# Patient Record
Sex: Female | Born: 1945 | Race: White | Hispanic: No | Smoking: Former smoker
Health system: Southern US, Community
[De-identification: ages and names within clinical notes are randomized; demographics above are authoritative.]

## PROBLEM LIST (undated history)

## (undated) DIAGNOSIS — I471 Supraventricular tachycardia, unspecified: Secondary | ICD-10-CM

## (undated) DIAGNOSIS — M1990 Unspecified osteoarthritis, unspecified site: Secondary | ICD-10-CM

## (undated) DIAGNOSIS — F32A Depression, unspecified: Secondary | ICD-10-CM

## (undated) DIAGNOSIS — F329 Major depressive disorder, single episode, unspecified: Secondary | ICD-10-CM

## (undated) DIAGNOSIS — I509 Heart failure, unspecified: Secondary | ICD-10-CM

## (undated) DIAGNOSIS — K219 Gastro-esophageal reflux disease without esophagitis: Secondary | ICD-10-CM

## (undated) DIAGNOSIS — E785 Hyperlipidemia, unspecified: Secondary | ICD-10-CM

## (undated) DIAGNOSIS — N3281 Overactive bladder: Secondary | ICD-10-CM

## (undated) DIAGNOSIS — E079 Disorder of thyroid, unspecified: Secondary | ICD-10-CM

## (undated) DIAGNOSIS — E559 Vitamin D deficiency, unspecified: Secondary | ICD-10-CM

## (undated) DIAGNOSIS — E1169 Type 2 diabetes mellitus with other specified complication: Secondary | ICD-10-CM

## (undated) DIAGNOSIS — J449 Chronic obstructive pulmonary disease, unspecified: Secondary | ICD-10-CM

## (undated) DIAGNOSIS — I10 Essential (primary) hypertension: Secondary | ICD-10-CM

## (undated) DIAGNOSIS — E669 Obesity, unspecified: Secondary | ICD-10-CM

## (undated) HISTORY — PX: CARPAL TUNNEL RELEASE: SHX101

## (undated) HISTORY — PX: PUBOVAGINAL SLING: SHX1035

## (undated) HISTORY — DX: Unspecified osteoarthritis, unspecified site: M19.90

## (undated) HISTORY — DX: Chronic obstructive pulmonary disease, unspecified: J44.9

## (undated) HISTORY — DX: Essential (primary) hypertension: I10

## (undated) HISTORY — DX: Depression, unspecified: F32.A

## (undated) HISTORY — DX: Gastro-esophageal reflux disease without esophagitis: K21.9

## (undated) HISTORY — DX: Major depressive disorder, single episode, unspecified: F32.9

## (undated) HISTORY — DX: Hyperlipidemia, unspecified: E78.5

## (undated) HISTORY — DX: Vitamin D deficiency, unspecified: E55.9

## (undated) HISTORY — PX: TONSILLECTOMY AND ADENOIDECTOMY: SUR1326

## (undated) HISTORY — PX: EYE SURGERY: SHX253

## (undated) HISTORY — DX: Overactive bladder: N32.81

## (undated) HISTORY — DX: Disorder of thyroid, unspecified: E07.9

## (undated) HISTORY — DX: Supraventricular tachycardia, unspecified: I47.10

## (undated) HISTORY — DX: Supraventricular tachycardia: I47.1

---

## 1998-01-26 ENCOUNTER — Ambulatory Visit (HOSPITAL_COMMUNITY): Admission: RE | Admit: 1998-01-26 | Discharge: 1998-01-26 | Payer: Self-pay | Admitting: *Deleted

## 1998-01-26 ENCOUNTER — Other Ambulatory Visit: Admission: RE | Admit: 1998-01-26 | Discharge: 1998-01-26 | Payer: Self-pay | Admitting: *Deleted

## 1998-05-09 ENCOUNTER — Ambulatory Visit (HOSPITAL_COMMUNITY): Admission: RE | Admit: 1998-05-09 | Discharge: 1998-05-09 | Payer: Self-pay | Admitting: *Deleted

## 1999-02-20 ENCOUNTER — Other Ambulatory Visit: Admission: RE | Admit: 1999-02-20 | Discharge: 1999-02-20 | Payer: Self-pay | Admitting: *Deleted

## 1999-03-22 ENCOUNTER — Other Ambulatory Visit: Admission: RE | Admit: 1999-03-22 | Discharge: 1999-03-22 | Payer: Self-pay | Admitting: *Deleted

## 1999-05-22 ENCOUNTER — Ambulatory Visit (HOSPITAL_COMMUNITY): Admission: RE | Admit: 1999-05-22 | Discharge: 1999-05-22 | Payer: Self-pay | Admitting: *Deleted

## 1999-06-06 ENCOUNTER — Ambulatory Visit (HOSPITAL_COMMUNITY): Admission: RE | Admit: 1999-06-06 | Discharge: 1999-06-06 | Payer: Self-pay | Admitting: *Deleted

## 2000-02-21 ENCOUNTER — Other Ambulatory Visit: Admission: RE | Admit: 2000-02-21 | Discharge: 2000-02-21 | Payer: Self-pay | Admitting: *Deleted

## 2000-06-06 ENCOUNTER — Ambulatory Visit (HOSPITAL_COMMUNITY): Admission: RE | Admit: 2000-06-06 | Discharge: 2000-06-06 | Payer: Self-pay | Admitting: *Deleted

## 2001-06-08 ENCOUNTER — Ambulatory Visit (HOSPITAL_COMMUNITY): Admission: RE | Admit: 2001-06-08 | Discharge: 2001-06-08 | Payer: Self-pay | Admitting: Internal Medicine

## 2001-06-08 ENCOUNTER — Encounter: Payer: Self-pay | Admitting: Internal Medicine

## 2001-06-22 ENCOUNTER — Other Ambulatory Visit: Admission: RE | Admit: 2001-06-22 | Discharge: 2001-06-22 | Payer: Self-pay | Admitting: Gastroenterology

## 2001-06-22 ENCOUNTER — Encounter (INDEPENDENT_AMBULATORY_CARE_PROVIDER_SITE_OTHER): Payer: Self-pay | Admitting: Specialist

## 2002-07-01 ENCOUNTER — Encounter: Payer: Self-pay | Admitting: Internal Medicine

## 2002-07-01 ENCOUNTER — Ambulatory Visit (HOSPITAL_COMMUNITY): Admission: RE | Admit: 2002-07-01 | Discharge: 2002-07-01 | Payer: Self-pay | Admitting: Internal Medicine

## 2003-07-06 ENCOUNTER — Encounter: Payer: Self-pay | Admitting: Internal Medicine

## 2003-07-06 ENCOUNTER — Ambulatory Visit (HOSPITAL_COMMUNITY): Admission: RE | Admit: 2003-07-06 | Discharge: 2003-07-06 | Payer: Self-pay | Admitting: Internal Medicine

## 2004-03-09 ENCOUNTER — Encounter: Admission: RE | Admit: 2004-03-09 | Discharge: 2004-03-09 | Payer: Self-pay | Admitting: Internal Medicine

## 2004-03-14 ENCOUNTER — Encounter: Admission: RE | Admit: 2004-03-14 | Discharge: 2004-03-14 | Payer: Self-pay | Admitting: Internal Medicine

## 2004-03-22 ENCOUNTER — Other Ambulatory Visit: Admission: RE | Admit: 2004-03-22 | Discharge: 2004-03-22 | Payer: Self-pay | Admitting: Gynecology

## 2004-04-04 ENCOUNTER — Ambulatory Visit (HOSPITAL_COMMUNITY): Admission: RE | Admit: 2004-04-04 | Discharge: 2004-04-04 | Payer: Self-pay | Admitting: Gynecology

## 2004-04-18 ENCOUNTER — Ambulatory Visit (HOSPITAL_COMMUNITY): Admission: RE | Admit: 2004-04-18 | Discharge: 2004-04-18 | Payer: Self-pay | Admitting: Gynecology

## 2004-07-06 ENCOUNTER — Ambulatory Visit (HOSPITAL_COMMUNITY): Admission: RE | Admit: 2004-07-06 | Discharge: 2004-07-06 | Payer: Self-pay | Admitting: Internal Medicine

## 2004-08-15 ENCOUNTER — Ambulatory Visit (HOSPITAL_COMMUNITY): Admission: RE | Admit: 2004-08-15 | Discharge: 2004-08-15 | Payer: Self-pay | Admitting: Gynecology

## 2004-09-02 ENCOUNTER — Ambulatory Visit (HOSPITAL_COMMUNITY): Admission: RE | Admit: 2004-09-02 | Discharge: 2004-09-02 | Payer: Self-pay | Admitting: Gynecology

## 2004-09-25 ENCOUNTER — Ambulatory Visit: Admission: RE | Admit: 2004-09-25 | Discharge: 2004-09-25 | Payer: Self-pay | Admitting: Gynecology

## 2005-06-07 ENCOUNTER — Ambulatory Visit (HOSPITAL_COMMUNITY): Admission: RE | Admit: 2005-06-07 | Discharge: 2005-06-07 | Payer: Self-pay | Admitting: Urology

## 2005-06-07 ENCOUNTER — Ambulatory Visit (HOSPITAL_BASED_OUTPATIENT_CLINIC_OR_DEPARTMENT_OTHER): Admission: RE | Admit: 2005-06-07 | Discharge: 2005-06-07 | Payer: Self-pay | Admitting: Urology

## 2005-07-12 ENCOUNTER — Ambulatory Visit (HOSPITAL_COMMUNITY): Admission: RE | Admit: 2005-07-12 | Discharge: 2005-07-12 | Payer: Self-pay | Admitting: Internal Medicine

## 2005-07-22 ENCOUNTER — Encounter: Admission: RE | Admit: 2005-07-22 | Discharge: 2005-07-22 | Payer: Self-pay | Admitting: Internal Medicine

## 2006-07-24 ENCOUNTER — Ambulatory Visit (HOSPITAL_COMMUNITY): Admission: RE | Admit: 2006-07-24 | Discharge: 2006-07-24 | Payer: Self-pay | Admitting: Internal Medicine

## 2006-08-13 ENCOUNTER — Ambulatory Visit: Payer: Self-pay | Admitting: Gastroenterology

## 2006-08-19 ENCOUNTER — Encounter (INDEPENDENT_AMBULATORY_CARE_PROVIDER_SITE_OTHER): Payer: Self-pay | Admitting: *Deleted

## 2006-08-19 ENCOUNTER — Ambulatory Visit: Payer: Self-pay | Admitting: Gastroenterology

## 2007-09-16 ENCOUNTER — Ambulatory Visit (HOSPITAL_COMMUNITY): Admission: RE | Admit: 2007-09-16 | Discharge: 2007-09-16 | Payer: Self-pay | Admitting: Internal Medicine

## 2007-12-21 ENCOUNTER — Other Ambulatory Visit: Admission: RE | Admit: 2007-12-21 | Discharge: 2007-12-21 | Payer: Self-pay | Admitting: Gynecology

## 2007-12-23 ENCOUNTER — Ambulatory Visit (HOSPITAL_COMMUNITY): Admission: RE | Admit: 2007-12-23 | Discharge: 2007-12-23 | Payer: Self-pay | Admitting: Gynecology

## 2008-01-06 ENCOUNTER — Ambulatory Visit (HOSPITAL_COMMUNITY): Admission: RE | Admit: 2008-01-06 | Discharge: 2008-01-06 | Payer: Self-pay | Admitting: Internal Medicine

## 2008-01-07 ENCOUNTER — Ambulatory Visit: Admission: RE | Admit: 2008-01-07 | Discharge: 2008-01-07 | Payer: Self-pay | Admitting: Internal Medicine

## 2008-01-07 ENCOUNTER — Ambulatory Visit: Payer: Self-pay | Admitting: Vascular Surgery

## 2008-01-07 ENCOUNTER — Encounter: Admission: RE | Admit: 2008-01-07 | Discharge: 2008-01-07 | Payer: Self-pay | Admitting: Gynecology

## 2008-01-07 ENCOUNTER — Encounter (INDEPENDENT_AMBULATORY_CARE_PROVIDER_SITE_OTHER): Payer: Self-pay | Admitting: Internal Medicine

## 2008-12-02 ENCOUNTER — Ambulatory Visit (HOSPITAL_COMMUNITY): Admission: RE | Admit: 2008-12-02 | Discharge: 2008-12-02 | Payer: Self-pay | Admitting: Internal Medicine

## 2008-12-15 ENCOUNTER — Encounter: Admission: RE | Admit: 2008-12-15 | Discharge: 2008-12-15 | Payer: Self-pay | Admitting: Internal Medicine

## 2009-08-01 ENCOUNTER — Encounter (INDEPENDENT_AMBULATORY_CARE_PROVIDER_SITE_OTHER): Payer: Self-pay | Admitting: *Deleted

## 2009-10-23 ENCOUNTER — Inpatient Hospital Stay (HOSPITAL_COMMUNITY): Admission: EM | Admit: 2009-10-23 | Discharge: 2009-10-28 | Payer: Self-pay | Admitting: Emergency Medicine

## 2009-10-23 ENCOUNTER — Ambulatory Visit: Payer: Self-pay | Admitting: Pulmonary Disease

## 2009-10-23 ENCOUNTER — Encounter (INDEPENDENT_AMBULATORY_CARE_PROVIDER_SITE_OTHER): Payer: Self-pay | Admitting: Internal Medicine

## 2010-03-29 ENCOUNTER — Telehealth: Payer: Self-pay | Admitting: Gastroenterology

## 2010-04-06 ENCOUNTER — Encounter (INDEPENDENT_AMBULATORY_CARE_PROVIDER_SITE_OTHER): Payer: Self-pay | Admitting: *Deleted

## 2010-07-11 ENCOUNTER — Ambulatory Visit: Payer: Self-pay | Admitting: Gastroenterology

## 2010-07-11 DIAGNOSIS — Z86010 Personal history of colonic polyps: Secondary | ICD-10-CM

## 2010-07-11 DIAGNOSIS — D509 Iron deficiency anemia, unspecified: Secondary | ICD-10-CM

## 2010-07-25 ENCOUNTER — Telehealth: Payer: Self-pay | Admitting: Gastroenterology

## 2010-08-16 ENCOUNTER — Encounter: Payer: Self-pay | Admitting: Gastroenterology

## 2010-08-16 ENCOUNTER — Ambulatory Visit (HOSPITAL_COMMUNITY)
Admission: RE | Admit: 2010-08-16 | Discharge: 2010-08-16 | Payer: Self-pay | Source: Home / Self Care | Admitting: Gastroenterology

## 2010-08-16 LAB — HM COLONOSCOPY

## 2010-09-11 ENCOUNTER — Ambulatory Visit
Admission: RE | Admit: 2010-09-11 | Discharge: 2010-09-11 | Payer: Self-pay | Source: Home / Self Care | Attending: Gastroenterology | Admitting: Gastroenterology

## 2010-09-11 LAB — CONVERTED CEMR LAB
OCCULT 3: NEGATIVE
OCCULT 4: NEGATIVE
OCCULT 5: NEGATIVE

## 2010-09-17 ENCOUNTER — Encounter: Payer: Self-pay | Admitting: Gastroenterology

## 2010-10-01 ENCOUNTER — Ambulatory Visit: Admit: 2010-10-01 | Payer: Self-pay | Admitting: Gastroenterology

## 2010-10-01 ENCOUNTER — Telehealth: Payer: Self-pay | Admitting: Gastroenterology

## 2010-10-06 ENCOUNTER — Encounter: Payer: Self-pay | Admitting: Internal Medicine

## 2010-10-07 ENCOUNTER — Encounter: Payer: Self-pay | Admitting: Gynecology

## 2010-10-07 ENCOUNTER — Encounter: Payer: Self-pay | Admitting: Internal Medicine

## 2010-10-11 ENCOUNTER — Encounter: Payer: Self-pay | Admitting: Gastroenterology

## 2010-10-16 NOTE — Progress Notes (Signed)
Summary: Patient Sick  Phone Note Call from Patient Call back at Home Phone [phone]   Caller: Patient Call For: Dr. Arlyce Dice Reason for Call: Talk to Nurse Details for Reason: Pt. Sick Summary of Call: Patient has procedure scheduled for tomorrow at [facility].  On antibiotics, running a fever. Needs to know if she should still proceed or reschedule to another time.  Please call. Initial call taken by: Schuyler Amor,  July 25, 2010 10:58 AM  Follow-up for Phone Call        Patient  running fever 101 today. HA, sinus congestions.  She is on home oxygen and in a wheelchair.  States she feels much worse than she did yesterday.  She is scheculed for a endo/colon tomorrow at the hospital.  She wants to know if this needs to be rescheduled.  She is wiling to do the procedures but wants to make sure you are ok with it.  Dr Arlyce Dice please advise Follow-up by: Darcey Nora RN, CGRN,  July 25, 2010 11:06 AM  Additional Follow-up for Phone Call Additional follow up Details #1::        reschedule for a later time Additional Follow-up by: Louis Meckel MD,  July 25, 2010 4:41 PM    Additional Follow-up for Phone Call Additional follow up Details #2::    Patient  is rescheduled to 08/16/10 12:30 Follow-up by: Darcey Nora RN, CGRN,  July 25, 2010 4:51 PM

## 2010-10-16 NOTE — Procedures (Signed)
Summary: Colonoscopy   Colonoscopy  Procedure date:  08/19/2006  Findings:      Results: Polyp.  Results: Diverticulosis.       Location:  [facility].   Patient Name: Jillian Wood, Jillian Wood MRN:  Procedure Procedures: Colonoscopy CPT: 16109.    with Hot Biopsy(s)CPT: Z451292.    with polypectomy. CPT: A3573898.  Personnel: Endoscopist: Barbette Hair. Arlyce Dice, MD.  Patient Consent: Procedure, Alternatives, Risks and Benefits discussed, consent obtained, from patient.  Indications  Surveillance of: Adenomatous Polyp(s). 2002.  History  Current Medications: Patient is not currently taking Coumadin.  Allergies: Allergic to                            .  Pre-Exam Physical: Performed Aug 19, 2006. Cardio-pulmonary exam, HEENT exam , Abdominal exam, Mental status exam WNL.  Comments: Patient history reviewed/updated, physical performed prior to initiation of sedation?yes Exam Exam: Extent of exam reached: Cecum, extent intended: Cecum.  The cecum was identified by appendiceal orifice and IC valve. Time to Cecum: 00:04: 09. Time for Withdrawl: 00:22:49. Colon retroflexion performed. ASA Classification: II. Tolerance: good.  Monitoring: Pulse and BP monitoring, Oximetry used. Supplemental O2 given. at 2 Liters.  Colon Prep Used Miralax for colon prep. Prep results: fair, adequate exam.  Sedation Meds: Patient assessed and found to be appropriate for moderate (conscious) sedation. Sedation was managed by the Endoscopist. Fentanyl 75 mcg. given IV. Versed 9 mg. given IV. Benadryl 25 given IV.  Findings POLYP: Transverse Colon, Maximum size: 12 mm. sessile polyp. Procedure:  snare with cautery/saline, Polyp sent to pathology. ICD9: Colon Polyps: 211.3.  POLYP: Descending Colon, Maximum size: 5 mm. sessile polyp. Procedure:  snare with cautery/saline, sent to pathology. ICD9: Colon Polyps: 211.3.  - DIVERTICULOSIS: Descending Colon to Sigmoid  Colon. ICD9: Diverticulosis: 562.10. Comments: Scattered diverticula.  POLYP: Descending Colon, Maximum size: 5 mm. sessile polyp. Procedure:  snare with cautery/saline, sent to pathology. ICD9: Colon Polyps: 211.3.  POLYP: Descending Colon, sessile polyp. Procedure:  snare with cautery/saline, sent to pathology. ICD9: Colon Polyps: 211.3.  NORMAL EXAM: Cecum.  - MULTIPLE POLYPS: Sigmoid Colon to Rectum. minimum size 2 mm, maximum size 3 mm. Procedure:  hot biopsy, Comments: Multiple diminutive polyps.  2 were removed and submitted to r/o adenomatous versus hyperplastic changes.  NORMAL EXAM: Rectum.   Assessment Abnormal examination, see findings above.  Diagnoses: 211.3: Colon Polyps.  562.10: Diverticulosis.   Events  Unplanned Interventions: No intervention was required.  Unplanned Events: There were no complications. Plans  Post Exam Instructions: Post sedation instructions given.  Patient Education: Patient given standard instructions for: Polyps. Diverticulosis.  Disposition: After procedure patient sent to recovery. After recovery patient sent home.  Scheduling/Referral: Colonoscopy, to Barbette Hair. Arlyce Dice, MD, around Aug 19, 2009.    This report was created from the original endoscopy report, which was reviewed and signed by the above listed endoscopist.

## 2010-10-16 NOTE — Procedures (Signed)
Summary: Colonoscopy  Patient: Jillian Wood Note: All result statuses are Final unless otherwise noted.  Tests: (1) Colonoscopy (COL)   COL Colonoscopy           DONE (C)     [facility]     [address] [place], [place]  [address]           COLONOSCOPY PROCEDURE REPORT           PATIENT:  Jillian Wood, Jillian Wood  MR#:  [id number]     BIRTHDATE:  [birth year], 64 yrs. old  GENDER:  female           ENDOSCOPIST:  Barbette Hair. Arlyce Dice, MD     Referred by:           PROCEDURE DATE:  08/16/2010     PROCEDURE:  Colonoscopy with snare polypectomy     ASA CLASS:  Class III     INDICATIONS:  1) Iron deficiency anemia           MEDICATIONS:   Fentanyl 60 mcg IV, Versed 6 mg IV, Benadryl 25 mg     IV           DESCRIPTION OF PROCEDURE:   After the risks benefits and     alternatives of the procedure were thoroughly explained, informed     consent was obtained.  Digital rectal exam was performed and     revealed no abnormalities.   The Pentax Colonoscope [id number]     endoscope was introduced through the anus and advanced to the     cecum, which was identified by the ileocecal valve, without     limitations.  The quality of the prep was excellent, using     MoviPrep.  The instrument was then slowly withdrawn as the colon     was fully examined.     <<PROCEDUREIMAGES>>           FINDINGS:  Two polyps were found in the cecum (see image002 and     image003). 8mm and 3mm sessile polyps - removed via cold     polypectomy snare and submitted to pathologn  A sessile polyp was     found in the sigmoid colon. It was 1 cm in size. It was found 22     cm from the point of entry. Polyps were snared, then cauterized     with monopolar cautery. Retrieval was successful (see image013).     snare polyp  Moderate diverticulosis was found in the sigmoid     colon (see image001).  This was otherwise a normal examination of     the colon (see image004, image005, image006, image008, and  image015).   Retroflexed views in the rectum revealed no     abnormalities.    The time to cecum =  minutes. The scope was then     withdrawn (time =  10.75  min) from the patient and the procedure     completed.           COMPLICATIONS:  None           ENDOSCOPIC IMPRESSION:     1) Two polyps in the cecum     2) 1 cm sessile polyp in the sigmoid colon     3) Moderate diverticulosis in the sigmoid colon     4) Otherwise normal examination           No source of bleeding seen in colon  RECOMMENDATIONS:     1) If the polyp(s) removed today are proven to be adenomatous     (pre-cancerous) polyps, you will need a repeat colonoscopy in 5     years. Otherwise you should continue to follow colorectal cancer     screening guidelines for "routine risk" patients with colonoscopy     in 10 years.           REPEAT EXAM:   You will receive a letter from Dr. Arlyce Dice in 1-2     weeks, after reviewing the final pathology, with followup     recommendations.           ______________________________     Barbette Hair Arlyce Dice, MD           cc: Lucky Cowboy, MD           CC:           n.     REVISED:  08/16/2010 01:33 PM     eSIGNED:   Barbette Hair. Kaplan at 08/16/2010 01:33 PM           Jillian Wood, [id number]  Note: An exclamation mark (!) indicates a result that was not dispersed into the flowsheet. Document Creation Date: 08/16/2010 1:34 PM _______________________________________________________________________  (1) Order result status: Final Collection or observation date-time: 08/16/2010 13:12 Requested date-time:  Receipt date-time:  Reported date-time:  Referring Physician:   Ordering Physician: Melvia Heaps [phone]) Specimen Source:  Source: Launa Grill Order Number: [id number] Lab site:

## 2010-10-16 NOTE — Miscellaneous (Signed)
  Clinical Lists Changes  Medications: Added new medication of PEPCID 20 MG TABS (FAMOTIDINE) take 1 tab once daily - Signed Rx of PEPCID 20 MG TABS (FAMOTIDINE) take 1 tab once daily;  #30 x 2;  Signed;  Entered by: Louis Meckel MD;  Authorized by: Louis Meckel MD;  Method used: Electronically [facility]  [address] [phone]*, [address] [place], [place], [place]  [address], Ph: [phone] or [phone], Fax: [phone]    Prescriptions: PEPCID 20 MG TABS (FAMOTIDINE) take 1 tab once daily  #30 x 2   Entered and Authorized by:   Louis Meckel MD   Signed by:   Louis Meckel MD on 08/16/2010   Method used:   [facility]        [facility]  [address] [phone]* (retail)       [address]       [place], [place]  [address]       Ph: [phone] or [phone]       Fax: [phone]   RxID:   [id number]

## 2010-10-16 NOTE — Letter (Signed)
Summary: New Patient letter  [facility]  [address] [place], [place] [address]   Phone: [phone]  Fax: [phone]       04/06/2010 MRN: [id number]  Jillian Wood [address] [place], [place]  [address]  Dear Jillian Wood,  Welcome to the Gastroenterology Division at [facility].    You are scheduled to see Dr. Arlyce Dice on 05/28/2010 at 3:45pm on the 3rd floor at [facility], [address]  We ask that you try to arrive at our office 15 minutes prior to your appointment time to allow for check-in.  We would like you to complete the enclosed self-administered evaluation form prior to your visit and bring it with you on the day of your appointment.  We will review it with you.  Also, please bring a complete list of all your medications or, if you prefer, bring the medication bottles and we will list them.  Please bring your insurance card so that we may make a copy of it.  If your insurance requires a referral to see a specialist, please bring your referral form from your primary care physician.  Co-payments are due at the time of your visit and may be paid by cash, check or credit card.     Your office visit will consist of a consult with your physician (includes a physical exam), any laboratory testing he/she may order, scheduling of any necessary diagnostic testing (e.g. x-ray, ultrasound, CT-scan), and scheduling of a procedure (e.g. Endoscopy, Colonoscopy) if required.  Please allow enough time on your schedule to allow for any/all of these possibilities.    If you cannot keep your appointment, please call [phone] to cancel or reschedule prior to your appointment date.  This allows [place] the opportunity to schedule an appointment for another patient in need of care.  If you do not cancel or reschedule by 5 p.m. the business day prior to your appointment date, you will be charged a $50.00 late cancellation/no-show fee.    Thank you for  choosing [facility] for your medical needs.  We appreciate the opportunity to care for you.  Please visit [place] at our website  to learn more about our practice.                     Sincerely,                                                             The Gastroenterology Division

## 2010-10-16 NOTE — Assessment & Plan Note (Signed)
Summary: discuss colon/on o2 and mobility issue/lk   History of Present Illness Visit Type: Initial Visit Primary GI MD: Melvia Heaps MD [facility] Requesting Shray Hunley: n/a Chief Complaint: Discuss colonoscopy and motility issues History of Present Illness:   Jillian Wood is a pleasant 65 year old white female referred at the request of  Lucky Cowboy, M.D. for evaluation of anemia.  She has a history of adenomatous colon polyps.  Last exam in 2007 demonstrated a 5 mm adenoma which was removed.  Diverticulosis was also seen in the left colon.  She has no GI complaints including change of bowel habits, abdominal pain, melena or hematochezia.  Approximately1  month ago blood work demonstrated a mild anemia.  She was placed on a multivitamin contains iron.  2 days ago hemoglobin was 12.5, serum iron 75, TIBC 425 and percent saturation 18.  She apparently tested Hemoccult negative.  She is on no gastric irritants including nonsteroidals.  Patient has oxygen-dependent COPD.  She is nonambulatory.    GI Review of Systems      Denies abdominal pain, acid reflux, belching, bloating, chest pain, dysphagia with liquids, dysphagia with solids, heartburn, loss of appetite, nausea, vomiting, vomiting blood, weight loss, and  weight gain.        Denies anal fissure, black tarry stools, change in bowel habit, constipation, diarrhea, diverticulosis, fecal incontinence, heme positive stool, hemorrhoids, irritable bowel syndrome, jaundice, light color stool, liver problems, rectal bleeding, and  rectal pain. Preventive Screening-Counseling & Management  Alcohol-Tobacco     Smoking Status: quit    Current Medications (verified): 1)  Albuterol Sulfate (2.5 Mg/46ml) 0.083% Nebu (Albuterol Sulfate) .... Every 6 Hours 2)  Furosemide 20 Mg Tabs (Furosemide) .Marland Kitchen.. 1 By Mouth Once Daily 3)  Altarussin Dm 100-10 Mg/16ml Syrp (Dextromethorphan-Guaifenesin) .... Daily 4)  Ipratropium-Albuterol 0.5-2.5 (3) Mg/21ml Soln  (Ipratropium-Albuterol) .... Every 6 Hours As Needed 5)  Amlodipine Besylate 10 Mg Tabs (Amlodipine Besylate) .Marland Kitchen.. 1 By Mouth Once Daily 6)  Aspirin 81 Mg Tbec (Aspirin) .Marland Kitchen.. 1 By Mouth Once Daily 7)  Calcium 1200-1000 Mg-Unit Chew (Calcium Carbonate-Vit D-Min) .... Once Daily 8)  Levothyroxine Sodium 200 Mcg Tabs (Levothyroxine Sodium) .... Once Daily 9)  Multivitamins  Tabs (Multiple Vitamin) .Marland Kitchen.. 1 By Mouth Once Daily 10)  Pravachol 40 Mg Tabs (Pravastatin Sodium) .Marland Kitchen.. 1 By Mouth Once Daily 11)  Vitamin D (Ergocalciferol) 50000 Unit Caps (Ergocalciferol) .Marland Kitchen.. 1 By Mouth Once Daily 12)  Cymbalta 60 Mg Cpep (Duloxetine Hcl) .... Take 1 Tab Daily 13)  Magnesium 250 Mg Tabs (Magnesium) .... Take 1 Tab Daily 14)  Metformin Hcl 500 Mg Tabs (Metformin Hcl) .... Take 1 Tab Twice Daily 15)  Hydrocodone-Acetaminophen 7.5-500 Mg/41ml Soln (Hydrocodone-Acetaminophen) .... Take 1 Every 6 Hours For Pain  Allergies (verified): No Known Drug Allergies  Past History:  Past Medical History: Reviewed history from 05/25/2010 and no changes required. Diverticulosis Tubular Adenoma  2007 Hyperplastic Polyp  2007 Hypertension Hyperthyroidism tobacco abuse acute renal failure hyperglycemia/pre-diabetic  Past Surgical History: Tonsillectomy Carpel Tunnel surgery, RT wrist and LT wrist, 1980's  Family History: Grandmother and Aunt- Breast Cancer  Social History:  Occupation: Disabled Patient is a former smoker.  Daily Caffeine Use  Married Smoking Status:  quit  Review of Systems       The patient complains of arthritis/joint pain and back pain.  The patient denies blood in urine, breast changes/lumps, change in vision, confusion, cough, coughing up blood, depression-new, fainting, fatigue, fever, headaches-new, hearing problems, heart murmur, heart rhythm changes,  itching, menstrual pain, muscle pains/cramps, night sweats, nosebleeds, pregnancy symptoms, shortness of breath, skin rash,  sleeping problems, sore throat, swelling of feet/legs, swollen lymph glands, thirst - excessive , urination - excessive , urination changes/pain, urine leakage, vision changes, and voice change.         Difficulty walking.  All other systems were reviewed and were negative   Vital Signs:  Patient profile:   65 year old female Height:      65 inches Weight:      322 pounds BMI:     53.78 Pulse rate:   88 / minute BP sitting:   142 / 74  (right arm)  Vitals Entered By: Lowry Ram NCMA (July 11, 2010 1:55 PM)  Physical Exam  Additional Exam:  On physical exam she has a obese female is salmon while sitting in a wheelchair  skin: anicteric HEENT: normocephalic; PEERLA; no nasal or pharyngeal abnormalities neck: supple nodes: no cervical lymphadenopathy chest: clear to ausculatation and percussion heart: no gallops, or rubs; there is a 1-2/6 early systolic murmur heard at the left sternal border abd: soft, nontender; BS normoactive; no abdominal masses, tenderness, organomegaly rectal: deferred ext: no cynanosis, clubbing, edema skeletal: no deformities neuro: oriented x 3; no focal abnormalities    Impression & Recommendations:  Problem # 1:  IRON DEFICIENCY (ICD-280.9)  Iron studies are suggestive of iron deficiency.  It is noteworthy that these were checked after she started a multivitamin containing iron.  A chronic GI bleeding source should be ruled out.  Recommendations #1 colonoscopy and upper endoscopy has to be done at the same time  Risks, alternatives, and complications of the procedure, including bleeding, perforation, and possible need for surgery, were explained to the patient.  Patient's questions were answered.  Orders: [facility] (Col/End [place])  Problem # 2:  COPD (ICD-496) The patient has oxygen-dependent but stable COPD  Problem # 3:  PERSONAL HISTORY OF COLONIC POLYPS (ICD-V12.72)  Plan followup colonoscopy  Orders: [facility] (Col/End [place])  Patient Instructions: 1)  Your Colonoscopy/Endo is scheduled for 07/26/2010 at 12:30pm 2)  The medication list was reviewed and reconciled.  All changed / newly prescribed medications were explained.  A complete medication list was provided to the patient / caregiver. Prescriptions: MOVIPREP 100 GM  SOLR (PEG-KCL-NACL-NASULF-NA ASC-C) As per prep instructions.  #1 x 0   Entered by:   Merri Ray CMA (AAMA)   Authorized by:   Louis Meckel MD   Signed by:   Merri Ray CMA (AAMA) on 07/11/2010   Method used:   [facility]  [address] [phone][facility]ail)       [address]       [place], [place]  [address]       Ph: [phone] or [phone]       Fax: [phone]   RxID:   [id number]

## 2010-10-16 NOTE — Procedures (Signed)
Summary: Upper Endoscopy  Patient: Jillian Wood Note: All result statuses are Final unless otherwise noted.  Tests: (1) Upper Endoscopy (EGD)   EGD Upper Endoscopy       DONE     [facility]     [address] [place], [place]  [address]           ENDOSCOPY PROCEDURE REPORT           PATIENT:  Jillian, Wood  MR#:  [id number]     BIRTHDATE:  1946-07-17, 64 yrs. old  GENDER:  female           ENDOSCOPIST:  Barbette Hair. Arlyce Dice, MD     Referred by:           PROCEDURE DATE:  08/16/2010     PROCEDURE:  EGD with biopsy, [id number]     ASA CLASS:  Class III     INDICATIONS:  iron deficiency anemia           MEDICATIONS:   There was residual sedation effect present from     prior procedure., Versed 2.5 mg IV, Fentanyl 25 mcg IV,     glycopyrrolate (Robinal) 0.2 mg IV     TOPICAL ANESTHETIC:  Exactacain Spray           DESCRIPTION OF PROCEDURE:   After the risks benefits and     alternatives of the procedure were thoroughly explained, informed     consent was obtained.  The Pentax Gastroscope [id number] endoscope     was introduced through the mouth and advanced to the third portion     of the duodenum, without limitations.  The instrument was slowly     withdrawn as the mucosa was fully examined.     <<PROCEDUREIMAGES>>           Multiple erosions were found in the bulb and descending duodenum.     Multiple superficial erosions/erythema in bulb and 2nd portions.     Bx taken (see image001 and image002).  Otherwise the examination     was normal.    Retroflexed views revealed no abnormalities.    The     scope was then withdrawn from the patient and the procedure     completed.           COMPLICATIONS:  None           ENDOSCOPIC IMPRESSION:     1) Erosions, multiple in the bulb/descending duodenum     2) Otherwise normal examination           Erosive changes may account for iron deficiency anemia           RECOMMENDATIONS:     1) Begin Pepcid 20 mg qd     2) Call  office next 2-3 days to schedule an office appointment     for 4-6 weeks     3) hemmoccult stools 1 month     4) CBC 4-6 weeks           REPEAT EXAM:  No           ______________________________     Barbette Hair. Arlyce Dice, MD           CC:  Jillian Cowboy, MD           n.     Rosalie Doctor:   Barbette Hair. Kaplan at 08/16/2010 01:32 PM           [name], [id number]  Note: An exclamation mark (!) indicates a result that was not dispersed into the flowsheet. Document Creation Date: 08/16/2010 1:32 PM _______________________________________________________________________  (1) Order result status: Final Collection or observation date-time: 08/16/2010 13:25 Requested date-time:  Receipt date-time:  Reported date-time:  Referring Physician:   Ordering Physician: Melvia Heaps [phone]) Specimen Source:  Source: Launa Grill Order Number: [id number] Lab site:   Appended Document: Upper Endoscopy PT HAS AN APPOINTMENT TO F/U ON 10/01/2010 AND ORDERS IN FOR CBC ON SAME DAY WILL MAIL OUT HEMOCCULTS TO PT TODAY

## 2010-10-16 NOTE — Procedures (Signed)
Summary: Instructions for procedure/[place]  Instructions for procedure/[place]   Imported By: Sherian Rein 07/13/2010 14:23:52  _____________________________________________________________________  External Attachment:    Type:   Image     Comment:   External Document

## 2010-10-16 NOTE — Letter (Signed)
Summary: Diabetic Instructions  [facility]  [address] [place], [place] [address]   Phone: [phone]  Fax: [phone]    Jillian Wood [birth year] MRN: [id number]   x    ORAL DIABETIC MEDICATION INSTRUCTIONS  The day before your procedure:   Take your diabetic pill as you do normally  The day of your procedure:   Do not take your diabetic pill    We will check your blood sugar levels during the admission process and again in Recovery before discharging you home  ________________________________________________________________________  _  _   INSULIN (LONG ACTING) MEDICATION INSTRUCTIONS (Lantus, NPH, 70/30, Humulin, Novolin-N)   The day before your procedure:   Take  your regular evening dose    The day of your procedure:   Do not take your morning dose    _  _   INSULIN (SHORT ACTING) MEDICATION INSTRUCTIONS (Regular, Humulog, Novolog)   The day before your procedure:   Do not take your evening dose   The day of your procedure:   Do not take your morning dose   _  _   INSULIN PUMP MEDICATION INSTRUCTIONS  We will contact the physician managing your diabetic care for written dosage instructions for the day before your procedure and the day of your procedure.  Once we have received the instructions, we will contact you.

## 2010-10-16 NOTE — Progress Notes (Signed)
Summary: Schedule Colonoscopy  Phone Note Outgoing Call Call back at Home Phone [phone]   Call placed by: Harlow Mares CMA Duncan Dull),  March 29, 2010 2:33 PM Call placed to: Patient Summary of Call: No Answer, patient is due for a colonoscopy. I will try to call her back about scheduling. Initial call taken by: Harlow Mares CMA Duncan Dull),  March 29, 2010 3:00 PM  Follow-up for Phone Call        patient schedule ov with kaplan 05/28/2010. Follow-up by: Harlow Mares CMA Duncan Dull),  April 06, 2010 3:55 PM

## 2010-10-16 NOTE — Letter (Signed)
Summary: [facility] Instructions  [facility]  [address] [place], [place] [address]   Phone: [phone]  Fax: [phone]       DAISIA SLOMSKI    [birth year]    MRN: [id number]        Procedure Day /Date:THURSDAY 07/26/2010     Arrival Time:11:30AM     Procedure Time:12:30PM     Location of Procedure:                     X   [facility] ( Outpatient Registration)   PREPARATION FOR COLONOSCOPY WITH MOVIPREP   Starting 5 days prior to your procedure 07/21/2010 do not eat nuts, seeds, popcorn, corn, beans, peas,  salads, or any raw vegetables.  Do not take any fiber supplements (e.g. Metamucil, Citrucel, and Benefiber).  THE DAY BEFORE YOUR PROCEDURE         DATE: 07/25/2010  DAY:WED  1.  Drink clear liquids the entire day-NO SOLID FOOD  2.  Do not drink anything colored red or purple.  Avoid juices with pulp.  No orange juice.  3.  Drink at least 64 oz. (8 glasses) of fluid/clear liquids during the day to prevent dehydration and help the prep work efficiently.  CLEAR LIQUIDS INCLUDE: Water Jello Ice Popsicles Tea (sugar ok, no milk/cream) Powdered fruit flavored drinks Coffee (sugar ok, no milk/cream) Gatorade Juice: apple, white grape, white cranberry  Lemonade Clear bullion, consomm, broth Carbonated beverages (any kind) Strained chicken noodle soup Hard Candy                             4.  In the morning, mix first dose of MoviPrep solution:    Empty 1 Pouch A and 1 Pouch B into the disposable container    Add lukewarm drinking water to the top line of the container. Mix to dissolve    Refrigerate (mixed solution should be used within 24 hrs)  5.  Begin drinking the prep at 5:00 p.m. The MoviPrep container is divided by 4 marks.   Every 15 minutes drink the solution down to the next mark (approximately 8 oz) until the full liter is complete.   6.  Follow completed prep with 16 oz of clear liquid of your choice (Nothing red or  purple).  Continue to drink clear liquids until bedtime.  7.  Before going to bed, mix second dose of MoviPrep solution:    Empty 1 Pouch A and 1 Pouch B into the disposable container    Add lukewarm drinking water to the top line of the container. Mix to dissolve    Refrigerate  THE DAY OF YOUR PROCEDURE      DATE:07/26/2010 DAY: THURSDAY  Beginning at 7:30AMa.m. (5 hours before procedure):         1. Every 15 minutes, drink the solution down to the next mark (approx 8 oz) until the full liter is complete.  2. Follow completed prep with 16 oz. of clear liquid of your choice.    3. You may drink clear liquids until 8:30AM(2 HOURS BEFORE PROCEDURE).   MEDICATION INSTRUCTIONS  Unless otherwise instructed, you should take regular prescription medications with a small sip of water   as early as possible the morning of your procedure.        OTHER INSTRUCTIONS  You will need a responsible adult at least 65 years of age to accompany you and drive  you home.   This person must remain in the waiting room during your procedure.  Wear loose fitting clothing that is easily removed.  Leave jewelry and other valuables at home.  However, you may wish to bring a book to read or  an iPod/MP3 player to listen to music as you wait for your procedure to start.  Remove all body piercing jewelry and leave at home.  Total time from sign-in until discharge is approximately 2-3 hours.  You should go home directly after your procedure and rest.  You can resume normal activities the  day after your procedure.  The day of your procedure you should not:   Drive   Make legal decisions   Operate machinery   Drink alcohol   Return to work  You will receive specific instructions about eating, activities and medications before you leave.    The above instructions have been reviewed and explained to me by   _______________________    I fully understand and can verbalize these  instructions _____________________________ Date _________

## 2010-10-18 NOTE — Letter (Signed)
Summary: Results Letter  [facility]  [address] [place], [place] [address]   Phone: [phone]  Fax: [phone]        September 17, 2010 MRN: [id number]    [facility] [address] [place], [place]  [address]    Dear Ms. Alfredo Bach,  Your hemeoccult cards testing for microscopic bleeding were negative.  No further GI workup is required at this time.   Please continue with the recommendations that we previously discussed. Should you have any further questions or concerns, feel free to contact me.    Sincerely,  Barbette Hair. Arlyce Dice, M.D., [facility]  cc Lucky Cowboy, M.D.         Sincerely,  Louis Meckel MD  This letter has been electronically signed by your physician.  Appended Document: Results Letter letter mailed

## 2010-10-18 NOTE — Progress Notes (Signed)
Summary: Pt r/s'd appointment  ---- Converted from flag ---- ---- 10/01/2010 10:27 AM, Karna Christmas wrote: pt. r/s her appt. until 11-02-10 b/c she does not have a driver and is in a wheelchair. ------------------------------ Noted, No Charge

## 2010-11-02 ENCOUNTER — Encounter: Payer: Self-pay | Admitting: Gastroenterology

## 2010-11-02 ENCOUNTER — Ambulatory Visit (INDEPENDENT_AMBULATORY_CARE_PROVIDER_SITE_OTHER): Payer: 59 | Admitting: Gastroenterology

## 2010-11-02 DIAGNOSIS — D126 Benign neoplasm of colon, unspecified: Secondary | ICD-10-CM

## 2010-11-07 NOTE — Assessment & Plan Note (Signed)
Summary: Jillian Wood W PT   History of Present Illness Visit Type: Follow-up Visit Primary GI MD: Melvia Heaps MD [facility] Primary Provider: Lucky Cowboy, MD Requesting Provider: n/a Chief Complaint: Patient here for f/u after endoscopy/colonoscopy. She states that she is currently doing well and a blood count done 2 weeks ago with Dr Oneta Rack was normal. History of Present Illness:    Jillian Wood has returned following upper and lower endoscopy. The former demonstrated erosions that I felt was a possible source for GI blood loss. Nonbleeding adenomatous polyps  were removed in the cecum and a hyperplastic polyp was removed from the sigmoid. Followup Hemoccults were negative. She has no GI complaints.   GI Review of Systems      Denies abdominal pain, acid reflux, belching, bloating, chest pain, dysphagia with liquids, dysphagia with solids, heartburn, loss of appetite, nausea, vomiting, vomiting blood, weight loss, and  weight gain.      Reports diverticulosis.     Denies anal fissure, black tarry stools, change in bowel habit, constipation, diarrhea, fecal incontinence, heme positive stool, hemorrhoids, irritable bowel syndrome, jaundice, light color stool, liver problems, rectal bleeding, and  rectal pain. Preventive Screening-Counseling & Management  Caffeine-Diet-Exercise     Does Patient Exercise: yes    Current Medications (verified): 1)  Albuterol Sulfate (2.5 Mg/81ml) 0.083% Nebu (Albuterol Sulfate) .... Every 6 Hours 2)  Furosemide 20 Mg Tabs (Furosemide) .Marland Kitchen.. 1 By Mouth Once Daily 3)  Ipratropium-Albuterol 0.5-2.5 (3) Mg/84ml Soln (Ipratropium-Albuterol) .... Every 6 Hours As Needed 4)  Amlodipine Besylate 10 Mg Tabs (Amlodipine Besylate) .Marland Kitchen.. 1 By Mouth Once Daily 5)  Aspirin 81 Mg Tbec (Aspirin) .Marland Kitchen.. 1 By Mouth Once Daily 6)  Calcium 1200-1000 Mg-Unit Chew (Calcium Carbonate-Vit D-Min) .... Once Daily 7)  Levothyroxine Sodium 200 Mcg Tabs (Levothyroxine Sodium)  .... Once Daily 8)  Multivitamins  Tabs (Multiple Vitamin) .Marland Kitchen.. 1 By Mouth Once Daily 9)  Pravachol 40 Mg Tabs (Pravastatin Sodium) .Marland Kitchen.. 1 By Mouth Once Daily 10)  Vitamin D (Ergocalciferol) 50000 Unit Caps (Ergocalciferol) .Marland Kitchen.. 1 By Mouth Once Daily 11)  Cymbalta 60 Mg Cpep (Duloxetine Hcl) .... Take 1 Tab Daily 12)  Magnesium 250 Mg Tabs (Magnesium) .... Take 1 Tab Daily 13)  Metformin Hcl 500 Mg Tabs (Metformin Hcl) .... Take 1 Tab Twice Daily 14)  Hydrocodone-Acetaminophen 7.5-500 Mg/53ml Soln (Hydrocodone-Acetaminophen) .... Take 1 Every 6 Hours For Pain 15)  Pepcid 20 Mg Tabs (Famotidine) .... Take 1 Tab Once Daily  Allergies (verified): No Known Drug Allergies  Past History:  Past Medical History: Reviewed history from 05/25/2010 and no changes required. Diverticulosis Tubular Adenoma  2007 Hyperplastic Polyp  2007 Hypertension Hyperthyroidism tobacco abuse acute renal failure hyperglycemia/pre-diabetic  Past Surgical History: Tonsillectomy Carpel Tunnel surgery, RT wrist and LT wrist, 1980's Bladder Sling  Family History: Grandmother and Aunt- Breast Cancer No FH of Colon Cancer: Family History of Heart Disease: Father  Social History: Occupation: Disabled Patient is a former smoker. -stopped 10-18-09 Daily Caffeine Use-4 cups daily Married Patient gets regular exercise.-works arms and legs Does Patient Exercise:  yes  Review of Systems       The patient complains of anemia, arthritis/joint pain, back pain, change in vision, nosebleeds, shortness of breath, swelling of feet/legs, and urine leakage.  The patient denies allergy/sinus, anxiety-new, blood in urine, breast changes/lumps, confusion, cough, coughing up blood, depression-new, fainting, fatigue, fever, headaches-new, hearing problems, heart murmur, heart rhythm changes, itching, menstrual pain, muscle pains/cramps, night sweats, pregnancy symptoms, sleeping  problems, sore throat, thirst - excessive ,  urination - excessive , urination changes/pain, vision changes, and voice change.    Vital Signs:  Patient profile:   65 year old female Height:      65 inches Weight:      309 pounds BMI:     51.61 BSA:     2.38 Pulse rate:   80 / minute Pulse rhythm:   regular BP sitting:   140 / 78  (left arm) Cuff size:   large  Vitals Entered By: Lamona Curl CMA (AAMA) (November 02, 2010 10:25 AM)   Impression & Recommendations:  Problem # 1:  PERSONAL HISTORY OF COLONIC POLYPS (ICD-V12.72)   Followup colonoscopy in 5 years. she will require propofol because of her COPD.  Problem # 2:  IRON DEFICIENCY (ICD-280.9)  This is probably due to bleeding from erosive gastritis. She's been treated and followup Hemoccults were negative. Plan no further GI workup  Problem # 3:  COPD (ICD-496) Assessment: Comment Only  Patient Instructions: 1)  Copy sent to : Lucky Cowboy, MD 2)  Recall for colonoscopy is for 5 years 3)  Follow up as needed  4)  The medication list was reviewed and reconciled.  All changed / newly prescribed medications were explained.  A complete medication list was provided to the patient / caregiver.

## 2010-12-06 LAB — GLUCOSE, CAPILLARY
Glucose-Capillary: 134 mg/dL — ABNORMAL HIGH (ref 70–99)
Glucose-Capillary: 138 mg/dL — ABNORMAL HIGH (ref 70–99)
Glucose-Capillary: 146 mg/dL — ABNORMAL HIGH (ref 70–99)
Glucose-Capillary: 149 mg/dL — ABNORMAL HIGH (ref 70–99)
Glucose-Capillary: 151 mg/dL — ABNORMAL HIGH (ref 70–99)
Glucose-Capillary: 180 mg/dL — ABNORMAL HIGH (ref 70–99)
Glucose-Capillary: 83 mg/dL (ref 70–99)
Glucose-Capillary: 87 mg/dL (ref 70–99)

## 2010-12-06 LAB — URINALYSIS, ROUTINE W REFLEX MICROSCOPIC
Glucose, UA: NEGATIVE mg/dL
Hgb urine dipstick: NEGATIVE
Ketones, ur: 15 mg/dL — AB
pH: 5.5 (ref 5.0–8.0)

## 2010-12-06 LAB — URINALYSIS, MICROSCOPIC ONLY
Leukocytes, UA: NEGATIVE
Nitrite: NEGATIVE
Protein, ur: NEGATIVE mg/dL
Specific Gravity, Urine: 1.013 (ref 1.005–1.030)
Urobilinogen, UA: 1 mg/dL (ref 0.0–1.0)

## 2010-12-06 LAB — BLOOD GAS, ARTERIAL
Acid-Base Excess: 13.5 mmol/L — ABNORMAL HIGH (ref 0.0–2.0)
Acid-Base Excess: 14.3 mmol/L — ABNORMAL HIGH (ref 0.0–2.0)
Bicarbonate: 43.4 mEq/L — ABNORMAL HIGH (ref 20.0–24.0)
Delivery systems: POSITIVE
Delivery systems: POSITIVE
FIO2: 0.75 %
O2 Saturation: 84.9 %
Patient temperature: 97.5
Patient temperature: 98.6
TCO2: 44.3 mmol/L (ref 0–100)
TCO2: 44.5 mmol/L (ref 0–100)
pCO2 arterial: 95 mmHg (ref 35.0–45.0)
pH, Arterial: 7.235 — ABNORMAL LOW (ref 7.350–7.400)
pH, Arterial: 7.377 (ref 7.350–7.400)

## 2010-12-06 LAB — COMPREHENSIVE METABOLIC PANEL
AST: 17 U/L (ref 0–37)
BUN: 26 mg/dL — ABNORMAL HIGH (ref 6–23)
CO2: 44 mEq/L (ref 19–32)
Chloride: 89 mEq/L — ABNORMAL LOW (ref 96–112)
Creatinine, Ser: 1.09 mg/dL (ref 0.4–1.2)
GFR calc non Af Amer: 51 mL/min — ABNORMAL LOW (ref >60)
Glucose, Bld: 149 mg/dL — ABNORMAL HIGH (ref 70–99)
Total Bilirubin: 0.5 mg/dL (ref 0.3–1.2)

## 2010-12-06 LAB — URINE CULTURE
Colony Count: NO GROWTH
Culture: NO GROWTH

## 2010-12-06 LAB — CBC
HCT: 38.5 % (ref 36.0–46.0)
HCT: 40.1 % (ref 36.0–46.0)
Hemoglobin: 12.8 g/dL (ref 12.0–15.0)
MCHC: 33.8 g/dL (ref 30.0–36.0)
MCV: 90.7 fL (ref 78.0–100.0)
MCV: 91 fL (ref 78.0–100.0)
Platelets: 229 10*3/uL (ref 150–400)
Platelets: 229 10*3/uL (ref 150–400)
RBC: 4.25 MIL/uL (ref 3.87–5.11)
WBC: 8 10*3/uL (ref 4.0–10.5)
WBC: 8.1 10*3/uL (ref 4.0–10.5)
WBC: 8.2 10*3/uL (ref 4.0–10.5)

## 2010-12-06 LAB — CARDIAC PANEL(CRET KIN+CKTOT+MB+TROPI)
CK, MB: 5 ng/mL — ABNORMAL HIGH (ref 0.3–4.0)
CK, MB: 6.3 ng/mL (ref 0.3–4.0)
CK, MB: 7.5 ng/mL (ref 0.3–4.0)
Relative Index: 2.9 — ABNORMAL HIGH (ref 0.0–2.5)
Relative Index: 3.3 — ABNORMAL HIGH (ref 0.0–2.5)
Total CK: 193 U/L — ABNORMAL HIGH (ref 7–177)
Troponin I: 0.04 ng/mL (ref 0.00–0.06)
Troponin I: 0.04 ng/mL (ref 0.00–0.06)

## 2010-12-06 LAB — POCT I-STAT 3, ART BLOOD GAS (G3+)
Acid-Base Excess: 13 mmol/L — ABNORMAL HIGH (ref 0.0–2.0)
O2 Saturation: 92 %
Patient temperature: 97.3
TCO2: 47 mmol/L (ref 0–100)

## 2010-12-06 LAB — BASIC METABOLIC PANEL
BUN: 23 mg/dL (ref 6–23)
BUN: 30 mg/dL — ABNORMAL HIGH (ref 6–23)
BUN: 35 mg/dL — ABNORMAL HIGH (ref 6–23)
CO2: 39 mEq/L — ABNORMAL HIGH (ref 19–32)
CO2: 40 mEq/L — ABNORMAL HIGH (ref 19–32)
Calcium: 8.7 mg/dL (ref 8.4–10.5)
Calcium: 9.2 mg/dL (ref 8.4–10.5)
Calcium: 9.4 mg/dL (ref 8.4–10.5)
Chloride: 88 mEq/L — ABNORMAL LOW (ref 96–112)
Chloride: 93 mEq/L — ABNORMAL LOW (ref 96–112)
Creatinine, Ser: 0.9 mg/dL (ref 0.4–1.2)
Creatinine, Ser: 0.99 mg/dL (ref 0.4–1.2)
Creatinine, Ser: 1.63 mg/dL — ABNORMAL HIGH (ref 0.4–1.2)
GFR calc Af Amer: 39 mL/min — ABNORMAL LOW (ref >60)
GFR calc Af Amer: >60 mL/min (ref >60)
GFR calc non Af Amer: 57 mL/min — ABNORMAL LOW (ref >60)
GFR calc non Af Amer: >60 mL/min (ref >60)
Glucose, Bld: 102 mg/dL — ABNORMAL HIGH (ref 70–99)
Glucose, Bld: 186 mg/dL — ABNORMAL HIGH (ref 70–99)
Potassium: 3.7 mEq/L (ref 3.5–5.1)
Potassium: 4.1 mEq/L (ref 3.5–5.1)
Sodium: 139 mEq/L (ref 135–145)

## 2010-12-06 LAB — TSH: TSH: 1.308 u[IU]/mL (ref 0.350–4.500)

## 2010-12-06 LAB — MAGNESIUM
Magnesium: 2 mg/dL (ref 1.5–2.5)
Magnesium: 2.1 mg/dL (ref 1.5–2.5)

## 2010-12-06 LAB — DIFFERENTIAL
Basophils Relative: 0 % (ref 0–1)
Eosinophils Absolute: 0 10*3/uL (ref 0.0–0.7)
Neutro Abs: 6.4 10*3/uL (ref 1.7–7.7)
Neutrophils Relative %: 78 % — ABNORMAL HIGH (ref 43–77)

## 2010-12-06 LAB — POCT CARDIAC MARKERS
CKMB, poc: 5 ng/mL (ref 1.0–8.0)
Myoglobin, poc: 488 ng/mL (ref 12–200)

## 2010-12-06 LAB — TROPONIN I: Troponin I: 0.05 ng/mL (ref 0.00–0.06)

## 2010-12-06 LAB — PHOSPHORUS
Phosphorus: 3.6 mg/dL (ref 2.3–4.6)
Phosphorus: 5.4 mg/dL — ABNORMAL HIGH (ref 2.3–4.6)

## 2010-12-06 LAB — CK TOTAL AND CKMB (NOT AT ARMC): Relative Index: 2.6 — ABNORMAL HIGH (ref 0.0–2.5)

## 2010-12-06 LAB — HEMOGLOBIN A1C
Hgb A1c MFr Bld: 6 % (ref 4.6–6.1)
Mean Plasma Glucose: 126 mg/dL

## 2010-12-06 LAB — LIPID PANEL: HDL: 65 mg/dL (ref >39)

## 2010-12-06 LAB — BRAIN NATRIURETIC PEPTIDE: Pro B Natriuretic peptide (BNP): 224 pg/mL — ABNORMAL HIGH (ref 0.0–100.0)

## 2010-12-06 LAB — SODIUM, URINE, RANDOM: Sodium, Ur: 17 mEq/L

## 2011-01-15 HISTORY — PX: TRANSTHORACIC ECHOCARDIOGRAM: SHX275

## 2011-01-15 HISTORY — PX: NM MYOVIEW LTD: HXRAD82

## 2011-02-01 NOTE — Consult Note (Signed)
Jillian [name], [name]              ACCOUNT NO.:  [id number]   MEDICAL RECORD NO.:  [id number]          PATIENT TYPE:  OUT   LOCATION:  GYN                          FACILITY:  [facility]   PHYSICIAN:  De Blanch, M.D.DATE OF BIRTH:  [birth year]   DATE OF CONSULTATION:  09/25/2004  DATE OF DISCHARGE:                                   CONSULTATION   HISTORY OF PRESENT ILLNESS:  This is a 65 year old white female seen in  consultation requested by Dr. Teodora Medici regarding findings of an ovarian  cyst.  Apparently the patient had some lower abdominal pain this summer and  underwent a CT scan which showed a 3.7-cm ovarian cyst.  Ultrasound showed  this cyst to be complex with two adjacent cysts but it was not well  visualized.  At that time, the CA125 value was 8.3.  Subsequently, the  patient had a repeat ultrasound in July which showed a tubular structure  measuring 6.2 x 2.8 x 3.4 cm with some septations.  An MRI was then done in  August which showed a bilobed cystic structure measuring 5.3 x 2.6 x 3.1 cm  arising in the right ovary.  Followup MRI in December was essentially  unchanged, especially with regard to the cyst.  The patient notes that her  pain is predominantly associated with physical activity.  She has some  chronic constipation as well.  She has no other gynecologic symptoms.   PAST MEDICAL HISTORY:  Hypertension, depression, hypothyroidism,  degenerative disk disease, obesity.   PAST SURGICAL HISTORY:  Carpal tunnel syndrome repair, tonsillectomy.   CURRENT MEDICATIONS:  Atenolol, Norvasc, levothyroxine, Cymbalta,  hydroxyzine, Lipitor, Lasix, Midrin, Cozaar, multivitamins, calcium, vitamin  D, oxycodone, Naproxen, Nexium.   ALLERGIES:  No drug allergies.   PAST OBSTETRICAL HISTORY:  Gravida 3.   FAMILY HISTORY:  Negative for gynecologic, breast, or colon cancer.   SOCIAL HISTORY:  The patient is married.  She is disabled due to back pain.  She smokes one  pack per day.  She does not drink alcohol.   REVIEW OF SYSTEMS:  Essentially negative except as noted above.   PHYSICAL EXAMINATION:  Height 5 feet 5 inches, weight 302 pounds.  General:  The patient is an obese white female in no acute distress.  HEENT:  Negative.  Neck:  Supple without thyromegaly.  There is no supraclavicular  or inguinal adenopathy.  Abdomen:  Obese, soft, nontender.  No masses or  organomegaly.  The site of the hernia is noted.  Pelvic:  EG/BUS vagina and  urethra are normal. Cervix is normal.  Uterus is anterior, normal shape,  size, and consistency.  There are no adnexal masses palpable.   IMPRESSION:  Bilobed cystic mass in the right adnexa, which I do not believe  is causing any symptoms given the fact the patient's primary symptoms are in  her left lower quadrant.  The cyst seems to be unchanged over the past 6  months.  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  The patient understands these recommendations and will  return to the care of Dr. Chevis Pretty for followup.      DC/MEDQ  D:  09/25/2004  T:  09/25/2004  Job:  [id number]   cc:   Leatha Gilding. Mezer, M.D.  [address]  [place]  [place] [address]  Fax: [phone]   Telford Nab, R.N.  [phone] [address]  [place], [place] [address]

## 2011-02-01 NOTE — Op Note (Signed)
NAMESHENANDOAH, YEATS              ACCOUNT NO.:  [id number]   MEDICAL RECORD NO.:  [id number]          PATIENT TYPE:  AMB   LOCATION:  [facility]                         [facility]ACILITY:  [facility]   PHYSICIAN:  Sigmund I. Patsi Sears, M.D.DATE OF BIRTH:  1946-08-30   DATE OF PROCEDURE:  06/07/2005  DATE OF DISCHARGE:                                 OPERATIVE REPORT   PREOPERATIVE DIAGNOSIS:  Stress urinary incontinence.   POSTOPERATIVE DIAGNOSIS:  Stress urinary incontinence.   PROCEDURE:  1.  Cystourethroscopy.  2.  Transobturator sling.   SURGEON:  Dr. Jethro Bolus   ASSISTANT:  Dr. Glade Nurse   ANESTHESIA:  General endotracheal.   SPECIMENS:  None.   PROCEDURE:  The patient was identified by her wrist bracelet and brought to  room 4.  She received preoperative antibiotics and was administered general  anesthesia.  She was prepped and draped in the usual sterile fashion, taking  care to avoid compartment syndrome or peripheral neuropathy.  Next, we  inserted a Foley catheter at the level of the bladder.  Clear urine output  was obtained, and a weighted speculum was placed in her anterior vagina.  We  then placed a curved cerebellar for further exposure.  Next, we infiltrated  1% Marcaine with epinephrine over the anterior vaginal wall at the 12  o'clock position, just proximal to the urethra.  We made an incision  parallel with axis of the vagina from approximately 1 cm proximal to the  urethral meatus over 2 cm in length.  We created generous vaginal flaps  bilaterally sharply, dissecting down but not through the pubocervical  fascia.  Next, we marked skin incisions at the level of the clitoris just  lateral to the pubic rami on each side.  This was carried down sharply to  the dermis.  Next, we passed our trocar, starting with the right trocar  behind the pubis onto the surgeon's first finger, and the fornix was created  surgically and brought the trocar out lateral to the  urethra at the mid  urethral level.  We ensured there was no buttonholing of the vaginal fornix.  This was done in bilateral fashion.  Next, the sling was attached to the  trocars and with the proper orientation we removed the patient's Foley.  She  underwent pancystourethroscopy with the 70 and 12-degree lenses using 400 mL  of normal saline.  There were no mucosal abnormalities or bleeding.  There  were no foreign bodies seen throughout the mucosa, including the urethra.  Next, the cystoscope was removed.  Foley catheter was placed; bladder was  drained.  Trocars were pulled out at the level of the skin.  The sling was  tensioned appropriately leaving a right-angle clamp between the urethra and  the sling.  The tab was cut, and the plastic coating was pulled off the  sling, and the sling was retensioned, leaving right-angle clamp between the  urethra and the sling which was then removed.  The skin was then closed  after trimming the sling to the level of the skin with heavy scissors.  These incisions were  then closed with Dermabond.  The vaginal incision was  closed with  a running 2-0 Vicryl.  Vaginal packing was applied; retractors were removed.  The patient was reversed from her anesthesia which she tolerated without  complication.  Please note Dr. Jethro Bolus was present and  participated in all aspects of this case.     ______________________________  Glade Nurse, MD      Sigmund I. Patsi Sears, M.D.  Electronically Signed    MT/MEDQ  D:  06/07/2005  T:  06/07/2005  Job:  [id number]

## 2011-04-17 ENCOUNTER — Other Ambulatory Visit: Payer: Self-pay | Admitting: Internal Medicine

## 2011-04-17 DIAGNOSIS — Z1231 Encounter for screening mammogram for malignant neoplasm of breast: Secondary | ICD-10-CM

## 2011-04-19 ENCOUNTER — Ambulatory Visit: Payer: 59

## 2011-04-26 ENCOUNTER — Ambulatory Visit: Payer: 59

## 2011-05-03 ENCOUNTER — Ambulatory Visit: Payer: 59

## 2011-05-10 ENCOUNTER — Ambulatory Visit
Admission: RE | Admit: 2011-05-10 | Discharge: 2011-05-10 | Disposition: A | Discharge status: Home / Self Care | Payer: 59 | Source: Ambulatory Visit | Attending: Internal Medicine | Admitting: Internal Medicine

## 2011-05-10 DIAGNOSIS — Z1231 Encounter for screening mammogram for malignant neoplasm of breast: Secondary | ICD-10-CM

## 2011-06-11 LAB — CREATININE, SERUM
Creatinine, Ser: 0.61
GFR calc Af Amer: >60

## 2012-05-19 ENCOUNTER — Other Ambulatory Visit: Payer: Self-pay | Admitting: Internal Medicine

## 2012-05-19 ENCOUNTER — Other Ambulatory Visit (HOSPITAL_COMMUNITY): Payer: Self-pay | Admitting: Internal Medicine

## 2012-05-19 DIAGNOSIS — Z1231 Encounter for screening mammogram for malignant neoplasm of breast: Secondary | ICD-10-CM

## 2012-05-27 ENCOUNTER — Ambulatory Visit: Payer: 59

## 2012-06-03 ENCOUNTER — Ambulatory Visit: Payer: 59

## 2012-06-16 LAB — HM MAMMOGRAPHY

## 2012-06-17 ENCOUNTER — Ambulatory Visit: Payer: 59

## 2012-06-19 ENCOUNTER — Ambulatory Visit: Payer: 59

## 2012-06-23 ENCOUNTER — Ambulatory Visit: Payer: 59

## 2012-07-08 ENCOUNTER — Ambulatory Visit
Admission: RE | Admit: 2012-07-08 | Discharge: 2012-07-08 | Disposition: A | Discharge status: Home / Self Care | Payer: Medicare Other | Source: Ambulatory Visit | Attending: Internal Medicine | Admitting: Internal Medicine

## 2012-07-08 DIAGNOSIS — Z1231 Encounter for screening mammogram for malignant neoplasm of breast: Secondary | ICD-10-CM

## 2013-06-14 ENCOUNTER — Other Ambulatory Visit: Payer: Self-pay

## 2013-06-14 DIAGNOSIS — Z1231 Encounter for screening mammogram for malignant neoplasm of breast: Secondary | ICD-10-CM

## 2013-07-09 ENCOUNTER — Ambulatory Visit: Payer: Medicare Other

## 2013-08-05 ENCOUNTER — Ambulatory Visit: Payer: Medicare Other

## 2013-08-16 ENCOUNTER — Encounter: Payer: Self-pay | Admitting: Physician Assistant

## 2013-08-19 ENCOUNTER — Ambulatory Visit: Payer: Self-pay | Admitting: Physician Assistant

## 2013-08-30 ENCOUNTER — Other Ambulatory Visit: Payer: Self-pay | Admitting: Internal Medicine

## 2013-08-30 MED ORDER — LOSARTAN POTASSIUM 100 MG PO TABS: 100.0000 mg | ORAL_TABLET | Freq: Every day | ORAL | Status: DC

## 2013-09-07 ENCOUNTER — Ambulatory Visit: Payer: Medicare Other

## 2013-09-14 ENCOUNTER — Encounter: Payer: Self-pay | Admitting: Physician Assistant

## 2013-09-14 ENCOUNTER — Ambulatory Visit (INDEPENDENT_AMBULATORY_CARE_PROVIDER_SITE_OTHER): Payer: Medicare Other | Admitting: Physician Assistant

## 2013-09-14 VITALS — BP 136/68 | HR 76 | Temp 97.3°F | Resp 16 | Ht 64.0 in

## 2013-09-14 DIAGNOSIS — E669 Obesity, unspecified: Secondary | ICD-10-CM

## 2013-09-14 DIAGNOSIS — E559 Vitamin D deficiency, unspecified: Secondary | ICD-10-CM | POA: Insufficient documentation

## 2013-09-14 DIAGNOSIS — E119 Type 2 diabetes mellitus without complications: Secondary | ICD-10-CM

## 2013-09-14 DIAGNOSIS — I10 Essential (primary) hypertension: Secondary | ICD-10-CM | POA: Insufficient documentation

## 2013-09-14 DIAGNOSIS — E785 Hyperlipidemia, unspecified: Secondary | ICD-10-CM | POA: Insufficient documentation

## 2013-09-14 DIAGNOSIS — Z79899 Other long term (current) drug therapy: Secondary | ICD-10-CM

## 2013-09-14 LAB — CBC WITH DIFFERENTIAL/PLATELET
Basophils Absolute: 0 10*3/uL (ref 0.0–0.1)
Basophils Relative: 0 % (ref 0–1)
Eosinophils Absolute: 0.1 10*3/uL (ref 0.0–0.7)
Hemoglobin: 11.8 g/dL — ABNORMAL LOW (ref 12.0–15.0)
Lymphocytes Relative: 33 % (ref 12–46)
MCH: 26.2 pg (ref 26.0–34.0)
MCHC: 31.2 g/dL (ref 30.0–36.0)
Monocytes Relative: 6 % (ref 3–12)
Neutro Abs: 3.3 10*3/uL (ref 1.7–7.7)
Neutrophils Relative %: 60 % (ref 43–77)
Platelets: 210 10*3/uL (ref 150–400)
RDW: 14.6 % (ref 11.5–15.5)
WBC: 5.6 10*3/uL (ref 4.0–10.5)

## 2013-09-14 MED ORDER — AZITHROMYCIN 250 MG PO TABS: ORAL_TABLET | ORAL | Status: AC

## 2013-09-14 MED ORDER — BENZONATATE 100 MG PO CAPS: 100.0000 mg | ORAL_CAPSULE | Freq: Four times a day (QID) | ORAL | Status: DC | PRN

## 2013-09-14 MED ORDER — PREDNISONE 20 MG PO TABS: ORAL_TABLET | ORAL | Status: DC

## 2013-09-14 NOTE — Progress Notes (Signed)
HPI Patient presents for 3 month follow up with hypertension, hyperlipidemia, diabetes and vitamin D. Patient's blood pressure has been controlled at home, today their BP is BP: 136/68 mmHg Patient denies chest pain,  dizziness but has had SOB. Last week she states she had sinus congestion, fever, chills, and had to turn up her oxygen last week to a 9 and currently is on a 5. Her oxygen sat has been around 92.  She is in a wheelchair, morbidly obese and oxygen dependent.  Patient's cholesterol is diet controlled. In addition they are on Pravastatin  and denies myalgias. The cholesterol last visit was The patient has not been working on diet and exercise for Diabetes, and denies changes in vision, polys, and paresthesias. She ran out of strips and has not been checking her sugar at home.  Patient is on Vitamin D supplement.   Current Medications:  Current Outpatient Prescriptions on File Prior to Visit  Medication Sig Dispense Refill  . acetaZOLAMIDE (DIAMOX) 250 MG tablet Take 250 mg by mouth 2 (two) times daily.      Marland Kitchen ALPRAZolam (XANAX) 0.5 MG tablet Take 0.5 mg by mouth at bedtime as needed for anxiety.      Marland Kitchen aspirin 81 MG tablet Take 81 mg by mouth daily.      . DULoxetine (CYMBALTA) 60 MG capsule Take 60 mg by mouth daily.      . famotidine (PEPCID) 20 MG tablet Take 20 mg by mouth 2 (two) times daily.      . furosemide (LASIX) 40 MG tablet Take 40 mg by mouth.      . levothyroxine (SYNTHROID, LEVOTHROID) 200 MCG tablet Take 200 mcg by mouth daily before breakfast.      . losartan (COZAAR) 100 MG tablet Take 1 tablet (100 mg total) by mouth daily.  90 tablet  0  . metFORMIN (GLUCOPHAGE-XR) 500 MG 24 hr tablet Take 500 mg by mouth 4 (four) times daily - after meals and at bedtime.      . OXYGEN-HELIUM IN Inhale 4 L into the lungs.      . pravastatin (PRAVACHOL) 40 MG tablet Take 40 mg by mouth daily.       No current facility-administered medications on file prior to visit.   Medical  History:  Past Medical History  Diagnosis Date  . Hypertension   . Hyperlipidemia   . Diabetes mellitus without complication   . Obesity   . Vitamin D deficiency   . COPD (chronic obstructive pulmonary disease)   . Arthritis   . GERD (gastroesophageal reflux disease)   . Depression   . Thyroid disease   . OAB (overactive bladder)    Allergies:  Allergies  Allergen Reactions  . Ace Inhibitors     cough  . Inderal [Propranolol]     Hair loss    ROS Constitutional: Denies fever, chills, headaches, insomnia, fatigue, night sweats Eyes: Denies redness, blurred vision, diplopia, discharge, itchy, watery eyes.  ENT: Denies congestion, post nasal drip, sore throat, earache, dental pain, Tinnitus, Vertigo, Sinus pain, snoring.  Cardio: Denies chest pain, palpitations, irregular heartbeat, dyspnea, diaphoresis, orthopnea, PND, claudication, edema Respiratory: + cough, shortness of breath  Gastrointestinal: Denies dysphagia, heartburn, AB pain/ cramps, N/V, diarrhea, constipation, hematemesis, melena, hematochezia,  hemorrhoids Genitourinary: Denies dysuria, frequency, urgency, nocturia, hesitancy, discharge, hematuria, flank pain Musculoskeletal: Denies myalgia, stiffness, pain, swelling and strain/sprain. Skin: Denies pruritis, rash, changing in skin lesion Neuro: Denies Weakness, tremor, incoordination, spasms, pain Psychiatric: Denies confusion, memory  loss, sensory loss Endocrine: Denies change in weight, skin, hair change, nocturia Diabetic Polys, Denies visual blurring, hyper /hypo glycemic episodes, and paresthesia, Heme/Lymph: Denies Excessive bleeding, bruising, enlarged lymph nodes  Family history- Review and unchanged Social history- Review and unchanged Physical Exam: Filed Vitals:   09/14/13 1538  BP: 136/68  Pulse: 76  Temp: 97.3 F (36.3 C)  Resp: 16   There were no vitals filed for this visit. General Appearance: Morbidly obese, in no apparent distress. Eyes:  PERRLA, EOMs, conjunctiva no swelling or erythema Sinuses: No Frontal/maxillary tenderness ENT/Mouth: Ext aud canals clear, TMs without erythema, bulging. No erythema, swelling, or exudate on post pharynx.  Tonsils not swollen or erythematous. Hearing normal.  Neck: Supple, thyroid normal.  Respiratory: Respiratory effort difficult, BS equally decreased bilaterally with wheezing diffuse without rales, rhonchi,  or stridor.  Cardio: Decreased heart sounds secondary body habitus RRR with no mild systolic murmur without RGs.  Abdomen: Soft, obese, + BS.  Non tender, no guarding, rebound, hernias, masses. Lymphatics: Non tender without lymphadenopathy.  Musculoskeletal: Full ROM, wheelchair bound Skin: Warm, dry without rashes, lesions, ecchymosis.  Neuro: Cranial nerves intact. No cerebellar symptoms.  Psych: Awake and oriented X 3, normal affect.  Assessment and Plan:  Hypertension: Continue medication, monitor blood pressure at home.  Continue DASH diet. Cholesterol: Continue diet and exercise. Check cholesterol.  Diabetes-Continue diet and exercise. Check A1C- refill strips Vitamin D Def- check level and continue medications.  Bronchitis/COPD exacerbation- Zpak, prednisone 20, tessalon pearles.   Continue diet and meds as discussed. Further disposition pending results of labs. Discussed med's effects and SE's.    Jillian Wood 3:52 PM

## 2013-09-14 NOTE — Patient Instructions (Addendum)
 Bad carbs also include fruit juice, alcohol, and sweet tea. These are empty calories that do not signal to your brain that you are full.   Please remember the good carbs are still carbs which convert into sugar. So please measure them out no more than 1/2-1 cup of rice, oatmeal, pasta, and beans.  Veggies are however free foods! Pile them on.   I like lean protein at every meal such as chicken, turkey, pork chops, cottage cheese, etc. Just do not fry these meats and please center your meal around vegetable, the meats should be a side dish.   No all fruit is created equal. Please see the list below, the fruit at the bottom is higher in sugars than the fruit at the top   The majority of colds are caused by viruses and do not require antibiotics. Please read the rest of this hand out to learn more about the common cold and what you can do to help yourself as well as help prevent the over use of antibiotics.   COMMON COLD SIGNS AND SYMPTOMS - The common cold usually causes nasal congestion, runny nose, and sneezing. A sore throat may be present on the first day but usually resolves quickly. If a cough occurs, it generally develops on about the fourth or fifth day of symptoms, typically when congestion and runny nose are resolving  COMMON COLD COMPLICATIONS - In most cases, colds do not cause serious illness or complications. Most colds last for three to seven days, although many people continue to have symptoms (coughing, sneezing, congestion) for up to two weeks.  One of the more common complications is sinusitis, which is usually caused by viruses and rarely (about 2 percent of the time) by bacteria. Having thick or yellow to green-colored nasal discharge does not mean that bacterial sinusitis has developed; discolored nasal discharge is a normal phase of the common cold.  Lower respiratory infections, such as pneumonia or bronchitis, may develop following a cold.  Infection of the middle ear, or  otitis media, can accompany or follow a cold.  COMMON COLD TREATMENT - There is no specific treatment for the viruses that cause the common cold. Most treatments are aimed at relieving some of the symptoms of the cold, but do not shorten or cure the cold. Antibiotics are not useful for treating the common cold; antibiotics are only used to treat illnesses caused by bacteria, not viruses. Unnecessary use of antibiotics for the treatment of the common cold can cause allergic reactions, diarrhea, or other gastrointestinal symptoms in some patients.  The symptoms of a cold will resolve over time, even without any treatment. People with underlying medical conditions and those who use other over-the-counter or prescription medications should speak with their healthcare provider or pharmacist to ensure that it is safe to use these treatments. The following are treatments that may reduce the symptoms caused by the common cold.  Nasal congestion - Decongestants are good for nasal congestion- if you feel very stuffy but no mucus is coming out, this is the medication that will help you the most.  Pseudoephedrine is a decongestant that can improve nasal congestion. Although a prescription is not required, drugstores in the [place] keep pseudoephedrine behind the counter, so it must be requested from a pharmacist. If you have a heart condition or high blood pressure please use Coricidin BPH instead.   Runny nose - Antihistamines such as diphenhydramine (Benadryl), certazine (Zyrtec) which are best taking at night because they   can make you tired OR loratadine (Claritin),  fexafinadine (Allegra) help with a runny nose.   Nasal sprays such an oxymetazoline (Afrin and others) may also give temporary relief of nasal congestion. However, these sprays should never be used for more than two to three days; use for more than three days use can worsen congestion.  Nasocort is now over the counter and can help decrease a  runny nose. Please stop the medication if you have blurry vision or nose bleeds.   Sore throat and headache - Sore throat and headache are best treated with a mild pain reliever such as acetaminophen (Tylenol) or a non-steroidal anti-inflammatory agent such as ibuprofen or naproxen (Motrin or Aleve). These medications should be taken with food to prevent stomach problems. As well as gargling with warm water and salt.   Cough - Common cough medicine ingredients include guaifenesin and dextromethorphan; these are often combined with other medications in over-the-counter cold formulas. Often a cough is worse at night or first in the morning due to post nasal drip from you nose. You can try to sleep at an angle to decrease a cough.   Alternative treatments - Heated, humidified air can improve symptoms of nasal congestion and runny nose, and causes few to no side effects. A number of alternative products, including vitamin C, doubling up on your vitamin D and herbal products such as echinacea, may help. Certain products, such as nasal gels that contain zinc (eg, Zicam), have been associated with a permanent loss of smell.  Antibiotics - Antibiotics should not be used to treat an uncomplicated common cold. As noted above, colds are caused by viruses. Antibiotics treat bacterial, not viral infections. Some viruses that cause the common cold can also depress the immune system or cause swelling in the lining of the nose or airways; this can, in turn, lead to a bacterial infection. Often you need to give your body 7 days to fight off a common cold while treating the symptoms with the medications listed above. If after 7 days your symptoms are not improving, you are getting worse, you have shortness of breath, chest pain, a fever of over 103 you should seek medical help immediately.   PREVENTION IS THE BEST MEDICINE - Hand washing is an essential and highly effective way to prevent the spread of infection.    Alcohol-based hand rubs are a good alternative for disinfecting hands if a sink is not available.  Hands should be washed before preparing food and eating and after coughing, blowing the nose, or sneezing. While it is not always possible to limit contact with people who may be infected with a cold, touching the eyes, nose, or mouth after direct contact should be avoided when possible. Sneezing/coughing into the sleeve of one's clothing (at the inner elbow) is another means of containing sprays of saliva and secretions and does not contaminate the hands.     

## 2013-09-15 ENCOUNTER — Ambulatory Visit: Payer: Medicare Other

## 2013-09-15 LAB — BASIC METABOLIC PANEL WITH GFR
Calcium: 8.8 mg/dL (ref 8.4–10.5)
GFR, Est African American: >89 mL/min
GFR, Est Non African American: >89 mL/min
Glucose, Bld: 111 mg/dL — ABNORMAL HIGH (ref 70–99)
Potassium: 3.8 mEq/L (ref 3.5–5.3)
Sodium: 143 mEq/L (ref 135–145)

## 2013-09-15 LAB — LIPID PANEL
Cholesterol: 164 mg/dL (ref 0–200)
HDL: 48 mg/dL (ref >39)
LDL Cholesterol: 89 mg/dL (ref 0–99)
Triglycerides: 133 mg/dL (ref <150)
VLDL: 27 mg/dL (ref 0–40)

## 2013-09-15 LAB — HEPATIC FUNCTION PANEL
ALT: 8 U/L (ref 0–35)
Albumin: 3.2 g/dL — ABNORMAL LOW (ref 3.5–5.2)
Bilirubin, Direct: 0.1 mg/dL (ref 0.0–0.3)
Indirect Bilirubin: 0.2 mg/dL (ref 0.0–0.9)
Total Bilirubin: 0.3 mg/dL (ref 0.3–1.2)

## 2013-09-15 LAB — HEMOGLOBIN A1C: Mean Plasma Glucose: 134 mg/dL — ABNORMAL HIGH (ref <117)

## 2013-09-15 LAB — TSH: TSH: 11.73 u[IU]/mL — ABNORMAL HIGH (ref 0.350–4.500)

## 2013-09-28 ENCOUNTER — Other Ambulatory Visit: Payer: Self-pay | Admitting: Physician Assistant

## 2013-09-28 DIAGNOSIS — E872 Acidosis: Secondary | ICD-10-CM

## 2013-09-28 DIAGNOSIS — E8729 Other acidosis: Secondary | ICD-10-CM

## 2013-09-28 DIAGNOSIS — I10 Essential (primary) hypertension: Secondary | ICD-10-CM

## 2013-09-28 DIAGNOSIS — E039 Hypothyroidism, unspecified: Secondary | ICD-10-CM

## 2013-09-29 ENCOUNTER — Other Ambulatory Visit: Payer: Self-pay

## 2013-10-04 ENCOUNTER — Other Ambulatory Visit: Payer: Self-pay

## 2013-10-06 ENCOUNTER — Other Ambulatory Visit: Payer: Medicare Other

## 2013-10-06 ENCOUNTER — Ambulatory Visit
Admission: RE | Admit: 2013-10-06 | Discharge: 2013-10-06 | Disposition: A | Discharge status: Home / Self Care | Payer: Medicare Other | Source: Ambulatory Visit

## 2013-10-06 DIAGNOSIS — E8729 Other acidosis: Secondary | ICD-10-CM

## 2013-10-06 DIAGNOSIS — Z1231 Encounter for screening mammogram for malignant neoplasm of breast: Secondary | ICD-10-CM

## 2013-10-06 DIAGNOSIS — I10 Essential (primary) hypertension: Secondary | ICD-10-CM

## 2013-10-06 DIAGNOSIS — E039 Hypothyroidism, unspecified: Secondary | ICD-10-CM

## 2013-10-06 DIAGNOSIS — E872 Acidosis: Secondary | ICD-10-CM

## 2013-10-06 LAB — CBC WITH DIFFERENTIAL/PLATELET
Basophils Absolute: 0 10*3/uL (ref 0.0–0.1)
Basophils Relative: 0 % (ref 0–1)
Eosinophils Absolute: 0.1 10*3/uL (ref 0.0–0.7)
Eosinophils Relative: 1 % (ref 0–5)
HCT: 41.6 % (ref 36.0–46.0)
HEMOGLOBIN: 13 g/dL (ref 12.0–15.0)
LYMPHS ABS: 1.4 10*3/uL (ref 0.7–4.0)
Lymphocytes Relative: 22 % (ref 12–46)
MCH: 26.7 pg (ref 26.0–34.0)
MCHC: 31.3 g/dL (ref 30.0–36.0)
MCV: 85.4 fL (ref 78.0–100.0)
Monocytes Absolute: 0.4 10*3/uL (ref 0.1–1.0)
Monocytes Relative: 6 % (ref 3–12)
NEUTROS PCT: 71 % (ref 43–77)
Neutro Abs: 4.5 10*3/uL (ref 1.7–7.7)
Platelets: 159 10*3/uL (ref 150–400)
RBC: 4.87 MIL/uL (ref 3.87–5.11)
RDW: 15 % (ref 11.5–15.5)
WBC: 6.4 10*3/uL (ref 4.0–10.5)

## 2013-10-07 LAB — BASIC METABOLIC PANEL WITH GFR
BUN: 12 mg/dL (ref 6–23)
CO2: 39 mEq/L — ABNORMAL HIGH (ref 19–32)
Calcium: 9.2 mg/dL (ref 8.4–10.5)
Chloride: 99 mEq/L (ref 96–112)
Creat: 0.57 mg/dL (ref 0.50–1.10)
GFR, Est African American: >89 mL/min
GFR, Est Non African American: >89 mL/min
Glucose, Bld: 128 mg/dL — ABNORMAL HIGH (ref 70–99)
Potassium: 4.3 mEq/L (ref 3.5–5.3)
SODIUM: 142 meq/L (ref 135–145)

## 2013-10-07 LAB — TSH: TSH: 0.107 u[IU]/mL — ABNORMAL LOW (ref 0.350–4.500)

## 2013-11-06 DIAGNOSIS — Z79899 Other long term (current) drug therapy: Secondary | ICD-10-CM | POA: Insufficient documentation

## 2013-11-06 NOTE — Progress Notes (Deleted)
   Subjective:    Patient ID: Jillian Wood, female    DOB: June 28, 1946, 68 y.o.   MRN: [id number]  HPI  68 yo MWF with HTN, end stage COPD on O2 who continues to smoke who returns for F/U labs to monitor CO2 since initiating Diamox with initial good response. She aso had thyroid dose adjusted at last OV and returns for recheck of that also.   Review of Systems     Objective:   Physical ExamSeverely Over nourished and endomorphic in wheelchair        Assessment & Plan:  1. COPD   2. Unspecified hypothyroidism   3. CO2 retention   4. Encounter for long-term (current) use of other medications

## 2013-11-08 ENCOUNTER — Encounter: Payer: Self-pay | Admitting: Internal Medicine

## 2013-11-09 NOTE — Progress Notes (Signed)
Patient ID: Jillian Wood, female   DOB: 1946-06-22, 68 y.o.   MRN: [id number]

## 2013-11-15 ENCOUNTER — Encounter: Payer: Self-pay | Admitting: Internal Medicine

## 2013-11-15 ENCOUNTER — Ambulatory Visit (INDEPENDENT_AMBULATORY_CARE_PROVIDER_SITE_OTHER): Payer: Medicare Other | Admitting: Physician Assistant

## 2013-11-15 VITALS — BP 124/72 | HR 92 | Temp 97.9°F | Resp 18 | Wt 268.2 lb

## 2013-11-15 DIAGNOSIS — R079 Chest pain, unspecified: Secondary | ICD-10-CM

## 2013-11-15 DIAGNOSIS — E8729 Other acidosis: Secondary | ICD-10-CM

## 2013-11-15 DIAGNOSIS — E669 Obesity, unspecified: Secondary | ICD-10-CM

## 2013-11-15 DIAGNOSIS — E119 Type 2 diabetes mellitus without complications: Secondary | ICD-10-CM

## 2013-11-15 DIAGNOSIS — J449 Chronic obstructive pulmonary disease, unspecified: Secondary | ICD-10-CM

## 2013-11-15 DIAGNOSIS — I10 Essential (primary) hypertension: Secondary | ICD-10-CM

## 2013-11-15 DIAGNOSIS — E785 Hyperlipidemia, unspecified: Secondary | ICD-10-CM

## 2013-11-15 DIAGNOSIS — E872 Acidosis: Secondary | ICD-10-CM

## 2013-11-15 LAB — CBC WITH DIFFERENTIAL/PLATELET
BASOS ABS: 0 10*3/uL (ref 0.0–0.1)
Basophils Relative: 0 % (ref 0–1)
Eosinophils Absolute: 0.1 10*3/uL (ref 0.0–0.7)
Eosinophils Relative: 2 % (ref 0–5)
HCT: 41.8 % (ref 36.0–46.0)
Hemoglobin: 12.7 g/dL (ref 12.0–15.0)
LYMPHS PCT: 27 % (ref 12–46)
Lymphs Abs: 1.8 10*3/uL (ref 0.7–4.0)
MCH: 25.7 pg — ABNORMAL LOW (ref 26.0–34.0)
MCHC: 30.4 g/dL (ref 30.0–36.0)
MCV: 84.6 fL (ref 78.0–100.0)
Monocytes Absolute: 0.4 10*3/uL (ref 0.1–1.0)
Monocytes Relative: 6 % (ref 3–12)
NEUTROS ABS: 4.3 10*3/uL (ref 1.7–7.7)
Neutrophils Relative %: 65 % (ref 43–77)
Platelets: 244 10*3/uL (ref 150–400)
RBC: 4.94 MIL/uL (ref 3.87–5.11)
RDW: 14.7 % (ref 11.5–15.5)
WBC: 6.6 10*3/uL (ref 4.0–10.5)

## 2013-11-15 NOTE — Patient Instructions (Signed)
Nicotine inhaler What is this medicine? NICOTINE ([place]) helps people stop smoking. This medicine replaces the nicotine found in cigarettes and helps to decrease withdrawal effects. It is most effective when used in combination with a stop-smoking program. This medicine may be used for other purposes; ask your health care provider or pharmacist if you have questions. COMMON BRAND NAME(S): Nicotrol What should I tell my health care provider before I take this medicine? They need to know if you have any of these conditions: -diabetes -heart disease, angina, irregular heartbeat or previous heart attack -lung disease, including asthma -overactive thyroid -pheochromocytoma -stomach problems or ulcers -an unusual or allergic reaction to nicotine, other medicines, foods, dyes, or preservatives -pregnant or trying to get pregnant -breast-feeding How should I use this medicine? You should stop smoking completely before using the inhaler. Follow the directions carefully. Use exactly as directed. Do not use the inhaler more often than directed. Talk to your pediatrician regarding the use of this medicine in children. Special care may be needed. Overdosage: If you think you have taken too much of this medicine contact a poison control center or emergency room at once. NOTE: This medicine is only for you. Do not share this medicine with others. What if I miss a dose? If you miss a dose, use it as soon as you can. If it is almost time for your next dose, use only that dose. Do not use double or extra doses. What may interact with this medicine? -medicines for asthma -medicines for high blood pressure -medicines for mental depression This list may not describe all possible interactions. Give your health care provider a list of all the medicines, herbs, non-prescription drugs, or dietary supplements you use. Also tell them if you smoke, drink alcohol, or use illegal drugs. Some items may interact  with your medicine. What should I watch for while using this medicine? Always carry the inhaler with you. Do not smoke, chew nicotine gum, or use snuff while you are using this medicine. This reduces the chance of a nicotine overdose. If you are a diabetic and you quit smoking, the effects of insulin may be increased and you may need to reduce your insulin dose. Check with your doctor or health care professional about how you should adjust your insulin dose. What side effects may I notice from receiving this medicine? Side effects that you should report to your doctor or health care professional as soon as possible: -allergic reactions like skin rash, itching or hives, swelling of the face, lips, or tongue -breathing problems -changes in hearing -changes in vision -chest pain -cold sweats -confusion -fast, irregular heartbeat -feeling faint or lightheaded, falls -headache -increased saliva -nausea, vomiting -stomach pain -weakness Side effects that usually do not require medical attention (report to your doctor or health care professional if they continue or are bothersome): -diarrhea -dry mouth -hiccups -irritability -nervousness or restlessness -trouble sleeping or vivid dreams This list may not describe all possible side effects. Call your doctor for medical advice about side effects. You may report side effects to FDA at 1-800-FDA-1088. Where should I keep my medicine? Keep out of the reach of children. Store at room temperature below 25 degrees C (77 degrees F). Protect from heat and light. Throw away unused medicine after the expiration date. NOTE: This sheet is a summary. It may not cover all possible information. If you have questions about this medicine, talk to your doctor, pharmacist, or health care provider.  2014, [employer]vier/Gold Standard. (2010-11-06  13:02:05) Smoking Cessation Quitting smoking is important to your health and has many advantages. However, it is not  always easy to quit since nicotine is a very addictive drug. Often times, people try 3 times or more before being able to quit. This document explains the best ways for you to prepare to quit smoking. Quitting takes hard work and a lot of effort, but you can do it. ADVANTAGES OF QUITTING SMOKING  You will live longer, feel better, and live better.  Your body will feel the impact of quitting smoking almost immediately.  Within 20 minutes, blood pressure decreases. Your pulse returns to its normal level.  After 8 hours, carbon monoxide levels in the blood return to normal. Your oxygen level increases.  After 24 hours, the chance of having a heart attack starts to decrease. Your breath, hair, and body stop smelling like smoke.  After 48 hours, damaged nerve endings begin to recover. Your sense of taste and smell improve.  After 72 hours, the body is virtually free of nicotine. Your bronchial tubes relax and breathing becomes easier.  After 2 to 12 weeks, lungs can hold more air. Exercise becomes easier and circulation improves.  The risk of having a heart attack, stroke, cancer, or lung disease is greatly reduced.  After 1 year, the risk of coronary heart disease is cut in half.  After 5 years, the risk of stroke falls to the same as a nonsmoker.  After 10 years, the risk of lung cancer is cut in half and the risk of other cancers decreases significantly.  After 15 years, the risk of coronary heart disease drops, usually to the level of a nonsmoker.  If you are pregnant, quitting smoking will improve your chances of having a healthy baby.  The people you live with, especially any children, will be healthier.  You will have extra money to spend on things other than cigarettes. QUESTIONS TO THINK ABOUT BEFORE ATTEMPTING TO QUIT You may want to talk about your answers with your caregiver.  Why do you want to quit?  If you tried to quit in the past, what helped and what did  not?  What will be the most difficult situations for you after you quit? How will you plan to handle them?  Who can help you through the tough times? Your family? Friends? A caregiver?  What pleasures do you get from smoking? What ways can you still get pleasure if you quit? Here are some questions to ask your caregiver:  How can you help me to be successful at quitting?  What medicine do you think would be best for me and how should I take it?  What should I do if I need more help?  What is smoking withdrawal like? How can I get information on withdrawal? GET READY  Set a quit date.  Change your environment by getting rid of all cigarettes, ashtrays, matches, and lighters in your home, car, or work. Do not let people smoke in your home.  Review your past attempts to quit. Think about what worked and what did not. GET SUPPORT AND ENCOURAGEMENT You have a better chance of being successful if you have help. You can get support in many ways.  Tell your family, friends, and co-workers that you are going to quit and need their support. Ask them not to smoke around you.  Get individual, group, or telephone counseling and support. Programs are available at [employer] and health centers. Call your local health  department for information about programs in your area.  Spiritual beliefs and practices may help some smokers quit.  Download a "quit meter" on your computer to keep track of quit statistics, such as how long you have gone without smoking, cigarettes not smoked, and money saved.  Get a self-help book about quitting smoking and staying off of tobacco. [place] yourself from urges to smoke. Talk to someone, go for a walk, or occupy your time with a task.  Change your normal routine. Take a different route to work. Drink tea instead of coffee. Eat breakfast in a different place.  Reduce your stress. Take a hot bath, exercise, or read a  book.  Plan something enjoyable to do every day. Reward yourself for not smoking.  Explore interactive web-based programs that specialize in helping you quit. GET MEDICINE AND USE IT CORRECTLY Medicines can help you stop smoking and decrease the urge to smoke. Combining medicine with the above behavioral methods and support can greatly increase your chances of successfully quitting smoking.  Nicotine replacement therapy helps deliver nicotine to your body without the negative effects and risks of smoking. Nicotine replacement therapy includes nicotine gum, lozenges, inhalers, nasal sprays, and skin patches. Some may be available over-the-counter and others require a prescription.  Antidepressant medicine helps people abstain from smoking, but how this works is unknown. This medicine is available by prescription.  Nicotinic receptor partial agonist medicine simulates the effect of nicotine in your brain. This medicine is available by prescription. Ask your caregiver for advice about which medicines to use and how to use them based on your health history. Your caregiver will tell you what side effects to look out for if you choose to be on a medicine or therapy. Carefully read the information on the package. Do not use any other product containing nicotine while using a nicotine replacement product.  RELAPSE OR DIFFICULT SITUATIONS Most relapses occur within the first 3 months after quitting. Do not be discouraged if you start smoking again. Remember, most people try several times before finally quitting. You may have symptoms of withdrawal because your body is used to nicotine. You may crave cigarettes, be irritable, feel very hungry, cough often, get headaches, or have difficulty concentrating. The withdrawal symptoms are only temporary. They are strongest when you first quit, but they will go away within 10 14 days. To reduce the chances of relapse, try to:  Avoid drinking alcohol. Drinking lowers  your chances of successfully quitting.  Reduce the amount of caffeine you consume. Once you quit smoking, the amount of caffeine in your body increases and can give you symptoms, such as a rapid heartbeat, sweating, and anxiety.  Avoid smokers because they can make you want to smoke.  Do not let weight gain distract you. Many smokers will gain weight when they quit, usually less than 10 pounds. Eat a healthy diet and stay active. You can always lose the weight gained after you quit.  Find ways to improve your mood other than smoking. FOR MORE INFORMATION  www.smokefree.gov  Document Released: 08/27/2001 Document Revised: 03/03/2012 Document Reviewed: 12/12/2011 [facility] Patient Information 2014 [place], [place].

## 2013-11-15 NOTE — Progress Notes (Signed)
HPI Patient presents for a 3 month follow up for DM, HTN, cholesterol, obesity, COPD and continues to smoke. Patient was recently here for a 3 month follow up. She states that her breathing is better. Her Co2 was critically high likely from increasing her O2 to 9L and then 5 L. She was also diagnosed with a bronchitis and given ABX. Since that visit she states her breathing has gotten better and that she has her O2 on 4L and keeps at 92-94 and when she exertes herself she turns it up to 5 L.   Currently she is complaining of two episodes that occurred after an argument, and once during the snow. She began to have chest tightness, then felt tightness in her throat, and have left arm discomfort. She had no accompaniments, lasted for 2 hours one episode and 10 mins the other. She did not take her xanax at that time.   She is on thyroid medication. Her medication was changed last visit, one thyroid pill daily (200mg ) and 1.5 pill (300mg ) only on Sundays with an empty stomach. Patient denies diarrhea and heat / cold intolerance.  Lab Results  Component Value Date   TSH 0.107* 10/06/2013   Echo 2010 normal EF, last CXR 2009 She continues to smoke, she states that chantix is too expensive, she smokes a pack a day.    Past Medical History  Diagnosis Date  . Hypertension   . Hyperlipidemia   . Diabetes mellitus without complication   . Obesity   . Vitamin D deficiency   . COPD (chronic obstructive pulmonary disease)   . Arthritis   . GERD (gastroesophageal reflux disease)   . Depression   . Thyroid disease   . OAB (overactive bladder)      Allergies  Allergen Reactions  . Ace Inhibitors     cough  . Inderal [Propranolol]     Hair loss      Current Outpatient Prescriptions on File Prior to Visit  Medication Sig Dispense Refill  . acetaZOLAMIDE (DIAMOX) 250 MG tablet Take 250 mg by mouth 2 (two) times daily.      Marland Kitchen ALPRAZolam (XANAX) 0.5 MG tablet Take 0.5 mg by mouth at bedtime as  needed for anxiety.      Marland Kitchen aspirin 81 MG tablet Take 81 mg by mouth daily.      . benzonatate (TESSALON PERLES) 100 MG capsule Take 1 capsule (100 mg total) by mouth every 6 (six) hours as needed for cough.  90 capsule  1  . DULoxetine (CYMBALTA) 60 MG capsule Take 60 mg by mouth daily.      . famotidine (PEPCID) 20 MG tablet Take 20 mg by mouth 2 (two) times daily.      . furosemide (LASIX) 40 MG tablet Take 40 mg by mouth.      . levothyroxine (SYNTHROID, LEVOTHROID) 200 MCG tablet Take 200 mcg by mouth daily before breakfast.      . losartan (COZAAR) 100 MG tablet Take 1 tablet (100 mg total) by mouth daily.  90 tablet  0  . metFORMIN (GLUCOPHAGE-XR) 500 MG 24 hr tablet Take 500 mg by mouth 4 (four) times daily - after meals and at bedtime.      . OXYGEN-HELIUM IN Inhale 4 L into the lungs.      . pravastatin (PRAVACHOL) 40 MG tablet Take 40 mg by mouth daily.       No current facility-administered medications on file prior to visit.    ROS:  all negative expect above.   Physical: Filed Weights   11/15/13 1713  Weight: 268 lb 3.2 oz (121.655 kg)   Filed Vitals:   11/15/13 1713  BP: 124/72  Pulse: 92  Temp: 97.9 F (36.6 C)  Resp: 18   General Appearance: Well nourished, in no apparent distress. Eyes: PERRLA, EOMs. Sinuses: No Frontal/maxillary tenderness ENT/Mouth: Ext aud canals clear, normal light reflex with TMs without erythema, bulging. Post pharynx without erythema, swelling, exudate.  Respiratory: decreased breath sounds, no rales, rhonchi, wheezing.  Cardio: RRR, holosystolic murmur RSB, rubs or gallops. Peripheral pulses decreased bilaterally with 1-2 + edema.  Abdomen: Soft, obese, with bowl sounds. Nontender, no guarding, rebound. Lymphatics: Non tender without lymphadenopathy.  Musculoskeletal: Full ROM all peripheral extremities, 4/5 strength, and in wheel chair Skin: Warm, dry without rashes, lesions, ecchymosis.  Neuro: Cranial nerves intact, reflexes equal  bilaterally. Normal muscle tone, no cerebellar symptoms.  Pysch: Awake and oriented X 3, normal affect, Insight and Judgment appropriate.   Assessment and Plan: COPD- encouraged to quit smoking, get CXR, recheck BMP Smoking cessation- nicotine inhaler samples given with information, given patient assistance form for chantix CP- ? Angina but atypical CP- could be from COPD versus anxiety- can take xanax prn, and will send to cardiology.  Thyroid- recheck TSH DM- Discussed general issues about diabetes pathophysiology and management., Educational material distributed., Suggested low cholesterol diet., Encouraged aerobic exercise., Discussed foot care., Reminded to get yearly retinal exam. Hyperlipidemia- check LDL  OVER 40 minutes of exam, counseling, chart review, referral performed

## 2013-11-16 ENCOUNTER — Ambulatory Visit: Payer: Self-pay | Admitting: Internal Medicine

## 2013-11-16 ENCOUNTER — Encounter: Payer: Self-pay | Admitting: Physician Assistant

## 2013-11-16 LAB — LIPID PANEL
CHOL/HDL RATIO: 2.9 ratio
CHOLESTEROL: 165 mg/dL (ref 0–200)
HDL: 56 mg/dL (ref >39)
LDL Cholesterol: 82 mg/dL (ref 0–99)
Triglycerides: 133 mg/dL (ref <150)
VLDL: 27 mg/dL (ref 0–40)

## 2013-11-16 LAB — BASIC METABOLIC PANEL WITH GFR
BUN: 10 mg/dL (ref 6–23)
CHLORIDE: 97 meq/L (ref 96–112)
CO2: 37 mEq/L — ABNORMAL HIGH (ref 19–32)
Calcium: 9.3 mg/dL (ref 8.4–10.5)
Creat: 0.53 mg/dL (ref 0.50–1.10)
GFR, Est African American: >89 mL/min
Glucose, Bld: 95 mg/dL (ref 70–99)
POTASSIUM: 3.8 meq/L (ref 3.5–5.3)
Sodium: 144 mEq/L (ref 135–145)

## 2013-11-16 LAB — HEPATIC FUNCTION PANEL
ALT: 11 U/L (ref 0–35)
AST: 11 U/L (ref 0–37)
Albumin: 3.9 g/dL (ref 3.5–5.2)
Alkaline Phosphatase: 84 U/L (ref 39–117)
BILIRUBIN DIRECT: 0.1 mg/dL (ref 0.0–0.3)
BILIRUBIN INDIRECT: 0.2 mg/dL (ref 0.2–1.2)
TOTAL PROTEIN: 6.3 g/dL (ref 6.0–8.3)
Total Bilirubin: 0.3 mg/dL (ref 0.2–1.2)

## 2013-11-16 LAB — HEMOGLOBIN A1C
Hgb A1c MFr Bld: 6.5 % — ABNORMAL HIGH (ref <5.7)
Mean Plasma Glucose: 140 mg/dL — ABNORMAL HIGH (ref <117)

## 2013-11-16 LAB — INSULIN, FASTING: Insulin fasting, serum: 9 u[IU]/mL (ref 3–28)

## 2013-11-16 LAB — TSH: TSH: 0.095 u[IU]/mL — ABNORMAL LOW (ref 0.350–4.500)

## 2013-11-25 ENCOUNTER — Ambulatory Visit: Payer: Medicare Other | Admitting: Cardiology

## 2013-11-25 ENCOUNTER — Other Ambulatory Visit: Payer: Self-pay

## 2013-11-25 MED ORDER — LOSARTAN POTASSIUM 100 MG PO TABS: 100.0000 mg | ORAL_TABLET | Freq: Every day | ORAL | Status: DC

## 2013-12-07 ENCOUNTER — Ambulatory Visit: Payer: Medicare Other | Admitting: Internal Medicine

## 2013-12-14 ENCOUNTER — Ambulatory Visit: Payer: Medicare Other | Admitting: Cardiovascular Disease

## 2013-12-21 ENCOUNTER — Encounter: Payer: Self-pay | Admitting: Physician Assistant

## 2013-12-21 ENCOUNTER — Other Ambulatory Visit: Payer: Self-pay | Admitting: Internal Medicine

## 2013-12-21 ENCOUNTER — Ambulatory Visit (INDEPENDENT_AMBULATORY_CARE_PROVIDER_SITE_OTHER): Payer: Medicare Other | Admitting: Physician Assistant

## 2013-12-21 VITALS — BP 120/78 | HR 64 | Temp 97.9°F | Resp 16 | Wt 268.0 lb

## 2013-12-21 DIAGNOSIS — Z0000 Encounter for general adult medical examination without abnormal findings: Secondary | ICD-10-CM

## 2013-12-21 DIAGNOSIS — E039 Hypothyroidism, unspecified: Secondary | ICD-10-CM

## 2013-12-21 DIAGNOSIS — F17200 Nicotine dependence, unspecified, uncomplicated: Secondary | ICD-10-CM

## 2013-12-21 DIAGNOSIS — E2839 Other primary ovarian failure: Secondary | ICD-10-CM

## 2013-12-21 DIAGNOSIS — Z1331 Encounter for screening for depression: Secondary | ICD-10-CM

## 2013-12-21 DIAGNOSIS — E8729 Other acidosis: Secondary | ICD-10-CM

## 2013-12-21 DIAGNOSIS — E872 Acidosis: Secondary | ICD-10-CM

## 2013-12-21 MED ORDER — ACETAZOLAMIDE 250 MG PO TABS: 250.0000 mg | ORAL_TABLET | Freq: Three times a day (TID) | ORAL | Status: DC

## 2013-12-21 NOTE — Patient Instructions (Signed)

## 2013-12-21 NOTE — Progress Notes (Signed)
Subjective:   Jillian Wood is a 68 y.o. female with hisotry of DM, HTN, cholesterol, morbid obesity, COPD and continues to smoke  who presents for Medicare Annual Wellness Visit and 1 month follow up. Date of last medicare wellness visit is unknown.   Her blood pressure has been controlled at home, today their BP is BP: 120/78 mmHg She does not workout.  Last visit she had two episodes of atypical chest pain, she was suppose to get a CXR that she has not gotten and she was referred to cardiology, this has been rescheduled 2 times, now set for 01/06/2013. She states she has not had any CP since her last visit, she is taking 1/2 of the xanax daily which she states is helping with her nerves.   She is on thyroid medication. Her medication was changed last visit, 1 pill everyday and 1/2 pill on Sunday, she has not taken it today. Patient denies nervousness and palpitations.  Lab Results  Component Value Date   TSH 0.095* 11/15/2013  Last visit she was given a patient assistance paper for chantix, she forgot this at home.  She is on 4 L of continous O2,  She was recently treated for a bronchitis 3 months ago and her CO2 has been gradually coming down.  Lab Results  Component Value Date   CREATININE 0.53 11/15/2013   BUN 10 11/15/2013   NA 144 11/15/2013   K 3.8 11/15/2013   CL 97 11/15/2013   CO2 37* 11/15/2013   Echo 2010 normal EF, last CXR 2009    Names of Other Physician/Practitioners you currently use: 1. [facility]- here for primary care 2. Dr. Cheri Rous, eye doctor, last visit 12/2012, has appointment next week 3. 3 years, dentist,  Patient Care Team: Unk Pinto, MD as PCP - General (Internal Medicine)  Medication Review Current Outpatient Prescriptions on File Prior to Visit  Medication Sig Dispense Refill  . ALPRAZolam (XANAX) 0.5 MG tablet Take 0.5 mg by mouth at bedtime as needed for anxiety.      Marland Kitchen aspirin 81 MG tablet Take 81 mg by mouth  daily.      . benzonatate (TESSALON PERLES) 100 MG capsule Take 1 capsule (100 mg total) by mouth every 6 (six) hours as needed for cough.  90 capsule  1  . Cholecalciferol (VITAMIN D PO) Take 10,000 Units by mouth daily.      . DULoxetine (CYMBALTA) 60 MG capsule Take 60 mg by mouth daily.      . famotidine (PEPCID) 20 MG tablet Take 20 mg by mouth 2 (two) times daily.      . furosemide (LASIX) 40 MG tablet Take 40 mg by mouth.      Marland Kitchen guaiFENesin (MUCINEX) 600 MG 12 hr tablet Take 600 mg by mouth daily.      Marland Kitchen levothyroxine (SYNTHROID, LEVOTHROID) 200 MCG tablet Take 200 mcg by mouth daily before breakfast.      . losartan (COZAAR) 100 MG tablet Take 1 tablet (100 mg total) by mouth daily.  90 tablet  1  . Magnesium Chloride (MAGNESIUM DR PO) Take by mouth daily.      . metFORMIN (GLUCOPHAGE-XR) 500 MG 24 hr tablet Take 500 mg by mouth 4 (four) times daily - after meals and at bedtime.      . OXYGEN-HELIUM IN Inhale 4 L into the lungs.      . pravastatin (PRAVACHOL) 40 MG tablet Take 40 mg by mouth daily.  No current facility-administered medications on file prior to visit.    Current Problems (verified) Patient Active Problem List   Diagnosis Date Noted  . Encounter for long-term (current) use of other medications 11/06/2013  . Hypertension   . Hyperlipidemia   . Morbid obesity   . Vitamin D deficiency   . T2 NIDDM   . IRON DEFICIENCY 07/11/2010  . COPD 07/11/2010  . Personal history of colonic polyps 07/11/2010    Screening Tests Health Maintenance  Topic Date Due  . Zostavax  02/12/2006  . Influenza Vaccine  04/16/2014  . Mammogram  07/08/2014  . Tetanus/tdap  08/16/2018  . Colonoscopy  08/16/2020  . Pneumococcal Polysaccharide Vaccine Age 31 And Over  Completed     Immunization History  Administered Date(s) Administered  . Pneumococcal-Unspecified 05/16/2011  . Tdap 08/16/2008    Preventative care: Last colonoscopy: 2011 due 2016 Last mammogram: 09/2013  normal Last pap smear/pelvic exam: sees Dr. Carren Rang   DEXA:2005  Prior vaccinations: TD or Tdap: 2009  Influenza: 05/2013  Pneumococcal: 2012 Shingles/Zostavax: ?  History reviewed: allergies, current medications, past family history, past medical history, past social history, past surgical history and problem list  Risk Factors: Osteoporosis: postmenopausal estrogen deficiency and dietary calcium and/or vitamin D deficiency History of fracture in the past year: no  Tobacco History  Substance Use Topics  . Smoking status: Current Every Day Smoker -- 1.00 packs/day for 44 years    Types: Cigarettes  . Smokeless tobacco: Never Used  . Alcohol Use: No   She does smoke.  Are there smokers in your home (other than you)?  No  Alcohol Current alcohol use: none  Caffeine Current caffeine use: coffee 1-2 /day  Exercise Exercise limitations: The patient is experiencing exercise intolerance (due to morbid obesity and severe COPD). Current exercise: none  Nutrition/Diet Current diet: not asked  Cardiac risk factors: advanced age (older than 52 for men, 26 for women), diabetes mellitus, dyslipidemia, family history of premature cardiovascular disease, hypertension, obesity (BMI >= 30 kg/m2), sedentary lifestyle and smoking/ tobacco exposure.  Depression Screen (Note: if answer to either of the following is "Yes", a more complete depression screening is indicated)   Q1: Over the past two weeks, have you felt down, depressed or hopeless? No  Q2: Over the past two weeks, have you felt little interest or pleasure in doing things? No  Have you lost interest or pleasure in daily life? No  Do you often feel hopeless? No  Do you cry easily over simple problems? No  Activities of Daily Living In your present state of health, do you have any difficulty performing the following activities?:  Driving? Yes Managing money?  No Feeding yourself? No Getting from bed to chair? No Climbing a  flight of stairs? Yes Preparing food and eating?: No Bathing or showering? No Getting dressed: Yes Getting to the toilet? No Using the toilet:No Moving around from place to place: Yes but she uses her cane in the house and wheelchair outside of the house.  In the past year have you fallen or had a near fall?:No   Are you sexually active?  No  Do you have more than one partner?  No  Vision Difficulties: No  Hearing Difficulties: No Do you often ask people to speak up or repeat themselves? No Do you experience ringing or noises in your ears? No Do you have difficulty understanding soft or whispered voices? No  Cognition  Do you feel that you have a  problem with memory? Yes  Do you often misplace items? No  Do you feel safe at home?  Yes  Advanced directives Does patient have a [place]? No Does patient have a Living Will? No    Objective:     Vision and hearing screens reviewed.   Blood pressure 120/78, pulse 64, temperature 97.9 F (36.6 C), resp. rate 16. There is no weight on file to calculate BMI.  General appearance: alert, no distress, WD/WN,  female Cognitive Testing  Alert? Yes  Normal Appearance?Yes  Oriented to person? Yes  Place? Yes   Time? Yes  Recall of three objects?  Yes  Can perform simple calculations? Yes  Displays appropriate judgment?Yes  Can read the correct time from a watch face?Yes  General Appearance: Well nourished, in no apparent distress.  Eyes: PERRLA, EOMs.  Sinuses: No Frontal/maxillary tenderness  ENT/Mouth: Ext aud canals clear, normal light reflex with TMs without erythema, bulging. Post pharynx without erythema, swelling, exudate.  Respiratory: decreased breath sounds, no rales, rhonchi, wheezing.  Cardio: RRR, holosystolic murmur RSB, rubs or gallops. Peripheral pulses decreased bilaterally with 1-2 + edema.  Abdomen: Soft, obese, with bowl sounds. Nontender, no guarding, rebound.  Lymphatics: Non tender  without lymphadenopathy.  Musculoskeletal: Full ROM all peripheral extremities, 4/5 strength, and in wheel chair  Skin: Warm, dry without rashes, lesions, ecchymosis.  Neuro: Cranial nerves intact, reflexes equal bilaterally. Normal muscle tone, no cerebellar symptoms.  Pysch: Awake and oriented X 3, normal affect, Insight and Judgment appropriate.  Breast: defer Gyn: defer Rectal: defer   Assessment:   1. Unspecified hypothyroidism - TSH  2. CO2 retention - BASIC METABOLIC PANEL WITH GFR  3. Estrogen deficiency - DG Bone Density; Future   Plan:   During the course of the visit the patient was educated and counseled about appropriate screening and preventive services including:    Pneumococcal vaccine   Influenza vaccine  Td vaccine  Screening electrocardiogram  Screening mammography  Bone densitometry screening  Colorectal cancer screening  Diabetes screening  Glaucoma screening  Nutrition counseling   Smoking cessation counseling  Screening recommendations, referrals:  Vaccinations: Tdap vaccine not indicated Influenza vaccine next year Pneumococcal vaccine not indicated Shingles vaccine declined Hep B vaccine not indicated  Nutrition assessed and recommended  Colonoscopy not indicated Mammogram not due at this time, she is uptodate Pap smear not indicated Pelvic exam not indicated Recommended yearly ophthalmology/optometry visit for glaucoma screening and checkup Recommended yearly dental visit for hygiene and checkup Advanced directives - requested  Conditions/risks identified: BMI: Discussed weight loss, diet, and increase physical activity.  Increase physical activity: AHA recommends 150 minutes of physical activity a week.  Medications reviewed DEXA- ordered Diabetes is not at goal, ACE/ARB therapy: Yes. Urinary Incontinence is an issue: discussed non pharmacology and pharmacology options.  Fall risk: high- discussed PT, home fall  assessment, medications.   Medicare Attestation I have personally reviewed: The patient's medical and social history Their use of alcohol, tobacco or illicit drugs Their current medications and supplements The patient's functional ability including ADLs,fall risks, home safety risks, cognitive, and hearing and visual impairment Diet and physical activities Evidence for depression or mood disorders  The patient's weight, height, BMI, and visual acuity have been recorded in the chart.  I have made referrals, counseling, and provided education to the patient based on review of the above and I have provided the patient with a written personalized care plan for preventive services.  [name], PA-C   12/21/2013

## 2013-12-22 LAB — BASIC METABOLIC PANEL WITH GFR
BUN: 10 mg/dL (ref 6–23)
CHLORIDE: 94 meq/L — AB (ref 96–112)
CO2: 38 meq/L — AB (ref 19–32)
CREATININE: 0.57 mg/dL (ref 0.50–1.10)
Calcium: 9.3 mg/dL (ref 8.4–10.5)
GFR, Est African American: >89 mL/min
GFR, Est Non African American: >89 mL/min
GLUCOSE: 108 mg/dL — AB (ref 70–99)
Potassium: 4 mEq/L (ref 3.5–5.3)
Sodium: 139 mEq/L (ref 135–145)

## 2013-12-22 LAB — TSH: TSH: 0.15 u[IU]/mL — ABNORMAL LOW (ref 0.350–4.500)

## 2013-12-25 ENCOUNTER — Encounter: Payer: Self-pay | Admitting: Internal Medicine

## 2013-12-27 ENCOUNTER — Other Ambulatory Visit: Payer: Self-pay | Admitting: Internal Medicine

## 2013-12-27 DIAGNOSIS — K2100 Gastro-esophageal reflux disease with esophagitis, without bleeding: Secondary | ICD-10-CM

## 2013-12-27 MED ORDER — FAMOTIDINE 20 MG PO TABS: 20.0000 mg | ORAL_TABLET | Freq: Two times a day (BID) | ORAL | Status: DC

## 2014-01-06 ENCOUNTER — Encounter: Payer: Self-pay | Admitting: Cardiovascular Disease

## 2014-01-06 ENCOUNTER — Ambulatory Visit (INDEPENDENT_AMBULATORY_CARE_PROVIDER_SITE_OTHER): Payer: Medicare Other | Admitting: Cardiovascular Disease

## 2014-01-06 VITALS — BP 120/74 | HR 80 | Ht 65.0 in | Wt 268.0 lb

## 2014-01-06 DIAGNOSIS — R0602 Shortness of breath: Secondary | ICD-10-CM

## 2014-01-06 DIAGNOSIS — I10 Essential (primary) hypertension: Secondary | ICD-10-CM

## 2014-01-06 DIAGNOSIS — R079 Chest pain, unspecified: Secondary | ICD-10-CM

## 2014-01-06 DIAGNOSIS — E785 Hyperlipidemia, unspecified: Secondary | ICD-10-CM

## 2014-01-06 NOTE — Progress Notes (Signed)
01/06/2014 Jillian Wood   22-Oct-1945  [id number]  Primary Physician MCKEOWN,WILLIAM DAVID, MD Primary Cardiologist: Jillian Harp MD Jillian Wood   HPI:  Ms. Burrill is a very pleasant 68 year old severely overweight married Caucasian female mother of 3 children, grandmother of 2 grandchildren is accompanied by her husband was also a patient of mine. Marland Kitchen She was referred by Dr. Melford Aase for cardiovascular evaluation because of an episode of prolonged chest pressure. Her cardiovascular risk factor profile is notable for 50 pack years of tobacco abuse currently smoking one pack per day. She has treated hypertension, hyperlipidemia and non-insulin-requiring diabetes. There is no family history of heart disease. She has never had a heart attack stroke. 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 She had an episode of prolonged chest pressure several months ago with left upper extremity radiation.   Current Outpatient Prescriptions  Medication Sig Dispense Refill  . acetaZOLAMIDE (DIAMOX) 250 MG tablet Take 250 mg by mouth 2 (two) times daily.      Marland Kitchen ALPRAZolam (XANAX) 0.5 MG tablet Take 0.5 mg by mouth at bedtime as needed for anxiety.      Marland Kitchen aspirin 81 MG tablet Take 81 mg by mouth daily.      . benzonatate (TESSALON PERLES) 100 MG capsule Take 1 capsule (100 mg total) by mouth every 6 (six) hours as needed for cough.  90 capsule  1  . Cholecalciferol (VITAMIN D PO) Take 10,000 Units by mouth daily.      . DULoxetine (CYMBALTA) 60 MG capsule Take 60 mg by mouth daily.      . famotidine (PEPCID) 20 MG tablet Take 1 tablet (20 mg total) by mouth 2 (two) times daily. For acid reflux  180 tablet  99  . furosemide (LASIX) 40 MG tablet Take 40 mg by mouth.      Marland Kitchen guaiFENesin (MUCINEX) 600 MG 12 hr tablet Take 600 mg by mouth daily.      Marland Kitchen levothyroxine (SYNTHROID, LEVOTHROID) 200 MCG tablet Take 200 mcg by mouth daily before breakfast.      .  loratadine-pseudoephedrine (CLARITIN-D 24-HOUR) 10-240 MG per 24 hr tablet Take 1 tablet by mouth as needed.       Marland Kitchen losartan (COZAAR) 100 MG tablet Take 1 tablet (100 mg total) by mouth daily.  90 tablet  1  . Magnesium Chloride (MAGNESIUM DR PO) Take by mouth daily.      . metFORMIN (GLUCOPHAGE-XR) 500 MG 24 hr tablet Take 500 mg by mouth 4 (four) times daily - after meals and at bedtime.      . OXYGEN-HELIUM IN Inhale 4 L into the lungs.      . pravastatin (PRAVACHOL) 40 MG tablet Take 40 mg by mouth daily.       No current facility-administered medications for this visit.    Allergies  Allergen Reactions  . Ace Inhibitors     cough  . Inderal [Propranolol]     Hair loss    History   Social History  . Marital Status: Married    Spouse Name: N/A    Number of Children: N/A  . Years of Education: N/A   Occupational History  . Not on file.   Social History Main Topics  . Smoking status: Current Every Day Smoker -- 1.00 packs/day for 44 years    Types: Cigarettes  . Smokeless tobacco: Never Used  . Alcohol Use: No  . Drug Use: No  . Sexual  Activity: Not on file   Other Topics Concern  . Not on file   Social History Narrative  . No narrative on file     Review of Systems: General: negative for chills, fever, night sweats or weight changes.  Cardiovascular: negative for chest pain, dyspnea on exertion, edema, orthopnea, palpitations, paroxysmal nocturnal dyspnea or shortness of breath Dermatological: negative for rash Respiratory: negative for cough or wheezing Urologic: negative for hematuria Abdominal: negative for nausea, vomiting, diarrhea, bright red blood per rectum, melena, or hematemesis Neurologic: negative for visual changes, syncope, or dizziness All other systems reviewed and are otherwise negative except as noted above.    Blood pressure 120/74, pulse 80, height 5\' 5"  (1.651 m), weight 268 lb (121.564 kg).  General appearance: alert and no  distress Neck: no adenopathy, no carotid bruit, no JVD, supple, symmetrical, trachea midline and thyroid not enlarged, symmetric, no tenderness/mass/nodules Lungs: clear to auscultation bilaterally Heart: regular rate and rhythm, S1, S2 normal, no murmur, click, rub or gallop Extremities: extremities normal, atraumatic, no cyanosis or edema  EKG normal sinus rhythm at 80 without ST or T wave changes  ASSESSMENT AND PLAN:   Chest pain Patient was referred to me for evaluation of chest pain. She is a 68 year old morbidly overweight married Caucasian female with history of hypertension, hyperlipidemia, non-insulin-requiring diabetes and oxygen-dependent COPD. She had an episode of prolonged chest pressure several months ago with left upper extremity radiation. She's had no recurrent symptoms. Based on her risk factors and significant episode of chest pain and then to proceed with 2-D echocardiography suggestive of some double check murmur as well as dobutamine Myoview stress testing to rule out an ischemic etiology  Hyperlipidemia On statin therapy followed by her PCP  Hypertension Well-controlled on current medications      Jillian Harp MD [employer], [facility] 01/06/2014 4:38 PM

## 2014-01-06 NOTE — Patient Instructions (Signed)
  We will see you back in follow up after the tests  Dr Gwenlyn Found has ordered : 1.  Echocardiogram. Echocardiography is a painless test that uses sound waves to create images of your heart. It provides your doctor with information about the size and shape of your heart and how well your heart's chambers and valves are working. This procedure takes approximately one hour. There are no restrictions for this procedure.   2. Dobutamine Myoview- this is a test that looks at the blood flow to your heart muscle.  It takes approximately 2 1/2 hours. Please follow instruction sheet, as given.

## 2014-01-06 NOTE — Assessment & Plan Note (Signed)
Well-controlled on current medications 

## 2014-01-06 NOTE — Assessment & Plan Note (Signed)
On statin therapy followed by her PCP 

## 2014-01-06 NOTE — Assessment & Plan Note (Signed)
Patient was referred to me for evaluation of chest pain. She is a 68 year old morbidly overweight married Caucasian female with history of hypertension, hyperlipidemia, non-insulin-requiring diabetes and oxygen-dependent COPD. She had an episode of prolonged chest pressure several months ago with left upper extremity radiation. She's had no recurrent symptoms. Based on her risk factors and significant episode of chest pain and then to proceed with 2-D echocardiography suggestive of some double check murmur as well as dobutamine Myoview stress testing to rule out an ischemic etiology

## 2014-01-07 ENCOUNTER — Ambulatory Visit (HOSPITAL_COMMUNITY)
Admission: RE | Admit: 2014-01-07 | Discharge: 2014-01-07 | Disposition: A | Discharge status: Home / Self Care | Payer: Medicare Other | Source: Ambulatory Visit | Attending: Physician Assistant | Admitting: Physician Assistant

## 2014-01-07 DIAGNOSIS — J449 Chronic obstructive pulmonary disease, unspecified: Secondary | ICD-10-CM | POA: Insufficient documentation

## 2014-01-07 DIAGNOSIS — Z780 Asymptomatic menopausal state: Secondary | ICD-10-CM | POA: Insufficient documentation

## 2014-01-07 DIAGNOSIS — J9819 Other pulmonary collapse: Secondary | ICD-10-CM | POA: Insufficient documentation

## 2014-01-07 DIAGNOSIS — R079 Chest pain, unspecified: Secondary | ICD-10-CM

## 2014-01-07 DIAGNOSIS — F17200 Nicotine dependence, unspecified, uncomplicated: Secondary | ICD-10-CM | POA: Insufficient documentation

## 2014-01-07 DIAGNOSIS — E2839 Other primary ovarian failure: Secondary | ICD-10-CM

## 2014-01-07 DIAGNOSIS — Z13820 Encounter for screening for osteoporosis: Secondary | ICD-10-CM | POA: Insufficient documentation

## 2014-01-07 DIAGNOSIS — J4489 Other specified chronic obstructive pulmonary disease: Secondary | ICD-10-CM | POA: Insufficient documentation

## 2014-01-07 DIAGNOSIS — R0602 Shortness of breath: Secondary | ICD-10-CM | POA: Insufficient documentation

## 2014-01-20 ENCOUNTER — Telehealth (HOSPITAL_COMMUNITY): Payer: Self-pay

## 2014-01-20 ENCOUNTER — Other Ambulatory Visit: Payer: Self-pay

## 2014-01-21 ENCOUNTER — Telehealth (HOSPITAL_COMMUNITY): Payer: Self-pay

## 2014-01-25 ENCOUNTER — Ambulatory Visit (HOSPITAL_COMMUNITY)
Admission: RE | Admit: 2014-01-25 | Discharge: 2014-01-25 | Disposition: A | Discharge status: Home / Self Care | Payer: Medicare Other | Source: Ambulatory Visit | Attending: Cardiovascular Disease | Admitting: Cardiovascular Disease

## 2014-01-25 ENCOUNTER — Ambulatory Visit (INDEPENDENT_AMBULATORY_CARE_PROVIDER_SITE_OTHER): Payer: Medicare Other

## 2014-01-25 ENCOUNTER — Ambulatory Visit (HOSPITAL_BASED_OUTPATIENT_CLINIC_OR_DEPARTMENT_OTHER)
Admission: RE | Admit: 2014-01-25 | Discharge: 2014-01-25 | Disposition: A | Discharge status: Home / Self Care | Payer: Medicare Other | Source: Ambulatory Visit | Attending: Cardiovascular Disease | Admitting: Cardiovascular Disease

## 2014-01-25 DIAGNOSIS — R0602 Shortness of breath: Secondary | ICD-10-CM

## 2014-01-25 DIAGNOSIS — E039 Hypothyroidism, unspecified: Secondary | ICD-10-CM

## 2014-01-25 DIAGNOSIS — I359 Nonrheumatic aortic valve disorder, unspecified: Secondary | ICD-10-CM

## 2014-01-25 MED ORDER — DOBUTAMINE INFUSION FOR EP/ECHO/NUC (1000 MCG/ML)
0.0000 ug/kg/min | INTRAVENOUS | Status: DC
  Administered 2014-01-25: 40 ug/kg/min via INTRAVENOUS

## 2014-01-25 MED ORDER — TECHNETIUM TC 99M SESTAMIBI GENERIC - CARDIOLITE
10.0000 | Freq: Once | INTRAVENOUS | Status: AC | PRN
  Administered 2014-01-25: 10 via INTRAVENOUS

## 2014-01-25 MED ORDER — METOPROLOL TARTRATE 1 MG/ML IV SOLN
4.0000 mg | INTRAVENOUS | Status: AC | PRN
  Administered 2014-01-25: 4 mg via INTRAVENOUS

## 2014-01-25 MED ORDER — TECHNETIUM TC 99M SESTAMIBI GENERIC - CARDIOLITE
30.0000 | Freq: Once | INTRAVENOUS | Status: AC | PRN
  Administered 2014-01-25: 30 via INTRAVENOUS

## 2014-01-25 NOTE — Procedures (Addendum)
[facility] [place] [address] [place] [address] [phone]  Cardiology Nuclear Med Study  Jillian Wood is a 68 y.o. female     MRN : [id number]     DOB: June 16, 1946  Procedure Date: 01/25/2014  Nuclear Med Background Indication for Stress Test:  Evaluation for Ischemia History:  COPD and No prior NUC MPI for comparison. Cardiac Risk Factors: Hypertension, Lipids, NIDDM, Obesity and Smoker  Symptoms:  Chest Pain   Nuclear Pre-Procedure Caffeine/Decaff Intake:  8:00pm NPO After: 6:00am   IV Site: R Forearm  IV 0.9% NS with Angio Cath:  22g  Chest Size (in):  n/a IV Started by: Rolene Course, RN  Height: 5\' 5"  (1.651 m)  Cup Size: D  BMI:  Body mass index is 44.6 kg/(m^2). Weight:  268 lb (121.564 kg)   Tech Comments:  n/a    Nuclear Med Study 1 or 2 day study: 1 day  Stress Test Type:  Dobutamine  Order Authorizing Provider:  Quay Burow, MD   Resting Radionuclide: Technetium 76m Sestamibi  Resting Radionuclide Dose: 10.3 mCi   Stress Radionuclide:  Technetium 2m Sestamibi  Stress Radionuclide Dose: 30.9 mCi           Stress Protocol Rest HR: 81 Stress HR: 129  Rest BP: 104/58 Stress BP: 129/60  Exercise Time (min): n/a METS: n/a          Dose of Adenosine (mg):  n/a Dose of Lexiscan: n/a mg  Dose of Atropine (mg): n/a Dose of Dobutamine: 40 mcg/kg/min (at max HR)  Stress Test Technologist: Mellody Memos, CCT Nuclear Technologist: Otho Perl, CNMT   Rest Procedure:  Myocardial perfusion imaging was performed at rest 45 minutes following the intravenous administration of Technetium 66m Sestamibi. Stress Procedure:  The patient received IV dobutamine and no IV atropine.Technetium 49m Sestamibi was injected IV at peak heart rate and quantitative spect images were obtained after a 45 minute delay. Patient received 4 mg/4 ml IV slow push Lopressor by Rolene Course, RN per Dr. Quay Burow who remained at  bedside for administration.  Medication given due to SVT rate 222. Patient returned to baseline HR of 78.    Transient Ischemic Dilatation (Normal <1.22):  1.06 Lung/Heart Ratio (Normal <0.45):  0.26 QGS EDV:  75 ml QGS ESV:  23 ml LV Ejection Fraction: 69%        Rest ECG: NSR-RBBB  Stress ECG: Pt developed SVT with dobutamine which broke with IV BB  QPS Raw Data Images:  Normal; no motion artifact; normal heart/lung ratio. Stress Images:  Normal homogeneous uptake in all areas of the myocardium. Rest Images:  Normal homogeneous uptake in all areas of the myocardium. Subtraction (SDS):  No evidence of ischemia.  Impression Exercise Capacity:  Dobutamine study with no exercise. BP Response:  Normal blood pressure response. Clinical Symptoms:  Mild chest pain/dyspnea. ECG Impression:  SVT Comparison with Prior Nuclear Study: No images to compare  Overall Impression:  Normal stress nuclear study. and Images were nl but pt developed symptomatic SVT which reproduced her CP  LV Wall Motion:  NL LV Function; NL Wall Motion   Lorretta Harp, MD  01/25/2014 1:46 PM

## 2014-01-25 NOTE — Progress Notes (Signed)
2D Echocardiogram Complete.  01/25/2014   Deliah Boston, RDCS  Preliminary Technician Findings:  Mild Aortic Stenosis

## 2014-01-25 NOTE — Progress Notes (Signed)
Patient ID: Jillian Wood, female   DOB: 02/09/1946, 68 y.o.   MRN: [id number] Patient here today to recheck abnormal TSH. Patient is currently taking 200 mcg Levothyroxine, she takes a 1/2 pill on Monday, Wednesday and Friday and a whole pill all other days.

## 2014-01-26 ENCOUNTER — Ambulatory Visit: Payer: Self-pay

## 2014-01-26 LAB — TSH: TSH: 0.366 u[IU]/mL (ref 0.350–4.500)

## 2014-02-08 ENCOUNTER — Ambulatory Visit: Payer: Medicare Other | Admitting: Cardiovascular Disease

## 2014-02-22 ENCOUNTER — Encounter: Payer: Self-pay | Admitting: Internal Medicine

## 2014-02-22 ENCOUNTER — Ambulatory Visit (INDEPENDENT_AMBULATORY_CARE_PROVIDER_SITE_OTHER): Payer: Medicare Other | Admitting: Internal Medicine

## 2014-02-22 VITALS — BP 136/80 | HR 72 | Temp 97.7°F | Resp 18 | Ht 64.25 in | Wt 264.6 lb

## 2014-02-22 DIAGNOSIS — I10 Essential (primary) hypertension: Secondary | ICD-10-CM

## 2014-02-22 DIAGNOSIS — J449 Chronic obstructive pulmonary disease, unspecified: Secondary | ICD-10-CM

## 2014-02-22 DIAGNOSIS — Z79899 Other long term (current) drug therapy: Secondary | ICD-10-CM

## 2014-02-22 DIAGNOSIS — F17200 Nicotine dependence, unspecified, uncomplicated: Secondary | ICD-10-CM

## 2014-02-22 DIAGNOSIS — E039 Hypothyroidism, unspecified: Secondary | ICD-10-CM | POA: Insufficient documentation

## 2014-02-22 NOTE — Progress Notes (Signed)
Patient ID: Jillian Wood, female   DOB: Nov 25, 1945, 68 y.o.   MRN: [id number]    This very nice 68 yo MWF presents for 2 month follow up with Hypertension, Hyperlipidemia, Morbid Obesity,  Pre-Diabetes and Vitamin D Deficiency. Patient has end-stage O2 dependent COPD and continues to smoke despite ongoing counseling of the importance of smoking cessation in addition to avoiding exposure of her O2 to lighted cigarettes. Currently she is using O2 at flow rates of 3-4 LPM to achieve resting O2's of ~ 94-95%. Patient has been started on Diamox because of tendency toward CO2 retention.   HTN predates since the 1980's. BP has been controlled at home. Today's BP: 136/80 mmHg. Patient denies any cardiac type chest pain, palpitations, dyspnea/orthopnea/PND, dizziness, claudication, or dependent edema.   Hyperlipidemia is controlled with diet & meds. Last Lipids in Mar were at goal. Patient denies myalgias or other med SE's.  Lab Results  Component Value Date   CHOL 165 11/15/2013   HDL 56 11/15/2013   LDLCALC 82 11/15/2013   TRIG 133 11/15/2013   CHOLHDL 2.9 11/15/2013    Also, the patient has history of T2_NIDDM with A1c 6.7% in 2011 and last A1c was 6.5% in Mar 2015. Patient denies any symptoms of reactive hypoglycemia, diabetic polys, paresthesias or visual blurring.   Further, Patient has history of Vitamin D Deficiency of 9 in 2008 and last vitamin D was 57 in Dec 2014. Patient supplements vitamin D without any suspected side-effects.   Medication List   acetaZOLAMIDE 250 MG tablet  Commonly known as:  DIAMOX  Take 250 mg by mouth 2 (two) times daily.     ALPRAZolam 0.5 MG tablet  Commonly known as:  XANAX  Take 0.5 mg by mouth at bedtime as needed for anxiety.     aspirin 81 MG tablet  Take 81 mg by mouth daily.     DULoxetine 60 MG capsule  Commonly known as:  CYMBALTA  Take 60 mg by mouth daily.     famotidine 20 MG tablet  Commonly known as:  PEPCID  Take 1 tablet (20 mg total) by mouth 2  (two) times daily. For acid reflux     furosemide 40 MG tablet  Commonly known as:  LASIX  Take 40 mg by mouth.     guaiFENesin 600 MG 12 hr tablet  Commonly known as:  MUCINEX  Take 600 mg by mouth daily.     levothyroxine 200 MCG tablet  Commonly known as:  SYNTHROID, LEVOTHROID  Take 200 mcg by mouth daily before breakfast.     loratadine-pseudoephedrine 10-240 MG per 24 hr tablet  Commonly known as:  CLARITIN-D 24-hour  Take 1 tablet by mouth as needed.     losartan 100 MG tablet  Commonly known as:  COZAAR  Take 1 tablet (100 mg total) by mouth daily.     MAGNESIUM DR PO  Take by mouth daily.     metFORMIN 500 MG 24 hr tablet  Commonly known as:  GLUCOPHAGE-XR  Take 500 mg by mouth 4 (four) times daily - after meals and at bedtime.     OXYGEN-HELIUM IN  Inhale 4 L into the lungs.     pravastatin 40 MG tablet  Commonly known as:  PRAVACHOL  Take 40 mg by mouth daily.     VITAMIN D PO  Take 10,000 Units by mouth daily.      Allergies  Allergen Reactions  . Ace Inhibitors     cough  .  Inderal [Propranolol]     Hair loss   PMHx:   Past Medical History  Diagnosis Date  . Hypertension   . Hyperlipidemia   . Diabetes mellitus without complication   . Obesity   . Vitamin D deficiency   . COPD (chronic obstructive pulmonary disease)   . Arthritis   . GERD (gastroesophageal reflux disease)   . Depression   . Thyroid disease   . OAB (overactive bladder)    FHx:    Reviewed / unchanged  SHx:    Reviewed / unchanged   Systems Review: Constitutional: Denies fever, chills, wt changes, headaches, insomnia, fatigue, night sweats, change in appetite. Eyes: Denies redness, blurred vision, diplopia, discharge, itchy, watery eyes.  ENT: Denies discharge, congestion, post nasal drip, epistaxis, sore throat, earache, hearing loss, dental pain, tinnitus, vertigo, sinus pain, snoring.  CV: Denies chest pain, palpitations, irregular heartbeat, syncope, dyspnea,  diaphoresis, orthopnea, PND, claudication or edema. Respiratory: denies cough, dyspnea, DOE, pleurisy, hoarseness, laryngitis, wheezing.  Gastrointestinal: Denies dysphagia, odynophagia, heartburn, reflux, water brash, abdominal pain or cramps, nausea, vomiting, bloating, diarrhea, constipation, hematemesis, melena, hematochezia  or hemorrhoids. Genitourinary: Denies dysuria, frequency, urgency, nocturia, hesitancy, discharge, hematuria or flank pain. Musculoskeletal: Denies arthralgias, myalgias, stiffness, jt. swelling, pain, limping or strain/sprain.  Skin: Denies pruritus, rash, hives, warts, acne, eczema or change in skin lesion(s). Neuro: No weakness, tremor, incoordination, spasms, paresthesia or pain. Psychiatric: Denies confusion, memory loss or sensory loss. Endo: Denies change in weight, skin or hair change.  Heme/Lymph: No excessive bleeding, bruising or enlarged lymph nodes.   Exam:  BP 136/80  Pulse 72  Temp97.7 F   Resp 18  Ht 5' 4.25"   Wt 264 lb 9.6 oz   BMI 45.06 kg/m2  Appears well nourished - in no distress. Eyes: PERRLA, EOMs, conjunctiva no swelling or erythema. Sinuses: No frontal/maxillary tenderness ENT/Mouth: EAC's clear, TM's nl w/o erythema, bulging. Nares clear w/o erythema, swelling, exudates. Oropharynx clear without erythema or exudates. Oral hygiene is good. Tongue normal, non obstructing. Hearing intact.  Neck: Supple. Thyroid nl. Car 2+/2+ without bruits, nodes or JVD. Chest: Respirations nl with BS clear & equal w/o rales, rhonchi, wheezing or stridor.  Cor: Heart sounds normal w/ regular rate and rhythm without sig. murmurs, gallops, clicks, or rubs. Peripheral pulses normal and equal  without edema.  Abdomen: Soft & bowel sounds normal. Non-tender w/o guarding, rebound, hernias, masses, or organomegaly.  Lymphatics: Unremarkable.  Musculoskeletal: Full ROM all peripheral extremities, joint stability, 5/5 strength, and normal gait.  Skin: Warm,  dry without exposed rashes, lesions or ecchymosis apparent.  Neuro: Cranial nerves intact, reflexes equal bilaterally. Sensory-motor testing grossly intact. Tendon reflexes grossly intact.  Pysch: Alert & oriented x 3. Insight and judgement nl & appropriate. No ideations.  Assessment and Plan:  1. Hypertension - Continue monitor blood pressure at home. Continue diet/meds same.  2. Hyperlipidemia - Continue diet/meds, exercise,& lifestyle modifications. Continue monitor periodic cholesterol/liver & renal functions   3. T2_NIDDM - continue recommend prudent low glycemic diet, weight control, regular exercise, diabetic monitoring and periodic eye exams.  4. Vitamin D Deficiency - Continue supplementation.  5. End Stage O2 Dependent COPD - continue encourage smoking cessation  6. Hypothyroidism - recheck TSH after 2 recent dose changes.  Recommended regular exercise, BP monitoring, weight control, and discussed med and SE's. Recommended labs to assess and monitor clinical status. Further disposition pending results of labs.  Prognosis is guarded.

## 2014-02-22 NOTE — Patient Instructions (Signed)
Hypothyroidism The thyroid is a large gland located in the lower front of your neck. The thyroid gland helps control metabolism. Metabolism is how your body handles food. It controls metabolism with the hormone thyroxine. When this gland is underactive (hypothyroid), it produces too little hormone.  CAUSES These include:   Absence or destruction of thyroid tissue.  Goiter due to iodine deficiency.  Goiter due to medications.  Congenital defects (since birth).  Problems with the pituitary. This causes a lack of TSH (thyroid stimulating hormone). This hormone tells the thyroid to turn out more hormone. SYMPTOMS  Lethargy (feeling as though you have no energy)  Cold intolerance  Weight gain (in spite of normal food intake)  Dry skin  Coarse hair  Menstrual irregularity (if severe, may lead to infertility)  Slowing of thought processes Cardiac problems are also caused by insufficient amounts of thyroid hormone. Hypothyroidism in the newborn is cretinism, and is an extreme form. It is important that this form be treated adequately and immediately or it will lead rapidly to retarded physical and mental development. DIAGNOSIS  To prove hypothyroidism, your caregiver may do blood tests and ultrasound tests. Sometimes the signs are hidden. It may be necessary for your caregiver to watch this illness with blood tests either before or after diagnosis and treatment. TREATMENT  Low levels of thyroid hormone are increased by using synthetic thyroid hormone. This is a safe, effective treatment. It usually takes about four weeks to gain the full effects of the medication. After you have the full effect of the medication, it will generally take another four weeks for problems to leave. Your caregiver may start you on low doses. If you have had heart problems the dose may be gradually increased. It is generally not an emergency to get rapidly to normal. HOME CARE INSTRUCTIONS   Take your  medications as your caregiver suggests. Let your caregiver know of any medications you are taking or start taking. Your caregiver will help you with dosage schedules.  As your condition improves, your dosage needs may increase. It will be necessary to have continuing blood tests as suggested by your caregiver.  Report all suspected medication side effects to your caregiver. SEEK MEDICAL CARE IF: Seek medical care if you develop:  Sweating.  Tremulousness (tremors).  Anxiety.  Rapid weight loss.  Heat intolerance.  Emotional swings.  Diarrhea.  Weakness. SEEK IMMEDIATE MEDICAL CARE IF:  You develop chest pain, an irregular heart beat (palpitations), or a rapid heart beat. MAKE SURE YOU:   Understand these instructions.  Will watch your condition.  Will get help right away if you are not doing well or get worse. Document Released: 09/02/2005 Document Revised: 11/25/2011 Document Reviewed: 04/22/2008 [facility] Patient Information 2014 [employer].   Chronic Obstructive Pulmonary Disease Chronic obstructive pulmonary disease (COPD) is a common lung condition in which airflow from the lungs is limited. COPD is a general term that can be used to describe many different lung problems that limit airflow, including both chronic bronchitis and emphysema. If you have COPD, your lung function will probably never return to normal, but there are measures you can take to improve lung function and make yourself feel better.  CAUSES   Smoking (common).   Exposure to secondhand smoke.   Genetic problems.  Chronic inflammatory lung diseases or recurrent infections. SYMPTOMS   Shortness of breath, especially with physical activity.   Deep, persistent (chronic) cough with a large amount of thick mucus.   Wheezing.  Rapid breaths (tachypnea).   Gray or bluish discoloration (cyanosis) of the skin, especially in fingers, toes, or lips.   Fatigue.   Weight loss.    Frequent infections or episodes when breathing symptoms become much worse (exacerbations).   Chest tightness. DIAGNOSIS  Your healthcare provider will take a medical history and perform a physical examination to make the initial diagnosis. Additional tests for COPD may include:   Lung (pulmonary) function tests.  Chest X-ray.  CT scan.  Blood tests. TREATMENT  Treatment available to help you feel better when you have COPD include:   Inhaler and nebulizer medicines. These help manage the symptoms of COPD and make your breathing more comfortable  Supplemental oxygen. Supplemental oxygen is only helpful if you have a low oxygen level in your blood.   Exercise and physical activity. These are beneficial for nearly all people with COPD. Some people may also benefit from a pulmonary rehabilitation program. HOME CARE INSTRUCTIONS   Take all medicines (inhaled or pills) as directed by your health care provider.  Only take over-the-counter or prescription medicines for pain, fever, or discomfort as directed by your health care provider.   Avoid over-the-counter medicines or cough syrups that dry up your airway (such as antihistamines) and slow down the elimination of secretions unless instructed otherwise by your healthcare provider.   If you are a smoker, the most important thing that you can do is stop smoking. Continuing to smoke will cause further lung damage and breathing trouble. Ask your health care provider for help with quitting smoking. He or she can direct you to community resources or hospitals that provide support.  Avoid exposure to irritants such as smoke, chemicals, and fumes that aggravate your breathing.  Use oxygen therapy and pulmonary rehabilitation if directed by your health care provider. If you require home oxygen therapy, ask your healthcare provider whether you should purchase a pulse oximeter to measure your oxygen level at home.   Avoid contact with  individuals who have a contagious illness.  Avoid extreme temperature and humidity changes.  Eat healthy foods. Eating smaller, more frequent meals and resting before meals may help you maintain your strength.  Stay active, but balance activity with periods of rest. Exercise and physical activity will help you maintain your ability to do things you want to do.  Preventing infection and hospitalization is very important when you have COPD. Make sure to receive all the vaccines your health care provider recommends, especially the pneumococcal and influenza vaccines. Ask your healthcare provider whether you need a pneumonia vaccine.  Learn and use relaxation techniques to manage stress.  Learn and use controlled breathing techniques as directed by your health care provider. Controlled breathing techniques include:   Pursed lip breathing. Start by breathing in (inhaling) through your nose for 1 second. Then, purse your lips as if you were going to whistle and breathe out (exhale) through the pursed lips for 2 seconds.   Diaphragmatic breathing. Start by putting one hand on your abdomen just above your waist. Inhale slowly through your nose. The hand on your abdomen should move out. Then purse your lips and exhale slowly. You should be able to feel the hand on your abdomen moving in as you exhale.   Learn and use controlled coughing to clear mucus from your lungs. Controlled coughing is a series of short, progressive coughs. The steps of controlled coughing are:  1. Lean your head slightly forward.  2. Breathe in deeply using diaphragmatic breathing.  3. Try to hold your breath for 3 seconds.  4. Keep your mouth slightly open while coughing twice.  5. Spit any mucus out into a tissue.  6. Rest and repeat the steps once or twice as needed. SEEK MEDICAL CARE IF:   You are coughing up more mucus than usual.   There is a change in the color or thickness of your mucus.   Your  breathing is more labored than usual.   Your breathing is faster than usual.  SEEK IMMEDIATE MEDICAL CARE IF:   You have shortness of breath while you are resting.   You have shortness of breath that prevents you from:  Being able to talk.   Performing your usual physical activities.   You have chest pain lasting longer than 5 minutes.   Your skin color is more cyanotic than usual.  You measure low oxygen saturations for longer than 5 minutes with a pulse oximeter. MAKE SURE YOU:   Understand these instructions.  Will watch your condition.  Will get help right away if you are not doing well or get worse. Document Released: 06/12/2005 Document Revised: 06/23/2013 Document Reviewed: 04/29/2013 [facility] Patient Information 2014 [place], [place].

## 2014-02-23 LAB — BASIC METABOLIC PANEL WITH GFR
BUN: 22 mg/dL (ref 6–23)
CALCIUM: 9.1 mg/dL (ref 8.4–10.5)
CO2: 30 mEq/L (ref 19–32)
Chloride: 98 mEq/L (ref 96–112)
Creat: 0.84 mg/dL (ref 0.50–1.10)
GFR, EST AFRICAN AMERICAN: 83 mL/min
GFR, EST NON AFRICAN AMERICAN: 72 mL/min
Glucose, Bld: 106 mg/dL — ABNORMAL HIGH (ref 70–99)
Potassium: 4.2 mEq/L (ref 3.5–5.3)
SODIUM: 138 meq/L (ref 135–145)

## 2014-02-23 LAB — TSH: TSH: 2.7 u[IU]/mL (ref 0.350–4.500)

## 2014-03-04 ENCOUNTER — Ambulatory Visit: Payer: Medicare Other | Admitting: Cardiovascular Disease

## 2014-03-31 NOTE — Telephone Encounter (Signed)
Encounter complete. 

## 2014-04-12 ENCOUNTER — Ambulatory Visit: Payer: Medicare Other | Admitting: Cardiovascular Disease

## 2014-04-15 ENCOUNTER — Other Ambulatory Visit: Payer: Self-pay | Admitting: *Deleted

## 2014-04-15 MED ORDER — LEVOTHYROXINE SODIUM 200 MCG PO TABS: 200.0000 ug | ORAL_TABLET | Freq: Every day | ORAL | Status: DC

## 2014-05-25 ENCOUNTER — Encounter: Payer: Self-pay | Admitting: Internal Medicine

## 2014-05-25 DIAGNOSIS — Z0000 Encounter for general adult medical examination without abnormal findings: Secondary | ICD-10-CM

## 2014-06-13 ENCOUNTER — Other Ambulatory Visit: Payer: Self-pay | Admitting: *Deleted

## 2014-06-13 MED ORDER — LOSARTAN POTASSIUM 100 MG PO TABS: 100.0000 mg | ORAL_TABLET | Freq: Every day | ORAL | Status: DC

## 2014-07-14 ENCOUNTER — Ambulatory Visit (INDEPENDENT_AMBULATORY_CARE_PROVIDER_SITE_OTHER): Payer: Medicare Other | Admitting: Internal Medicine

## 2014-07-14 ENCOUNTER — Encounter: Payer: Self-pay | Admitting: Internal Medicine

## 2014-07-14 VITALS — BP 124/68 | HR 80 | Temp 97.7°F | Resp 18 | Ht 64.5 in | Wt 262.4 lb

## 2014-07-14 DIAGNOSIS — I10 Essential (primary) hypertension: Secondary | ICD-10-CM

## 2014-07-14 DIAGNOSIS — Z1212 Encounter for screening for malignant neoplasm of rectum: Secondary | ICD-10-CM

## 2014-07-14 DIAGNOSIS — E785 Hyperlipidemia, unspecified: Secondary | ICD-10-CM

## 2014-07-14 DIAGNOSIS — Z79899 Other long term (current) drug therapy: Secondary | ICD-10-CM

## 2014-07-14 DIAGNOSIS — Z1331 Encounter for screening for depression: Secondary | ICD-10-CM

## 2014-07-14 DIAGNOSIS — F17218 Nicotine dependence, cigarettes, with other nicotine-induced disorders: Secondary | ICD-10-CM

## 2014-07-14 DIAGNOSIS — Z0001 Encounter for general adult medical examination with abnormal findings: Secondary | ICD-10-CM

## 2014-07-14 DIAGNOSIS — J439 Emphysema, unspecified: Secondary | ICD-10-CM

## 2014-07-14 DIAGNOSIS — Z9181 History of falling: Secondary | ICD-10-CM

## 2014-07-14 DIAGNOSIS — E1121 Type 2 diabetes mellitus with diabetic nephropathy: Secondary | ICD-10-CM

## 2014-07-14 DIAGNOSIS — E0842 Diabetes mellitus due to underlying condition with diabetic polyneuropathy: Secondary | ICD-10-CM

## 2014-07-14 DIAGNOSIS — E559 Vitamin D deficiency, unspecified: Secondary | ICD-10-CM

## 2014-07-14 DIAGNOSIS — Z780 Asymptomatic menopausal state: Secondary | ICD-10-CM

## 2014-07-14 DIAGNOSIS — R6889 Other general symptoms and signs: Secondary | ICD-10-CM

## 2014-07-14 LAB — CBC WITH DIFFERENTIAL/PLATELET
BASOS PCT: 0 % (ref 0–1)
Basophils Absolute: 0 10*3/uL (ref 0.0–0.1)
Eosinophils Absolute: 0.1 10*3/uL (ref 0.0–0.7)
Eosinophils Relative: 1 % (ref 0–5)
HCT: 40.7 % (ref 36.0–46.0)
Hemoglobin: 12.7 g/dL (ref 12.0–15.0)
Lymphocytes Relative: 24 % (ref 12–46)
Lymphs Abs: 1.7 10*3/uL (ref 0.7–4.0)
MCH: 25.8 pg — ABNORMAL LOW (ref 26.0–34.0)
MCHC: 31.2 g/dL (ref 30.0–36.0)
MCV: 82.7 fL (ref 78.0–100.0)
Monocytes Absolute: 0.3 10*3/uL (ref 0.1–1.0)
Monocytes Relative: 5 % (ref 3–12)
NEUTROS ABS: 4.8 10*3/uL (ref 1.7–7.7)
NEUTROS PCT: 70 % (ref 43–77)
PLATELETS: 259 10*3/uL (ref 150–400)
RBC: 4.92 MIL/uL (ref 3.87–5.11)
RDW: 15.5 % (ref 11.5–15.5)
WBC: 6.9 10*3/uL (ref 4.0–10.5)

## 2014-07-14 MED ORDER — BUPROPION HCL ER (XL) 150 MG PO TB24: ORAL_TABLET | ORAL | Status: DC

## 2014-07-14 MED ORDER — VARENICLINE TARTRATE 1 MG PO TABS: ORAL_TABLET | ORAL | Status: DC

## 2014-07-14 NOTE — Progress Notes (Signed)
Patient ID: Jillian Wood, female   DOB: [birth year], 68 y.o.   MRN: [id number]  Annual Screening Comprehensive Examination  This very nice 68 y.o.MWF presents for complete physical.  Patient has been followed for HTN, O2 Dependent COPD, Morbid Obesity,  T2_NIDDM, Hyperlipidemia, and Vitamin D Deficiency.    HTN predates since  The 1980's. Patient's BP has been controlled at home and patient denies any cardiac symptoms as chest pain, palpitations, shortness of breath, dizziness or ankle swelling. Today's BP: 124/68 mmHg    Patient's hyperlipidemia is controlled with diet and medications. Patient denies myalgias or other medication SE's. Last lipids were Total Chol 165; HDL 56; LDL  82; Trig 133 on 11/15/2013.   Patient has Morbid Obesity (BMI 44.32) and consequent T2_NIDDM  And on treatment since Sept 2014 and patient denies reactive hypoglycemic symptoms, visual blurring or diabetic polys, but she does report some numb and tingling paresthesias of her toes. She alleges CBG's range less than 160 mg%. Last A1c was  6.5% on 11/15/2013.   Finally, patient has history of Vitamin D Deficiency and last Vitamin D was 57 on 09/14/2013.  Medication Sig  . acetaZOLAMIDE (DIAMOX) 250 MG tablet Take 250 mg by mouth 2 (two) times daily.  Marland Kitchen ALPRAZolam (XANAX) 0.5 MG tablet Take 0.5 mg by mouth at bedtime as needed for anxiety.  Marland Kitchen aspirin 81 MG tablet Take 81 mg by mouth daily.  . Cholecalciferol (VITAMIN D PO) Take 10,000 Units by mouth daily.  . DULoxetine (CYMBALTA) 60 MG capsule Take 60 mg by mouth daily.  . famotidine (PEPCID) 20 MG tablet Take 1 tablet (20 mg total) by mouth 2 (two) times daily. For acid reflux  . furosemide (LASIX) 40 MG tablet Take 40 mg by mouth.  Marland Kitchen guaiFENesin (MUCINEX) 600 MG 12 hr tablet Take 600 mg by mouth daily.  Marland Kitchen levothyroxine (SYNTHROID, LEVOTHROID) 200 MCG tablet Take 1 tablet (200 mcg total) by mouth daily before breakfast.  . loratadine-pseudoephedrine (CLARITIN-D 24-HOUR)  10-240 MG per 24 hr tablet Take 1 tablet by mouth as needed.   Marland Kitchen losartan (COZAAR) 100 MG tablet Take 1 tablet (100 mg total) by mouth daily.  . Magnesium Chloride (MAGNESIUM DR PO) Take by mouth daily.  . metFORMIN (GLUCOPHAGE-XR) 500 MG 24 hr tablet Take 500 mg by mouth 4 (four) times daily - after meals and at bedtime.  . OXYGEN-HELIUM IN Inhale 3 L into the lungs.   . pravastatin (PRAVACHOL) 40 MG tablet Take 40 mg by mouth daily.   Allergies  Allergen Reactions  . Ace Inhibitors     cough  . Inderal [Propranolol]     Hair loss   Past Medical History  Diagnosis Date  . Hypertension   . Hyperlipidemia   . Diabetes mellitus without complication   . Obesity   . Vitamin D deficiency   . COPD (chronic obstructive pulmonary disease)   . Arthritis   . GERD (gastroesophageal reflux disease)   . Depression   . Thyroid disease   . OAB (overactive bladder)    Health Maintenance  Topic Date Due  . Foot Exam  02/13/1956  . Ophthalmology Exam  02/13/1956  . Urine Microalbumin  02/13/1956  . Zostavax  02/12/2006  . Influenza Vaccine  04/16/2014  . Hemoglobin A1c  05/18/2014  . Mammogram  10/07/2015  . Tetanus/tdap  08/16/2018  . Colonoscopy  08/16/2020  . Pneumococcal Polysaccharide Vaccine Age 37 And Over  Completed   Immunization History  Administered Date(s) Administered  .  Pneumococcal-Unspecified 05/16/2011  . Tdap 08/16/2008   Past Surgical History  Procedure Laterality Date  . Carpal tunnel release      L 1992 R 1984  . Eye surgery Bilateral     cataract  . Pubovaginal sling    . Tonsillectomy and adenoidectomy     Family History  Problem Relation Age of Onset  . Alcohol abuse Father   . Heart disease Father    History  Substance Use Topics  . Smoking status: Current Every Day Smoker -- 1.00 packs/day for 44 years    Types: Cigarettes  . Smokeless tobacco: Never Used  . Alcohol Use: No    ROS Constitutional: Denies fever, chills, weight loss/gain,  headaches, insomnia, fatigue, night sweats, and change in appetite. Eyes: Denies redness, blurred vision, diplopia, discharge, itchy, watery eyes.  ENT: Denies discharge, congestion, post nasal drip, epistaxis, sore throat, earache, hearing loss, dental pain, Tinnitus, Vertigo, Sinus pain, snoring.  Cardio: Denies chest pain, palpitations, irregular heartbeat, syncope, dyspnea, diaphoresis, orthopnea, PND, claudication, edema Respiratory: denies cough, dyspnea, DOE, pleurisy, hoarseness, laryngitis, wheezing.  Gastrointestinal: Denies dysphagia, heartburn, reflux, water brash, pain, cramps, nausea, vomiting, bloating, diarrhea, constipation, hematemesis, melena, hematochezia, jaundice, hemorrhoids Genitourinary: Denies dysuria, frequency, urgency, nocturia, hesitancy, discharge, hematuria, flank pain Breast: Breast lumps, nipple discharge, bleeding.  Musculoskeletal: Denies arthralgia, myalgia, stiffness, Jt. Swelling, pain, limp, and strain/sprain. Denies falls. Skin: Denies puritis, rash, hives, warts, acne, eczema, changing in skin lesion Neuro: No weakness, tremor, incoordination, spasms, paresthesia, pain Psychiatric: Denies confusion, memory loss, sensory loss. Denies Depression. Endocrine: Denies change in weight, skin, hair change, nocturia, and paresthesia, diabetic polys, visual blurring, hyper / hypo glycemic episodes.  Heme/Lymph: No excessive bleeding, bruising, enlarged lymph nodes.  Physical Exam  BP 124/68  Pulse 80  Temp 97.7 F   Resp 18  Ht 5' 4.5"   Wt 262 lb 6.4 oz   BMI 44.36   General Appearance: Over nourished and in no apparent distress. Strong pungent/rancid tobacco fetor and body odor. Eyes: PERRLA, EOMs, conjunctiva no swelling or erythema, normal fundi and vessels. Sinuses: No frontal/maxillary tenderness ENT/Mouth: EACs patent / TMs  nl. Nares clear without erythema, swelling, mucoid exudates. Oral hygiene is good. No erythema, swelling, or exudate. Tongue  normal, non-obstructing. Tonsils not swollen or erythematous. Hearing normal.  Neck: Supple, thyroid normal. No bruits, nodes or JVD. Respiratory: Respiratory effort normal.  BS very distant without rales, rhonci, wheezing or stridor. Cardio: Heart sounds are normal with regular rate and rhythm and no murmurs, rubs or gallops. Peripheral pulses are normal and equal bilaterally without edema. No aortic or femoral bruits. Chest: Increased AP diameter symmetric with normal excursions and percussion. Breasts: Symmetric, without lumps, nipple discharge, retractions, or fibrocystic changes.  Abdomen: Flat, soft, with bowl sounds. Nontender, no guarding, rebound, hernias, masses, or organomegaly.  Lymphatics: Non tender without lymphadenopathy.  Musculoskeletal: Full ROM all peripheral extremities, joint stability, 5/5 strength, and normal gait. Skin: Warm and dry without rashes, lesions, cyanosis, clubbing or  ecchymosis.  Neuro: Cranial nerves intact, reflexes equal bilaterally. Normal muscle tone, no cerebellar symptoms. Slight decreased sensation in distal feet & toes by monofilament testingSensation intact.  Pysch: Awake and oriented X 3, normal affect, Insight and Judgment appropriate.   Assessment and Plan  1. Annual Screening Examination 2. Hypertension  3. Hyperlipidemia 4. 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 Vitamin D Deficiency 6. COPD, Severe-O2 Dependent 7. Morbid Obesity 8. Nicotine Addiction   Continue prudent diet as discussed, weight control,  BP monitoring, regular exercise, and medications. Discussed med's effects and SE's. Screening labs and tests as requested with regular follow-up as recommended.  Continues to smoke 1 PPD. Discussed benefits of smoking cessation as well as techniques for quitting.  Patient given Rx's for Chantix and Bupropion and given written instructions on how to titrate doses of both slowly to avoid SE's.

## 2014-07-14 NOTE — Patient Instructions (Addendum)
Stop Cymbalta   Then   Start Wellbutrin(Bupropion) 150 mg Xl - take 1 tablet daily for 2-3 weeks,  Then increase to 2 tablets every morning,  Also, Start Chantix 1  Mg   Take 1/2 tablet /day for 1 week,  Then   1/2 tablet 2 x day for 1 week,   then 1/2 tablet 2 x day for 2sd week,  Then   1/2 tablet 3 x day for 3rd week  Then may increase to 1 whole tablet 2 x day              +++++++++++++++++++++++++++++++++++++++++++++++++++++++++  Recommend the book "The END of DIETING" by Dr Baker Janus   and the book "The END of DIABETES " by Dr Excell Seltzer  At [email or web address] - get book & Audio CD's      Being diabetic has a  300% increased risk for heart attack, stroke, cancer, and alzheimer- type vascular dementia. It is very important that you work harder with diet by avoiding all foods that are white except chicken & fish. Avoid white rice (brown & wild rice is OK), white potatoes (sweetpotatoes in moderation is OK), White bread or wheat bread or anything made out of white flour like bagels, donuts, rolls, buns, biscuits, cakes, pastries, cookies, pizza crust, and pasta (made from white flour & egg whites) - vegetarian pasta or spinach or wheat pasta is OK. Multigrain breads like Arnold's or [employer], or multigrain sandwich thins or flatbreads.  Diet, exercise and weight loss can reverse and cure diabetes in the early stages.  Diet, exercise and weight loss is very important in the control and prevention of complications of diabetes which affects every system in your body, ie. Brain - dementia/stroke, eyes - glaucoma/blindness, heart - heart attack/heart failure, kidneys - dialysis, stomach - gastric paralysis, intestines - malabsorption, nerves - severe painful neuritis, circulation - gangrene & loss of a leg(s), and finally cancer and Alzheimers.    I recommend avoid fried & greasy foods,  sweets/candy, white rice (brown or wild rice or Quinoa is OK), white potatoes  (sweet potatoes are OK) - anything made from white flour - bagels, doughnuts, rolls, buns, biscuits,white and wheat breads, pizza crust and traditional pasta made of white flour & egg white(vegetarian pasta or spinach or wheat pasta is OK).  Multi-grain bread is OK - like multi-grain flat bread or sandwich thins. Avoid alcohol in excess. Exercise is also important.    Eat all the vegetables you want - avoid meat, especially red meat and dairy - especially cheese.  Cheese is the most concentrated form of trans-fats which is the worst thing to clog up our arteries. Veggie cheese is OK which can be found in the fresh produce section at [employer] or [employer] or [employer]  Preventive Care for Adults  A healthy lifestyle and preventive care can promote health and wellness. Preventive health guidelines for women include the following key practices.  A routine yearly physical is a good way to check with your health care provider about your health and preventive screening. It is a chance to share any concerns and updates on your health and to receive a thorough exam.  Visit your dentist for a routine exam and preventive care every 6 months. Brush your teeth twice a day and floss once a day. Good oral hygiene prevents tooth decay and gum disease.  The frequency of eye exams is based on your age, health, family medical history, use of contact lenses,  and other factors. Follow your health care provider's recommendations for frequency of eye exams.  Eat a healthy diet. Foods like vegetables, fruits, whole grains, low-fat dairy products, and lean protein foods contain the nutrients you need without too many calories. Decrease your intake of foods high in solid fats, added sugars, and salt. Eat the right amount of calories for you.Get information about a proper diet from your health care provider, if necessary.  Regular physical exercise is one of the most important things you can do for your health. Most  adults should get at least 150 minutes of moderate-intensity exercise (any activity that increases your heart rate and causes you to sweat) each week. In addition, most adults need muscle-strengthening exercises on 2 or more days a week.  Maintain a healthy weight. The body mass index (BMI) is a screening tool to identify possible weight problems. It provides an estimate of body fat based on height and weight. Your health care provider can find your BMI and can help you achieve or maintain a healthy weight.For adults 20 years and older:  A BMI of 18.5 to 24.9 is normal.  A BMI of 25 to 29.9 is considered overweight.  A BMI of 30 and above is considered obese.  Maintain normal blood lipids and cholesterol levels by exercising and minimizing your intake of saturated fat. Eat a balanced diet with plenty of fruit and vegetables. If your lipid or cholesterol levels are high, you are over 50, or you are at high risk for heart disease, you may need your cholesterol levels checked more frequently.Ongoing high lipid and cholesterol levels should be treated with medicines if diet and exercise are not working.  If you smoke, find out from your health care provider how to quit. If you do not use tobacco, do not start.  Lung cancer screening is recommended for adults aged 36-80 years who are at high risk for developing lung cancer because of a history of smoking. A yearly low-dose CT scan of the lungs is recommended for people who have at least a 30-pack-year history of smoking and are a current smoker or have quit within the past 15 years. A pack year of smoking is smoking an average of 1 pack of cigarettes a day for 1 year (for example: 1 pack a day for 30 years or 2 packs a day for 15 years). Yearly screening should continue until the smoker has stopped smoking for at least 15 years. Yearly screening should be stopped for people who develop a health problem that would prevent them from having lung cancer  treatment.  High blood pressure causes heart disease and increases the risk of stroke.  Ongoing high blood pressure should be treated with medicines if weight loss and exercise do not work.  If you are 79-5 years old, ask your health care provider if you should take aspirin to prevent strokes.    Diabetes screening involves taking a blood sample to check your fasting blood sugar level. This should be done once every 3 years, after age 68, if you are within normal weight and without risk factors for diabetes. Testing should be considered at a younger age or be carried out more frequently if you are overweight and have at least 1 risk factor for diabetes.    Breast cancer screening is essential preventive care for women. You should practice "breast self-awareness." This means understanding the normal appearance and feel of your breasts and may include breast self-examination. Any changes detected, no matter  how small, should be reported to a health care provider. Women in their 52s and 30s should have a clinical breast exam (CBE) by a health care provider as part of a regular health exam every 1 to 3 years. After age 57, women should have a CBE every year. Starting at age 27, women should consider having a mammogram (breast X-ray test) every year. Women who have a family history of breast cancer should talk to their health care provider about genetic screening. Women at a high risk of breast cancer should talk to their health care providers about having an MRI and a mammogram every year.  Breast cancer gene (BRCA)-related cancer risk assessment is recommended for women who have family members with BRCA-related cancers. BRCA-related cancers include breast, ovarian, tubal, and peritoneal cancers. Having family members with these cancers may be associated with an increased risk for harmful changes (mutations) in the breast cancer genes BRCA1 and BRCA2. Results of the assessment will determine the need for  genetic counseling and BRCA1 and BRCA2 testing.  Routine pelvic exams to screen for cancer are no longer recommended for nonpregnant women who are considered low risk for cancer of the pelvic organs (ovaries, uterus, and vagina) and who do not have symptoms. Ask your health care provider if a screening pelvic exam is right for you.  If you have had past treatment for cervical cancer or a condition that could lead to cancer, you need Pap tests and screening for cancer for at least 20 years after your treatment. If Pap tests have been discontinued, your risk factors (such as having a new sexual partner) need to be reassessed to determine if screening should be resumed. Some women have medical problems that increase the chance of getting cervical cancer. In these cases, your health care provider may recommend more frequent screening and Pap tests.    Colorectal cancer can be detected and often prevented. Most routine colorectal cancer screening begins at the age of 74 years and continues through age 50 years. However, your health care provider may recommend screening at an earlier age if you have risk factors for colon cancer. On a yearly basis, your health care provider may provide home test kits to check for hidden blood in the stool. Use of a small camera at the end of a tube, to directly examine the colon (sigmoidoscopy or colonoscopy), can detect the earliest forms of colorectal cancer. Talk to your health care provider about this at age 52, when routine screening begins. Direct exam of the colon should be repeated every 5-10 years through age 81 years, unless early forms of pre-cancerous polyps or small growths are found.  Osteoporosis is a disease in which the bones lose minerals and strength with aging. This can result in serious bone fractures or breaks. The risk of osteoporosis can be identified using a bone density scan. Women ages 68 years and over and women at risk for fractures or osteoporosis  should discuss screening with their health care providers. Ask your health care provider whether you should take a calcium supplement or vitamin D to reduce the rate of osteoporosis.  Menopause can be associated with physical symptoms and risks. Hormone replacement therapy is available to decrease symptoms and risks. You should talk to your health care provider about whether hormone replacement therapy is right for you.  Use sunscreen. Apply sunscreen liberally and repeatedly throughout the day. You should seek shade when your shadow is shorter than you. Protect yourself by wearing long sleeves,  pants, a wide-brimmed hat, and sunglasses year round, whenever you are outdoors.  Once a month, do a whole body skin exam, using a mirror to look at the skin on your back. Tell your health care provider of new moles, moles that have irregular borders, moles that are larger than a pencil eraser, or moles that have changed in shape or color.  Stay current with required vaccines (immunizations).  Influenza vaccine. All adults should be immunized every year.  Tetanus, diphtheria, and acellular pertussis (Td, Tdap) vaccine. Pregnant women should receive 1 dose of Tdap vaccine during each pregnancy. The dose should be obtained regardless of the length of time since the last dose. Immunization is preferred during the 27th-36th week of gestation. An adult who has not previously received Tdap or who does not know her vaccine status should receive 1 dose of Tdap. This initial dose should be followed by tetanus and diphtheria toxoids (Td) booster doses every 10 years. Adults with an unknown or incomplete history of completing a 3-dose immunization series with Td-containing vaccines should begin or complete a primary immunization series including a Tdap dose. Adults should receive a Td booster every 10 years.    Zoster vaccine. One dose is recommended for adults aged 14 years or older unless certain conditions are  present.    Pneumococcal 13-valent conjugate (PCV13) vaccine. When indicated, a person who is uncertain of her immunization history and has no record of immunization should receive the PCV13 vaccine. An adult aged 24 years or older who has certain medical conditions and has not been previously immunized should receive 1 dose of PCV13 vaccine. This PCV13 should be followed with a dose of pneumococcal polysaccharide (PPSV23) vaccine. The PPSV23 vaccine dose should be obtained at least 8 weeks after the dose of PCV13 vaccine. An adult aged 95 years or older who has certain medical conditions and previously received 1 or more doses of PPSV23 vaccine should receive 1 dose of PCV13. The PCV13 vaccine dose should be obtained 1 or more years after the last PPSV23 vaccine dose.    Pneumococcal polysaccharide (PPSV23) vaccine. When PCV13 is also indicated, PCV13 should be obtained first. All adults aged 51 years and older should be immunized. An adult younger than age 8 years who has certain medical conditions should be immunized. Any person who resides in a nursing home or long-term care facility should be immunized. An adult smoker should be immunized. People with an immunocompromised condition and certain other conditions should receive both PCV13 and PPSV23 vaccines. People with human immunodeficiency virus (HIV) infection should be immunized as soon as possible after diagnosis. Immunization during chemotherapy or radiation therapy should be avoided. Routine use of PPSV23 vaccine is not recommended for American Indians, [place] Natives, or people younger than 65 years unless there are medical conditions that require PPSV23 vaccine. When indicated, people who have unknown immunization and have no record of immunization should receive PPSV23 vaccine. One-time revaccination 5 years after the first dose of PPSV23 is recommended for people aged 19-64 years who have chronic kidney failure, nephrotic syndrome, asplenia,  or immunocompromised conditions. People who received 1-2 doses of PPSV23 before age 51 years should receive another dose of PPSV23 vaccine at age 38 years or later if at least 5 years have passed since the previous dose. Doses of PPSV23 are not needed for people immunized with PPSV23 at or after age 22 years.   Preventive Services / Frequency  Ages 50 years and over  Blood pressure check.  Lipid and cholesterol check.  Lung cancer screening. / Every year if you are aged 12-80 years and have a 30-pack-year history of smoking and currently smoke or have quit within the past 15 years. Yearly screening is stopped once you have quit smoking for at least 15 years or develop a health problem that would prevent you from having lung cancer treatment.  Clinical breast exam.** / Every year after age 53 years.  BRCA-related cancer risk assessment.** / For women who have family members with a BRCA-related cancer (breast, ovarian, tubal, or peritoneal cancers).  Mammogram.** / Every year beginning at age 52 years and continuing for as long as you are in good health. Consult with your health care provider.  Pap test.** / Every 3 years starting at age 51 years through age 20 or 23 years with 3 consecutive normal Pap tests. Testing can be stopped between 65 and 70 years with 3 consecutive normal Pap tests and no abnormal Pap or HPV tests in the past 10 years.  Fecal occult blood test (FOBT) of stool. / Every year beginning at age 17 years and continuing until age 81 years. You may not need to do this test if you get a colonoscopy every 10 years.  Flexible sigmoidoscopy or colonoscopy.** / Every 5 years for a flexible sigmoidoscopy or every 10 years for a colonoscopy beginning at age 67 years and continuing until age 68 years.  Hepatitis C blood test.** / For all people born from [birth year] through 1965 and any individual with known risks for hepatitis C.  Osteoporosis screening.** / A one-time screening for  women ages 72 years and over and women at risk for fractures or osteoporosis.  Skin self-exam. / Monthly.  Influenza vaccine. / Every year.  Tetanus, diphtheria, and acellular pertussis (Tdap/Td) vaccine.** / 1 dose of Td every 10 years.  Zoster vaccine.** / 1 dose for adults aged 25 years or older.  Pneumococcal 13-valent conjugate (PCV13) vaccine.** / Consult your health care provider.  Pneumococcal polysaccharide (PPSV23) vaccine.** / 1 dose for all adults aged 25 years and older.

## 2014-07-15 LAB — BASIC METABOLIC PANEL WITH GFR
BUN: 17 mg/dL (ref 6–23)
CO2: 38 mEq/L — ABNORMAL HIGH (ref 19–32)
Calcium: 9.1 mg/dL (ref 8.4–10.5)
Chloride: 96 mEq/L (ref 96–112)
Creat: 0.7 mg/dL (ref 0.50–1.10)
GFR, EST NON AFRICAN AMERICAN: 89 mL/min
GFR, Est African American: >89 mL/min
Glucose, Bld: 112 mg/dL — ABNORMAL HIGH (ref 70–99)
Potassium: 4.3 mEq/L (ref 3.5–5.3)
SODIUM: 140 meq/L (ref 135–145)

## 2014-07-15 LAB — LIPID PANEL
CHOLESTEROL: 158 mg/dL (ref 0–200)
HDL: 54 mg/dL (ref >39)
LDL Cholesterol: 68 mg/dL (ref 0–99)
TRIGLYCERIDES: 180 mg/dL — AB (ref <150)
Total CHOL/HDL Ratio: 2.9 Ratio
VLDL: 36 mg/dL (ref 0–40)

## 2014-07-15 LAB — URINALYSIS, MICROSCOPIC ONLY
Casts: NONE SEEN
Crystals: NONE SEEN

## 2014-07-15 LAB — HEMOGLOBIN A1C
Hgb A1c MFr Bld: 6.5 % — ABNORMAL HIGH (ref <5.7)
Mean Plasma Glucose: 140 mg/dL — ABNORMAL HIGH (ref <117)

## 2014-07-15 LAB — MICROALBUMIN / CREATININE URINE RATIO
CREATININE, URINE: 43.2 mg/dL
MICROALB/CREAT RATIO: 18.5 mg/g (ref 0.0–30.0)
Microalb, Ur: 0.8 mg/dL (ref <2.0)

## 2014-07-15 LAB — HEPATIC FUNCTION PANEL
ALT: 8 U/L (ref 0–35)
AST: 11 U/L (ref 0–37)
Albumin: 3.9 g/dL (ref 3.5–5.2)
Alkaline Phosphatase: 74 U/L (ref 39–117)
BILIRUBIN DIRECT: 0.1 mg/dL (ref 0.0–0.3)
BILIRUBIN INDIRECT: 0.2 mg/dL (ref 0.2–1.2)
TOTAL PROTEIN: 6.4 g/dL (ref 6.0–8.3)
Total Bilirubin: 0.3 mg/dL (ref 0.2–1.2)

## 2014-07-15 LAB — MAGNESIUM: Magnesium: 1.9 mg/dL (ref 1.5–2.5)

## 2014-07-15 LAB — INSULIN, FASTING: INSULIN FASTING, SERUM: 10.9 u[IU]/mL (ref 2.0–19.6)

## 2014-07-15 LAB — VITAMIN D 25 HYDROXY (VIT D DEFICIENCY, FRACTURES): Vit D, 25-Hydroxy: 71 ng/mL (ref 30–89)

## 2014-07-15 LAB — TSH: TSH: 9.96 u[IU]/mL — ABNORMAL HIGH (ref 0.350–4.500)

## 2014-07-19 ENCOUNTER — Other Ambulatory Visit: Payer: Self-pay | Admitting: Internal Medicine

## 2014-07-19 MED ORDER — LOSARTAN POTASSIUM 100 MG PO TABS: 100.0000 mg | ORAL_TABLET | Freq: Every day | ORAL | Status: DC

## 2014-08-02 ENCOUNTER — Other Ambulatory Visit: Payer: Self-pay

## 2014-08-02 DIAGNOSIS — K2100 Gastro-esophageal reflux disease with esophagitis, without bleeding: Secondary | ICD-10-CM

## 2014-08-02 MED ORDER — PRAVASTATIN SODIUM 40 MG PO TABS: 40.0000 mg | ORAL_TABLET | Freq: Every day | ORAL | Status: DC

## 2014-08-02 MED ORDER — METFORMIN HCL ER 500 MG PO TB24: 500.0000 mg | ORAL_TABLET | Freq: Three times a day (TID) | ORAL | Status: DC

## 2014-08-02 MED ORDER — FAMOTIDINE 20 MG PO TABS
20.0000 mg | ORAL_TABLET | Freq: Two times a day (BID) | ORAL | Status: DC
Start: 2014-08-02 — End: 2015-01-10

## 2014-08-02 MED ORDER — LEVOTHYROXINE SODIUM 200 MCG PO TABS: 200.0000 ug | ORAL_TABLET | Freq: Every day | ORAL | Status: DC

## 2014-08-03 ENCOUNTER — Other Ambulatory Visit (INDEPENDENT_AMBULATORY_CARE_PROVIDER_SITE_OTHER): Payer: Medicare Other | Admitting: *Deleted

## 2014-08-03 DIAGNOSIS — Z1212 Encounter for screening for malignant neoplasm of rectum: Secondary | ICD-10-CM

## 2014-08-03 LAB — POC HEMOCCULT BLD/STL (HOME/3-CARD/SCREEN)
Card #2 Fecal Occult Blod, POC: NEGATIVE
Card #3 Fecal Occult Blood, POC: NEGATIVE
FECAL OCCULT BLD: NEGATIVE

## 2014-08-04 ENCOUNTER — Emergency Department (HOSPITAL_COMMUNITY)
Admission: EM | Admit: 2014-08-04 | Discharge: 2014-08-04 | Disposition: A | Discharge status: Home / Self Care | Payer: Medicare Other | Attending: Emergency Medicine | Admitting: Emergency Medicine

## 2014-08-04 ENCOUNTER — Emergency Department (HOSPITAL_COMMUNITY): Payer: Medicare Other

## 2014-08-04 ENCOUNTER — Encounter (HOSPITAL_COMMUNITY): Payer: Self-pay | Admitting: Emergency Medicine

## 2014-08-04 DIAGNOSIS — J449 Chronic obstructive pulmonary disease, unspecified: Secondary | ICD-10-CM | POA: Insufficient documentation

## 2014-08-04 DIAGNOSIS — E785 Hyperlipidemia, unspecified: Secondary | ICD-10-CM | POA: Insufficient documentation

## 2014-08-04 DIAGNOSIS — E119 Type 2 diabetes mellitus without complications: Secondary | ICD-10-CM | POA: Insufficient documentation

## 2014-08-04 DIAGNOSIS — I471 Supraventricular tachycardia, unspecified: Secondary | ICD-10-CM | POA: Diagnosis present

## 2014-08-04 DIAGNOSIS — E669 Obesity, unspecified: Secondary | ICD-10-CM | POA: Diagnosis not present

## 2014-08-04 DIAGNOSIS — Z7982 Long term (current) use of aspirin: Secondary | ICD-10-CM | POA: Insufficient documentation

## 2014-08-04 DIAGNOSIS — R079 Chest pain, unspecified: Secondary | ICD-10-CM

## 2014-08-04 DIAGNOSIS — Z79899 Other long term (current) drug therapy: Secondary | ICD-10-CM | POA: Insufficient documentation

## 2014-08-04 DIAGNOSIS — F329 Major depressive disorder, single episode, unspecified: Secondary | ICD-10-CM | POA: Diagnosis not present

## 2014-08-04 DIAGNOSIS — Z720 Tobacco use: Secondary | ICD-10-CM | POA: Diagnosis not present

## 2014-08-04 DIAGNOSIS — I10 Essential (primary) hypertension: Secondary | ICD-10-CM | POA: Diagnosis present

## 2014-08-04 DIAGNOSIS — K219 Gastro-esophageal reflux disease without esophagitis: Secondary | ICD-10-CM | POA: Diagnosis not present

## 2014-08-04 DIAGNOSIS — M069 Rheumatoid arthritis, unspecified: Secondary | ICD-10-CM | POA: Diagnosis not present

## 2014-08-04 DIAGNOSIS — R0602 Shortness of breath: Secondary | ICD-10-CM | POA: Diagnosis present

## 2014-08-04 HISTORY — DX: Type 2 diabetes mellitus with other specified complication: E11.69

## 2014-08-04 HISTORY — DX: Type 2 diabetes mellitus with other specified complication: E66.9

## 2014-08-04 HISTORY — DX: Morbid (severe) obesity due to excess calories: E66.01

## 2014-08-04 LAB — BASIC METABOLIC PANEL
ANION GAP: 12 (ref 5–15)
BUN: 19 mg/dL (ref 6–23)
CALCIUM: 9.2 mg/dL (ref 8.4–10.5)
CO2: 31 mEq/L (ref 19–32)
Chloride: 102 mEq/L (ref 96–112)
Creatinine, Ser: 0.92 mg/dL (ref 0.50–1.10)
GFR calc Af Amer: 72 mL/min — ABNORMAL LOW (ref >90)
GFR calc non Af Amer: 63 mL/min — ABNORMAL LOW (ref >90)
GLUCOSE: 124 mg/dL — AB (ref 70–99)
Potassium: 4 mEq/L (ref 3.7–5.3)
Sodium: 145 mEq/L (ref 137–147)

## 2014-08-04 LAB — I-STAT TROPONIN, ED
Troponin i, poc: 0.02 ng/mL (ref 0.00–0.08)
Troponin i, poc: 0.08 ng/mL (ref 0.00–0.08)

## 2014-08-04 LAB — CBC
HCT: 43.2 % (ref 36.0–46.0)
Hemoglobin: 12.8 g/dL (ref 12.0–15.0)
MCH: 26.2 pg (ref 26.0–34.0)
MCHC: 29.6 g/dL — ABNORMAL LOW (ref 30.0–36.0)
MCV: 88.3 fL (ref 78.0–100.0)
PLATELETS: 210 10*3/uL (ref 150–400)
RBC: 4.89 MIL/uL (ref 3.87–5.11)
RDW: 16 % — ABNORMAL HIGH (ref 11.5–15.5)
WBC: 8.2 10*3/uL (ref 4.0–10.5)

## 2014-08-04 LAB — MAGNESIUM: Magnesium: 1.7 mg/dL (ref 1.5–2.5)

## 2014-08-04 MED ORDER — ASPIRIN 81 MG PO CHEW
324.0000 mg | CHEWABLE_TABLET | Freq: Once | ORAL | Status: AC
  Administered 2014-08-04: 324 mg via ORAL
  Filled 2014-08-04: qty 4

## 2014-08-04 MED ORDER — DILTIAZEM HCL 60 MG PO TABS: 60.0000 mg | ORAL_TABLET | Freq: Two times a day (BID) | ORAL | Status: DC

## 2014-08-04 MED ORDER — LOSARTAN POTASSIUM 100 MG PO TABS: 100.0000 mg | ORAL_TABLET | Freq: Every day | ORAL | Status: DC

## 2014-08-04 MED ORDER — LOSARTAN POTASSIUM 50 MG PO TABS: 50.0000 mg | ORAL_TABLET | Freq: Every day | ORAL | Status: DC

## 2014-08-04 NOTE — Discharge Instructions (Signed)
Decrease your losartan (Cozaar) dose from 100 mg to 50 mg by mouth once daily.  Start taking the diltiazem twice daily.  If you have symptoms again that do not stop with cold water or deep breaths call 911 and get re-evaluated immediately.   Supraventricular Tachycardia Supraventricular tachycardia (SVT) is when the heart beats very fast. SVT can last for a long time (sustained) or it can start and stop suddenly (nonsustained). HOME CARE   Take your heart medicine as told by your doctor. Check with your doctor before taking cold, diet, or herbal medicine.  Do not smoke.  Do not drink large amounts of caffeine.Caffeine is found in coffee, tea, soda (pop, cola), and chocolate.  Keep all doctor visits as told. GET HELP RIGHT AWAY IF:   You have chest pain or pressure.  You cannot catch your breath.  You are dizzy or lightheaded.  You feel like you will pass out (faint).  You are sweaty (diaphoretic) and feel sick to your stomach (nauseous) or throw up (vomit).  If you have the above problems, call your local emergency services (911 in [place] right away. Do not drive yourself to the hospital. MAKE SURE YOU:   Understand these instructions.  Will watch your condition.  Will get help right away if you are not doing well or get worse. Document Released: 09/02/2005 Document Revised: 11/25/2011 Document Reviewed: 12/07/2008 [facility] Patient Information 2015 [place], [place]. This information is not intended to replace advice given to you by your health care provider. Make sure you discuss any questions you have with your health care provider.

## 2014-08-04 NOTE — ED Provider Notes (Signed)
CSN: [id number]     Arrival date & time 08/04/14  0801 History   First MD Initiated Contact with Patient 08/04/14 0805     Chief Complaint  Patient presents with  . Shortness of Breath     Patient is a 68 y.o. female presenting with shortness of breath. The history is provided by the patient.  Shortness of Breath  Ms. Mullan presents for evaluation of left arm heaviness. She woke at 5:30 this morning with pain and heaviness in her left arm in her anterior neck. She could not get the pain better at home so she called EMS and when EMS arrived she was found to be in SVT. Adenosine 6 mg followed by 12 mg was given with conversion to sinus rhythm. After conversion of her rhythm her arm pain went away. She had an episode of left arm pain yesterday as well and when she checked her pulse ox her heart rate was high 150s to 160s. Symptoms yesterday went away without any treatment. She denies any recent illness with fevers, change in cough, chest pain, leg swelling or pain.  Symptoms are moderate, intermittent, and currently resolved.    Past Medical History  Diagnosis Date  . Hypertension   . Hyperlipidemia   . Diabetes mellitus without complication   . Obesity   . Vitamin D deficiency   . COPD (chronic obstructive pulmonary disease)   . Arthritis   . GERD (gastroesophageal reflux disease)   . Depression   . Thyroid disease   . OAB (overactive bladder)    Past Surgical History  Procedure Laterality Date  . Carpal tunnel release      L 1992 R 1984  . Eye surgery Bilateral     cataract  . Pubovaginal sling    . Tonsillectomy and adenoidectomy     Family History  Problem Relation Age of Onset  . Alcohol abuse Father   . Heart disease Father    History  Substance Use Topics  . Smoking status: Current Every Day Smoker -- 1.00 packs/day for 44 years    Types: Cigarettes  . Smokeless tobacco: Never Used  . Alcohol Use: No   OB History    No data available     Review of Systems   Respiratory: Positive for shortness of breath.   All other systems reviewed and are negative.     Allergies  Ace inhibitors and Inderal  Home Medications   Prior to Admission medications   Medication Sig Start Date End Date Taking? Authorizing Provider  acetaZOLAMIDE (DIAMOX) 250 MG tablet Take 250 mg by mouth 2 (two) times daily. 12/21/13   Unk Pinto, MD  ALPRAZolam Duanne Moron) 0.5 MG tablet Take 0.5 mg by mouth at bedtime as needed for anxiety.    Historical Provider, MD  aspirin 81 MG tablet Take 81 mg by mouth daily.    Historical Provider, MD  buPROPion (WELLBUTRIN XL) 150 MG 24 hr tablet Take 1 to 2 tablets daily as directed for mood / depression 07/14/14 10/14/14  Unk Pinto, MD  Cholecalciferol (VITAMIN D PO) Take 10,000 Units by mouth daily.    Historical Provider, MD  DULoxetine (CYMBALTA) 60 MG capsule Take 60 mg by mouth daily.    Historical Provider, MD  famotidine (PEPCID) 20 MG tablet Take 1 tablet (20 mg total) by mouth 2 (two) times daily. For acid reflux 08/02/14   Unk Pinto, MD  furosemide (LASIX) 40 MG tablet Take 40 mg by mouth.    Historical  Provider, MD  guaiFENesin (MUCINEX) 600 MG 12 hr tablet Take 600 mg by mouth daily.    Historical Provider, MD  levothyroxine (SYNTHROID, LEVOTHROID) 200 MCG tablet Take 1 tablet (200 mcg total) by mouth daily before breakfast. 08/02/14   Unk Pinto, MD  loratadine-pseudoephedrine (CLARITIN-D 24-HOUR) 10-240 MG per 24 hr tablet Take 1 tablet by mouth as needed.     Historical Provider, MD  losartan (COZAAR) 100 MG tablet Take 1 tablet (100 mg total) by mouth daily. 07/19/14   Unk Pinto, MD  Magnesium Chloride (MAGNESIUM DR PO) Take by mouth daily.    Historical Provider, MD  metFORMIN (GLUCOPHAGE-XR) 500 MG 24 hr tablet Take 1 tablet (500 mg total) by mouth 4 (four) times daily - after meals and at bedtime. 08/02/14   Unk Pinto, MD  OXYGEN-HELIUM IN Inhale 3 L into the lungs.     Historical Provider, MD   pravastatin (PRAVACHOL) 40 MG tablet Take 1 tablet (40 mg total) by mouth daily. 08/02/14   Unk Pinto, MD  varenicline (CHANTIX CONTINUING MONTH PAK) 1 MG tablet Take 1/2 to 1 tablet 2 x day as directed for Smoking cessation 07/14/14   Unk Pinto, MD   SpO2 98% Physical Exam  Constitutional: She is oriented to person, place, and time. She appears well-developed and well-nourished.  Mild distress. Chronically ill-appearing  HENT:  Head: Normocephalic and atraumatic.  Cardiovascular: Normal rate and regular rhythm.   No murmur heard. Pulmonary/Chest: Effort normal. No respiratory distress.  Mildly tachypneic. Occasional rhonchi in the bases  Abdominal: Soft. There is no tenderness. There is no rebound and no guarding.  Musculoskeletal: She exhibits no edema or tenderness.  Chronic erythema and violaceous changes to bilateral feet  Neurological: She is alert and oriented to person, place, and time.  Skin: Skin is warm and dry.  Psychiatric: She has a normal mood and affect. Her behavior is normal.  Nursing note and vitals reviewed.   ED Course  Procedures (including critical care time) Labs Review Labs Reviewed  CBC - Abnormal; Notable for the following:    MCHC 29.6 (*)    RDW 16.0 (*)    All other components within normal limits  BASIC METABOLIC PANEL - Abnormal; Notable for the following:    Glucose, Bld 124 (*)    GFR calc non Af Amer 63 (*)    GFR calc Af Amer 72 (*)    All other components within normal limits  MAGNESIUM  I-STAT TROPOININ, ED  I-STAT TROPOININ, ED    Imaging Review Dg Chest Portable 1 View  08/04/2014   CLINICAL DATA:  Shortness of breath.  EXAM: PORTABLE CHEST - 1 VIEW  COMPARISON:  Feb 06, 2014.  FINDINGS: The heart size and mediastinal contours are within normal limits. No pneumothorax or pleural effusion is noted. Mild central pulmonary vascular congestion is noted with possible mild bilateral perihilar pulmonary edema. The visualized  skeletal structures are unremarkable.  IMPRESSION: Mild central pulmonary vascular congestion is noted with possible mild bilateral perihilar edema.   Electronically Signed   By: Sabino Dick M.D.   On: 08/04/2014 08:38     EKG Interpretation   Date/Time:  Thursday August 04 2014 08:10:52 EST Ventricular Rate:  92 PR Interval:  164 QRS Duration: 139 QT Interval:  377 QTC Calculation: 466 R Axis:   106 Text Interpretation:  Sinus rhythm RBBB and LPFB Borderline ST elevation,  lateral leads Confirmed by Hazle Coca [phone]) on 08/04/2014 8:28:10 AM  Patient asymptomatic on recheck in the emergency department. Discussed with cardiology, will evaluate patient in ED. MDM   Final diagnoses:  Paroxysmal SVT (supraventricular tachycardia)    Patient here with left arm pain associated with SVT which has since resolved. She had a stress test back in May 2015 that had stress test induced SVT with chest pain at that time. Discussed with Dr. Ellyn Hack, with Cardiology, who evaluated the patient in the emergency department. Plan to decrease losartan dose and start diltiazem with outpatient EP follow-up. Discussed with patient very close return precautions and home care. Patient with significant COPD on home oxygen, sats are 89-90%. Patient does not have respiratory distress in the emergency department and current clinical picture is not consistent with COPD exacerbation, CHF exacerbation, or PE.      Quintella Reichert, MD 08/04/14 1446

## 2014-08-04 NOTE — ED Notes (Signed)
Dr. Harding at the bedside. 

## 2014-08-04 NOTE — Consult Note (Signed)
CARDIOLOGY CONSULTATION NOTE  Patient ID: Maddyn Lieurance MRN: [id number], DOB: [birth year] Date of Encounter: 08/04/2014, 12:13 PM Primary Physician: Alesia Richards, MD Primary Cardiologist: Lorretta Harp., MD  Chief Complaint: CHEST PAIN, RAPID HR Reason for CONSULT: SVT. We were asked to evaluate the patient for recommendations on Rx of what sounds like recurrent SVT associated with CP & dyspnea.  HPI: She is a pleasant morbidly obese woman with O2 requiring COPD as well as HTN & HLD, Hypothyroidism who was seen by Dr. Gwenlyn Found in April 2015 to evaluate episodic CP - usually occuring @ rest lasting 17min to ~1 hr.   She was evaluated with an Echo & Dobutamine Myoview (noted below).  Echo with hyperdynamic LV Fxn & mild AS.  She had negative perfusion images on Myoview, but developed SVT with Dobutamine infusion that replicated her Sx.   She has bee doing relatively well since then with no true recurrence of CP except during a brief COPD exacerbation with a short rapid HR episodes.  She has otherwise not noted rest or exertional CP, but does have dyspnea with both.  ~mild Orthopnea, but no PND.  Trace edema with ruddy-red discoloration of bilateral LE (knees to feet) -- does not note claudication, but is not very active.   No syncope, near syncope or TIA/Amaurosis fugax.  Past Medical History  Diagnosis Date  . Hypertension   . Hyperlipidemia   . Diabetes mellitus type 2 in obese   . Morbid obesity   . Vitamin D deficiency   . COPD (chronic obstructive pulmonary disease)   . Arthritis   . GERD (gastroesophageal reflux disease)   . Depression   . Thyroid disease   . OAB (overactive bladder)     Surgical History:  Past Surgical History  Procedure Laterality Date  . Carpal tunnel release      L 1992 R 1984  . Eye surgery Bilateral     cataract  . Pubovaginal sling    . Tonsillectomy and adenoidectomy    . Transthoracic echocardiogram  01/2011    Hyperdynamic LV with EF  65-70%, Gr 1 DD, Mild Ao Stenosis - mean gradient ~19 mmHg  . Nm myoview ltd  01/2011    Dobutamine Myoview: Negative perfusion scan for ischemia / infarct (poor image capture); Pt developed SVT with Dobutamine that reproduced her CP as it resolved with restoration of NSR.     Home Meds: Prior to Admission medications   Medication Sig Start Date End Date Taking? Authorizing Provider  acetaZOLAMIDE (DIAMOX) 250 MG tablet Take 250 mg by mouth 2 (two) times daily. 12/21/13  Yes Unk Pinto, MD  ALPRAZolam Duanne Moron) 0.5 MG tablet Take 0.5 mg by mouth at bedtime as needed for anxiety.   Yes Historical Provider, MD  aspirin 81 MG tablet Take 81 mg by mouth daily.   Yes Historical Provider, MD  buPROPion (WELLBUTRIN XL) 150 MG 24 hr tablet Take 1 to 2 tablets daily as directed for mood / depression 07/14/14 10/14/14 Yes Unk Pinto, MD  Cholecalciferol (VITAMIN D PO) Take 10,000 Units by mouth daily.   Yes Historical Provider, MD  famotidine (PEPCID) 20 MG tablet Take 1 tablet (20 mg total) by mouth 2 (two) times daily. For acid reflux 08/02/14  Yes Unk Pinto, MD  furosemide (LASIX) 40 MG tablet Take 40 mg by mouth.   Yes Historical Provider, MD  guaiFENesin (MUCINEX) 600 MG 12 hr tablet Take 600 mg by mouth daily.   Yes Historical Provider, MD  levothyroxine (SYNTHROID, LEVOTHROID) 200 MCG tablet Take 1 tablet (200 mcg total) by mouth daily before breakfast. 08/02/14  Yes Unk Pinto, MD  loratadine-pseudoephedrine (CLARITIN-D 24-HOUR) 10-240 MG per 24 hr tablet Take 1 tablet by mouth as needed.    Yes Historical Provider, MD  losartan (COZAAR) 100 MG tablet Take 1 tablet (100 mg total) by mouth daily. 07/19/14  Yes Unk Pinto, MD  Magnesium Chloride (MAGNESIUM DR PO) Take by mouth daily.   Yes Historical Provider, MD  metFORMIN (GLUCOPHAGE-XR) 500 MG 24 hr tablet Take 1 tablet (500 mg total) by mouth 4 (four) times daily - after meals and at bedtime. 08/02/14  Yes Unk Pinto, MD    OXYGEN-HELIUM IN Inhale 3 L into the lungs.    Yes Historical Provider, MD  pravastatin (PRAVACHOL) 40 MG tablet Take 1 tablet (40 mg total) by mouth daily. 08/02/14  Yes Unk Pinto, MD  varenicline (CHANTIX CONTINUING MONTH PAK) 1 MG tablet Take 1/2 to 1 tablet 2 x day as directed for Smoking cessation 07/14/14  Yes Unk Pinto, MD   Allergies:  Allergies  Allergen Reactions  . Ace Inhibitors     cough  . Inderal [Propranolol]     Hair loss    History   Social History  . Marital Status: Married    Spouse Name: N/A    Number of Children: N/A  . Years of Education: N/A   Occupational History  . Not on file.   Social History Main Topics  . Smoking status: Current Every Day Smoker -- 1.00 packs/day for 44 years    Types: Cigarettes  . Smokeless tobacco: Never Used  . Alcohol Use: No  . Drug Use: No  . Sexual Activity: Not on file   Other Topics Concern  . Not on file   Social History Narrative     Family History  Problem Relation Age of Onset  . Alcohol abuse Father   . Heart disease Father    Review of Systems: Comprehensive ROS was performed. Review of Systems  Constitutional: Negative for fever, chills and weight loss.  HENT: Positive for congestion. Negative for nosebleeds.   Respiratory: Positive for cough, shortness of breath and wheezing. Negative for hemoptysis and sputum production.        Chronic cough  Cardiovascular: Positive for chest pain, palpitations and leg swelling. Negative for claudication and PND.  Gastrointestinal: Positive for heartburn. Negative for constipation, blood in stool and melena.  Musculoskeletal: Positive for joint pain. Negative for falls.  Skin:       Bilateral feet red and raw  Neurological: Positive for dizziness and weakness. Negative for sensory change, speech change, focal weakness, seizures and loss of consciousness.  Endo/Heme/Allergies: Negative.  Does not bruise/bleed easily.  Psychiatric/Behavioral:  Negative for depression and memory loss. The patient is not nervous/anxious.   All other systems reviewed and are negative.  Labs:   Lab Results  Component Value Date   WBC 8.2 08/04/2014   HGB 12.8 08/04/2014   HCT 43.2 08/04/2014   MCV 88.3 08/04/2014   PLT 210 08/04/2014    Recent Labs Lab 08/04/14 0840  NA 145  K 4.0  CL 102  CO2 31  BUN 19  CREATININE 0.92  CALCIUM 9.2  GLUCOSE 124*   No results for input(s): CKTOTAL, CKMB, TROPONINI in the last 72 hours. Lab Results  Component Value Date   CHOL 158 07/14/2014   HDL 54 07/14/2014   LDLCALC 68 07/14/2014   TRIG 180*  07/14/2014   No results found for: DDIMER  Radiology/Studies:  Dg Chest Portable 1 View  08/04/2014   CLINICAL DATA:  Shortness of breath.  EXAM: PORTABLE CHEST - 1 VIEW  COMPARISON:  Feb 06, 2014.  FINDINGS: The heart size and mediastinal contours are within normal limits. No pneumothorax or pleural effusion is noted. Mild central pulmonary vascular congestion is noted with possible mild bilateral perihilar pulmonary edema. The visualized skeletal structures are unremarkable.  IMPRESSION: Mild central pulmonary vascular congestion is noted with possible mild bilateral perihilar edema.   Electronically Signed   By: Sabino Dick M.D.   On: 08/04/2014 08:38   TELE: Initial telemetry shows SVT rate of roughly 170-180 with right bundle branch block; result telemetry strip following the second dose of adenosine 12 mg which shows slight pause and then restoration of sinus rhythm with right bundle branch block that is somewhat narrower complex. This coincided with resolution of her pain.  Physical Exam: Blood pressure 120/45, pulse 80, resp. rate 19, SpO2 90 %. Physical Exam  Constitutional: She is oriented to person, place, and time. No distress.  Morbidly obese, somewhat unkempt  HENT:  Head: Normocephalic and atraumatic.  Mouth/Throat: Oropharynx is clear and moist. No oropharyngeal exudate.  Eyes:  Conjunctivae and EOM are normal. Pupils are equal, round, and reactive to light. No scleral icterus.  Neck: Normal range of motion. Neck supple. No JVD present. No tracheal deviation present.  Cardiovascular: Normal rate, regular rhythm, S1 normal and S2 normal.   Occasional extrasystoles are present. Exam reveals gallop, S4 and distant heart sounds. Exam reveals no S3.   Murmur heard.  Harsh midsystolic murmur is present with a grade of 2/6  at the upper right sternal border radiating to the neck Pulmonary/Chest: She has wheezes. She has no rales. She exhibits no tenderness.  Abdominal: Soft. Bowel sounds are normal. She exhibits no distension and no mass. There is no tenderness. There is no rebound and no guarding.  Morbidly obese   Musculoskeletal: She exhibits edema.  Trace edema  Neurological: She is alert and oriented to person, place, and time. No cranial nerve deficit.  Skin: Skin is warm and dry. She is not diaphoretic. There is erythema.  Both feet red & raw, dry with skin cracking  Psychiatric: She has a normal mood and affect. Her behavior is normal. Judgment and thought content normal.     ASSESSMENT AND PLAN:  Principal Problem:   Paroxysmal SVT (supraventricular tachycardia) Active Problems:   COPD bronchitis   Essential hypertension   Chest pain at rest - associated with SVT; Negative Dobutamine Myoview, but did have SVT  She clearly has symptomatic SVT - with CP resolving as the tachycardia broke.  She has had a recent ST that was negative which makes me relatively certain that she does not have macrovascular CAD. She may very well microvascular disease and is intolerant of the tachycardia, however she could also just simply be hypoxic with her bad COPD.  She is ruled out for MI, and is currently comfortable with normal rate. I discussed her with Dr. [name] all read from Electrophysiology, who believes that we should start the patient on a calcium channel blocker (no beta  blocker due to COPD) and have her set up to see Electrophysiology in outpatient consultation to consider the possibility of ablation.  I spent close to 30 minutes discussing the etiology of her condition and thought process of having treated. We also discussed potential vagal maneuvers to abort  an episode including: Valsalva, coughing and drinking cold water.   With a recent relatively normal cardiac evaluation for ischemic standpoint, I think she is probably fine to be discharged if her next set of cardiac enzymes remain negative. She can be seen in the EP clinic. That she'll be contacted by the clinic staff set up as scheduled appointment.  I would recommend starting her on diltiazem 60 mg twice a day. To avoid hypotension, I would reduce her ARB dose in half.  Plan was discussed with the ER physician.  Thank you   Signed, Leonie Man, M.D., M.S. Interventional Cardiologist   Pager # [phone]  08/04/2014, 12:13 PM

## 2014-08-04 NOTE — ED Notes (Signed)
Per EMS pt c/o of L arm heaviness and checked HR and it was 150-170. Pt HR on arrival 180s. Pt received 6 adenosine without conversion and then 12 adenosine with conversion. Pt has hx of COPD, DM, HTN, Thyroid. Pt in NS. Pt started chantix and welbutrin 2 weeks ago. EMS vitals 98%O2 15L NRB, BP 122/72, 98HR. Pt sts her L arm was heavy yesterday but only lasted about an hour.

## 2014-08-09 ENCOUNTER — Other Ambulatory Visit: Payer: Self-pay | Admitting: Internal Medicine

## 2014-08-09 DIAGNOSIS — G4700 Insomnia, unspecified: Secondary | ICD-10-CM

## 2014-08-09 MED ORDER — ALPRAZOLAM 0.5 MG PO TABS: 0.5000 mg | ORAL_TABLET | Freq: Every evening | ORAL | Status: DC | PRN

## 2014-08-16 ENCOUNTER — Encounter: Payer: Self-pay | Admitting: Internal Medicine

## 2014-08-16 ENCOUNTER — Ambulatory Visit (INDEPENDENT_AMBULATORY_CARE_PROVIDER_SITE_OTHER): Payer: Medicare Other

## 2014-08-16 ENCOUNTER — Ambulatory Visit (HOSPITAL_COMMUNITY)
Admission: RE | Admit: 2014-08-16 | Discharge: 2014-08-16 | Disposition: A | Discharge status: Home / Self Care | Payer: Medicare Other | Source: Ambulatory Visit | Attending: Internal Medicine | Admitting: Internal Medicine

## 2014-08-16 ENCOUNTER — Ambulatory Visit: Payer: Medicare Other | Admitting: Cardiovascular Disease

## 2014-08-16 ENCOUNTER — Ambulatory Visit (INDEPENDENT_AMBULATORY_CARE_PROVIDER_SITE_OTHER): Payer: Medicare Other | Admitting: Internal Medicine

## 2014-08-16 VITALS — BP 122/70 | HR 75 | Ht 65.5 in

## 2014-08-16 DIAGNOSIS — Z780 Asymptomatic menopausal state: Secondary | ICD-10-CM

## 2014-08-16 DIAGNOSIS — I471 Supraventricular tachycardia: Secondary | ICD-10-CM

## 2014-08-16 DIAGNOSIS — J439 Emphysema, unspecified: Secondary | ICD-10-CM

## 2014-08-16 DIAGNOSIS — J449 Chronic obstructive pulmonary disease, unspecified: Secondary | ICD-10-CM

## 2014-08-16 DIAGNOSIS — IMO0001 Reserved for inherently not codable concepts without codable children: Secondary | ICD-10-CM

## 2014-08-16 DIAGNOSIS — I10 Essential (primary) hypertension: Secondary | ICD-10-CM

## 2014-08-16 DIAGNOSIS — Z23 Encounter for immunization: Secondary | ICD-10-CM

## 2014-08-16 NOTE — Patient Instructions (Signed)
Your physician recommends that you schedule a follow-up appointment in: 4 months with Dr Allred  

## 2014-08-16 NOTE — Progress Notes (Signed)
Patient ID: Jillian Wood, female   DOB: 03-05-1946, 68 y.o.   MRN: [id number] Patient presents for high dose flu vaccine.

## 2014-08-17 NOTE — Progress Notes (Signed)
HPI Jillian Wood is referred today by Dr. Gwenlyn Found for evaluation of SVT. She is a pleasant chronically ill woman with oxygen dependent COPD for 5 years, and HTN who has subsequently developed SVT with 2 episodes requiring IV adenosine. She has been placed on cardizem in the interim and her symptoms have been stable. She experiences chest pressure and worsening sob with her episodes which start and stop suddenly. She has not had syncope but does get a little lightheaded when she has her SVT. The episodes start and stop suddenly.  Allergies  Allergen Reactions  . Ace Inhibitors     cough  . Inderal [Propranolol]     Hair loss     Current Outpatient Prescriptions  Medication Sig Dispense Refill  . acetaZOLAMIDE (DIAMOX) 250 MG tablet Take 250 mg by mouth 2-3 times a day    . ALPRAZolam (XANAX) 0.5 MG tablet Take 1 tablet (0.5 mg total) by mouth at bedtime as needed for anxiety. 90 tablet 1  . aspirin 81 MG tablet Take 81 mg by mouth daily.    Marland Kitchen buPROPion (WELLBUTRIN XL) 150 MG 24 hr tablet Take 1 to 2 tablets daily as directed for mood / depression 30 tablet 2  . Cholecalciferol (VITAMIN D PO) Take 10,000 Units by mouth daily.    Marland Kitchen diltiazem (CARDIZEM) 60 MG tablet Take 1 tablet (60 mg total) by mouth 2 (two) times daily. 30 tablet 0  . famotidine (PEPCID) 20 MG tablet Take 1 tablet (20 mg total) by mouth 2 (two) times daily. For acid reflux 180 tablet PRN  . furosemide (LASIX) 40 MG tablet Take 20 mg by mouth daily.     Marland Kitchen guaiFENesin (MUCINEX) 600 MG 12 hr tablet Take 600 mg by mouth daily.    Marland Kitchen levothyroxine (SYNTHROID, LEVOTHROID) 200 MCG tablet Take 1 tablet (200 mcg total) by mouth daily before breakfast. 90 tablet 3  . loratadine-pseudoephedrine (CLARITIN-D 24-HOUR) 10-240 MG per 24 hr tablet Take 1 tablet by mouth as needed.     Marland Kitchen losartan (COZAAR) 50 MG tablet Take 1 tablet (50 mg total) by mouth daily. 30 tablet 0  . Magnesium Chloride (MAGNESIUM DR PO) Take 1 capsule by mouth  daily.     . metFORMIN (GLUCOPHAGE-XR) 500 MG 24 hr tablet Take 1 tablet (500 mg total) by mouth 4 (four) times daily - after meals and at bedtime. 360 tablet 3  . OXYGEN-HELIUM IN Inhale 3 L into the lungs.     . pravastatin (PRAVACHOL) 40 MG tablet Take 1 tablet (40 mg total) by mouth daily. 90 tablet 3  . varenicline (CHANTIX CONTINUING MONTH PAK) 1 MG tablet Take 1/2 to 1 tablet 2 x day as directed for Smoking cessation 56 tablet 2   No current facility-administered medications for this visit.     Past Medical History  Diagnosis Date  . Hypertension   . Hyperlipidemia   . Diabetes mellitus type 2 in obese   . Morbid obesity   . Vitamin D deficiency   . COPD (chronic obstructive pulmonary disease)   . Arthritis   . GERD (gastroesophageal reflux disease)   . Depression   . Thyroid disease   . OAB (overactive bladder)     ROS:   All systems reviewed and negative except as noted in the HPI.   Past Surgical History  Procedure Laterality Date  . Carpal tunnel release      L 1992 R 1984  . Eye surgery Bilateral  cataract  . Pubovaginal sling    . Tonsillectomy and adenoidectomy    . Transthoracic echocardiogram  01/2011    Hyperdynamic LV with EF 65-70%, Gr 1 DD, Mild Ao Stenosis - mean gradient ~19 mmHg  . Nm myoview ltd  01/2011    Dobutamine Myoview: Negative perfusion scan for ischemia / infarct (poor image capture); Pt developed SVT with Dobutamine that reproduced her CP as it resolved with restoration of NSR.     Family History  Problem Relation Age of Onset  . Alcohol abuse Father   . Heart disease Father      History   Social History  . Marital Status: Married    Spouse Name: N/A    Number of Children: N/A  . Years of Education: N/A   Occupational History  . Not on file.   Social History Main Topics  . Smoking status: Current Every Day Smoker -- 1.00 packs/day for 44 years    Types: Cigarettes  . Smokeless tobacco: Never Used  . Alcohol Use: No   . Drug Use: No  . Sexual Activity: Not on file   Other Topics Concern  . Not on file   Social History Narrative     BP 122/70 mmHg  Pulse 75  Ht 5' 5.5" (1.664 m)  SpO2 88%  Physical Exam:  Chronically ill appearing 68 yo woman, looking older than her stated age, wearing oxygen, NAD HEENT: Unremarkable Neck:  No JVD, no thyromegally Back:  No CVA tenderness Lungs:  Scattered wheezes and decreased breath sounds. No rales or rhonchi. HEART:  Regular rate rhythm, no murmurs, no rubs, no clicks Abd:  soft, positive bowel sounds, no organomegally, no rebound, no guarding Ext:  2 plus pulses, no edema, no cyanosis, no clubbing Skin:  No rashes no nodules Neuro:  CN II through XII intact, motor grossly intact  EKG - nsr  Assess/Plan:

## 2014-08-17 NOTE — Assessment & Plan Note (Signed)
Her blood pressure is well controlled. No change in meds. She is encouraged to maintain a low sodium diet.

## 2014-08-17 NOTE — Assessment & Plan Note (Signed)
The patient has had a couple of episodes of SVT but has had none since starting her calcium channel blocker. She is very symptomatic as she has very poor lung disease. She has not yet had an episode of SVT on cardizem. She is not a great candidate for ablation but is she has recurrent symptoms on meidcal therapy, I would recommend catheter ablation. I will see her back in several months.

## 2014-08-17 NOTE — Assessment & Plan Note (Signed)
She will continue her current bronchodilators and home oxygen therapy. She is still smoking! But is trying to stop. I have encouraged her to do so.

## 2014-08-18 ENCOUNTER — Other Ambulatory Visit: Payer: Self-pay | Admitting: *Deleted

## 2014-08-18 MED ORDER — FUROSEMIDE 80 MG PO TABS: ORAL_TABLET | ORAL | Status: DC

## 2014-08-30 ENCOUNTER — Telehealth: Payer: Self-pay | Admitting: *Deleted

## 2014-08-30 MED ORDER — DIAZEPAM 5 MG PO TABS: ORAL_TABLET | ORAL | Status: DC

## 2014-08-30 NOTE — Telephone Encounter (Signed)
Prior auth was sent in and declined by [facility] for alprazolam.  Insurance informed office, patient must try and fail either Clorazepate, Diazepam or Lorazepam.  OK to send in RX for Diazepam per Dr Melford Aase.

## 2014-09-07 ENCOUNTER — Other Ambulatory Visit: Payer: Self-pay | Admitting: *Deleted

## 2014-09-07 MED ORDER — LOSARTAN POTASSIUM 50 MG PO TABS
50.0000 mg | ORAL_TABLET | Freq: Every day | ORAL | Status: DC
Start: 2014-09-07 — End: 2014-09-07

## 2014-09-07 MED ORDER — DILTIAZEM HCL 60 MG PO TABS: 60.0000 mg | ORAL_TABLET | Freq: Two times a day (BID) | ORAL | Status: DC

## 2014-09-07 MED ORDER — LOSARTAN POTASSIUM 50 MG PO TABS: 50.0000 mg | ORAL_TABLET | Freq: Every day | ORAL | Status: DC

## 2014-09-21 ENCOUNTER — Ambulatory Visit (INDEPENDENT_AMBULATORY_CARE_PROVIDER_SITE_OTHER): Payer: PPO | Admitting: Physician Assistant

## 2014-09-21 ENCOUNTER — Encounter: Payer: Self-pay | Admitting: Physician Assistant

## 2014-09-21 VITALS — BP 124/82 | HR 68 | Temp 98.0°F | Resp 18 | Ht 64.25 in | Wt 262.0 lb

## 2014-09-21 DIAGNOSIS — IMO0001 Reserved for inherently not codable concepts without codable children: Secondary | ICD-10-CM

## 2014-09-21 DIAGNOSIS — Z9981 Dependence on supplemental oxygen: Secondary | ICD-10-CM

## 2014-09-21 DIAGNOSIS — J209 Acute bronchitis, unspecified: Secondary | ICD-10-CM

## 2014-09-21 DIAGNOSIS — M25571 Pain in right ankle and joints of right foot: Secondary | ICD-10-CM

## 2014-09-21 DIAGNOSIS — R0602 Shortness of breath: Secondary | ICD-10-CM

## 2014-09-21 DIAGNOSIS — J449 Chronic obstructive pulmonary disease, unspecified: Secondary | ICD-10-CM

## 2014-09-21 MED ORDER — AZITHROMYCIN 250 MG PO TABS: ORAL_TABLET | ORAL | Status: AC

## 2014-09-21 NOTE — Patient Instructions (Addendum)
- Take Z-Pak as prescribed. -Please go get chest x-ray at [facility] hospital. -Get right ankle x-ray at [facility] hospital   Please do oxygen at 3-4 liters at rest and 6 liters when active.  Please keep appt on 10/26/14.  Chronic Obstructive Pulmonary Disease Chronic obstructive pulmonary disease (COPD) is a common lung condition in which airflow from the lungs is limited. COPD is a general term that can be used to describe many different lung problems that limit airflow, including both chronic bronchitis and emphysema. If you have COPD, your lung function will probably never return to normal, but there are measures you can take to improve lung function and make yourself feel better.  CAUSES   Smoking (common).   Exposure to secondhand smoke.   Genetic problems.  Chronic inflammatory lung diseases or recurrent infections. SYMPTOMS   Shortness of breath, especially with physical activity.   Deep, persistent (chronic) cough with a large amount of thick mucus.   Wheezing.   Rapid breaths (tachypnea).   Gray or bluish discoloration (cyanosis) of the skin, especially in fingers, toes, or lips.   Fatigue.   Weight loss.   Frequent infections or episodes when breathing symptoms become much worse (exacerbations).   Chest tightness. DIAGNOSIS  Your health care provider will take a medical history and perform a physical examination to make the initial diagnosis. Additional tests for COPD may include:   Lung (pulmonary) function tests.  Chest X-ray.  CT scan.  Blood tests. TREATMENT  Treatment available to help you feel better when you have COPD includes:   Inhaler and nebulizer medicines. These help manage the symptoms of COPD and make your breathing more comfortable.  Supplemental oxygen. Supplemental oxygen is only helpful if you have a low oxygen level in your blood.   Exercise and physical activity. These are beneficial for nearly all people with COPD. Some  people may also benefit from a pulmonary rehabilitation program. HOME CARE INSTRUCTIONS   Take all medicines (inhaled or pills) as directed by your health care provider.  Avoid over-the-counter medicines or cough syrups that dry up your airway (such as antihistamines) and slow down the elimination of secretions unless instructed otherwise by your health care provider.   If you are a smoker, the most important thing that you can do is stop smoking. Continuing to smoke will cause further lung damage and breathing trouble. Ask your health care provider for help with quitting smoking. He or she can direct you to community resources or hospitals that provide support.  Avoid exposure to irritants such as smoke, chemicals, and fumes that aggravate your breathing.  Use oxygen therapy and pulmonary rehabilitation if directed by your health care provider. If you require home oxygen therapy, ask your health care provider whether you should purchase a pulse oximeter to measure your oxygen level at home.   Avoid contact with individuals who have a contagious illness.  Avoid extreme temperature and humidity changes.  Eat healthy foods. Eating smaller, more frequent meals and resting before meals may help you maintain your strength.  Stay active, but balance activity with periods of rest. Exercise and physical activity will help you maintain your ability to do things you want to do.  Preventing infection and hospitalization is very important when you have COPD. Make sure to receive all the vaccines your health care provider recommends, especially the pneumococcal and influenza vaccines. Ask your health care provider whether you need a pneumonia vaccine.  Learn and use relaxation techniques to manage stress.  Learn and use controlled breathing techniques as directed by your health care provider. Controlled breathing techniques include:   Pursed lip breathing. Start by breathing in (inhaling) through  your nose for 1 second. Then, purse your lips as if you were going to whistle and breathe out (exhale) through the pursed lips for 2 seconds.   Diaphragmatic breathing. Start by putting one hand on your abdomen just above your waist. Inhale slowly through your nose. The hand on your abdomen should move out. Then purse your lips and exhale slowly. You should be able to feel the hand on your abdomen moving in as you exhale.   Learn and use controlled coughing to clear mucus from your lungs. Controlled coughing is a series of short, progressive coughs. The steps of controlled coughing are:  1. Lean your head slightly forward.  2. Breathe in deeply using diaphragmatic breathing.  3. Try to hold your breath for 3 seconds.  4. Keep your mouth slightly open while coughing twice.  5. Spit any mucus out into a tissue.  6. Rest and repeat the steps once or twice as needed. SEEK MEDICAL CARE IF:   You are coughing up more mucus than usual.   There is a change in the color or thickness of your mucus.   Your breathing is more labored than usual.   Your breathing is faster than usual.  SEEK IMMEDIATE MEDICAL CARE IF:   You have shortness of breath while you are resting.   You have shortness of breath that prevents you from:  Being able to talk.   Performing your usual physical activities.   You have chest pain lasting longer than 5 minutes.   Your skin color is more cyanotic than usual.  You measure low oxygen saturations for longer than 5 minutes with a pulse oximeter. MAKE SURE YOU:   Understand these instructions.  Will watch your condition.  Will get help right away if you are not doing well or get worse. Document Released: 06/12/2005 Document Revised: 01/17/2014 Document Reviewed: 04/29/2013 [facility] Patient Information 2015 [place], [place]. This information is not intended to replace advice given to you by your health care provider. Make sure you discuss any  questions you have with your health care provider.

## 2014-09-21 NOTE — Progress Notes (Signed)
Subjective:    Patient ID: Jillian Wood, female    DOB: [birth year], 69 y.o.   MRN: [id number]  HPI A Caucasian 69yo female presents to office today for oxygen evaluation.  Patient needs oxygen evaluation due to "[employer]" not being in network for insurance.  Patient needs orders for [place] for oxygen.  Patient is currently on oxygen for COPD for the past 5 years.  Patient is currently on 3 liters of oxygen during visit and her oxygen saturation at rest was 84%.  Patient's oxygen was then increased to 4 liters and her oxygen saturation at rest was still around 84%.  Patient's oxygen was then increased to 5 liters and her oxygen at rest was 89% and then after 5 minutes her oxygen was at 94%.  Patient has hard time walking from house to car with cane and gets SOB and fast breathing to catch her breath with walking short distances.  Patient states she usually has to increase oxygen to 6 liters with any activity.  Review of Systems  Constitutional: Negative.   HENT: Positive for congestion. Negative for ear discharge, ear pain, postnasal drip, rhinorrhea, sinus pressure, sore throat and trouble swallowing.   Eyes: Negative.   Respiratory: Positive for cough and shortness of breath. Negative for chest tightness and wheezing.        Non-productive  Cardiovascular: Negative.   Gastrointestinal: Negative.   Genitourinary: Negative.   Musculoskeletal: Negative.   Skin: Positive for color change. Negative for rash.       Patient states she has tender area on her right lateral malleolus.  Patient states it has been there for months and that she has been using antibiotic cream and notices an improvement with that.  Patient would like to try honey paste to see if that helps.    Neurological: Negative.   Psychiatric/Behavioral: Negative.    Past Medical History  Diagnosis Date  . Hypertension   . Hyperlipidemia   . Diabetes mellitus type 2 in obese   . Morbid obesity   . Vitamin D deficiency    . COPD (chronic obstructive pulmonary disease)   . Arthritis   . GERD (gastroesophageal reflux disease)   . Depression   . Thyroid disease   . OAB (overactive bladder)    Current Outpatient Prescriptions on File Prior to Visit  Medication Sig Dispense Refill  . acetaZOLAMIDE (DIAMOX) 250 MG tablet Take 250 mg by mouth 2-3 times a day    . aspirin 81 MG tablet Take 81 mg by mouth daily.    Marland Kitchen buPROPion (WELLBUTRIN XL) 150 MG 24 hr tablet Take 1 to 2 tablets daily as directed for mood / depression 30 tablet 2  . Cholecalciferol (VITAMIN D PO) Take 10,000 Units by mouth daily.    . diazepam (VALIUM) 5 MG tablet Take 1 tab 3 times a day for anxiety. 90 tablet 2  . diltiazem (CARDIZEM) 60 MG tablet Take 1 tablet (60 mg total) by mouth 2 (two) times daily. 60 tablet 0  . diltiazem (CARDIZEM) 60 MG tablet Take 1 tablet (60 mg total) by mouth 2 (two) times daily. 180 tablet 0  . famotidine (PEPCID) 20 MG tablet Take 1 tablet (20 mg total) by mouth 2 (two) times daily. For acid reflux 180 tablet PRN  . furosemide (LASIX) 80 MG tablet Take 1/2 tab daily for BP and fluid. 45 tablet 0  . guaiFENesin (MUCINEX) 600 MG 12 hr tablet Take 600 mg by mouth daily.    Marland Kitchen  levothyroxine (SYNTHROID, LEVOTHROID) 200 MCG tablet Take 1 tablet (200 mcg total) by mouth daily before breakfast. 90 tablet 3  . loratadine-pseudoephedrine (CLARITIN-D 24-HOUR) 10-240 MG per 24 hr tablet Take 1 tablet by mouth as needed.     Marland Kitchen losartan (COZAAR) 50 MG tablet Take 1 tablet (50 mg total) by mouth daily. 90 tablet 0  . Magnesium Chloride (MAGNESIUM DR PO) Take 1 capsule by mouth daily.     . metFORMIN (GLUCOPHAGE-XR) 500 MG 24 hr tablet Take 1 tablet (500 mg total) by mouth 4 (four) times daily - after meals and at bedtime. 360 tablet 3  . OXYGEN-HELIUM IN Inhale 3 L into the lungs.     . pravastatin (PRAVACHOL) 40 MG tablet Take 1 tablet (40 mg total) by mouth daily. 90 tablet 3  . varenicline (CHANTIX CONTINUING MONTH PAK) 1 MG  tablet Take 1/2 to 1 tablet 2 x day as directed for Smoking cessation 56 tablet 2   No current facility-administered medications on file prior to visit.   Allergies  Allergen Reactions  . Ace Inhibitors     cough  . Inderal [Propranolol]     Hair loss     BP 124/82 mmHg  Pulse 68  Temp(Src) 98 F (36.7 C) (Temporal)  Resp 18  Ht 5' 4.25" (1.632 m)  Wt 262 lb (118.842 kg)  BMI 44.62 kg/m2  SpO2 84% Wt Readings from Last 3 Encounters:  09/21/14 262 lb (118.842 kg)  07/14/14 262 lb 6.4 oz (119.024 kg)  02/22/14 264 lb 9.6 oz (120.022 kg)   Objective:   Physical Exam  Constitutional: She is oriented to person, place, and time. She appears well-developed and well-nourished. She does not have a sickly appearance. No distress.  Obese  HENT:  Head: Normocephalic.  Right Ear: Tympanic membrane, external ear and ear canal normal.  Left Ear: Tympanic membrane, external ear and ear canal normal.  Nose: Nose normal. Right sinus exhibits no maxillary sinus tenderness and no frontal sinus tenderness. Left sinus exhibits no maxillary sinus tenderness and no frontal sinus tenderness.  Mouth/Throat: Uvula is midline, oropharynx is clear and moist and mucous membranes are normal. Mucous membranes are not pale and not dry. No trismus in the jaw. No uvula swelling. No oropharyngeal exudate, posterior oropharyngeal edema or posterior oropharyngeal erythema.  Eyes: Conjunctivae, EOM and lids are normal. Pupils are equal, round, and reactive to light. Right eye exhibits no discharge. Left eye exhibits no discharge. No scleral icterus.  Neck: Trachea normal, normal range of motion and phonation normal. Neck supple. No tracheal tenderness present. No tracheal deviation present.  Cardiovascular: Normal rate, regular rhythm, S1 normal, S2 normal and normal pulses.  Exam reveals no gallop, no distant heart sounds and no friction rub.   Murmur heard.  Systolic murmur is present with a grade of 2/6    Holosystolic murmur heard best at 2nd right intercostal space. Decreased peripheral pulses.  Pulmonary/Chest: Effort normal. No accessory muscle usage or stridor. No respiratory distress. She has decreased breath sounds. She has no wheezes. She has no rhonchi. She has no rales. She exhibits no tenderness.  Abdominal: Soft. Bowel sounds are normal. There is no tenderness. There is no rebound and no guarding.  Musculoskeletal: She exhibits no edema.  Lymphadenopathy:  No tenderness or LAD.  Neurological: She is alert and oriented to person, place, and time. No cranial nerve deficit.  Patient was seen sitting in wheelchair.   Skin: Skin is warm, dry and intact. No  rash noted. She is not diaphoretic. There is erythema. No pallor.     Psychiatric: She has a normal mood and affect. Her speech is normal and behavior is normal. Judgment and thought content normal. Cognition and memory are normal.  Vitals reviewed.  Assessment & Plan:  1. COPD Bronchitis (Oxygen Dependent) and SOB (shortness of breath)  Ordered chest x-ray to R/O pneumonia. - DG Chest 2 View; Future Will send in note to [place] for oxygen supplies. Patient needs to be on 3-4 liters of oxygen at rest and 6 liters of oxygen during activities.  2. Acute bronchitis, unspecified organism -Take Z-Pak as prescribed- azithromycin (ZITHROMAX) 250 MG tablet; Take 2 tablets PO on day 1, then 1 tablet PO Q24H x 4 days  Dispense: 6 tablet; Refill: 0  3. Right ankle pain Ordered x-rays to R/O joint/bone infection. - DG Foot Complete Right; Future - DG Ankle Complete Right; Future  Discussed medication effects and SE's.  Pt agreed to treatment plan. Please keep your follow up appt on 10/26/14.  Marybel Alcott, Stephani Police, PA-C 5:07 PM [facility]

## 2014-09-23 ENCOUNTER — Ambulatory Visit (HOSPITAL_COMMUNITY)
Admission: RE | Admit: 2014-09-23 | Discharge: 2014-09-23 | Disposition: A | Discharge status: Home / Self Care | Payer: PPO | Source: Ambulatory Visit | Attending: Physician Assistant | Admitting: Physician Assistant

## 2014-09-23 DIAGNOSIS — F17210 Nicotine dependence, cigarettes, uncomplicated: Secondary | ICD-10-CM | POA: Diagnosis not present

## 2014-09-23 DIAGNOSIS — R05 Cough: Secondary | ICD-10-CM | POA: Diagnosis present

## 2014-09-23 DIAGNOSIS — R609 Edema, unspecified: Secondary | ICD-10-CM | POA: Diagnosis not present

## 2014-09-23 DIAGNOSIS — M25571 Pain in right ankle and joints of right foot: Secondary | ICD-10-CM | POA: Diagnosis not present

## 2014-09-23 DIAGNOSIS — J90 Pleural effusion, not elsewhere classified: Secondary | ICD-10-CM | POA: Diagnosis not present

## 2014-09-23 DIAGNOSIS — M79671 Pain in right foot: Secondary | ICD-10-CM | POA: Insufficient documentation

## 2014-09-23 DIAGNOSIS — R0602 Shortness of breath: Secondary | ICD-10-CM

## 2014-09-24 ENCOUNTER — Other Ambulatory Visit: Payer: Self-pay | Admitting: Physician Assistant

## 2014-09-24 DIAGNOSIS — J90 Pleural effusion, not elsewhere classified: Secondary | ICD-10-CM

## 2014-09-24 NOTE — Progress Notes (Signed)
Patient's chest x-ray on 09/23/14 showed "Trace interstitial edema and pleural effusions."  Patient is on Lasix 80mg - 1/2 tablet daily and acetazolamide 250mg  2-3 times a day.  Spoke with Dr. Melford Aase and he agrees that patient should increase lasix to 1 whole tablet BID and come in Monday for appt with labs (BMP, BNP, Troponin).  Dr. Melford Aase told me she does not get out of bed until well after lunch.   Vincenzo Stave, Stephani Police, PA-C 4:05 PM

## 2014-09-27 ENCOUNTER — Encounter: Payer: Self-pay | Admitting: Physician Assistant

## 2014-09-27 ENCOUNTER — Ambulatory Visit (INDEPENDENT_AMBULATORY_CARE_PROVIDER_SITE_OTHER): Payer: PPO | Admitting: Physician Assistant

## 2014-09-27 ENCOUNTER — Ambulatory Visit (HOSPITAL_COMMUNITY)
Admission: RE | Admit: 2014-09-27 | Discharge: 2014-09-27 | Disposition: A | Discharge status: Home / Self Care | Payer: PPO | Source: Ambulatory Visit | Attending: Physician Assistant | Admitting: Physician Assistant

## 2014-09-27 VITALS — BP 126/62 | HR 82 | Temp 98.6°F | Resp 18 | Ht 64.25 in | Wt 250.0 lb

## 2014-09-27 DIAGNOSIS — I517 Cardiomegaly: Secondary | ICD-10-CM

## 2014-09-27 DIAGNOSIS — J9811 Atelectasis: Secondary | ICD-10-CM | POA: Insufficient documentation

## 2014-09-27 DIAGNOSIS — J90 Pleural effusion, not elsewhere classified: Secondary | ICD-10-CM

## 2014-09-27 DIAGNOSIS — R938 Abnormal findings on diagnostic imaging of other specified body structures: Secondary | ICD-10-CM

## 2014-09-27 DIAGNOSIS — R9389 Abnormal findings on diagnostic imaging of other specified body structures: Secondary | ICD-10-CM

## 2014-09-27 DIAGNOSIS — M25571 Pain in right ankle and joints of right foot: Secondary | ICD-10-CM

## 2014-09-27 MED ORDER — DOXYCYCLINE HYCLATE 100 MG PO TABS: 100.0000 mg | ORAL_TABLET | Freq: Two times a day (BID) | ORAL | Status: AC

## 2014-09-27 NOTE — Patient Instructions (Addendum)
-Continue all medications as prescribed. -Do not take Lasix today.  I will message you on what to take for lasix after I get lab results. -Continue "No Salt" potassium for seasoning.   -I will message you on MyChart for lab results.   Please take doxycycline as prescribed for right ankle pain. Take with food.    Please keep your appt on 10/26/14.    Pleural Effusion The lining covering your lungs and the inside of your chest is called the pleura. Usually, the space between the two pleura contains no air and only a thin layer of fluid. A pleural effusion is an abnormal buildup of fluid in the pleural space. Fluid gathers when there is increased pressure in the lung vessels. This forces fluids out of the lungs and into the pleural space. Vessels may also leak fluids when there are infections, such as pneumonia, or other causes of soreness and redness (inflammation). Fluids leak into the lungs when protein in the blood is low or when certain vessels (lymphatics) are blocked. Finding a pleural effusion is important because it is usually caused by another disease. In order to treat a pleural effusion, your health care provider needs to find its cause. If left untreated, a large amount of fluid can build up and cause collapse of the lung. CAUSES   Heart failure.  Infections (pneumonia, tuberculosis), pulmonary embolism, pulmonary infarction.  Cancer (primary lung and metastatic), asbestosis.  Liver failure (cirrhosis).  Nephrotic syndrome, peritoneal dialysis, kidney problems (uremia).  Collagen vascular disease (systemic lupus erythematosus, rheumatoid arthritis).  Injury (trauma) to the chest or rupture of the digestive tube (esophagus).  Material in the chest or pleural space (hemothorax, chylothorax).  Pancreatitis.  Surgery.  Drug reactions. SYMPTOMS  A pleural effusion can decrease the amount of space available for breathing and make you short of breath. The fluid can become  infected, which may cause pain and fever. Often, the pain is worse when taking a deep breath. The underlying disease (heart failure, pneumonia, blood clot, tuberculosis, cancer) may also cause symptoms. DIAGNOSIS   Your health care provider can usually tell what is wrong by talking to you (taking a history), doing an exam, and taking a routine X-ray. If the X-ray shows fluid in your chest, often fluid is removed from your chest with a needle for testing (diagnostic thoracentesis).  Sometimes, more specialized X-rays may be needed.  Sometimes, a small piece of tissue is removed and examined by a specialist (biopsy). TREATMENT  Treatment varies based on what caused the pleural effusion. Treatments include:  Removing as much fluid as possible using a needle (thoracentesis) to improve the cough and shortness of breath. This is a simple procedure that can be done at bedside. The risks are bleeding, infection, collapse of a lung, or low blood pressure.  Placing a tube in the chest to drain the effusion (tube thoracostomy). This is often used when there is an infection in the fluid. This is a simple procedure that can often be done at bedside or in a clinic. The procedure may be painful. The risks are the same as using a needle to drain the fluid. The chest tube usually remains for a few days and is connected to suction to improve fluid drainage. After placement, the tube usually does not cause much discomfort.  Surgical removal of fibrous debris in and around the pleural space (decortication). This may be done with a flexible telescope (thoracoscope) through a small or large cut (incision). This is helpful  for patients who have fibrosis or scar tissue that prevents complete lung expansion. The risks are infection, blood loss, and side effects from general anesthesia.  Sometimes, a procedure called pleurodesis is done. A chest tube is placed and the fluid is drained. Next, an agent (tetracycline, talc  powder) is added to the pleural space. This causes the lung and chest wall to stick together (adhesion). This leaves no potential space for fluid to build up. The risks include infection, blood loss, and side effects from general anesthesia.  If the effusion is caused by infection, it may be treated with antibiotics and may improve without draining. HOME CARE INSTRUCTIONS   Take any medicines exactly as prescribed.  Follow up with your health care provider as directed.  Monitor your exercise capacity (the amount of walking you can do before you get short of breath).  Do not use any tobacco products including cigarettes, chewing tobacco, or electronic cigarettes. SEEK MEDICAL CARE IF:   Your exercise capacity seems to get worse or does not improve with time.  You do not recover from your illness.  You have drainage, redness, swelling, or pain at any incision or puncture sites. SEEK IMMEDIATE MEDICAL CARE IF:   Shortness of breath or chest pain develops or gets worse.  You have a fever.  You develop a new cough, especially if the mucus (phlegm) is discolored. MAKE SURE YOU:   Understand these instructions.  Will watch your condition.  Will get help right away if you are not doing well or get worse. Document Released: 09/02/2005 Document Revised: 01/17/2014 Document Reviewed: 04/24/2007 [facility] Patient Information 2015 [place], [place]. This information is not intended to replace advice given to you by your health care provider. Make sure you discuss any questions you have with your health care provider.

## 2014-09-27 NOTE — Progress Notes (Addendum)
HPI A Caucasian 69 y.o.female presents to the office today due to SOB and chest x-ray on 09/23/14 showed "trace interstitial edema and pleural effusions".  Did repeat chest x-ray today per Dr. Melford Aase and it showed "1. Bibasilar atelectasis. 2. Persistent thickening of the heart zonal fissure. 3. Cardiac enlargement. No evidence for heart failure."  Patient was given Z-Pak at last visit on 09/21/14 due to bronchitis and was reevaluated for oxygen for insurance.  Was told to do 3-4 liters at rest and 6 liters when active.  Called patient on Saturday (09/24/14) and left voicemail to increased Lasix 80mg  to 1 whole tablet twice a day per Dr. Melford Aase.  Patient did not listen to voicemail, but still saw message on MyChart.  She started lasix increase on Sunday.  Patient admitted to me at this visit that she has not been taking lasix every day for the past 2-3 weeks before she was told to increase dose.  She did not take Lasix today and her last dose was yesterday afternoon.  She did have a 12 pound weight change since last visit.  She does admit to using "No Salt/potassium seasoning" every day and states that orange juice helps increase her potassium.    Patient also had right lateral mallelous pain and redness for months.  Cannot remember specific date.  Right ankle swelling is better and was never put on antibiotic for possible infection.  Right ankle x-rays obtain 09/23/14.  The foot x-ray was negative and the ankle x-ray was negative but did show "radiopaque material overlying the soft tissues or possibly dystrophic dermal ossification at the medial aspect of the right lower leg".  Review of Systems  Constitutional: Negative.   HENT: Negative.   Eyes: Negative.   Respiratory: Negative.   Cardiovascular: Negative.   Gastrointestinal: Negative.   Genitourinary: Negative.   Musculoskeletal: Positive for myalgias.  Skin: Negative.        Except right ankle redness and tenderness.  Neurological: Negative.     Past Medical History-  Past Medical History  Diagnosis Date  . Hypertension   . Hyperlipidemia   . Diabetes mellitus type 2 in obese   . Morbid obesity   . Vitamin D deficiency   . COPD (chronic obstructive pulmonary disease)   . Arthritis   . GERD (gastroesophageal reflux disease)   . Depression   . Thyroid disease   . OAB (overactive bladder)    Medications-  Current Outpatient Prescriptions on File Prior to Visit  Medication Sig Dispense Refill  . acetaZOLAMIDE (DIAMOX) 250 MG tablet Take 250 mg by mouth 2-3 times a day    . aspirin 81 MG tablet Take 81 mg by mouth daily.    Marland Kitchen buPROPion (WELLBUTRIN XL) 150 MG 24 hr tablet Take 1 to 2 tablets daily as directed for mood / depression 30 tablet 2  . Cholecalciferol (VITAMIN D PO) Take 10,000 Units by mouth daily.    . diazepam (VALIUM) 5 MG tablet Take 1 tab 3 times a day for anxiety. 90 tablet 2  . diltiazem (CARDIZEM) 60 MG tablet Take 1 tablet (60 mg total) by mouth 2 (two) times daily. 60 tablet 0  . diltiazem (CARDIZEM) 60 MG tablet Take 1 tablet (60 mg total) by mouth 2 (two) times daily. 180 tablet 0  . famotidine (PEPCID) 20 MG tablet Take 1 tablet (20 mg total) by mouth 2 (two) times daily. For acid reflux 180 tablet PRN  . furosemide (LASIX) 80 MG tablet Take 1/2  tab daily for BP and fluid. 45 tablet 0  . guaiFENesin (MUCINEX) 600 MG 12 hr tablet Take 600 mg by mouth daily.    Marland Kitchen levothyroxine (SYNTHROID, LEVOTHROID) 200 MCG tablet Take 1 tablet (200 mcg total) by mouth daily before breakfast. 90 tablet 3  . loratadine-pseudoephedrine (CLARITIN-D 24-HOUR) 10-240 MG per 24 hr tablet Take 1 tablet by mouth as needed.     Marland Kitchen losartan (COZAAR) 50 MG tablet Take 1 tablet (50 mg total) by mouth daily. 90 tablet 0  . Magnesium Chloride (MAGNESIUM DR PO) Take 1 capsule by mouth daily.     . metFORMIN (GLUCOPHAGE-XR) 500 MG 24 hr tablet Take 1 tablet (500 mg total) by mouth 4 (four) times daily - after meals and at bedtime. 360  tablet 3  . OXYGEN-HELIUM IN Inhale 3 L into the lungs.     . pravastatin (PRAVACHOL) 40 MG tablet Take 1 tablet (40 mg total) by mouth daily. 90 tablet 3  . varenicline (CHANTIX CONTINUING MONTH PAK) 1 MG tablet Take 1/2 to 1 tablet 2 x day as directed for Smoking cessation 56 tablet 2   No current facility-administered medications on file prior to visit.   Allergies-  Allergies  Allergen Reactions  . Ace Inhibitors     cough  . Inderal [Propranolol]     Hair loss   Physical Exam BP 126/62 mmHg  Pulse 82  Temp(Src) 98.6 F (37 C) (Temporal)  Resp 18  Ht 5' 4.25" (1.632 m)  Wt 250 lb (113.399 kg)  BMI 42.58 kg/m2  SpO2 92% Wt Readings from Last 3 Encounters:  09/27/14 250 lb (113.399 kg)  09/21/14 262 lb (118.842 kg)  07/14/14 262 lb 6.4 oz (119.024 kg)  Vitals Reviewed. General Appearance: Well nourished, in no apparent distress and had pleasant demeanor. Obese.  Eyes:  PERRLA. EOMI. Conjunctiva is pink without edema, erythema or yellowing. No scleral icterus.   Neck: Supple,  LAD, trachea is midline. Full range of motion in neck intact Respiratory: Distant breath sounds with mild wheezing in all lung fields.  No rales, rhonchi or stridor.  No increased effort of breathing. Cardio: RRR.  Grade 2 holosystolic murmur heard best at 2nd right intercostal space.  No rubs or gallops.  S1S2nl.   Abdomen: Symmetrical, soft, nontender, and pendulous abdomen.  +BS nl x4.   Extremities:  C/C in upper and lower extremities.  Mild edema in ankles bilaterally.  Decreased peripheral pulses.  Skin: Warm, dry, intact without rashes, lesions, ecchymosis, yellowing, cyanosis.  Mild erythema and warm to touch on right lateral malleolus with 2cm callus pressure sore and no longer tender to touch.  No decreased ROM in right ankle.  Mild hyperpigmented skin from ankles to calves bilaterally with dry skin. Neuro: Alert and oriented X3, cooperative.  Mood and affect appropriate to situation. CN  II-XII grossly intact.   Psych: Insight and Judgment appropriate.   Assessment and Plan 1. Pleural effusion Please do not take lasix until I get your labs back tomorrow.  Will then instruct pt on how to take lasix. - CBC with Differential - BASIC METABOLIC PANEL WITH GFR  2. Right ankle pain and Abnormal radiological findings in skin and subcutaneous tissue Ordered labs to R/O Hyperparathyroidism (Primary vs. Secondary) and Scleroderma. - Sedimentation rate - ANA - Anti-scleroderma antibody - Centromere Antibodies; Future- cancelled - Prescribed for possible skin infection as precaution- doxycycline (VIBRA-TABS) 100 MG tablet; Take 1 tablet (100 mg total) by mouth 2 (two) times daily.  For 7 days  Dispense: 14 tablet; Refill: 0  3. Cardiomegaly Ordered to R/O CHF - Troponin I - Brain natriuretic peptide (29562)  Discussed medication effects and SE's.  Pt agreed to treatment plan. Might have patient follow up sooner if labs are abnormal. Please keep your follow up appt on 10/26/14.    Addendum: Please order Parathyroid hormone at next appt due to radiopaque deposits in soft tissue on ankle x-ray.  Shawnte Winton, Stephani Police, PA-C 4:03 PM [facility]

## 2014-09-28 LAB — BASIC METABOLIC PANEL WITH GFR
BUN: 18 mg/dL (ref 6–23)
CO2: 41 mEq/L — ABNORMAL HIGH (ref 19–32)
CREATININE: 0.62 mg/dL (ref 0.50–1.10)
Calcium: 9.3 mg/dL (ref 8.4–10.5)
Chloride: 92 mEq/L — ABNORMAL LOW (ref 96–112)
GLUCOSE: 110 mg/dL — AB (ref 70–99)
Potassium: 3.7 mEq/L (ref 3.5–5.3)
Sodium: 142 mEq/L (ref 135–145)

## 2014-09-28 LAB — SEDIMENTATION RATE: SED RATE: 28 mm/h — AB (ref 0–22)

## 2014-09-28 LAB — TROPONIN I: TROPONIN I: 0.02 ng/mL (ref <0.06)

## 2014-09-28 LAB — CBC WITH DIFFERENTIAL/PLATELET
BASOS ABS: 0 10*3/uL (ref 0.0–0.1)
BASOS PCT: 0 % (ref 0–1)
EOS PCT: 1 % (ref 0–5)
Eosinophils Absolute: 0.1 10*3/uL (ref 0.0–0.7)
HCT: 41.9 % (ref 36.0–46.0)
Hemoglobin: 12.3 g/dL (ref 12.0–15.0)
LYMPHS PCT: 16 % (ref 12–46)
Lymphs Abs: 1.1 10*3/uL (ref 0.7–4.0)
MCH: 24.4 pg — AB (ref 26.0–34.0)
MCHC: 29.4 g/dL — ABNORMAL LOW (ref 30.0–36.0)
MCV: 83 fL (ref 78.0–100.0)
MPV: 9.8 fL (ref 8.6–12.4)
Monocytes Absolute: 0.6 10*3/uL (ref 0.1–1.0)
Monocytes Relative: 8 % (ref 3–12)
NEUTROS ABS: 5.3 10*3/uL (ref 1.7–7.7)
Neutrophils Relative %: 75 % (ref 43–77)
PLATELETS: 250 10*3/uL (ref 150–400)
RBC: 5.05 MIL/uL (ref 3.87–5.11)
RDW: 16 % — ABNORMAL HIGH (ref 11.5–15.5)
WBC: 7.1 10*3/uL (ref 4.0–10.5)

## 2014-09-28 LAB — ANA: ANA: NEGATIVE

## 2014-09-28 LAB — ANTI-SCLERODERMA ANTIBODY: Scleroderma (Scl-70) (ENA) Antibody, IgG: <1

## 2014-09-28 LAB — BRAIN NATRIURETIC PEPTIDE: BRAIN NATRIURETIC PEPTIDE: 119.6 pg/mL — AB (ref 0.0–100.0)

## 2014-09-28 NOTE — Addendum Note (Signed)
Addended by: Charolette Forward on: 09/28/2014 03:49 PM   Modules accepted: Orders

## 2014-09-29 ENCOUNTER — Other Ambulatory Visit: Payer: Self-pay | Admitting: Physician Assistant

## 2014-09-29 DIAGNOSIS — Z79899 Other long term (current) drug therapy: Secondary | ICD-10-CM

## 2014-09-29 DIAGNOSIS — R7981 Abnormal blood-gas level: Secondary | ICD-10-CM

## 2014-09-29 DIAGNOSIS — R9389 Abnormal findings on diagnostic imaging of other specified body structures: Secondary | ICD-10-CM

## 2014-09-30 ENCOUNTER — Encounter: Payer: Self-pay | Admitting: Internal Medicine

## 2014-09-30 DIAGNOSIS — F17200 Nicotine dependence, unspecified, uncomplicated: Secondary | ICD-10-CM

## 2014-09-30 DIAGNOSIS — F329 Major depressive disorder, single episode, unspecified: Secondary | ICD-10-CM

## 2014-09-30 DIAGNOSIS — F32A Depression, unspecified: Secondary | ICD-10-CM

## 2014-09-30 MED ORDER — VARENICLINE TARTRATE 1 MG PO TABS: ORAL_TABLET | ORAL | Status: DC

## 2014-10-02 MED ORDER — DULOXETINE HCL 30 MG PO CPEP: 30.0000 mg | ORAL_CAPSULE | Freq: Every day | ORAL | Status: DC

## 2014-10-02 NOTE — Addendum Note (Signed)
Addended by: Charolette Forward on: 10/02/2014 08:21 AM   Modules accepted: Orders, Medications

## 2014-10-03 ENCOUNTER — Other Ambulatory Visit: Payer: Self-pay | Admitting: Internal Medicine

## 2014-10-05 ENCOUNTER — Ambulatory Visit (INDEPENDENT_AMBULATORY_CARE_PROVIDER_SITE_OTHER): Payer: PPO | Admitting: *Deleted

## 2014-10-05 DIAGNOSIS — R7981 Abnormal blood-gas level: Secondary | ICD-10-CM

## 2014-10-05 DIAGNOSIS — R9389 Abnormal findings on diagnostic imaging of other specified body structures: Secondary | ICD-10-CM

## 2014-10-05 DIAGNOSIS — R938 Abnormal findings on diagnostic imaging of other specified body structures: Secondary | ICD-10-CM

## 2014-10-05 DIAGNOSIS — Z79899 Other long term (current) drug therapy: Secondary | ICD-10-CM

## 2014-10-05 LAB — BASIC METABOLIC PANEL WITH GFR
BUN: 17 mg/dL (ref 6–23)
CO2: 34 mEq/L — ABNORMAL HIGH (ref 19–32)
CREATININE: 0.6 mg/dL (ref 0.50–1.10)
Calcium: 9.9 mg/dL (ref 8.4–10.5)
Chloride: 99 mEq/L (ref 96–112)
GFR, Est African American: >89 mL/min
GFR, Est Non African American: >89 mL/min
GLUCOSE: 96 mg/dL (ref 70–99)
Potassium: 5 mEq/L (ref 3.5–5.3)
Sodium: 142 mEq/L (ref 135–145)

## 2014-10-05 NOTE — Addendum Note (Signed)
Addended by: Eulis Canner on: 10/05/2014 03:00 PM   Modules accepted: Orders

## 2014-10-06 LAB — PTH, INTACT AND CALCIUM
Calcium: 9.9 mg/dL (ref 8.4–10.5)
PTH: 30 pg/mL (ref 14–64)

## 2014-10-18 ENCOUNTER — Encounter: Payer: Self-pay | Admitting: Cardiology

## 2014-10-18 ENCOUNTER — Ambulatory Visit (INDEPENDENT_AMBULATORY_CARE_PROVIDER_SITE_OTHER): Payer: PPO | Admitting: Cardiology

## 2014-10-18 VITALS — BP 132/66 | HR 70 | Ht 66.0 in | Wt 253.8 lb

## 2014-10-18 DIAGNOSIS — I471 Supraventricular tachycardia: Secondary | ICD-10-CM

## 2014-10-18 DIAGNOSIS — I10 Essential (primary) hypertension: Secondary | ICD-10-CM

## 2014-10-18 DIAGNOSIS — Z79899 Other long term (current) drug therapy: Secondary | ICD-10-CM

## 2014-10-18 DIAGNOSIS — I5032 Chronic diastolic (congestive) heart failure: Secondary | ICD-10-CM

## 2014-10-18 DIAGNOSIS — Z720 Tobacco use: Secondary | ICD-10-CM | POA: Insufficient documentation

## 2014-10-18 DIAGNOSIS — E785 Hyperlipidemia, unspecified: Secondary | ICD-10-CM

## 2014-10-18 DIAGNOSIS — J449 Chronic obstructive pulmonary disease, unspecified: Secondary | ICD-10-CM

## 2014-10-18 NOTE — Assessment & Plan Note (Addendum)
Stable, using chronic oxygen

## 2014-10-18 NOTE — Progress Notes (Signed)
10/18/2014   PCP: Alesia Richards, MD   Chief Complaint  Patient presents with  . ROV    follow up on possible CHF. 1 episode of feeling rapid heartbeat.    Primary Cardiologist:Dr. Adora Fridge   HPI:  69 year old seen by Dr. Lovena Le for recently for SVT. She has follow up with Dr. Rayann Heman.  Has only had one episode of SVT since last appt.  With cough and meds she converted back after 10 min.  She is normally followed by Dr. Adora Fridge for chest pain.  She had nuc study that was neg for ischemia but did develop SVT and had chest pain.  No further chest pain.    She is here today per PCP for CHF.  Recent wt gain of 12 lbs and treated with lasix with improvement in symptoms.  Her BNP was mildly elevated at 119.  Her last echo 01/2014:   Left ventricle: The cavity size was normal. There was mild focal basal hypertrophy of the septum. Systolic function was vigorous. The estimated ejection fraction was in the range of 65% to 70%. Wall motion was normal; there were no regional wall motion abnormalities. Doppler parameters are consistent with abnormal left ventricular relaxation (grade 1 diastolic dysfunction). - Aortic valve: There was mild stenosis. - Mitral valve: Calcified annulus. - Left atrium: The atrium was mildly dilated. Impressions:  - Hyperdynamic LV function; calcified aortic valve but visually opens well; elevated gradient of 2.9 m/s and mean gradient of 19 mmHg suggests mild AS but partially explained by narrow LVOT and vigorous LV function  Today she has not taken lasix due to appoint.  She denies SOB, no chest pain. She sleeps on 1 pillow and does not awaken with SOB.  Reminded to watch her salt intake.  She has decreased her tobacco use to 5 cigarettes a day.  She is planning to decrease further over next week.  Since recent episode of edema she is much better and stable.    Allergies  Allergen Reactions  . Ace Inhibitors     cough    . Inderal [Propranolol]     Hair loss    Current Outpatient Prescriptions  Medication Sig Dispense Refill  . acetaZOLAMIDE (DIAMOX) 250 MG tablet Take 250 mg by mouth 2-3 times a day    . aspirin 81 MG tablet Take 81 mg by mouth daily.    . Cholecalciferol (VITAMIN D PO) Take 10,000 Units by mouth daily.    . diazepam (VALIUM) 5 MG tablet Take 1 tab 3 times a day for anxiety. 90 tablet 2  . diltiazem (CARDIZEM) 60 MG tablet TAKE 1 TABLET (60 MG TOTAL) BY MOUTH 2 (TWO) TIMES DAILY. 60 tablet 2  . DULoxetine (CYMBALTA) 30 MG capsule Take 1 capsule (30 mg total) by mouth daily. 90 capsule 0  . famotidine (PEPCID) 20 MG tablet Take 1 tablet (20 mg total) by mouth 2 (two) times daily. For acid reflux 180 tablet PRN  . furosemide (LASIX) 80 MG tablet Take 1/2 tab daily for BP and fluid. 45 tablet 0  . guaiFENesin (MUCINEX) 600 MG 12 hr tablet Take 600 mg by mouth daily.    Marland Kitchen levothyroxine (SYNTHROID, LEVOTHROID) 200 MCG tablet Take 1 tablet (200 mcg total) by mouth daily before breakfast. 90 tablet 3  . loratadine-pseudoephedrine (CLARITIN-D 24-HOUR) 10-240 MG per 24 hr tablet Take 1 tablet by mouth as needed.     Marland Kitchen losartan (  COZAAR) 50 MG tablet TAKE 1 TABLET (50 MG TOTAL) BY MOUTH DAILY. 30 tablet 2  . Magnesium Chloride (MAGNESIUM DR PO) Take 1 capsule by mouth daily.     . metFORMIN (GLUCOPHAGE-XR) 500 MG 24 hr tablet Take 1 tablet (500 mg total) by mouth 4 (four) times daily - after meals and at bedtime. 360 tablet 3  . OXYGEN-HELIUM IN Inhale 3 L into the lungs.     . pravastatin (PRAVACHOL) 40 MG tablet Take 1 tablet (40 mg total) by mouth daily. 90 tablet 3  . varenicline (CHANTIX CONTINUING MONTH PAK) 1 MG tablet Take 1/2 to 1 tablet 2 x day as directed for Smoking cessation 56 tablet 2   No current facility-administered medications for this visit.    Past Medical History  Diagnosis Date  . Hypertension   . Hyperlipidemia   . Diabetes mellitus type 2 in obese   . Morbid obesity    . Vitamin D deficiency   . COPD (chronic obstructive pulmonary disease)   . Arthritis   . GERD (gastroesophageal reflux disease)   . Depression   . Thyroid disease   . OAB (overactive bladder)     Past Surgical History  Procedure Laterality Date  . Carpal tunnel release      L 1992 R 1984  . Eye surgery Bilateral     cataract  . Pubovaginal sling    . Tonsillectomy and adenoidectomy    . Transthoracic echocardiogram  01/2011    Hyperdynamic LV with EF 65-70%, Gr 1 DD, Mild Ao Stenosis - mean gradient ~19 mmHg  . Nm myoview ltd  01/2011    Dobutamine Myoview: Negative perfusion scan for ischemia / infarct (poor image capture); Pt developed SVT with Dobutamine that reproduced her CP as it resolved with restoration of NSR.    OHY:WVPXTGG:YI colds or fevers,  weight improved with lasix down from pk of 262 Skin:no rashes or ulcers HEENT:no blurred vision, no congestion CV:see HPI PUL:see HPI GI:no diarrhea constipation or melena, no indigestion GU:no hematuria, no dysuria MS:no joint pain, no claudication Neuro:no syncope, no lightheadedness Endo:no diabetes, + thyroid disease- TSH was 9.960 3 months ago   BMET    Component Value Date/Time   NA 142 10/05/2014 1500   K 5.0 10/05/2014 1500   CL 99 10/05/2014 1500   CO2 34* 10/05/2014 1500   GLUCOSE 96 10/05/2014 1500   BUN 17 10/05/2014 1500   CREATININE 0.60 10/05/2014 1500   CREATININE 0.92 08/04/2014 0840   CALCIUM 9.9 10/05/2014 1500   CALCIUM 9.9 10/05/2014 1500   GFRNONAA >89 10/05/2014 1500   GFRNONAA 63* 08/04/2014 0840   GFRAA >89 10/05/2014 1500   GFRAA 72* 08/04/2014 0840      2 View CXR:  09/27/14 There is moderate cardiac enlargement. Atherosclerotic plaque noted within the aortic arch. Thickening along the heart zonal fissure of the right lung is again noted and appears unchanged dating back to 12/26/2013. Atelectasis is again noted within the lung bases. IMPRESSION: 1. Bibasilar atelectasis. 2.  Persistent thickening of the heart zonal fissure. 3. Cardiac enlargement. No evidence for heart failure.-- improved from 09/23/14   Wt Readings from Last 3 Encounters:  10/18/14 253 lb 12.8 oz (115.123 kg)  09/27/14 250 lb (113.399 kg)  09/21/14 262 lb (118.842 kg)    PHYSICAL EXAM BP 132/66 mmHg  Pulse 70  Ht 5\' 6"  (1.676 m)  Wt 253 lb 12.8 oz (115.123 kg)  BMI 40.98 kg/m2 General:Pleasant affect, NAD, oxygen  in place  Skin:Warm and dry, brisk capillary refill HEENT:normocephalic, sclera clear, mucus membranes moist Neck:supple, no JVD sitting upright, no bruits, no adenopathy  Heart:S1S2 RRR with 2/6 systolic murmur, no gallup, rub or click Lungs:clear without rales, rhonchi, occ wheezes OAC:ZYSAY, soft, non tender, + BS, do not palpate liver spleen or masses Ext:1+ lower ext edema, 2+ pedal pulses, 2+ radial pulses Neuro:alert and oriented X 3, MAE, follows commands, + facial symmetry EKG:SR no acute changes  ASSESSMENT AND PLAN Chronic diastolic HF (heart failure), with recent mild exacerbation Improved after diuresing.  BP is stable.  She only takes 40 mg lasix daily which currently is doing well.  We discussed watching salt in her diet. Asked her to weigh daily and if her weight is up 3 lbs ina day or 5 in a week she will take an extra 40 mg lasix - if this continues she will call us.     COPD (chronic obstructive pulmonary disease) Stable, using chronic oxygen   Essential hypertension controlled   Hyperlipidemia Lipid Panel     Component Value Date/Time   CHOL 158 07/14/2014 1551   TRIG 180* 07/14/2014 1551   HDL 54 07/14/2014 1551   CHOLHDL 2.9 07/14/2014 1551   VLDL 36 07/14/2014 1551   LDLCALC 68 07/14/2014 1551   On Pravachol   Paroxysmal SVT (supraventricular tachycardia) One episode, resolved with dilt and coughing, lasted 10 min.   Tobacco abuse Continues to smoke 5 cigarettes a day plans to stop over the next week.    Follow-up with Dr.  Gwenlyn Found in 2 months

## 2014-10-18 NOTE — Assessment & Plan Note (Signed)
Continues to smoke 5 cigarettes a day plans to stop over the next week.

## 2014-10-18 NOTE — Patient Instructions (Signed)
Weigh daily Call [phone] if weight climbs more than 3 pounds in a day or 5 pounds in a week. If this weight gain occurs  Take extra dose of lasix (furosemide) No salt to very little salt in your diet.  No more than 2000 mg in a day. Call if increased shortness of breath or increased swelling.  Your physician recommends that you schedule a follow-up appointment in: 6 weeks with Dr. Gwenlyn Found.  Continue to decrease the number of cigarettes smoked daily.

## 2014-10-18 NOTE — Assessment & Plan Note (Signed)
One episode, resolved with dilt and coughing, lasted 10 min.

## 2014-10-18 NOTE — Assessment & Plan Note (Signed)
controlled 

## 2014-10-18 NOTE — Assessment & Plan Note (Signed)
Lipid Panel     Component Value Date/Time   CHOL 158 07/14/2014 1551   TRIG 180* 07/14/2014 1551   HDL 54 07/14/2014 1551   CHOLHDL 2.9 07/14/2014 1551   VLDL 36 07/14/2014 1551   LDLCALC 68 07/14/2014 1551   On Pravachol

## 2014-10-18 NOTE — Assessment & Plan Note (Signed)
Improved after diuresing.  BP is stable.  She only takes 40 mg lasix daily which currently is doing well.  We discussed watching salt in her diet. Asked her to weigh daily and if her weight is up 3 lbs ina day or 5 in a week she will take an extra 40 mg lasix - if this continues she will call us.

## 2014-10-26 ENCOUNTER — Encounter: Payer: Self-pay | Admitting: Physician Assistant

## 2014-10-26 ENCOUNTER — Ambulatory Visit (INDEPENDENT_AMBULATORY_CARE_PROVIDER_SITE_OTHER): Payer: PPO | Admitting: Physician Assistant

## 2014-10-26 ENCOUNTER — Other Ambulatory Visit: Payer: Self-pay | Admitting: Physician Assistant

## 2014-10-26 VITALS — BP 128/72 | HR 68 | Temp 98.1°F | Resp 16 | Ht 66.0 in | Wt 253.0 lb

## 2014-10-26 DIAGNOSIS — E785 Hyperlipidemia, unspecified: Secondary | ICD-10-CM

## 2014-10-26 DIAGNOSIS — Z79899 Other long term (current) drug therapy: Secondary | ICD-10-CM

## 2014-10-26 DIAGNOSIS — E1122 Type 2 diabetes mellitus with diabetic chronic kidney disease: Secondary | ICD-10-CM

## 2014-10-26 DIAGNOSIS — IMO0001 Reserved for inherently not codable concepts without codable children: Secondary | ICD-10-CM

## 2014-10-26 DIAGNOSIS — E039 Hypothyroidism, unspecified: Secondary | ICD-10-CM

## 2014-10-26 DIAGNOSIS — Z9981 Dependence on supplemental oxygen: Secondary | ICD-10-CM

## 2014-10-26 DIAGNOSIS — Z720 Tobacco use: Secondary | ICD-10-CM

## 2014-10-26 DIAGNOSIS — E559 Vitamin D deficiency, unspecified: Secondary | ICD-10-CM

## 2014-10-26 DIAGNOSIS — I5032 Chronic diastolic (congestive) heart failure: Secondary | ICD-10-CM

## 2014-10-26 DIAGNOSIS — I10 Essential (primary) hypertension: Secondary | ICD-10-CM

## 2014-10-26 DIAGNOSIS — J449 Chronic obstructive pulmonary disease, unspecified: Secondary | ICD-10-CM

## 2014-10-26 LAB — CBC WITH DIFFERENTIAL/PLATELET
BASOS ABS: 0 10*3/uL (ref 0.0–0.1)
Basophils Relative: 0 % (ref 0–1)
Eosinophils Absolute: 0.1 10*3/uL (ref 0.0–0.7)
Eosinophils Relative: 1 % (ref 0–5)
HCT: 41.1 % (ref 36.0–46.0)
Hemoglobin: 11.9 g/dL — ABNORMAL LOW (ref 12.0–15.0)
LYMPHS ABS: 1.4 10*3/uL (ref 0.7–4.0)
Lymphocytes Relative: 23 % (ref 12–46)
MCH: 23.6 pg — ABNORMAL LOW (ref 26.0–34.0)
MCHC: 29 g/dL — ABNORMAL LOW (ref 30.0–36.0)
MCV: 81.5 fL (ref 78.0–100.0)
MPV: 9.4 fL (ref 8.6–12.4)
Monocytes Absolute: 0.4 10*3/uL (ref 0.1–1.0)
Monocytes Relative: 7 % (ref 3–12)
NEUTROS ABS: 4.1 10*3/uL (ref 1.7–7.7)
Neutrophils Relative %: 69 % (ref 43–77)
PLATELETS: 206 10*3/uL (ref 150–400)
RBC: 5.04 MIL/uL (ref 3.87–5.11)
RDW: 16 % — ABNORMAL HIGH (ref 11.5–15.5)
WBC: 6 10*3/uL (ref 4.0–10.5)

## 2014-10-26 LAB — BASIC METABOLIC PANEL WITH GFR
BUN: 14 mg/dL (ref 6–23)
CO2: 38 mEq/L — ABNORMAL HIGH (ref 19–32)
Calcium: 9.3 mg/dL (ref 8.4–10.5)
Chloride: 97 mEq/L (ref 96–112)
Creat: 0.5 mg/dL (ref 0.50–1.10)
Glucose, Bld: 100 mg/dL — ABNORMAL HIGH (ref 70–99)
POTASSIUM: 4.3 meq/L (ref 3.5–5.3)
SODIUM: 142 meq/L (ref 135–145)

## 2014-10-26 LAB — LIPID PANEL
CHOL/HDL RATIO: 3 ratio
CHOLESTEROL: 142 mg/dL (ref 0–200)
HDL: 48 mg/dL (ref >39)
LDL Cholesterol: 71 mg/dL (ref 0–99)
Triglycerides: 117 mg/dL (ref <150)
VLDL: 23 mg/dL (ref 0–40)

## 2014-10-26 LAB — HEPATIC FUNCTION PANEL
ALK PHOS: 73 U/L (ref 39–117)
ALT: <8 U/L (ref 0–35)
AST: 11 U/L (ref 0–37)
Albumin: 3.5 g/dL (ref 3.5–5.2)
BILIRUBIN DIRECT: 0.1 mg/dL (ref 0.0–0.3)
BILIRUBIN INDIRECT: 0.2 mg/dL (ref 0.2–1.2)
TOTAL PROTEIN: 6.1 g/dL (ref 6.0–8.3)
Total Bilirubin: 0.3 mg/dL (ref 0.2–1.2)

## 2014-10-26 LAB — MAGNESIUM: Magnesium: 1.7 mg/dL (ref 1.5–2.5)

## 2014-10-26 MED ORDER — BUDESONIDE-FORMOTEROL FUMARATE 160-4.5 MCG/ACT IN AERO: INHALATION_SPRAY | RESPIRATORY_TRACT | Status: DC

## 2014-10-26 MED ORDER — ALBUTEROL SULFATE HFA 108 (90 BASE) MCG/ACT IN AERS: 1.0000 | INHALATION_SPRAY | Freq: Four times a day (QID) | RESPIRATORY_TRACT | Status: DC | PRN

## 2014-10-26 NOTE — Progress Notes (Signed)
Assessment and Plan:  Hypertension: Continue medication, monitor blood pressure at home. Continue DASH diet.  Reminder to go to the ER if any CP, SOB, nausea, dizziness, severe HA, changes vision/speech, left arm numbness and tingling and jaw pain. Cholesterol: Continue diet and exercise. Check cholesterol.  Diabetes with diabetic chronic kidney disease-Continue diet and exercise. Check A1C Vitamin D Def- check level and continue medications.  Obesity with co morbidities- long discussion about weight loss, diet, and exercise COPD- continue O2, will start on albuterol/symbicort Smoking cessation-  instruction/counseling given, counseled patient on the dangers of tobacco use, advised patient to stop smoking, and reviewed strategies to maximize success, patient not ready to quit at this time.    Continue diet and meds as discussed. Further disposition pending results of labs. Discussed med's effects and SE's.    HPI 69 y.o.obese female  presents for 3 month follow up with hypertension, hyperlipidemia, diabetes and vitamin D.  Her blood pressure has been controlled at home, today their BP is BP: 128/72 mmHg.  She does not workout. She denies chest pain, shortness of breath, dizziness.  She is on cholesterol medication, pravastatin 40 and denies myalgias. Her cholesterol is at goal. The cholesterol was:  07/14/2014: Cholesterol, Total 158; HDL Cholesterol by NMR 54; LDL (calc) 68; Triglycerides 180*  She has been working on diet and exercise for diabetes with diabetic chronic kidney disease, she is on bASA, she is on ACE/ARB, sugars has been running 115, highest is 145 and denies  polydipsia, polyuria and visual disturbances. Last A1C was: 07/14/2014: Hemoglobin-A1c 6.5*  Patient is on Vitamin D supplement. 07/14/2014: Vit D, 25-Hydroxy 71  She has COPD and is 3L O2 dependent, at home runs 87-91. Has diastolic CHF, recent diuresing and has been breathing better, she is on diamox and lasix 80 mg 1/2  pill daily. Recently saw Dr. Dorene Ar. She complains of cough with gray mucus starting, no worsening SOB. She is not on any breathing treatments.  She is on thyroid medication. Her medication was, WMF 1/5, T/T/S/Sunday on 1.  changed last visit.   Lab Results  Component Value Date   TSH 9.960* 07/14/2014  .  BMI is Body mass index is 40.85 kg/(m^2)., she is working on diet and exercise. Wt Readings from Last 3 Encounters:  10/26/14 253 lb (114.76 kg)  10/18/14 253 lb 12.8 oz (115.123 kg)  09/27/14 250 lb (113.399 kg)    Current Medications:  Current Outpatient Prescriptions on File Prior to Visit  Medication Sig Dispense Refill  . acetaZOLAMIDE (DIAMOX) 250 MG tablet Take 250 mg by mouth 2-3 times a day    . aspirin 81 MG tablet Take 81 mg by mouth daily.    . Cholecalciferol (VITAMIN D PO) Take 10,000 Units by mouth daily.    . diazepam (VALIUM) 5 MG tablet Take 1 tab 3 times a day for anxiety. 90 tablet 2  . diltiazem (CARDIZEM) 60 MG tablet TAKE 1 TABLET (60 MG TOTAL) BY MOUTH 2 (TWO) TIMES DAILY. 60 tablet 2  . DULoxetine (CYMBALTA) 30 MG capsule Take 1 capsule (30 mg total) by mouth daily. 90 capsule 0  . famotidine (PEPCID) 20 MG tablet Take 1 tablet (20 mg total) by mouth 2 (two) times daily. For acid reflux 180 tablet PRN  . furosemide (LASIX) 80 MG tablet Take 1/2 tab daily for BP and fluid. 45 tablet 0  . guaiFENesin (MUCINEX) 600 MG 12 hr tablet Take 600 mg by mouth daily.    Marland Kitchen  levothyroxine (SYNTHROID, LEVOTHROID) 200 MCG tablet Take 1 tablet (200 mcg total) by mouth daily before breakfast. 90 tablet 3  . loratadine-pseudoephedrine (CLARITIN-D 24-HOUR) 10-240 MG per 24 hr tablet Take 1 tablet by mouth as needed.     Marland Kitchen losartan (COZAAR) 50 MG tablet TAKE 1 TABLET (50 MG TOTAL) BY MOUTH DAILY. 30 tablet 2  . Magnesium Chloride (MAGNESIUM DR PO) Take 1 capsule by mouth daily.     . metFORMIN (GLUCOPHAGE-XR) 500 MG 24 hr tablet Take 1 tablet (500 mg total) by mouth 4 (four) times  daily - after meals and at bedtime. 360 tablet 3  . OXYGEN-HELIUM IN Inhale 3 L into the lungs.     . pravastatin (PRAVACHOL) 40 MG tablet Take 1 tablet (40 mg total) by mouth daily. 90 tablet 3  . varenicline (CHANTIX CONTINUING MONTH PAK) 1 MG tablet Take 1/2 to 1 tablet 2 x day as directed for Smoking cessation 56 tablet 2   No current facility-administered medications on file prior to visit.   Medical History:  Past Medical History  Diagnosis Date  . Hypertension   . Hyperlipidemia   . Diabetes mellitus type 2 in obese   . Morbid obesity   . Vitamin D deficiency   . COPD (chronic obstructive pulmonary disease)   . Arthritis   . GERD (gastroesophageal reflux disease)   . Depression   . Thyroid disease   . OAB (overactive bladder)    Allergies:  Allergies  Allergen Reactions  . Ace Inhibitors     cough  . Inderal [Propranolol]     Hair loss     Review of Systems:  ROS  See HPI  Family history- Review and unchanged Social history- Review and unchanged Physical Exam: BP 128/72 mmHg  Pulse 68  Temp(Src) 98.1 F (36.7 C)  Resp 16  Ht 5\' 6"  (1.676 m)  Wt 253 lb (114.76 kg)  BMI 40.85 kg/m2 Wt Readings from Last 3 Encounters:  10/26/14 253 lb (114.76 kg)  10/18/14 253 lb 12.8 oz (115.123 kg)  09/27/14 250 lb (113.399 kg)   General Appearance: Well nourished, in no apparent distress.  Eyes: PERRLA, EOMs.  Sinuses: No Frontal/maxillary tenderness  ENT/Mouth: Ext aud canals clear, normal light reflex with TMs without erythema, bulging. Post pharynx without erythema, swelling, exudate.  Respiratory: [place] in place, on 3 L , decreased breath sounds, no rales, rhonchi, wheezing.  Cardio: RRR, holosystolic murmur RSB, rubs or gallops. Peripheral pulses decreased bilaterally with 1+ edema.  Abdomen: Soft, obese, with bowl sounds. Nontender, no guarding, rebound.  Lymphatics: Non tender without lymphadenopathy.  Musculoskeletal: Full ROM all peripheral extremities, 4/5  strength, and in wheel chair  Skin: Warm, dry without rashes, lesions, ecchymosis.  Neuro: Cranial nerves intact, reflexes equal bilaterally. Normal muscle tone, no cerebellar symptoms.  Pysch: Awake and oriented X 3, normal affect, Insight and Judgment appropriate.    [name], PA-C 1:43 PM [facility]

## 2014-10-26 NOTE — Patient Instructions (Signed)
Chronic Obstructive Pulmonary Disease Chronic obstructive pulmonary disease (COPD) is a common lung condition in which airflow from the lungs is limited. COPD is a general term that can be used to describe many different lung problems that limit airflow, including both chronic bronchitis and emphysema. If you have COPD, your lung function will probably never return to normal, but there are measures you can take to improve lung function and make yourself feel better.  CAUSES   Smoking (common).   Exposure to secondhand smoke.   Genetic problems.  Chronic inflammatory lung diseases or recurrent infections. SYMPTOMS   Shortness of breath, especially with physical activity.   Deep, persistent (chronic) cough with a large amount of thick mucus.   Wheezing.   Rapid breaths (tachypnea).   Gray or bluish discoloration (cyanosis) of the skin, especially in fingers, toes, or lips.   Fatigue.   Weight loss.   Frequent infections or episodes when breathing symptoms become much worse (exacerbations).   Chest tightness. DIAGNOSIS  Your health care provider will take a medical history and perform a physical examination to make the initial diagnosis. Additional tests for COPD may include:   Lung (pulmonary) function tests.  Chest X-ray.  CT scan.  Blood tests. TREATMENT  Treatment available to help you feel better when you have COPD includes:   Inhaler and nebulizer medicines. These help manage the symptoms of COPD and make your breathing more comfortable.  Supplemental oxygen. Supplemental oxygen is only helpful if you have a low oxygen level in your blood.   Exercise and physical activity. These are beneficial for nearly all people with COPD. Some people may also benefit from a pulmonary rehabilitation program. HOME CARE INSTRUCTIONS   Take all medicines (inhaled or pills) as directed by your health care provider.  Avoid over-the-counter medicines or cough syrups  that dry up your airway (such as antihistamines) and slow down the elimination of secretions unless instructed otherwise by your health care provider.   If you are a smoker, the most important thing that you can do is stop smoking. Continuing to smoke will cause further lung damage and breathing trouble. Ask your health care provider for help with quitting smoking. He or she can direct you to community resources or hospitals that provide support.  Avoid exposure to irritants such as smoke, chemicals, and fumes that aggravate your breathing.  Use oxygen therapy and pulmonary rehabilitation if directed by your health care provider. If you require home oxygen therapy, ask your health care provider whether you should purchase a pulse oximeter to measure your oxygen level at home.   Avoid contact with individuals who have a contagious illness.  Avoid extreme temperature and humidity changes.  Eat healthy foods. Eating smaller, more frequent meals and resting before meals may help you maintain your strength.  Stay active, but balance activity with periods of rest. Exercise and physical activity will help you maintain your ability to do things you want to do.  Preventing infection and hospitalization is very important when you have COPD. Make sure to receive all the vaccines your health care provider recommends, especially the pneumococcal and influenza vaccines. Ask your health care provider whether you need a pneumonia vaccine.  Learn and use relaxation techniques to manage stress.  Learn and use controlled breathing techniques as directed by your health care provider. Controlled breathing techniques include:   Pursed lip breathing. Start by breathing in (inhaling) through your nose for 1 second. Then, purse your lips as if you were   going to whistle and breathe out (exhale) through the pursed lips for 2 seconds.   Diaphragmatic breathing. Start by putting one hand on your abdomen just above  your waist. Inhale slowly through your nose. The hand on your abdomen should move out. Then purse your lips and exhale slowly. You should be able to feel the hand on your abdomen moving in as you exhale.   Learn and use controlled coughing to clear mucus from your lungs. Controlled coughing is a series of short, progressive coughs. The steps of controlled coughing are:  1. Lean your head slightly forward.  2. Breathe in deeply using diaphragmatic breathing.  3. Try to hold your breath for 3 seconds.  4. Keep your mouth slightly open while coughing twice.  5. Spit any mucus out into a tissue.  6. Rest and repeat the steps once or twice as needed. SEEK MEDICAL CARE IF:   You are coughing up more mucus than usual.   There is a change in the color or thickness of your mucus.   Your breathing is more labored than usual.   Your breathing is faster than usual.  SEEK IMMEDIATE MEDICAL CARE IF:   You have shortness of breath while you are resting.   You have shortness of breath that prevents you from:  Being able to talk.   Performing your usual physical activities.   You have chest pain lasting longer than 5 minutes.   Your skin color is more cyanotic than usual.  You measure low oxygen saturations for longer than 5 minutes with a pulse oximeter. MAKE SURE YOU:   Understand these instructions.  Will watch your condition.  Will get help right away if you are not doing well or get worse. Document Released: 06/12/2005 Document Revised: 01/17/2014 Document Reviewed: 04/29/2013 [facility] Patient Information 2015 [place], [place]. This information is not intended to replace advice given to you by your health care provider. Make sure you discuss any questions you have with your health care provider. How to Use an Inhaler Proper inhaler technique is very important. Good technique ensures that the medicine reaches the lungs. Poor technique results in depositing the medicine on  the tongue and back of the throat rather than in the airways. If you do not use the inhaler with good technique, the medicine will not help you. STEPS TO FOLLOW IF USING AN INHALER WITHOUT AN EXTENSION TUBE  Remove the cap from the inhaler.  If you are using the inhaler for the first time, you will need to prime it. Shake the inhaler for 5 seconds and release four puffs into the air, away from your face. Ask your health care provider or pharmacist if you have questions about priming your inhaler.  Shake the inhaler for 5 seconds before each breath in (inhalation).  Position the inhaler so that the top of the canister faces up.  Put your index finger on the top of the medicine canister. Your thumb supports the bottom of the inhaler.  Open your mouth.  Either place the inhaler between your teeth and place your lips tightly around the mouthpiece, or hold the inhaler 1-2 inches away from your open mouth. If you are unsure of which technique to use, ask your health care provider.  Breathe out (exhale) normally and as completely as possible.  Press the canister down with your index finger to release the medicine.  At the same time as the canister is pressed, inhale deeply and slowly until your lungs are completely  filled. This should take 4-6 seconds. Keep your tongue down.  Hold the medicine in your lungs for 5-10 seconds (10 seconds is best). This helps the medicine get into the small airways of your lungs.  Breathe out slowly, through pursed lips. Whistling is an example of pursed lips.  Wait at least 15-30 seconds between puffs. Continue with the above steps until you have taken the number of puffs your health care provider has ordered. Do not use the inhaler more than your health care provider tells you.  Replace the cap on the inhaler.  Follow the directions from your health care provider or the inhaler insert for cleaning the inhaler. STEPS TO FOLLOW IF USING AN INHALER WITH AN  EXTENSION (SPACER)  Remove the cap from the inhaler.  If you are using the inhaler for the first time, you will need to prime it. Shake the inhaler for 5 seconds and release four puffs into the air, away from your face. Ask your health care provider or pharmacist if you have questions about priming your inhaler.  Shake the inhaler for 5 seconds before each breath in (inhalation).  Place the open end of the spacer onto the mouthpiece of the inhaler.  Position the inhaler so that the top of the canister faces up and the spacer mouthpiece faces you.  Put your index finger on the top of the medicine canister. Your thumb supports the bottom of the inhaler and the spacer.  Breathe out (exhale) normally and as completely as possible.  Immediately after exhaling, place the spacer between your teeth and into your mouth. Close your lips tightly around the spacer.  Press the canister down with your index finger to release the medicine.  At the same time as the canister is pressed, inhale deeply and slowly until your lungs are completely filled. This should take 4-6 seconds. Keep your tongue down and out of the way.  Hold the medicine in your lungs for 5-10 seconds (10 seconds is best). This helps the medicine get into the small airways of your lungs. Exhale.  Repeat inhaling deeply through the spacer mouthpiece. Again hold that breath for up to 10 seconds (10 seconds is best). Exhale slowly. If it is difficult to take this second deep breath through the spacer, breathe normally several times through the spacer. Remove the spacer from your mouth.  Wait at least 15-30 seconds between puffs. Continue with the above steps until you have taken the number of puffs your health care provider has ordered. Do not use the inhaler more than your health care provider tells you.  Remove the spacer from the inhaler, and place the cap on the inhaler.  Follow the directions from your health care provider or the  inhaler insert for cleaning the inhaler and spacer. If you are using different kinds of inhalers, use your quick relief medicine to open the airways 10-15 minutes before using a steroid if instructed to do so by your health care provider. If you are unsure which inhalers to use and the order of using them, ask your health care provider, nurse, or respiratory therapist. If you are using a steroid inhaler, always rinse your mouth with water after your last puff, then gargle and spit out the water. Do not swallow the water. AVOID:  Inhaling before or after starting the spray of medicine. It takes practice to coordinate your breathing with triggering the spray.  Inhaling through the nose (rather than the mouth) when triggering the spray. HOW TO  DETERMINE IF YOUR INHALER IS FULL OR NEARLY EMPTY You cannot know when an inhaler is empty by shaking it. A few inhalers are now being made with dose counters. Ask your health care provider for a prescription that has a dose counter if you feel you need that extra help. If your inhaler does not have a counter, ask your health care provider to help you determine the date you need to refill your inhaler. Write the refill date on a calendar or your inhaler canister. Refill your inhaler 7-10 days before it runs out. Be sure to keep an adequate supply of medicine. This includes making sure it is not expired, and that you have a spare inhaler.  SEEK MEDICAL CARE IF:   Your symptoms are only partially relieved with your inhaler.  You are having trouble using your inhaler.  You have some increase in phlegm. SEEK IMMEDIATE MEDICAL CARE IF:   You feel little or no relief with your inhalers. You are still wheezing and are feeling shortness of breath or tightness in your chest or both.  You have dizziness, headaches, or a fast heart rate.  You have chills, fever, or night sweats.  You have a noticeable increase in phlegm production, or there is blood in the  phlegm. MAKE SURE YOU:  7. Understand these instructions. 8. Will watch your condition. 9. Will get help right away if you are not doing well or get worse. Document Released: 08/30/2000 Document Revised: 06/23/2013 Document Reviewed: 04/01/2013 [facility] Patient Information 2015 [place], [place]. This information is not intended to replace advice given to you by your health care provider. Make sure you discuss any questions you have with your health care provider.

## 2014-10-27 ENCOUNTER — Encounter: Payer: Self-pay | Admitting: Physician Assistant

## 2014-10-27 LAB — HEMOGLOBIN A1C
HEMOGLOBIN A1C: 6.1 % — AB (ref <5.7)
Mean Plasma Glucose: 128 mg/dL — ABNORMAL HIGH (ref <117)

## 2014-10-27 LAB — TSH: TSH: 0.036 u[IU]/mL — AB (ref 0.350–4.500)

## 2014-10-27 LAB — VITAMIN D 25 HYDROXY (VIT D DEFICIENCY, FRACTURES): Vit D, 25-Hydroxy: 56 ng/mL (ref 30–100)

## 2014-11-01 ENCOUNTER — Encounter: Payer: Self-pay | Admitting: Physician Assistant

## 2014-11-01 MED ORDER — AZITHROMYCIN 250 MG PO TABS: ORAL_TABLET | ORAL | Status: DC

## 2014-11-10 ENCOUNTER — Other Ambulatory Visit: Payer: Self-pay | Admitting: *Deleted

## 2014-11-10 MED ORDER — PRAVASTATIN SODIUM 40 MG PO TABS: 40.0000 mg | ORAL_TABLET | Freq: Every day | ORAL | Status: DC

## 2014-11-29 ENCOUNTER — Other Ambulatory Visit: Payer: Self-pay

## 2014-11-29 MED ORDER — METFORMIN HCL ER 500 MG PO TB24: 500.0000 mg | ORAL_TABLET | Freq: Three times a day (TID) | ORAL | Status: DC

## 2014-12-02 ENCOUNTER — Encounter: Payer: Self-pay | Admitting: Cardiovascular Disease

## 2014-12-02 ENCOUNTER — Ambulatory Visit (INDEPENDENT_AMBULATORY_CARE_PROVIDER_SITE_OTHER): Payer: PPO | Admitting: Cardiovascular Disease

## 2014-12-02 VITALS — BP 118/66 | HR 82 | Ht 66.0 in | Wt 246.4 lb

## 2014-12-02 DIAGNOSIS — I471 Supraventricular tachycardia: Secondary | ICD-10-CM | POA: Diagnosis not present

## 2014-12-02 DIAGNOSIS — I10 Essential (primary) hypertension: Secondary | ICD-10-CM | POA: Diagnosis not present

## 2014-12-02 DIAGNOSIS — E785 Hyperlipidemia, unspecified: Secondary | ICD-10-CM

## 2014-12-02 NOTE — Assessment & Plan Note (Signed)
History of hyperlipidemia on pravastatin 40 mg a day with recent lipid profile performed 10/26/14 revealed a total cholesterol 142, LDL 71 and HDL of 48

## 2014-12-02 NOTE — Assessment & Plan Note (Signed)
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 Rayann Heman

## 2014-12-02 NOTE — Assessment & Plan Note (Signed)
History of hypertension blood pressure measured at 118/66. She is on diltiazem 60 mg twice a day in addition to losartan 50 mg a day. Continue current meds at current dosing

## 2014-12-02 NOTE — Patient Instructions (Signed)
Dr.Berry wants you to follow-up in: One year. You will receive a reminder letter in the mail two months in advance. If you don't receive a letter, please call our office to schedule the follow-up appointment.

## 2014-12-02 NOTE — Progress Notes (Signed)
12/02/2014 Margot Ables   1946-01-15  [id number]  Primary Physician MCKEOWN,WILLIAM DAVID, MD Primary Cardiologist: Lorretta Harp MD Renae Gloss   HPI:  Ms. Jillian Wood is a very pleasant 69 year old severely overweight married Caucasian female mother of 3 children, grandmother of 2 grandchildren is accompanied by her husband was also a patient of mine. I last saw her in the office one year ago. She was referred by Dr. Melford Aase for cardiovascular evaluation because of an episode of prolonged chest pressure. Her cardiovascular risk factor profile is notable for 50 pack years of tobacco abuse currently smoking 3-5 cigarettes a day down from one pack per day.. She has treated hypertension, hyperlipidemia and non-insulin-requiring diabetes. There is no family history of heart disease. She has never had a heart attack stroke. 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 Since I saw her a year ago she denies chest pain. She has chronic shortness of breath on home O2. A Myoview stress test and 2-D echo performed last year were unremarkable. She has developed SVT which have responded to vagal maneuvers. She has seen Dr. Rayann Heman for this in the past.   Current Outpatient Prescriptions  Medication Sig Dispense Refill  . acetaZOLAMIDE (DIAMOX) 250 MG tablet Take 250 mg by mouth 2-3 times a day    . albuterol (PROVENTIL HFA;VENTOLIN HFA) 108 (90 BASE) MCG/ACT inhaler Inhale 1-2 puffs into the lungs every 6 (six) hours as needed for wheezing or shortness of breath. 18 g 2  . aspirin 81 MG tablet Take 81 mg by mouth daily.    Marland Kitchen azithromycin (ZITHROMAX) 250 MG tablet 2 tablets by mouth today then one tablet daily for 4 days. 6 tablet 1  . budesonide-formoterol (SYMBICORT) 160-4.5 MCG/ACT inhaler 2 puffs twice a day, wash mouth afterwards 1 Inhaler 12  . Cholecalciferol (VITAMIN D PO) Take 10,000 Units by mouth daily.    . diazepam (VALIUM) 5 MG tablet Take 1 tab 3 times a day for  anxiety. 90 tablet 2  . diltiazem (CARDIZEM) 60 MG tablet TAKE 1 TABLET (60 MG TOTAL) BY MOUTH 2 (TWO) TIMES DAILY. 60 tablet 2  . DULoxetine (CYMBALTA) 30 MG capsule Take 1 capsule (30 mg total) by mouth daily. 90 capsule 0  . famotidine (PEPCID) 20 MG tablet Take 1 tablet (20 mg total) by mouth 2 (two) times daily. For acid reflux 180 tablet PRN  . furosemide (LASIX) 80 MG tablet Take 1/2 tab daily for BP and fluid. 45 tablet 0  . guaiFENesin (MUCINEX) 600 MG 12 hr tablet Take 600 mg by mouth daily.    Marland Kitchen levothyroxine (SYNTHROID, LEVOTHROID) 200 MCG tablet Take 1 tablet (200 mcg total) by mouth daily before breakfast. 90 tablet 3  . loratadine-pseudoephedrine (CLARITIN-D 24-HOUR) 10-240 MG per 24 hr tablet Take 1 tablet by mouth as needed.     Marland Kitchen losartan (COZAAR) 50 MG tablet TAKE 1 TABLET (50 MG TOTAL) BY MOUTH DAILY. 30 tablet 2  . Magnesium Chloride (MAGNESIUM DR PO) Take 1 capsule by mouth daily.     . metFORMIN (GLUCOPHAGE-XR) 500 MG 24 hr tablet Take 1 tablet (500 mg total) by mouth 4 (four) times daily - after meals and at bedtime. 360 tablet 3  . OXYGEN-HELIUM IN Inhale 3 L into the lungs.     . pravastatin (PRAVACHOL) 40 MG tablet Take 1 tablet (40 mg total) by mouth daily. 90 tablet 3  . varenicline (CHANTIX CONTINUING MONTH PAK) 1 MG tablet  Take 1/2 to 1 tablet 2 x day as directed for Smoking cessation 56 tablet 2   No current facility-administered medications for this visit.    Allergies  Allergen Reactions  . Ace Inhibitors     cough  . Inderal [Propranolol]     Hair loss    History   Social History  . Marital Status: Married    Spouse Name: N/A  . Number of Children: N/A  . Years of Education: N/A   Occupational History  . Not on file.   Social History Main Topics  . Smoking status: Current Every Day Smoker -- 0.50 packs/day for 44 years    Types: Cigarettes  . Smokeless tobacco: Never Used     Comment: smoke about 5-6 cigarette daily  . Alcohol Use: No  .  Drug Use: No  . Sexual Activity: Not on file   Other Topics Concern  . Not on file   Social History Narrative     Review of Systems: General: negative for chills, fever, night sweats or weight changes.  Cardiovascular: negative for chest pain, dyspnea on exertion, edema, orthopnea, palpitations, paroxysmal nocturnal dyspnea or shortness of breath Dermatological: negative for rash Respiratory: negative for cough or wheezing Urologic: negative for hematuria Abdominal: negative for nausea, vomiting, diarrhea, bright red blood per rectum, melena, or hematemesis Neurologic: negative for visual changes, syncope, or dizziness All other systems reviewed and are otherwise negative except as noted above.    Blood pressure 118/66, pulse 82, height 5\' 6"  (1.676 m), weight 246 lb 6.4 oz (111.766 kg).  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 Rayann Heman   Hyperlipidemia History of hyperlipidemia on pravastatin 40 mg a day with recent lipid profile performed 10/26/14 revealed a total cholesterol 142, LDL 71 and HDL of 48   Essential hypertension History of hypertension blood pressure measured at 118/66. She is on diltiazem 60 mg twice a day in addition to losartan 50 mg a day. Continue current meds at current dosing       Lorretta Harp MD [facility], [facility] 12/02/2014 2:53 PM

## 2014-12-05 ENCOUNTER — Ambulatory Visit: Payer: Medicare Other | Admitting: Internal Medicine

## 2014-12-12 ENCOUNTER — Other Ambulatory Visit: Payer: Self-pay

## 2014-12-12 MED ORDER — DILTIAZEM HCL 60 MG PO TABS: ORAL_TABLET | ORAL | Status: DC

## 2014-12-12 MED ORDER — LOSARTAN POTASSIUM 50 MG PO TABS: ORAL_TABLET | ORAL | Status: DC

## 2014-12-12 MED ORDER — METFORMIN HCL ER 500 MG PO TB24: 500.0000 mg | ORAL_TABLET | Freq: Three times a day (TID) | ORAL | Status: DC

## 2014-12-25 ENCOUNTER — Encounter: Payer: Self-pay | Admitting: Internal Medicine

## 2014-12-28 ENCOUNTER — Encounter: Payer: Self-pay | Admitting: Internal Medicine

## 2014-12-29 ENCOUNTER — Other Ambulatory Visit: Payer: Self-pay | Admitting: Internal Medicine

## 2014-12-29 MED ORDER — ACETAMINOPHEN-CODEINE #3 300-30 MG PO TABS: 1.0000 | ORAL_TABLET | Freq: Three times a day (TID) | ORAL | Status: DC | PRN

## 2015-01-10 ENCOUNTER — Other Ambulatory Visit: Payer: Self-pay | Admitting: *Deleted

## 2015-01-10 DIAGNOSIS — K2100 Gastro-esophageal reflux disease with esophagitis, without bleeding: Secondary | ICD-10-CM

## 2015-01-10 MED ORDER — FAMOTIDINE 20 MG PO TABS: 20.0000 mg | ORAL_TABLET | Freq: Two times a day (BID) | ORAL | Status: DC

## 2015-01-13 ENCOUNTER — Ambulatory Visit: Payer: PPO | Admitting: Internal Medicine

## 2015-01-18 ENCOUNTER — Ambulatory Visit (INDEPENDENT_AMBULATORY_CARE_PROVIDER_SITE_OTHER): Payer: PPO | Admitting: Internal Medicine

## 2015-01-18 ENCOUNTER — Encounter: Payer: Self-pay | Admitting: Internal Medicine

## 2015-01-18 VITALS — BP 116/62 | HR 76 | Temp 97.5°F | Resp 20 | Ht 64.25 in | Wt 238.5 lb

## 2015-01-18 DIAGNOSIS — R6889 Other general symptoms and signs: Secondary | ICD-10-CM

## 2015-01-18 DIAGNOSIS — E559 Vitamin D deficiency, unspecified: Secondary | ICD-10-CM

## 2015-01-18 DIAGNOSIS — Z79899 Other long term (current) drug therapy: Secondary | ICD-10-CM

## 2015-01-18 DIAGNOSIS — Z0001 Encounter for general adult medical examination with abnormal findings: Secondary | ICD-10-CM

## 2015-01-18 DIAGNOSIS — J449 Chronic obstructive pulmonary disease, unspecified: Secondary | ICD-10-CM

## 2015-01-18 DIAGNOSIS — Z0000 Encounter for general adult medical examination without abnormal findings: Secondary | ICD-10-CM

## 2015-01-18 DIAGNOSIS — I10 Essential (primary) hypertension: Secondary | ICD-10-CM

## 2015-01-18 DIAGNOSIS — Z9981 Dependence on supplemental oxygen: Secondary | ICD-10-CM

## 2015-01-18 DIAGNOSIS — Z1331 Encounter for screening for depression: Secondary | ICD-10-CM

## 2015-01-18 DIAGNOSIS — Z9181 History of falling: Secondary | ICD-10-CM

## 2015-01-18 DIAGNOSIS — E785 Hyperlipidemia, unspecified: Secondary | ICD-10-CM

## 2015-01-18 DIAGNOSIS — E039 Hypothyroidism, unspecified: Secondary | ICD-10-CM

## 2015-01-18 DIAGNOSIS — E1122 Type 2 diabetes mellitus with diabetic chronic kidney disease: Secondary | ICD-10-CM

## 2015-01-18 LAB — CBC WITH DIFFERENTIAL/PLATELET
Basophils Absolute: 0 10*3/uL (ref 0.0–0.1)
Basophils Relative: 0 % (ref 0–1)
EOS ABS: 0.1 10*3/uL (ref 0.0–0.7)
Eosinophils Relative: 2 % (ref 0–5)
HCT: 39.6 % (ref 36.0–46.0)
Hemoglobin: 11.8 g/dL — ABNORMAL LOW (ref 12.0–15.0)
Lymphocytes Relative: 21 % (ref 12–46)
Lymphs Abs: 1 10*3/uL (ref 0.7–4.0)
MCH: 24.5 pg — ABNORMAL LOW (ref 26.0–34.0)
MCHC: 29.8 g/dL — ABNORMAL LOW (ref 30.0–36.0)
MCV: 82.3 fL (ref 78.0–100.0)
MONO ABS: 0.6 10*3/uL (ref 0.1–1.0)
MPV: 9.6 fL (ref 8.6–12.4)
Monocytes Relative: 13 % — ABNORMAL HIGH (ref 3–12)
Neutro Abs: 2.9 10*3/uL (ref 1.7–7.7)
Neutrophils Relative %: 64 % (ref 43–77)
PLATELETS: 204 10*3/uL (ref 150–400)
RBC: 4.81 MIL/uL (ref 3.87–5.11)
RDW: 16.9 % — ABNORMAL HIGH (ref 11.5–15.5)
WBC: 4.6 10*3/uL (ref 4.0–10.5)

## 2015-01-18 LAB — HEPATIC FUNCTION PANEL
ALBUMIN: 3.3 g/dL — AB (ref 3.5–5.2)
ALT: 9 U/L (ref 0–35)
AST: 11 U/L (ref 0–37)
Alkaline Phosphatase: 85 U/L (ref 39–117)
Bilirubin, Direct: 0.1 mg/dL (ref 0.0–0.3)
Indirect Bilirubin: 0.3 mg/dL (ref 0.2–1.2)
TOTAL PROTEIN: 6.2 g/dL (ref 6.0–8.3)
Total Bilirubin: 0.4 mg/dL (ref 0.2–1.2)

## 2015-01-18 LAB — BASIC METABOLIC PANEL WITH GFR
BUN: 12 mg/dL (ref 6–23)
CO2: 37 mEq/L — ABNORMAL HIGH (ref 19–32)
Calcium: 9.2 mg/dL (ref 8.4–10.5)
Chloride: 98 mEq/L (ref 96–112)
Creat: 0.48 mg/dL — ABNORMAL LOW (ref 0.50–1.10)
GLUCOSE: 115 mg/dL — AB (ref 70–99)
Potassium: 4.4 mEq/L (ref 3.5–5.3)
Sodium: 143 mEq/L (ref 135–145)

## 2015-01-18 NOTE — Progress Notes (Signed)
Patient ID: Jillian Wood, female   DOB: 1946/05/02, 69 y.o.   MRN: [id number]  [facility] VISIT AND CPE  Assessment:   1. Essential hypertension   2. Chronic obstructive pulmonary disease, End Stage , O2 Dependent   3. Hyperlipidemia   4. Type 2 diabetes mellitus    5. Vitamin D deficiency   6. Hypothyroidism   7. Oxygen dependent   8. Morbid obesity   9. Depression screen  - Negative Screen  10. At moderate risk for fall   11. Routine general medical examination at a health care facility   12. Medication management  - CBC with Differential/Platelet - BASIC METABOLIC PANEL WITH GFR - Hepatic function panel  Plan:   During the course of the visit the patient was educated and counseled about appropriate screening and preventive services including:    Pneumococcal vaccine   Influenza vaccine  Td vaccine  Screening electrocardiogram  Bone densitometry screening  Colorectal cancer screening  Diabetes screening  Glaucoma screening  Nutrition counseling   Advanced directives: requested  Screening recommendations, referrals: Vaccinations:  Immunization History  Administered Date(s) Administered  . Influenza, High Dose Seasonal PF 08/16/2014  . Pneumococcal-Unspecified 05/16/2011  . Tdap 08/16/2008  Prevnar vaccine ordered Shingles vaccine declined Hep B vaccine not indicated  Nutrition assessed and recommended  Colonoscopy 08/16/2010 Recommended yearly ophthalmology/optometry visit for glaucoma screening and checkup Recommended yearly dental visit for hygiene and checkup Advanced directives - No - offrered forms  Conditions/risks identified: BMI: Discussed weight loss, diet, and increase physical activity.  Increase physical activity: AHA recommends 150 minutes of physical activity a week.  Medications reviewed Diabetes is not at goal, ACE/ARB therapy: No, Reason not on Ace Inhibitor/ARB therapy:  allergic  ACEi Urinary Incontinence is not an issue: discussed non pharmacology and pharmacology options.  Fall risk: low- discussed PT, home fall assessment, medications.   Subjective:    [name]  presents for Medicare Annual Wellness Visit and date of last medicare wellness visit was 12/21/2013. This very nice 69 y.o. MWF presents for  follow up with Hypertension, Hyperlipidemia, T2_DM and Vitamin D Deficiency. Also patient is for Pulmonary assessment for necessity of oxygen. Patient has severe end-stage COPD with tendency for CO2 retention for which she's on Diamox to lower her CO2. She reports home O2's range 90-93% on 3-6 lit O2 depending on her level of activity which at best includes walking slowly from 1 room to a nother and becoming dyspneic - even on O2. She reports daily dry non-productive cough. Generally she uses a cane or walker in her home and a wheel chair if she leaves home.    Patient is treated for HTN since the 1980's & BP has been controlled at home. Today's BP: 116/62 mmHg. Patient has had no complaints of any cardiac type chest pain, palpitations, dyspnea/orthopnea/PND, dizziness, claudication, or dependent edema.   Hyperlipidemia is controlled with diet & meds. Patient denies myalgias or other med SE's. Last Lipids were at goal - Chol 142; HDL 48; LDL 71; Trig 117 on 10/26/2014.   Also, the patient has history of  Morbid Obesity (BMI 540.62) and consequent T2_NIDDM predating since 2011 and has had no symptoms of reactive hypoglycemia, diabetic polys, paresthesias or visual blurring.  Last A1c was  6.1% on2/06/2015.   Further, the patient also has history of Vitamin D Deficiency and supplements vitamin D without any suspected side-effects. Last vitamin D was  56 on 10/26/2014.    Names of Other  Physician/Practitioners you currently use: 1.  [facility] here for primary care 2. Dr Renaldo Fiddler in [place], eye doctor, last visit 2015 3. No  dentist, has dentures  Patient Care Team: Unk Pinto, MD as PCP - General (Internal Medicine) Evans Lance, MD as Consulting Physician (Cardiology) Lorretta Harp, MD as Consulting Physician (Cardiology) Inda Castle, MD as Consulting Physician (Gastroenterology)  Medication Review: Medication Sig  . TYLENOL #3 300-30 MG per tablet Take 1 tablet by mouth every 8 (eight) hours as needed for moderate pain or severe pain.  Marland Kitchen acetaZOLAMIDE (DIAMOX) 250 MG tablet Take 250 mg by mouth 2-3 times a day  . albuterol  HFA / MDI inhaler Inhale 1-2 puffs into the lungs every 6 (six) hours as needed for wheezing or shortness of breath.  Marland Kitchen aspirin 81 MG tablet Take 81 mg by mouth daily.  . budesonide-formoterol (SYMBICORT) 160-4.5  inhaler 2 puffs twice a day, wash mouth afterwards  . Cholecalciferol (VITAMIN D PO) Take 10,000 Units by mouth daily.  . diazepam (VALIUM) 5 MG tablet Take 1 tab 3 times a day for anxiety.  Marland Kitchen diltiazem (CARDIZEM) 60 MG tablet TAKE 1 TABLET (60 MG TOTAL) BY MOUTH 2 (TWO) TIMES DAILY.  . DULoxetine (CYMBALTA) 30 MG capsule Take 1 capsule (30 mg total) by mouth daily.  . famotidine (PEPCID) 20 MG tablet Take 1 tablet (20 mg total) by mouth 2 (two) times daily. For acid reflux  . furosemide (LASIX) 80 MG tablet Take 1/2 tab daily for BP and fluid.  Marland Kitchen guaiFENesin (MUCINEX) 600 MG 12 hr tablet Take 600 mg by mouth daily.  Marland Kitchen levothyroxine  200 MCG tablet Take 1 tablet (200 mcg total) by mouth daily before breakfast.  . CLARITIN-D 24 10-240 MG  Take 1 tablet by mouth as needed.   Marland Kitchen losartan (COZAAR) 50 MG tablet TAKE 1 TABLET (50 MG TOTAL) BY MOUTH DAILY.  Marland Kitchen MAGNESIUM Take 1 capsule by mouth daily.   . metFORMIN -XR 500 MG 24 hr tablet Take 1 tablet (500 mg total) by mouth 4 (four) times daily - after meals and at bedtime.  . OXYGEN  Inhale 3-6  L   . pravastatin (PRAVACHOL) 40 MG tablet Take 1 tablet (40 mg total) by mouth daily.  Hendricks Limes CONTINUING MONTH PAK 1 MG  tablet Take 1/2 to 1 tablet 2 x day as directed for Smoking cessation   Current Problems (verified) Patient Active Problem List   Diagnosis Date Noted  . Tobacco abuse 10/18/2014  . Chronic diastolic HF (heart failure), with recent mild exacerbation 10/18/2014  . COPD (chronic obstructive pulmonary disease) 09/21/2014  . Oxygen dependent 09/21/2014  . Paroxysmal SVT (supraventricular tachycardia) 08/04/2014  . Hypothyroidism 02/22/2014  . Medication management 11/06/2013  . Essential hypertension   . Hyperlipidemia   . Morbid obesity   . Vitamin D deficiency   . 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 IRON DEFICIENCY 07/11/2010  . Personal history of colonic polyps 07/11/2010   Screening Tests Health Maintenance  Topic Date Due  . OPHTHALMOLOGY EXAM  02/13/1956  . ZOSTAVAX  02/12/2006  . PNA vac Low Risk Adult (2 of 2 - PCV13) 05/15/2012  . INFLUENZA VACCINE  04/17/2015  . HEMOGLOBIN A1C  04/26/2015  . FOOT EXAM  07/15/2015  . URINE MICROALBUMIN  07/15/2015  . MAMMOGRAM  10/07/2015  . TETANUS/TDAP  08/16/2018  . COLONOSCOPY  08/16/2020  . DEXA SCAN  Completed  Immunization History  Administered Date(s) Administered  . Influenza, High Dose Seasonal PF 08/16/2014  . Pneumococcal-Unspecified 05/16/2011  . Tdap 08/16/2008   Preventative care: Last colonoscopy: 08/16/2010  History reviewed: allergies, current medications, past family history, past medical history, past social history, past surgical history and problem list  Risk Factors: Tobacco History  Substance Use Topics  . Smoking status: Current Every Day Smoker -- 0.50 packs/day for 44 years    Types: Cigarettes  . Smokeless tobacco: Never Used     Comment: smoke about 5-6 cigarette daily  . Alcohol Use: No   She does smoke.  Patient alleges trying to quit. Are there smokers in your home (other than you)?  Yes Alcohol Current alcohol use: none  Caffeine Current caffeine use: coffee 3-6 cups  /day  Exercise Current exercise: none  Nutrition/Diet Current diet: in general, an "unhealthy" diet  Cardiac risk factors: advanced age (older than 75 for men, 61 for women), diabetes mellitus, dyslipidemia, hypertension, obesity (BMI >= 30 kg/m2), sedentary lifestyle and smoking/ tobacco exposure.  Depression Screen (Note: if answer to either of the following is "Yes", a more complete depression screening is indicated)   Q1: Over the past two weeks, have you felt down, depressed or hopeless? No  Q2: Over the past two weeks, have you felt little interest or pleasure in doing things? No  Have you lost interest or pleasure in daily life? No  Do you often feel hopeless? No  Do you cry easily over simple problems? No  Activities of Daily Living In your present state of health, do you have any difficulty performing the following activities?:  Driving? No Managing money?  No Feeding yourself? No Getting from bed to chair? No Climbing a flight of stairs? No Preparing food and eating?: No Bathing or showering? No Getting dressed: No Getting to the toilet? No Using the toilet:No Moving around from place to place: No In the past year have you fallen or had a near fall?:No   Are you sexually active?  No  Do you have more than one partner?  No  Vision Difficulties: No  Hearing Difficulties: No Do you often ask people to speak up or repeat themselves? No Do you experience ringing or noises in your ears? No Do you have difficulty understanding soft or whispered voices? Sometimes.  Cognition  Do you feel that you have a problem with memory?No  Do you often misplace items? No  Do you feel safe at home?  Yes  Advanced directives Does patient have a [place]? No - offered forms Does patient have a Living Will? No - offered forms  Past Medical History  Diagnosis Date  . Hypertension   . Hyperlipidemia   . Diabetes mellitus type 2 in obese   . Morbid obesity    . Vitamin D deficiency   . COPD (chronic obstructive pulmonary disease)   . Arthritis   . GERD (gastroesophageal reflux disease)   . Depression   . Thyroid disease   . OAB (overactive bladder)   . PSVT (paroxysmal supraventricular tachycardia)    Past Surgical History  Procedure Laterality Date  . Carpal tunnel release      L 1992 R 1984  . Eye surgery Bilateral     cataract  . Pubovaginal sling    . Tonsillectomy and adenoidectomy    . Transthoracic echocardiogram  01/2011    Hyperdynamic LV with EF 65-70%, Gr 1 DD, Mild Ao Stenosis - mean gradient ~  19 mmHg  . Nm myoview ltd  01/2011    Dobutamine Myoview: Negative perfusion scan for ischemia / infarct (poor image capture); Pt developed SVT with Dobutamine that reproduced her CP as it resolved with restoration of NSR.   ROS: Constitutional: Denies fever, chills, weight loss/gain, headaches, insomnia, fatigue, night sweats, and change in appetite. Eyes: Denies redness, blurred vision, diplopia, discharge, itchy, watery eyes.  ENT: Denies discharge, congestion, post nasal drip, epistaxis, sore throat, earache, hearing loss, dental pain, Tinnitus, Vertigo, Sinus pain, snoring.  Cardio: Denies chest pain, palpitations, irregular heartbeat, syncope, dyspnea, diaphoresis, orthopnea, PND, claudication, edema Respiratory: denies cough, dyspnea, DOE, pleurisy, hoarseness, laryngitis, wheezing.  Gastrointestinal: Denies dysphagia, heartburn, reflux, water brash, pain, cramps, nausea, vomiting, bloating, diarrhea, constipation, hematemesis, melena, hematochezia, jaundice, hemorrhoids Genitourinary: Denies dysuria, frequency, urgency, nocturia, hesitancy, discharge, hematuria, flank pain Breast: Breast lumps, nipple discharge, bleeding.  Musculoskeletal: Denies arthralgia, myalgia, stiffness, Jt. Swelling, pain, limp, and strain/sprain. Denies falls. Skin: Denies puritis, rash, hives, warts, acne, eczema, changing in skin lesion Neuro: No  weakness, tremor, incoordination, spasms, paresthesia, pain Psychiatric: Denies confusion, memory loss, sensory loss. Denies Depression. Endocrine: Denies change in weight, skin, hair change, nocturia, and paresthesia, diabetic polys, visual blurring, hyper / hypo glycemic episodes.  Heme/Lymph: No excessive bleeding, bruising, enlarged lymph nodes  Objective:     BP 116/62   Pulse 76  Temp 97.5 F   Resp 20  Ht 5' 4.25"   Wt 238 lb 8 oz   BMI 40.62   Patient's O2 sat = 83%at rest on 4 liters/min nasal O2 and off of oxygen  Patient's O2 sat quickly dropped to 70 with patient becoming symptomatically distressed & dyspneic.  General Appearance:  Morbidly Obese elderly and chronically ill appearing white female on nasal oxygen sitting in a dbl wide wheel chair. Personal hygiene seems compromised with tobacco halitosis and foul body odor.   Eyes: PERRLA, EOMs, conjunctiva no swelling or erythema. Sinuses: No frontal/maxillary tenderness ENT/Mouth: EACs patent / TMs  nl. Nares clear without erythema, swelling, mucoid exudates. Oral hygiene is good. No erythema, swelling, or exudate. Tongue normal, non-obstructing. Tonsils not swollen or erythematous. Hearing normal.  Neck: Supple, thyroid normal. No bruits, nodes or JVD. Respiratory: BS very distant.  Few scattered rales exacerbated with forced expiration with cough. Cardio: Heart sounds are soft with regular rate and rhythm and no murmurs, rubs or gallops. Peripheral pulses are obscured by 1 +  edema. No aortic or femoral bruits. Chest: Kyphotic with barrel configuration and increased AP diameter.  Abdomen: rotund & soft with nl bowel sounds. Nontender, no guarding, rebound, hernias, masses, or organomegaly.  Lymphatics: Non tender without lymphadenopathy.  Musculoskeletal: Full ROM with generalized decrease in muscle power, ton and bulk. Patient in wheel chair and gait not tested. Skin: Warm and dry without rashes, lesions, cyanosis,  clubbing or  ecchymosis.  Neuro: Cranial nerves intact, reflexes equal bilaterally. Normal muscle tone, no cerebellar symptoms. Sensation intact.  Pysch: Alert and oriented X 3, normal affect, limited Insight and Judgment questionable.   Cognitive Testing  Alert? Yes  Normal Appearance?Yes  Oriented to person? Yes  Place? Yes   Time? Yes  Recall of three objects?  Yes  Can perform simple calculations? Yes  Displays appropriate judgment? Yes  Can read the correct time from a watch/clock?Yes  Medicare Attestation I have personally reviewed: The patient's medical and social history Their use of alcohol, tobacco or illicit drugs Their current medications and supplements The patient's functional ability  including ADLs,fall risks, home safety risks, cognitive, and hearing and visual impairment Diet and physical activities Evidence for depression or mood disorders  The patient's weight, height, BMI, and visual acuity have been recorded in the chart.  I have made referrals, counseling, and provided education to the patient based on review of the above and I have provided the patient with a written personalized care plan for preventive services.  Over 40 minutes of exam, counseling, chart review was performed. Recommend patient have portable Oxygen and if possible a portable Oxygen Concentrator to allow her 5-6 hr car trips to visit family in [place] (before she dies).  Myrtha Tonkovich DAVID, MD   01/18/2015

## 2015-01-22 IMAGING — CR DG CHEST 2V
2 series · 2 of 2 positions shown · non-contrast
Comparison: 10/25/2009

CLINICAL DATA: COPD, smoker, shortness of breath.

EXAM:
CHEST  2 VIEW

[view not recorded (1 of 2)]
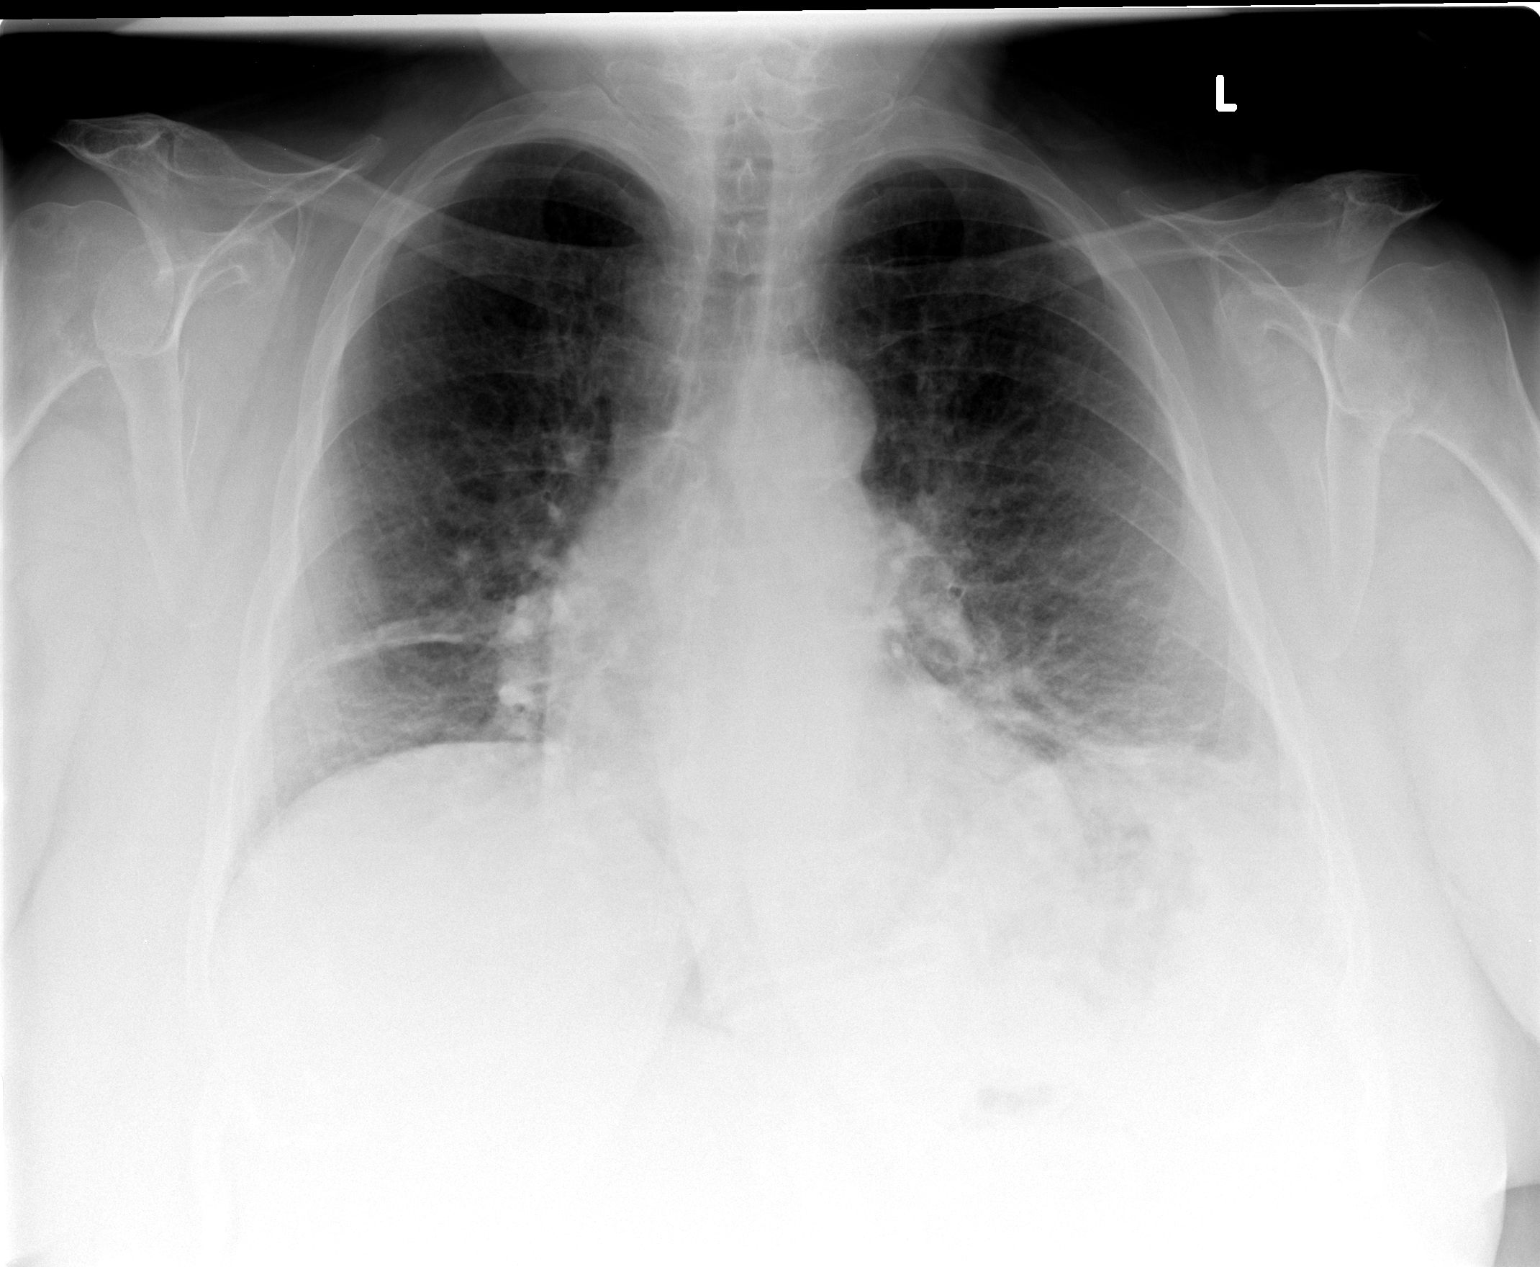

[view not recorded (2 of 2)]
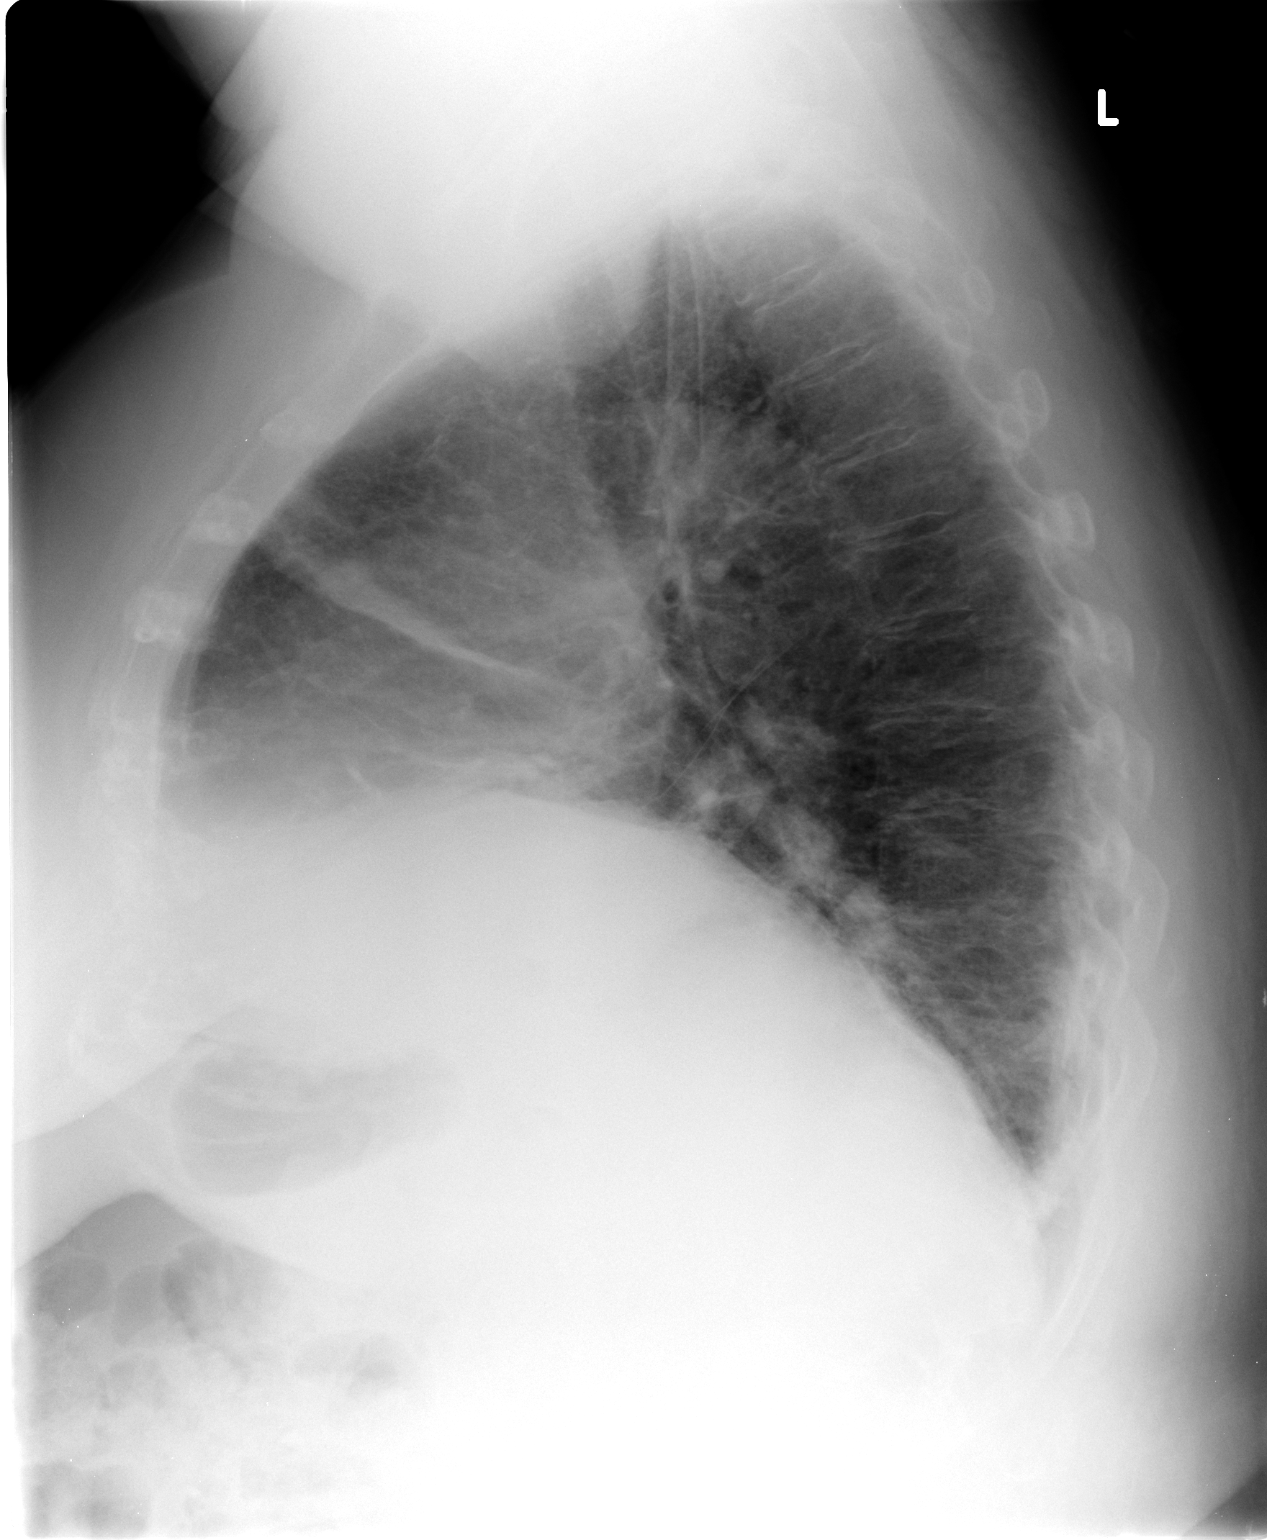

[2 of 2 positions shown; findings below may reference images not displayed]

FINDINGS: Bibasilar densities noted, slightly improved since prior study.
Findings likely reflect residual atelectasis. There is peribronchial
thickening. Heart is normal size. No effusions. No acute bony
abnormality. Heart is normal size.
IMPRESSION: Improving bibasilar opacities with residual bibasilar atelectasis.

## 2015-01-24 ENCOUNTER — Other Ambulatory Visit: Payer: Self-pay

## 2015-01-24 MED ORDER — LEVOTHYROXINE SODIUM 200 MCG PO TABS: 200.0000 ug | ORAL_TABLET | Freq: Every day | ORAL | Status: DC

## 2015-01-30 ENCOUNTER — Ambulatory Visit: Payer: Self-pay | Admitting: Internal Medicine

## 2015-02-01 ENCOUNTER — Ambulatory Visit (INDEPENDENT_AMBULATORY_CARE_PROVIDER_SITE_OTHER): Payer: PPO | Admitting: Internal Medicine

## 2015-02-01 ENCOUNTER — Encounter: Payer: Self-pay | Admitting: Internal Medicine

## 2015-02-01 VITALS — BP 116/74 | HR 80 | Temp 97.3°F | Resp 16 | Ht 64.25 in | Wt 233.0 lb

## 2015-02-01 DIAGNOSIS — Z79899 Other long term (current) drug therapy: Secondary | ICD-10-CM

## 2015-02-01 DIAGNOSIS — F32A Depression, unspecified: Secondary | ICD-10-CM

## 2015-02-01 DIAGNOSIS — E785 Hyperlipidemia, unspecified: Secondary | ICD-10-CM

## 2015-02-01 DIAGNOSIS — I10 Essential (primary) hypertension: Secondary | ICD-10-CM

## 2015-02-01 DIAGNOSIS — F329 Major depressive disorder, single episode, unspecified: Secondary | ICD-10-CM

## 2015-02-01 DIAGNOSIS — E559 Vitamin D deficiency, unspecified: Secondary | ICD-10-CM

## 2015-02-01 DIAGNOSIS — E1122 Type 2 diabetes mellitus with diabetic chronic kidney disease: Secondary | ICD-10-CM

## 2015-02-01 DIAGNOSIS — J449 Chronic obstructive pulmonary disease, unspecified: Secondary | ICD-10-CM

## 2015-02-01 MED ORDER — DULOXETINE HCL 60 MG PO CPEP: 60.0000 mg | ORAL_CAPSULE | Freq: Every day | ORAL | Status: DC

## 2015-02-01 NOTE — Progress Notes (Signed)
Assessment and Plan:  Hypertension:  -Continue medication -monitor blood pressure at home. -Continue DASH diet -Reminder to go to the ER if any CP, SOB, nausea, dizziness, severe HA, changes vision/speech, left arm numbness and tingling and jaw pain.  Cholesterol - Continue diet and exercise -Check cholesterol.   Diabetes with diabetic chronic kidney disease -Continue diet and exercise.  -Check A1C  Vitamin D Def -check level -continue medications.   Continue diet and meds as discussed. Further disposition pending results of labs. Discussed med's effects and SE's.    HPI 69 y.o. female  presents for 3 month follow up with hypertension, hyperlipidemia, diabetes and vitamin D deficiency.   Her blood pressure has been controlled at home, today their BP is BP: 116/74 mmHg.She does not workout. She denies chest pain, shortness of breath, dizziness.   She is on cholesterol medication and denies myalgias. Her cholesterol is at goal. The cholesterol was:  10/26/2014: Cholesterol, Total 142; HDL-C 48; LDL (calc) 71; Triglycerides 117   She has not been working on diet and exercise for diabetes with diabetic chronic kidney disease, she is on bASA, she is on ACE/ARB, and denies  foot ulcerations, hyperglycemia, hypoglycemia , increased appetite, nausea, paresthesia of the feet, polydipsia, polyuria, visual disturbances, vomiting and weight loss. Last A1C was: 10/26/2014: Hemoglobin-A1c 6.1*   Patient is on Vitamin D supplement. 10/26/2014: VITD 56  She recently completed a zpak for a copd exacerbation.  Breathing has improved since then.     Current Medications:  Current Outpatient Prescriptions on File Prior to Visit  Medication Sig Dispense Refill  . acetaminophen-codeine (TYLENOL #3) 300-30 MG per tablet Take 1 tablet by mouth every 8 (eight) hours as needed for moderate pain or severe pain. 60 tablet 0  . acetaZOLAMIDE (DIAMOX) 250 MG tablet Take 250 mg by mouth 2-3 times a day    .  albuterol (PROVENTIL HFA;VENTOLIN HFA) 108 (90 BASE) MCG/ACT inhaler Inhale 1-2 puffs into the lungs every 6 (six) hours as needed for wheezing or shortness of breath. 18 g 2  . aspirin 81 MG tablet Take 81 mg by mouth daily.    . budesonide-formoterol (SYMBICORT) 160-4.5 MCG/ACT inhaler 2 puffs twice a day, wash mouth afterwards 1 Inhaler 12  . Cholecalciferol (VITAMIN D PO) Take 10,000 Units by mouth daily.    . diazepam (VALIUM) 5 MG tablet Take 1 tab 3 times a day for anxiety. 90 tablet 2  . diltiazem (CARDIZEM) 60 MG tablet TAKE 1 TABLET (60 MG TOTAL) BY MOUTH 2 (TWO) TIMES DAILY. 180 tablet 3  . DULoxetine (CYMBALTA) 30 MG capsule Take 1 capsule (30 mg total) by mouth daily. 90 capsule 0  . famotidine (PEPCID) 20 MG tablet Take 1 tablet (20 mg total) by mouth 2 (two) times daily. For acid reflux 180 tablet 0  . furosemide (LASIX) 80 MG tablet Take 1/2 tab daily for BP and fluid. 45 tablet 0  . guaiFENesin (MUCINEX) 600 MG 12 hr tablet Take 600 mg by mouth daily.    Marland Kitchen levothyroxine (SYNTHROID, LEVOTHROID) 200 MCG tablet Take 1 tablet (200 mcg total) by mouth daily before breakfast. 90 tablet 1  . loratadine-pseudoephedrine (CLARITIN-D 24-HOUR) 10-240 MG per 24 hr tablet Take 1 tablet by mouth as needed.     Marland Kitchen losartan (COZAAR) 50 MG tablet TAKE 1 TABLET (50 MG TOTAL) BY MOUTH DAILY. 90 tablet 3  . Magnesium Chloride (MAGNESIUM DR PO) Take 1 capsule by mouth daily.     . metFORMIN (  GLUCOPHAGE-XR) 500 MG 24 hr tablet Take 1 tablet (500 mg total) by mouth 4 (four) times daily - after meals and at bedtime. 360 tablet 3  . OXYGEN-HELIUM IN Inhale 3 L into the lungs.     . pravastatin (PRAVACHOL) 40 MG tablet Take 1 tablet (40 mg total) by mouth daily. 90 tablet 3  . varenicline (CHANTIX CONTINUING MONTH PAK) 1 MG tablet Take 1/2 to 1 tablet 2 x day as directed for Smoking cessation 56 tablet 2   No current facility-administered medications on file prior to visit.   Medical History:  Past  Medical History  Diagnosis Date  . Hypertension   . Hyperlipidemia   . Diabetes mellitus type 2 in obese   . Morbid obesity   . Vitamin D deficiency   . COPD (chronic obstructive pulmonary disease)   . Arthritis   . GERD (gastroesophageal reflux disease)   . Depression   . Thyroid disease   . OAB (overactive bladder)   . PSVT (paroxysmal supraventricular tachycardia)    Allergies:  Allergies  Allergen Reactions  . Ace Inhibitors     cough  . Inderal [Propranolol]     Hair loss     Review of Systems:  Review of Systems  Constitutional: Negative for fever, chills and malaise/fatigue.  HENT: Negative for congestion, ear pain, sore throat and tinnitus.   Eyes: Negative.   Respiratory: Positive for cough (is at baseline), shortness of breath and wheezing. Negative for sputum production.   Cardiovascular: Negative for chest pain, palpitations and leg swelling.  Gastrointestinal: Negative for heartburn, nausea, vomiting, diarrhea, constipation, blood in stool and melena.  Genitourinary: Negative.  Negative for dysuria, urgency and frequency.  Musculoskeletal: Positive for neck pain (especially after looking at computers).  Skin: Negative.   Neurological: Positive for headaches. Negative for dizziness and sensory change.  Psychiatric/Behavioral: Negative for depression. The patient is not nervous/anxious and does not have insomnia.     Family history- Review and unchanged  Social history- Review and unchanged  Physical Exam: BP 116/74 mmHg  Pulse 80  Temp(Src) 97.3 F (36.3 C)  Resp 16  Ht 5' 4.25" (1.632 m)  Wt 233 lb (105.688 kg)  BMI 39.68 kg/m2 Wt Readings from Last 3 Encounters:  02/01/15 233 lb (105.688 kg)  01/18/15 238 lb 8 oz (108.183 kg)  12/02/14 246 lb 6.4 oz (111.766 kg)   General Appearance: Well nourished well developed, appears older than stated age, on [place] O2, in wheelchair, non-toxic appearing, in no apparent distress. Eyes: PERRLA, EOMs, conjunctiva  no swelling or erythema ENT/Mouth: Ear canals clear with no erythema, swelling, or discharge.  TMs normal bilaterally, oropharynx clear, moist, with no exudate.   Neck: Supple, thyroid normal, no JVD, no cervical adenopathy.  Respiratory: Respiratory effort normal, breath sounds distant A&P, no wheeze, rhonchi or rales noted.  No retractions, no accessory muscle usage Cardio: RRR with no RGs.  2/6 murmur on sternal border with distant S1S2, No noted edema.  Abdomen: Soft, + BS.  Non tender, no guarding, rebound, hernias, masses. Musculoskeletal: Full ROM, 5/5 strength, Normal gait Skin: Warm, dry without rashes, lesions, ecchymosis.  Neuro: Awake and oriented X 3, Cranial nerves intact. No cerebellar symptoms.  Psych: normal affect, Insight and Judgment appropriate.    FORCUCCI, Shrihaan Porzio, PA-C 4:48 PM [facility]

## 2015-02-01 NOTE — Patient Instructions (Signed)

## 2015-02-02 ENCOUNTER — Encounter: Payer: Self-pay | Admitting: Internal Medicine

## 2015-02-02 ENCOUNTER — Other Ambulatory Visit: Payer: Self-pay | Admitting: Internal Medicine

## 2015-02-02 ENCOUNTER — Ambulatory Visit (INDEPENDENT_AMBULATORY_CARE_PROVIDER_SITE_OTHER): Payer: PPO | Admitting: Internal Medicine

## 2015-02-02 VITALS — BP 134/80 | HR 60 | Temp 97.5°F | Resp 16

## 2015-02-02 DIAGNOSIS — I10 Essential (primary) hypertension: Secondary | ICD-10-CM

## 2015-02-02 DIAGNOSIS — J449 Chronic obstructive pulmonary disease, unspecified: Secondary | ICD-10-CM

## 2015-02-02 DIAGNOSIS — L918 Other hypertrophic disorders of the skin: Secondary | ICD-10-CM

## 2015-02-02 DIAGNOSIS — B079 Viral wart, unspecified: Secondary | ICD-10-CM

## 2015-02-02 DIAGNOSIS — L820 Inflamed seborrheic keratosis: Secondary | ICD-10-CM

## 2015-02-02 DIAGNOSIS — B078 Other viral warts: Secondary | ICD-10-CM

## 2015-02-02 LAB — LIPID PANEL
Cholesterol: 155 mg/dL (ref 0–200)
HDL: 61 mg/dL (ref >=46)
LDL CALC: 67 mg/dL (ref 0–99)
Total CHOL/HDL Ratio: 2.5 Ratio
Triglycerides: 137 mg/dL (ref <150)
VLDL: 27 mg/dL (ref 0–40)

## 2015-02-02 LAB — VITAMIN D 25 HYDROXY (VIT D DEFICIENCY, FRACTURES): VIT D 25 HYDROXY: 46 ng/mL (ref 30–100)

## 2015-02-02 LAB — HEMOGLOBIN A1C
Hgb A1c MFr Bld: 5.7 % — ABNORMAL HIGH (ref <5.7)
Mean Plasma Glucose: 117 mg/dL — ABNORMAL HIGH (ref <117)

## 2015-02-02 LAB — INSULIN, RANDOM: INSULIN: 9.8 u[IU]/mL (ref 2.0–19.6)

## 2015-02-02 LAB — TSH: TSH: 0.083 u[IU]/mL — AB (ref 0.350–4.500)

## 2015-02-02 LAB — MAGNESIUM: Magnesium: 1.6 mg/dL (ref 1.5–2.5)

## 2015-02-02 MED ORDER — LEVOTHYROXINE SODIUM 112 MCG PO TABS: 112.0000 ug | ORAL_TABLET | Freq: Every day | ORAL | Status: DC

## 2015-02-03 ENCOUNTER — Encounter: Payer: Self-pay | Admitting: Internal Medicine

## 2015-02-03 NOTE — Progress Notes (Signed)
Subjective:    Patient ID: Jillian Wood, female    DOB: 1945-12-21, 69 y.o.   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 Today she admits ongoing dyspnea at rest on 4-5 liters O2 and she's had no recent congestion or sputum production. Likewise she denies any CP, palpitations, PNP and she remains non-ambulatory using a wheelchair for mobility.    Medication Sig  . TYLENOL #3 Take 1 tabevery 8  hours as needed for  severe pain.  Marland Kitchen acetaZOLAMIDE (DIAMOX) 250 MG     . albuterol  HFA  inhaler Inhale 1-2 puffs  every 6hours as needed  . aspirin 81 MG tablet Take 81 mg by mouth daily.  . SYMBICORT160-4.5  inhaler Take 2 puffs twice a day  . VITAMIN D  Take 10,000 Units by mouth daily.  . diazepam  5 MG tablet Take 1 tab 3 times a day for anxiety.  Marland Kitchen diltiazem  60 MG tablet TAKE 1 TAB 2 TIMES DAILY.  . DULoxetine  60 MG  Take 1 capsule (60 mg total) by mouth daily.  . famotidine  20 MG tablet Take 1 tablet 2 times daily.   . furosemide  80 MG tablet Take 1/2 tab daily for BP and fluid.  Marland Kitchen guaiFENesin 600 MG 12 hr tablet Take 600 mg by mouth daily.  Marland Kitchen levothyroxine  112 MCG tablet Take 1 tablet  daily.  Marland Kitchen CLARITIN-D 24-HR 10-240 MG  Take 1 tablet by mouth as needed.   Marland Kitchen losartan50 MG tablet TAKE 1 TABLET (50 MG TOTAL) BY MOUTH DAILY.  . Magnesium Chloride  Take 1 capsule by mouth daily.   . metFORMIN-XR) 500 MG 24 hr tablet Take 1 tablet (500 mg total) by mouth 4 (four) times daily  . OXYGEN- Inhale 3-5 L/M  . pravastatin 40 MG tablet Take 1 tablet (40 mg total) by mouth daily.  . varenicline CONTINUING MON PAK)  Take 1/2 to 1 tablet 2 x day as directed for Smoking cessation    Allergies  Allergen Reactions  . Ace Inhibitors     cough  . Inderal [Propranolol]     Hair loss   Review of Systems 10 point systems review negative  except as above.    Objective:   Physical Exam BP 134/80     Pulse 60   Temp 97.5 F    Resp 16  Morbidly Obese with nasal O2 prongs sitting in a wheel chair  HEENT - Eac's patent. TM's Nl. EOM's full. PERRLA. NasoOroPharynx clear. Neck - supple. Nl Thyroid. Carotids 2+ & No bruits, nodes, JVD Chest - Kyphotic with increased AP diameter and BS very distant. Cor -  HS soft  With RRR w/o sig MGR. PP  Obscured by dependent edema. Abd - Soft , rotund , no tenderness. BS nl. MS- FROM w/o deformities. Muscle power, tone and bulk Nl. Gait Nl. Neuro - No obvious Cr N abnormalities. Sensory, motor and Cerebellar functions appear Nl w/o focal abnormalities. Psyche - Mental status normal & appropriate.    Procedures - After informed consent and  Aseptic prep with alcohol and then  local anesthesia with Marcaine 0.5% w/epi  1- (11301) 8 x 8 mm flat but filamentous verruca of the proximal lateral left form was excised sharply by #10 scalpel and the wound base was  deeply hyfrecated to destroy any residual lesion fragments and also for hemostasis, 2 - (11301) a similar 12 x 12 mm Lesion proximal to above was removed in similar fashion, 3- (11305) then a 4 x 34mm  irritated/inflammed seborrheic keratosis of the right chest was removed in similar fashion,  4- (11306) then a 5 x 5 mm similar lesion of the distal lateral right arm was likewise excised and wound base hyfrecated, 5- (11200) then and last an irritated 5-6 mm polypoid skin tag of the left chin  was sharply excised and the wound base was hyfrecated for hemostasis.   - All excisional wound sites were dressed with Bacitracin antibiotic ointment and sterile bandages applied. Patient was educated in wound care.    Assessment & Plan:   1. Essential hypertension   2.  COPD, End Stage   3. Verruca - Left forearm x 2  4. Seborrheic keratosis, inflamed - rt chest & Rt arm  5. Skin tag - Rt Chin   - Discussed f/u as previous

## 2015-02-07 ENCOUNTER — Other Ambulatory Visit: Payer: Self-pay

## 2015-02-09 ENCOUNTER — Other Ambulatory Visit: Payer: Self-pay | Admitting: *Deleted

## 2015-02-09 MED ORDER — PRAVASTATIN SODIUM 40 MG PO TABS: 40.0000 mg | ORAL_TABLET | Freq: Every day | ORAL | Status: DC

## 2015-02-14 ENCOUNTER — Ambulatory Visit: Payer: PPO | Admitting: Internal Medicine

## 2015-03-09 ENCOUNTER — Other Ambulatory Visit: Payer: Self-pay | Admitting: Internal Medicine

## 2015-03-13 ENCOUNTER — Other Ambulatory Visit: Payer: Self-pay

## 2015-03-27 ENCOUNTER — Other Ambulatory Visit: Payer: Self-pay | Admitting: *Deleted

## 2015-03-27 MED ORDER — FREESTYLE SYSTEM KIT: PACK | Status: DC

## 2015-03-27 MED ORDER — FREESTYLE LANCETS MISC: Status: DC

## 2015-03-27 MED ORDER — GLUCOSE BLOOD VI STRP: ORAL_STRIP | Status: DC

## 2015-04-18 ENCOUNTER — Other Ambulatory Visit: Payer: Self-pay | Admitting: Internal Medicine

## 2015-05-02 ENCOUNTER — Other Ambulatory Visit: Payer: Self-pay | Admitting: Internal Medicine

## 2015-05-04 ENCOUNTER — Ambulatory Visit (INDEPENDENT_AMBULATORY_CARE_PROVIDER_SITE_OTHER): Payer: PPO | Admitting: Internal Medicine

## 2015-05-04 ENCOUNTER — Encounter: Payer: Self-pay | Admitting: Internal Medicine

## 2015-05-04 VITALS — BP 124/62 | HR 72 | Temp 98.0°F | Resp 18 | Ht 64.25 in | Wt 243.0 lb

## 2015-05-04 DIAGNOSIS — Z79899 Other long term (current) drug therapy: Secondary | ICD-10-CM

## 2015-05-04 DIAGNOSIS — E559 Vitamin D deficiency, unspecified: Secondary | ICD-10-CM

## 2015-05-04 DIAGNOSIS — I10 Essential (primary) hypertension: Secondary | ICD-10-CM | POA: Diagnosis not present

## 2015-05-04 DIAGNOSIS — E785 Hyperlipidemia, unspecified: Secondary | ICD-10-CM

## 2015-05-04 DIAGNOSIS — E1122 Type 2 diabetes mellitus with diabetic chronic kidney disease: Secondary | ICD-10-CM | POA: Diagnosis not present

## 2015-05-04 LAB — CBC WITH DIFFERENTIAL/PLATELET
BASOS ABS: 0 10*3/uL (ref 0.0–0.1)
Basophils Relative: 0 % (ref 0–1)
EOS ABS: 0.1 10*3/uL (ref 0.0–0.7)
Eosinophils Relative: 1 % (ref 0–5)
HCT: 41.1 % (ref 36.0–46.0)
Hemoglobin: 12.8 g/dL (ref 12.0–15.0)
LYMPHS ABS: 1.7 10*3/uL (ref 0.7–4.0)
Lymphocytes Relative: 32 % (ref 12–46)
MCH: 27.5 pg (ref 26.0–34.0)
MCHC: 31.1 g/dL (ref 30.0–36.0)
MCV: 88.4 fL (ref 78.0–100.0)
MPV: 9.5 fL (ref 8.6–12.4)
Monocytes Absolute: 0.3 10*3/uL (ref 0.1–1.0)
Monocytes Relative: 6 % (ref 3–12)
NEUTROS ABS: 3.2 10*3/uL (ref 1.7–7.7)
NEUTROS PCT: 61 % (ref 43–77)
PLATELETS: 190 10*3/uL (ref 150–400)
RBC: 4.65 MIL/uL (ref 3.87–5.11)
RDW: 14.9 % (ref 11.5–15.5)
WBC: 5.3 10*3/uL (ref 4.0–10.5)

## 2015-05-04 LAB — LIPID PANEL
CHOL/HDL RATIO: 2.5 ratio (ref <=5.0)
Cholesterol: 174 mg/dL (ref 125–200)
HDL: 70 mg/dL (ref >=46)
LDL CALC: 85 mg/dL (ref <130)
Triglycerides: 97 mg/dL (ref <150)
VLDL: 19 mg/dL (ref <30)

## 2015-05-04 LAB — HEPATIC FUNCTION PANEL
ALT: 7 U/L (ref 6–29)
AST: 14 U/L (ref 10–35)
Albumin: 3.8 g/dL (ref 3.6–5.1)
Alkaline Phosphatase: 79 U/L (ref 33–130)
BILIRUBIN DIRECT: 0.1 mg/dL (ref <=0.2)
BILIRUBIN TOTAL: 0.3 mg/dL (ref 0.2–1.2)
Indirect Bilirubin: 0.2 mg/dL (ref 0.2–1.2)
Total Protein: 6.4 g/dL (ref 6.1–8.1)

## 2015-05-04 LAB — BASIC METABOLIC PANEL WITH GFR
BUN: 15 mg/dL (ref 7–25)
CHLORIDE: 100 mmol/L (ref 98–110)
CO2: 32 mmol/L — ABNORMAL HIGH (ref 20–31)
Calcium: 8.7 mg/dL (ref 8.6–10.4)
Creat: 0.51 mg/dL (ref 0.50–0.99)
GFR, Est African American: >89 mL/min (ref >=60)
GFR, Est Non African American: >89 mL/min (ref >=60)
Glucose, Bld: 106 mg/dL — ABNORMAL HIGH (ref 65–99)
Potassium: 4.3 mmol/L (ref 3.5–5.3)
SODIUM: 142 mmol/L (ref 135–146)

## 2015-05-04 LAB — MAGNESIUM: Magnesium: 1.6 mg/dL (ref 1.5–2.5)

## 2015-05-04 LAB — TSH: TSH: 4.47 u[IU]/mL (ref 0.350–4.500)

## 2015-05-04 NOTE — Patient Instructions (Signed)
Please create a salve with sugar and iodine to form a paste and place to the spot on the ankle.  If it is not healing in a month then we will see if we can get some wound care done.  Call if it gets hot red or warm or you start having some discharge.

## 2015-05-04 NOTE — Progress Notes (Signed)
Patient ID: TORIAN THOENNES, female   DOB: 01-24-1946, 69 y.o.   MRN: [id number]  Assessment and Plan:  Hypertension:  -Continue medication -monitor blood pressure at home. -Continue DASH diet -Reminder to go to the ER if any CP, SOB, nausea, dizziness, severe HA, changes vision/speech, left arm numbness and tingling and jaw pain.  Cholesterol - Continue diet and exercise -Check cholesterol.   Diabetes with diabetic chronic kidney disease -Continue diet and exercise.  -Check A1C  Vitamin D Def -check level -continue medications.   Likely arterial ulcer to right ankle -iodine sugar salve -if no better in a month than send to wound care/home wound care  Continue diet and meds as discussed. Further disposition pending results of labs. Discussed med's effects and SE's.    HPI 69 y.o. female  presents for 3 month follow up with hypertension, hyperlipidemia, diabetes and vitamin D deficiency.   Her blood pressure has been controlled at home, today their BP is BP: 124/62 mmHg.She does not workout. She denies chest pain, shortness of breath, dizziness.   She is on cholesterol medication and denies myalgias. Her cholesterol is at goal. The cholesterol was:  02/01/2015: Cholesterol 155; HDL 61; LDL Cholesterol 67; Triglycerides 137   She has been working on diet and exercise for diabetes with diabetic chronic kidney disease, she is on bASA, she is on ACE/ARB, and denies  foot ulcerations, hyperglycemia, hypoglycemia , increased appetite, nausea, paresthesia of the feet, polydipsia, polyuria, visual disturbances, vomiting and weight loss. Last A1C was: 02/01/2015: Hgb A1c MFr Bld 5.7*  Morning blood sugars are around 90-100.   Patient is on Vitamin D supplement. 02/01/2015: Vit D, 25-Hydroxy 46  She reports that she has gained 10 lbs in the past 3 months.  She feels that this is due to her change in thyroid medication recently.  She feels like her portion sizes have gotten smaller and her  exercise level is pretty much the same.    She feels like she is restless, but she feels like she is tired all the time.  She reports that she hurts so bad all the time it is preventing her from sleeping.  She reports that between the pain and also the bladder issues she is up every two hours.   She has had a painful sore on her ankles for approximately a year.  She has had several tests done and is concerned as to why it is not healing.      Current Medications:  Current Outpatient Prescriptions on File Prior to Visit  Medication Sig Dispense Refill  . acetaminophen-codeine (TYLENOL #3) 300-30 MG per tablet Take 1 tablet by mouth every 8 (eight) hours as needed for moderate pain or severe pain. 60 tablet 0  . acetaZOLAMIDE (DIAMOX) 250 MG tablet Take 250 mg by mouth 2-3 times a day    . albuterol (PROVENTIL HFA;VENTOLIN HFA) 108 (90 BASE) MCG/ACT inhaler Inhale 1-2 puffs into the lungs every 6 (six) hours as needed for wheezing or shortness of breath. 18 g 2  . aspirin 81 MG tablet Take 81 mg by mouth daily.    . budesonide-formoterol (SYMBICORT) 160-4.5 MCG/ACT inhaler 2 puffs twice a day, wash mouth afterwards 1 Inhaler 12  . Cholecalciferol (VITAMIN D PO) Take 10,000 Units by mouth daily.    . diazepam (VALIUM) 5 MG tablet Take 1 tab 3 times a day for anxiety. 90 tablet 2  . diltiazem (CARDIZEM) 60 MG tablet TAKE 1 TABLET (60 MG TOTAL) BY  MOUTH 2 (TWO) TIMES DAILY. 180 tablet 3  . DULoxetine (CYMBALTA) 60 MG capsule TAKE 1 CAPSULE (60 MG TOTAL) BY MOUTH DAILY. 90 capsule 0  . famotidine (PEPCID) 20 MG tablet TAKE 1 TABLET (20 MG TOTAL) BY MOUTH 2 (TWO) TIMES DAILY. FOR ACID REFLUX 180 tablet 0  . furosemide (LASIX) 80 MG tablet Take 1/2 tab daily for BP and fluid. 45 tablet 0  . glucose blood test strip Check blood sugar 1 time daily-DX-E11.22 100 each 4  . glucose monitoring kit (FREESTYLE) monitoring kit Check blood sugar 1 time daily-DX-E11.22 1 each 0  . guaiFENesin (MUCINEX) 600 MG  12 hr tablet Take 600 mg by mouth daily.    . Lancets (FREESTYLE) lancets Check blood sugar 1 time daily-DX-11.22 100 each 4  . levothyroxine (SYNTHROID) 112 MCG tablet Take 1 tablet (112 mcg total) by mouth daily. 30 tablet 11  . loratadine-pseudoephedrine (CLARITIN-D 24-HOUR) 10-240 MG per 24 hr tablet Take 1 tablet by mouth as needed.     Marland Kitchen losartan (COZAAR) 50 MG tablet TAKE 1 TABLET (50 MG TOTAL) BY MOUTH DAILY. 90 tablet 3  . Magnesium Chloride (MAGNESIUM DR PO) Take 1 capsule by mouth daily.     . metFORMIN (GLUCOPHAGE-XR) 500 MG 24 hr tablet Take 1 tablet (500 mg total) by mouth 4 (four) times daily - after meals and at bedtime. 360 tablet 3  . OXYGEN-HELIUM IN Inhale 3 L into the lungs.     . pravastatin (PRAVACHOL) 40 MG tablet Take 1 tablet (40 mg total) by mouth daily. 90 tablet 1  . varenicline (CHANTIX CONTINUING MONTH PAK) 1 MG tablet Take 1/2 to 1 tablet 2 x day as directed for Smoking cessation 56 tablet 2   No current facility-administered medications on file prior to visit.   Medical History:  Past Medical History  Diagnosis Date  . Hypertension   . Hyperlipidemia   . Diabetes mellitus type 2 in obese   . Morbid obesity   . Vitamin D deficiency   . COPD (chronic obstructive pulmonary disease)   . Arthritis   . GERD (gastroesophageal reflux disease)   . Depression   . Thyroid disease   . OAB (overactive bladder)   . PSVT (paroxysmal supraventricular tachycardia)    Allergies:  Allergies  Allergen Reactions  . Ace Inhibitors     cough  . Inderal [Propranolol]     Hair loss     Review of Systems:  ROS  Family history- Review and unchanged  Social history- Review and unchanged  Physical Exam: BP 124/62 mmHg  Pulse 72  Temp(Src) 98 F (36.7 C) (Temporal)  Resp 18  Ht 5' 4.25" (1.632 m)  Wt 243 lb (110.224 kg)  BMI 41.38 kg/m2  SpO2 92% Wt Readings from Last 3 Encounters:  05/04/15 243 lb (110.224 kg)  02/01/15 233 lb (105.688 kg)  01/18/15 238  lb 8 oz (108.183 kg)   General Appearance: Well nourished well developed, non-toxic appearing, in no apparent distress. Eyes: PERRLA, EOMs, conjunctiva no swelling or erythema ENT/Mouth: Ear canals clear with no erythema, swelling, or discharge.  TMs normal bilaterally, oropharynx clear, moist, with no exudate.   Neck: Supple, thyroid normal, no JVD, no cervical adenopathy.  Respiratory: Respiratory effort normal, breath sounds clear A&P, no wheeze, rhonchi or rales noted.  No retractions, no accessory muscle usage Cardio: RRR with no MRGs. No noted edema.  Abdomen: Soft, + BS.  Non tender, no guarding, rebound, hernias, masses. Musculoskeletal: Full ROM,  5/5 strength, Non ambulatory gait Skin: Warm, dry without rashes, lesions, ecchymosis. Legs with discoloration from the ankles down with a small 1 cm ulcer without drainage or discharge or palpable warmth on the right lateral malleolus.  TTP.   Neuro: Awake and oriented X 3, Cranial nerves intact. No cerebellar symptoms.  Psych: normal affect, Insight and Judgment appropriate.    Starlyn Skeans, PA-C 3:46 PM [facility]

## 2015-05-05 LAB — VITAMIN D 25 HYDROXY (VIT D DEFICIENCY, FRACTURES): VIT D 25 HYDROXY: 44 ng/mL (ref 30–100)

## 2015-05-05 LAB — INSULIN, RANDOM: INSULIN: 5.6 u[IU]/mL (ref 2.0–19.6)

## 2015-05-05 LAB — HEMOGLOBIN A1C
HEMOGLOBIN A1C: 5.8 % — AB (ref <5.7)
MEAN PLASMA GLUCOSE: 120 mg/dL — AB (ref <117)

## 2015-05-08 ENCOUNTER — Other Ambulatory Visit: Payer: Self-pay | Admitting: Internal Medicine

## 2015-05-11 ENCOUNTER — Encounter: Payer: Self-pay | Admitting: Internal Medicine

## 2015-05-16 ENCOUNTER — Other Ambulatory Visit: Payer: Self-pay

## 2015-05-16 DIAGNOSIS — Z1231 Encounter for screening mammogram for malignant neoplasm of breast: Secondary | ICD-10-CM

## 2015-05-30 ENCOUNTER — Encounter: Payer: Self-pay | Admitting: Internal Medicine

## 2015-06-07 ENCOUNTER — Ambulatory Visit (HOSPITAL_COMMUNITY)
Admission: RE | Admit: 2015-06-07 | Discharge: 2015-06-07 | Disposition: A | Discharge status: Home / Self Care | Payer: PPO | Source: Ambulatory Visit | Attending: Physician Assistant | Admitting: Physician Assistant

## 2015-06-07 ENCOUNTER — Encounter: Payer: Self-pay | Admitting: Physician Assistant

## 2015-06-07 ENCOUNTER — Ambulatory Visit (INDEPENDENT_AMBULATORY_CARE_PROVIDER_SITE_OTHER): Payer: PPO | Admitting: Physician Assistant

## 2015-06-07 VITALS — BP 126/70 | HR 60 | Resp 98

## 2015-06-07 DIAGNOSIS — M7731 Calcaneal spur, right foot: Secondary | ICD-10-CM | POA: Insufficient documentation

## 2015-06-07 DIAGNOSIS — N189 Chronic kidney disease, unspecified: Secondary | ICD-10-CM | POA: Diagnosis not present

## 2015-06-07 DIAGNOSIS — M25871 Other specified joint disorders, right ankle and foot: Secondary | ICD-10-CM | POA: Insufficient documentation

## 2015-06-07 DIAGNOSIS — L97319 Non-pressure chronic ulcer of right ankle with unspecified severity: Secondary | ICD-10-CM

## 2015-06-07 DIAGNOSIS — Z720 Tobacco use: Secondary | ICD-10-CM | POA: Diagnosis not present

## 2015-06-07 DIAGNOSIS — E1122 Type 2 diabetes mellitus with diabetic chronic kidney disease: Secondary | ICD-10-CM

## 2015-06-07 DIAGNOSIS — M25571 Pain in right ankle and joints of right foot: Secondary | ICD-10-CM | POA: Diagnosis present

## 2015-06-07 DIAGNOSIS — M25471 Effusion, right ankle: Secondary | ICD-10-CM | POA: Diagnosis not present

## 2015-06-07 DIAGNOSIS — F17200 Nicotine dependence, unspecified, uncomplicated: Secondary | ICD-10-CM

## 2015-06-07 MED ORDER — ACETAMINOPHEN-CODEINE #3 300-30 MG PO TABS: 1.0000 | ORAL_TABLET | Freq: Three times a day (TID) | ORAL | Status: DC | PRN

## 2015-06-07 MED ORDER — DOXYCYCLINE HYCLATE 100 MG PO TABS: 100.0000 mg | ORAL_TABLET | Freq: Two times a day (BID) | ORAL | Status: DC

## 2015-06-07 NOTE — Progress Notes (Signed)
Subjective:    Patient ID: Jillian Wood, female    DOB: Jun 11, 1946, 69 y.o.   MRN: [id number]  HPI 69 y.o. smoking, obese, WF with history of COPD O2 dependent, DM with CKD, diastolic HF, in wheel chair presents with right leg ulcer and infection. She states that she has had a scab on her right lateral mallelous x 1 year, worse over 2-3 days ago. It is warm, tender, red, draining pus. She has been using tylenol #3 for pain and warm compresses. She denies fever, chills. Had normal xray of right ankle 09/23/2014. She follows up with Dr. Paulla Dolly on Friday.   Blood pressure 126/70, pulse 60, resp. rate 98.   Past Medical History  Diagnosis Date  . Hypertension   . Hyperlipidemia   . Diabetes mellitus type 2 in obese   . Morbid obesity   . Vitamin D deficiency   . COPD (chronic obstructive pulmonary disease)   . Arthritis   . GERD (gastroesophageal reflux disease)   . Depression   . Thyroid disease   . OAB (overactive bladder)   . PSVT (paroxysmal supraventricular tachycardia)    Current Outpatient Prescriptions on File Prior to Visit  Medication Sig Dispense Refill  . acetaZOLAMIDE (DIAMOX) 250 MG tablet Take 250 mg by mouth 2-3 times a day    . albuterol (PROVENTIL HFA;VENTOLIN HFA) 108 (90 BASE) MCG/ACT inhaler Inhale 1-2 puffs into the lungs every 6 (six) hours as needed for wheezing or shortness of breath. 18 g 2  . aspirin 81 MG tablet Take 81 mg by mouth daily.    . budesonide-formoterol (SYMBICORT) 160-4.5 MCG/ACT inhaler 2 puffs twice a day, wash mouth afterwards 1 Inhaler 12  . CHANTIX CONTINUING MONTH PAK 1 MG tablet TAKE 1/2 TO 1 TABLET 2 X A DAY AS DIRECTED FOR SMOKING CESSATION 56 tablet 2  . Cholecalciferol (VITAMIN D PO) Take 10,000 Units by mouth daily.    . diazepam (VALIUM) 5 MG tablet Take 1 tab 3 times a day for anxiety. 90 tablet 2  . diltiazem (CARDIZEM) 60 MG tablet TAKE 1 TABLET (60 MG TOTAL) BY MOUTH 2 (TWO) TIMES DAILY. 180 tablet 3  . DULoxetine (CYMBALTA)  60 MG capsule TAKE 1 CAPSULE (60 MG TOTAL) BY MOUTH DAILY. 90 capsule 0  . famotidine (PEPCID) 20 MG tablet TAKE 1 TABLET (20 MG TOTAL) BY MOUTH 2 (TWO) TIMES DAILY. FOR ACID REFLUX 180 tablet 0  . furosemide (LASIX) 80 MG tablet Take 1/2 tab daily for BP and fluid. 45 tablet 0  . glucose blood test strip Check blood sugar 1 time daily-DX-E11.22 100 each 4  . glucose monitoring kit (FREESTYLE) monitoring kit Check blood sugar 1 time daily-DX-E11.22 1 each 0  . guaiFENesin (MUCINEX) 600 MG 12 hr tablet Take 600 mg by mouth daily.    . Lancets (FREESTYLE) lancets Check blood sugar 1 time daily-DX-11.22 100 each 4  . levothyroxine (SYNTHROID) 112 MCG tablet Take 1 tablet (112 mcg total) by mouth daily. 30 tablet 11  . loratadine-pseudoephedrine (CLARITIN-D 24-HOUR) 10-240 MG per 24 hr tablet Take 1 tablet by mouth as needed.     Marland Kitchen losartan (COZAAR) 50 MG tablet TAKE 1 TABLET (50 MG TOTAL) BY MOUTH DAILY. 90 tablet 3  . Magnesium Chloride (MAGNESIUM DR PO) Take 1 capsule by mouth daily.     . metFORMIN (GLUCOPHAGE-XR) 500 MG 24 hr tablet Take 1 tablet (500 mg total) by mouth 4 (four) times daily - after meals and at  bedtime. 360 tablet 3  . OXYGEN-HELIUM IN Inhale 3 L into the lungs.     . pravastatin (PRAVACHOL) 40 MG tablet Take 1 tablet (40 mg total) by mouth daily. 90 tablet 1   No current facility-administered medications on file prior to visit.    Review of Systems  Constitutional: Negative for fever, chills, activity change and appetite change.  HENT: Negative for congestion, ear pain, sore throat and tinnitus.   Eyes: Negative.   Respiratory: Positive for cough (is at baseline) and shortness of breath. Negative for wheezing.   Cardiovascular: Negative for chest pain, palpitations and leg swelling.  Gastrointestinal: Negative for nausea, vomiting, abdominal pain, diarrhea, constipation and blood in stool.  Genitourinary: Negative.  Negative for dysuria, urgency and frequency.   Musculoskeletal: Positive for back pain, arthralgias, gait problem and neck pain (especially after looking at computers). Negative for myalgias, joint swelling and neck stiffness.  Skin: Positive for wound (right ankle).  Neurological: Negative for dizziness and headaches.  Psychiatric/Behavioral: The patient is not nervous/anxious.        Objective:   Physical Exam  Constitutional: She is oriented to person, place, and time. No distress.  obese  Neck: Normal range of motion. Neck supple.  Cardiovascular: Normal rate.  Exam reveals decreased pulses.   Murmur heard.  Systolic murmur is present  Pulses:      Dorsalis pedis pulses are 1+ on the right side, and 1+ on the left side.       Posterior tibial pulses are 0 on the right side, and 0 on the left side.  Pulmonary/Chest: Effort normal. No accessory muscle usage. She has decreased breath sounds.  [place] 3L O2  Abdominal: Soft. There is no tenderness.  Musculoskeletal: Normal range of motion. She exhibits edema.  In wheelchair  Lymphadenopathy:    She has no cervical adenopathy.  Neurological: She is alert and oriented to person, place, and time. No cranial nerve deficit.  Skin: Skin is warm and dry. She is not diaphoretic.  Right malleolus with 3x4 cm white eschar with surrounding 2 cm of erythema, warmth, and swelling. She has distal dependent rubor with non palpable DP and barely palpable TP.        Assessment & Plan:  DM/pressure ulcer- get Xray rule out bone involvement, get ABI to access PAD, add vitamin C/zinc, stop smoking she is on chantix, doxycycline 100 mg BID, suggest wound center but patient declines at this time, has follow up podiatry and will follow up here next week for evaluation, wound management discussed with son/patient. Marland Kitchen

## 2015-06-07 NOTE — Patient Instructions (Signed)
Skin Ulcer  A skin ulcer is an open sore that can be shallow or deep. Skin ulcers sometimes become infected and are difficult to treat. It may be 1 month or longer before real healing progress is made.  CAUSES    Injury.   Problems with the veins or arteries.   Diabetes.   Insect bites.   Bedsores.   Inflammatory conditions.  SYMPTOMS    Pain, redness, swelling, and tenderness around the ulcer.   Fever.   Bleeding from the ulcer.   Yellow or clear fluid coming from the ulcer.  DIAGNOSIS   There are many types of skin ulcers. Any open sores will be examined. Certain tests will be done to determine the kind of ulcer you have. The right treatment depends on the type of ulcer you have.  TREATMENT   Treatment is a long-term challenge. It may include:   Wearing an elastic wrap, compression stockings, or gel cast over the ulcer area.   Taking antibiotic medicines or putting antibiotic creams on the affected area if there is an infection.  HOME CARE INSTRUCTIONS   Put on your bandages (dressings), wraps, or casts over the ulcer as directed by your caregiver.   Change all dressings as directed by your caregiver.   Take all medicines as directed by your caregiver.   Keep the affected area clean and dry.   Avoid injuries to the affected area.   Eat a well-balanced, healthy diet that includes plenty of fruit and vegetables.   If you smoke, consider quitting or decreasing the amount of cigarettes you smoke.   Once the ulcer heals, get regular exercise as directed by your caregiver.   Work with your caregiver to make sure your blood pressure, cholesterol, and diabetes are well-controlled.   Keep your skin moisturized. Dry skin can crack and lead to skin ulcers.  SEEK IMMEDIATE MEDICAL CARE IF:    Your pain gets worse.   You have swelling, redness, or fluids around the ulcer.   You have chills.   You have a fever.  MAKE SURE YOU:    Understand these instructions.   Will watch your condition.   Will get  help right away if you are not doing well or get worse.  Document Released: 10/10/2004 Document Revised: 11/25/2011 Document Reviewed: 04/19/2011  [employer] Patient Information 2015 [employer]. This information is not intended to replace advice given to you by your health care Zackarie Chason. Make sure you discuss any questions you have with your health care Byan Poplaski.

## 2015-06-09 ENCOUNTER — Telehealth: Payer: Self-pay | Admitting: *Deleted

## 2015-06-09 ENCOUNTER — Ambulatory Visit (INDEPENDENT_AMBULATORY_CARE_PROVIDER_SITE_OTHER): Payer: PPO | Admitting: Podiatry

## 2015-06-09 ENCOUNTER — Encounter: Payer: Self-pay | Admitting: Podiatry

## 2015-06-09 VITALS — BP 121/73 | HR 77 | Resp 16

## 2015-06-09 DIAGNOSIS — B351 Tinea unguium: Secondary | ICD-10-CM

## 2015-06-09 DIAGNOSIS — M79676 Pain in unspecified toe(s): Secondary | ICD-10-CM

## 2015-06-09 DIAGNOSIS — L97311 Non-pressure chronic ulcer of right ankle limited to breakdown of skin: Secondary | ICD-10-CM

## 2015-06-09 MED ORDER — CIPROFLOXACIN HCL 250 MG PO TABS: 250.0000 mg | ORAL_TABLET | Freq: Two times a day (BID) | ORAL | Status: DC

## 2015-06-09 NOTE — Telephone Encounter (Signed)
Patient called and states she has burning, pressure and frequent urination.  OK to send in an RX for Cipro per Dr Albina Billet.

## 2015-06-09 NOTE — Progress Notes (Signed)
   Subjective:    Patient ID: Jillian Wood, female    DOB: 28-Jan-1946, 69 y.o.   MRN: [id number]  HPI Comments: "I have a spot on my ankle and the toenails are thick"  Patient presents with: Foot Ulcer: Lateral ankle right - open wound for 4 days, draining, red and swollen, saw PCP on Wed., Rx'd Doxycycline, also been using neosporin, sugar and iodine paste, hot compresses. Nail Problem: Requesting nail care - thick, discolored nails-would like them trimmed       Review of Systems  Genitourinary: Positive for urgency and difficulty urinating.  Musculoskeletal: Positive for back pain and gait problem.  All other systems reviewed and are negative.      Objective:   Physical Exam        Assessment & Plan:

## 2015-06-11 NOTE — Progress Notes (Signed)
Subjective:     Patient ID: Jillian Wood, female   DOB: 13-Aug-1946, 69 y.o.   MRN: [id number]  HPI patient presents an extremely poor health with nail disease that her doctor wanted me to look at 1-5 both feet and states she's going to get vascular evaluation for a small ulcer on the right lateral ankle that's been present and that is not draining currently but has not healed. She is due for testing in the next several days   Review of Systems  All other systems reviewed and are negative.      Objective:   Physical Exam  Neurological: She is alert.  Skin: Skin is warm and dry.  Nursing note and vitals reviewed.  patient is noted to have significant diminishment of pulses both DP PT bilateral and has diminished hair growth and skin structure. There is redness to her feet right over left and on the lateral side of the right foot there is a lesion that is not draining but is very painful when pressed. Patient has delayed digital perfusion bilateral but does not have any indications currently of active drainage or indications of infection. 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 Debridement of nails accomplished today with no iatrogenic bleeding and instructed in the importance of going to her vascular evaluation because I do believe this is why she has this tissue breakdown on the lateral side of the right foot. Gave strict instructions if any systemic signs of infection were to occur patient needs to go straight to the emergency room this patient also was told that there is a chance of  amputation at one point

## 2015-06-15 ENCOUNTER — Ambulatory Visit: Payer: Self-pay | Admitting: Physician Assistant

## 2015-06-15 ENCOUNTER — Encounter: Payer: Self-pay | Admitting: Internal Medicine

## 2015-06-15 ENCOUNTER — Encounter: Payer: Self-pay | Admitting: Physician Assistant

## 2015-06-15 ENCOUNTER — Ambulatory Visit (INDEPENDENT_AMBULATORY_CARE_PROVIDER_SITE_OTHER): Payer: PPO | Admitting: Physician Assistant

## 2015-06-15 VITALS — BP 122/70 | HR 60 | Temp 97.7°F | Resp 14 | Ht 66.0 in | Wt 241.0 lb

## 2015-06-15 DIAGNOSIS — E1122 Type 2 diabetes mellitus with diabetic chronic kidney disease: Secondary | ICD-10-CM

## 2015-06-15 DIAGNOSIS — L97319 Non-pressure chronic ulcer of right ankle with unspecified severity: Secondary | ICD-10-CM

## 2015-06-15 DIAGNOSIS — N189 Chronic kidney disease, unspecified: Secondary | ICD-10-CM

## 2015-06-15 DIAGNOSIS — F17200 Nicotine dependence, unspecified, uncomplicated: Secondary | ICD-10-CM

## 2015-06-15 DIAGNOSIS — Z720 Tobacco use: Secondary | ICD-10-CM | POA: Diagnosis not present

## 2015-06-15 NOTE — Progress Notes (Signed)
Assessment and Plan: 1. Type 2 diabetes mellitus with diabetic chronic kidney disease Discussed disease process related to her skin ulcer  2. Tobacco use disorder Advised to quit smoking  3. Ankle ulcer, right, with unspecified severity Due to poor healing and likely PAD, will refer to wound center for possible debridement, will get ABI and pending results may need referral to vascular surgeon, finish doxycycline, try iodine sugar solution, no pressure on it.    HPI 69 y.o. smoking, obese, WF with history of COPD O2 dependent, DM with CKD, diastolic HF, in wheel chair presents for follow up on right leg ulcer and infection. Patient was given doxycycline, instructed not to smoke and start on vitamin C/zinc last visit. Patient is suppose to be set up for ABI to evaluate for PAD which is likely. Had normal Xray.   Past Medical History  Diagnosis Date  . Hypertension   . Hyperlipidemia   . Diabetes mellitus type 2 in obese   . Morbid obesity   . Vitamin D deficiency   . COPD (chronic obstructive pulmonary disease)   . Arthritis   . GERD (gastroesophageal reflux disease)   . Depression   . Thyroid disease   . OAB (overactive bladder)   . PSVT (paroxysmal supraventricular tachycardia)      Allergies  Allergen Reactions  . Ace Inhibitors     cough  . Inderal [Propranolol]     Hair loss      Current Outpatient Prescriptions on File Prior to Visit  Medication Sig Dispense Refill  . acetaminophen-codeine (TYLENOL #3) 300-30 MG per tablet Take 1 tablet by mouth every 8 (eight) hours as needed for moderate pain or severe pain. 90 tablet 0  . acetaZOLAMIDE (DIAMOX) 250 MG tablet Take 250 mg by mouth 2-3 times a day    . albuterol (PROVENTIL HFA;VENTOLIN HFA) 108 (90 BASE) MCG/ACT inhaler Inhale 1-2 puffs into the lungs every 6 (six) hours as needed for wheezing or shortness of breath. 18 g 2  . aspirin 81 MG tablet Take 81 mg by mouth daily.    . budesonide-formoterol (SYMBICORT)  160-4.5 MCG/ACT inhaler 2 puffs twice a day, wash mouth afterwards 1 Inhaler 12  . CHANTIX CONTINUING MONTH PAK 1 MG tablet TAKE 1/2 TO 1 TABLET 2 X A DAY AS DIRECTED FOR SMOKING CESSATION 56 tablet 2  . Cholecalciferol (VITAMIN D PO) Take 10,000 Units by mouth daily.    . diazepam (VALIUM) 5 MG tablet Take 1 tab 3 times a day for anxiety. 90 tablet 2  . diltiazem (CARDIZEM) 60 MG tablet TAKE 1 TABLET (60 MG TOTAL) BY MOUTH 2 (TWO) TIMES DAILY. 180 tablet 3  . doxycycline (VIBRA-TABS) 100 MG tablet Take 1 tablet (100 mg total) by mouth 2 (two) times daily. 20 tablet 0  . DULoxetine (CYMBALTA) 60 MG capsule TAKE 1 CAPSULE (60 MG TOTAL) BY MOUTH DAILY. 90 capsule 0  . famotidine (PEPCID) 20 MG tablet TAKE 1 TABLET (20 MG TOTAL) BY MOUTH 2 (TWO) TIMES DAILY. FOR ACID REFLUX 180 tablet 0  . furosemide (LASIX) 80 MG tablet Take 1/2 tab daily for BP and fluid. 45 tablet 0  . glucose blood test strip Check blood sugar 1 time daily-DX-E11.22 100 each 4  . glucose monitoring kit (FREESTYLE) monitoring kit Check blood sugar 1 time daily-DX-E11.22 1 each 0  . guaiFENesin (MUCINEX) 600 MG 12 hr tablet Take 600 mg by mouth daily.    . Lancets (FREESTYLE) lancets Check blood sugar 1 time  daily-DX-11.22 100 each 4  . levothyroxine (SYNTHROID) 112 MCG tablet Take 1 tablet (112 mcg total) by mouth daily. 30 tablet 11  . loratadine-pseudoephedrine (CLARITIN-D 24-HOUR) 10-240 MG per 24 hr tablet Take 1 tablet by mouth as needed.     Marland Kitchen losartan (COZAAR) 50 MG tablet TAKE 1 TABLET (50 MG TOTAL) BY MOUTH DAILY. 90 tablet 3  . Magnesium Chloride (MAGNESIUM DR PO) Take 1 capsule by mouth daily.     . metFORMIN (GLUCOPHAGE-XR) 500 MG 24 hr tablet Take 1 tablet (500 mg total) by mouth 4 (four) times daily - after meals and at bedtime. 360 tablet 3  . OXYGEN-HELIUM IN Inhale 3 L into the lungs.     . pravastatin (PRAVACHOL) 40 MG tablet Take 1 tablet (40 mg total) by mouth daily. 90 tablet 1   No current  facility-administered medications on file prior to visit.    ROS: all negative except above.   Physical Exam: Filed Weights   06/15/15 1105  Weight: 241 lb (109.317 kg)   BP 122/70 mmHg  Pulse 60  Temp(Src) 97.7 F (36.5 C) (Temporal)  Resp 14  Ht $R'5\' 6"'cR$  (1.676 m)  Wt 241 lb (109.317 kg)  BMI 38.92 kg/m2  SpO2 96% General Appearance: Obese WF, in no apparent distress.  Eyes: PERRLA, EOMs.  Sinuses: No Frontal/maxillary tenderness  ENT/Mouth: Ext aud canals clear, normal light reflex with TMs without erythema, bulging. Post pharynx without erythema, swelling, exudate.  Respiratory: [place] in place, on 3 L , decreased breath sounds, no rales, rhonchi, wheezing.  Cardio: RRR, holosystolic murmur RSB, rubs or gallops. Peripheral pulses decreased bilaterally with dependent rubor, decrease cap refill, decreased distal hair.  Abdomen: Soft, obese, with bowl sounds. Nontender, no guarding, rebound.  Lymphatics: Non tender without lymphadenopathy.  Musculoskeletal: Full ROM all peripheral extremities, 4/5 strength, and in wheel chair  Skin: right lateral malleolus with 2x3cm white eschar, painful when pressed, surrounding erythema, no warmth,swelling or exudate.  Neuro: Cranial nerves intact, reflexes equal bilaterally. Normal muscle tone, no cerebellar symptoms.  Pysch: Awake and oriented X 3, normal affect, Insight and Judgment appropriate.     [name], PA-C 11:40 AM [facility]

## 2015-06-21 ENCOUNTER — Ambulatory Visit (HOSPITAL_COMMUNITY): Payer: PPO

## 2015-06-23 ENCOUNTER — Ambulatory Visit: Payer: PPO

## 2015-06-29 ENCOUNTER — Encounter: Payer: Self-pay | Admitting: Physician Assistant

## 2015-06-29 ENCOUNTER — Ambulatory Visit (HOSPITAL_COMMUNITY)
Admission: RE | Admit: 2015-06-29 | Discharge: 2015-06-29 | Disposition: A | Discharge status: Home / Self Care | Payer: PPO | Source: Ambulatory Visit | Attending: Physician Assistant | Admitting: Physician Assistant

## 2015-06-29 DIAGNOSIS — I739 Peripheral vascular disease, unspecified: Secondary | ICD-10-CM | POA: Diagnosis not present

## 2015-06-29 DIAGNOSIS — E119 Type 2 diabetes mellitus without complications: Secondary | ICD-10-CM | POA: Insufficient documentation

## 2015-06-29 DIAGNOSIS — L97319 Non-pressure chronic ulcer of right ankle with unspecified severity: Secondary | ICD-10-CM | POA: Diagnosis not present

## 2015-06-29 DIAGNOSIS — E785 Hyperlipidemia, unspecified: Secondary | ICD-10-CM | POA: Insufficient documentation

## 2015-06-29 DIAGNOSIS — F17200 Nicotine dependence, unspecified, uncomplicated: Secondary | ICD-10-CM | POA: Diagnosis not present

## 2015-06-29 DIAGNOSIS — Z6839 Body mass index (BMI) 39.0-39.9, adult: Secondary | ICD-10-CM | POA: Insufficient documentation

## 2015-06-29 DIAGNOSIS — I10 Essential (primary) hypertension: Secondary | ICD-10-CM | POA: Diagnosis not present

## 2015-06-29 DIAGNOSIS — E1122 Type 2 diabetes mellitus with diabetic chronic kidney disease: Secondary | ICD-10-CM | POA: Diagnosis not present

## 2015-06-29 NOTE — Progress Notes (Signed)
VASCULAR LAB PRELIMINARY  ARTERIAL  ABI completed:    RIGHT    LEFT    PRESSURE WAVEFORM  PRESSURE WAVEFORM  BRACHIAL 104 Triphasic BRACHIAL 133 Triphasic  DP 110 Monophasic DP 93 Monophasic  AT   AT    PT 129 Monophasic PT 125 Monophasic  PER   PER    GREAT TOE Noncompressible NA GREAT TOE 88 NA    RIGHT LEFT  ABI 0.97 0.94  TBI Noncompressible 0.66   Bilateral ABIs are within normal limits. The right great toe is noncompressible, the left TBI is abnormal.  06/29/2015 12:07 PM Maudry Mayhew, RVT, RDCS, RDMS

## 2015-07-10 ENCOUNTER — Encounter (HOSPITAL_BASED_OUTPATIENT_CLINIC_OR_DEPARTMENT_OTHER): Payer: PPO | Attending: Plastic Surgery

## 2015-07-10 DIAGNOSIS — Z9981 Dependence on supplemental oxygen: Secondary | ICD-10-CM | POA: Diagnosis not present

## 2015-07-10 DIAGNOSIS — I471 Supraventricular tachycardia: Secondary | ICD-10-CM | POA: Diagnosis not present

## 2015-07-10 DIAGNOSIS — F17200 Nicotine dependence, unspecified, uncomplicated: Secondary | ICD-10-CM | POA: Insufficient documentation

## 2015-07-10 DIAGNOSIS — F418 Other specified anxiety disorders: Secondary | ICD-10-CM | POA: Insufficient documentation

## 2015-07-10 DIAGNOSIS — L97311 Non-pressure chronic ulcer of right ankle limited to breakdown of skin: Secondary | ICD-10-CM | POA: Diagnosis not present

## 2015-07-10 DIAGNOSIS — J449 Chronic obstructive pulmonary disease, unspecified: Secondary | ICD-10-CM | POA: Diagnosis not present

## 2015-07-10 DIAGNOSIS — E11622 Type 2 diabetes mellitus with other skin ulcer: Secondary | ICD-10-CM | POA: Insufficient documentation

## 2015-07-10 DIAGNOSIS — I10 Essential (primary) hypertension: Secondary | ICD-10-CM | POA: Diagnosis not present

## 2015-07-10 DIAGNOSIS — M1990 Unspecified osteoarthritis, unspecified site: Secondary | ICD-10-CM | POA: Diagnosis not present

## 2015-07-10 DIAGNOSIS — I509 Heart failure, unspecified: Secondary | ICD-10-CM | POA: Diagnosis not present

## 2015-07-10 DIAGNOSIS — Z6838 Body mass index (BMI) 38.0-38.9, adult: Secondary | ICD-10-CM | POA: Diagnosis not present

## 2015-07-25 ENCOUNTER — Other Ambulatory Visit: Payer: Self-pay | Admitting: Internal Medicine

## 2015-07-26 ENCOUNTER — Ambulatory Visit: Payer: PPO

## 2015-07-27 ENCOUNTER — Encounter: Payer: Self-pay | Admitting: Internal Medicine

## 2015-07-27 ENCOUNTER — Encounter: Payer: Self-pay | Admitting: Physician Assistant

## 2015-07-27 ENCOUNTER — Ambulatory Visit (INDEPENDENT_AMBULATORY_CARE_PROVIDER_SITE_OTHER): Payer: PPO | Admitting: Physician Assistant

## 2015-07-27 VITALS — BP 140/70 | HR 66 | Temp 97.7°F | Resp 14 | Ht 64.5 in | Wt 244.0 lb

## 2015-07-27 DIAGNOSIS — I471 Supraventricular tachycardia: Secondary | ICD-10-CM | POA: Diagnosis not present

## 2015-07-27 DIAGNOSIS — I10 Essential (primary) hypertension: Secondary | ICD-10-CM | POA: Diagnosis not present

## 2015-07-27 DIAGNOSIS — E039 Hypothyroidism, unspecified: Secondary | ICD-10-CM | POA: Diagnosis not present

## 2015-07-27 DIAGNOSIS — Z9981 Dependence on supplemental oxygen: Secondary | ICD-10-CM | POA: Diagnosis not present

## 2015-07-27 DIAGNOSIS — J449 Chronic obstructive pulmonary disease, unspecified: Secondary | ICD-10-CM | POA: Diagnosis not present

## 2015-07-27 DIAGNOSIS — Z79899 Other long term (current) drug therapy: Secondary | ICD-10-CM

## 2015-07-27 DIAGNOSIS — I5032 Chronic diastolic (congestive) heart failure: Secondary | ICD-10-CM

## 2015-07-27 DIAGNOSIS — E1122 Type 2 diabetes mellitus with diabetic chronic kidney disease: Secondary | ICD-10-CM | POA: Diagnosis not present

## 2015-07-27 DIAGNOSIS — E559 Vitamin D deficiency, unspecified: Secondary | ICD-10-CM

## 2015-07-27 DIAGNOSIS — E785 Hyperlipidemia, unspecified: Secondary | ICD-10-CM

## 2015-07-27 DIAGNOSIS — Z23 Encounter for immunization: Secondary | ICD-10-CM | POA: Diagnosis not present

## 2015-07-27 DIAGNOSIS — N181 Chronic kidney disease, stage 1: Secondary | ICD-10-CM

## 2015-07-27 DIAGNOSIS — Z720 Tobacco use: Secondary | ICD-10-CM

## 2015-07-27 DIAGNOSIS — R319 Hematuria, unspecified: Secondary | ICD-10-CM

## 2015-07-27 LAB — CBC WITH DIFFERENTIAL/PLATELET
Basophils Absolute: 0 10*3/uL (ref 0.0–0.1)
Basophils Relative: 0 % (ref 0–1)
Eosinophils Absolute: 0.1 10*3/uL (ref 0.0–0.7)
Eosinophils Relative: 1 % (ref 0–5)
HEMATOCRIT: 41.6 % (ref 36.0–46.0)
HEMOGLOBIN: 13.1 g/dL (ref 12.0–15.0)
LYMPHS PCT: 27 % (ref 12–46)
Lymphs Abs: 1.9 10*3/uL (ref 0.7–4.0)
MCH: 28 pg (ref 26.0–34.0)
MCHC: 31.5 g/dL (ref 30.0–36.0)
MCV: 88.9 fL (ref 78.0–100.0)
MONO ABS: 0.3 10*3/uL (ref 0.1–1.0)
MONOS PCT: 5 % (ref 3–12)
MPV: 9.4 fL (ref 8.6–12.4)
NEUTROS ABS: 4.6 10*3/uL (ref 1.7–7.7)
Neutrophils Relative %: 67 % (ref 43–77)
Platelets: 232 10*3/uL (ref 150–400)
RBC: 4.68 MIL/uL (ref 3.87–5.11)
RDW: 13.8 % (ref 11.5–15.5)
WBC: 6.9 10*3/uL (ref 4.0–10.5)

## 2015-07-27 LAB — HEMOGLOBIN A1C
HEMOGLOBIN A1C: 5.7 % — AB (ref <5.7)
MEAN PLASMA GLUCOSE: 117 mg/dL — AB (ref <117)

## 2015-07-27 NOTE — Patient Instructions (Addendum)
Call Dr. Deatra Ina to see if need colonosocpy this year or 2021,  Had in 2011. Or can do cologuard  Cologuard is an easy to use noninvasive colon cancer screening test based on the latest advances in stool DNA science.   Colon cancer is 3rd most diagnosed cancer and 2nd leading cause of death in both men and women 69 years of age and older despite being one of the most preventable and treatable cancers if found early. You have agreed to do a Cologuard screening and have declined a colonoscopy in spite of being explained the risks and benefits of the colonoscopy in detail, including cancer and death. Please understand that this is test not as sensitive or specific as a colonoscopy and you are still recommended to get a colonoscopy.   If you are NOT medicare please call your insurance company and given them this CPT code, [phone], in order to see how much your insurance company will cover or you can call [phone] to talk with [employer] about pricing and coverage.   You will receive a short call from [employer] support center at [employer], when you receive a call they will say they are from [place],  to confirm your mailing address and give you more information.  When they calll you, it will appear on the caller ID as "[employer]" or in some cases only this number will appear, [phone].   [employer] will ship your collection kit directly to you. You will collect a single stool sample in the privacy of your own home, no special preparation required. You will return the kit via [employer] pre-paid shipping or pick-up, in the same box it arrived in. Then I will contact you to discuss your results after I receive them from the laboratory.   If you have any questions or concerns, [employer] Customer Support Specialist are available 24 hours a day, 7 days a week at [phone] or go to [email or web address].    Preventive Care for Adults A healthy  lifestyle and preventive care can promote health and wellness. Preventive health guidelines for women include the following key practices.  A routine yearly physical is a good way to check with your health care provider about your health and preventive screening. It is a chance to share any concerns and updates on your health and to receive a thorough exam.  Visit your dentist for a routine exam and preventive care every 6 months. Brush your teeth twice a day and floss once a day. Good oral hygiene prevents tooth decay and gum disease.  The frequency of eye exams is based on your age, health, family medical history, use of contact lenses, and other factors. Follow your health care provider's recommendations for frequency of eye exams.  Eat a healthy diet. Foods like vegetables, fruits, whole grains, low-fat dairy products, and lean protein foods contain the nutrients you need without too many calories. Decrease your intake of foods high in solid fats, added sugars, and salt. Eat the right amount of calories for you.Get information about a proper diet from your health care provider, if necessary.  Regular physical exercise is one of the most important things you can do for your health. Most adults should get at least 150 minutes of moderate-intensity exercise (any activity that increases your heart rate and causes you to sweat) each week. In addition, most adults need muscle-strengthening exercises on 2 or more days a week.  Maintain a healthy weight. The body mass index (BMI)  is a screening tool to identify possible weight problems. It provides an estimate of body fat based on height and weight. Your health care provider can find your BMI and can help you achieve or maintain a healthy weight.For adults 20 years and older:  A BMI below 18.5 is considered underweight.  A BMI of 18.5 to 24.9 is normal.  A BMI of 25 to 29.9 is considered overweight.  A BMI of 30 and above is considered  obese.  Maintain normal blood lipids and cholesterol levels by exercising and minimizing your intake of saturated fat. Eat a balanced diet with plenty of fruit and vegetables. If your lipid or cholesterol levels are high, you are over 50, or you are at high risk for heart disease, you may need your cholesterol levels checked more frequently.Ongoing high lipid and cholesterol levels should be treated with medicines if diet and exercise are not working.  If you smoke, find out from your health care provider how to quit. If you do not use tobacco, do not start.  Lung cancer screening is recommended for adults aged 60-80 years who are at high risk for developing lung cancer because of a history of smoking. A yearly low-dose CT scan of the lungs is recommended for people who have at least a 30-pack-year history of smoking and are a current smoker or have quit within the past 15 years. A pack year of smoking is smoking an average of 1 pack of cigarettes a day for 1 year (for example: 1 pack a day for 30 years or 2 packs a day for 15 years). Yearly screening should continue until the smoker has stopped smoking for at least 15 years. Yearly screening should be stopped for people who develop a health problem that would prevent them from having lung cancer treatment.  Avoid use of street drugs. Do not share needles with anyone. Ask for help if you need support or instructions about stopping the use of drugs.  High blood pressure causes heart disease and increases the risk of stroke.  Ongoing high blood pressure should be treated with medicines if weight loss and exercise do not work.  If you are 70-75 years old, ask your health care provider if you should take aspirin to prevent strokes.  Diabetes screening involves taking a blood sample to check your fasting blood sugar level. This should be done once every 3 years, after age 32, if you are within normal weight and without risk factors for diabetes. Testing  should be considered at a younger age or be carried out more frequently if you are overweight and have at least 1 risk factor for diabetes.  Breast cancer screening is essential preventive care for women. You should practice "breast self-awareness." This means understanding the normal appearance and feel of your breasts and may include breast self-examination. Any changes detected, no matter how small, should be reported to a health care provider. Women in their 75s and 30s should have a clinical breast exam (CBE) by a health care provider as part of a regular health exam every 1 to 3 years. After age 47, women should have a CBE every year. Starting at age 77, women should consider having a mammogram (breast X-ray test) every year. Women who have a family history of breast cancer should talk to their health care provider about genetic screening. Women at a high risk of breast cancer should talk to their health care providers about having an MRI and a mammogram every year.  Breast cancer gene (BRCA)-related cancer risk assessment is recommended for women who have family members with BRCA-related cancers. BRCA-related cancers include breast, ovarian, tubal, and peritoneal cancers. Having family members with these cancers may be associated with an increased risk for harmful changes (mutations) in the breast cancer genes BRCA1 and BRCA2. Results of the assessment will determine the need for genetic counseling and BRCA1 and BRCA2 testing.  Routine pelvic exams to screen for cancer are no longer recommended for nonpregnant women who are considered low risk for cancer of the pelvic organs (ovaries, uterus, and vagina) and who do not have symptoms. Ask your health care provider if a screening pelvic exam is right for you.  If you have had past treatment for cervical cancer or a condition that could lead to cancer, you need Pap tests and screening for cancer for at least 20 years after your treatment. If Pap tests  have been discontinued, your risk factors (such as having a new sexual partner) need to be reassessed to determine if screening should be resumed. Some women have medical problems that increase the chance of getting cervical cancer. In these cases, your health care provider may recommend more frequent screening and Pap tests.    Colorectal cancer can be detected and often prevented. Most routine colorectal cancer screening begins at the age of 72 years and continues through age 64 years. However, your health care provider may recommend screening at an earlier age if you have risk factors for colon cancer. On a yearly basis, your health care provider may provide home test kits to check for hidden blood in the stool. Use of a small camera at the end of a tube, to directly examine the colon (sigmoidoscopy or colonoscopy), can detect the earliest forms of colorectal cancer. Talk to your health care provider about this at age 57, when routine screening begins. Direct exam of the colon should be repeated every 5-10 years through age 97 years, unless early forms of pre-cancerous polyps or small growths are found.  Osteoporosis is a disease in which the bones lose minerals and strength with aging. This can result in serious bone fractures or breaks. The risk of osteoporosis can be identified using a bone density scan. Women ages 14 years and over and women at risk for fractures or osteoporosis should discuss screening with their health care providers. Ask your health care provider whether you should take a calcium supplement or vitamin D to reduce the rate of osteoporosis.  Menopause can be associated with physical symptoms and risks. Hormone replacement therapy is available to decrease symptoms and risks. You should talk to your health care provider about whether hormone replacement therapy is right for you.  Use sunscreen. Apply sunscreen liberally and repeatedly throughout the day. You should seek shade when  your shadow is shorter than you. Protect yourself by wearing long sleeves, pants, a wide-brimmed hat, and sunglasses year round, whenever you are outdoors.  Once a month, do a whole body skin exam, using a mirror to look at the skin on your back. Tell your health care provider of new moles, moles that have irregular borders, moles that are larger than a pencil eraser, or moles that have changed in shape or color.  Stay current with required vaccines (immunizations).  Influenza vaccine. All adults should be immunized every year.  Tetanus, diphtheria, and acellular pertussis (Td, Tdap) vaccine. Pregnant women should receive 1 dose of Tdap vaccine during each pregnancy. The dose should be obtained regardless of the  length of time since the last dose. Immunization is preferred during the 27th-36th week of gestation. An adult who has not previously received Tdap or who does not know her vaccine status should receive 1 dose of Tdap. This initial dose should be followed by tetanus and diphtheria toxoids (Td) booster doses every 10 years. Adults with an unknown or incomplete history of completing a 3-dose immunization series with Td-containing vaccines should begin or complete a primary immunization series including a Tdap dose. Adults should receive a Td booster every 10 years.    Zoster vaccine. One dose is recommended for adults aged 66 years or older unless certain conditions are present.    Pneumococcal 13-valent conjugate (PCV13) vaccine. When indicated, a person who is uncertain of her immunization history and has no record of immunization should receive the PCV13 vaccine. An adult aged 75 years or older who has certain medical conditions and has not been previously immunized should receive 1 dose of PCV13 vaccine. This PCV13 should be followed with a dose of pneumococcal polysaccharide (PPSV23) vaccine. The PPSV23 vaccine dose should be obtained at least 8 weeks after the dose of PCV13 vaccine. An  adult aged 71 years or older who has certain medical conditions and previously received 1 or more doses of PPSV23 vaccine should receive 1 dose of PCV13. The PCV13 vaccine dose should be obtained 1 or more years after the last PPSV23 vaccine dose.    Pneumococcal polysaccharide (PPSV23) vaccine. When PCV13 is also indicated, PCV13 should be obtained first. All adults aged 68 years and older should be immunized. An adult younger than age 67 years who has certain medical conditions should be immunized. Any person who resides in a nursing home or long-term care facility should be immunized. An adult smoker should be immunized. People with an immunocompromised condition and certain other conditions should receive both PCV13 and PPSV23 vaccines. People with human immunodeficiency virus (HIV) infection should be immunized as soon as possible after diagnosis. Immunization during chemotherapy or radiation therapy should be avoided. Routine use of PPSV23 vaccine is not recommended for American Indians, [place] Natives, or people younger than 65 years unless there are medical conditions that require PPSV23 vaccine. When indicated, people who have unknown immunization and have no record of immunization should receive PPSV23 vaccine. One-time revaccination 5 years after the first dose of PPSV23 is recommended for people aged 19-64 years who have chronic kidney failure, nephrotic syndrome, asplenia, or immunocompromised conditions. People who received 1-2 doses of PPSV23 before age 92 years should receive another dose of PPSV23 vaccine at age 15 years or later if at least 5 years have passed since the previous dose. Doses of PPSV23 are not needed for people immunized with PPSV23 at or after age 90 years.   Preventive Services / Frequency  Ages 9 years and over  Blood pressure check.  Lipid and cholesterol check.  Lung cancer screening. / Every year if you are aged 74-80 years and have a 30-pack-year history of  smoking and currently smoke or have quit within the past 15 years. Yearly screening is stopped once you have quit smoking for at least 15 years or develop a health problem that would prevent you from having lung cancer treatment.  Clinical breast exam.** / Every year after age 26 years.  BRCA-related cancer risk assessment.** / For women who have family members with a BRCA-related cancer (breast, ovarian, tubal, or peritoneal cancers).  Mammogram.** / Every year beginning at age 44 years and continuing for  as long as you are in good health. Consult with your health care provider.  Pap test.** / Every 3 years starting at age 69 years through age 47 or 71 years with 3 consecutive normal Pap tests. Testing can be stopped between 65 and 70 years with 3 consecutive normal Pap tests and no abnormal Pap or HPV tests in the past 10 years.  Fecal occult blood test (FOBT) of stool. / Every year beginning at age 59 years and continuing until age 44 years. You may not need to do this test if you get a colonoscopy every 10 years.  Flexible sigmoidoscopy or colonoscopy.** / Every 5 years for a flexible sigmoidoscopy or every 10 years for a colonoscopy beginning at age 6 years and continuing until age 72 years.  Hepatitis C blood test.** / For all people born from [birth year] through 1965 and any individual with known risks for hepatitis C.  Osteoporosis screening.** / A one-time screening for women ages 48 years and over and women at risk for fractures or osteoporosis.  Skin self-exam. / Monthly.  Influenza vaccine. / Every year.  Tetanus, diphtheria, and acellular pertussis (Tdap/Td) vaccine.** / 1 dose of Td every 10 years.  Zoster vaccine.** / 1 dose for adults aged 52 years or older.  Pneumococcal 13-valent conjugate (PCV13) vaccine.** / Consult your health care provider.  Pneumococcal polysaccharide (PPSV23) vaccine.** / 1 dose for all adults aged 42 years and older. Screening for abdominal aortic  aneurysm (AAA)  by ultrasound is recommended for people who have history of high blood pressure or who are current or former smokers.

## 2015-07-27 NOTE — Progress Notes (Addendum)
Complete Physical Addendum: Will refer to urology with hematuria and smoking history to rule out bladder carcinoma.  Thyroid level is not in range. Increase your dose to 1 pill daily and 1.5 pills on Tuesday and Thursday.  Recheck lab only in one month.    Assessment and Plan: 1. Need for prophylactic vaccination and inoculation against influenza - Flu vaccine HIGH DOSE PF (Fluzone High dose)  2. Essential hypertension - continue medications, DASH diet, exercise and monitor at home. Call if greater than 130/80.  - CBC with Differential/Platelet - BASIC METABOLIC PANEL WITH GFR - Hepatic function panel - Urinalysis, Routine w reflex microscopic (not at [facility]) - Microalbumin / creatinine urine ratio - EKG 12-Lead  3. Chronic diastolic HF (heart failure), with recent mild exacerbation Weight controlled  4. Chronic obstructive pulmonary disease, unspecified COPD type ([place]) Advised continue to stop smoking, will get CXR in Jan when due, continue meds.   5. Type 2 diabetes mellitus with stage 1 chronic kidney disease, without long-term current use of insulin ([place]) Discussed general issues about diabetes pathophysiology and management., Educational material distributed., Suggested low cholesterol diet., Encouraged aerobic exercise., Discussed foot care., Reminded to get yearly retinal exam. - Hemoglobin A1c - Insulin, fasting  6. Tobacco abuse Patient on chantix and quitting at this time - EKG 12-Lead  7. Morbid obesity, unspecified obesity type ([place]) Obesity with co morbidities- long discussion about weight loss, diet, and exercise  8. Hypothyroidism, unspecified hypothyroidism type Hypothyroidism-check TSH level, continue medications the same, reminded to take on an empty stomach 30-6mins before food.  - TSH  9. Paroxysmal SVT (supraventricular tachycardia) (HCC) Bradycardia, occ PVC  10. Vitamin D deficiency - Vit D  25 hydroxy (rtn osteoporosis monitoring)  11. Oxygen  dependent Continue O2  12. Medication management - Magnesium  13. Hyperlipidemia -continue medications, check lipids, decrease fatty foods, increase activity.  - Lipid panel  14. Need for prophylactic vaccination against Streptococcus pneumoniae (pneumococcus) - Pneumococcal conjugate vaccine 13-valent IM  15. GERD Controlled on pepcid  Discussed med's effects and SE's. Screening labs and tests as requested with regular follow-up as recommended. Over 40 minutes of exam, counseling, chart review, and complex, high level critical decision making was performed this visit.   HPI  69 y.o. female  presents for a complete physical.  Her blood pressure has been controlled at home, normally runs 120'-130's, today their BP is BP: 140/70 mmHg She does not workout. She denies chest pain, dizziness, has chronic SOB.  Patient has severe end stage COPD with CO2 retention, she is on 3-6 L of O2 depending on activity level and on diamox for CO2 retention. She walks with walker or is in wheel chair, no falls in past year. She is currently on chantix, trying to quit, her husband, richard is not being sportive.  She is unable to do house work due to her COPD.  She is on cholesterol medication and denies myalgias. Her cholesterol is at goal. The cholesterol last visit was:   Lab Results  Component Value Date   CHOL 174 05/04/2015   HDL 70 05/04/2015   LDLCALC 85 05/04/2015   TRIG 97 05/04/2015   CHOLHDL 2.5 05/04/2015   She has been working on diet and exercise for diabetes with history of skin ulcer but with diet has gotten into the pre DM A1C, had normal ABI, has been seeing wound center,  and denies paresthesia of the feet, polydipsia, polyuria and visual disturbances. Last A1C in the office was:  Lab Results  Component Value Date   HGBA1C 5.8* 05/04/2015  Patient is on Vitamin D supplement.   Lab Results  Component Value Date   VD25OH 44 05/04/2015    She is on thyroid medication. Her  medication was not changed last visit.   Lab Results  Component Value Date   TSH 4.470 05/04/2015  .  BMI is Body mass index is 41.25 kg/(m^2)., she is morbidly obese, she is not working on diet and exercise. Wt Readings from Last 3 Encounters:  07/27/15 244 lb (110.678 kg)  06/15/15 241 lb (109.317 kg)  05/04/15 243 lb (110.224 kg)     Current Medications:  Current Outpatient Prescriptions on File Prior to Visit  Medication Sig Dispense Refill  . acetaminophen-codeine (TYLENOL #3) 300-30 MG per tablet Take 1 tablet by mouth every 8 (eight) hours as needed for moderate pain or severe pain. 90 tablet 0  . acetaZOLAMIDE (DIAMOX) 250 MG tablet Take 250 mg by mouth 2-3 times a day    . albuterol (PROVENTIL HFA;VENTOLIN HFA) 108 (90 BASE) MCG/ACT inhaler Inhale 1-2 puffs into the lungs every 6 (six) hours as needed for wheezing or shortness of breath. 18 g 2  . aspirin 81 MG tablet Take 81 mg by mouth daily.    . budesonide-formoterol (SYMBICORT) 160-4.5 MCG/ACT inhaler 2 puffs twice a day, wash mouth afterwards 1 Inhaler 12  . CHANTIX CONTINUING MONTH PAK 1 MG tablet TAKE 1/2 TO 1 TABLET 2 X A DAY AS DIRECTED FOR SMOKING CESSATION 56 tablet 2  . Cholecalciferol (VITAMIN D PO) Take 10,000 Units by mouth daily.    . diazepam (VALIUM) 5 MG tablet Take 1 tab 3 times a day for anxiety. 90 tablet 2  . diltiazem (CARDIZEM) 60 MG tablet TAKE 1 TABLET (60 MG TOTAL) BY MOUTH 2 (TWO) TIMES DAILY. 180 tablet 3  . doxycycline (VIBRA-TABS) 100 MG tablet Take 1 tablet (100 mg total) by mouth 2 (two) times daily. 20 tablet 0  . DULoxetine (CYMBALTA) 60 MG capsule TAKE 1 CAPSULE (60 MG TOTAL) BY MOUTH DAILY. 90 capsule 0  . famotidine (PEPCID) 20 MG tablet TAKE 1 TABLET (20 MG TOTAL) BY MOUTH 2 (TWO) TIMES DAILY. FOR ACID REFLUX 180 tablet 0  . furosemide (LASIX) 80 MG tablet Take 1/2 tab daily for BP and fluid. 45 tablet 0  . glucose blood test strip Check blood sugar 1 time daily-DX-E11.22 100 each 4  .  glucose monitoring kit (FREESTYLE) monitoring kit Check blood sugar 1 time daily-DX-E11.22 1 each 0  . guaiFENesin (MUCINEX) 600 MG 12 hr tablet Take 600 mg by mouth daily.    . Lancets (FREESTYLE) lancets Check blood sugar 1 time daily-DX-11.22 100 each 4  . levothyroxine (SYNTHROID) 112 MCG tablet Take 1 tablet (112 mcg total) by mouth daily. 30 tablet 11  . loratadine-pseudoephedrine (CLARITIN-D 24-HOUR) 10-240 MG per 24 hr tablet Take 1 tablet by mouth as needed.     Marland Kitchen losartan (COZAAR) 50 MG tablet TAKE 1 TABLET (50 MG TOTAL) BY MOUTH DAILY. 90 tablet 3  . Magnesium Chloride (MAGNESIUM DR PO) Take 1 capsule by mouth daily.     . metFORMIN (GLUCOPHAGE-XR) 500 MG 24 hr tablet Take 1 tablet (500 mg total) by mouth 4 (four) times daily - after meals and at bedtime. 360 tablet 3  . OXYGEN-HELIUM IN Inhale 3 L into the lungs.     . pravastatin (PRAVACHOL) 40 MG tablet Take 1 tablet (40 mg total) by mouth  daily. 90 tablet 1   No current facility-administered medications on file prior to visit.   Health Maintenance:   Immunization History  Administered Date(s) Administered  . Influenza, High Dose Seasonal PF 08/16/2014  . Pneumococcal-Unspecified 05/16/2011  . Tdap 08/16/2008   Tetanus: 2009 Pneumovax: 2012 Prevnar 13:  DUE Flu vaccine: 2016 Zostavax: DUE LMP: remote Pap: remote, declines another, has seen Dr. Carren Rang MGM: 09/2013  DEXA: 2005 Colonoscopy: 2011, due 5 or 10 years Echo 2016 Stress test 2016 normal ABI normal CXR 09/2014  Last Dental Exam: None, has dentures Last Eye Exam: Dr. Renaldo Fiddler, 2015 Patient Care Team: Unk Pinto, MD as PCP - General (Internal Medicine) Evans Lance, MD as Consulting Physician (Cardiology) Lorretta Harp, MD as Consulting Physician (Cardiology) Inda Castle, MD as Consulting Physician (Gastroenterology) Wallene Huh, DPM as Consulting Physician (Podiatry)  Allergies:  Allergies  Allergen Reactions  . Ace Inhibitors      cough  . Inderal [Propranolol]     Hair loss   Medical History:  Past Medical History  Diagnosis Date  . Hypertension   . Hyperlipidemia   . Diabetes mellitus type 2 in obese ([place])   . Morbid obesity ([place])   . Vitamin D deficiency   . COPD (chronic obstructive pulmonary disease) ([place])   . Arthritis   . GERD (gastroesophageal reflux disease)   . Depression   . Thyroid disease   . OAB (overactive bladder)   . PSVT (paroxysmal supraventricular tachycardia) [facility])    Surgical History:  Past Surgical History  Procedure Laterality Date  . Carpal tunnel release      L 1992 R 1984  . Eye surgery Bilateral     cataract  . Pubovaginal sling    . Tonsillectomy and adenoidectomy    . Transthoracic echocardiogram  01/2011    Hyperdynamic LV with EF 65-70%, Gr 1 DD, Mild Ao Stenosis - mean gradient ~19 mmHg  . Nm myoview ltd  01/2011    Dobutamine Myoview: Negative perfusion scan for ischemia / infarct (poor image capture); Pt developed SVT with Dobutamine that reproduced her CP as it resolved with restoration of NSR.   Family History:  Family History  Problem Relation Age of Onset  . Alcohol abuse Father   . Heart disease Father    Social History:  Social History  Substance Use Topics  . Smoking status: Current Every Day Smoker -- 0.50 packs/day for 44 years    Types: Cigarettes  . Smokeless tobacco: Never Used     Comment: smoke about 5-6 cigarette daily  . Alcohol Use: No    Review of Systems: Review of Systems  Constitutional: Negative for fever and chills.  HENT: Negative for congestion, ear pain, sore throat and tinnitus.   Eyes: Negative.   Respiratory: Positive for cough (is at baseline) and shortness of breath. Negative for wheezing.   Cardiovascular: Negative for chest pain, palpitations and leg swelling.  Gastrointestinal: Negative for nausea, vomiting, abdominal pain, diarrhea, constipation and blood in stool.  Genitourinary: Negative.  Negative for dysuria,  urgency and frequency.  Musculoskeletal: Positive for back pain and neck pain (especially after looking at computers). Negative for myalgias.  Neurological: Negative for dizziness and headaches.  Psychiatric/Behavioral: The patient is not nervous/anxious.     Physical Exam: Estimated body mass index is 41.25 kg/(m^2) as calculated from the following:   Height as of this encounter: 5' 4.5" (1.638 m).   Weight as of this encounter: 244 lb (110.678 kg).  BP 140/70 mmHg  Pulse 66  Temp(Src) 97.7 F (36.5 C) (Temporal)  Resp 14  Ht 5' 4.5" (1.638 m)  Wt 244 lb (110.678 kg)  BMI 41.25 kg/m2  SpO2 91% General Appearance: Well nourished, in no apparent distress.  Eyes: PERRLA, EOMs, conjunctiva no swelling or erythema, normal fundi and vessels.  Sinuses: No Frontal/maxillary tenderness  ENT/Mouth: Ext aud canals clear, normal light reflex with TMs without erythema, bulging. Good dentition. No erythema, swelling, or exudate on post pharynx. Tonsils not swollen or erythematous. Hearing normal.  Neck: Supple, thyroid normal. No bruits  Respiratory: Respiratory effort normal, BS equal bilaterally without rales, rhonchi, wheezing or stridor.  Cardio: RRR without murmurs, rubs or gallops. Brisk peripheral pulses without edema.  Chest: symmetric, with normal excursions and percussion.  Breasts: Symmetric, without lumps, nipple discharge, retractions.  Abdomen: Soft, nontender, no guarding, rebound, hernias, masses, or organomegaly.  Lymphatics: Non tender without lymphadenopathy.  Genitourinary:  Musculoskeletal: Full ROM all peripheral extremities,5/5 strength, and normal gait.  Skin: Warm, dry without rashes, lesions, ecchymosis. Neuro: Cranial nerves intact, reflexes equal bilaterally. Normal muscle tone, no cerebellar symptoms. Sensation intact.  Psych: Awake and oriented X 3, normal affect, Insight and Judgment appropriate.   EKG: WNL no ST changes. AORTA SCAN: WNL   [name] 3:11  PM [facility]

## 2015-07-28 ENCOUNTER — Telehealth: Payer: Self-pay | Admitting: Gastroenterology

## 2015-07-28 ENCOUNTER — Other Ambulatory Visit: Payer: Self-pay | Admitting: Physician Assistant

## 2015-07-28 LAB — MAGNESIUM: Magnesium: 1.8 mg/dL (ref 1.5–2.5)

## 2015-07-28 LAB — URINALYSIS, MICROSCOPIC ONLY
BACTERIA UA: NONE SEEN [HPF]
CRYSTALS: NONE SEEN [HPF]
Casts: NONE SEEN [LPF]
Yeast: NONE SEEN [HPF]

## 2015-07-28 LAB — LIPID PANEL
CHOLESTEROL: 175 mg/dL (ref 125–200)
HDL: 64 mg/dL (ref >=46)
LDL Cholesterol: 82 mg/dL (ref <130)
Total CHOL/HDL Ratio: 2.7 Ratio (ref <=5.0)
Triglycerides: 145 mg/dL (ref <150)
VLDL: 29 mg/dL (ref <30)

## 2015-07-28 LAB — VITAMIN D 25 HYDROXY (VIT D DEFICIENCY, FRACTURES): Vit D, 25-Hydroxy: 39 ng/mL (ref 30–100)

## 2015-07-28 LAB — HEPATIC FUNCTION PANEL
ALK PHOS: 102 U/L (ref 33–130)
ALT: 6 U/L (ref 6–29)
AST: 11 U/L (ref 10–35)
Albumin: 4 g/dL (ref 3.6–5.1)
Total Bilirubin: 0.3 mg/dL (ref 0.2–1.2)
Total Protein: 6.7 g/dL (ref 6.1–8.1)

## 2015-07-28 LAB — BASIC METABOLIC PANEL WITH GFR
BUN: 14 mg/dL (ref 7–25)
CALCIUM: 9 mg/dL (ref 8.6–10.4)
CO2: 31 mmol/L (ref 20–31)
CREATININE: 0.53 mg/dL (ref 0.50–0.99)
Chloride: 98 mmol/L (ref 98–110)
GFR, Est African American: >89 mL/min (ref >=60)
GLUCOSE: 103 mg/dL — AB (ref 65–99)
Potassium: 4.2 mmol/L (ref 3.5–5.3)
Sodium: 139 mmol/L (ref 135–146)

## 2015-07-28 LAB — INSULIN, FASTING: INSULIN FASTING, SERUM: 3.6 u[IU]/mL (ref 2.0–19.6)

## 2015-07-28 LAB — URINALYSIS, ROUTINE W REFLEX MICROSCOPIC
BILIRUBIN URINE: NEGATIVE
GLUCOSE, UA: NEGATIVE
KETONES UR: NEGATIVE
Leukocytes, UA: NEGATIVE
Nitrite: NEGATIVE
PH: 6 (ref 5.0–8.0)
Protein, ur: NEGATIVE
SPECIFIC GRAVITY, URINE: 1.017 (ref 1.001–1.035)

## 2015-07-28 LAB — MICROALBUMIN / CREATININE URINE RATIO
CREATININE, URINE: 100 mg/dL (ref 20–320)
Microalb Creat Ratio: 45 mcg/mg creat — ABNORMAL HIGH (ref <30)
Microalb, Ur: 4.5 mg/dL

## 2015-07-28 LAB — TSH: TSH: 8.038 u[IU]/mL — ABNORMAL HIGH (ref 0.350–4.500)

## 2015-07-28 NOTE — Addendum Note (Signed)
Addended by: [name] R on: 07/28/2015 07:44 AM   Modules accepted: Orders, SmartSet

## 2015-07-28 NOTE — Telephone Encounter (Signed)
She would be due her recall for the colonoscopy this December 2016. She is O2 dependent, so she needs to be seen before scheduling. I have 3 saved spots in December for OV if that helps. 12/27 and 12/29.

## 2015-07-30 ENCOUNTER — Other Ambulatory Visit: Payer: Self-pay | Admitting: Internal Medicine

## 2015-07-31 ENCOUNTER — Encounter: Payer: Self-pay | Admitting: Physician Assistant

## 2015-07-31 ENCOUNTER — Encounter: Payer: Self-pay | Admitting: Internal Medicine

## 2015-07-31 ENCOUNTER — Ambulatory Visit: Payer: PPO

## 2015-07-31 DIAGNOSIS — K219 Gastro-esophageal reflux disease without esophagitis: Secondary | ICD-10-CM | POA: Insufficient documentation

## 2015-07-31 NOTE — Addendum Note (Signed)
Addended by: [name] R on: 07/31/2015 02:03 PM   Modules accepted: Miquel Dunn

## 2015-08-04 ENCOUNTER — Ambulatory Visit: Payer: PPO

## 2015-08-23 ENCOUNTER — Other Ambulatory Visit: Payer: Self-pay | Admitting: Internal Medicine

## 2015-08-31 ENCOUNTER — Ambulatory Visit: Payer: Self-pay

## 2015-09-04 ENCOUNTER — Ambulatory Visit: Payer: Self-pay

## 2015-09-07 ENCOUNTER — Ambulatory Visit: Payer: Self-pay

## 2015-09-07 ENCOUNTER — Ambulatory Visit: Payer: PPO

## 2015-09-13 ENCOUNTER — Ambulatory Visit: Payer: Self-pay

## 2015-09-19 ENCOUNTER — Ambulatory Visit: Payer: Self-pay

## 2015-09-19 DIAGNOSIS — J449 Chronic obstructive pulmonary disease, unspecified: Secondary | ICD-10-CM | POA: Diagnosis not present

## 2015-09-21 ENCOUNTER — Ambulatory Visit: Payer: Self-pay

## 2015-09-27 ENCOUNTER — Other Ambulatory Visit: Payer: Self-pay | Admitting: Internal Medicine

## 2015-09-27 DIAGNOSIS — J439 Emphysema, unspecified: Secondary | ICD-10-CM

## 2015-10-05 ENCOUNTER — Ambulatory Visit: Payer: PPO

## 2015-10-15 DIAGNOSIS — J449 Chronic obstructive pulmonary disease, unspecified: Secondary | ICD-10-CM | POA: Diagnosis not present

## 2015-10-20 ENCOUNTER — Other Ambulatory Visit: Payer: Self-pay | Admitting: Internal Medicine

## 2015-10-20 ENCOUNTER — Ambulatory Visit (INDEPENDENT_AMBULATORY_CARE_PROVIDER_SITE_OTHER): Payer: PPO | Admitting: *Deleted

## 2015-10-20 DIAGNOSIS — E039 Hypothyroidism, unspecified: Secondary | ICD-10-CM

## 2015-10-20 DIAGNOSIS — J449 Chronic obstructive pulmonary disease, unspecified: Secondary | ICD-10-CM | POA: Diagnosis not present

## 2015-10-20 LAB — TSH: TSH: 10.992 u[IU]/mL — ABNORMAL HIGH (ref 0.350–4.500)

## 2015-10-20 NOTE — Progress Notes (Signed)
Patient ID: Jillian Wood, female   DOB: Jul 02, 1946, 71 y.o.   MRN: [id number] Patient is here for NV and thyroid check.  Patient is taking Levothyroxine 112 mcg 1 tab 5 days a week and 1.5 tabsd on Tuesday and Thursdays.

## 2015-10-23 ENCOUNTER — Other Ambulatory Visit: Payer: Self-pay | Admitting: Physician Assistant

## 2015-10-31 ENCOUNTER — Ambulatory Visit: Payer: PPO

## 2015-11-14 DIAGNOSIS — J449 Chronic obstructive pulmonary disease, unspecified: Secondary | ICD-10-CM | POA: Diagnosis not present

## 2015-11-15 ENCOUNTER — Ambulatory Visit: Payer: PPO

## 2015-11-17 DIAGNOSIS — J449 Chronic obstructive pulmonary disease, unspecified: Secondary | ICD-10-CM | POA: Diagnosis not present

## 2015-11-23 ENCOUNTER — Ambulatory Visit (INDEPENDENT_AMBULATORY_CARE_PROVIDER_SITE_OTHER): Payer: PPO | Admitting: Internal Medicine

## 2015-11-23 ENCOUNTER — Encounter: Payer: Self-pay | Admitting: Internal Medicine

## 2015-11-23 VITALS — BP 138/70 | HR 88 | Temp 97.8°F | Resp 18 | Ht 64.5 in | Wt 223.0 lb

## 2015-11-23 DIAGNOSIS — E559 Vitamin D deficiency, unspecified: Secondary | ICD-10-CM

## 2015-11-23 DIAGNOSIS — Z720 Tobacco use: Secondary | ICD-10-CM

## 2015-11-23 DIAGNOSIS — R6889 Other general symptoms and signs: Secondary | ICD-10-CM

## 2015-11-23 DIAGNOSIS — E039 Hypothyroidism, unspecified: Secondary | ICD-10-CM

## 2015-11-23 DIAGNOSIS — N181 Chronic kidney disease, stage 1: Secondary | ICD-10-CM | POA: Diagnosis not present

## 2015-11-23 DIAGNOSIS — D509 Iron deficiency anemia, unspecified: Secondary | ICD-10-CM

## 2015-11-23 DIAGNOSIS — I471 Supraventricular tachycardia, unspecified: Secondary | ICD-10-CM

## 2015-11-23 DIAGNOSIS — Z860100 Personal history of colon polyps, unspecified: Secondary | ICD-10-CM

## 2015-11-23 DIAGNOSIS — E785 Hyperlipidemia, unspecified: Secondary | ICD-10-CM

## 2015-11-23 DIAGNOSIS — Z0001 Encounter for general adult medical examination with abnormal findings: Secondary | ICD-10-CM | POA: Diagnosis not present

## 2015-11-23 DIAGNOSIS — E1122 Type 2 diabetes mellitus with diabetic chronic kidney disease: Secondary | ICD-10-CM

## 2015-11-23 DIAGNOSIS — Z9981 Dependence on supplemental oxygen: Secondary | ICD-10-CM

## 2015-11-23 DIAGNOSIS — I10 Essential (primary) hypertension: Secondary | ICD-10-CM | POA: Diagnosis not present

## 2015-11-23 DIAGNOSIS — I5032 Chronic diastolic (congestive) heart failure: Secondary | ICD-10-CM | POA: Diagnosis not present

## 2015-11-23 DIAGNOSIS — E1129 Type 2 diabetes mellitus with other diabetic kidney complication: Secondary | ICD-10-CM | POA: Diagnosis not present

## 2015-11-23 DIAGNOSIS — J449 Chronic obstructive pulmonary disease, unspecified: Secondary | ICD-10-CM

## 2015-11-23 DIAGNOSIS — K219 Gastro-esophageal reflux disease without esophagitis: Secondary | ICD-10-CM

## 2015-11-23 DIAGNOSIS — Z79899 Other long term (current) drug therapy: Secondary | ICD-10-CM | POA: Diagnosis not present

## 2015-11-23 DIAGNOSIS — Z0000 Encounter for general adult medical examination without abnormal findings: Secondary | ICD-10-CM

## 2015-11-23 DIAGNOSIS — L02215 Cutaneous abscess of perineum: Secondary | ICD-10-CM

## 2015-11-23 MED ORDER — DOXYCYCLINE HYCLATE 100 MG PO CAPS: 100.0000 mg | ORAL_CAPSULE | Freq: Two times a day (BID) | ORAL | Status: DC

## 2015-11-23 NOTE — Progress Notes (Signed)
Patient ID: Jillian Wood, female   DOB: 12/11/1945, 70 y.o.   MRN: [id number]  MEDICARE ANNUAL WELLNESS VISIT AND FOLLOW UP  Assessment:    1. Essential hypertension -cont meds -well controlled -dash diet -monitor at home  2. Paroxysmal SVT (supraventricular tachycardia) (HCC) -followed by cards  3. Chronic diastolic HF (heart failure), with recent mild exacerbation -cont lasix prn -monitor weight  4. Chronic obstructive pulmonary disease, unspecified COPD type (HCC) -O2 dependent -cont inhalers  5. Gastroesophageal reflux disease, esophagitis presence not specified -cont meds  6. Type 2 diabetes mellitus with stage 1 chronic kidney disease, without long-term current use of insulin (HCC) -cont meds - Hemoglobin A1c  7. Hypothyroidism, unspecified hypothyroidism type -cont levothyroxine - TSH  8. Morbid obesity, unspecified obesity type (HCC) -weight loss today -cont diet and exercise  9. IRON DEFICIENCY -diet rich and iron  10. Personal history of colonic polyps -cont monitoring with colonoscopy  11. Hyperlipidemia -cont meds - Lipid panel  12. Vitamin D deficiency -cont supplement  13. Medication management  - CBC with Differential/Platelet - BASIC METABOLIC PANEL WITH GFR - Hepatic function panel  14. Oxygen dependent -cont O2 [place]  15. Tobacco abuse -recommended quitting  16. Abscess, perineum -doxycycline -warm compresses     Over 30 minutes of exam, counseling, chart review, and critical decision making was performed  Plan:   During the course of the visit the patient was educated and counseled about appropriate screening and preventive services including:    Pneumococcal vaccine   Influenza vaccine  Td vaccine  Prevnar 13  Screening electrocardiogram  Screening mammography  Bone densitometry screening  Colorectal cancer screening  Diabetes screening  Glaucoma screening  Nutrition counseling   Advanced  directives: given info/requested copies  Conditions/risks identified: Diabetes is at goal, ACE/ARB therapy: Yes. Urinary Incontinence is not an issue: discussed non pharmacology and pharmacology options.  Fall risk: low- discussed PT, home fall assessment, medications.    Subjective:   Jillian Wood is a 70 y.o. female who presents for Medicare Annual Wellness Visit and 3 month follow up on hypertension, prediabetes, hyperlipidemia, vitamin D def.  Date of last medicare wellness visit is unknown.   Her blood pressure has been controlled at home, today their BP is BP: 138/70 mmHg She does not workout. She denies chest pain, shortness of breath, dizziness.  She is on cholesterol medication and denies myalgias. Her cholesterol is at goal. The cholesterol last visit was:   Lab Results  Component Value Date   CHOL 175 07/27/2015   HDL 64 07/27/2015   LDLCALC 82 07/27/2015   TRIG 145 07/27/2015   CHOLHDL 2.7 07/27/2015   She has been working on diet and exercise for prediabetes, and denies foot ulcerations, hyperglycemia, hypoglycemia , increased appetite, nausea, paresthesia of the feet, polydipsia, polyuria, visual disturbances, vomiting and weight loss. Last A1C in the office was:  Lab Results  Component Value Date   HGBA1C 5.7* 07/27/2015   Last GFR NonAA   Lab Results  Component Value Date   GFRNONAA >89 07/27/2015   AA  Lab Results  Component Value Date   GFRAA >89 07/27/2015   Patient is on Vitamin D supplement. Lab Results  Component Value Date   VD25OH 39 07/27/2015     Patient reports a possible abscess on her right labia which has been tender to palpation.  She notes that it did start draining and had yellow and blood fluid coming out.  She thinks it is   still there.  She has had boils and abscesses in the past.     Medication Review Current Outpatient Prescriptions on File Prior to Visit  Medication Sig Dispense Refill  . acetaminophen-codeine (TYLENOL #3)  300-30 MG per tablet Take 1 tablet by mouth every 8 (eight) hours as needed for moderate pain or severe pain. 90 tablet 0  . acetaZOLAMIDE (DIAMOX) 250 MG tablet TAKE 1 TABLET BY MOUTH 3 TIMES A DAY 90 tablet 0  . albuterol (PROVENTIL HFA;VENTOLIN HFA) 108 (90 BASE) MCG/ACT inhaler Inhale 1-2 puffs into the lungs every 6 (six) hours as needed for wheezing or shortness of breath. 18 g 2  . aspirin 81 MG tablet Take 81 mg by mouth daily.    . budesonide-formoterol (SYMBICORT) 160-4.5 MCG/ACT inhaler 2 puffs twice a day, wash mouth afterwards 1 Inhaler 12  . CHANTIX CONTINUING MONTH PAK 1 MG tablet TAKE 1/2 TO 1 TABLET 2 X A DAY AS DIRECTED FOR SMOKING CESSATION 56 tablet 2  . Cholecalciferol (VITAMIN D PO) Take 10,000 Units by mouth daily.    . diazepam (VALIUM) 5 MG tablet Take 1 tab 3 times a day for anxiety. 90 tablet 2  . diltiazem (CARDIZEM) 60 MG tablet TAKE 1 TABLET (60 MG TOTAL) BY MOUTH 2 (TWO) TIMES DAILY. 180 tablet 3  . DULoxetine (CYMBALTA) 60 MG capsule TAKE 1 CAPSULE BY MOUTH DAILY 90 capsule 0  . famotidine (PEPCID) 20 MG tablet TAKE 1 TABLET (20 MG TOTAL) BY MOUTH 2 (TWO) TIMES DAILY. FOR ACID REFLUX 180 tablet 0  . furosemide (LASIX) 80 MG tablet Take 1/2 tab daily for BP and fluid. 45 tablet 0  . glucose blood test strip Check blood sugar 1 time daily-DX-E11.22 100 each 4  . glucose monitoring kit (FREESTYLE) monitoring kit Check blood sugar 1 time daily-DX-E11.22 1 each 0  . guaiFENesin (MUCINEX) 600 MG 12 hr tablet Take 600 mg by mouth daily.    . Lancets (FREESTYLE) lancets Check blood sugar 1 time daily-DX-11.22 100 each 4  . levothyroxine (SYNTHROID) 112 MCG tablet Take 1 tablet (112 mcg total) by mouth daily. 30 tablet 11  . loratadine-pseudoephedrine (CLARITIN-D 24-HOUR) 10-240 MG per 24 hr tablet Take 1 tablet by mouth as needed.     Marland Kitchen losartan (COZAAR) 50 MG tablet TAKE 1 TABLET (50 MG TOTAL) BY MOUTH DAILY. 90 tablet 3  . Magnesium Chloride (MAGNESIUM DR PO) Take 1  capsule by mouth daily.     . metFORMIN (GLUCOPHAGE-XR) 500 MG 24 hr tablet Take 1 tablet (500 mg total) by mouth 4 (four) times daily - after meals and at bedtime. 360 tablet 3  . OXYGEN-HELIUM IN Inhale 3 L into the lungs.     . pravastatin (PRAVACHOL) 40 MG tablet TAKE 1 TABLET (40 MG TOTAL) BY MOUTH DAILY. 90 tablet 1   No current facility-administered medications on file prior to visit.    Current Problems (verified) Patient Active Problem List   Diagnosis Date Noted  . GERD (gastroesophageal reflux disease) 07/31/2015  . Tobacco abuse 10/18/2014  . Chronic diastolic HF (heart failure), with recent mild exacerbation 10/18/2014  . COPD (chronic obstructive pulmonary disease) ([place]) 09/21/2014  . Oxygen dependent 09/21/2014  . Paroxysmal SVT (supraventricular tachycardia) ([place]) 08/04/2014  . Hypothyroidism 02/22/2014  . Medication management 11/06/2013  . Essential hypertension   . Hyperlipidemia   . Morbid obesity ([place])   . Vitamin D deficiency   . 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 IRON  DEFICIENCY 07/11/2010  . Personal history of colonic polyps 07/11/2010    Screening Tests Immunization History  Administered Date(s) Administered  . Influenza, High Dose Seasonal PF 08/16/2014, 07/27/2015  . Pneumococcal Conjugate-13 07/27/2015  . Pneumococcal-Unspecified 05/16/2011  . Tdap 08/16/2008    Preventative care: Last colonoscopy: 2011 Last mammogram: Going at the end of the month, last 11/2014 DEXA:2015  Prior vaccinations: TD or Tdap: 2009  Influenza: 2016  Pneumococcal: 2015 Prevnar13: 2016 Shingles/Zostavax: Declined   Names of Other Physician/Practitioners you currently use: 1. [facility]- here for primary care 2. Has not seen, eye doctor, last visit 2016 3. Doesn't see, dentist, last visit remote Patient Care Team: William McKeown, MD as PCP - General (Internal Medicine) Gregg W Taylor, MD as Consulting Physician  (Cardiology) Jonathan J Berry, MD as Consulting Physician (Cardiology) Robert D Kaplan, MD as Consulting Physician (Gastroenterology) Norman S Regal, DPM as Consulting Physician (Podiatry)  Past Surgical History  Procedure Laterality Date  . Carpal tunnel release      L 1992 R 1984  . Eye surgery Bilateral     cataract  . Pubovaginal sling    . Tonsillectomy and adenoidectomy    . Transthoracic echocardiogram  01/2011    Hyperdynamic LV with EF 65-70%, Gr 1 DD, Mild Ao Stenosis - mean gradient ~19 mmHg  . Nm myoview ltd  01/2011    Dobutamine Myoview: Negative perfusion scan for ischemia / infarct (poor image capture); Pt developed SVT with Dobutamine that reproduced her CP as it resolved with restoration of NSR.   Family History  Problem Relation Age of Onset  . Alcohol abuse Father   . Heart disease Father    Social History  Substance Use Topics  . Smoking status: Current Every Day Smoker -- 0.50 packs/day for 44 years    Types: Cigarettes  . Smokeless tobacco: Never Used     Comment: smoke about 5-6 cigarette daily  . Alcohol Use: No    MEDICARE WELLNESS OBJECTIVES: Tobacco use: She does smoke.  Patient is a former smoker. If yes, counseling given Alcohol Current alcohol use: none Osteoporosis: postmenopausal estrogen deficiency, History of fracture in the past year: no Fall risk: Moderate Risk Hearing: normal Visual acuity: normal,  does perform annual eye exam Diet: well balanced Physical activity:   Cardiac risk factors:   Depression/mood screen:   Depression screen PHQ 2/9 11/23/2015  Decreased Interest 0  Down, Depressed, Hopeless 0  PHQ - 2 Score 0    ADLs:  In your present state of health, do you have any difficulty performing the following activities: 01/18/2015  Hearing? N  Vision? N  Difficulty concentrating or making decisions? N  Walking or climbing stairs? Y  Dressing or bathing? N  Doing errands, shopping? Y     Cognitive Testing  Alert? Yes   Normal Appearance?Yes  Oriented to person? Yes  Place? Yes   Time? Yes  Recall of three objects?  Yes  Can perform simple calculations? Yes  Displays appropriate judgment?Yes  Can read the correct time from a watch face?Yes  EOL planning: Does patient have an advance directive?: No Would patient like information on creating an advanced directive?: Yes - Educational materials given   Objective:   Today's Vitals   11/23/15 1547  BP: 138/70  Pulse: 88  Temp: 97.8 F (36.6 C)  TempSrc: Temporal  Resp: 18  Height: 5' 4.5" (1.638 m)  Weight: 223 lb (101.152 kg)   Body mass index   is 37.7 kg/(m^2).   Wt Readings from Last 3 Encounters:  11/23/15 223 lb (101.152 kg)  07/27/15 244 lb (110.678 kg)  06/15/15 241 lb (109.317 kg)     General appearance: alert, no distress, WD/WN,  female HEENT: normocephalic, sclerae anicteric, TMs pearly, nares patent, no discharge or erythema, pharynx normal Oral cavity: MMM, no lesions Neck: supple, no lymphadenopathy, no thyromegaly, no masses Heart: RRR, normal S1, S2, no murmurs Lungs: CTA bilaterally, no wheezes, rhonchi, or rales Abdomen: +bs, soft, non tender, non distended, no masses, no hepatomegaly, no splenomegaly Musculoskeletal: nontender, no swelling, no obvious deformity Extremities: no edema, no cyanosis, no clubbing Pulses: 2+ symmetric, upper and lower extremities, normal cap refill Neurological: alert, oriented x 3, CN2-12 intact, strength normal upper extremities and lower extremities, sensation normal throughout, DTRs 2+ throughout, no cerebellar signs, gait normal Psychiatric: normal affect, behavior normal, pleasant  Skin:  Abscess of the right labia with tenderness to palpation.  There is also a smal 0.5 cm nodule approximately 1 cm away from the anus.  Visible skin tags.   Breast: defer Gyn: defer Rectal: defer   Medicare Attestation I have personally reviewed: The patient's medical and social history Their use of  alcohol, tobacco or illicit drugs Their current medications and supplements The patient's functional ability including ADLs,fall risks, home safety risks, cognitive, and hearing and visual impairment Diet and physical activities Evidence for depression or mood disorders  The patient's weight, height, BMI, and visual acuity have been recorded in the chart.  I have made referrals, counseling, and provided education to the patient based on review of the above and I have provided the patient with a written personalized care plan for preventive services.      Forcucci, PA-C   11/23/2015    

## 2015-11-23 NOTE — Progress Notes (Deleted)
Assessment and Plan:  Hypertension:  -Continue medication -monitor blood pressure at home. -Continue DASH diet -Reminder to go to the ER if any CP, SOB, nausea, dizziness, severe HA, changes vision/speech, left arm numbness and tingling and jaw pain.  Cholesterol - Continue diet and exercise -Check cholesterol.   Diabetes {with or without complications:30421263} -Continue diet and exercise.  -Check A1C  Vitamin D Def -check level -continue medications.   Continue diet and meds as discussed. Further disposition pending results of labs. Discussed med's effects and SE's.    HPI 70 y.o. female  presents for 3 month follow up with hypertension, hyperlipidemia, diabetes and vitamin D deficiency.   Her blood pressure {HAS HAS NOT:18834} been controlled at home, today their BP is  .She {DOES_DOES NGE:95284} workout. She denies chest pain, shortness of breath, dizziness.   She {ACTION; IS/IS XLK:44010272} on cholesterol medication and denies myalgias. Her cholesterol {ACTION; IS/IS NOT:21021397} at goal. The cholesterol was:  07/27/2015: Cholesterol 175; HDL 64; LDL Cholesterol 82; Triglycerides 145   She {Has/has not:18111} been working on diet and exercise for diabetes {with or without complications:30421263}, she {ACTION; IS/IS NOT:21021397} on bASA, she {ACTION; IS/IS ZDG:64403474} on ACE/ARB, and denies  {Symptoms; diabetes w/o none:19199}. Last A1C was: 07/27/2015: Hgb A1c MFr Bld 5.7*   Patient is on Vitamin D supplement. 07/27/2015: Vit D, 25-Hydroxy 39    Current Medications:  Current Outpatient Prescriptions on File Prior to Visit  Medication Sig Dispense Refill  . acetaminophen-codeine (TYLENOL #3) 300-30 MG per tablet Take 1 tablet by mouth every 8 (eight) hours as needed for moderate pain or severe pain. 90 tablet 0  . acetaZOLAMIDE (DIAMOX) 250 MG tablet TAKE 1 TABLET BY MOUTH 3 TIMES A DAY 90 tablet 0  . albuterol (PROVENTIL HFA;VENTOLIN HFA) 108 (90 BASE) MCG/ACT  inhaler Inhale 1-2 puffs into the lungs every 6 (six) hours as needed for wheezing or shortness of breath. 18 g 2  . aspirin 81 MG tablet Take 81 mg by mouth daily.    . budesonide-formoterol (SYMBICORT) 160-4.5 MCG/ACT inhaler 2 puffs twice a day, wash mouth afterwards 1 Inhaler 12  . CHANTIX CONTINUING MONTH PAK 1 MG tablet TAKE 1/2 TO 1 TABLET 2 X A DAY AS DIRECTED FOR SMOKING CESSATION 56 tablet 2  . Cholecalciferol (VITAMIN D PO) Take 10,000 Units by mouth daily.    . diazepam (VALIUM) 5 MG tablet Take 1 tab 3 times a day for anxiety. 90 tablet 2  . diltiazem (CARDIZEM) 60 MG tablet TAKE 1 TABLET (60 MG TOTAL) BY MOUTH 2 (TWO) TIMES DAILY. 180 tablet 3  . doxycycline (VIBRA-TABS) 100 MG tablet Take 1 tablet (100 mg total) by mouth 2 (two) times daily. 20 tablet 0  . DULoxetine (CYMBALTA) 60 MG capsule TAKE 1 CAPSULE BY MOUTH DAILY 90 capsule 0  . famotidine (PEPCID) 20 MG tablet TAKE 1 TABLET (20 MG TOTAL) BY MOUTH 2 (TWO) TIMES DAILY. FOR ACID REFLUX 180 tablet 0  . furosemide (LASIX) 80 MG tablet Take 1/2 tab daily for BP and fluid. 45 tablet 0  . glucose blood test strip Check blood sugar 1 time daily-DX-E11.22 100 each 4  . glucose monitoring kit (FREESTYLE) monitoring kit Check blood sugar 1 time daily-DX-E11.22 1 each 0  . guaiFENesin (MUCINEX) 600 MG 12 hr tablet Take 600 mg by mouth daily.    . Lancets (FREESTYLE) lancets Check blood sugar 1 time daily-DX-11.22 100 each 4  . levothyroxine (SYNTHROID) 112 MCG tablet Take 1 tablet (112  mcg total) by mouth daily. 30 tablet 11  . loratadine-pseudoephedrine (CLARITIN-D 24-HOUR) 10-240 MG per 24 hr tablet Take 1 tablet by mouth as needed.     Marland Kitchen losartan (COZAAR) 50 MG tablet TAKE 1 TABLET (50 MG TOTAL) BY MOUTH DAILY. 90 tablet 3  . Magnesium Chloride (MAGNESIUM DR PO) Take 1 capsule by mouth daily.     . metFORMIN (GLUCOPHAGE-XR) 500 MG 24 hr tablet Take 1 tablet (500 mg total) by mouth 4 (four) times daily - after meals and at bedtime.  360 tablet 3  . OXYGEN-HELIUM IN Inhale 3 L into the lungs.     . pravastatin (PRAVACHOL) 40 MG tablet TAKE 1 TABLET (40 MG TOTAL) BY MOUTH DAILY. 90 tablet 1   No current facility-administered medications on file prior to visit.   Medical History:  Past Medical History  Diagnosis Date  . Hypertension   . Hyperlipidemia   . Diabetes mellitus type 2 in obese ([place])   . Morbid obesity ([place])   . Vitamin D deficiency   . COPD (chronic obstructive pulmonary disease) ([place])   . Arthritis   . GERD (gastroesophageal reflux disease)   . Depression   . Thyroid disease   . OAB (overactive bladder)   . PSVT (paroxysmal supraventricular tachycardia) (HCC)    Allergies:  Allergies  Allergen Reactions  . Ace Inhibitors     cough  . Inderal [Propranolol]     Hair loss     Review of Systems:  ROS  Family history- Review and unchanged  Social history- Review and unchanged  Physical Exam: There were no vitals taken for this visit. Wt Readings from Last 3 Encounters:  07/27/15 244 lb (110.678 kg)  06/15/15 241 lb (109.317 kg)  05/04/15 243 lb (110.224 kg)   General Appearance: Well nourished well developed, non-toxic appearing, in no apparent distress. Eyes: PERRLA, EOMs, conjunctiva no swelling or erythema ENT/Mouth: Ear canals clear with no erythema, swelling, or discharge.  TMs normal bilaterally, oropharynx clear, moist, with no exudate.   Neck: Supple, thyroid normal, no JVD, no cervical adenopathy.  Respiratory: Respiratory effort normal, breath sounds clear A&P, no wheeze, rhonchi or rales noted.  No retractions, no accessory muscle usage Cardio: RRR with no MRGs. No noted edema.  Abdomen: Soft, + BS.  Non tender, no guarding, rebound, hernias, masses. Musculoskeletal: Full ROM, 5/5 strength, {PSY - GAIT AND STATION:22860} gait Skin: Warm, dry without rashes, lesions, ecchymosis.  Neuro: Awake and oriented X 3, Cranial nerves intact. No cerebellar symptoms.  Psych: normal  affect, Insight and Judgment appropriate.    Starlyn Skeans, PA-C 3:47 PM [facility]

## 2015-11-24 ENCOUNTER — Ambulatory Visit: Payer: Self-pay

## 2015-11-24 LAB — HEPATIC FUNCTION PANEL
ALT: 9 U/L (ref 6–29)
AST: 13 U/L (ref 10–35)
Albumin: 4 g/dL (ref 3.6–5.1)
Alkaline Phosphatase: 96 U/L (ref 33–130)
BILIRUBIN TOTAL: 0.2 mg/dL (ref 0.2–1.2)
Bilirubin, Direct: <0.1 mg/dL (ref <=0.2)
Total Protein: 6.6 g/dL (ref 6.1–8.1)

## 2015-11-24 LAB — CBC WITH DIFFERENTIAL/PLATELET
BASOS PCT: 0 % (ref 0–1)
Basophils Absolute: 0 10*3/uL (ref 0.0–0.1)
EOS ABS: 0.1 10*3/uL (ref 0.0–0.7)
Eosinophils Relative: 1 % (ref 0–5)
HCT: 41 % (ref 36.0–46.0)
HEMOGLOBIN: 12.6 g/dL (ref 12.0–15.0)
Lymphocytes Relative: 30 % (ref 12–46)
Lymphs Abs: 2.2 10*3/uL (ref 0.7–4.0)
MCH: 26.3 pg (ref 26.0–34.0)
MCHC: 30.7 g/dL (ref 30.0–36.0)
MCV: 85.4 fL (ref 78.0–100.0)
MPV: 9.7 fL (ref 8.6–12.4)
Monocytes Absolute: 0.4 10*3/uL (ref 0.1–1.0)
Monocytes Relative: 6 % (ref 3–12)
NEUTROS ABS: 4.5 10*3/uL (ref 1.7–7.7)
NEUTROS PCT: 63 % (ref 43–77)
PLATELETS: 244 10*3/uL (ref 150–400)
RBC: 4.8 MIL/uL (ref 3.87–5.11)
RDW: 14 % (ref 11.5–15.5)
WBC: 7.2 10*3/uL (ref 4.0–10.5)

## 2015-11-24 LAB — LIPID PANEL
CHOL/HDL RATIO: 2.4 ratio (ref <=5.0)
CHOLESTEROL: 157 mg/dL (ref 125–200)
HDL: 66 mg/dL (ref >=46)
LDL Cholesterol: 61 mg/dL (ref <130)
TRIGLYCERIDES: 148 mg/dL (ref <150)
VLDL: 30 mg/dL (ref <30)

## 2015-11-24 LAB — HEMOGLOBIN A1C
Hgb A1c MFr Bld: 5.9 % — ABNORMAL HIGH (ref <5.7)
MEAN PLASMA GLUCOSE: 123 mg/dL — AB (ref <117)

## 2015-11-24 LAB — BASIC METABOLIC PANEL WITH GFR
BUN: 16 mg/dL (ref 7–25)
CALCIUM: 9.2 mg/dL (ref 8.6–10.4)
CHLORIDE: 98 mmol/L (ref 98–110)
CO2: 30 mmol/L (ref 20–31)
Creat: 0.63 mg/dL (ref 0.50–0.99)
GFR, Est African American: >89 mL/min (ref >=60)
Glucose, Bld: 88 mg/dL (ref 65–99)
Potassium: 5.2 mmol/L (ref 3.5–5.3)
SODIUM: 137 mmol/L (ref 135–146)

## 2015-11-24 LAB — TSH: TSH: 0.83 m[IU]/L

## 2015-11-27 ENCOUNTER — Other Ambulatory Visit: Payer: Self-pay | Admitting: *Deleted

## 2015-11-27 MED ORDER — LEVOTHYROXINE SODIUM 112 MCG PO TABS: 112.0000 ug | ORAL_TABLET | Freq: Every day | ORAL | Status: DC

## 2015-12-03 ENCOUNTER — Other Ambulatory Visit: Payer: Self-pay | Admitting: Internal Medicine

## 2015-12-04 ENCOUNTER — Other Ambulatory Visit: Payer: Self-pay | Admitting: *Deleted

## 2015-12-04 DIAGNOSIS — J439 Emphysema, unspecified: Secondary | ICD-10-CM

## 2015-12-04 MED ORDER — ACETAZOLAMIDE 250 MG PO TABS: 250.0000 mg | ORAL_TABLET | Freq: Three times a day (TID) | ORAL | Status: DC

## 2015-12-13 DIAGNOSIS — J449 Chronic obstructive pulmonary disease, unspecified: Secondary | ICD-10-CM | POA: Diagnosis not present

## 2015-12-18 ENCOUNTER — Other Ambulatory Visit: Payer: Self-pay | Admitting: *Deleted

## 2015-12-18 ENCOUNTER — Other Ambulatory Visit: Payer: Self-pay | Admitting: Internal Medicine

## 2015-12-18 DIAGNOSIS — J449 Chronic obstructive pulmonary disease, unspecified: Secondary | ICD-10-CM | POA: Diagnosis not present

## 2015-12-18 MED ORDER — LEVOTHYROXINE SODIUM 112 MCG PO TABS: 112.0000 ug | ORAL_TABLET | Freq: Every day | ORAL | Status: DC

## 2016-01-05 ENCOUNTER — Encounter: Payer: Self-pay | Admitting: *Deleted

## 2016-01-07 ENCOUNTER — Other Ambulatory Visit: Payer: Self-pay | Admitting: Internal Medicine

## 2016-01-13 DIAGNOSIS — J449 Chronic obstructive pulmonary disease, unspecified: Secondary | ICD-10-CM | POA: Diagnosis not present

## 2016-01-17 ENCOUNTER — Other Ambulatory Visit: Payer: Self-pay | Admitting: Internal Medicine

## 2016-01-17 DIAGNOSIS — J449 Chronic obstructive pulmonary disease, unspecified: Secondary | ICD-10-CM | POA: Diagnosis not present

## 2016-02-04 ENCOUNTER — Other Ambulatory Visit: Payer: Self-pay | Admitting: Internal Medicine

## 2016-02-12 DIAGNOSIS — J449 Chronic obstructive pulmonary disease, unspecified: Secondary | ICD-10-CM | POA: Diagnosis not present

## 2016-02-14 ENCOUNTER — Telehealth: Payer: Self-pay | Admitting: *Deleted

## 2016-02-14 NOTE — Telephone Encounter (Signed)
Patient called and asked which OTC cough syrup she can take.  Per Dr Melford Aase, she can take OTC Delsym.

## 2016-02-17 DIAGNOSIS — J449 Chronic obstructive pulmonary disease, unspecified: Secondary | ICD-10-CM | POA: Diagnosis not present

## 2016-02-29 ENCOUNTER — Ambulatory Visit (INDEPENDENT_AMBULATORY_CARE_PROVIDER_SITE_OTHER): Payer: PPO | Admitting: Internal Medicine

## 2016-02-29 ENCOUNTER — Encounter: Payer: Self-pay | Admitting: Internal Medicine

## 2016-02-29 VITALS — BP 138/86 | HR 80 | Temp 97.5°F | Resp 16 | Ht 64.5 in | Wt 250.2 lb

## 2016-02-29 DIAGNOSIS — Z79899 Other long term (current) drug therapy: Secondary | ICD-10-CM | POA: Diagnosis not present

## 2016-02-29 DIAGNOSIS — J449 Chronic obstructive pulmonary disease, unspecified: Secondary | ICD-10-CM | POA: Diagnosis not present

## 2016-02-29 DIAGNOSIS — E559 Vitamin D deficiency, unspecified: Secondary | ICD-10-CM

## 2016-02-29 DIAGNOSIS — E1122 Type 2 diabetes mellitus with diabetic chronic kidney disease: Secondary | ICD-10-CM

## 2016-02-29 DIAGNOSIS — E039 Hypothyroidism, unspecified: Secondary | ICD-10-CM | POA: Diagnosis not present

## 2016-02-29 DIAGNOSIS — I10 Essential (primary) hypertension: Secondary | ICD-10-CM | POA: Diagnosis not present

## 2016-02-29 DIAGNOSIS — K219 Gastro-esophageal reflux disease without esophagitis: Secondary | ICD-10-CM

## 2016-02-29 DIAGNOSIS — N183 Chronic kidney disease, stage 3 (moderate): Secondary | ICD-10-CM | POA: Diagnosis not present

## 2016-02-29 DIAGNOSIS — E1129 Type 2 diabetes mellitus with other diabetic kidney complication: Secondary | ICD-10-CM | POA: Diagnosis not present

## 2016-02-29 DIAGNOSIS — E785 Hyperlipidemia, unspecified: Secondary | ICD-10-CM

## 2016-02-29 LAB — CBC WITH DIFFERENTIAL/PLATELET
Basophils Absolute: 0 cells/uL (ref 0–200)
Basophils Relative: 0 %
EOS PCT: 1 %
Eosinophils Absolute: 65 cells/uL (ref 15–500)
HCT: 38.6 % (ref 35.0–45.0)
Hemoglobin: 12 g/dL (ref 11.7–15.5)
LYMPHS PCT: 30 %
Lymphs Abs: 1950 cells/uL (ref 850–3900)
MCH: 26.5 pg — ABNORMAL LOW (ref 27.0–33.0)
MCHC: 31.1 g/dL — AB (ref 32.0–36.0)
MCV: 85.4 fL (ref 80.0–100.0)
MPV: 9.9 fL (ref 7.5–12.5)
Monocytes Absolute: 325 cells/uL (ref 200–950)
Monocytes Relative: 5 %
Neutro Abs: 4160 cells/uL (ref 1500–7800)
Neutrophils Relative %: 64 %
Platelets: 265 10*3/uL (ref 140–400)
RBC: 4.52 MIL/uL (ref 3.80–5.10)
RDW: 14.5 % (ref 11.0–15.0)
WBC: 6.5 10*3/uL (ref 3.8–10.8)

## 2016-02-29 NOTE — Progress Notes (Signed)
Patient ID: Jillian Wood, female   DOB: September 13, 1946, 70 y.o.   MRN: [id number]  [facility] ADULT & ADOLESCENT INTERNAL MEDICINE                       Unk Pinto, M.D.        Uvaldo Bristle. Silverio Lay, P.A.-C       Starlyn Skeans, P.A.-C   [facility]                [address] [place], [place] [id number] Telephone [phone] Telefax [phone] _________________________________________________________________________     This very nice 70 y.o. MWF presents for 3 month follow up with Hypertension, End Stage O2 Dependent COPD, Hyperlipidemia, T2_NIDDM, Hypothyroidism and Vitamin D Deficiency. Patient has been on O2 since circa 2011 and had had issues with CO2 retention and usually tries to maintain a max O2 at 92-93% to avoid relative oxygen toxicity and extinguishing her hypoxic drive. She's also on Diamox for Co2 retention. Patient is in a wheelchair due to severe deconditioning.    Patient is treated for HTN since circa 1980's & BP has been controlled at home. Today's BP: 138/86 mmHg. Patient has had no complaints of any cardiac type chest pain, palpitations, dyspnea/orthopnea/PND, dizziness, claudication, or dependent edema.     Hyperlipidemia is controlled with diet & meds. Patient denies myalgias or other med SE's. Last Lipids were Cholesterol 157; HDL 66; LDL 61; Triglycerides 148 on 11/23/2015.     Also, the patient has history of Morbid Obesity (BMI 43+) and consequent T2_NIDDM w/Stage 3 CKD since 2011 and has had no symptoms of reactive hypoglycemia, diabetic polys, paresthesias or visual blurring.  Last A1c was  5.9% on 11/23/2015.     Further, the patient also has history of Vitamin D Deficiency of "9" in 2008 and supplements vitamin D without any suspected side-effects. Last vitamin D was 39 on 07/27/2015.    Medication Sig  . TYLENOL #3 - 300-30 MG  Take 1 tab every 8 \ hrs as needed   . acetaZOLAMIDE / DIAMOX 250 MG  Take 1 tab 3  times daily.  Marland Kitchen albuterol HFA inhaler Inhale 1-2 puffs  every 6  hrs as needed   . aspirin 81 MG \ Take 81 mg by mouth daily.  . SYMBICORT 160-4.5 inhaler 2 puffs twice a day  . CHANTIX CONTIN MON PAK 1 MG TAKE 1/2 TO 1 TAB 2 X A DAY AS DIRECTED   . VITAMIN D Take 10,000 Units by mouth daily.  . diazepam  5 MG tablet Take 1 tab 3 times a day for anxiety.  Marland Kitchen diltiazem  60 MG tablet TAKE 1 TAB 2  TIMES DAILY.  . DULoxetine  60 MG capsule TAKE 1 CAP DAILY  . famotidine (PEPCID) 20 MG tablet TAKE 1 TAB 2  TIMES DAILY. FOR ACID REFLUX  . furosemide (LASIX) 80 MG tablet Take 1/2 tab daily for BP and fluid.  Marland Kitchen guaiFENesin  600 MG 12 hr Take 6 daily.  Marland Kitchen levothyroxine 112 MCG Take 1 tab daily.  Marland Kitchen CLARITIN-D 24-HR 10-240 MG  Take 1 tab as needed.   Marland Kitchen losartan 50 MG tablet TAKE 1 TAB DAILY.  . Magnesium Take 1 cap daily.   . metFORMIN-XR 500 MG  Take 1 tab 4  times daily - after meals and at bedtime.  Donell Sievert IN Inhale  3 L into the lungs.   . pravastatin  40 MG tablet TAKE 1 TAB DAILY.   Allergies  Allergen Reactions  . Ace Inhibitors     cough  . Inderal [Propranolol]     Hair loss   PMHx:   Past Medical History  Diagnosis Date  . Hypertension   . Hyperlipidemia   . Diabetes mellitus type 2 in obese ([place])   . Morbid obesity ([place])   . Vitamin D deficiency   . COPD (chronic obstructive pulmonary disease) ([place])   . Arthritis   . GERD (gastroesophageal reflux disease)   . Depression   . Thyroid disease   . OAB (overactive bladder)   . PSVT (paroxysmal supraventricular tachycardia) ([place])    Immunization History  Administered Date(s) Administered  . Influenza, High Dose Seasonal PF 08/16/2014, 07/27/2015  . Pneumococcal Conjugate-13 07/27/2015  . Pneumococcal-Unspecified 05/16/2011  . Tdap 08/16/2008   Past Surgical History  Procedure Laterality Date  . Carpal tunnel release      L 1992 R 1984  . Eye surgery Bilateral     cataract  . Pubovaginal sling    .  Tonsillectomy and adenoidectomy    . Transthoracic echocardiogram  01/2011    Hyperdynamic LV with EF 65-70%, Gr 1 DD, Mild Ao Stenosis - mean gradient ~19 mmHg  . Nm myoview ltd  01/2011    Dobutamine Myoview: Negative perfusion scan for ischemia / infarct (poor image capture); Pt developed SVT with Dobutamine that reproduced her CP as it resolved with restoration of NSR.   FHx:    Reviewed / unchanged  SHx:    Reviewed / unchanged  Systems Review:  Constitutional: Denies fever, chills, wt changes, headaches, insomnia, fatigue, night sweats, change in appetite. Eyes: Denies redness, blurred vision, diplopia, discharge, itchy, watery eyes.  ENT: Denies discharge, congestion, post nasal drip, epistaxis, sore throat, earache, hearing loss, dental pain, tinnitus, vertigo, sinus pain, snoring.  CV: Denies chest pain, palpitations, irregular heartbeat, syncope, dyspnea, diaphoresis, orthopnea, PND, claudication or edema. Respiratory: denies cough, dyspnea, DOE, pleurisy, hoarseness, laryngitis, wheezing.  Gastrointestinal: Denies dysphagia, odynophagia, heartburn, reflux, water brash, abdominal pain or cramps, nausea, vomiting, bloating, diarrhea, constipation, hematemesis, melena, hematochezia  or hemorrhoids. Genitourinary: Denies dysuria, frequency, urgency, nocturia, hesitancy, discharge, hematuria or flank pain. Musculoskeletal: Denies arthralgias, myalgias, stiffness, jt. swelling, pain, limping or strain/sprain.  Skin: Denies pruritus, rash, hives, warts, acne, eczema or change in skin lesion(s). Neuro: No weakness, tremor, incoordination, spasms, paresthesia or pain. Psychiatric: Denies confusion, memory loss or sensory loss. Endo: Denies change in weight, skin or hair change.  Heme/Lymph: No excessive bleeding, bruising or enlarged lymph nodes.  Physical Exam  BP 138/86 mmHg  Pulse 80  Temp(Src) 97.5 F (36.4 C)  Resp 16  Ht 5' 4.5" (1.638 m)  Wt 250 lb 3.2 oz (113.49 kg)  BMI  42.30 kg/m2  Appears over nourished and morbidly obese & in no distress.  Eyes: PERRLA, EOMs, conjunctiva no swelling or erythema. Sinuses: No frontal/maxillary tenderness ENT/Mouth: EAC's clear, TM's nl w/o erythema, bulging. Nares clear w/o erythema, swelling, exudates. Oropharynx clear without erythema or exudates. Oral hygiene is good. Tongue normal, non obstructing. Hearing intact.  Neck: Supple. Thyroid nl. Car 2+/2+ without bruits, nodes or JVD. Chest:  BS very distant & equal w/o rales, rhonchi, wheezing or stridor.  Cor: Heart sounds soft w/ regular rate and rhythm without sig. murmurs. Peripheral pulses 1+/1+ and equal  With1+ ankle edema.  Abdomen: Soft & bowel sounds normal.  Non-tender w/o guarding, rebound, hernias, masses, or organomegaly.  Lymphatics: Unremarkable.  Musculoskeletal: Generalized decrease in muscle power, tone and bulk.  Skin: Warm, dry without exposed rashes, lesions, cyanosis, icterus or ecchymosis apparent. Moderate clubbing of fingernails. Neuro: Cranial nerves intact, reflexes equal bilaterally. Sensory-motor testing grossly intact. Tendon reflexes flat thru-out.  Pysch: Alert & oriented x 3.  Insight and judgement nl & appropriate. No ideations.  Assessment and Plan:  1. Essential hypertension  - TSH  2. Hyperlipidemia  - Lipid panel - TSH  3. Type 2 diabetes mellitus with stage 3 chronic kidney disease, without long-term current use of insulin (HCC)  - Hemoglobin A1c - Insulin, random  4. Vitamin D deficiency  - VITAMIN D 25 Hydroxy (Vit-D Deficiency, Fractures)  5. Hypothyroidism, unspecified hypothyroidism type  - TSH  6. Chronic obstructive pulmonary disease, unspecified COPD type ([place])   7. Gastroesophageal reflux disease, esophagitis presence not specified   8. Medication management  - CBC with Differential/Platelet - BASIC METABOLIC PANEL WITH GFR - Hepatic function panel - Magnesium   Recommended regular exercise, BP  monitoring, weight control, and discussed med and SE's. Recommended labs to assess and monitor clinical status. Further disposition pending results of labs. Over 30 minutes of exam, counseling, chart review was performed

## 2016-02-29 NOTE — Patient Instructions (Signed)

## 2016-03-01 LAB — BASIC METABOLIC PANEL WITH GFR
BUN: 13 mg/dL (ref 7–25)
CALCIUM: 9.2 mg/dL (ref 8.6–10.4)
CO2: 30 mmol/L (ref 20–31)
Chloride: 101 mmol/L (ref 98–110)
Creat: 0.58 mg/dL — ABNORMAL LOW (ref 0.60–0.93)
GFR, Est African American: >89 mL/min (ref >=60)
Glucose, Bld: 97 mg/dL (ref 65–99)
Potassium: 4.2 mmol/L (ref 3.5–5.3)
SODIUM: 140 mmol/L (ref 135–146)

## 2016-03-01 LAB — MAGNESIUM: MAGNESIUM: 1.7 mg/dL (ref 1.5–2.5)

## 2016-03-01 LAB — TSH: TSH: 0.94 mIU/L

## 2016-03-01 LAB — HEMOGLOBIN A1C
Hgb A1c MFr Bld: 5.7 % — ABNORMAL HIGH (ref <5.7)
MEAN PLASMA GLUCOSE: 117 mg/dL

## 2016-03-01 LAB — LIPID PANEL
CHOLESTEROL: 156 mg/dL (ref 125–200)
HDL: 76 mg/dL (ref >=46)
LDL Cholesterol: 57 mg/dL (ref <130)
TRIGLYCERIDES: 117 mg/dL (ref <150)
Total CHOL/HDL Ratio: 2.1 Ratio (ref <=5.0)
VLDL: 23 mg/dL (ref <30)

## 2016-03-01 LAB — HEPATIC FUNCTION PANEL
ALT: 10 U/L (ref 6–29)
AST: 14 U/L (ref 10–35)
Albumin: 4 g/dL (ref 3.6–5.1)
Alkaline Phosphatase: 81 U/L (ref 33–130)
BILIRUBIN DIRECT: 0.1 mg/dL (ref <=0.2)
BILIRUBIN INDIRECT: 0.1 mg/dL — AB (ref 0.2–1.2)
TOTAL PROTEIN: 6.6 g/dL (ref 6.1–8.1)
Total Bilirubin: 0.2 mg/dL (ref 0.2–1.2)

## 2016-03-01 LAB — VITAMIN D 25 HYDROXY (VIT D DEFICIENCY, FRACTURES): VIT D 25 HYDROXY: 45 ng/mL (ref 30–100)

## 2016-03-01 LAB — INSULIN, RANDOM: Insulin: 5.1 u[IU]/mL (ref 2.0–19.6)

## 2016-03-02 ENCOUNTER — Other Ambulatory Visit: Payer: Self-pay | Admitting: Physician Assistant

## 2016-03-02 DIAGNOSIS — E785 Hyperlipidemia, unspecified: Secondary | ICD-10-CM

## 2016-03-07 ENCOUNTER — Other Ambulatory Visit: Payer: Self-pay | Admitting: Physician Assistant

## 2016-03-11 ENCOUNTER — Other Ambulatory Visit: Payer: Self-pay | Admitting: Internal Medicine

## 2016-03-14 DIAGNOSIS — J449 Chronic obstructive pulmonary disease, unspecified: Secondary | ICD-10-CM | POA: Diagnosis not present

## 2016-03-18 DIAGNOSIS — J449 Chronic obstructive pulmonary disease, unspecified: Secondary | ICD-10-CM | POA: Diagnosis not present

## 2016-04-09 ENCOUNTER — Other Ambulatory Visit: Payer: Self-pay | Admitting: *Deleted

## 2016-04-09 DIAGNOSIS — J439 Emphysema, unspecified: Secondary | ICD-10-CM

## 2016-04-09 MED ORDER — ACETAZOLAMIDE 250 MG PO TABS: 250.0000 mg | ORAL_TABLET | Freq: Three times a day (TID) | ORAL | 0 refills | Status: DC

## 2016-04-10 ENCOUNTER — Ambulatory Visit: Payer: Self-pay | Admitting: Internal Medicine

## 2016-04-13 DIAGNOSIS — J449 Chronic obstructive pulmonary disease, unspecified: Secondary | ICD-10-CM | POA: Diagnosis not present

## 2016-04-17 ENCOUNTER — Ambulatory Visit (INDEPENDENT_AMBULATORY_CARE_PROVIDER_SITE_OTHER): Payer: PPO | Admitting: Internal Medicine

## 2016-04-17 VITALS — BP 108/70 | HR 72 | Temp 97.5°F | Resp 16 | Ht 64.5 in | Wt 254.6 lb

## 2016-04-17 DIAGNOSIS — Z0001 Encounter for general adult medical examination with abnormal findings: Secondary | ICD-10-CM

## 2016-04-17 DIAGNOSIS — E559 Vitamin D deficiency, unspecified: Secondary | ICD-10-CM | POA: Diagnosis not present

## 2016-04-17 DIAGNOSIS — E785 Hyperlipidemia, unspecified: Secondary | ICD-10-CM

## 2016-04-17 DIAGNOSIS — Z0000 Encounter for general adult medical examination without abnormal findings: Secondary | ICD-10-CM

## 2016-04-17 DIAGNOSIS — E1122 Type 2 diabetes mellitus with diabetic chronic kidney disease: Secondary | ICD-10-CM

## 2016-04-17 DIAGNOSIS — I10 Essential (primary) hypertension: Secondary | ICD-10-CM | POA: Diagnosis not present

## 2016-04-17 DIAGNOSIS — D509 Iron deficiency anemia, unspecified: Secondary | ICD-10-CM

## 2016-04-17 DIAGNOSIS — J449 Chronic obstructive pulmonary disease, unspecified: Secondary | ICD-10-CM

## 2016-04-17 DIAGNOSIS — R6889 Other general symptoms and signs: Secondary | ICD-10-CM | POA: Diagnosis not present

## 2016-04-17 DIAGNOSIS — K219 Gastro-esophageal reflux disease without esophagitis: Secondary | ICD-10-CM | POA: Diagnosis not present

## 2016-04-17 DIAGNOSIS — N183 Chronic kidney disease, stage 3 (moderate): Secondary | ICD-10-CM

## 2016-04-17 DIAGNOSIS — E039 Hypothyroidism, unspecified: Secondary | ICD-10-CM | POA: Diagnosis not present

## 2016-04-17 DIAGNOSIS — I5032 Chronic diastolic (congestive) heart failure: Secondary | ICD-10-CM

## 2016-04-17 DIAGNOSIS — Z86010 Personal history of colonic polyps: Secondary | ICD-10-CM

## 2016-04-17 DIAGNOSIS — Z720 Tobacco use: Secondary | ICD-10-CM

## 2016-04-17 DIAGNOSIS — I471 Supraventricular tachycardia: Secondary | ICD-10-CM

## 2016-04-17 DIAGNOSIS — Z79899 Other long term (current) drug therapy: Secondary | ICD-10-CM

## 2016-04-17 NOTE — Progress Notes (Signed)
MEDICARE ANNUAL WELLNESS VISIT AND FOLLOW UP  Assessment:    1. Essential hypertension -cont current meds -well controlled -dash diet -monitor at home -ER if CP,SOB, weakness  2. Paroxysmal SVT (supraventricular tachycardia) (HCC) -cont rate control -followed by Cardiology  3. Chronic diastolic HF (heart failure), with recent mild exacerbation -cont diuretic and follow with cards  4. Chronic obstructive pulmonary disease, unspecified COPD type (HCC) -oxygen dependent -cont diamox -cont inhalers/nebulizer  5. Gastroesophageal reflux disease, esophagitis presence not specified -cont meds -avoid trigger foods  6. Type 2 diabetes mellitus with stage 3 chronic kidney disease, without long-term current use of insulin (HCC) -cont medications -up to date on eye exam -foot exam UTD  7. Hypothyroidism, unspecified hypothyroidism type -cont levothyroxine  8. Morbid obesity, unspecified obesity type (HCC) -cont diet -exercise prohibited secondary to cardiopulmonary disease  9. IRON DEFICIENCY -cont slow release iron  10. Personal history of colonic polyps -UTD on colonoscopy -due in 2021  11. Hyperlipidemia -cont statin   12. Vitamin D deficiency -cont Vit D supplement  13. Medication management -cont meds  14. Tobacco abuse -recommended quitting  Over 30 minutes of exam, counseling, chart review, and critical decision making was performed  Future Appointments Date Time Provider Department Center  05/22/2016 10:30 AM Runell Gess, MD [facility]  05/31/2016 10:30 AM Terri Piedra, PA-C [facility] None  11/07/2016 3:00 PM Lucky Cowboy, MD [facility] None    Plan:   During the course of the visit the patient was educated and counseled about appropriate screening and preventive services including:    Pneumococcal vaccine   Influenza vaccine  Td vaccine  Prevnar 13  Screening electrocardiogram  Screening mammography  Bone  densitometry screening  Colorectal cancer screening  Diabetes screening  Glaucoma screening  Nutrition counseling   Advanced directives: given info/requested copies   Subjective:   Jillian Wood is a 70 y.o. female who presents for Medicare Annual Wellness Visit and 3 month follow up on hypertension, diabetes, hyperlipidemia, vitamin D def.   Her blood pressure has been controlled at home, today their BP is BP: 108/70 She does not workout. She denies chest pain, shortness of breath, dizziness.  She is on cholesterol medication and denies myalgias. Her cholesterol is at goal. The cholesterol last visit was:   Lab Results  Component Value Date   CHOL 156 02/29/2016   HDL 76 02/29/2016   LDLCALC 57 02/29/2016   TRIG 117 02/29/2016   CHOLHDL 2.1 02/29/2016   She is working on her diet.  She is taking her medications.  She cannot exercise secondary to her respiratory and cardiac condition.  She does not check CBGs. :  Lab Results  Component Value Date   HGBA1C 5.7 (H) 02/29/2016   Last GFR Lab Results  Component Value Date   [facility] >89 02/29/2016   Lab Results  Component Value Date   GFRAA >89 02/29/2016   Patient is on Vitamin D supplement. Lab Results  Component Value Date   VD25OH 45 02/29/2016      Medication Review Current Outpatient Prescriptions on File Prior to Visit  Medication Sig Dispense Refill  . acetaminophen-codeine (TYLENOL #3) 300-30 MG per tablet Take 1 tablet by mouth every 8 (eight) hours as needed for moderate pain or severe pain. 90 tablet 0  . acetaZOLAMIDE (DIAMOX) 250 MG tablet Take 1 tablet (250 mg total) by mouth 3 (three) times daily. 270 tablet 0  . albuterol (PROVENTIL HFA;VENTOLIN HFA) 108 (90 BASE) MCG/ACT inhaler Inhale  1-2 puffs into the lungs every 6 (six) hours as needed for wheezing or shortness of breath. 18 g 2  . aspirin 81 MG tablet Take 81 mg by mouth daily.    . budesonide-formoterol (SYMBICORT) 160-4.5 MCG/ACT inhaler  2 puffs twice a day, wash mouth afterwards 1 Inhaler 12  . CHANTIX CONTINUING MONTH PAK 1 MG tablet TAKE 1/2 TO 1 TABLET 2 X A DAY AS DIRECTED FOR SMOKING CESSATION 56 tablet 2  . Cholecalciferol (VITAMIN D PO) Take 10,000 Units by mouth daily.    . diazepam (VALIUM) 5 MG tablet Take 1 tab 3 times a day for anxiety. 90 tablet 2  . diltiazem (CARDIZEM) 60 MG tablet TAKE 1 TABLET (60 MG TOTAL) BY MOUTH 2 (TWO) TIMES DAILY. 180 tablet 3  . DULoxetine (CYMBALTA) 60 MG capsule TAKE 1 CAPSULE BY MOUTH DAILY 90 capsule 0  . famotidine (PEPCID) 20 MG tablet TAKE 1 TABLET (20 MG TOTAL) BY MOUTH 2 (TWO) TIMES DAILY. FOR ACID REFLUX 180 tablet 0  . furosemide (LASIX) 80 MG tablet Take 1/2 tab daily for BP and fluid. 45 tablet 0  . glucose blood test strip Check blood sugar 1 time daily-DX-E11.22 100 each 4  . glucose monitoring kit (FREESTYLE) monitoring kit Check blood sugar 1 time daily-DX-E11.22 1 each 0  . guaiFENesin (MUCINEX) 600 MG 12 hr tablet Take 600 mg by mouth daily.    . Lancets (FREESTYLE) lancets Check blood sugar 1 time daily-DX-11.22 100 each 4  . levothyroxine (SYNTHROID) 112 MCG tablet Take 1 tablet (112 mcg total) by mouth daily. 90 tablet 1  . loratadine-pseudoephedrine (CLARITIN-D 24-HOUR) 10-240 MG per 24 hr tablet Take 1 tablet by mouth as needed.     Marland Kitchen losartan (COZAAR) 50 MG tablet TAKE 1 TABLET (50 MG TOTAL) BY MOUTH DAILY. 90 tablet 3  . losartan (COZAAR) 50 MG tablet TAKE 1 TABLET (50 MG TOTAL) BY MOUTH DAILY. 90 tablet 3  . Magnesium Chloride (MAGNESIUM DR PO) Take 1 capsule by mouth daily.     . metFORMIN (GLUCOPHAGE-XR) 500 MG 24 hr tablet TAKE 1 TABLET (500 MG TOTAL) BY MOUTH 4 (FOUR) TIMES DAILY - AFTER MEALS AND AT BEDTIME. 360 tablet 1  . OXYGEN-HELIUM IN Inhale 3 L into the lungs.     . pravastatin (PRAVACHOL) 40 MG tablet TAKE 1 TABLET (40 MG TOTAL) BY MOUTH DAILY. 90 tablet 1   No current facility-administered medications on file prior to visit.      Allergies: Allergies  Allergen Reactions  . Ace Inhibitors     cough  . Inderal [Propranolol]     Hair loss    Current Problems (verified) has IRON DEFICIENCY; Personal history of colonic polyps; Essential hypertension; Hyperlipidemia; Morbid obesity ([place]); Vitamin D deficiency; T2_NIDDM w/Stage 3 CKD (GFR 72 ml/min); Medication management; Hypothyroidism; Paroxysmal SVT (supraventricular tachycardia) (HCC); COPD (chronic obstructive pulmonary disease) ([place]); Tobacco abuse; Chronic diastolic HF (heart failure), with recent mild exacerbation; and GERD (gastroesophageal reflux disease) on her problem list.  Screening Tests Immunization History  Administered Date(s) Administered  . Influenza, High Dose Seasonal PF 08/16/2014, 07/27/2015  . Pneumococcal Conjugate-13 07/27/2015  . Pneumococcal-Unspecified 05/16/2011  . Tdap 08/16/2008    Preventative care: Last colonoscopy: Cologuard Last mammogram: 2016 Last pap smear/pelvic exam:  9/16  DEXA: 2015  Prior vaccinations: TD or Tdap: 2009  Influenza: 2016  Pneumococcal: 2012 Prevnar13: 2016 Shingles/Zostavax: Declined  Names of Other Physician/Practitioners you currently use: 1. [facility]- here  for primary care 2. Dr. Renaldo Fiddler, eye doctor, last visit 05/17/16 scheduled 3. Dr.  , dentist, last visit  Patient Care Team: Unk Pinto, MD as PCP - General (Internal Medicine) Evans Lance, MD as Consulting Physician (Cardiology) Lorretta Harp, MD as Consulting Physician (Cardiology) Inda Castle, MD as Consulting Physician (Gastroenterology) Wallene Huh, DPM as Consulting Physician (Podiatry)  Surgical: She  has a past surgical history that includes Carpal tunnel release; Eye surgery (Bilateral); Pubovaginal sling; Tonsillectomy and adenoidectomy; transthoracic echocardiogram (01/2011); and [place] MYOVIEW LTD (01/2011). Family Her family history includes Alcohol abuse in her  father; Heart disease in her father. Social history  She reports that she has been smoking Cigarettes.  She has a 22.00 pack-year smoking history. She has never used smokeless tobacco. She reports that she does not drink alcohol or use drugs.  MEDICARE WELLNESS OBJECTIVES: Physical activity: Current Exercise Habits: The patient does not participate in regular exercise at present, Exercise limited by: cardiac condition(s);respiratory conditions(s) Cardiac risk factors: Cardiac Risk Factors include: advanced age (>7mn, >>66women);hypertension;diabetes mellitus;dyslipidemia;obesity (BMI >30kg/m2);smoking/ tobacco exposure;sedentary lifestyle Depression/mood screen:   Dep[facility] 04/17/2016  Decreased Interest 0  Down, Depressed, Hopeless 0  PHQ - 2 Score 0    ADLs:  In your present state of health, do you have any difficulty performing the following activities: 04/17/2016 02/29/2016  Hearing? N N  Vision? N N  Difficulty concentrating or making decisions? N N  Walking or climbing stairs? Y N  Dressing or bathing? Y N  Doing errands, shopping? - N  PConservation officer, natureand eating ? Y -  Using the Toilet? N -  In the past six months, have you accidently leaked urine? N -  Do you have problems with loss of bowel control? N -  Managing your Medications? N -  Managing your Finances? Y -  Housekeeping or managing your Housekeeping? Y -  Some recent data might be hidden     Cognitive Testing  Alert? Yes  Normal Appearance?Yes  Oriented to person? Yes  Place? Yes   Time? Yes  Recall of three objects?  Yes  Can perform simple calculations? Yes  Displays appropriate judgment?Yes  Can read the correct time from a watch face?Yes  EOL planning: Does patient have an advance directive?: No Would patient like information on creating an advanced directive?: Yes - Educational materials given   Objective:   Today's Vitals   04/17/16 1410  BP: 108/70  Pulse: 72  Resp: 16  Temp: 97.5  F (36.4 C)  Weight: 254 lb 9.6 oz (115.5 kg)  Height: 5' 4.5" (1.638 m)   Body mass index is 43.03 kg/m.  General appearance: alert, no distress, WD/WN,  female HEENT: normocephalic, sclerae anicteric, TMs pearly, nares patent, no discharge or erythema, pharynx normal Oral cavity: MMM, no lesions Neck: supple, no lymphadenopathy, no thyromegaly, no masses Heart: RRR, normal S1, S2, no murmurs Lungs: CTA bilaterally, no wheezes, rhonchi, or rales Abdomen: +bs, soft, non tender, non distended, no masses, no hepatomegaly, no splenomegaly Musculoskeletal: nontender, no swelling, no obvious deformity Extremities: no edema, no cyanosis, no clubbing Pulses: 2+ symmetric, upper and lower extremities, normal cap refill Neurological: alert, oriented x 3, CN2-12 intact, strength normal upper extremities and lower extremities, sensation normal throughout, DTRs 2+ throughout, no cerebellar signs, gait normal Psychiatric: normal affect, behavior normal, pleasant  Breast: defer Gyn: defer Rectal: defer   Medicare Attestation I have personally reviewed: The patient's medical and social history  Their use of alcohol, tobacco or illicit drugs Their current medications and supplements The patient's functional ability including ADLs,fall risks, home safety risks, cognitive, and hearing and visual impairment Diet and physical activities Evidence for depression or mood disorders  The patient's weight, height, BMI, and visual acuity have been recorded in the chart.  I have made referrals, counseling, and provided education to the patient based on review of the above and I have provided the patient with a written personalized care plan for preventive services.     Starlyn Skeans, PA-C   04/17/2016

## 2016-04-18 ENCOUNTER — Ambulatory Visit: Payer: Self-pay | Admitting: Internal Medicine

## 2016-04-18 DIAGNOSIS — J449 Chronic obstructive pulmonary disease, unspecified: Secondary | ICD-10-CM | POA: Diagnosis not present

## 2016-04-27 ENCOUNTER — Other Ambulatory Visit: Payer: Self-pay | Admitting: Internal Medicine

## 2016-05-03 ENCOUNTER — Other Ambulatory Visit: Payer: Self-pay | Admitting: Internal Medicine

## 2016-05-14 DIAGNOSIS — J449 Chronic obstructive pulmonary disease, unspecified: Secondary | ICD-10-CM | POA: Diagnosis not present

## 2016-05-16 ENCOUNTER — Other Ambulatory Visit: Payer: Self-pay | Admitting: *Deleted

## 2016-05-16 MED ORDER — LEVOTHYROXINE SODIUM 112 MCG PO TABS: ORAL_TABLET | ORAL | 1 refills | Status: DC

## 2016-05-19 DIAGNOSIS — J449 Chronic obstructive pulmonary disease, unspecified: Secondary | ICD-10-CM | POA: Diagnosis not present

## 2016-05-22 ENCOUNTER — Ambulatory Visit: Payer: PPO | Admitting: Cardiovascular Disease

## 2016-05-31 ENCOUNTER — Ambulatory Visit: Payer: Self-pay | Admitting: Internal Medicine

## 2016-06-03 ENCOUNTER — Ambulatory Visit: Payer: Self-pay | Admitting: Internal Medicine

## 2016-06-06 ENCOUNTER — Encounter: Payer: Self-pay | Admitting: Internal Medicine

## 2016-06-06 ENCOUNTER — Ambulatory Visit (INDEPENDENT_AMBULATORY_CARE_PROVIDER_SITE_OTHER): Payer: PPO | Admitting: Internal Medicine

## 2016-06-06 VITALS — BP 128/70 | HR 58 | Temp 98.4°F | Resp 16 | Ht 64.5 in | Wt 246.0 lb

## 2016-06-06 DIAGNOSIS — E559 Vitamin D deficiency, unspecified: Secondary | ICD-10-CM

## 2016-06-06 DIAGNOSIS — N183 Chronic kidney disease, stage 3 unspecified: Secondary | ICD-10-CM

## 2016-06-06 DIAGNOSIS — E785 Hyperlipidemia, unspecified: Secondary | ICD-10-CM | POA: Diagnosis not present

## 2016-06-06 DIAGNOSIS — Z79899 Other long term (current) drug therapy: Secondary | ICD-10-CM

## 2016-06-06 DIAGNOSIS — E1122 Type 2 diabetes mellitus with diabetic chronic kidney disease: Secondary | ICD-10-CM | POA: Diagnosis not present

## 2016-06-06 DIAGNOSIS — Z23 Encounter for immunization: Secondary | ICD-10-CM

## 2016-06-06 DIAGNOSIS — E039 Hypothyroidism, unspecified: Secondary | ICD-10-CM | POA: Diagnosis not present

## 2016-06-06 DIAGNOSIS — I10 Essential (primary) hypertension: Secondary | ICD-10-CM

## 2016-06-06 DIAGNOSIS — J449 Chronic obstructive pulmonary disease, unspecified: Secondary | ICD-10-CM

## 2016-06-06 DIAGNOSIS — IMO0001 Reserved for inherently not codable concepts without codable children: Secondary | ICD-10-CM

## 2016-06-06 LAB — HEPATIC FUNCTION PANEL
ALK PHOS: 75 U/L (ref 33–130)
ALT: 7 U/L (ref 6–29)
AST: 13 U/L (ref 10–35)
Albumin: 4.1 g/dL (ref 3.6–5.1)
BILIRUBIN DIRECT: 0.1 mg/dL (ref <=0.2)
BILIRUBIN INDIRECT: 0.2 mg/dL (ref 0.2–1.2)
BILIRUBIN TOTAL: 0.3 mg/dL (ref 0.2–1.2)
Total Protein: 6.6 g/dL (ref 6.1–8.1)

## 2016-06-06 LAB — LIPID PANEL
CHOL/HDL RATIO: 2.4 ratio (ref <=5.0)
Cholesterol: 180 mg/dL (ref 125–200)
HDL: 75 mg/dL (ref >=46)
LDL CALC: 68 mg/dL (ref <130)
Triglycerides: 187 mg/dL — ABNORMAL HIGH (ref <150)
VLDL: 37 mg/dL — AB (ref <30)

## 2016-06-06 LAB — CBC WITH DIFFERENTIAL/PLATELET
BASOS ABS: 0 {cells}/uL (ref 0–200)
BASOS PCT: 0 %
EOS PCT: 1 %
Eosinophils Absolute: 70 cells/uL (ref 15–500)
HCT: 39.4 % (ref 35.0–45.0)
HEMOGLOBIN: 11.7 g/dL (ref 11.7–15.5)
LYMPHS ABS: 1540 {cells}/uL (ref 850–3900)
Lymphocytes Relative: 22 %
MCH: 25.8 pg — AB (ref 27.0–33.0)
MCHC: 29.7 g/dL — ABNORMAL LOW (ref 32.0–36.0)
MCV: 87 fL (ref 80.0–100.0)
MPV: 9.3 fL (ref 7.5–12.5)
Monocytes Absolute: 490 cells/uL (ref 200–950)
Monocytes Relative: 7 %
NEUTROS ABS: 4900 {cells}/uL (ref 1500–7800)
Neutrophils Relative %: 70 %
Platelets: 239 10*3/uL (ref 140–400)
RBC: 4.53 MIL/uL (ref 3.80–5.10)
RDW: 14.2 % (ref 11.0–15.0)
WBC: 7 10*3/uL (ref 3.8–10.8)

## 2016-06-06 LAB — BASIC METABOLIC PANEL WITH GFR
BUN: 15 mg/dL (ref 7–25)
CHLORIDE: 95 mmol/L — AB (ref 98–110)
CO2: 35 mmol/L — AB (ref 20–31)
Calcium: 9.4 mg/dL (ref 8.6–10.4)
Creat: 0.77 mg/dL (ref 0.60–0.93)
GFR, EST NON AFRICAN AMERICAN: 78 mL/min (ref >=60)
GFR, Est African American: >89 mL/min (ref >=60)
Glucose, Bld: 116 mg/dL — ABNORMAL HIGH (ref 65–99)
POTASSIUM: 4.5 mmol/L (ref 3.5–5.3)
SODIUM: 141 mmol/L (ref 135–146)

## 2016-06-06 LAB — TSH: TSH: 6.25 m[IU]/L — AB

## 2016-06-06 MED ORDER — ALBUTEROL SULFATE HFA 108 (90 BASE) MCG/ACT IN AERS
1.0000 | INHALATION_SPRAY | Freq: Four times a day (QID) | RESPIRATORY_TRACT | 2 refills | Status: DC | PRN
Start: 2016-06-06 — End: 2017-05-30

## 2016-06-06 MED ORDER — GLUCOSE BLOOD VI STRP: ORAL_STRIP | 4 refills | Status: DC

## 2016-06-06 NOTE — Progress Notes (Signed)
Assessment and Plan:  Hypertension:  -Continue medication -monitor blood pressure at home. -Continue DASH diet -Reminder to go to the ER if any CP, SOB, nausea, dizziness, severe HA, changes vision/speech, left arm numbness and tingling and jaw pain.  Cholesterol - Continue diet and exercise -Check cholesterol.   Diabetes with diabetic chronic kidney disease  -well controlled with current medications -consider stopping metformin at next visit. -new test strips sent in -Continue diet and exercise.  -Check A1C  Vitamin D Def -check level -continue medications.   COPD oxygen dependent -cont O2 -cont inhalers  Hypothyroidism -tsh -cont levothyroxine  Need for colon cancer screening -referral placed to Dr. Havery Moros -given past history of multiple polyps not a good candidate for cologuard  Continue diet and meds as discussed. Further disposition pending results of labs. Discussed med's effects and SE's.    HPI 70 y.o. female  presents for 3 month follow up with hypertension, hyperlipidemia, diabetes and vitamin D deficiency.   Her blood pressure has been controlled at home, today their BP is  .She does not workout. She denies chest pain, shortness of breath, dizziness.   She is on cholesterol medication and denies myalgias. Her cholesterol is at goal. The cholesterol was:  02/29/2016: Cholesterol 156; HDL 76; LDL Cholesterol 57; Triglycerides 117   She has been working on diet and exercise for diabetes with diabetic chronic kidney disease, she is on bASA, she is on ACE/ARB, and denies  foot ulcerations, hyperglycemia, hypoglycemia , increased appetite, nausea, paresthesia of the feet, polydipsia, polyuria, visual disturbances, vomiting and weight loss. Last A1C was: 02/29/2016: Hgb A1c MFr Bld 5.7   Patient is on Vitamin D supplement. 02/29/2016: Vit D, 25-Hydroxy 45  She reports that she has not had her colon     Current Medications:  Current Outpatient Prescriptions on  File Prior to Visit  Medication Sig Dispense Refill  . acetaminophen-codeine (TYLENOL #3) 300-30 MG per tablet Take 1 tablet by mouth every 8 (eight) hours as needed for moderate pain or severe pain. 90 tablet 0  . acetaZOLAMIDE (DIAMOX) 250 MG tablet Take 1 tablet (250 mg total) by mouth 3 (three) times daily. 270 tablet 0  . albuterol (PROVENTIL HFA;VENTOLIN HFA) 108 (90 BASE) MCG/ACT inhaler Inhale 1-2 puffs into the lungs every 6 (six) hours as needed for wheezing or shortness of breath. 18 g 2  . aspirin 81 MG tablet Take 81 mg by mouth daily.    . budesonide-formoterol (SYMBICORT) 160-4.5 MCG/ACT inhaler 2 puffs twice a day, wash mouth afterwards 1 Inhaler 12  . CHANTIX CONTINUING MONTH PAK 1 MG tablet TAKE 1/2 TO 1 TABLET 2 X A DAY AS DIRECTED FOR SMOKING CESSATION 56 tablet 2  . Cholecalciferol (VITAMIN D PO) Take 10,000 Units by mouth daily.    . diazepam (VALIUM) 5 MG tablet Take 1 tab 3 times a day for anxiety. 90 tablet 2  . diltiazem (CARDIZEM) 60 MG tablet TAKE 1 TABLET (60 MG TOTAL) BY MOUTH 2 (TWO) TIMES DAILY. 180 tablet 3  . DULoxetine (CYMBALTA) 60 MG capsule TAKE 1 CAPSULE BY MOUTH DAILY 90 capsule 0  . famotidine (PEPCID) 20 MG tablet TAKE 1 TABLET (20 MG TOTAL) BY MOUTH 2 (TWO) TIMES DAILY. FOR ACID REFLUX 180 tablet 0  . furosemide (LASIX) 80 MG tablet Take 1/2 tab daily for BP and fluid. 45 tablet 0  . glucose blood test strip Check blood sugar 1 time daily-DX-E11.22 100 each 4  . glucose monitoring kit (FREESTYLE) monitoring  kit Check blood sugar 1 time daily-DX-E11.22 1 each 0  . guaiFENesin (MUCINEX) 600 MG 12 hr tablet Take 600 mg by mouth daily.    . Lancets (FREESTYLE) lancets Check blood sugar 1 time daily-DX-11.22 100 each 4  . levothyroxine (SYNTHROID) 112 MCG tablet Takes 1 tablet x 5 days and 1 1/2 tablet x 2 days. 96 tablet 1  . loratadine-pseudoephedrine (CLARITIN-D 24-HOUR) 10-240 MG per 24 hr tablet Take 1 tablet by mouth as needed.     Marland Kitchen losartan (COZAAR)  50 MG tablet TAKE 1 TABLET (50 MG TOTAL) BY MOUTH DAILY. 90 tablet 3  . losartan (COZAAR) 50 MG tablet TAKE 1 TABLET (50 MG TOTAL) BY MOUTH DAILY. 90 tablet 3  . Magnesium Chloride (MAGNESIUM DR PO) Take 1 capsule by mouth daily.     . metFORMIN (GLUCOPHAGE-XR) 500 MG 24 hr tablet TAKE 1 TABLET (500 MG TOTAL) BY MOUTH 4 (FOUR) TIMES DAILY - AFTER MEALS AND AT BEDTIME. 360 tablet 1  . OXYGEN-HELIUM IN Inhale 3 L into the lungs.     . pravastatin (PRAVACHOL) 40 MG tablet TAKE 1 TABLET (40 MG TOTAL) BY MOUTH DAILY. 90 tablet 1   No current facility-administered medications on file prior to visit.    Medical History:  Past Medical History:  Diagnosis Date  . Arthritis   . COPD (chronic obstructive pulmonary disease) ([place])   . Depression   . Diabetes mellitus type 2 in obese ([place])   . GERD (gastroesophageal reflux disease)   . Hyperlipidemia   . Hypertension   . Morbid obesity ([place])   . OAB (overactive bladder)   . PSVT (paroxysmal supraventricular tachycardia) ([place])   . Thyroid disease   . Vitamin D deficiency    Allergies:  Allergies  Allergen Reactions  . Ace Inhibitors     cough  . Inderal [Propranolol]     Hair loss     Review of Systems:  Review of Systems  Constitutional: Negative for chills, fever and malaise/fatigue.  HENT: Negative for congestion, ear pain and sore throat.   Eyes: Negative.   Respiratory: Negative for cough, shortness of breath and wheezing.   Cardiovascular: Negative for chest pain, palpitations and leg swelling.  Gastrointestinal: Negative for abdominal pain, blood in stool, constipation, diarrhea, heartburn and melena.  Genitourinary: Negative.   Skin: Negative.   Neurological: Negative for dizziness, sensory change, loss of consciousness and headaches.  Psychiatric/Behavioral: Negative for depression. The patient is not nervous/anxious and does not have insomnia.     Family history- Review and unchanged  Social history- Review and  unchanged  Physical Exam: Ht 5' 4.5" (1.638 m)  Wt Readings from Last 3 Encounters:  04/17/16 254 lb 9.6 oz (115.5 kg)  02/29/16 250 lb 3.2 oz (113.5 kg)  11/23/15 223 lb (101.2 kg)   General Appearance: Appears older than stated age, Well nourished well developed, non-toxic appearing, in no apparent distress. [place] O2 in place Eyes: PERRLA, EOMs, conjunctiva no swelling or erythema ENT/Mouth: Ear canals clear with no erythema, swelling, or discharge.  TMs normal bilaterally, oropharynx clear, moist, with no exudate.   Neck: Supple, thyroid normal, no JVD, no cervical adenopathy.  Respiratory: Respiratory effort normal, breath sounds clear A&P, Scattered end expiratory wheeze, rhonchi or rales noted.  No retractions, no accessory muscle usage Cardio: RRR with no MRGs. No noted edema.  Abdomen: Soft, + BS.  Non tender, no guarding, rebound, hernias, masses. Musculoskeletal: Full ROM, 5/5 strength, Normal gait Skin: Warm, dry without  rashes, lesions, ecchymosis.  Neuro: Awake and oriented X 3, Cranial nerves intact. No cerebellar symptoms.  Psych: normal affect, Insight and Judgment appropriate.    Starlyn Skeans, PA-C 2:52 PM [facility]

## 2016-06-07 ENCOUNTER — Other Ambulatory Visit: Payer: Self-pay | Admitting: Internal Medicine

## 2016-06-07 ENCOUNTER — Other Ambulatory Visit: Payer: Self-pay | Admitting: *Deleted

## 2016-06-07 LAB — HEMOGLOBIN A1C
Hgb A1c MFr Bld: 5.2 % (ref <5.7)
MEAN PLASMA GLUCOSE: 103 mg/dL

## 2016-06-07 MED ORDER — GLUCOSE BLOOD VI STRP: ORAL_STRIP | 12 refills | Status: DC

## 2016-06-07 MED ORDER — LEVOTHYROXINE SODIUM 175 MCG PO TABS: 175.0000 ug | ORAL_TABLET | Freq: Every day | ORAL | 0 refills | Status: DC

## 2016-06-14 DIAGNOSIS — J449 Chronic obstructive pulmonary disease, unspecified: Secondary | ICD-10-CM | POA: Diagnosis not present

## 2016-06-17 ENCOUNTER — Other Ambulatory Visit: Payer: Self-pay | Admitting: Internal Medicine

## 2016-06-18 DIAGNOSIS — J449 Chronic obstructive pulmonary disease, unspecified: Secondary | ICD-10-CM | POA: Diagnosis not present

## 2016-06-25 ENCOUNTER — Ambulatory Visit: Payer: PPO | Admitting: Cardiovascular Disease

## 2016-07-01 ENCOUNTER — Ambulatory Visit: Payer: Self-pay

## 2016-07-10 ENCOUNTER — Ambulatory Visit: Payer: Self-pay

## 2016-07-11 ENCOUNTER — Ambulatory Visit: Payer: Self-pay

## 2016-07-14 DIAGNOSIS — J449 Chronic obstructive pulmonary disease, unspecified: Secondary | ICD-10-CM | POA: Diagnosis not present

## 2016-07-16 ENCOUNTER — Ambulatory Visit: Payer: Self-pay

## 2016-07-19 DIAGNOSIS — J449 Chronic obstructive pulmonary disease, unspecified: Secondary | ICD-10-CM | POA: Diagnosis not present

## 2016-07-22 ENCOUNTER — Ambulatory Visit: Payer: Self-pay

## 2016-07-24 ENCOUNTER — Ambulatory Visit: Payer: Self-pay

## 2016-07-29 ENCOUNTER — Encounter: Payer: Self-pay | Admitting: Physician Assistant

## 2016-07-31 ENCOUNTER — Ambulatory Visit: Payer: Self-pay | Admitting: Physician Assistant

## 2016-07-31 ENCOUNTER — Encounter (HOSPITAL_COMMUNITY): Payer: Self-pay | Admitting: Emergency Medicine

## 2016-07-31 ENCOUNTER — Ambulatory Visit (INDEPENDENT_AMBULATORY_CARE_PROVIDER_SITE_OTHER): Payer: PPO | Admitting: Physician Assistant

## 2016-07-31 ENCOUNTER — Encounter: Payer: Self-pay | Admitting: Physician Assistant

## 2016-07-31 ENCOUNTER — Inpatient Hospital Stay (HOSPITAL_COMMUNITY)
Admission: EM | Admit: 2016-07-31 | Discharge: 2016-08-11 | DRG: 291 | Disposition: A | Discharge status: Home / Self Care | Payer: PPO | Attending: Family Medicine | Admitting: Family Medicine

## 2016-07-31 ENCOUNTER — Emergency Department (HOSPITAL_COMMUNITY): Payer: PPO

## 2016-07-31 VITALS — BP 140/72 | HR 87 | Temp 97.1°F | Resp 16 | Ht 64.5 in | Wt 254.2 lb

## 2016-07-31 DIAGNOSIS — Z9981 Dependence on supplemental oxygen: Secondary | ICD-10-CM

## 2016-07-31 DIAGNOSIS — E876 Hypokalemia: Secondary | ICD-10-CM | POA: Diagnosis present

## 2016-07-31 DIAGNOSIS — G4733 Obstructive sleep apnea (adult) (pediatric): Secondary | ICD-10-CM | POA: Diagnosis present

## 2016-07-31 DIAGNOSIS — F17210 Nicotine dependence, cigarettes, uncomplicated: Secondary | ICD-10-CM | POA: Diagnosis present

## 2016-07-31 DIAGNOSIS — J441 Chronic obstructive pulmonary disease with (acute) exacerbation: Secondary | ICD-10-CM | POA: Diagnosis present

## 2016-07-31 DIAGNOSIS — I248 Other forms of acute ischemic heart disease: Secondary | ICD-10-CM | POA: Diagnosis not present

## 2016-07-31 DIAGNOSIS — I5032 Chronic diastolic (congestive) heart failure: Secondary | ICD-10-CM

## 2016-07-31 DIAGNOSIS — I420 Dilated cardiomyopathy: Secondary | ICD-10-CM | POA: Diagnosis present

## 2016-07-31 DIAGNOSIS — Z7982 Long term (current) use of aspirin: Secondary | ICD-10-CM

## 2016-07-31 DIAGNOSIS — I10 Essential (primary) hypertension: Secondary | ICD-10-CM | POA: Diagnosis not present

## 2016-07-31 DIAGNOSIS — J9621 Acute and chronic respiratory failure with hypoxia: Secondary | ICD-10-CM | POA: Diagnosis not present

## 2016-07-31 DIAGNOSIS — Z888 Allergy status to other drugs, medicaments and biological substances status: Secondary | ICD-10-CM

## 2016-07-31 DIAGNOSIS — I130 Hypertensive heart and chronic kidney disease with heart failure and stage 1 through stage 4 chronic kidney disease, or unspecified chronic kidney disease: Principal | ICD-10-CM | POA: Diagnosis present

## 2016-07-31 DIAGNOSIS — Z720 Tobacco use: Secondary | ICD-10-CM | POA: Diagnosis not present

## 2016-07-31 DIAGNOSIS — Z8249 Family history of ischemic heart disease and other diseases of the circulatory system: Secondary | ICD-10-CM

## 2016-07-31 DIAGNOSIS — J9601 Acute respiratory failure with hypoxia: Secondary | ICD-10-CM | POA: Diagnosis not present

## 2016-07-31 DIAGNOSIS — I4891 Unspecified atrial fibrillation: Secondary | ICD-10-CM | POA: Diagnosis not present

## 2016-07-31 DIAGNOSIS — Z9119 Patient's noncompliance with other medical treatment and regimen: Secondary | ICD-10-CM

## 2016-07-31 DIAGNOSIS — I350 Nonrheumatic aortic (valve) stenosis: Secondary | ICD-10-CM | POA: Diagnosis present

## 2016-07-31 DIAGNOSIS — R7309 Other abnormal glucose: Secondary | ICD-10-CM | POA: Diagnosis present

## 2016-07-31 DIAGNOSIS — F329 Major depressive disorder, single episode, unspecified: Secondary | ICD-10-CM | POA: Diagnosis present

## 2016-07-31 DIAGNOSIS — I471 Supraventricular tachycardia: Secondary | ICD-10-CM | POA: Diagnosis present

## 2016-07-31 DIAGNOSIS — Z7984 Long term (current) use of oral hypoglycemic drugs: Secondary | ICD-10-CM

## 2016-07-31 DIAGNOSIS — I483 Typical atrial flutter: Secondary | ICD-10-CM | POA: Diagnosis not present

## 2016-07-31 DIAGNOSIS — N3281 Overactive bladder: Secondary | ICD-10-CM | POA: Diagnosis not present

## 2016-07-31 DIAGNOSIS — I509 Heart failure, unspecified: Secondary | ICD-10-CM | POA: Diagnosis not present

## 2016-07-31 DIAGNOSIS — E039 Hypothyroidism, unspecified: Secondary | ICD-10-CM | POA: Diagnosis present

## 2016-07-31 DIAGNOSIS — I4892 Unspecified atrial flutter: Secondary | ICD-10-CM | POA: Diagnosis present

## 2016-07-31 DIAGNOSIS — I5033 Acute on chronic diastolic (congestive) heart failure: Secondary | ICD-10-CM | POA: Diagnosis present

## 2016-07-31 DIAGNOSIS — Z79899 Other long term (current) drug therapy: Secondary | ICD-10-CM

## 2016-07-31 DIAGNOSIS — J449 Chronic obstructive pulmonary disease, unspecified: Secondary | ICD-10-CM | POA: Diagnosis present

## 2016-07-31 DIAGNOSIS — E1122 Type 2 diabetes mellitus with diabetic chronic kidney disease: Secondary | ICD-10-CM | POA: Diagnosis present

## 2016-07-31 DIAGNOSIS — I272 Pulmonary hypertension, unspecified: Secondary | ICD-10-CM | POA: Diagnosis present

## 2016-07-31 DIAGNOSIS — K219 Gastro-esophageal reflux disease without esophagitis: Secondary | ICD-10-CM | POA: Diagnosis not present

## 2016-07-31 DIAGNOSIS — Z6841 Body Mass Index (BMI) 40.0 and over, adult: Secondary | ICD-10-CM

## 2016-07-31 DIAGNOSIS — Z7951 Long term (current) use of inhaled steroids: Secondary | ICD-10-CM

## 2016-07-31 DIAGNOSIS — R05 Cough: Secondary | ICD-10-CM | POA: Diagnosis not present

## 2016-07-31 DIAGNOSIS — E785 Hyperlipidemia, unspecified: Secondary | ICD-10-CM | POA: Diagnosis present

## 2016-07-31 DIAGNOSIS — N183 Chronic kidney disease, stage 3 (moderate): Secondary | ICD-10-CM | POA: Diagnosis present

## 2016-07-31 DIAGNOSIS — E1169 Type 2 diabetes mellitus with other specified complication: Secondary | ICD-10-CM | POA: Diagnosis not present

## 2016-07-31 LAB — I-STAT VENOUS BLOOD GAS, ED
Acid-Base Excess: 24 mmol/L — ABNORMAL HIGH (ref 0.0–2.0)
BICARBONATE: 53.9 mmol/L — AB (ref 20.0–28.0)
O2 Saturation: 21 %
PCO2 VEN: 84.2 mmHg — AB (ref 44.0–60.0)
PH VEN: 7.415 (ref 7.250–7.430)
PO2 VEN: 17 mmHg — AB (ref 32.0–45.0)

## 2016-07-31 LAB — I-STAT ARTERIAL BLOOD GAS, ED
ACID-BASE EXCESS: 23 mmol/L — AB (ref 0.0–2.0)
BICARBONATE: 54 mmol/L — AB (ref 20.0–28.0)
O2 Saturation: 89 %
PCO2 ART: 93.7 mmHg — AB (ref 32.0–48.0)
PO2 ART: 63 mmHg — AB (ref 83.0–108.0)
Patient temperature: 98.6
TCO2: >50 mmol/L (ref 0–100)
pH, Arterial: 7.369 (ref 7.350–7.450)

## 2016-07-31 LAB — CBC
HCT: 40.5 % (ref 36.0–46.0)
HEMOGLOBIN: 10.6 g/dL — AB (ref 12.0–15.0)
MCH: 24.2 pg — AB (ref 26.0–34.0)
MCHC: 26.2 g/dL — AB (ref 30.0–36.0)
MCV: 92.5 fL (ref 78.0–100.0)
Platelets: 237 10*3/uL (ref 150–400)
RBC: 4.38 MIL/uL (ref 3.87–5.11)
RDW: 15.5 % (ref 11.5–15.5)
WBC: 5.8 10*3/uL (ref 4.0–10.5)

## 2016-07-31 LAB — BASIC METABOLIC PANEL
ANION GAP: 8 (ref 5–15)
BUN: 9 mg/dL (ref 6–20)
CALCIUM: 9.4 mg/dL (ref 8.9–10.3)
CHLORIDE: 85 mmol/L — AB (ref 101–111)
CO2: 49 mmol/L — AB (ref 22–32)
Creatinine, Ser: 0.62 mg/dL (ref 0.44–1.00)
GFR calc non Af Amer: >60 mL/min (ref >60)
GLUCOSE: 108 mg/dL — AB (ref 65–99)
Potassium: 4.3 mmol/L (ref 3.5–5.1)
Sodium: 142 mmol/L (ref 135–145)

## 2016-07-31 LAB — I-STAT TROPONIN, ED: Troponin i, poc: 0.01 ng/mL (ref 0.00–0.08)

## 2016-07-31 LAB — CBG MONITORING, ED: Glucose-Capillary: 136 mg/dL — ABNORMAL HIGH (ref 65–99)

## 2016-07-31 LAB — BRAIN NATRIURETIC PEPTIDE: B Natriuretic Peptide: 448.9 pg/mL — ABNORMAL HIGH (ref 0.0–100.0)

## 2016-07-31 LAB — TROPONIN I

## 2016-07-31 MED ORDER — AZITHROMYCIN 250 MG PO TABS
500.0000 mg | ORAL_TABLET | Freq: Every day | ORAL | Status: AC
  Administered 2016-07-31: 500 mg via ORAL
  Filled 2016-07-31: qty 2

## 2016-07-31 MED ORDER — ONDANSETRON HCL 4 MG/2ML IJ SOLN
4.0000 mg | Freq: Three times a day (TID) | INTRAMUSCULAR | Status: DC | PRN
Start: 2016-07-31 — End: 2016-08-11

## 2016-07-31 MED ORDER — PRAVASTATIN SODIUM 40 MG PO TABS
40.0000 mg | ORAL_TABLET | Freq: Every day | ORAL | Status: DC
  Administered 2016-07-31 – 2016-08-11 (×12): 40 mg via ORAL
  Filled 2016-07-31 (×12): qty 1

## 2016-07-31 MED ORDER — LORATADINE-PSEUDOEPHEDRINE ER 10-240 MG PO TB24: 1.0000 | ORAL_TABLET | Freq: Every day | ORAL | Status: DC | PRN

## 2016-07-31 MED ORDER — DIAZEPAM 5 MG PO TABS: 5.0000 mg | ORAL_TABLET | Freq: Three times a day (TID) | ORAL | Status: DC | PRN

## 2016-07-31 MED ORDER — INSULIN ASPART 100 UNIT/ML ~~LOC~~ SOLN
0.0000 [IU] | Freq: Three times a day (TID) | SUBCUTANEOUS | Status: DC
  Administered 2016-08-01: 1 [IU] via SUBCUTANEOUS
  Administered 2016-08-02 (×3): 2 [IU] via SUBCUTANEOUS
  Administered 2016-08-03 – 2016-08-04 (×2): 3 [IU] via SUBCUTANEOUS
  Administered 2016-08-04: 2 [IU] via SUBCUTANEOUS
  Administered 2016-08-04: 5 [IU] via SUBCUTANEOUS
  Administered 2016-08-05: 2 [IU] via SUBCUTANEOUS
  Administered 2016-08-05: 3 [IU] via SUBCUTANEOUS

## 2016-07-31 MED ORDER — LOSARTAN POTASSIUM 50 MG PO TABS
50.0000 mg | ORAL_TABLET | Freq: Every day | ORAL | Status: DC
  Administered 2016-07-31 – 2016-08-01 (×2): 50 mg via ORAL
  Filled 2016-07-31 (×2): qty 1

## 2016-07-31 MED ORDER — VARENICLINE TARTRATE 0.5 MG PO TABS: 0.5000 mg | ORAL_TABLET | Freq: Every day | ORAL | Status: DC

## 2016-07-31 MED ORDER — DULOXETINE HCL 60 MG PO CPEP
60.0000 mg | ORAL_CAPSULE | Freq: Every day | ORAL | Status: DC
  Administered 2016-07-31 – 2016-08-11 (×12): 60 mg via ORAL
  Filled 2016-07-31 (×12): qty 1

## 2016-07-31 MED ORDER — AZITHROMYCIN 500 MG PO TABS
250.0000 mg | ORAL_TABLET | Freq: Every day | ORAL | Status: AC
  Administered 2016-08-01 – 2016-08-04 (×4): 250 mg via ORAL
  Filled 2016-07-31 (×4): qty 1

## 2016-07-31 MED ORDER — ASPIRIN EC 81 MG PO TBEC
81.0000 mg | DELAYED_RELEASE_TABLET | Freq: Every day | ORAL | Status: DC
  Administered 2016-07-31 – 2016-08-02 (×3): 81 mg via ORAL
  Filled 2016-07-31 (×3): qty 1

## 2016-07-31 MED ORDER — VITAMIN D 1000 UNITS PO TABS
1000.0000 [IU] | ORAL_TABLET | Freq: Every day | ORAL | Status: DC
  Administered 2016-07-31 – 2016-08-11 (×12): 1000 [IU] via ORAL
  Filled 2016-07-31 (×12): qty 1

## 2016-07-31 MED ORDER — INSULIN ASPART 100 UNIT/ML ~~LOC~~ SOLN
0.0000 [IU] | Freq: Every day | SUBCUTANEOUS | Status: DC
  Administered 2016-08-03: 5 [IU] via SUBCUTANEOUS
  Administered 2016-08-04 – 2016-08-06 (×2): 2 [IU] via SUBCUTANEOUS
  Administered 2016-08-07: 3 [IU] via SUBCUTANEOUS
  Administered 2016-08-09: 2 [IU] via SUBCUTANEOUS
  Administered 2016-08-10: 4 [IU] via SUBCUTANEOUS

## 2016-07-31 MED ORDER — GUAIFENESIN ER 600 MG PO TB12
600.0000 mg | ORAL_TABLET | Freq: Every day | ORAL | Status: DC
  Administered 2016-07-31 – 2016-08-11 (×12): 600 mg via ORAL
  Filled 2016-07-31 (×12): qty 1

## 2016-07-31 MED ORDER — LEVALBUTEROL HCL 1.25 MG/0.5ML IN NEBU
1.2500 mg | INHALATION_SOLUTION | Freq: Four times a day (QID) | RESPIRATORY_TRACT | Status: DC
  Administered 2016-07-31: 1.25 mg via RESPIRATORY_TRACT
  Filled 2016-07-31 (×2): qty 0.5

## 2016-07-31 MED ORDER — DILTIAZEM HCL 60 MG PO TABS
60.0000 mg | ORAL_TABLET | Freq: Two times a day (BID) | ORAL | Status: DC
  Filled 2016-07-31: qty 1

## 2016-07-31 MED ORDER — DILTIAZEM HCL 100 MG IV SOLR
5.0000 mg/h | INTRAVENOUS | Status: DC
  Administered 2016-08-01 – 2016-08-02 (×2): 15 mg/h via INTRAVENOUS
  Filled 2016-07-31 (×7): qty 100

## 2016-07-31 MED ORDER — DEXTROSE 5 % IV SOLN
100.0000 mg | Freq: Once | INTRAVENOUS | Status: DC
  Filled 2016-07-31: qty 100

## 2016-07-31 MED ORDER — ACETAMINOPHEN 325 MG PO TABS
650.0000 mg | ORAL_TABLET | Freq: Four times a day (QID) | ORAL | Status: DC | PRN
  Administered 2016-08-02 – 2016-08-10 (×8): 650 mg via ORAL
  Filled 2016-07-31 (×8): qty 2

## 2016-07-31 MED ORDER — FAMOTIDINE 20 MG PO TABS
20.0000 mg | ORAL_TABLET | Freq: Two times a day (BID) | ORAL | Status: DC
  Administered 2016-07-31 – 2016-08-11 (×22): 20 mg via ORAL
  Filled 2016-07-31 (×22): qty 1

## 2016-07-31 MED ORDER — ENOXAPARIN SODIUM 40 MG/0.4ML ~~LOC~~ SOLN
40.0000 mg | SUBCUTANEOUS | Status: DC
  Administered 2016-07-31 – 2016-08-01 (×2): 40 mg via SUBCUTANEOUS
  Filled 2016-07-31 (×2): qty 0.4

## 2016-07-31 MED ORDER — IPRATROPIUM BROMIDE 0.02 % IN SOLN
0.5000 mg | RESPIRATORY_TRACT | Status: DC
  Administered 2016-07-31 – 2016-08-01 (×4): 0.5 mg via RESPIRATORY_TRACT
  Filled 2016-07-31 (×5): qty 2.5

## 2016-07-31 MED ORDER — METHYLPREDNISOLONE SODIUM SUCC 125 MG IJ SOLR
125.0000 mg | Freq: Once | INTRAMUSCULAR | Status: AC
  Administered 2016-07-31: 125 mg via INTRAVENOUS
  Filled 2016-07-31: qty 2

## 2016-07-31 MED ORDER — FUROSEMIDE 10 MG/ML IJ SOLN
40.0000 mg | Freq: Every day | INTRAMUSCULAR | Status: DC
  Administered 2016-07-31 – 2016-08-03 (×4): 40 mg via INTRAVENOUS
  Filled 2016-07-31 (×4): qty 4

## 2016-07-31 MED ORDER — ACETAMINOPHEN-CODEINE #3 300-30 MG PO TABS
1.0000 | ORAL_TABLET | Freq: Three times a day (TID) | ORAL | Status: DC | PRN
  Administered 2016-08-03: 1 via ORAL
  Filled 2016-07-31: qty 1

## 2016-07-31 MED ORDER — ZOLPIDEM TARTRATE 5 MG PO TABS: 5.0000 mg | ORAL_TABLET | Freq: Every evening | ORAL | Status: DC | PRN

## 2016-07-31 MED ORDER — IPRATROPIUM-ALBUTEROL 0.5-2.5 (3) MG/3ML IN SOLN
3.0000 mL | RESPIRATORY_TRACT | Status: DC | PRN
  Administered 2016-07-31: 3 mL via RESPIRATORY_TRACT
  Filled 2016-07-31: qty 3

## 2016-07-31 MED ORDER — LEVALBUTEROL HCL 1.25 MG/0.5ML IN NEBU
1.2500 mg | INHALATION_SOLUTION | Freq: Four times a day (QID) | RESPIRATORY_TRACT | Status: DC
  Administered 2016-08-01 – 2016-08-11 (×43): 1.25 mg via RESPIRATORY_TRACT
  Filled 2016-07-31 (×44): qty 0.5

## 2016-07-31 MED ORDER — METHYLPREDNISOLONE SODIUM SUCC 125 MG IJ SOLR
60.0000 mg | Freq: Three times a day (TID) | INTRAMUSCULAR | Status: DC
  Administered 2016-08-01 – 2016-08-03 (×7): 60 mg via INTRAVENOUS
  Filled 2016-07-31 (×7): qty 2

## 2016-07-31 MED ORDER — LEVOTHYROXINE SODIUM 75 MCG PO TABS
175.0000 ug | ORAL_TABLET | Freq: Every day | ORAL | Status: DC
  Administered 2016-08-01 – 2016-08-11 (×11): 175 ug via ORAL
  Filled 2016-07-31 (×12): qty 1

## 2016-07-31 NOTE — ED Notes (Signed)
EKG completed and given to EDP and admit Doctor.

## 2016-07-31 NOTE — ED Provider Notes (Signed)
[facility] DEPT Provider Note   CSN: [id number] Arrival date & time: 07/31/16  1709     History   Chief Complaint Chief Complaint  Patient presents with  . Shortness of Breath  . Cough    HPI Jillian Wood is a 70 y.o. female with a history of COPD, on 4 L of oxygen by nasal cannula, presents to the emergency department noting increased shortness of breath, rhinorrhea, and dry nonproductive cough over the last 4 days. She has had associated chills but no measured fevers. As a result of her increased shortness of breath she has had to increase her home O2 to 8 L She notes recent sick contacts with her son who had rhinovirus and acute hypoxic respiratory failure approximately 2 weeks prior. She states that she has been using her home medications and albuterol to no avail of her symptoms.. She states that her symptoms feel similar to her previous COPD exacerbations. She denies any chest pain, pleurisy, chest tightness, leg swelling denies any history of prior DVT/PE  HPI  Past Medical History:  Diagnosis Date  . Arthritis   . COPD (chronic obstructive pulmonary disease) ([place])   . Depression   . Diabetes mellitus type 2 in obese ([place])   . GERD (gastroesophageal reflux disease)   . Hyperlipidemia   . Hypertension   . Morbid obesity ([place])   . OAB (overactive bladder)   . PSVT (paroxysmal supraventricular tachycardia) ([place])   . Thyroid disease   . Vitamin D deficiency     Patient Active Problem List   Diagnosis Date Noted  . Acute on chronic respiratory failure with hypoxia ([place]) 07/31/2016  . COPD exacerbation ([place]) 07/31/2016  . COPD with acute exacerbation ([place]) 07/31/2016  . Atrial fibrillation with RVR ([place]) 07/31/2016  . GERD (gastroesophageal reflux disease) 07/31/2015  . Tobacco abuse 10/18/2014  . Acute on chronic diastolic CHF (congestive heart failure) ([place]) 10/18/2014  . COPD (chronic obstructive pulmonary disease) ([place]) 09/21/2014  . Paroxysmal SVT  (supraventricular tachycardia) ([place]) 08/04/2014  . Hypothyroidism 02/22/2014  . Medication management 11/06/2013  . Essential hypertension   . Hyperlipidemia   . Morbid obesity ([place])   . Vitamin D deficiency   . 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 IRON DEFICIENCY 07/11/2010  . Personal history of colonic polyps 07/11/2010    Past Surgical History:  Procedure Laterality Date  . CARPAL TUNNEL RELEASE     L 1992 R 1984  . EYE SURGERY Bilateral    cataract  . NM MYOVIEW LTD  01/2011   Dobutamine Myoview: Negative perfusion scan for ischemia / infarct (poor image capture); Pt developed SVT with Dobutamine that reproduced her CP as it resolved with restoration of NSR.  Marland Kitchen PUBOVAGINAL SLING    . TONSILLECTOMY AND ADENOIDECTOMY    . TRANSTHORACIC ECHOCARDIOGRAM  01/2011   Hyperdynamic LV with EF 65-70%, Gr 1 DD, Mild Ao Stenosis - mean gradient ~19 mmHg    OB History    No data available       Home Medications    Prior to Admission medications   Medication Sig Start Date End Date Taking? Authorizing Provider  acetaminophen-codeine (TYLENOL #3) 300-30 MG per tablet Take 1 tablet by mouth every 8 (eight) hours as needed for moderate pain or severe pain. 06/07/15  Yes [name], PA-C  acetaZOLAMIDE (DIAMOX) 250 MG tablet Take 1 tablet (250 mg total) by mouth 3 (three) times daily. Patient taking differently: Take 250 mg by mouth  2 (two) times daily.  04/09/16  Yes Unk Pinto, MD  albuterol (PROVENTIL HFA;VENTOLIN HFA) 108 (90 Base) MCG/ACT inhaler Inhale 1-2 puffs into the lungs every 6 (six) hours as needed for wheezing or shortness of breath. 06/06/16  Yes Courtney Forcucci, PA-C  aspirin 81 MG tablet Take 81 mg by mouth daily.   Yes Historical Provider, MD  budesonide-formoterol (SYMBICORT) 160-4.5 MCG/ACT inhaler 2 puffs twice a day, wash mouth afterwards Patient taking differently: Inhale 2 puffs into the lungs 2 (two) times daily as needed.  10/26/14  Yes [name], PA-C  Cholecalciferol (VITAMIN D PO) Take 2,000 Units by mouth daily.    Yes Historical Provider, MD  diltiazem (CARDIZEM) 60 MG tablet TAKE 1 TABLET (60 MG TOTAL) BY MOUTH 2 (TWO) TIMES DAILY. 01/07/16  Yes Courtney Forcucci, PA-C  DULoxetine (CYMBALTA) 60 MG capsule TAKE 1 CAPSULE BY MOUTH DAILY 05/03/16  Yes Unk Pinto, MD  famotidine (PEPCID) 20 MG tablet TAKE 1 TABLET (20 MG TOTAL) BY MOUTH 2 (TWO) TIMES DAILY. FOR ACID REFLUX 04/27/16  Yes Unk Pinto, MD  guaiFENesin (MUCINEX) 600 MG 12 hr tablet Take 600 mg by mouth daily.   Yes Historical Provider, MD  levothyroxine (SYNTHROID, LEVOTHROID) 175 MCG tablet Take 1 tablet (175 mcg total) by mouth daily before breakfast. 06/07/16  Yes Courtney Forcucci, PA-C  loratadine-pseudoephedrine (CLARITIN-D 24-HOUR) 10-240 MG per 24 hr tablet Take 1 tablet by mouth as needed for allergies.    Yes Historical Provider, MD  losartan (COZAAR) 50 MG tablet TAKE 1 TABLET (50 MG TOTAL) BY MOUTH DAILY. 12/03/15  Yes [name], PA-C  Magnesium Chloride (MAGNESIUM DR PO) Take 1 capsule by mouth daily.    Yes Historical Provider, MD  metFORMIN (GLUCOPHAGE-XR) 500 MG 24 hr tablet TAKE 1 TABLET (500 MG TOTAL) BY MOUTH 4 (FOUR) TIMES DAILY - AFTER MEALS AND AT BEDTIME. 03/11/16  Yes Unk Pinto, MD  OXYGEN-HELIUM IN Inhale 3 L into the lungs.    Yes Historical Provider, MD  pravastatin (PRAVACHOL) 40 MG tablet TAKE 1 TABLET (40 MG TOTAL) BY MOUTH DAILY. 03/07/16  Yes Unk Pinto, MD  CHANTIX CONTINUING MONTH PAK 1 MG tablet TAKE 1/2 TO 1 TABLET 2 X A DAY AS DIRECTED FOR SMOKING CESSATION 06/17/16   [name], PA-C    Family History Family History  Problem Relation Age of Onset  . Alcohol abuse Father   . Heart disease Father     Social History Social History  Substance Use Topics  . Smoking status: Current Every Day Smoker    Packs/day: 0.50    Years: 44.00    Types: Cigarettes  . Smokeless tobacco: Never Used     Comment:  smoke about 5-6 cigarette daily  . Alcohol use No     Allergies   Inderal [propranolol] and Ace inhibitors   Review of Systems Review of Systems  Constitutional: Positive for activity change, chills and fatigue. Negative for appetite change, diaphoresis and fever.  HENT: Positive for congestion, rhinorrhea and sneezing. Negative for sinus pain, sinus pressure and sore throat.   Respiratory: Positive for cough, shortness of breath and wheezing.   Cardiovascular: Negative for chest pain, palpitations and leg swelling.  Gastrointestinal: Negative for abdominal pain, nausea and vomiting.  Musculoskeletal: Negative for myalgias.  Skin: Negative for rash.  Neurological: Negative for syncope, weakness, light-headedness, numbness and headaches.  All other systems reviewed and are negative.    Physical Exam Updated Vital Signs BP 93/72   Pulse Marland Kitchen)  136   Temp 97.7 F (36.5 C) (Axillary)   Resp (!) 21   SpO2 95%   Physical Exam  Constitutional: She is oriented to person, place, and time. She appears well-developed and well-nourished.  HENT:  Head: Normocephalic and atraumatic.  Nose: Nose normal.  Oropharynx mildly erythematous, MM moist  Eyes: Conjunctivae and EOM are normal. Pupils are equal, round, and reactive to light.  Neck: Normal range of motion. Neck supple.  Cardiovascular: Normal rate, regular rhythm, normal heart sounds and intact distal pulses.   Pulmonary/Chest: Accessory muscle usage present. Tachypnea noted. No respiratory distress. She has wheezes. She has rales in the right lower field and the left lower field. She exhibits no tenderness.  Abdominal: Soft. She exhibits no distension. There is no tenderness.  Musculoskeletal: She exhibits no edema or tenderness.  Neurological: She is alert and oriented to person, place, and time. No cranial nerve deficit or sensory deficit. Coordination normal. GCS eye subscore is 4. GCS verbal subscore is 5. GCS motor subscore is 6.   Skin: Skin is warm and dry. She is not diaphoretic.  Nursing note and vitals reviewed.    ED Treatments / Results  Labs (all labs ordered are listed, but only abnormal results are displayed) Labs Reviewed  BASIC METABOLIC PANEL - Abnormal; Notable for the following:       Result Value   Chloride 85 (*)    CO2 49 (*)    Glucose, Bld 108 (*)    All other components within normal limits  CBC - Abnormal; Notable for the following:    Hemoglobin 10.6 (*)    MCH 24.2 (*)    MCHC 26.2 (*)    All other components within normal limits  BRAIN NATRIURETIC PEPTIDE - Abnormal; Notable for the following:    B Natriuretic Peptide 448.9 (*)    All other components within normal limits  TROPONIN I - Abnormal; Notable for the following:    Troponin I 0.04 (*)    All other components within normal limits  TROPONIN I - Abnormal; Notable for the following:    Troponin I 0.05 (*)    All other components within normal limits  GLUCOSE, CAPILLARY - Abnormal; Notable for the following:    Glucose-Capillary 140 (*)    All other components within normal limits  I-STAT VENOUS BLOOD GAS, ED - Abnormal; Notable for the following:    pCO2, Ven 84.2 (*)    pO2, Ven 17.0 (*)    Bicarbonate 53.9 (*)    Acid-Base Excess 24.0 (*)    All other components within normal limits  I-STAT ARTERIAL BLOOD GAS, ED - Abnormal; Notable for the following:    pCO2 arterial 93.7 (*)    pO2, Arterial 63.0 (*)    Bicarbonate 54.0 (*)    Acid-Base Excess 23.0 (*)    All other components within normal limits  CBG MONITORING, ED - Abnormal; Notable for the following:    Glucose-Capillary 136 (*)    All other components within normal limits  CULTURE, BLOOD (ROUTINE X 2)  CULTURE, BLOOD (ROUTINE X 2)  RESPIRATORY PANEL BY PCR  CULTURE, BLOOD (ROUTINE X 2)  CULTURE, BLOOD (ROUTINE X 2)  CULTURE, EXPECTORATED SPUTUM-ASSESSMENT  GRAM STAIN  MRSA PCR SCREENING  INFLUENZA PANEL BY PCR (TYPE A & B, H1N1)  TSH  TROPONIN I    STREP PNEUMONIAE URINARY ANTIGEN  HIV ANTIBODY (ROUTINE TESTING)  STREP PNEUMONIAE URINARY ANTIGEN  I-STAT TROPOININ, ED    EKG  EKG Interpretation  Date/Time:  Wednesday July 31 2016 17:15:14 EST Ventricular Rate:  95 PR Interval:  176 QRS Duration: 132 QT Interval:  370 QTC Calculation: 464 R Axis:   150 Text Interpretation:  Normal sinus rhythm Right bundle branch block Left posterior fascicular block Abnormal ECG No acute changes Confirmed by Kathrynn Humble, MD, Thelma Comp [phone]) on 07/31/2016 7:05:30 PM       Radiology Dg Chest 2 View  Result Date: 07/31/2016 CLINICAL DATA:  Cough and dyspnea for 4 days. EXAM: CHEST  2 VIEW COMPARISON:  09/27/2014 chest radiograph FINDINGS: The heart is top-normal in size. There is aortic atherosclerosis. Mild diffuse interstitial prominence consistent with interstitial edema is noted. There are small bilateral pleural effusions posteriorly blunting the costophrenic angles right greater than left. There is bibasilar atelectasis. No suspicious osseous lesions. IMPRESSION: Interstitial pulmonary edema with small pleural effusions and borderline cardiomegaly. Aortic atherosclerosis. Electronically Signed   By: Ashley Royalty M.D.   On: 07/31/2016 17:50    Procedures Procedures (including critical care time)  Medications Ordered in ED Medications  methylPREDNISolone sodium succinate (SOLU-MEDROL) 125 mg/2 mL injection 60 mg (60 mg Intravenous Given 08/01/16 1019)  levothyroxine (SYNTHROID, LEVOTHROID) tablet 175 mcg (175 mcg Oral Given 08/01/16 0841)  DULoxetine (CYMBALTA) DR capsule 60 mg (60 mg Oral Given 08/01/16 1021)  famotidine (PEPCID) tablet 20 mg (20 mg Oral Given 08/01/16 1020)  pravastatin (PRAVACHOL) tablet 40 mg (40 mg Oral Given 08/01/16 1019)  losartan (COZAAR) tablet 50 mg (50 mg Oral Given 08/01/16 1020)  acetaminophen-codeine (TYLENOL #3) 300-30 MG per tablet 1 tablet (not administered)  diazepam (VALIUM) tablet 5 mg (not  administered)  cholecalciferol (VITAMIN D) tablet 1,000 Units (1,000 Units Oral Given 08/01/16 1021)  guaiFENesin (MUCINEX) 12 hr tablet 600 mg (600 mg Oral Given 08/01/16 1021)  aspirin EC tablet 81 mg (81 mg Oral Given 08/01/16 1020)  furosemide (LASIX) injection 40 mg (40 mg Intravenous Given 08/01/16 1021)  ipratropium (ATROVENT) nebulizer solution 0.5 mg (0.5 mg Nebulization Given 08/01/16 0737)  azithromycin (ZITHROMAX) tablet 500 mg (500 mg Oral Given 07/31/16 2220)    Followed by  azithromycin (ZITHROMAX) tablet 250 mg (250 mg Oral Given 08/01/16 1020)  insulin aspart (novoLOG) injection 0-9 Units (1 Units Subcutaneous Given 08/01/16 0842)  insulin aspart (novoLOG) injection 0-5 Units (0 Units Subcutaneous Not Given 07/31/16 2247)  enoxaparin (LOVENOX) injection 40 mg (40 mg Subcutaneous Given 07/31/16 2227)  acetaminophen (TYLENOL) tablet 650 mg (not administered)  zolpidem (AMBIEN) tablet 5 mg (not administered)  ondansetron (ZOFRAN) injection 4 mg (not administered)  diltiazem (CARDIZEM) 100 mg in dextrose 5 % 100 mL (1 mg/mL) infusion (0 mg/hr Intravenous Hold 07/31/16 2241)  levalbuterol (XOPENEX) nebulizer solution 1.25 mg (1.25 mg Nebulization Given 08/01/16 0737)  methylPREDNISolone sodium succinate (SOLU-MEDROL) 125 mg/2 mL injection 125 mg (125 mg Intravenous Given 07/31/16 1823)     Initial Impression / Assessment and Plan / ED Course  I have reviewed the triage vital signs and the nursing notes.  Pertinent labs & imaging results that were available during my care of the patient were reviewed by me and considered in my medical decision making (see chart for details).  Clinical Course    70 y.o. female presents with COPD exacerbation with known sick contact recently. On arrival she is tachypnic but able to speak in full, short sentences. GCS 15. She was given duonebs and steroids on arrival and was placed initially on 8L by [place] and then face mask, but continued to  sat in  the high 80's intermittently and would desat quickly when oxygen is removed. She was placed on bipap and improved her WOB and tachypnea.   CXR showed interstitial edema, but no PNA.   Labs were significant for ABG which showed normal pH, but CO2 93.7, PO2 63.    She was then admitted to the hospitalist SDU for further care and assessment.   Final Clinical Impressions(s) / ED Diagnoses   Final diagnoses:  COPD with acute exacerbation [facility])    New Prescriptions Current Discharge Medication List       Zenovia Jarred, DO 08/01/16 [place], MD 08/02/16 2112

## 2016-07-31 NOTE — ED Notes (Signed)
Patient repositioned replaced BP cuff pulse oximetry probe. Patient states uncomfortably on stretcher. Heart rate 100-138 and pulse oximetry 80's while pulse ox on forehead. Placed on left ear lobe pulse ox 84% placed on nonrebreather for 3 minutes increased to 86% EDP and Respiratory notified.  Placed patient back on 6L [place]. Respiratory at bedside.

## 2016-07-31 NOTE — ED Triage Notes (Signed)
C/o increased sob with dry cough x 4 days.  Normally wears 4liters O2 via [place] at home.  States she has had to increased O2 to 8 liters today.  Family reports pt is "shaky" and has had episodes of confusion.

## 2016-07-31 NOTE — ED Notes (Signed)
Spoke with Doctor regard blood cultures will order blood cultures.

## 2016-07-31 NOTE — ED Notes (Signed)
Dr. Ronnald Ramp at bedside, made aware of patients O2 sat of 78% on 8L of O2.

## 2016-07-31 NOTE — ED Notes (Signed)
Secretary ordering hospital bed for patient. Patient repositioned multiple times prior shift changed and once this shift change.

## 2016-07-31 NOTE — ED Notes (Signed)
Notified Santiago Glad [employer] banker[employer]) of pt. b/p and low Sp02.

## 2016-07-31 NOTE — ED Notes (Signed)
Spoke with lab to send flu swabs to the ED.

## 2016-07-31 NOTE — ED Notes (Signed)
EKG completed given to EDP.  

## 2016-07-31 NOTE — ED Notes (Signed)
Family at bedside. 

## 2016-07-31 NOTE — H&P (Signed)
History and Physical    Jillian Wood RJJ:[id number] DOB: [birth year] DOA: 07/31/2016  Referring MD/NP/PA:   PCP: Alesia Richards, MD   Patient coming from:  The patient is coming from home.  At baseline, pt is partially dependent for most of ADL.  Chief Complaint: Shortness of breath  HPI: Jillian Wood is a 70 y.o. female with medical history significant of hypertension, hyperlipidemia, diabetes mellitus, COPD, on 4 L oxygen, GERD, hypothyroidism, depression, anxiety, PSVT, tobacco abuse, dCHF, who presents with shortness of breath.  Patient states that she has been having worsening shortness of breath for 4 days. She has dry cough, no chest pain, fever or chills. She has mild runny nose, no sore throat. She has to increase oxygen use from 4 L to 8 L. She speaks in full sentence. Patient denies nausea, vomiting, abdominal pain, diarrhea, symptoms of UTI or unilateral weakness. She has mild leg edema bilaterally. No tenderness over calf areas.  ED Course: pt was found to have BNP 448.9, WBC 5.8, troponin negative, bicarbonate 49, chloride 85, tachycardia with heart rates up to 151, tachypnea, oxygen desaturated to 78% on nasal cannula oxygen, which improved to 93% on BiPAP. ABG with pH 7.369, PCO2 93.7, and PO2 63. Chest x-ray showed interstitial pulmonary edema without infiltration. Initial EKG showed sinus tachycardia, but the repeated EKG showed A flutter/A fib with RVR. Pt is admitted to stepdown bed as inpatient.  Review of Systems:   General: no fevers, chills, has poor appetite, has fatigue HEENT: no blurry vision, hearing changes or sore throat Respiratory: has dyspnea, coughing, no wheezing CV: no chest pain, no palpitations GI: no nausea, vomiting, abdominal pain, diarrhea, constipation GU: no dysuria, burning on urination, increased urinary frequency, hematuria  Ext: has mild leg edema Neuro: no unilateral weakness, numbness, or tingling, no vision change or hearing  loss Skin: no rash, no skin tear. MSK: No muscle spasm, no deformity, no limitation of range of movement in spin Heme: No easy bruising.  Travel history: No recent long distant travel.  Allergy:  Allergies  Allergen Reactions  . Inderal [Propranolol] Other (See Comments)    Hair loss  . Ace Inhibitors Cough    Past Medical History:  Diagnosis Date  . Arthritis   . COPD (chronic obstructive pulmonary disease) ([place])   . Depression   . Diabetes mellitus type 2 in obese ([place])   . GERD (gastroesophageal reflux disease)   . Hyperlipidemia   . Hypertension   . Morbid obesity ([place])   . OAB (overactive bladder)   . PSVT (paroxysmal supraventricular tachycardia) ([place])   . Thyroid disease   . Vitamin D deficiency     Past Surgical History:  Procedure Laterality Date  . CARPAL TUNNEL RELEASE     L 1992 R 1984  . EYE SURGERY Bilateral    cataract  . NM MYOVIEW LTD  01/2011   Dobutamine Myoview: Negative perfusion scan for ischemia / infarct (poor image capture); Pt developed SVT with Dobutamine that reproduced her CP as it resolved with restoration of NSR.  Marland Kitchen PUBOVAGINAL SLING    . TONSILLECTOMY AND ADENOIDECTOMY    . TRANSTHORACIC ECHOCARDIOGRAM  01/2011   Hyperdynamic LV with EF 65-70%, Gr 1 DD, Mild Ao Stenosis - mean gradient ~19 mmHg    Social History:  reports that she has been smoking Cigarettes.  She has a 22.00 pack-year smoking history. She has never used smokeless tobacco. She reports that she does not drink alcohol or use drugs.  Family History:  Family History  Problem Relation Age of Onset  . Alcohol abuse Father   . Heart disease Father      Prior to Admission medications   Medication Sig Start Date End Date Taking? Authorizing Provider  acetaminophen-codeine (TYLENOL #3) 300-30 MG per tablet Take 1 tablet by mouth every 8 (eight) hours as needed for moderate pain or severe pain. 06/07/15   [name], PA-C  acetaZOLAMIDE (DIAMOX) 250 MG tablet Take 1 tablet  (250 mg total) by mouth 3 (three) times daily. 04/09/16   Unk Pinto, MD  albuterol (PROVENTIL HFA;VENTOLIN HFA) 108 (90 Base) MCG/ACT inhaler Inhale 1-2 puffs into the lungs every 6 (six) hours as needed for wheezing or shortness of breath. 06/06/16   Courtney Forcucci, PA-C  aspirin 81 MG tablet Take 81 mg by mouth daily.    Historical Provider, MD  budesonide-formoterol [facility]) 160-4.5 MCG/ACT inhaler 2 puffs twice a day, wash mouth afterwards 10/26/14   [name], PA-C  CHANTIX CONTINUING MONTH PAK 1 MG tablet TAKE 1/2 TO 1 TABLET 2 X A DAY AS DIRECTED FOR SMOKING CESSATION 06/17/16   [name], PA-C  Cholecalciferol (VITAMIN D PO) Take 10,000 Units by mouth daily.    Historical Provider, MD  diazepam (VALIUM) 5 MG tablet Take 1 tab 3 times a day for anxiety. 08/30/14   Unk Pinto, MD  diltiazem (CARDIZEM) 60 MG tablet TAKE 1 TABLET (60 MG TOTAL) BY MOUTH 2 (TWO) TIMES DAILY. 01/07/16   Courtney Forcucci, PA-C  DULoxetine (CYMBALTA) 60 MG capsule TAKE 1 CAPSULE BY MOUTH DAILY 05/03/16   Unk Pinto, MD  famotidine (PEPCID) 20 MG tablet TAKE 1 TABLET (20 MG TOTAL) BY MOUTH 2 (TWO) TIMES DAILY. FOR ACID REFLUX 04/27/16   Unk Pinto, MD  furosemide (LASIX) 80 MG tablet Take 1/2 tab daily for BP and fluid. 08/18/14   Unk Pinto, MD  glucose blood (FREESTYLE LITE) test strip Use as instructed 06/07/16   Starlyn Skeans, PA-C  glucose blood test strip Check blood sugar 1 time daily-DX-E11.22 06/06/16   Starlyn Skeans, PA-C  glucose monitoring kit (FREESTYLE) monitoring kit Check blood sugar 1 time daily-DX-E11.22 03/27/15   Unk Pinto, MD  guaiFENesin (MUCINEX) 600 MG 12 hr tablet Take 600 mg by mouth daily.    Historical Provider, MD  Lancets (FREESTYLE) lancets Check blood sugar 1 time daily-DX-11.22 03/27/15   Unk Pinto, MD  levothyroxine (SYNTHROID, LEVOTHROID) 175 MCG tablet Take 1 tablet (175 mcg total) by mouth daily before breakfast. 06/07/16   Courtney  Forcucci, PA-C  loratadine-pseudoephedrine (CLARITIN-D 24-HOUR) 10-240 MG per 24 hr tablet Take 1 tablet by mouth as needed.     Historical Provider, MD  losartan (COZAAR) 50 MG tablet TAKE 1 TABLET (50 MG TOTAL) BY MOUTH DAILY. 12/03/15   [name], PA-C  Magnesium Chloride (MAGNESIUM DR PO) Take 1 capsule by mouth daily.     Historical Provider, MD  metFORMIN (GLUCOPHAGE-XR) 500 MG 24 hr tablet TAKE 1 TABLET (500 MG TOTAL) BY MOUTH 4 (FOUR) TIMES DAILY - AFTER MEALS AND AT BEDTIME. 03/11/16   Unk Pinto, MD  OXYGEN-HELIUM IN Inhale 3 L into the lungs.     Historical Provider, MD  pravastatin (PRAVACHOL) 40 MG tablet TAKE 1 TABLET (40 MG TOTAL) BY MOUTH DAILY. 03/07/16   Unk Pinto, MD    Physical Exam: Vitals:   07/31/16 1957 07/31/16 2000 07/31/16 2015 07/31/16 2024  BP:  117/95 127/80   Pulse: (!) 150 (!) 138 (!) 140 Marland Kitchen)  140  Resp: 26 24 (!) 28 23  Temp:      TempSrc:      SpO2: 93% 97% 98% 96%   General: Not in acute distress HEENT:       Eyes: PERRL, EOMI, no scleral icterus.       ENT: No discharge from the ears and nose, no pharynx injection, no tonsillar enlargement.        Neck: Difficult to assess JVD due to obesity, no bruit, no mass felt. Heme: No neck lymph node enlargement. Cardiac: S1/S2, RRR, No murmurs, No gallops or rubs. Respiratory: has decreased air movement bilaterally. Has rhonchi bilaterally, No rales or rubs. GI: Soft, nondistended, nontender, no rebound pain, no organomegaly, BS present. GU: No hematuria Ext: has 1+ pitting leg edema bilaterally. 2+DP/PT pulse bilaterally. Musculoskeletal: No joint deformities, No joint redness or warmth, no limitation of ROM in spin. Skin: No rashes.  Neuro: Alert, oriented X3, cranial nerves II-XII grossly intact, moves all extremities normally.  Psych: Patient is not psychotic, no suicidal or hemocidal ideation.  Labs on Admission: I have personally reviewed following labs and imaging  studies  CBC:  Recent Labs Lab 07/31/16 1805  WBC 5.8  HGB 10.6*  HCT 40.5  MCV 92.5  PLT 932   Basic Metabolic Panel:  Recent Labs Lab 07/31/16 1805  NA 142  K 4.3  CL 85*  CO2 49*  GLUCOSE 108*  BUN 9  CREATININE 0.62  CALCIUM 9.4   GFR: Estimated Creatinine Clearance: 82.3 mL/min (by C-G formula based on SCr of 0.62 mg/dL). Liver Function Tests: No results for input(s): AST, ALT, ALKPHOS, BILITOT, PROT, ALBUMIN in the last 168 hours. No results for input(s): LIPASE, AMYLASE in the last 168 hours. No results for input(s): AMMONIA in the last 168 hours. Coagulation Profile: No results for input(s): INR, PROTIME in the last 168 hours. Cardiac Enzymes: No results for input(s): CKTOTAL, CKMB, CKMBINDEX, TROPONINI in the last 168 hours. BNP (last 3 results) No results for input(s): PROBNP in the last 8760 hours. HbA1C: No results for input(s): HGBA1C in the last 72 hours. CBG: No results for input(s): GLUCAP in the last 168 hours. Lipid Profile: No results for input(s): CHOL, HDL, LDLCALC, TRIG, CHOLHDL, LDLDIRECT in the last 72 hours. Thyroid Function Tests: No results for input(s): TSH, T4TOTAL, FREET4, T3FREE, THYROIDAB in the last 72 hours. Anemia Panel: No results for input(s): VITAMINB12, FOLATE, FERRITIN, TIBC, IRON, RETICCTPCT in the last 72 hours. Urine analysis:    Component Value Date/Time   COLORURINE YELLOW 07/27/2015 1612   APPEARANCEUR CLEAR 07/27/2015 1612   LABSPEC 1.017 07/27/2015 1612   PHURINE 6.0 07/27/2015 1612   GLUCOSEU NEGATIVE 07/27/2015 1612   HGBUR 2+ (A) 07/27/2015 1612   BILIRUBINUR NEGATIVE 07/27/2015 1612   KETONESUR NEGATIVE 07/27/2015 1612   PROTEINUR NEGATIVE 07/27/2015 1612   UROBILINOGEN 1.0 10/23/2009 1145   NITRITE NEGATIVE 07/27/2015 1612   LEUKOCYTESUR NEGATIVE 07/27/2015 1612   Sepsis Labs: '@LABRCNTIP'$ (procalcitonin:4,lacticidven:4) )No results found for this or any previous visit (from the past 240 hour(s)).    Radiological Exams on Admission: Dg Chest 2 View  Result Date: 07/31/2016 CLINICAL DATA:  Cough and dyspnea for 4 days. EXAM: CHEST  2 VIEW COMPARISON:  09/27/2014 chest radiograph FINDINGS: The heart is top-normal in size. There is aortic atherosclerosis. Mild diffuse interstitial prominence consistent with interstitial edema is noted. There are small bilateral pleural effusions posteriorly blunting the costophrenic angles right greater than left. There is bibasilar atelectasis. No suspicious  osseous lesions. IMPRESSION: Interstitial pulmonary edema with small pleural effusions and borderline cardiomegaly. Aortic atherosclerosis. Electronically Signed   By: Ashley Royalty M.D.   On: 07/31/2016 17:50     EKG: Independently reviewed.  Initial EKG showed sinus rhythm, tachycardia, QTC 464, old right bundle blockage. The repeated EKG showed A Flutter/afib with RVR.   Assessment/Plan Principal Problem:   Acute on chronic respiratory failure with hypoxia (HCC) Active Problems:   Essential hypertension   Hyperlipidemia   Hypothyroidism   Paroxysmal SVT (supraventricular tachycardia) (HCC)   Acute on chronic diastolic CHF (congestive heart failure) (HCC)   GERD (gastroesophageal reflux disease)   COPD with acute exacerbation (HCC)   Atrial fibrillation with RVR (HCC)   Acute on chronic respiratory failure with hypoxia: mostly likely due to combination of COPD given rhonchi on lung auscultation and CHF exacerbation given elevated BNP and leg edema plus CXR findings of interstitial edema.  -will admit to SDU as inpt -BiPAP started in ED. -will treat CHF and COPD exacerbation as below.  Acute on chronic diastolic CHF (congestive heart failure) ([place]): -Lasix 40 mg daily by IV -trop x 3 -2d echo -will continue home ASA and losartan -Daily weights -strict I/O's -Low salt diet  COPD exacerbation: -Nebulizers: scheduled Atrovent and prn xopenex neb -Solu-Medrol 60 mg IV tid -Oral  azithromycin for 5 days.  -Mucinex for cough  -Urine S. pneumococcal antigen -Follow up blood culture x2, sputum culture, respiratory virus panel, Flu pcr  HTN: -continue losartan -On IV Lasix  New onset Atrial fibrillation with RVR ([place]): likely triggered by CHF exacerbation and hypoxia. HA2DS2-VASc Score is 4, likely needs oral anticoagulation-->will need to get card consult and f/u card recommendation -IV cardizem gtt -f/u trop x and 2d echo -check TSH -please call card in AM  HLD: Last LDL was 68 on 06/06/16 -Continue home medications: Pravastatin  Hypothyroidism: Last TSH was 6.25 on 06/06/16 -Continue home Synthroid -Check TSH  GERD: -Pepcid  DVT ppx: SQ Lovenox Code Status: Full code Family Communication: Yes, patient's husband and son at bed side Disposition Plan:  Anticipate discharge back to previous home environment Consults called:  none Admission status:   SDU/inpation       Date of Service 07/31/2016    Ivor Costa [facility] Pager [phone]  If 7PM-7AM, please contact night-coverage [email or web address] Password [facility] 07/31/2016, 8:57 PM

## 2016-07-31 NOTE — Progress Notes (Signed)
Subjective:    Patient ID: Jillian Wood, female    DOB: [birth year], 70 y.o.   MRN: [id number]  HPI 70 y.o. morbidly obese WF with multiple co morbidities including severe COPD, CHF, PSVT, HTN, DM2 with CKD stage 3 presents with fatigue, SOB, dizziness, nervousness, worsening cough with mucus. States all started when she tried chantix 1 week ago, has stopped since not feeling well but still not feeling well. She denies fever, chills, CP. Son is here and states she has been on 8 L last 3 days and being on 90% oxygen, normally on 4 L at home. She has history of hypercapnia and is on 2 diamox a day, she has been very tired. No edema, + orthopnea, no PND. Weight is up 8 lbs.    She has not been on the lasix, she has not been on the symbicort since Monday, has not been using duoneb, has been doing albuterol.   BMI is Body mass index is 42.96 kg/m., she is working on diet and exercise. Wt Readings from Last 3 Encounters:  07/31/16 254 lb 3.2 oz (115.3 kg)  06/06/16 246 lb (111.6 kg)  04/17/16 254 lb 9.6 oz (115.5 kg)    Blood pressure 140/72, pulse 87, temperature 97.1 F (36.2 C), resp. rate 16, height 5' 4.5" (1.638 m), weight 254 lb 3.2 oz (115.3 kg), SpO2 94 %.  Medications Current Outpatient Prescriptions on File Prior to Visit  Medication Sig  . acetaminophen-codeine (TYLENOL #3) 300-30 MG per tablet Take 1 tablet by mouth every 8 (eight) hours as needed for moderate pain or severe pain.  Marland Kitchen acetaZOLAMIDE (DIAMOX) 250 MG tablet Take 1 tablet (250 mg total) by mouth 3 (three) times daily.  Marland Kitchen albuterol (PROVENTIL HFA;VENTOLIN HFA) 108 (90 Base) MCG/ACT inhaler Inhale 1-2 puffs into the lungs every 6 (six) hours as needed for wheezing or shortness of breath.  Marland Kitchen aspirin 81 MG tablet Take 81 mg by mouth daily.  . budesonide-formoterol (SYMBICORT) 160-4.5 MCG/ACT inhaler 2 puffs twice a day, wash mouth afterwards  . CHANTIX CONTINUING MONTH PAK 1 MG tablet TAKE 1/2 TO 1 TABLET 2 X A DAY AS  DIRECTED FOR SMOKING CESSATION  . Cholecalciferol (VITAMIN D PO) Take 10,000 Units by mouth daily.  . diazepam (VALIUM) 5 MG tablet Take 1 tab 3 times a day for anxiety.  Marland Kitchen diltiazem (CARDIZEM) 60 MG tablet TAKE 1 TABLET (60 MG TOTAL) BY MOUTH 2 (TWO) TIMES DAILY.  . DULoxetine (CYMBALTA) 60 MG capsule TAKE 1 CAPSULE BY MOUTH DAILY  . famotidine (PEPCID) 20 MG tablet TAKE 1 TABLET (20 MG TOTAL) BY MOUTH 2 (TWO) TIMES DAILY. FOR ACID REFLUX  . furosemide (LASIX) 80 MG tablet Take 1/2 tab daily for BP and fluid.  Marland Kitchen glucose blood (FREESTYLE LITE) test strip Use as instructed  . glucose blood test strip Check blood sugar 1 time daily-DX-E11.22  . glucose monitoring kit (FREESTYLE) monitoring kit Check blood sugar 1 time daily-DX-E11.22  . guaiFENesin (MUCINEX) 600 MG 12 hr tablet Take 600 mg by mouth daily.  . Lancets (FREESTYLE) lancets Check blood sugar 1 time daily-DX-11.22  . levothyroxine (SYNTHROID, LEVOTHROID) 175 MCG tablet Take 1 tablet (175 mcg total) by mouth daily before breakfast.  . loratadine-pseudoephedrine (CLARITIN-D 24-HOUR) 10-240 MG per 24 hr tablet Take 1 tablet by mouth as needed.   Marland Kitchen losartan (COZAAR) 50 MG tablet TAKE 1 TABLET (50 MG TOTAL) BY MOUTH DAILY.  . Magnesium Chloride (MAGNESIUM DR PO) Take 1 capsule by  mouth daily.   . metFORMIN (GLUCOPHAGE-XR) 500 MG 24 hr tablet TAKE 1 TABLET (500 MG TOTAL) BY MOUTH 4 (FOUR) TIMES DAILY - AFTER MEALS AND AT BEDTIME.  Marland Kitchen OXYGEN-HELIUM IN Inhale 3 L into the lungs.   . pravastatin (PRAVACHOL) 40 MG tablet TAKE 1 TABLET (40 MG TOTAL) BY MOUTH DAILY.   No current facility-administered medications on file prior to visit.     Problem list She has IRON DEFICIENCY; Personal history of colonic polyps; Essential hypertension; Hyperlipidemia; Morbid obesity ([place]); Vitamin D deficiency; T2_NIDDM w/Stage 3 CKD (GFR 72 ml/min); Medication management; Hypothyroidism; Paroxysmal SVT (supraventricular tachycardia) (HCC); COPD (chronic  obstructive pulmonary disease) ([place]); Tobacco abuse; Chronic diastolic HF (heart failure), with recent mild exacerbation; and GERD (gastroesophageal reflux disease) on her problem list.  Review of Systems  Constitutional: Negative for activity change, appetite change, chills and fever.  HENT: Positive for congestion, postnasal drip and rhinorrhea. Negative for ear pain, sore throat and tinnitus.   Eyes: Negative.   Respiratory: Positive for cough, shortness of breath and wheezing.   Cardiovascular: Negative for chest pain, palpitations and leg swelling.  Gastrointestinal: Negative for abdominal pain, blood in stool, constipation, diarrhea, nausea and vomiting.  Genitourinary: Positive for frequency (with incontinence, unchanged). Negative for dysuria and urgency.  Musculoskeletal: Positive for back pain and gait problem. Negative for arthralgias, joint swelling, myalgias, neck pain and neck stiffness.  Skin: Positive for wound (right ankle).  Neurological: Positive for tremors and weakness. Negative for dizziness and headaches.  Psychiatric/Behavioral: Positive for confusion. The patient is nervous/anxious.        Objective:   Physical Exam  Constitutional: No distress.  obese  Neck: Normal range of motion. Neck supple.  Cardiovascular: Normal rate.  Exam reveals decreased pulses.   Murmur heard.  Systolic murmur is present  Pulses:      Dorsalis pedis pulses are 1+ on the right side, and 1+ on the left side.       Posterior tibial pulses are 0 on the right side, and 0 on the left side.  Pulmonary/Chest: Effort normal. No accessory muscle usage. She has decreased breath sounds (bilateral bases, left worse than right). She has wheezes. She has no rhonchi. She has no rales.  [place] 8L O2 at 90%  Abdominal: Soft. There is no tenderness.  Musculoskeletal: Normal range of motion. She exhibits no edema (no edema).  In wheelchair  Lymphadenopathy:    She has no cervical adenopathy.   Neurological: She is alert. No cranial nerve deficit.  Slight tremor, some confusion  Skin: Skin is warm and dry. She is not diaphoretic.      Assessment & Plan:  Morbidly obese patient with oxygen dependent COPD and CHF Weight is up 8 lbs, decrease breath sounds bilateral bases Confusion per family and in the office.  90% on 8L, not doing symbicort or duoneb.  son will take to ER via private vehicle.

## 2016-07-31 NOTE — ED Notes (Signed)
Patient resting more comfortably on stretcher and feels better.

## 2016-07-31 NOTE — ED Notes (Signed)
ED Provider at bedside. 

## 2016-08-01 DIAGNOSIS — J441 Chronic obstructive pulmonary disease with (acute) exacerbation: Secondary | ICD-10-CM | POA: Diagnosis not present

## 2016-08-01 DIAGNOSIS — I4891 Unspecified atrial fibrillation: Secondary | ICD-10-CM | POA: Diagnosis not present

## 2016-08-01 DIAGNOSIS — E038 Other specified hypothyroidism: Secondary | ICD-10-CM

## 2016-08-01 DIAGNOSIS — I420 Dilated cardiomyopathy: Secondary | ICD-10-CM | POA: Diagnosis not present

## 2016-08-01 DIAGNOSIS — J9621 Acute and chronic respiratory failure with hypoxia: Secondary | ICD-10-CM | POA: Diagnosis not present

## 2016-08-01 DIAGNOSIS — I5033 Acute on chronic diastolic (congestive) heart failure: Secondary | ICD-10-CM | POA: Diagnosis not present

## 2016-08-01 DIAGNOSIS — E784 Other hyperlipidemia: Secondary | ICD-10-CM

## 2016-08-01 LAB — RESPIRATORY PANEL BY PCR
ADENOVIRUS-RVPPCR: NOT DETECTED
BORDETELLA PERTUSSIS-RVPCR: NOT DETECTED
CORONAVIRUS 229E-RVPPCR: NOT DETECTED
CORONAVIRUS HKU1-RVPPCR: NOT DETECTED
CORONAVIRUS NL63-RVPPCR: NOT DETECTED
Chlamydophila pneumoniae: NOT DETECTED
Coronavirus OC43: NOT DETECTED
Influenza A: NOT DETECTED
Influenza B: NOT DETECTED
METAPNEUMOVIRUS-RVPPCR: NOT DETECTED
Mycoplasma pneumoniae: NOT DETECTED
PARAINFLUENZA VIRUS 1-RVPPCR: NOT DETECTED
PARAINFLUENZA VIRUS 2-RVPPCR: NOT DETECTED
PARAINFLUENZA VIRUS 3-RVPPCR: NOT DETECTED
Parainfluenza Virus 4: NOT DETECTED
RHINOVIRUS / ENTEROVIRUS - RVPPCR: NOT DETECTED
Respiratory Syncytial Virus: NOT DETECTED

## 2016-08-01 LAB — GLUCOSE, CAPILLARY
GLUCOSE-CAPILLARY: 140 mg/dL — AB (ref 65–99)
GLUCOSE-CAPILLARY: 105 mg/dL — AB (ref 65–99)
GLUCOSE-CAPILLARY: 148 mg/dL — AB (ref 65–99)
Glucose-Capillary: 102 mg/dL — ABNORMAL HIGH (ref 65–99)

## 2016-08-01 LAB — BLOOD GAS, ARTERIAL
ACID-BASE EXCESS: 25.1 mmol/L — AB (ref 0.0–2.0)
BICARBONATE: 51.3 mmol/L — AB (ref 20.0–28.0)
DELIVERY SYSTEMS: POSITIVE
Drawn by: 441371
EXPIRATORY PAP: 6
FIO2: 80
Inspiratory PAP: 14
O2 Saturation: 82.3 %
PATIENT TEMPERATURE: 97.7
PH ART: 7.458 — AB (ref 7.350–7.450)
pCO2 arterial: 73.1 mmHg (ref 32.0–48.0)
pO2, Arterial: 47.5 mmHg — ABNORMAL LOW (ref 83.0–108.0)

## 2016-08-01 LAB — BASIC METABOLIC PANEL
Anion gap: 16 — ABNORMAL HIGH (ref 5–15)
BUN: 17 mg/dL (ref 6–20)
CHLORIDE: 85 mmol/L — AB (ref 101–111)
CO2: 40 mmol/L — ABNORMAL HIGH (ref 22–32)
CREATININE: 0.66 mg/dL (ref 0.44–1.00)
Calcium: 9.5 mg/dL (ref 8.9–10.3)
Glucose, Bld: 94 mg/dL (ref 65–99)
Potassium: 4.6 mmol/L (ref 3.5–5.1)
SODIUM: 141 mmol/L (ref 135–145)

## 2016-08-01 LAB — MAGNESIUM: MAGNESIUM: 1.6 mg/dL — AB (ref 1.7–2.4)

## 2016-08-01 LAB — INFLUENZA PANEL BY PCR (TYPE A & B)
INFLAPCR: NEGATIVE
INFLBPCR: NEGATIVE

## 2016-08-01 LAB — TROPONIN I
TROPONIN I: 0.04 ng/mL — AB (ref <0.03)
TROPONIN I: 0.05 ng/mL — AB (ref <0.03)

## 2016-08-01 LAB — HIV ANTIBODY (ROUTINE TESTING W REFLEX): HIV Screen 4th Generation wRfx: NONREACTIVE

## 2016-08-01 LAB — MRSA PCR SCREENING: MRSA by PCR: NEGATIVE

## 2016-08-01 LAB — STREP PNEUMONIAE URINARY ANTIGEN: STREP PNEUMO URINARY ANTIGEN: NEGATIVE

## 2016-08-01 LAB — TSH: TSH: 1.995 u[IU]/mL (ref 0.350–4.500)

## 2016-08-01 MED ORDER — IPRATROPIUM BROMIDE 0.02 % IN SOLN
0.5000 mg | Freq: Four times a day (QID) | RESPIRATORY_TRACT | Status: DC
  Administered 2016-08-01 – 2016-08-11 (×41): 0.5 mg via RESPIRATORY_TRACT
  Filled 2016-08-01 (×41): qty 2.5

## 2016-08-01 MED ORDER — METOPROLOL TARTRATE 5 MG/5ML IV SOLN
5.0000 mg | Freq: Four times a day (QID) | INTRAVENOUS | Status: DC
  Administered 2016-08-01 – 2016-08-02 (×4): 5 mg via INTRAVENOUS
  Filled 2016-08-01 (×4): qty 5

## 2016-08-01 NOTE — Progress Notes (Signed)
Nutrition Consult/Brief Note  RD consulted for COPD Gold Protocol.  Wt Readings from Last 15 Encounters:  08/01/16 254 lb (115.2 kg)  07/31/16 254 lb 3.2 oz (115.3 kg)  06/06/16 246 lb (111.6 kg)  04/17/16 254 lb 9.6 oz (115.5 kg)  02/29/16 250 lb 3.2 oz (113.5 kg)  11/23/15 223 lb (101.2 kg)  07/27/15 244 lb (110.7 kg)  06/15/15 241 lb (109.3 kg)  05/04/15 243 lb (110.2 kg)  02/01/15 233 lb (105.7 kg)  01/18/15 238 lb 8 oz (108.2 kg)  12/02/14 246 lb 6.4 oz (111.8 kg)  10/26/14 253 lb (114.8 kg)  10/18/14 253 lb 12.8 oz (115.1 kg)  09/27/14 250 lb (113.4 kg)    Body mass index is 43.6 kg/m. Patient meets criteria for Obesity Class III based on current BMI.   Current diet order is 2 gm Sodium.  Labs and medications reviewed.   No nutrition interventions warranted at this time. If nutrition issues arise, please consult RD.   Arthur Holms, RD, LDN Pager #: [phone] After-Hours Pager #: [phone]

## 2016-08-01 NOTE — Care Management Note (Signed)
Case Management Note  Patient Details  Name: Jillian Wood MRN: [id number] Date of Birth: 12-Jun-1946  Subjective/Objective:    Pt lives with family, has home O2 through [place].  Sats dropped on HFNC O2, remains on BiPAP at present,              Expected Discharge Plan:  [place]  Discharge planning Services  CM Consult  Status of Service:  In process, will continue to follow  Girard Cooter, RN 08/01/2016, 2:48 PM

## 2016-08-01 NOTE — Progress Notes (Signed)
RN asked RT to take pt off Bipap to try to eat breakfast. RT attempted high flow nasal cannula. Pt's sat dropped to 71%. Pt placed back on Bipap. RN notified.

## 2016-08-01 NOTE — Progress Notes (Signed)
PROGRESS NOTE    Jillian Wood  [id number] DOB: 18-Jul-1946 DOA: 07/31/2016 PCP: Alesia Richards, MD   Brief Narrative:  70 y.o. WF PMHx Depression, Anxiety HTN, PSVT, HLD,Diabetes mellitus type 2 in obese uncontrolled with complications, COPD, on 4 L oxygen, OSA, GERD, Hypothyroidism, Tobacco abuse, dCHF,   Who presents with shortness of breath.Patient states that she has been having worsening shortness of breath for 4 days. She has dry cough, no chest pain, fever or chills. She has mild runny nose, no sore throat. She has to increase oxygen use from 4 L to 8 L. She speaks in full sentence. Patient denies nausea, vomiting, abdominal pain, diarrhea, symptoms of UTI or unilateral weakness. She has mild leg edema bilaterally. No tenderness over calf areas.    Subjective: 11/16   A/Ox4, positive acute on chronic SOB, negative CP. States continues to smoke. Patient states refuses to use CPAP at night.    Assessment & Plan:   Principal Problem:   Acute on chronic respiratory failure with hypoxia (HCC) Active Problems:   Essential hypertension   Hyperlipidemia   Hypothyroidism   Paroxysmal SVT (supraventricular tachycardia) (HCC)   Acute on chronic diastolic CHF (congestive heart failure) (HCC)   GERD (gastroesophageal reflux disease)   COPD with acute exacerbation (HCC)   Atrial fibrillation with RVR (HCC)   Acute on chronic respiratory failure with hypoxia:  -Retropectoral to include COPD exacerbation, tobacco abuse, OSA noncompliant with CPAP, and CHF  -Complete 5 days of antibiotics -Ipratropium nebulizer QID -Xopenex QID -Solu-Medrol 60 mg TID -Flutter valve -BiPAP at night per respiratory therapy . -High flow O2 per respiratory therapy to maintain SPO2> 88% during the day  COPD exacerbation: -Nebulizers: scheduled Atrovent and prn xopenex neb -Solu-Medrol 60 mg IV tid -Oral azithromycin for 5 days.  -Mucinex for cough  -Urine S. pneumococcal  antigen -Follow up blood culture x2, sputum culture, respiratory virus panel, Flu pcr  Acute on chronic Diastolic CHF (congestive heart failure) ([place]): --strict I/O's since admission -240ml -Daily weight (unknown base weight) Filed Weights   08/01/16 1315  Weight: 115.2 kg (254 lb)  -Cardizem drip -Metoprolol IV 5 mg QID -Hold losartan. Once A. fib with RVR controlled restart if BP allows -Lasix IV 40 mg daily -Echocardiogram pending -TSH pending -Consider cardiology consult once all information obtained.  HTN: -See CHF  New onset Atrial fibrillation with RVR ([place]): (CHA2DS2-VASc Score is 4,) -likely triggered by CHF exacerbation and hypoxia.  - likely needs oral anticoagulation  HLD:  -Last LDL was 68 on 06/06/16 - Pravastatin 20 mg daily  Hypothyroidism:  -Last TSH was 6.25 on 06/06/16: TSH on 11/16 WNL -Synthroid 175 g daily  GERD: -Pepcid   DVT prophylaxis: Lovenox Code Status: Full Family Communication: Family present Disposition Plan: Home   Consultants:  None  Procedures/Significant Events:  None   VENTILATOR SETTINGS: None   Cultures 11/15 blood pending 11/15 respiratory virus panel negative 11/16 MRSA by PCR negative  Antimicrobials: Azithromycin 11/15>>   Devices    LINES / TUBES:      Continuous Infusions: . diltiazem (CARDIZEM) infusion 15 mg/hr (08/01/16 1232)     Objective: Vitals:   08/01/16 0902 08/01/16 1224 08/01/16 1226 08/01/16 1315  BP: 93/72  122/77   Pulse: (!) 136 (!) 137 (!) 137   Resp: (!) 21 (!) 21 19   Temp:   97.7 F (36.5 C)   TempSrc:   Axillary   SpO2: 95% 93% 99%   Weight:  115.2 kg (254 lb)  Height:    5\' 4"  (1.626 m)    Intake/Output Summary (Last 24 hours) at 08/01/16 1404 Last data filed at 08/01/16 [id number]  Gross per 24 hour  Intake                0 ml  Output              200 ml  Net             -200 ml   Filed Weights   08/01/16 1315  Weight: 115.2 kg (254 lb)     Examination:  General: A/O 4, positive acute on chronic respiratory distress Eyes: negative scleral hemorrhage, negative anisocoria, negative icterus ENT: Negative Runny nose, negative gingival bleeding, Neck:  Negative scars, masses, torticollis, lymphadenopathy, JVD Lungs: extremely poor air movement in all lung fields, without wheezes or crackles Cardiovascular: Irregular irregular rhythm and rate, without murmur gallop or rub normal S1 and S2 Abdomen: Morbidly obese, negative abdominal pain, nondistended, positive soft, bowel sounds, no rebound, no ascites, no appreciable mass Extremities: No significant cyanosis, clubbing, or edema bilateral lower extremities Skin: Negative rashes, lesions, ulcers Psychiatric:  Negative depression, negative anxiety, negative fatigue, negative mania  Central nervous system:  Cranial nerves II through XII intact, tongue/uvula midline, all extremities muscle strength 5/5, sensation intact throughout,negative dysarthria, negative expressive aphasia, negative receptive aphasia.  .     Data Reviewed: Care during the described time interval was provided by me .  I have reviewed this patient's available data, including medical history, events of note, physical examination, and all test results as part of my evaluation. I have personally reviewed and interpreted all radiology studies.  CBC:  Recent Labs Lab 07/31/16 1805  WBC 5.8  HGB 10.6*  HCT 40.5  MCV 92.5  PLT 123XX123   Basic Metabolic Panel:  Recent Labs Lab 07/31/16 1805  NA 142  K 4.3  CL 85*  CO2 49*  GLUCOSE 108*  BUN 9  CREATININE 0.62  CALCIUM 9.4   GFR: Estimated Creatinine Clearance: 81.5 mL/min (by C-G formula based on SCr of 0.62 mg/dL). Liver Function Tests: No results for input(s): AST, ALT, ALKPHOS, BILITOT, PROT, ALBUMIN in the last 168 hours. No results for input(s): LIPASE, AMYLASE in the last 168 hours. No results for input(s): AMMONIA in the last 168  hours. Coagulation Profile: No results for input(s): INR, PROTIME in the last 168 hours. Cardiac Enzymes:  Recent Labs Lab 07/31/16 2116 08/01/16 0257 08/01/16 0858  TROPONINI <0.03 0.04* 0.05*   BNP (last 3 results) No results for input(s): PROBNP in the last 8760 hours. HbA1C: No results for input(s): HGBA1C in the last 72 hours. CBG:  Recent Labs Lab 07/31/16 2233 08/01/16 0800 08/01/16 1220  GLUCAP 136* 140* 102*   Lipid Profile: No results for input(s): CHOL, HDL, LDLCALC, TRIG, CHOLHDL, LDLDIRECT in the last 72 hours. Thyroid Function Tests:  Recent Labs  08/01/16 0438  TSH 1.995   Anemia Panel: No results for input(s): VITAMINB12, FOLATE, FERRITIN, TIBC, IRON, RETICCTPCT in the last 72 hours. Urine analysis:    Component Value Date/Time   COLORURINE YELLOW 07/27/2015 1612   APPEARANCEUR CLEAR 07/27/2015 1612   LABSPEC 1.017 07/27/2015 1612   PHURINE 6.0 07/27/2015 1612   GLUCOSEU NEGATIVE 07/27/2015 1612   HGBUR 2+ (A) 07/27/2015 1612   BILIRUBINUR NEGATIVE 07/27/2015 1612   KETONESUR NEGATIVE 07/27/2015 1612   PROTEINUR NEGATIVE 07/27/2015 1612   UROBILINOGEN 1.0 10/23/2009 1145  NITRITE NEGATIVE 07/27/2015 1612   LEUKOCYTESUR NEGATIVE 07/27/2015 1612   Sepsis Labs: @LABRCNTIP (procalcitonin:4,lacticidven:4)  ) Recent Results (from the past 240 hour(s))  MRSA PCR Screening     Status: None   Collection Time: 08/01/16  7:05 AM  Result Value Ref Range Status   MRSA by PCR NEGATIVE NEGATIVE Final    Comment:        The GeneXpert MRSA Assay (FDA approved for NASAL specimens only), is one component of a comprehensive MRSA colonization surveillance program. It is not intended to diagnose MRSA infection nor to guide or monitor treatment for MRSA infections.          Radiology Studies: Dg Chest 2 View  Result Date: 07/31/2016 CLINICAL DATA:  Cough and dyspnea for 4 days. EXAM: CHEST  2 VIEW COMPARISON:  09/27/2014 chest radiograph  FINDINGS: The heart is top-normal in size. There is aortic atherosclerosis. Mild diffuse interstitial prominence consistent with interstitial edema is noted. There are small bilateral pleural effusions posteriorly blunting the costophrenic angles right greater than left. There is bibasilar atelectasis. No suspicious osseous lesions. IMPRESSION: Interstitial pulmonary edema with small pleural effusions and borderline cardiomegaly. Aortic atherosclerosis. Electronically Signed   By: Ashley Royalty M.D.   On: 07/31/2016 17:50        Scheduled Meds: . aspirin EC  81 mg Oral Daily  . azithromycin  250 mg Oral Daily  . cholecalciferol  1,000 Units Oral Daily  . DULoxetine  60 mg Oral Daily  . enoxaparin (LOVENOX) injection  40 mg Subcutaneous Q24H  . famotidine  20 mg Oral BID  . furosemide  40 mg Intravenous Daily  . guaiFENesin  600 mg Oral Daily  . insulin aspart  0-5 Units Subcutaneous QHS  . insulin aspart  0-9 Units Subcutaneous TID WC  . ipratropium  0.5 mg Nebulization Q6H  . levalbuterol  1.25 mg Nebulization Q6H  . levothyroxine  175 mcg Oral QAC breakfast  . losartan  50 mg Oral Daily  . methylPREDNISolone (SOLU-MEDROL) injection  60 mg Intravenous TID  . pravastatin  40 mg Oral Daily   Continuous Infusions: . diltiazem (CARDIZEM) infusion 15 mg/hr (08/01/16 1232)     LOS: 1 day    Time spent: 40 minutes    Artis Beggs, Geraldo Docker, MD [facility] Pager [phone]   If 7PM-7AM, please contact night-coverage [email or web address] Password [email or web address] 08/01/2016, 2:04 PM

## 2016-08-01 NOTE — Progress Notes (Signed)
Pt is on NIV at this time tolerating it well. SATs are improving minimally. Low at 70's now 84%. 84% is the highest sats I am able to obtain. HOB the bed is raise etc. Pt is  On 100% FiO2 and EPAP of 8. Both increase per desaturation. Pt has very poor O2 reserve, and poor O2 diffusion. Significant Hx: COPD. RT will monitor patient status

## 2016-08-01 NOTE — ED Notes (Signed)
Pt moved to a bed w/ an air mattress to assist w/ pain control.  Pt reports she is much more comfortable.

## 2016-08-01 NOTE — Progress Notes (Signed)
PT Cancellation Note  Patient Details Name: Jillian Wood MRN: [id number] DOB: [birth year]   Cancelled Treatment:    Reason Eval/Treat Not Completed: Patient not medically ready. Pt currently requiring bipap.   Shary Decamp [place] 08/01/2016, 1:17 PM [place]

## 2016-08-01 NOTE — Progress Notes (Signed)
CRITICAL VALUE ALERT  Critical value received:  pH 7.458; CO2 73.1; pO2 47.5; HCO3 51.3; Base excess 25.1  Date of notification: 08/01/2016   Time of notification:  [id number]  Critical value read back: yes  Nurse who received alert:  Donnella Bi, RN  MD notified (1st page):  Dr. Dia Crawford  Time of first page: [phone]  MD notified (2nd page):  Time of second page:  Responding MD: Seen by MD  Time MD responded:  [phone]

## 2016-08-01 NOTE — Progress Notes (Signed)
Pt is on NIV at this time tolerating it well. Neb given aerogen

## 2016-08-01 NOTE — ED Notes (Signed)
RN took bipap off for a minute so that pt could have a drink of water.  Pt very thankful. Bipap placed back on pt.

## 2016-08-01 NOTE — Progress Notes (Signed)
OT Cancellation Note  Patient Details Name: Jillian Wood MRN: [id number] DOB: 05-29-1946   Cancelled Treatment:    Reason Eval/Treat Not Completed: Patient not medically ready (requiring bipap). Will follow.  [name] 08/01/2016, 3:41 PM  [phone]

## 2016-08-02 ENCOUNTER — Encounter (HOSPITAL_COMMUNITY): Payer: Self-pay | Admitting: Cardiology

## 2016-08-02 ENCOUNTER — Inpatient Hospital Stay (HOSPITAL_COMMUNITY): Payer: PPO

## 2016-08-02 DIAGNOSIS — I509 Heart failure, unspecified: Secondary | ICD-10-CM

## 2016-08-02 DIAGNOSIS — R7309 Other abnormal glucose: Secondary | ICD-10-CM | POA: Diagnosis present

## 2016-08-02 DIAGNOSIS — I4891 Unspecified atrial fibrillation: Secondary | ICD-10-CM | POA: Diagnosis not present

## 2016-08-02 DIAGNOSIS — J9621 Acute and chronic respiratory failure with hypoxia: Secondary | ICD-10-CM | POA: Diagnosis not present

## 2016-08-02 DIAGNOSIS — E1169 Type 2 diabetes mellitus with other specified complication: Secondary | ICD-10-CM

## 2016-08-02 DIAGNOSIS — N183 Chronic kidney disease, stage 3 (moderate): Secondary | ICD-10-CM

## 2016-08-02 DIAGNOSIS — I5033 Acute on chronic diastolic (congestive) heart failure: Secondary | ICD-10-CM | POA: Diagnosis not present

## 2016-08-02 DIAGNOSIS — I483 Typical atrial flutter: Secondary | ICD-10-CM | POA: Diagnosis not present

## 2016-08-02 DIAGNOSIS — J441 Chronic obstructive pulmonary disease with (acute) exacerbation: Secondary | ICD-10-CM | POA: Diagnosis not present

## 2016-08-02 DIAGNOSIS — Z720 Tobacco use: Secondary | ICD-10-CM

## 2016-08-02 DIAGNOSIS — E1122 Type 2 diabetes mellitus with diabetic chronic kidney disease: Secondary | ICD-10-CM

## 2016-08-02 DIAGNOSIS — E669 Obesity, unspecified: Secondary | ICD-10-CM

## 2016-08-02 LAB — GLUCOSE, CAPILLARY
GLUCOSE-CAPILLARY: 152 mg/dL — AB (ref 65–99)
GLUCOSE-CAPILLARY: 177 mg/dL — AB (ref 65–99)
Glucose-Capillary: 186 mg/dL — ABNORMAL HIGH (ref 65–99)
Glucose-Capillary: 161 mg/dL — ABNORMAL HIGH (ref 65–99)
Glucose-Capillary: 189 mg/dL — ABNORMAL HIGH (ref 65–99)

## 2016-08-02 LAB — BASIC METABOLIC PANEL
Anion gap: 12 (ref 5–15)
BUN: 18 mg/dL (ref 6–20)
CHLORIDE: 85 mmol/L — AB (ref 101–111)
CO2: 43 mmol/L — ABNORMAL HIGH (ref 22–32)
CREATININE: 0.66 mg/dL (ref 0.44–1.00)
Calcium: 9.3 mg/dL (ref 8.9–10.3)
Glucose, Bld: 157 mg/dL — ABNORMAL HIGH (ref 65–99)
POTASSIUM: 4.4 mmol/L (ref 3.5–5.1)
SODIUM: 140 mmol/L (ref 135–145)

## 2016-08-02 LAB — BLOOD GAS, ARTERIAL
Acid-Base Excess: 22 mmol/L — ABNORMAL HIGH (ref 0.0–2.0)
Bicarbonate: 48.6 mmol/L — ABNORMAL HIGH (ref 20.0–28.0)
DELIVERY SYSTEMS: POSITIVE
Drawn by: 441661
EXPIRATORY PAP: 8
FIO2: 100
INSPIRATORY PAP: 16
O2 SAT: 92.7 %
Patient temperature: 98.6
pCO2 arterial: 83.8 mmHg (ref 32.0–48.0)
pH, Arterial: 7.381 (ref 7.350–7.450)
pO2, Arterial: 73.9 mmHg — ABNORMAL LOW (ref 83.0–108.0)

## 2016-08-02 LAB — ECHOCARDIOGRAM COMPLETE
HEIGHTINCHES: 64 in
WEIGHTICAEL: 3712 [oz_av]

## 2016-08-02 LAB — MAGNESIUM: MAGNESIUM: 1.9 mg/dL (ref 1.7–2.4)

## 2016-08-02 MED ORDER — METOPROLOL TARTRATE 25 MG PO TABS
25.0000 mg | ORAL_TABLET | Freq: Two times a day (BID) | ORAL | Status: DC
  Administered 2016-08-03 – 2016-08-11 (×18): 25 mg via ORAL
  Filled 2016-08-02 (×18): qty 1

## 2016-08-02 MED ORDER — APIXABAN 5 MG PO TABS
5.0000 mg | ORAL_TABLET | Freq: Two times a day (BID) | ORAL | Status: DC
  Administered 2016-08-02 – 2016-08-07 (×10): 5 mg via ORAL
  Filled 2016-08-02 (×11): qty 1

## 2016-08-02 MED ORDER — DILTIAZEM HCL 60 MG PO TABS
90.0000 mg | ORAL_TABLET | Freq: Four times a day (QID) | ORAL | Status: DC
  Administered 2016-08-02 – 2016-08-07 (×22): 90 mg via ORAL
  Filled 2016-08-02 (×23): qty 1

## 2016-08-02 MED ORDER — METOPROLOL TARTRATE 5 MG/5ML IV SOLN
INTRAVENOUS | Status: AC
  Administered 2016-08-02: 5 mg
  Filled 2016-08-02: qty 5

## 2016-08-02 MED ORDER — NICOTINE 14 MG/24HR TD PT24
14.0000 mg | MEDICATED_PATCH | Freq: Every day | TRANSDERMAL | Status: DC
  Administered 2016-08-02 – 2016-08-11 (×10): 14 mg via TRANSDERMAL
  Filled 2016-08-02 (×10): qty 1

## 2016-08-02 NOTE — Evaluation (Signed)
Occupational Therapy Evaluation Patient Details Name: ANAMARIS SARANTOS MRN: [id number] DOB: 1946-01-14 Today's Date: 08/02/2016    History of Present Illness Pt admitted with acute on chronic respiratory failure with hypoxia requiring Bipap. She uses 4-6L 02 at home depending on activity level. PMH: COPD, tobacco abuse, CHF, obesity, DM, HTN, anxiety and depression.   Clinical Impression   Pt reports ambulating with a walker and having assistance for LB bathing and dressing at times. Her family assists with IADL. Pt presents with generalized weakness, decreased activity tolerance and impaired balance interfering with ability to perform ADL and mobility at her baseline. VS monitored closely throughout session. Limited OOB activity to bed>chair due to pt on high flow 02. Will follow acutely.    Follow Up Recommendations  Home health OT    Equipment Recommendations  3 in 1 bedside comode (extra wide)    Recommendations for Other Services       Precautions / Restrictions Precautions Precautions: Fall Precaution Comments: watch 02 sats      Mobility Bed Mobility Overal bed mobility: Needs Assistance Bed Mobility: Supine to Sit     Supine to sit: Mod assist;HOB elevated     General bed mobility comments: assist to raise trunk  Transfers Overall transfer level: Needs assistance   Transfers: Stand Pivot Transfers;Sit to/from Stand Sit to Stand: Min assist;+2 safety/equipment Stand pivot transfers: Min assist;+2 safety/equipment       General transfer comment: assist to rise and to steady    Balance Overall balance assessment: Needs assistance Sitting-balance support: Feet supported Sitting balance-Leahy Scale: Fair Sitting balance - Comments: pt with dizziness at EOB initially     Standing balance-Leahy Scale: Poor                              ADL Overall ADL's : Needs assistance/impaired Eating/Feeding: Set up;Sitting   Grooming: Wash/dry  hands;Sitting;Set up   Upper Body Bathing: Minimal assitance;Sitting   Lower Body Bathing: Sit to/from stand;Maximal assistance   Upper Body Dressing : Minimal assistance;Sitting Upper Body Dressing Details (indicate cue type and reason): due to lines Lower Body Dressing: Maximal assistance;Sit to/from stand   Toilet Transfer: Minimal assistance;+2 for safety/equipment;BSC;Stand-pivot             General ADL Comments: Pt on high flow oxygen.     Vision     Perception     Praxis      Pertinent Vitals/Pain Pain Assessment: Faces Faces Pain Scale: No hurt     Hand Dominance Right   Extremity/Trunk Assessment Upper Extremity Assessment Upper Extremity Assessment: Overall WFL for tasks assessed   Lower Extremity Assessment Lower Extremity Assessment: Defer to PT evaluation       Communication Communication Communication: No difficulties   Cognition Arousal/Alertness: Awake/alert Behavior During Therapy: WFL for tasks assessed/performed Overall Cognitive Status: Within Functional Limits for tasks assessed                     General Comments       Exercises       Shoulder Instructions      Home Living Family/patient expects to be discharged to:: Private residence Living Arrangements: Children;Spouse/significant other (2 sons, one is disabled) Available Help at Discharge: Family;Available 24 hours/day Type of Home: House Home Access: Ramped entrance     Home Layout: One level     Bathroom Shower/Tub: Occupational psychologist: Standard  Home Equipment: Kasandra Knudsen - single point;Shower seat;Walker - 4 wheels          Prior Functioning/Environment Level of Independence: Needs assistance  Gait / Transfers Assistance Needed: walks with a cane ADL's / Homemaking Assistance Needed: assist for LB ADL intermittently, showers in sitting, dependent in IADL            OT Problem List: Decreased strength;Decreased activity  tolerance;Impaired balance (sitting and/or standing);Obesity;Decreased knowledge of use of DME or AE;Cardiopulmonary status limiting activity   OT Treatment/Interventions: Self-care/ADL training;DME and/or AE instruction;Patient/family education;Energy conservation    OT Goals(Current goals can be found in the care plan section) Acute Rehab OT Goals Patient Stated Goal: return home with her family OT Goal Formulation: With patient Time For Goal Achievement: 08/16/16 Potential to Achieve Goals: Good ADL Goals Pt Will Perform Grooming: with supervision;standing (two activities) Pt Will Perform Lower Body Bathing: with supervision;sit to/from stand;with adaptive equipment Pt Will Perform Lower Body Dressing: with supervision;with adaptive equipment;sit to/from stand Pt Will Transfer to Toilet: with supervision;ambulating;bedside commode (over toilet) Pt Will Perform Toileting - Clothing Manipulation and hygiene: with supervision;sit to/from stand Additional ADL Goal #1: Pt will perform bed mobility with supervision. Additional ADL Goal #2: Pt will generalize energy conservation strategies in ADL and mobility.  OT Frequency: Min 2X/week   Barriers to D/C:            Co-evaluation PT/OT/SLP Co-Evaluation/Treatment: Yes Reason for Co-Treatment: For patient/therapist safety   OT goals addressed during session: ADL's and self-care      End of Session Equipment Utilized During Treatment: Rolling walker;Gait belt;Oxygen Nurse Communication: Mobility status  Activity Tolerance: Patient limited by fatigue Patient left: in chair;with call bell/phone within reach;with chair alarm set;with nursing/sitter in room   Time: 1020-1047 OT Time Calculation (min): 27 min Charges:  OT General Charges $OT Visit: 1 Procedure OT Evaluation $OT Eval Moderate Complexity: 1 Procedure G-Codes:    [name] 08/02/2016, 12:33 PM  [phone]

## 2016-08-02 NOTE — Progress Notes (Signed)
Pt is HFNC at this time tolerating it well.

## 2016-08-02 NOTE — Progress Notes (Signed)
Progress Note    Jillian Wood  [id number] DOB: 1945-12-14  DOA: 07/31/2016 PCP: Alesia Richards, MD    Brief Narrative:   Chief complaint: Follow-up COPD exacerbation  Jillian Wood is an 70 y.o. 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  Assessment/Plan:   Principal Problem:   Acute on chronic respiratory failure with hypoxia (HCC)Secondary to COPD with acute exacerbation in the setting of ongoing tobacco abuse Respiratory failure multifactorial including COPD exacerbation in setting of tobacco abuse, OSA noncompliant with CPAP and possible CHF in the setting of atrial flutter. Continue nebulizer treatments, Solu-Medrol, flutter valve, BiPAP at night per respiratory, and high flow oxygen to maintain oxygen saturations. Continue azithromycin for 5 day course of therapy. Counseled regarding tobacco cessation and placed on a nicotine patch. Wean steroids as tolerated. Continue Mucinex. Respiratory virus panel negative.  Active Problems:   Atrial fibrillation/flutter/paroxysmal SVT/atrial fibrillation with RVR/demand ischemia Rate controlled on Cardizem drip at 15 mg/hour. Needs anticoagulation with CHA2DS2VASc of 4. Cardiology consulted. Eliquis started. Recommendations noted: Change IV Cardizem to by mouth, and change IV Lopressor to by mouth. Mild troponin elevation consistent with demand ischemia noted.    Essential hypertension Currently controlled on Cardizem and Lopressor.    Hyperlipidemia Continue Pravachol.    Hypothyroidism Continue Synthroid. TSH 1.995.    Acute on chronic diastolic CHF (congestive heart failure) ([place]) Placed on IV diuretics with Lasix 40 mg  daily. 2-D echo showed EF 65-70 percent with no regional wall motion abnormalities. Heart failure symptoms likely triggered by rapid A. fib/flutter. Rate control should improve symptoms.    GERD (gastroesophageal reflux disease) Continue Pepcid.    Type 2 diabetes in obese Currently being managed with insulin sensitive SSI. CBGs 102-152.   Family Communication/Anticipated D/C date and plan/Code Status   DVT prophylaxis: Eliquis ordered. Code Status: Full Code.  Family Communication: No family at the bedside. Disposition Plan: Home in 48-72 hours if stable.   Medical Consultants:    Cardiology   Procedures:    2-D echo 08/02/16: EF 65-70 percent, no regional wall motion abnormalities, aortic valve stenosis.  Anti-Infectives:    Azithromycin 07/31/16--->  Subjective:   The patient reports that She continues to have some shortness of breath but it has improved. Review of symptoms is positive for shortness of breath, non-productive cough and negative for chest pain, nausea or vomiting.  Objective:    Vitals:   08/02/16 0405 08/02/16 0723 08/02/16 0843 08/02/16 0911  BP:   105/66   Pulse:  73 96   Resp:  20 15   Temp:   98.1 F (36.7 C)   TempSrc:   Axillary   SpO2:  93% 92% 92%  Weight: 105.2 kg (232 lb)     Height:        Intake/Output Summary (Last 24 hours) at 08/02/16 0937 Last data filed at 08/02/16 0900  Gross per 24 hour  Intake              315 ml  Output             1050 ml  Net             -735 ml   Filed  Weights   08/01/16 1315 08/02/16 0405  Weight: 115.2 kg (254 lb) 105.2 kg (232 lb)    Exam: General exam: Appears calm and comfortable.  Respiratory system: Diminished with bilateral rhonchi. Respiratory effort mildly increased. Cardiovascular system: HSIR.  No JVD,  rubs, gallops or clicks. No murmurs. Gastrointestinal system: Abdomen is nondistended, soft and nontender. No organomegaly or masses felt. Normal bowel sounds heard. Central  nervous system: Alert and oriented. No focal neurological deficits. Extremities: No clubbing,  or cyanosis. Trace edema. Skin: No rashes, lesions or ulcers. Psychiatry: Judgement and insight appear normal. Mood & affect appropriate.   Data Reviewed:   I have personally reviewed following labs and imaging studies:  Labs: Basic Metabolic Panel:  Recent Labs Lab 07/31/16 1805 08/01/16 1544 08/02/16 0238  NA 142 141 140  K 4.3 4.6 4.4  CL 85* 85* 85*  CO2 49* 40* 43*  GLUCOSE 108* 94 157*  BUN 9 17 18   CREATININE 0.62 0.66 0.66  CALCIUM 9.4 9.5 9.3  MG  --  1.6* 1.9   GFR Estimated Creatinine Clearance: 77.4 mL/min (by C-G formula based on SCr of 0.66 mg/dL).  CBC:  Recent Labs Lab 07/31/16 1805  WBC 5.8  HGB 10.6*  HCT 40.5  MCV 92.5  PLT 237   Cardiac Enzymes:  Recent Labs Lab 07/31/16 2116 08/01/16 0257 08/01/16 0858  TROPONINI <0.03 0.04* 0.05*   CBG:  Recent Labs Lab 08/01/16 0800 08/01/16 1220 08/01/16 1744 08/01/16 2145 08/02/16 0847  GLUCAP 140* 102* 105* 148* 152*   Thyroid function studies:  Recent Labs  08/01/16 0438  TSH 1.995    Microbiology Recent Results (from the past 240 hour(s))  Respiratory Panel by PCR     Status: None   Collection Time: 07/31/16 10:25 PM  Result Value Ref Range Status   Adenovirus NOT DETECTED NOT DETECTED Final   Coronavirus 229E NOT DETECTED NOT DETECTED Final   Coronavirus HKU1 NOT DETECTED NOT DETECTED Final   Coronavirus NL63 NOT DETECTED NOT DETECTED Final   Coronavirus OC43 NOT DETECTED NOT DETECTED Final   Metapneumovirus NOT DETECTED NOT DETECTED Final   Rhinovirus / Enterovirus NOT DETECTED NOT DETECTED Final   Influenza A NOT DETECTED NOT DETECTED Final   Influenza B NOT DETECTED NOT DETECTED Final   Parainfluenza Virus 1 NOT DETECTED NOT DETECTED Final   Parainfluenza Virus 2 NOT DETECTED NOT DETECTED Final   Parainfluenza Virus 3 NOT DETECTED NOT DETECTED Final   Parainfluenza Virus 4  NOT DETECTED NOT DETECTED Final   Respiratory Syncytial Virus NOT DETECTED NOT DETECTED Final   Bordetella pertussis NOT DETECTED NOT DETECTED Final   Chlamydophila pneumoniae NOT DETECTED NOT DETECTED Final   Mycoplasma pneumoniae NOT DETECTED NOT DETECTED Final  MRSA PCR Screening     Status: None   Collection Time: 08/01/16  7:05 AM  Result Value Ref Range Status   MRSA by PCR NEGATIVE NEGATIVE Final    Comment:        The GeneXpert MRSA Assay (FDA approved for NASAL specimens only), is one component of a comprehensive MRSA colonization surveillance program. It is not intended to diagnose MRSA infection nor to guide or monitor treatment for MRSA infections.     Radiology: Dg Chest 2 View  Result Date: 07/31/2016 CLINICAL DATA:  Cough and dyspnea for 4 days. EXAM: CHEST  2 VIEW COMPARISON:  09/27/2014 chest radiograph FINDINGS: The heart is top-normal in size. There is aortic atherosclerosis. Mild diffuse interstitial prominence consistent with interstitial  edema is noted. There are small bilateral pleural effusions posteriorly blunting the costophrenic angles right greater than left. There is bibasilar atelectasis. No suspicious osseous lesions. IMPRESSION: Interstitial pulmonary edema with small pleural effusions and borderline cardiomegaly. Aortic atherosclerosis. Electronically Signed   By: Ashley Royalty M.D.   On: 07/31/2016 17:50    Medications:   . aspirin EC  81 mg Oral Daily  . azithromycin  250 mg Oral Daily  . cholecalciferol  1,000 Units Oral Daily  . DULoxetine  60 mg Oral Daily  . enoxaparin (LOVENOX) injection  40 mg Subcutaneous Q24H  . famotidine  20 mg Oral BID  . furosemide  40 mg Intravenous Daily  . guaiFENesin  600 mg Oral Daily  . insulin aspart  0-5 Units Subcutaneous QHS  . insulin aspart  0-9 Units Subcutaneous TID WC  . ipratropium  0.5 mg Nebulization Q6H  . levalbuterol  1.25 mg Nebulization Q6H  . levothyroxine  175 mcg Oral QAC breakfast  .  methylPREDNISolone (SOLU-MEDROL) injection  60 mg Intravenous TID  . metoprolol  5 mg Intravenous Q6H  . pravastatin  40 mg Oral Daily   Continuous Infusions: . diltiazem (CARDIZEM) infusion 15 mg/hr (08/02/16 0239)    Medical decision making is of high complexity and this patient is at high risk of deterioration, therefore this is a level 3 visit.    LOS: 2 days   RAMA,CHRISTINA  [facility] Pager [phone]. If unable to reach me by pager, please call my cell phone at [phone].  *Please refer to [email or web address], password [email or web address] to get updated schedule on who will round on this patient, as hospitalists switch teams weekly. If 7PM-7AM, please contact night-coverage at [email or web address], password [email or web address] for any overnight needs.  08/02/2016, 9:37 AM

## 2016-08-02 NOTE — Progress Notes (Signed)
Pt is still on NIV tolerating it well, have to keep adjusting mask, cause pt keep wiggling it half off her face. Good volumes are achieved. Couldn't o2 wean or peep wean. Pt sats are 86-87%. Pt is hemodynamical stable at this time. No distress or complications noted.

## 2016-08-02 NOTE — Evaluation (Signed)
Physical Therapy Evaluation Patient Details Name: Jillian Wood MRN: [id number] DOB: June 18, 1946 Today's Date: 08/02/2016   History of Present Illness  Pt admitted with acute on chronic respiratory failure with hypoxia requiring Bipap. She uses 4-6L 02 at home depending on activity level. PMH: COPD, tobacco abuse, CHF, obesity, DM, HTN, anxiety and depression.  Clinical Impression  Pt admitted with above diagnosis and presents to PT with functional limitations due to deficits listed below (See PT problem list). Pt needs skilled PT to maximize independence and safety to allow discharge to home with family. Pulmonary status is primary limiting factor for pt.     Follow Up Recommendations Home health PT;Supervision for mobility/OOB    Equipment Recommendations  None recommended by PT    Recommendations for Other Services       Precautions / Restrictions Precautions Precautions: Fall Precaution Comments: watch 02 sats Restrictions Weight Bearing Restrictions: No      Mobility  Bed Mobility Overal bed mobility: Needs Assistance Bed Mobility: Supine to Sit     Supine to sit: Mod assist;HOB elevated     General bed mobility comments: assist to raise trunk  Transfers Overall transfer level: Needs assistance Equipment used: Rolling walker (2 wheeled) Transfers: Sit to/from [facility] Sit to Stand: Min assist;+2 safety/equipment Stand pivot transfers: Min assist;+2 safety/equipment       General transfer comment: assist to rise and to steady. Verbal cues for hand placement. Assist to pivot to chair with rolling walker. Pt on high flow O2 and SpO2 to 87%.  Ambulation/Gait                Stairs            Wheelchair Mobility    Modified Rankin (Stroke Patients Only)       Balance Overall balance assessment: Needs assistance Sitting-balance support: Feet supported;No upper extremity supported Sitting balance-Leahy Scale: Fair Sitting  balance - Comments: pt with dizziness at EOB initially   Standing balance support: Bilateral upper extremity supported Standing balance-Leahy Scale: Poor Standing balance comment: Walker and min A for static standing                             Pertinent Vitals/Pain Pain Assessment: Faces Faces Pain Scale: No hurt    Home Living Family/patient expects to be discharged to:: Private residence Living Arrangements: Children;Spouse/significant other (2 sons, one is disabled) Available Help at Discharge: Family;Available 24 hours/day Type of Home: House Home Access: Ramped entrance     Home Layout: One level Home Equipment: Cane - single point;Shower seat;Walker - 4 wheels      Prior Function Level of Independence: Needs assistance   Gait / Transfers Assistance Needed: walks with a cane  ADL's / Homemaking Assistance Needed: assist for LB ADL intermittently, showers in sitting, dependent in IADL        Hand Dominance   Dominant Hand: Right    Extremity/Trunk Assessment   Upper Extremity Assessment: Defer to OT evaluation           Lower Extremity Assessment: Generalized weakness         Communication   Communication: No difficulties  Cognition Arousal/Alertness: Awake/alert Behavior During Therapy: WFL for tasks assessed/performed Overall Cognitive Status: Within Functional Limits for tasks assessed                      General Comments      Exercises  Assessment/Plan    PT Assessment Patient needs continued PT services  PT Problem List Decreased strength;Decreased activity tolerance;Decreased balance;Decreased mobility;Decreased knowledge of use of DME;Cardiopulmonary status limiting activity          PT Treatment Interventions DME instruction;Gait training;Functional mobility training;Therapeutic activities;Therapeutic exercise;Balance training;Patient/family education    PT Goals (Current goals can be found in the Care Plan  section)  Acute Rehab PT Goals Patient Stated Goal: return home with her family PT Goal Formulation: With patient Time For Goal Achievement: 08/16/16 Potential to Achieve Goals: Good    Frequency Min 3X/week   Barriers to discharge        Co-evaluation PT/OT/SLP Co-Evaluation/Treatment: Yes Reason for Co-Treatment: For patient/therapist safety PT goals addressed during session: Mobility/safety with mobility OT goals addressed during session: ADL's and self-care       End of Session Equipment Utilized During Treatment: Gait belt;Oxygen Activity Tolerance: Patient limited by fatigue Patient left: in chair;with call bell/phone within reach;with chair alarm set Nurse Communication: Mobility status         Time: [id number] PT Time Calculation (min) (ACUTE ONLY): 24 min   Charges:   PT Evaluation $PT Eval Moderate Complexity: 1 Procedure     PT G CodesShary Decamp [place] 08/21/16, 2:39 PM [employer] PT [phone]

## 2016-08-02 NOTE — Progress Notes (Signed)
Critical ABG results verbally given to RN.  

## 2016-08-02 NOTE — Care Management Note (Signed)
Case Management Note  Patient Details  Name: Jillian Wood MRN: [id number] Date of Birth: May 05, 1946  Subjective/Objective:   Patient lives with spouse and son, she has home oxygen with [employer], 5 liters continously.  She states she will need a wide bsc. , she does not want hhpt/hhot.  NCM will cont to follow for dc needs.                  Action/Plan:   Expected Discharge Date:                  Expected Discharge Plan:  [place]  In-House Referral:     Discharge planning Services  CM Consult  Post Acute Care Choice:    Choice offered to:     DME Arranged:    DME Agency:     HH Arranged:  Patient Refused [place] Agency:     Status of Service:  In process, will continue to follow  If discussed at Long Length of Stay Meetings, dates discussed:    Additional Comments:  Zenon Mayo, RN 08/02/2016, 4:27 PM

## 2016-08-02 NOTE — Progress Notes (Signed)
Pt is on NIV at this time tolerating it well.  

## 2016-08-02 NOTE — Progress Notes (Signed)
2D echocardiogram has been performed. 

## 2016-08-02 NOTE — Consult Note (Signed)
Reason for Consult: atrial fib   Referring Physician: Dr. Rockne Menghini   PCP:  Alesia Richards, MD  Primary Cardiologist:Dr. Gwenlyn Found EP Dr. Smitty Knudsen Jillian Wood is an 70 y.o. female.    Chief Complaint: admitted 07/31/16  For cough and increased SOB with need to increase home O2 fro 4 L to 8 L  HPI:  33 yof with hx of PSVT and hx of chest pain and dCHF.  Previous nuc study 2015 that had no ischemia but with SVT during the study the chest pain was reproduced.  Previous echo wit  EF 65-70% G1DD.  LA mildly dilated.  She has COPD on home oxygen, continues to smoke, OSA non compliant and DM-2.  Since beginning [facility], pt has had no PSVT for 2 years.  Now admitted with increased SOB, BNP of 448, and tachycardia to 151.  Pt was placed on BiPap after desat on [place].  Admitted with acute on chronic resp. failure and diastolic HF.  Initial EKG ST at 115 with RBBB and LPFB; the RBBB and LPFB are intermittent with past EKGs.  After arrival pt developed A flutter with 2:1 conduction.  She remains in flutter with improved rate at times.  CHA2DS2VASC score 4 for HTN, Age, female. Not on anticoagulation.     TSH 1.995 mg+ was low but now 1.9.    troponin 0.1; <0.03; 0.04; and 0.05.    CXR interstitial pulmonary edema with small pl. Effusions and borderline cardiomegaly and aortic atherosclerosis.   She has been diuresed and is negative 1,435. Wt is down from 254 to 232 ?Marland Kitchen  She is now on steroids. She is on BiPAP at night, and high flow oxygen.  Echo today with EF 65-70% with moderate LVH. calcified aortic valve  And mild stenosis. Moderately calcified MV, LA is severely dilated and RA was mldly dilated.  PK PA pressure in 70 mmHg.   Currently resting comfortably in bed. She has had no chest pain.  Overall feeling better.  Really not aware of heart rate.  Her son mentions that in the 2-3 days prior to admit she did have some episodes of dizziness when she would lie down.  Otherwise she had  been doing well.      Past Medical History:  Diagnosis Date  . Arthritis   . COPD (chronic obstructive pulmonary disease) ([place])   . Depression   . Diabetes mellitus type 2 in obese ([place])   . GERD (gastroesophageal reflux disease)   . Hyperlipidemia   . Hypertension   . Morbid obesity ([place])   . OAB (overactive bladder)   . PSVT (paroxysmal supraventricular tachycardia) ([place])   . Thyroid disease   . Vitamin D deficiency     Past Surgical History:  Procedure Laterality Date  . CARPAL TUNNEL RELEASE     L 1992 R 1984  . EYE SURGERY Bilateral    cataract  . NM MYOVIEW LTD  01/2011   Dobutamine Myoview: Negative perfusion scan for ischemia / infarct (poor image capture); Pt developed SVT with Dobutamine that reproduced her CP as it resolved with restoration of NSR.  Marland Kitchen PUBOVAGINAL SLING    . TONSILLECTOMY AND ADENOIDECTOMY    . TRANSTHORACIC ECHOCARDIOGRAM  01/2011   Hyperdynamic LV with EF 65-70%, Gr 1 DD, Mild Ao Stenosis - mean gradient ~19 mmHg    Family History  Problem Relation Age of Onset  . Alcohol abuse Father   . Heart disease Father  Social History:  reports that she has been smoking Cigarettes.  She has a 22.00 pack-year smoking history. She has never used smokeless tobacco. She reports that she does not drink alcohol or use drugs.  Allergies:  Allergies  Allergen Reactions  . Inderal [Propranolol] Other (See Comments)    Hair loss  . Ace Inhibitors Cough    OUTPATIENT MEDICATIONS: No current facility-administered medications on file prior to encounter.    Current Outpatient Prescriptions on File Prior to Encounter  Medication Sig Dispense Refill  . acetaminophen-codeine (TYLENOL #3) 300-30 MG per tablet Take 1 tablet by mouth every 8 (eight) hours as needed for moderate pain or severe pain. 90 tablet 0  . acetaZOLAMIDE (DIAMOX) 250 MG tablet Take 1 tablet (250 mg total) by mouth 3 (three) times daily. (Patient taking differently: Take 250 mg by mouth 2  (two) times daily. ) 270 tablet 0  . albuterol (PROVENTIL HFA;VENTOLIN HFA) 108 (90 Base) MCG/ACT inhaler Inhale 1-2 puffs into the lungs every 6 (six) hours as needed for wheezing or shortness of breath. 18 g 2  . aspirin 81 MG tablet Take 81 mg by mouth daily.    . budesonide-formoterol (SYMBICORT) 160-4.5 MCG/ACT inhaler 2 puffs twice a day, wash mouth afterwards (Patient taking differently: Inhale 2 puffs into the lungs 2 (two) times daily as needed. ) 1 Inhaler 12  . Cholecalciferol (VITAMIN D PO) Take 2,000 Units by mouth daily.     Marland Kitchen diltiazem (CARDIZEM) 60 MG tablet TAKE 1 TABLET (60 MG TOTAL) BY MOUTH 2 (TWO) TIMES DAILY. 180 tablet 3  . DULoxetine (CYMBALTA) 60 MG capsule TAKE 1 CAPSULE BY MOUTH DAILY 90 capsule 0  . famotidine (PEPCID) 20 MG tablet TAKE 1 TABLET (20 MG TOTAL) BY MOUTH 2 (TWO) TIMES DAILY. FOR ACID REFLUX 180 tablet 0  . guaiFENesin (MUCINEX) 600 MG 12 hr tablet Take 600 mg by mouth daily.    Marland Kitchen levothyroxine (SYNTHROID, LEVOTHROID) 175 MCG tablet Take 1 tablet (175 mcg total) by mouth daily before breakfast. 90 tablet 0  . loratadine-pseudoephedrine (CLARITIN-D 24-HOUR) 10-240 MG per 24 hr tablet Take 1 tablet by mouth as needed for allergies.     Marland Kitchen losartan (COZAAR) 50 MG tablet TAKE 1 TABLET (50 MG TOTAL) BY MOUTH DAILY. 90 tablet 3  . Magnesium Chloride (MAGNESIUM DR PO) Take 1 capsule by mouth daily.     . metFORMIN (GLUCOPHAGE-XR) 500 MG 24 hr tablet TAKE 1 TABLET (500 MG TOTAL) BY MOUTH 4 (FOUR) TIMES DAILY - AFTER MEALS AND AT BEDTIME. 360 tablet 1  . OXYGEN-HELIUM IN Inhale 3 L into the lungs.     . pravastatin (PRAVACHOL) 40 MG tablet TAKE 1 TABLET (40 MG TOTAL) BY MOUTH DAILY. 90 tablet 1  . CHANTIX CONTINUING MONTH PAK 1 MG tablet TAKE 1/2 TO 1 TABLET 2 X A DAY AS DIRECTED FOR SMOKING CESSATION 56 tablet 2   CURRENT MEDICATIONS: Scheduled Meds: . aspirin EC  81 mg Oral Daily  . azithromycin  250 mg Oral Daily  . cholecalciferol  1,000 Units Oral Daily  .  DULoxetine  60 mg Oral Daily  . enoxaparin (LOVENOX) injection  40 mg Subcutaneous Q24H  . famotidine  20 mg Oral BID  . furosemide  40 mg Intravenous Daily  . guaiFENesin  600 mg Oral Daily  . insulin aspart  0-5 Units Subcutaneous QHS  . insulin aspart  0-9 Units Subcutaneous TID WC  . ipratropium  0.5 mg Nebulization Q6H  . levalbuterol  1.25 mg Nebulization Q6H  . levothyroxine  175 mcg Oral QAC breakfast  . methylPREDNISolone (SOLU-MEDROL) injection  60 mg Intravenous TID  . metoprolol  5 mg Intravenous Q6H  . nicotine  14 mg Transdermal Daily  . pravastatin  40 mg Oral Daily   Continuous Infusions: . diltiazem (CARDIZEM) infusion 15 mg/hr (08/02/16 0239)   PRN Meds:.acetaminophen, acetaminophen-codeine, diazepam, ondansetron, zolpidem   Results for orders placed or performed during the hospital encounter of 07/31/16 (from the past 48 hour(s))  Basic metabolic panel     Status: Abnormal   Collection Time: 07/31/16  6:05 PM  Result Value Ref Range   Sodium 142 135 - 145 mmol/L   Potassium 4.3 3.5 - 5.1 mmol/L   Chloride 85 (L) 101 - 111 mmol/L   CO2 49 (H) 22 - 32 mmol/L   Glucose, Bld 108 (H) 65 - 99 mg/dL   BUN 9 6 - 20 mg/dL   Creatinine, Ser 0.62 0.44 - 1.00 mg/dL   Calcium 9.4 8.9 - 10.3 mg/dL   GFR calc non Af Amer >60 >60 mL/min   GFR calc Af Amer >60 >60 mL/min    Comment: (NOTE) The eGFR has been calculated using the CKD EPI equation. This calculation has not been validated in all clinical situations. eGFR's persistently <60 mL/min signify possible Chronic Kidney Disease.    Anion gap 8 5 - 15  CBC     Status: Abnormal   Collection Time: 07/31/16  6:05 PM  Result Value Ref Range   WBC 5.8 4.0 - 10.5 K/uL   RBC 4.38 3.87 - 5.11 MIL/uL   Hemoglobin 10.6 (L) 12.0 - 15.0 g/dL   HCT 40.5 36.0 - 46.0 %   MCV 92.5 78.0 - 100.0 fL   MCH 24.2 (L) 26.0 - 34.0 pg   MCHC 26.2 (L) 30.0 - 36.0 g/dL   RDW 15.5 11.5 - 15.5 %   Platelets 237 150 - 400 K/uL  Brain  natriuretic peptide     Status: Abnormal   Collection Time: 07/31/16  6:05 PM  Result Value Ref Range   B Natriuretic Peptide 448.9 (H) 0.0 - 100.0 pg/mL  I-stat troponin, ED     Status: None   Collection Time: 07/31/16  6:24 PM  Result Value Ref Range   Troponin i, poc 0.01 0.00 - 0.08 ng/mL   Comment 3            Comment: Due to the release kinetics of cTnI, a negative result within the first hours of the onset of symptoms does not rule out myocardial infarction with certainty. If myocardial infarction is still suspected, repeat the test at appropriate intervals.   I-Stat venous blood gas, ED     Status: Abnormal   Collection Time: 07/31/16  7:38 PM  Result Value Ref Range   pH, Ven 7.415 7.250 - 7.430   pCO2, Ven 84.2 (HH) 44.0 - 60.0 mmHg   pO2, Ven 17.0 (LL) 32.0 - 45.0 mmHg   Bicarbonate 53.9 (H) 20.0 - 28.0 mmol/L   TCO2 >50 0 - 100 mmol/L   O2 Saturation 21.0 %   Acid-Base Excess 24.0 (H) 0.0 - 2.0 mmol/L   Patient temperature HIDE    Sample type VENOUS    Comment NOTIFIED PHYSICIAN   HIV antibody     Status: None   Collection Time: 07/31/16  8:17 PM  Result Value Ref Range   HIV Screen 4th Generation wRfx Non Reactive Non Reactive  Comment: (NOTE) Performed At: [facility] [address] [place], [place] [id number] Lindon Romp MD [id number]   I-Stat Arterial Blood Gas, ED - (order at [facility] and [facility] only)     Status: Abnormal   Collection Time: 07/31/16  8:19 PM  Result Value Ref Range   pH, Arterial 7.369 7.350 - 7.450   pCO2 arterial 93.7 (HH) 32.0 - 48.0 mmHg   pO2, Arterial 63.0 (L) 83.0 - 108.0 mmHg   Bicarbonate 54.0 (H) 20.0 - 28.0 mmol/L   TCO2 >50 0 - 100 mmol/L   O2 Saturation 89.0 %   Acid-Base Excess 23.0 (H) 0.0 - 2.0 mmol/L   Patient temperature 98.6 F    Collection site RADIAL, ALLEN'S TEST ACCEPTABLE    Drawn by RT    Sample type ARTERIAL    Comment NOTIFIED PHYSICIAN   Troponin I (q 6hr x 3)     Status: None   Collection  Time: 07/31/16  9:16 PM  Result Value Ref Range   Troponin I <0.03 <0.03 ng/mL  Influenza panel by PCR (type A & B, H1N1)     Status: None   Collection Time: 07/31/16 10:25 PM  Result Value Ref Range   Influenza A By PCR NEGATIVE NEGATIVE   Influenza B By PCR NEGATIVE NEGATIVE    Comment: (NOTE) The Xpert Xpress Flu assay is intended as an aid in the diagnosis of  influenza and should not be used as a sole basis for treatment.  This  assay is FDA approved for nasopharyngeal swab specimens only. Nasal  washings and aspirates are unacceptable for Xpert Xpress Flu testing.   Respiratory Panel by PCR     Status: None   Collection Time: 07/31/16 10:25 PM  Result Value Ref Range   Adenovirus NOT DETECTED NOT DETECTED   Coronavirus 229E NOT DETECTED NOT DETECTED   Coronavirus HKU1 NOT DETECTED NOT DETECTED   Coronavirus NL63 NOT DETECTED NOT DETECTED   Coronavirus OC43 NOT DETECTED NOT DETECTED   Metapneumovirus NOT DETECTED NOT DETECTED   Rhinovirus / Enterovirus NOT DETECTED NOT DETECTED   Influenza A NOT DETECTED NOT DETECTED   Influenza B NOT DETECTED NOT DETECTED   Parainfluenza Virus 1 NOT DETECTED NOT DETECTED   Parainfluenza Virus 2 NOT DETECTED NOT DETECTED   Parainfluenza Virus 3 NOT DETECTED NOT DETECTED   Parainfluenza Virus 4 NOT DETECTED NOT DETECTED   Respiratory Syncytial Virus NOT DETECTED NOT DETECTED   Bordetella pertussis NOT DETECTED NOT DETECTED   Chlamydophila pneumoniae NOT DETECTED NOT DETECTED   Mycoplasma pneumoniae NOT DETECTED NOT DETECTED  CBG monitoring, ED     Status: Abnormal   Collection Time: 07/31/16 10:33 PM  Result Value Ref Range   Glucose-Capillary 136 (H) 65 - 99 mg/dL  Troponin I (q 6hr x 3)     Status: Abnormal   Collection Time: 08/01/16  2:57 AM  Result Value Ref Range   Troponin I 0.04 (HH) <0.03 ng/mL    Comment: CRITICAL RESULT CALLED TO, READ BACK BY AND VERIFIED WITH: TO JGLOSTER(RN) BY TCLEVELAND 08/01/2016 AT 5:38AM   TSH      Status: None   Collection Time: 08/01/16  4:38 AM  Result Value Ref Range   TSH 1.995 0.350 - 4.500 uIU/mL    Comment: Performed by a 3rd Generation assay with a functional sensitivity of <=0.01 uIU/mL.  MRSA PCR Screening     Status: None   Collection Time: 08/01/16  7:05 AM  Result Value Ref  Range   MRSA by PCR NEGATIVE NEGATIVE    Comment:        The GeneXpert MRSA Assay (FDA approved for NASAL specimens only), is one component of a comprehensive MRSA colonization surveillance program. It is not intended to diagnose MRSA infection nor to guide or monitor treatment for MRSA infections.   Strep pneumoniae urinary antigen     Status: None   Collection Time: 08/01/16  7:06 AM  Result Value Ref Range   Strep Pneumo Urinary Antigen NEGATIVE NEGATIVE    Comment:        Infection due to S. pneumoniae cannot be absolutely ruled out since the antigen present may be below the detection limit of the test.   Glucose, capillary     Status: Abnormal   Collection Time: 08/01/16  8:00 AM  Result Value Ref Range   Glucose-Capillary 140 (H) 65 - 99 mg/dL  Troponin I (q 6hr x 3)     Status: Abnormal   Collection Time: 08/01/16  8:58 AM  Result Value Ref Range   Troponin I 0.05 (HH) <0.03 ng/mL    Comment: CRITICAL VALUE NOTED.  VALUE IS CONSISTENT WITH PREVIOUSLY REPORTED AND CALLED VALUE.  Glucose, capillary     Status: Abnormal   Collection Time: 08/01/16 12:20 PM  Result Value Ref Range   Glucose-Capillary 102 (H) 65 - 99 mg/dL   Comment 1 Document in Chart   Blood gas, arterial     Status: Abnormal   Collection Time: 08/01/16  3:00 PM  Result Value Ref Range   FIO2 80.00    Delivery systems BILEVEL POSITIVE AIRWAY PRESSURE    Inspiratory PAP 14    Expiratory PAP 6    pH, Arterial 7.458 (H) 7.350 - 7.450   pCO2 arterial 73.1 (HH) 32.0 - 48.0 mmHg    Comment: CRITICAL RESULT CALLED TO, READ BACK BY AND VERIFIED WITH: TATA CARBONE RN, AT 1516 BY SHERRY BREWER RRT, RCP ON  08/01/16    pO2, Arterial 47.5 (L) 83.0 - 108.0 mmHg   Bicarbonate 51.3 (H) 20.0 - 28.0 mmol/L   Acid-Base Excess 25.1 (H) 0.0 - 2.0 mmol/L   O2 Saturation 82.3 %   Patient temperature 97.7    Collection site RIGHT RADIAL    Drawn by [id number]    Sample type ARTERIAL DRAW    Allens test (pass/fail) PASS PASS  Basic metabolic panel     Status: Abnormal   Collection Time: 08/01/16  3:44 PM  Result Value Ref Range   Sodium 141 135 - 145 mmol/L   Potassium 4.6 3.5 - 5.1 mmol/L   Chloride 85 (L) 101 - 111 mmol/L   CO2 40 (H) 22 - 32 mmol/L   Glucose, Bld 94 65 - 99 mg/dL   BUN 17 6 - 20 mg/dL   Creatinine, Ser 0.66 0.44 - 1.00 mg/dL   Calcium 9.5 8.9 - 10.3 mg/dL   GFR calc non Af Amer >60 >60 mL/min   GFR calc Af Amer >60 >60 mL/min    Comment: (NOTE) The eGFR has been calculated using the CKD EPI equation. This calculation has not been validated in all clinical situations. eGFR's persistently <60 mL/min signify possible Chronic Kidney Disease.    Anion gap 16 (H) 5 - 15  Magnesium     Status: Abnormal   Collection Time: 08/01/16  3:44 PM  Result Value Ref Range   Magnesium 1.6 (L) 1.7 - 2.4 mg/dL  Glucose, capillary     Status:  Abnormal   Collection Time: 08/01/16  5:44 PM  Result Value Ref Range   Glucose-Capillary 105 (H) 65 - 99 mg/dL  Glucose, capillary     Status: Abnormal   Collection Time: 08/01/16  9:45 PM  Result Value Ref Range   Glucose-Capillary 148 (H) 65 - 99 mg/dL  Basic metabolic panel     Status: Abnormal   Collection Time: 08/02/16  2:38 AM  Result Value Ref Range   Sodium 140 135 - 145 mmol/L   Potassium 4.4 3.5 - 5.1 mmol/L   Chloride 85 (L) 101 - 111 mmol/L   CO2 43 (H) 22 - 32 mmol/L   Glucose, Bld 157 (H) 65 - 99 mg/dL   BUN 18 6 - 20 mg/dL   Creatinine, Ser 0.03 0.44 - 1.00 mg/dL   Calcium 9.3 8.9 - 49.6 mg/dL   GFR calc non Af Amer >60 >60 mL/min   GFR calc Af Amer >60 >60 mL/min    Comment: (NOTE) The eGFR has been calculated using the CKD  EPI equation. This calculation has not been validated in all clinical situations. eGFR's persistently <60 mL/min signify possible Chronic Kidney Disease.    Anion gap 12 5 - 15  Magnesium     Status: None   Collection Time: 08/02/16  2:38 AM  Result Value Ref Range   Magnesium 1.9 1.7 - 2.4 mg/dL  Blood gas, arterial     Status: Abnormal   Collection Time: 08/02/16  3:43 AM  Result Value Ref Range   FIO2 100.00    Delivery systems BILEVEL POSITIVE AIRWAY PRESSURE    Inspiratory PAP 16    Expiratory PAP 8.0    pH, Arterial 7.381 7.350 - 7.450    Comment: CRITICAL RESULT CALLED TO, READ BACK BY AND VERIFIED WITH: Gurney Maxin, RRT AT 0355 BY FRANCIS ARMOUR RRT,RCP ON11/17/2017    pCO2 arterial 83.8 (HH) 32.0 - 48.0 mmHg    Comment: CRITICAL RESULT CALLED TO, READ BACK BY AND VERIFIED WITH: MAURICE SMITH,RRT AT 0355 BY FRANCIS ARMOUR RRT,RCP ON 08/02/2016    pO2, Arterial 73.9 (L) 83.0 - 108.0 mmHg   Bicarbonate 48.6 (H) 20.0 - 28.0 mmol/L   Acid-Base Excess 22.0 (H) 0.0 - 2.0 mmol/L   O2 Saturation 92.7 %   Patient temperature 98.6    Collection site RIGHT RADIAL    Drawn by [id number]    Sample type ARTERIAL DRAW    Allens test (pass/fail) PASS PASS  Glucose, capillary     Status: Abnormal   Collection Time: 08/02/16  8:47 AM  Result Value Ref Range   Glucose-Capillary 152 (H) 65 - 99 mg/dL  Glucose, capillary     Status: Abnormal   Collection Time: 08/02/16 12:14 PM  Result Value Ref Range   Glucose-Capillary 186 (H) 65 - 99 mg/dL   Dg Chest 2 View  Result Date: 07/31/2016 CLINICAL DATA:  Cough and dyspnea for 4 days. EXAM: CHEST  2 VIEW COMPARISON:  09/27/2014 chest radiograph FINDINGS: The heart is top-normal in size. There is aortic atherosclerosis. Mild diffuse interstitial prominence consistent with interstitial edema is noted. There are small bilateral pleural effusions posteriorly blunting the costophrenic angles right greater than left. There is bibasilar  atelectasis. No suspicious osseous lesions. IMPRESSION: Interstitial pulmonary edema with small pleural effusions and borderline cardiomegaly. Aortic atherosclerosis. Electronically Signed   By: Tollie Eth M.D.   On: 07/31/2016 17:50    ECHO 08/02/16 Study Conclusions  - Left ventricle: The cavity size was  moderately reduced. Wall   thickness was increased in a pattern of moderate LVH. The   estimated ejection fraction was in the range of 65% to 70%. Wall   motion was normal; there were no regional wall motion   abnormalities. - Aortic valve: Severely calcified annulus. Moderately thickened,   moderately calcified leaflets. There was mild stenosis. Valve   area (VTI): 1.05 cm^2. Valve area (Vmax): 1.09 cm^2. Valve area   (Vmean): 1.05 cm^2. - Mitral valve: Moderately calcified annulus. - Left atrium: The atrium was severely dilated. - Right atrium: The atrium was mildly dilated. - Pulmonary arteries: Systolic pressure was severely increased. PA   peak pressure: 70 mm Hg (S).    ROS: General:+ colds or fevers, + weight increase Skin:no rashes or ulcers HEENT:no blurred vision, no congestion CV:see HPI PUL:see HPI GI:no diarrhea constipation or melena, no indigestion GU:no hematuria, no dysuria MS:no joint pain, no claudication Neuro:no syncope, + lightheadedness on occ just prior to admit Endo:+ diabetes, + thyroid disease  Blood pressure 117/72, pulse 73, temperature 98.7 F (37.1 C), temperature source Oral, resp. rate 12, height '5\' 4"'$  (1.626 m), weight 232 lb (105.2 kg), SpO2 92 %.  Wt Readings from Last 3 Encounters:  08/02/16 232 lb (105.2 kg)  07/31/16 254 lb 3.2 oz (115.3 kg)  06/06/16 246 lb (111.6 kg)    PE: General:Pleasant affect, NAD Skin:Warm and dry, brisk capillary refill HEENT:normocephalic, sclera clear, mucus membranes moist Neck:supple, no JVD, no bruits  Heart:irreg irreg without murmur, gallup, rub or click Lungs:diminished in bases, without  rales, rhonchi, or wheezes FOY:DXAJ, non tender, + BS, do not palpate liver spleen or masses Ext:no lower ext edema, 2+ pedal pulses, 2+ radial pulses Neuro:alert and oriented X 3, MAE, follows commands, + facial symmetry    Assessment/Plan Principal Problem:   Acute on chronic respiratory failure with hypoxia (HCC) Active Problems:   Essential hypertension   Hyperlipidemia   Hypothyroidism   Paroxysmal SVT (supraventricular tachycardia) (HCC)   Acute on chronic diastolic CHF (congestive heart failure) (HCC)   GERD (gastroesophageal reflux disease)   COPD with acute exacerbation (HCC)   Atrial fibrillation with RVR ([place])  1. Atrial flutter with RVR rate improved.  Needs anticoagulation with CHA2DS2VASc of 4.  On IV dilt - previously on Cardizem 60 mg BID, we could increase to TID and stop IV dilt.  With her COPD ? BB long term, she is on currently 5 mg every 6 hours lopressor.  - ? Rate control then outpt DCCV in several weeks once lungs have improved -add Eliquis 5 mg BID and stop ASA  Dr. Angelena Form to see and evaluate.    2. No hx of CAD and neg nuc for ischemia in the past.  No chest pain stable EF on Echo. troponins <0.03; 0.04 and 0.05 due to rapid heart rate.   3. OSA does not use CPAP but now needs BiPAP per Im and to see Dr. Halford Chessman  4. Tobacco use, she has tried to stop in the past, very difficult Chantix has made her sick twice.  Now on nicoderm, her son states she will stop.  5. Hyperlipidemia on pravachol  6. DM-2 per Margot Chimes  Nurse Practitioner Certified [place] Pager [phone] or after 5pm or weekends call [phone] 08/02/2016, 1:29 PM   I have personally seen and examined this patient with Cecilie Kicks, NP. I agree with the assessment and plan as outlined above. She has history of SVT.  Now admitted with COPD exacerbation and with onset of atrial flutter during hospitalization. Exam shows  WDWN female in NAD. JZ:PHXTAVW,PVXYI,  systolic murmur. Lungs: clear bilaterally. Abd: soft, NT. Ext: no edema.  Labs reviewed.  1. Atrial flutter, new onset: Would change IV Cardizem (15 mg/hour)  to Dilitiazem 90 mg po Q6 hours and would change IV Lopressor to Lopressor 25 mg po BID. If she tolerates, would change to long acting Cardizem tomorrow. If any worsening of wheezing, may have to stop beta blocker. Would start Eliquis based on her stroke risk with atrial flutter.   Pt and son agree with plan.   Lauree Chandler 08/02/2016 4:19 PM

## 2016-08-02 NOTE — Progress Notes (Signed)
Have notified on-call of patient's ABG result

## 2016-08-03 ENCOUNTER — Other Ambulatory Visit: Payer: Self-pay | Admitting: Internal Medicine

## 2016-08-03 DIAGNOSIS — J439 Emphysema, unspecified: Secondary | ICD-10-CM

## 2016-08-03 DIAGNOSIS — I5033 Acute on chronic diastolic (congestive) heart failure: Secondary | ICD-10-CM | POA: Diagnosis not present

## 2016-08-03 DIAGNOSIS — J9621 Acute and chronic respiratory failure with hypoxia: Secondary | ICD-10-CM | POA: Diagnosis not present

## 2016-08-03 DIAGNOSIS — I4891 Unspecified atrial fibrillation: Secondary | ICD-10-CM | POA: Diagnosis not present

## 2016-08-03 DIAGNOSIS — J441 Chronic obstructive pulmonary disease with (acute) exacerbation: Secondary | ICD-10-CM | POA: Diagnosis not present

## 2016-08-03 LAB — BASIC METABOLIC PANEL
Anion gap: 14 (ref 5–15)
BUN: 28 mg/dL — AB (ref 6–20)
CHLORIDE: 85 mmol/L — AB (ref 101–111)
CO2: 41 mmol/L — AB (ref 22–32)
Calcium: 9.3 mg/dL (ref 8.9–10.3)
Creatinine, Ser: 0.71 mg/dL (ref 0.44–1.00)
GFR calc Af Amer: >60 mL/min (ref >60)
GLUCOSE: 160 mg/dL — AB (ref 65–99)
POTASSIUM: 3.9 mmol/L (ref 3.5–5.1)
Sodium: 140 mmol/L (ref 135–145)

## 2016-08-03 LAB — CBC
HEMATOCRIT: 37.2 % (ref 36.0–46.0)
HEMOGLOBIN: 10.5 g/dL — AB (ref 12.0–15.0)
MCH: 24.2 pg — AB (ref 26.0–34.0)
MCHC: 28.2 g/dL — AB (ref 30.0–36.0)
MCV: 85.7 fL (ref 78.0–100.0)
Platelets: 304 10*3/uL (ref 150–400)
RBC: 4.34 MIL/uL (ref 3.87–5.11)
RDW: 16.2 % — ABNORMAL HIGH (ref 11.5–15.5)
WBC: 6.7 10*3/uL (ref 4.0–10.5)

## 2016-08-03 LAB — GLUCOSE, CAPILLARY
GLUCOSE-CAPILLARY: 276 mg/dL — AB (ref 65–99)
Glucose-Capillary: 168 mg/dL — ABNORMAL HIGH (ref 65–99)
Glucose-Capillary: 218 mg/dL — ABNORMAL HIGH (ref 65–99)
Glucose-Capillary: 168 mg/dL — ABNORMAL HIGH (ref 65–99)

## 2016-08-03 LAB — MAGNESIUM: Magnesium: 2.1 mg/dL (ref 1.7–2.4)

## 2016-08-03 MED ORDER — METHYLPREDNISOLONE SODIUM SUCC 125 MG IJ SOLR
60.0000 mg | Freq: Two times a day (BID) | INTRAMUSCULAR | Status: AC
  Administered 2016-08-03 – 2016-08-05 (×5): 60 mg via INTRAVENOUS
  Filled 2016-08-03 (×5): qty 2

## 2016-08-03 NOTE — Progress Notes (Signed)
Pt is on HFNC sitting in the chair at this time. RN at bedside.

## 2016-08-03 NOTE — Plan of Care (Signed)
Problem: Pain Managment: Goal: General experience of comfort will improve Outcome: Progressing Patient is complaining of headache with removal of the Bipap. Patient received water and mouth care.  Patient was given teach back with return demonstration of deep breathing displayed.

## 2016-08-03 NOTE — Plan of Care (Signed)
Problem: Safety: Goal: Ability to remain free from injury will improve Outcome: Progressing Discussed importance of pulse ox readings with some teach back displayed

## 2016-08-03 NOTE — Progress Notes (Signed)
Progress Note    Jillian Wood  [id number] DOB: [birth year]  DOA: 07/31/2016 PCP: Alesia Richards, MD    Brief Narrative:   Chief complaint: Follow-up COPD exacerbation  Jillian Wood is an 70 y.o. 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  Assessment/Plan:   Principal Problem:   Acute on chronic respiratory failure with hypoxia (HCC)Secondary to COPD with acute exacerbation in the setting of ongoing tobacco abuse Respiratory failure multifactorial including COPD exacerbation in setting of tobacco abuse, OSA noncompliant with CPAP and possible CHF in the setting of atrial flutter. Continue nebulizer treatments, Solu-Medrol, flutter valve, BiPAP at night per respiratory, and high flow oxygen to maintain oxygen saturations. Continue azithromycin for 5 day course of therapy. Counseled regarding tobacco cessation and placed on a nicotine patch. Wean steroids as tolerated. Continue Mucinex. Respiratory virus panel negative.  Active Problems:   Atrial fibrillation/flutter/paroxysmal SVT/atrial fibrillation with RVR/demand ischemia Rate controlled on oral Cardizem/metoprolol. Eliquis started given CHA2DS2VASc of 4. Mild troponin elevation consistent with demand ischemia noted.    Essential hypertension Currently controlled on Cardizem and Lopressor.    Hyperlipidemia Continue Pravachol.    Hypothyroidism Continue Synthroid. TSH 1.995.    Acute on chronic diastolic CHF (congestive heart failure) ([place]) Placed on IV diuretics with Lasix 40 mg daily. 2-D echo showed EF 65-70 percent with no regional wall motion abnormalities. Heart failure symptoms likely triggered by rapid A.  fib/flutter. Rate control should improve symptoms.    GERD (gastroesophageal reflux disease) Continue Pepcid.    Type 2 diabetes in obese Currently being managed with insulin sensitive SSI. CBGs 161-189. Managed with metformin at home. Diabetes well controlled with last hemoglobin A1c 5.2% on 06/06/16.   Family Communication/Anticipated D/C date and plan/Code Status   DVT prophylaxis: Eliquis ordered. Code Status: Full Code.  Family Communication: No family at the bedside. Disposition Plan: Home in 48-72 hours if stable.   Medical Consultants:    Cardiology   Procedures:    2-D echo 08/02/16: EF 65-70 percent, no regional wall motion abnormalities, aortic valve stenosis.  Anti-Infectives:    Azithromycin 07/31/16--->  Subjective:   The patient reports that she doesn't feel well today.  Didn't sleep well last night, and now has a headache. Review of symptoms is positive for shortness of breath, non-productive cough and negative for chest pain, nausea or vomiting.  Objective:    Vitals:   08/03/16 0600 08/03/16 0611 08/03/16 0727 08/03/16 0851  BP: 121/67  (!) 143/85   Pulse: 99  75   Resp: 16  16   Temp:      TempSrc:      SpO2: (!) 85% 91% (!) 89% (!) 88%  Weight:      Height:        Intake/Output Summary (Last 24 hours) at 08/03/16 0915 Last data filed at 08/03/16 0600  Gross per 24 hour  Intake            157.5 ml  Output             1350 ml  Net          -1192.5 ml   Filed Weights   08/01/16 1315 08/02/16 0405 08/03/16 0226  Weight: 115.2 kg (254 lb) 105.2 kg (232 lb) 112 kg (247 lb)   Tele: Atrial flutter.  Exam: General exam: Appears calm and comfortable.  Respiratory system: Rales right base. Respiratory effort increased. High flow oxygen via nasal cannula in place. Cardiovascular system: HSIR.  No JVD,  rubs, gallops or clicks. No murmurs. Gastrointestinal system: Abdomen is nondistended, soft and nontender. No organomegaly or masses felt.  Normal bowel sounds heard. Central nervous system: Alert and oriented. No focal neurological deficits. Extremities: No clubbing,  or cyanosis. Trace edema. Skin: No rashes, lesions or ulcers. Psychiatry: Judgement and insight appear normal. Mood & affect appropriate.   Data Reviewed:   I have personally reviewed following labs and imaging studies:  Labs: Basic Metabolic Panel:  Recent Labs Lab 07/31/16 1805 08/01/16 1544 08/02/16 0238 08/03/16 0220  NA 142 141 140 140  K 4.3 4.6 4.4 3.9  CL 85* 85* 85* 85*  CO2 49* 40* 43* 41*  GLUCOSE 108* 94 157* 160*  BUN 9 17 18  28*  CREATININE 0.62 0.66 0.66 0.71  CALCIUM 9.4 9.5 9.3 9.3  MG  --  1.6* 1.9 2.1   GFR Estimated Creatinine Clearance: 80.2 mL/min (by C-G formula based on SCr of 0.71 mg/dL).  CBC:  Recent Labs Lab 07/31/16 1805 08/03/16 0220  WBC 5.8 6.7  HGB 10.6* 10.5*  HCT 40.5 37.2  MCV 92.5 85.7  PLT 237 304   Cardiac Enzymes:  Recent Labs Lab 07/31/16 2116 08/01/16 0257 08/01/16 0858  TROPONINI <0.03 0.04* 0.05*   CBG:  Recent Labs Lab 08/02/16 1214 08/02/16 1630 08/02/16 2140 08/02/16 2142 08/03/16 0750  GLUCAP 186* 161* 177* 189* 168*   Thyroid function studies:  Recent Labs  08/01/16 0438  TSH 1.995    Microbiology Recent Results (from the past 240 hour(s))  Respiratory Panel by PCR     Status: None   Collection Time: 07/31/16 10:25 PM  Result Value Ref Range Status   Adenovirus NOT DETECTED NOT DETECTED Final   Coronavirus 229E NOT DETECTED NOT DETECTED Final   Coronavirus HKU1 NOT DETECTED NOT DETECTED Final   Coronavirus NL63 NOT DETECTED NOT DETECTED Final   Coronavirus OC43 NOT DETECTED NOT DETECTED Final   Metapneumovirus NOT DETECTED NOT DETECTED Final   Rhinovirus / Enterovirus NOT DETECTED NOT DETECTED Final   Influenza A NOT DETECTED NOT DETECTED Final   Influenza B NOT DETECTED NOT DETECTED Final   Parainfluenza Virus 1 NOT DETECTED NOT DETECTED Final    Parainfluenza Virus 2 NOT DETECTED NOT DETECTED Final   Parainfluenza Virus 3 NOT DETECTED NOT DETECTED Final   Parainfluenza Virus 4 NOT DETECTED NOT DETECTED Final   Respiratory Syncytial Virus NOT DETECTED NOT DETECTED Final   Bordetella pertussis NOT DETECTED NOT DETECTED Final   Chlamydophila pneumoniae NOT DETECTED NOT DETECTED Final   Mycoplasma pneumoniae NOT DETECTED NOT DETECTED Final  MRSA PCR Screening     Status: None   Collection Time: 08/01/16  7:05 AM  Result Value Ref Range Status   MRSA by PCR NEGATIVE NEGATIVE Final    Comment:        The GeneXpert MRSA Assay (FDA approved for NASAL specimens only), is one component of a comprehensive MRSA colonization surveillance program. It is not intended to diagnose MRSA infection nor to guide or monitor treatment for MRSA infections.     Radiology: No results found.  Medications:   . apixaban  5 mg Oral BID  . azithromycin  250 mg Oral Daily  .  cholecalciferol  1,000 Units Oral Daily  . diltiazem  90 mg Oral Q6H  . DULoxetine  60 mg Oral Daily  . famotidine  20 mg Oral BID  . furosemide  40 mg Intravenous Daily  . guaiFENesin  600 mg Oral Daily  . insulin aspart  0-5 Units Subcutaneous QHS  . insulin aspart  0-9 Units Subcutaneous TID WC  . ipratropium  0.5 mg Nebulization Q6H  . levalbuterol  1.25 mg Nebulization Q6H  . levothyroxine  175 mcg Oral QAC breakfast  . methylPREDNISolone (SOLU-MEDROL) injection  60 mg Intravenous TID  . metoprolol tartrate  25 mg Oral BID  . nicotine  14 mg Transdermal Daily  . pravastatin  40 mg Oral Daily   Continuous Infusions: . diltiazem (CARDIZEM) infusion Stopped (08/02/16 1930)    Medical decision making is of high complexity and this patient is at high risk of deterioration, therefore this is a level 3 visit.    LOS: 3 days   [place] [facility] Pager [phone]. If unable to reach me by pager, please call my cell phone at [phone].  *Please refer  to [email or web address], password [email or web address] to get updated schedule on who will round on this patient, as hospitalists switch teams weekly. If 7PM-7AM, please contact night-coverage at [email or web address], password [email or web address] for any overnight needs.  08/03/2016, 9:15 AM

## 2016-08-04 DIAGNOSIS — J9621 Acute and chronic respiratory failure with hypoxia: Secondary | ICD-10-CM | POA: Diagnosis not present

## 2016-08-04 DIAGNOSIS — I5033 Acute on chronic diastolic (congestive) heart failure: Secondary | ICD-10-CM | POA: Diagnosis not present

## 2016-08-04 DIAGNOSIS — J441 Chronic obstructive pulmonary disease with (acute) exacerbation: Secondary | ICD-10-CM | POA: Diagnosis not present

## 2016-08-04 DIAGNOSIS — I4891 Unspecified atrial fibrillation: Secondary | ICD-10-CM | POA: Diagnosis not present

## 2016-08-04 LAB — GLUCOSE, CAPILLARY
GLUCOSE-CAPILLARY: 195 mg/dL — AB (ref 65–99)
Glucose-Capillary: 248 mg/dL — ABNORMAL HIGH (ref 65–99)
Glucose-Capillary: 274 mg/dL — ABNORMAL HIGH (ref 65–99)
Glucose-Capillary: 203 mg/dL — ABNORMAL HIGH (ref 65–99)

## 2016-08-04 LAB — BASIC METABOLIC PANEL
ANION GAP: 12 (ref 5–15)
BUN: 37 mg/dL — ABNORMAL HIGH (ref 6–20)
CHLORIDE: 81 mmol/L — AB (ref 101–111)
CO2: 45 mmol/L — AB (ref 22–32)
Calcium: 9.5 mg/dL (ref 8.9–10.3)
Creatinine, Ser: 1.02 mg/dL — ABNORMAL HIGH (ref 0.44–1.00)
GFR calc Af Amer: >60 mL/min (ref >60)
GFR calc non Af Amer: 54 mL/min — ABNORMAL LOW (ref >60)
GLUCOSE: 167 mg/dL — AB (ref 65–99)
POTASSIUM: 3.4 mmol/L — AB (ref 3.5–5.1)
Sodium: 138 mmol/L (ref 135–145)

## 2016-08-04 LAB — MAGNESIUM: Magnesium: 2.1 mg/dL (ref 1.7–2.4)

## 2016-08-04 MED ORDER — POTASSIUM CHLORIDE CRYS ER 20 MEQ PO TBCR
40.0000 meq | EXTENDED_RELEASE_TABLET | Freq: Once | ORAL | Status: AC
  Administered 2016-08-04: 40 meq via ORAL
  Filled 2016-08-04: qty 2

## 2016-08-04 MED ORDER — POLYETHYLENE GLYCOL 3350 17 G PO PACK
17.0000 g | PACK | Freq: Every day | ORAL | Status: DC
  Administered 2016-08-04 – 2016-08-11 (×6): 17 g via ORAL
  Filled 2016-08-04 (×8): qty 1

## 2016-08-04 MED ORDER — SODIUM CHLORIDE 0.9% FLUSH
3.0000 mL | Freq: Two times a day (BID) | INTRAVENOUS | Status: DC
  Administered 2016-08-04 – 2016-08-10 (×12): 3 mL via INTRAVENOUS

## 2016-08-04 NOTE — Progress Notes (Addendum)
Progress Note    Jillian Wood  [id number] DOB: 1946/05/27  DOA: 07/31/2016 PCP: Alesia Richards, MD    Brief Narrative:   Chief complaint: Follow-up COPD exacerbation  Jillian Wood is an 70 y.o. 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  Assessment/Plan:   Principal Problem:   Acute on chronic respiratory failure with hypoxia (HCC)Secondary to COPD with acute exacerbation in the setting of ongoing tobacco abuse Respiratory failure multifactorial including COPD exacerbation in setting of tobacco abuse, OSA noncompliant with CPAP and possible CHF in the setting of atrial flutter. Continue nebulizer treatments, Solu-Medrol, flutter valve, BiPAP at night per respiratory, and high flow oxygen to maintain oxygen saturations. Continue azithromycin for 5 day course of therapy. Counseled regarding tobacco cessation and placed on a nicotine patch. Wean steroids as tolerated (weaned to 60 mg Solumedrol Q 12 hours on 08/03/16). Continue Mucinex. Respiratory virus panel negative. Still requiring high flow oxygen to maintain oxygen saturations.  Keep in SDU.  Active Problems:   Atrial fibrillation/flutter/paroxysmal SVT/atrial fibrillation with RVR/demand ischemia Rate controlled on oral Cardizem/metoprolol. Eliquis started given CHA2DS2VASc of 4. Mild troponin elevation consistent with demand ischemia noted.    Essential hypertension Currently controlled on Cardizem and Lopressor.    Hyperlipidemia Continue Pravachol.    Hypothyroidism Continue Synthroid. TSH 1.995.    Acute on chronic diastolic CHF (congestive heart failure) ([place]) Placed on IV diuretics with Lasix 40 mg daily on  admission. 2-D echo showed EF 65-70 percent with no regional wall motion abnormalities. Heart failure symptoms likely triggered by rapid A. fib/flutter. Will stop Lasix today given bump in creatinine.    GERD (gastroesophageal reflux disease) Continue Pepcid.    Type 2 diabetes in obese Currently being managed with insulin sensitive SSI. CBGs 161-189. Managed with metformin at home. Diabetes well controlled with last hemoglobin A1c 5.2% on 06/06/16.    Hypokalemia Replete.   Family Communication/Anticipated D/C date and plan/Code Status   DVT prophylaxis: Eliquis ordered. Code Status: Full Code.  Family Communication: No family at the bedside. Disposition Plan: Home in 48-72 hours if stable.   Medical Consultants:    Cardiology   Procedures:    2-D echo 08/02/16: EF 65-70 percent, no regional wall motion abnormalities, aortic valve stenosis.  Anti-Infectives:    Azithromycin 07/31/16--->  Subjective:   The patient reports that she feels better today.  Has slept better and her headache is gone.  Review of symptoms is positive for shortness of breath, dizziness when standing/ambulating and negative for chest pain, nausea or vomiting.  Objective:    Vitals:   08/04/16 0520 08/04/16 0600 08/04/16 0700 08/04/16 0709  BP: 120/87   138/88  Pulse:  74 74 75  Resp:  18 16 16   Temp:   97.8 F (36.6 C)   TempSrc:   Oral   SpO2:  92% 91% 91%  Weight:      Height:        Intake/Output Summary (Last 24 hours) at 08/04/16 0807 Last data filed at 08/04/16 0500  Gross per 24 hour  Intake             1610 ml  Output  1500 ml  Net              110 ml   Filed Weights   08/02/16 0405 08/03/16 0226 08/04/16 0320  Weight: 105.2 kg (232 lb) 112 kg (247 lb) 109.8 kg (242 lb)   Tele: Atrial flutter in 70's.  Exam: General exam: Asleep in chair, awakens to voice.  Respiratory system: Decreased breath sounds throughout with poor air movement. Respiratory effort  increased. High flow oxygen via nasal cannula in place. Cardiovascular system: HSIR.  No JVD,  rubs, gallops or clicks. No murmurs. Gastrointestinal system: Abdomen is nondistended, soft and nontender. No organomegaly or masses felt. Normal bowel sounds heard. Central nervous system: Sleepy but oriented when awakened. No focal neurological deficits. Extremities: No clubbing,  or cyanosis. Trace edema. Skin: No rashes, lesions or ulcers. Psychiatry: Judgement and insight appear normal. Mood & affect appropriate.   Data Reviewed:   I have personally reviewed following labs and imaging studies:  Labs: Basic Metabolic Panel:  Recent Labs Lab 07/31/16 1805 08/01/16 1544 08/02/16 0238 08/03/16 0220 08/04/16 0526  NA 142 141 140 140 138  K 4.3 4.6 4.4 3.9 3.4*  CL 85* 85* 85* 85* 81*  CO2 49* 40* 43* 41* 45*  GLUCOSE 108* 94 157* 160* 167*  BUN 9 17 18  28* 37*  CREATININE 0.62 0.66 0.66 0.71 1.02*  CALCIUM 9.4 9.5 9.3 9.3 9.5  MG  --  1.6* 1.9 2.1 2.1   GFR Estimated Creatinine Clearance: 62.1 mL/min (by C-G formula based on SCr of 1.02 mg/dL (H)).  CBC:  Recent Labs Lab 07/31/16 1805 08/03/16 0220  WBC 5.8 6.7  HGB 10.6* 10.5*  HCT 40.5 37.2  MCV 92.5 85.7  PLT 237 304   Cardiac Enzymes:  Recent Labs Lab 07/31/16 2116 08/01/16 0257 08/01/16 0858  TROPONINI <0.03 0.04* 0.05*   CBG:  Recent Labs Lab 08/02/16 2142 08/03/16 0750 08/03/16 1224 08/03/16 1721 08/03/16 2120  GLUCAP 189* 168* 218* 168* 276*    Microbiology Recent Results (from the past 240 hour(s))  Respiratory Panel by PCR     Status: None   Collection Time: 07/31/16 10:25 PM  Result Value Ref Range Status   Adenovirus NOT DETECTED NOT DETECTED Final   Coronavirus 229E NOT DETECTED NOT DETECTED Final   Coronavirus HKU1 NOT DETECTED NOT DETECTED Final   Coronavirus NL63 NOT DETECTED NOT DETECTED Final   Coronavirus OC43 NOT DETECTED NOT DETECTED Final   Metapneumovirus NOT DETECTED NOT  DETECTED Final   Rhinovirus / Enterovirus NOT DETECTED NOT DETECTED Final   Influenza A NOT DETECTED NOT DETECTED Final   Influenza B NOT DETECTED NOT DETECTED Final   Parainfluenza Virus 1 NOT DETECTED NOT DETECTED Final   Parainfluenza Virus 2 NOT DETECTED NOT DETECTED Final   Parainfluenza Virus 3 NOT DETECTED NOT DETECTED Final   Parainfluenza Virus 4 NOT DETECTED NOT DETECTED Final   Respiratory Syncytial Virus NOT DETECTED NOT DETECTED Final   Bordetella pertussis NOT DETECTED NOT DETECTED Final   Chlamydophila pneumoniae NOT DETECTED NOT DETECTED Final   Mycoplasma pneumoniae NOT DETECTED NOT DETECTED Final  MRSA PCR Screening     Status: None   Collection Time: 08/01/16  7:05 AM  Result Value Ref Range Status   MRSA by PCR NEGATIVE NEGATIVE Final    Comment:        The GeneXpert MRSA Assay (FDA approved for NASAL specimens only), is one component of a comprehensive MRSA colonization surveillance program. It is not  intended to diagnose MRSA infection nor to guide or monitor treatment for MRSA infections.     Radiology: No results found.  Medications:   . apixaban  5 mg Oral BID  . azithromycin  250 mg Oral Daily  . cholecalciferol  1,000 Units Oral Daily  . diltiazem  90 mg Oral Q6H  . DULoxetine  60 mg Oral Daily  . famotidine  20 mg Oral BID  . furosemide  40 mg Intravenous Daily  . guaiFENesin  600 mg Oral Daily  . insulin aspart  0-5 Units Subcutaneous QHS  . insulin aspart  0-9 Units Subcutaneous TID WC  . ipratropium  0.5 mg Nebulization Q6H  . levalbuterol  1.25 mg Nebulization Q6H  . levothyroxine  175 mcg Oral QAC breakfast  . methylPREDNISolone (SOLU-MEDROL) injection  60 mg Intravenous BID  . metoprolol tartrate  25 mg Oral BID  . nicotine  14 mg Transdermal Daily  . pravastatin  40 mg Oral Daily   Continuous Infusions: . diltiazem (CARDIZEM) infusion Stopped (08/02/16 1930)    Medical decision making is of high complexity and this patient is  at high risk of deterioration, therefore this is a level 3 visit.    LOS: 4 days   [place] [facility] Pager [phone]. If unable to reach me by pager, please call my cell phone at [phone].  *Please refer to [email or web address], password [email or web address] to get updated schedule on who will round on this patient, as hospitalists switch teams weekly. If 7PM-7AM, please contact night-coverage at [email or web address], password [email or web address] for any overnight needs.  08/04/2016, 8:07 AM

## 2016-08-04 NOTE — Discharge Instructions (Signed)

## 2016-08-05 DIAGNOSIS — I4891 Unspecified atrial fibrillation: Secondary | ICD-10-CM | POA: Diagnosis not present

## 2016-08-05 DIAGNOSIS — I10 Essential (primary) hypertension: Secondary | ICD-10-CM

## 2016-08-05 DIAGNOSIS — I5033 Acute on chronic diastolic (congestive) heart failure: Secondary | ICD-10-CM | POA: Diagnosis not present

## 2016-08-05 DIAGNOSIS — J441 Chronic obstructive pulmonary disease with (acute) exacerbation: Secondary | ICD-10-CM | POA: Diagnosis not present

## 2016-08-05 DIAGNOSIS — I471 Supraventricular tachycardia: Secondary | ICD-10-CM

## 2016-08-05 DIAGNOSIS — J9621 Acute and chronic respiratory failure with hypoxia: Secondary | ICD-10-CM | POA: Diagnosis not present

## 2016-08-05 DIAGNOSIS — I350 Nonrheumatic aortic (valve) stenosis: Secondary | ICD-10-CM | POA: Diagnosis not present

## 2016-08-05 LAB — CULTURE, BLOOD (ROUTINE X 2)
CULTURE: NO GROWTH
Culture: NO GROWTH

## 2016-08-05 LAB — BASIC METABOLIC PANEL
ANION GAP: 9 (ref 5–15)
BUN: 37 mg/dL — AB (ref 6–20)
CHLORIDE: 84 mmol/L — AB (ref 101–111)
CO2: 44 mmol/L — ABNORMAL HIGH (ref 22–32)
Calcium: 9.7 mg/dL (ref 8.9–10.3)
Creatinine, Ser: 0.98 mg/dL (ref 0.44–1.00)
GFR calc Af Amer: >60 mL/min (ref >60)
GFR calc non Af Amer: 57 mL/min — ABNORMAL LOW (ref >60)
Glucose, Bld: 204 mg/dL — ABNORMAL HIGH (ref 65–99)
POTASSIUM: 4.9 mmol/L (ref 3.5–5.1)
SODIUM: 137 mmol/L (ref 135–145)

## 2016-08-05 LAB — GLUCOSE, CAPILLARY
GLUCOSE-CAPILLARY: 202 mg/dL — AB (ref 65–99)
GLUCOSE-CAPILLARY: 192 mg/dL — AB (ref 65–99)
GLUCOSE-CAPILLARY: 161 mg/dL — AB (ref 65–99)
GLUCOSE-CAPILLARY: 134 mg/dL — AB (ref 65–99)

## 2016-08-05 MED ORDER — ACETAZOLAMIDE 250 MG PO TABS
250.0000 mg | ORAL_TABLET | Freq: Three times a day (TID) | ORAL | Status: DC
  Administered 2016-08-05 – 2016-08-09 (×8): 250 mg via ORAL
  Filled 2016-08-05 (×14): qty 1

## 2016-08-05 MED ORDER — INSULIN ASPART 100 UNIT/ML ~~LOC~~ SOLN: 0.0000 [IU] | Freq: Every day | SUBCUTANEOUS | Status: DC

## 2016-08-05 MED ORDER — ACETAMINOPHEN-CODEINE #3 300-30 MG PO TABS
1.0000 | ORAL_TABLET | Freq: Three times a day (TID) | ORAL | Status: DC | PRN
  Administered 2016-08-05 – 2016-08-10 (×5): 1 via ORAL
  Filled 2016-08-05 (×5): qty 1

## 2016-08-05 MED ORDER — INSULIN ASPART 100 UNIT/ML ~~LOC~~ SOLN
0.0000 [IU] | Freq: Three times a day (TID) | SUBCUTANEOUS | Status: DC
  Administered 2016-08-05: 4 [IU] via SUBCUTANEOUS
  Administered 2016-08-06 – 2016-08-07 (×4): 7 [IU] via SUBCUTANEOUS
  Administered 2016-08-07: 3 [IU] via SUBCUTANEOUS
  Administered 2016-08-07: 4 [IU] via SUBCUTANEOUS
  Administered 2016-08-08: 20 [IU] via SUBCUTANEOUS
  Administered 2016-08-08 – 2016-08-09 (×2): 4 [IU] via SUBCUTANEOUS
  Administered 2016-08-09: 20 [IU] via SUBCUTANEOUS
  Administered 2016-08-10: 7 [IU] via SUBCUTANEOUS
  Administered 2016-08-10: 4 [IU] via SUBCUTANEOUS
  Administered 2016-08-11: 3 [IU] via SUBCUTANEOUS

## 2016-08-05 MED ORDER — PREDNISONE 20 MG PO TABS
40.0000 mg | ORAL_TABLET | Freq: Every day | ORAL | Status: DC
  Administered 2016-08-06 – 2016-08-10 (×5): 40 mg via ORAL
  Filled 2016-08-05 (×5): qty 2

## 2016-08-05 NOTE — Care Management Note (Addendum)
Case Management Note  Patient Details  Name: Jillian Wood MRN: [id number] Date of Birth: [birth year]  Subjective/Objective:   Afib, COPD, CHF exacerbation                 Action/Plan: Discharge Planning:  NCM spoke to pt and lives at home with husband and sons. Has home oxygen and CPAP. Will arrange oxygen provider to go to home to check oxygen once dc. Offered choice for [facility]. Pt refusing HH RN, PT and OT. Waiting final recommendations for home.   PCP Unk Pinto MD   08/06/2016 1215 Provided pt with Eliquis 30 day free trial card. Contacted [facility] in [place] and they do have in stock. Checking benefits. Sleep study appt arranged at [facility] for 08/29/2016 at 8 pm. Provided pt with information. Pt will need 3n1 wide, Rollator, Bipap, neb machine, and oxygen for home. MD will enter orders for oxygen and Bipap once pt oxygen has been weaned to acceptable rate for home. Will contact DME rep when all orders are in system.  S/W TOYA @ [employer] # [phone]   ELIQUIS  5 MG BID 30 /60 TAB   COVER- YES  CO-PAY- $156.58   90 % COVERAGE GAP  TIER- 3 DRUG  PRIOR APPROVAL- NO  PHARMACY: [facility]   Expected Discharge Date:                Expected Discharge Plan:  [place]  In-House Referral:  NA  Discharge planning Services  CM Consult  Post Acute Care Choice:  Home Health Choice offered to:  Patient  DME Arranged:  N/A DME Agency:  NA  HH Arranged:  Patient Refused [place] Agency:  NA  Status of Service:  Completed, signed off  If discussed at Long Length of Stay Meetings, dates discussed:    Additional Comments:  Erenest Rasher, RN 08/05/2016, 5:50 PM

## 2016-08-05 NOTE — Progress Notes (Signed)
[place] TEAM 1 - Stepdown/ICU TEAM  Jillian Wood  [id number] DOB: 1946/08/04 DOA: 07/31/2016 PCP: Alesia Richards, MD    Brief Narrative:  70 y.o. female with a Hx of COPD and chronic respiratory failure on 4 L of oxygen, OSA, hypothyroidism, diastolic CHF, type 2 diabetes, and tobacco abuse who was admitted 07/31/16 with acute on chronic respiratory failure secondary to a COPD exacerbation and acute on chronic diastolic CHF.  Subjective: The patient is resting comfortably in a bedside chair.  She reports her shortness of breath has not completely resolved but is much improved.  She denies chest pain nausea vomiting or abdominal pain.  She states that she uses anywhere from 4-6 L of nasal cannula oxygen at home at her baseline depending upon whether she is ambulating or not.  Assessment & Plan:  Acute on chronic respiratory failure with hypoxia Continues to require high flow nasal cannula oxygen with FiO2 80% and flow rate 40 L/m - multifactorial with causes listed below - wean O2 as able   Acute exacerbation COPD Appears to have resolved with no wheezing on exam at this time - rapidly taper steroids and follow  Acute on chronic diastolic CHF  Net negative ~2.3L since admit - baseline wgt appears to be ~110kg - resume home diuretic and follow creatinine  Filed Weights   08/03/16 0226 08/04/16 0320 08/05/16 0225  Weight: 112 kg (247 lb) 109.8 kg (242 lb) 110.2 kg (243 lb)    OSA noncompliant with CPAP Will need outpt sleep study   Atrial flutter / paroxysmal SVT  Rate controlled on oral Cardizem/metoprolol - Eliquis started given CHA2DS2VASc of 4 - Cards following    Demand ischemia flat low level troponin 0.04 > 0.05 c/w demand ischemia per Cards   AoS Mild AS noted on echo. Valve area (VTI): 1.05 cm^2. Valve area (Vmax): 1.09 cm^2. Valve area (Vmean): 1.05 cm^2.  Pulmonary HTN pulmonary artery systolic pressure 70 mm Hg due to O2 dependent COPD and untreated  OSA  Essential hypertension Currently controlled on Cardizem and Lopressor  Hyperlipidemia Continue Pravachol  Hypothyroidism Continue Synthroid - TSH 1.995  GERD  Continue Pepcid  Type 2 diabetes in obese A1c 5.2% on 06/06/16 - CBG not at goal - adjust treatment regimen - wean steroids rapidly  Hypokalemia Repleted  Morbid obesity - Body mass index is 41.71 kg/m.  DVT prophylaxis: Eliquis Code Status: FULL CODE Family Communication: Spoke with son at bedside Disposition Plan: SDU  Consultants:  [facility]  Procedures: TTE 08/02/16: EF 65-70 percent, no regional wall motion abnormalities, aortic valve stenosis  Antimicrobials:  Azithromycin 11/15 >  Objective: Blood pressure 137/77, pulse 74, temperature (!) 96.5 F (35.8 C), temperature source Axillary, resp. rate 16, height 5\' 4"  (1.626 m), weight 110.2 kg (243 lb), SpO2 98 %.  Intake/Output Summary (Last 24 hours) at 08/05/16 1540 Last data filed at 08/05/16 1231  Gross per 24 hour  Intake              793 ml  Output             1475 ml  Net             -682 ml   Filed Weights   08/03/16 0226 08/04/16 0320 08/05/16 0225  Weight: 112 kg (247 lb) 109.8 kg (242 lb) 110.2 kg (243 lb)    Examination: General: No acute respiratory distress At rest on HFNC Lungs: Very distant breath sounds throughout all fields with  no appreciable wheezing Cardiovascular: Regular rate and rhythm without murmur - distant heart sounds Abdomen: Nontender, morbidly obese, soft, bowel sounds positive Extremities: Changes of chronic venous stasis bilateral lower extremities with 2+ edema without erythema  CBC:  Recent Labs Lab 07/31/16 1805 08/03/16 0220  WBC 5.8 6.7  HGB 10.6* 10.5*  HCT 40.5 37.2  MCV 92.5 85.7  PLT 237 123456   Basic Metabolic Panel:  Recent Labs Lab 08/01/16 1544 08/02/16 0238 08/03/16 0220 08/04/16 0526 08/05/16 0228  NA 141 140 140 138 137  K 4.6 4.4 3.9 3.4* 4.9  CL 85* 85* 85* 81* 84*   CO2 40* 43* 41* 45* 44*  GLUCOSE 94 157* 160* 167* 204*  BUN 17 18 28* 37* 37*  CREATININE 0.66 0.66 0.71 1.02* 0.98  CALCIUM 9.5 9.3 9.3 9.5 9.7  MG 1.6* 1.9 2.1 2.1  --    GFR: Estimated Creatinine Clearance: 64.8 mL/min (by C-G formula based on SCr of 0.98 mg/dL).  Liver Function Tests: No results for input(s): AST, ALT, ALKPHOS, BILITOT, PROT, ALBUMIN in the last 168 hours. No results for input(s): LIPASE, AMYLASE in the last 168 hours. No results for input(s): AMMONIA in the last 168 hours.   Cardiac Enzymes:  Recent Labs Lab 07/31/16 2116 08/01/16 0257 08/01/16 0858  TROPONINI <0.03 0.04* 0.05*    HbA1C: Hgb A1c MFr Bld  Date/Time Value Ref Range Status  06/06/2016 03:24 PM 5.2 <5.7 % Final    Comment:      For the purpose of screening for the presence of diabetes:   <5.7%       Consistent with the absence of diabetes 5.7-6.4 %   Consistent with increased risk for diabetes (prediabetes) >=6.5 %     Consistent with diabetes   This assay result is consistent with a decreased risk of diabetes.   Currently, no consensus exists regarding use of hemoglobin A1c for diagnosis of diabetes in children.   According to American Diabetes Association (ADA) guidelines, hemoglobin A1c <7.0% represents optimal control in non-pregnant diabetic patients. Different metrics may apply to specific patient populations. Standards of Medical Care in Diabetes (ADA).     02/29/2016 02:54 PM 5.7 (H) <5.7 % Final    Comment:      For someone without known diabetes, a hemoglobin A1c value between 5.7% and 6.4% is consistent with prediabetes and should be confirmed with a follow-up test.   For someone with known diabetes, a value <7% indicates that their diabetes is well controlled. A1c targets should be individualized based on duration of diabetes, age, co-morbid conditions and other considerations.   This assay result is consistent with an increased risk of diabetes.     Currently, no consensus exists regarding use of hemoglobin A1c for diagnosis of diabetes in children.       CBG:  Recent Labs Lab 08/04/16 1141 08/04/16 1641 08/04/16 2110 08/05/16 0808 08/05/16 1319  GLUCAP 248* 274* 203* 202* 192*    Recent Results (from the past 240 hour(s))  Blood culture (routine x 2)     Status: None   Collection Time: 07/31/16  9:07 PM  Result Value Ref Range Status   Specimen Description BLOOD RIGHT HAND  Final   Special Requests BOTTLES DRAWN AEROBIC AND ANAEROBIC 5CC  Final   Culture NO GROWTH 5 DAYS  Final   Report Status 08/05/2016 FINAL  Final  Culture, blood (routine x 2) Call MD if unable to obtain prior to antibiotics being given  Status: None   Collection Time: 07/31/16  9:08 PM  Result Value Ref Range Status   Specimen Description BLOOD LEFT HAND  Final   Special Requests BOTTLES DRAWN AEROBIC AND ANAEROBIC 5CC  Final   Culture NO GROWTH 5 DAYS  Final   Report Status 08/05/2016 FINAL  Final  Respiratory Panel by PCR     Status: None   Collection Time: 07/31/16 10:25 PM  Result Value Ref Range Status   Adenovirus NOT DETECTED NOT DETECTED Final   Coronavirus 229E NOT DETECTED NOT DETECTED Final   Coronavirus HKU1 NOT DETECTED NOT DETECTED Final   Coronavirus NL63 NOT DETECTED NOT DETECTED Final   Coronavirus OC43 NOT DETECTED NOT DETECTED Final   Metapneumovirus NOT DETECTED NOT DETECTED Final   Rhinovirus / Enterovirus NOT DETECTED NOT DETECTED Final   Influenza A NOT DETECTED NOT DETECTED Final   Influenza B NOT DETECTED NOT DETECTED Final   Parainfluenza Virus 1 NOT DETECTED NOT DETECTED Final   Parainfluenza Virus 2 NOT DETECTED NOT DETECTED Final   Parainfluenza Virus 3 NOT DETECTED NOT DETECTED Final   Parainfluenza Virus 4 NOT DETECTED NOT DETECTED Final   Respiratory Syncytial Virus NOT DETECTED NOT DETECTED Final   Bordetella pertussis NOT DETECTED NOT DETECTED Final   Chlamydophila pneumoniae NOT DETECTED NOT DETECTED  Final   Mycoplasma pneumoniae NOT DETECTED NOT DETECTED Final  MRSA PCR Screening     Status: None   Collection Time: 08/01/16  7:05 AM  Result Value Ref Range Status   MRSA by PCR NEGATIVE NEGATIVE Final    Comment:        The GeneXpert MRSA Assay (FDA approved for NASAL specimens only), is one component of a comprehensive MRSA colonization surveillance program. It is not intended to diagnose MRSA infection nor to guide or monitor treatment for MRSA infections.      Scheduled Meds: . apixaban  5 mg Oral BID  . cholecalciferol  1,000 Units Oral Daily  . diltiazem  90 mg Oral Q6H  . DULoxetine  60 mg Oral Daily  . famotidine  20 mg Oral BID  . guaiFENesin  600 mg Oral Daily  . insulin aspart  0-5 Units Subcutaneous QHS  . insulin aspart  0-9 Units Subcutaneous TID WC  . ipratropium  0.5 mg Nebulization Q6H  . levalbuterol  1.25 mg Nebulization Q6H  . levothyroxine  175 mcg Oral QAC breakfast  . methylPREDNISolone (SOLU-MEDROL) injection  60 mg Intravenous BID  . metoprolol tartrate  25 mg Oral BID  . nicotine  14 mg Transdermal Daily  . polyethylene glycol  17 g Oral Daily  . pravastatin  40 mg Oral Daily  . sodium chloride flush  3 mL Intravenous Q12H     LOS: 5 days   Cherene Altes, MD [facility] Office  [phone] Pager - Text Page per Amion as per below:  On-Call/Text Page:      [email or web address]      password [email or web address]  If 7PM-7AM, please contact night-coverage [email or web address] Password [email or web address] 08/05/2016, 3:40 PM

## 2016-08-05 NOTE — Care Management Important Message (Signed)
Important Message  Patient Details  Name: Jillian Wood MRN: [id number] Date of Birth: 16-Nov-1945   Medicare Important Message Given:  Yes    Nathen May 08/05/2016, 10:45 AM

## 2016-08-05 NOTE — Progress Notes (Signed)
Patient Name: Jillian Wood Date of Encounter: 08/05/2016  Primary Cardiologist: Dr. Gwenlyn Found  Electrophysiologist: Dr. 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 Receiving breathing treatment.   Inpatient Medications    Scheduled Meds: . apixaban  5 mg Oral BID  . cholecalciferol  1,000 Units Oral Daily  . diltiazem  90 mg Oral Q6H  . DULoxetine  60 mg Oral Daily  . famotidine  20 mg Oral BID  . guaiFENesin  600 mg Oral Daily  . insulin aspart  0-5 Units Subcutaneous QHS  . insulin aspart  0-9 Units Subcutaneous TID WC  . ipratropium  0.5 mg Nebulization Q6H  . levalbuterol  1.25 mg Nebulization Q6H  . levothyroxine  175 mcg Oral QAC breakfast  . methylPREDNISolone (SOLU-MEDROL) injection  60 mg Intravenous BID  . metoprolol tartrate  25 mg Oral BID  . nicotine  14 mg Transdermal Daily  . polyethylene glycol  17 g Oral Daily  . pravastatin  40 mg Oral Daily  . sodium chloride flush  3 mL Intravenous Q12H   Continuous Infusions:  PRN Meds: acetaminophen, acetaminophen-codeine, diazepam, ondansetron, zolpidem   Vital Signs    Vitals:   08/05/16 0600 08/05/16 0601 08/05/16 0800 08/05/16 0819  BP: (!) 150/76 (!) 150/76  97/71  Pulse: 73   74  Resp: 16   16  Temp:   97.9 F (36.6 C)   TempSrc:   Axillary   SpO2: 96%   97%  Weight:      Height:        Intake/Output Summary (Last 24 hours) at 08/05/16 1028 Last data filed at 08/05/16  [id number]  Gross per 24 hour  Intake             1010 ml  Output             1475 ml  Net             -465 ml   Filed Weights   08/03/16 0226 08/04/16 0320 08/05/16 0225  Weight: 247 lb (112 kg) 242 lb (109.8 kg) 243 lb (110.2 kg)    Physical Exam   GEN: Well nourished, well developed, in no acute distress.  HEENT: Grossly normal.  Neck: Supple, no JVD, carotid bruits, or masses. Cardiac: irregular rhythm, regular rate, no murmurs, rubs, or gallops. No clubbing, cyanosis, edema.  Radials/DP/PT 2+ and equal bilaterally.  Respiratory:  Respirations regular and unlabored, clear to auscultation bilaterally. GI: Soft, nontender, nondistended, BS + x 4. MS: no deformity or atrophy. Skin: warm and dry, no rash. Neuro:  Strength and sensation are intact. Psych: AAOx3.  Normal affect.  Labs    CBC  Recent Labs  08/03/16 0220  WBC 6.7  HGB 10.5*  HCT 37.2  MCV 85.7  PLT 123456   Basic Metabolic Panel  Recent Labs  08/03/16 0220 08/04/16 0526 08/05/16 0228  NA 140 138 137  K  3.9 3.4* 4.9  CL 85* 81* 84*  CO2 41* 45* 44*  GLUCOSE 160* 167* 204*  BUN 28* 37* 37*  CREATININE 0.71 1.02* 0.98  CALCIUM 9.3 9.5 9.7  MG 2.1 2.1  --    Liver Function Tests No results for input(s): AST, ALT, ALKPHOS, BILITOT, PROT, ALBUMIN in the last 72 hours. No results for input(s): LIPASE, AMYLASE in the last 72 hours. Cardiac Enzymes No results for input(s): CKTOTAL, CKMB, CKMBINDEX, TROPONINI in the last 72 hours. BNP Invalid input(s): POCBNP D-Dimer No results for input(s): DDIMER in the last 72 hours. Hemoglobin A1C No results for input(s): HGBA1C in the last 72 hours. Fasting Lipid Panel No results for input(s): CHOL, HDL, LDLCALC, TRIG, CHOLHDL, LDLDIRECT in the last 72 hours. Thyroid Function Tests No results for input(s): TSH, T4TOTAL, T3FREE, THYROIDAB in the last 72 hours.  Invalid input(s): FREET3  Telemetry    Atrial flutter 77 bpm - Personally Reviewed  Radiology      No results found.  Cardiac Studies   2D echo 08/02/16 Study Conclusions  - Left ventricle: The cavity size was moderately reduced. Wall   thickness was increased in a pattern of moderate LVH. The   estimated ejection fraction was in the range of 65% to 70%. Wall   motion was normal; there were no regional wall motion   abnormalities. - Aortic valve: Severely calcified annulus. Moderately thickened,   moderately calcified leaflets. There was mild stenosis. Valve   area (VTI): 1.05 cm^2. Valve area (Vmax): 1.09 cm^2. Valve area   (Vmean): 1.05 cm^2. - Mitral valve: Moderately calcified annulus. - Left atrium: The atrium was severely dilated. - Right atrium: The atrium was mildly dilated. - Pulmonary arteries: Systolic pressure was severely increased. PA   peak pressure: 70 mm Hg (S).   Patient Profile     70 yo female with hx of PSVT and hx of chest pain and dCHF.  Previous nuc study 2015  Negative for ischemia  Previous echo with  EF 65-70% + G1DD.  LA mildly dilated.  She has COPD on home oxygen, continues to smoke, OSA non compliant and DM-2. Admitted for a/c respiratory failure from acute COPD exacerbation + diastolic CHF. After arrival pt developed A flutter with 2:1 conduction. CHA2DS2VASC score 4 for HTN, Age, female.  Assessment & Plan     1. Acute on Chronic Hypoxic Respiratory Failure: 2/2 acute COPD exacerbation + diastolic HF: on antibiotics, steroids and inhalers. Per IM.   2. Acute on Chronic Diastolic HF: echo 0000000 showed normal LVEF at 65-70% with moderate LVF. Normal wall motion.   3. Atrial Flutter: remains in atrial flutter on telemetry. Rate controlled on oral Cardizem/metoprolol. Eliquis started given CHA2DS2VASc of 4. She denies formal diagnosis of OSA, although this is listed in her chart. Recommend outpatient sleep study to assess as untreated OSA could be contributing.   4. PSVT: on Cardizem and metoprolol.   5. Abnormal Troponin: flat low level  trend 0.04>>0.05, c/w demand ischemia.   6. HTN: BP soft but stable in the upper 0000000 systolic. Monitor   7. HLD: on statin therapy with pravastatin   8. Hypothyroidism: Continue Synthroid. TSH 1.995. IM managing.   9. T2DM: per IM. Currently on SSI.   10. Aortic Stenosis: moderately thickened, moderately calcified leaflets. Mild AS noted on echo. Valve area (VTI): 1.05 cm^2. Valve area (Vmax): 1.09 cm^2. Valve area (Vmean): 1.05 cm^2.  11. Pulmonary HTN: per echo, pulmonary arteries: Systolic pressure was severely increased.  PA peak pressure: 70 mm Hg. In the setting of O2 dependent COPD and untreated OSA.   12. ? OSA: recommend OP sleep study.     Signed, Lyda Jester, PA-C  08/05/2016, 10:28 AM   Agree with note written by Ellen Henri  PAC  Pt well known to me. Admitted with SOB. Multifactorial secondary to COPD exacerbation (still smokes) and Diastolic HF. EF preserved. Good diuresis. In Aflutter with CVR on Eliquis. EF nl, DD. Agree with current Rx. She has mild to mod AS as well. Will consider OP DCCV 4-6 weeks.  Quay Burow 08/05/2016 2:39 PM

## 2016-08-05 NOTE — Plan of Care (Signed)
Problem: Activity: Goal: Risk for activity intolerance will decrease Outcome: Progressing Discussed movement and diet of decreasing sugar intake but balancing carbs/fats/protiens with each meal with some teach back displayed.

## 2016-08-05 NOTE — Progress Notes (Signed)
Occupational Therapy Treatment Patient Details Name: Jillian Wood MRN: [id number] DOB: 1946-03-08 Today's Date: 08/05/2016    History of present illness Pt admitted with acute on chronic respiratory failure with hypoxia requiring Bipap. She uses 4-6L 02 at home depending on activity level. PMH: COPD, tobacco abuse, CHF, obesity, DM, HTN, anxiety and depression.   OT comments  Pt with increased activity tolerance today with increased ability to participate in adls. Pt was min guard for transfers, min guard for toileting and completed limited LE dressing with min assist. Spoke with son who stated pt does not want HHOT at d/c.  She has a routine at home and is very comfortable with the assist she gets from her son only when needed. Based on how pt did today, this is reasonable.  Follow Up Recommendations  No OT follow up;Supervision/Assistance - 24 hour    Equipment Recommendations  3 in 1 bedside comode    Recommendations for Other Services      Precautions / Restrictions Precautions Precautions: Fall Precaution Comments: watch 02 sats Restrictions Weight Bearing Restrictions: No       Mobility Bed Mobility Overal bed mobility: Needs Assistance Bed Mobility: Supine to Sit     Supine to sit: Min assist;HOB elevated     General bed mobility comments: used bedrails  Transfers Overall transfer level: Needs assistance Equipment used: 1 person hand held assist Transfers: Sit to/from [facility] Sit to Stand: Min guard Stand pivot transfers: Min guard       General transfer comment: Pt on high flow O2 w sats around 95%.  Min guard provided due to feeling dizzy and multiple lines    Balance Overall balance assessment: Needs assistance Sitting-balance support: Feet supported Sitting balance-Leahy Scale: Good     Standing balance support: During functional activity;Single extremity supported Standing balance-Leahy Scale: Poor Standing balance comment:  Pt requires outside support to stand.  Much better than last week.                   ADL Overall ADL's : Needs assistance/impaired Eating/Feeding: Independent;Sitting               Upper Body Dressing : Set up;Sitting   Lower Body Dressing: Minimal assistance;Sit to/from stand;Cueing for compensatory techniques Lower Body Dressing Details (indicate cue type and reason): Pt now able to cross legs to donn socks and start underwear and pants. Toilet Transfer: Min guard;BSC;Cueing for Office manager Details (indicate cue type and reason): Pt pivoted with help for line management Toileting- Clothing Manipulation and Hygiene: Min guard;Sit to/from stand Toileting - Clothing Manipulation Details (indicate cue type and reason): Pt asked for therapist to clean her but with encouragement was fully independent.     Functional mobility during ADLs: Min guard General ADL Comments: Pt on high flow oxygen.      Vision                     Perception     Praxis      Cognition   Behavior During Therapy: [facility] for tasks assessed/performed Overall Cognitive Status: Within Functional Limits for tasks assessed                       Extremity/Trunk Assessment               Exercises     Shoulder Instructions       General Comments      Pertinent  Vitals/ Pain       Pain Assessment: No/denies pain  Home Living                                          Prior Functioning/Environment              Frequency  Min 2X/week        Progress Toward Goals  OT Goals(current goals can now be found in the care plan section)  Progress towards OT goals: Progressing toward goals  Acute Rehab OT Goals Patient Stated Goal: return home with her family OT Goal Formulation: With patient Time For Goal Achievement: 08/16/16 Potential to Achieve Goals: Good ADL Goals Pt Will Perform Grooming: with supervision;standing Pt Will Perform  Lower Body Bathing: with supervision;sit to/from stand;with adaptive equipment Pt Will Perform Lower Body Dressing: with supervision;with adaptive equipment;sit to/from stand Pt Will Transfer to Toilet: with supervision;ambulating;bedside commode Pt Will Perform Toileting - Clothing Manipulation and hygiene: with supervision;sit to/from stand Additional ADL Goal #1: Pt will perform bed mobility with supervision. Additional ADL Goal #2: Pt will generalize energy conservation strategies in ADL and mobility.  Plan Discharge plan needs to be updated    Co-evaluation                 End of Session Equipment Utilized During Treatment: Rolling walker;Oxygen   Activity Tolerance Patient tolerated treatment well   Patient Left in chair;with call bell/phone within reach;with chair alarm set;with nursing/sitter in room   Nurse Communication Mobility status        Time: 1130-1153 OT Time Calculation (min): 23 min  Charges: OT General Charges $OT Visit: 1 Procedure OT Treatments $Self Care/Home Management : 23-37 mins  Glenford Peers 08/05/2016, 12:08 PM  [phone]

## 2016-08-05 NOTE — Progress Notes (Signed)
Inpatient Diabetes Program Recommendations  AACE/ADA: New Consensus Statement on Inpatient Glycemic Control (2015)  Target Ranges:  Prepandial:   less than 140 mg/dL      Peak postprandial:   less than 180 mg/dL (1-2 hours)      Critically ill patients:  140 - 180 mg/dL   Lab Results  Component Value Date   GLUCAP 202 (H) 08/05/2016   HGBA1C 5.2 06/06/2016    Review of Glycemic Control  Diabetes history: DM 2 Outpatient Diabetes medications: Metformin 500 mg 4x/day Current orders for Inpatient glycemic control: Novolog Sensitive TID + HS scale  Inpatient Diabetes Program Recommendations:   Patient receiving IV Solumedrol 60 mg Q12 hours. Glucose trend in the 200's. Please consider increasing correction to Novolog Moderate TID.  Thanks,  Tama Headings RN, MSN, [facility] Inpatient Diabetes Coordinator Team Pager [phone] (8a-5p)

## 2016-08-05 NOTE — Progress Notes (Signed)
PT Cancellation Note  Patient Details Name: EMALI WOZNY MRN: [id number] DOB: 1945-10-24   Cancelled Treatment:    Reason Eval/Treat Not Completed: Other (comment) (stated she just ate and couldn't do anything right now).    Shary Decamp [place] 08/05/2016, 2:32 PM [employer] PT [phone]

## 2016-08-06 DIAGNOSIS — I5033 Acute on chronic diastolic (congestive) heart failure: Secondary | ICD-10-CM

## 2016-08-06 DIAGNOSIS — I420 Dilated cardiomyopathy: Secondary | ICD-10-CM | POA: Diagnosis not present

## 2016-08-06 DIAGNOSIS — I350 Nonrheumatic aortic (valve) stenosis: Secondary | ICD-10-CM | POA: Diagnosis not present

## 2016-08-06 DIAGNOSIS — I10 Essential (primary) hypertension: Secondary | ICD-10-CM | POA: Diagnosis not present

## 2016-08-06 DIAGNOSIS — I272 Pulmonary hypertension, unspecified: Secondary | ICD-10-CM

## 2016-08-06 DIAGNOSIS — J9621 Acute and chronic respiratory failure with hypoxia: Secondary | ICD-10-CM | POA: Diagnosis not present

## 2016-08-06 DIAGNOSIS — J441 Chronic obstructive pulmonary disease with (acute) exacerbation: Secondary | ICD-10-CM | POA: Diagnosis not present

## 2016-08-06 DIAGNOSIS — I4891 Unspecified atrial fibrillation: Secondary | ICD-10-CM | POA: Diagnosis not present

## 2016-08-06 LAB — GLUCOSE, CAPILLARY
GLUCOSE-CAPILLARY: 229 mg/dL — AB (ref 65–99)
GLUCOSE-CAPILLARY: 239 mg/dL — AB (ref 65–99)
GLUCOSE-CAPILLARY: 210 mg/dL — AB (ref 65–99)
Glucose-Capillary: 209 mg/dL — ABNORMAL HIGH (ref 65–99)

## 2016-08-06 LAB — BASIC METABOLIC PANEL
Anion gap: 9 (ref 5–15)
BUN: 37 mg/dL — AB (ref 6–20)
CO2: 41 mmol/L — AB (ref 22–32)
Calcium: 9.4 mg/dL (ref 8.9–10.3)
Chloride: 85 mmol/L — ABNORMAL LOW (ref 101–111)
Creatinine, Ser: 0.99 mg/dL (ref 0.44–1.00)
GFR calc Af Amer: >60 mL/min (ref >60)
GFR, EST NON AFRICAN AMERICAN: 56 mL/min — AB (ref >60)
GLUCOSE: 197 mg/dL — AB (ref 65–99)
POTASSIUM: 5.1 mmol/L (ref 3.5–5.1)
Sodium: 135 mmol/L (ref 135–145)

## 2016-08-06 LAB — CBC
HEMATOCRIT: 38.4 % (ref 36.0–46.0)
Hemoglobin: 10.6 g/dL — ABNORMAL LOW (ref 12.0–15.0)
MCH: 23.9 pg — AB (ref 26.0–34.0)
MCHC: 27.6 g/dL — AB (ref 30.0–36.0)
MCV: 86.7 fL (ref 78.0–100.0)
Platelets: 290 10*3/uL (ref 150–400)
RBC: 4.43 MIL/uL (ref 3.87–5.11)
RDW: 15.6 % — AB (ref 11.5–15.5)
WBC: 8 10*3/uL (ref 4.0–10.5)

## 2016-08-06 NOTE — Progress Notes (Signed)
Subjective:  Feeling much better today. I/O neg 3 L  Objective:  Temp:  [96.5 F (35.8 C)-98.8 F (37.1 C)] 97.3 F (36.3 C) (11/21 0300) Pulse Rate:  [73-77] 73 (11/21 0300) Resp:  [16-21] 16 (11/21 0354) BP: (111-141)/(67-111) 111/75 (11/21 0647) SpO2:  [90 %-100 %] 95 % (11/21 0815) FiO2 (%):  [70 %-100 %] 70 % (11/21 0815) Weight:  [238 lb (108 kg)] 238 lb (108 kg) (11/21 0300) Weight change: -5 lb (-2.268 kg)  Intake/Output from previous day: 11/20 0701 - 11/21 0700 In: 3 [I.V.:3] Out: 850 [Urine:850]  Intake/Output from this shift: No intake/output data recorded.  Physical Exam: General appearance: alert and no distress Neck: no adenopathy, no carotid bruit, no JVD, supple, symmetrical, trachea midline and thyroid not enlarged, symmetric, no tenderness/mass/nodules Lungs: clear to auscultation bilaterally Heart: irregularly irregular rhythm Extremities: extremities normal, atraumatic, no cyanosis or edema  Lab Results: Results for orders placed or performed during the hospital encounter of 07/31/16 (from the past 48 hour(s))  Glucose, capillary     Status: Abnormal   Collection Time: 08/04/16 11:41 AM  Result Value Ref Range   Glucose-Capillary 248 (H) 65 - 99 mg/dL  Glucose, capillary     Status: Abnormal   Collection Time: 08/04/16  4:41 PM  Result Value Ref Range   Glucose-Capillary 274 (H) 65 - 99 mg/dL  Glucose, capillary     Status: Abnormal   Collection Time: 08/04/16  9:10 PM  Result Value Ref Range   Glucose-Capillary 203 (H) 65 - 99 mg/dL   Comment 1 Notify RN   Basic metabolic panel     Status: Abnormal   Collection Time: 08/05/16  2:28 AM  Result Value Ref Range   Sodium 137 135 - 145 mmol/L   Potassium 4.9 3.5 - 5.1 mmol/L    Comment: DELTA CHECK NOTED   Chloride 84 (L) 101 - 111 mmol/L   CO2 44 (H) 22 - 32 mmol/L   Glucose, Bld 204 (H) 65 - 99 mg/dL   BUN 37 (H) 6 - 20 mg/dL   Creatinine, Ser 0.98 0.44 - 1.00 mg/dL   Calcium 9.7  8.9 - 10.3 mg/dL   GFR calc non Af Amer 57 (L) >60 mL/min   GFR calc Af Amer >60 >60 mL/min    Comment: (NOTE) The eGFR has been calculated using the CKD EPI equation. This calculation has not been validated in all clinical situations. eGFR's persistently <60 mL/min signify possible Chronic Kidney Disease.    Anion gap 9 5 - 15  Glucose, capillary     Status: Abnormal   Collection Time: 08/05/16  8:08 AM  Result Value Ref Range   Glucose-Capillary 202 (H) 65 - 99 mg/dL  Glucose, capillary     Status: Abnormal   Collection Time: 08/05/16  1:19 PM  Result Value Ref Range   Glucose-Capillary 192 (H) 65 - 99 mg/dL  Glucose, capillary     Status: Abnormal   Collection Time: 08/05/16  4:45 PM  Result Value Ref Range   Glucose-Capillary 161 (H) 65 - 99 mg/dL  Glucose, capillary     Status: Abnormal   Collection Time: 08/05/16  9:45 PM  Result Value Ref Range   Glucose-Capillary 134 (H) 65 - 99 mg/dL  Basic metabolic panel     Status: Abnormal   Collection Time: 08/06/16  3:00 AM  Result Value Ref Range   Sodium 135 135 - 145 mmol/L   Potassium 5.1 3.5 -  5.1 mmol/L   Chloride 85 (L) 101 - 111 mmol/L   CO2 41 (H) 22 - 32 mmol/L   Glucose, Bld 197 (H) 65 - 99 mg/dL   BUN 37 (H) 6 - 20 mg/dL   Creatinine, Ser 0.99 0.44 - 1.00 mg/dL   Calcium 9.4 8.9 - 10.3 mg/dL   GFR calc non Af Amer 56 (L) >60 mL/min   GFR calc Af Amer >60 >60 mL/min    Comment: (NOTE) The eGFR has been calculated using the CKD EPI equation. This calculation has not been validated in all clinical situations. eGFR's persistently <60 mL/min signify possible Chronic Kidney Disease.    Anion gap 9 5 - 15  CBC     Status: Abnormal   Collection Time: 08/06/16  3:00 AM  Result Value Ref Range   WBC 8.0 4.0 - 10.5 K/uL   RBC 4.43 3.87 - 5.11 MIL/uL   Hemoglobin 10.6 (L) 12.0 - 15.0 g/dL   HCT 38.4 36.0 - 46.0 %   MCV 86.7 78.0 - 100.0 fL   MCH 23.9 (L) 26.0 - 34.0 pg   MCHC 27.6 (L) 30.0 - 36.0 g/dL   RDW 15.6  (H) 11.5 - 15.5 %   Platelets 290 150 - 400 K/uL  Glucose, capillary     Status: Abnormal   Collection Time: 08/06/16  7:51 AM  Result Value Ref Range   Glucose-Capillary 229 (H) 65 - 99 mg/dL    Imaging: Imaging results have been reviewed  Tele- Aflutter with VR 70s   Assessment/Plan:   1. Principal Problem: 2.   Acute on chronic respiratory failure with hypoxia (HCC) 3. Active Problems: 4.   Essential hypertension 5.   Hyperlipidemia 6.   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   Hypothyroidism 8.   Paroxysmal SVT (supraventricular tachycardia) (HCC) 9.   Tobacco abuse 10.   Acute on chronic diastolic CHF (congestive heart failure) ([place]) 11.   GERD (gastroesophageal reflux disease) 12.   COPD with acute exacerbation ([place]) 13.   Atrial fibrillation with RVR ([place]) 14.   Diabetes mellitus type 2 in obese ([place]) 15. Aflutter  Time Spent Directly with Patient:  15 minutes  Length of Stay:  LOS: 6 days   Resp status significantly improved. I/O neg 3 L. No SOB. COPD exacerbation treated. Aflutter with CVR on BB and CCB. Can consolidate short acting Diltiazem to Cardizem CD. On Eliquis OAC ( The CHA2DSVASC2 score is  5) . Will S/O but avail for further questions. Please arrange for her to see a MLP TOC 7 in our practice aftedr DC then ROV with me 4-6 weeks. Will prob require OP DCCV.          Quay Burow 08/06/2016, 9:26 AM

## 2016-08-06 NOTE — Progress Notes (Signed)
PROGRESS NOTE    Jillian Wood  [id number] DOB: [birth year] DOA: 07/31/2016 PCP: Alesia Richards, MD   Brief Narrative:  70 y.o. WF PMHx Depression, Anxiety HTN, PSVT, HLD,Diabetes mellitus type 2 in obese uncontrolled with complications, COPD, on 4 L oxygen, OSA, GERD, Hypothyroidism, Tobacco abuse, dCHF,   Who presents with shortness of breath.Patient states that she has been having worsening shortness of breath for 4 days. She has dry cough, no chest pain, fever or chills. She has mild runny nose, no sore throat. She has to increase oxygen use from 4 L to 8 L. She speaks in full sentence. Patient denies nausea, vomiting, abdominal pain, diarrhea, symptoms of UTI or unilateral weakness. She has mild leg edema bilaterally. No tenderness over calf areas.    Subjective: 11/21  A/Ox4, positive acute on chronic SOB, negative CP. States continues to smoke. States feels significantly improved since admission. Sitting in chair comfortably.    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)   Hypothyroidism   Paroxysmal SVT (supraventricular tachycardia) (HCC)   Tobacco abuse   Acute on chronic diastolic CHF (congestive heart failure) (HCC)   GERD (gastroesophageal reflux disease)   COPD with acute exacerbation (HCC)   Atrial fibrillation with RVR (HCC)   Diabetes mellitus type 2 in obese (HCC)   Acute on chronic respiratory failure with hypoxia:  -Multifactorial to include COPD exacerbation, tobacco abuse, OSA noncompliant with CPAP, and CHF  -Complete 5 days of antibiotics -Ipratropium nebulizer QID -Xopenex QID -Solu-Medrol 60 mg BID -Flutter valve -BiPAP at night per respiratory therapy . -High flow O2 per respiratory therapy to maintain SPO2> 88% during the day -Ambulatory SPO2 -Patient with schedule Sleep Study  on 14 December  COPD exacerbation: -Nebulizers: scheduled Atrovent and Xopenex neb -Solu-Medrol 60 mg IV tid -Oral azithromycin for 5 days.  -Mucinex for cough  -Urine S. pneumococcal antigen -Blood culture x2, sputum culture, respiratory virus panel, Flu pcr negative  Acute on chronic Diastolic CHF (congestive heart failure)/Cardiomyopathy --strict I/O's since admission  -3.0 L -Daily weight (unknown base weight) Filed Weights   08/04/16 0320 08/05/16 0225 08/06/16 0300  Weight: 109.8 kg (242 lb) 110.2 kg (243 lb) 108 kg (238 lb)  -Cardizem 90 mg QID -Metoprolol 25 mg BID -Hold losartan. Once A. fib with RVR controlled restart if BP allows -Echocardiogram  -TSH pending -Consider cardiology consult once all information obtained.  HTN: -See CHF  New onset Atrial fibrillation with RVR ([place]): (CHA2DS2-VASc Score is 4,) -likely triggered by CHF exacerbation and hypoxia.  -Eliquis 5 mg BID  Pulmonary Hypertension -See CHF  HLD:  -Last LDL was 68 on 06/06/16 - Pravastatin 20 mg daily  Hypothyroidism:  -Last TSH was 6.25 on 06/06/16: TSH on 11/16 WNL -Synthroid 175 g daily  GERD: -Pepcid   DVT prophylaxis: Eliquis Code Status: Full Family Communication: None Disposition Plan: Home   Consultants:  None  Procedures/Significant Events:  11/17 Echocardiogram:Left ventricle: moderate LVH.-LVEF=  65% to 70%. -- Left atrium:  severely dilated. - Right atrium: mildly dilated. - Pulmonary arteries: PA peak pressure: 70 mm Hg (S).   VENTILATOR SETTINGS: High flow nasal cannula FiO2 60%  LPM 30   Cultures 11/15 blood 2 negative 11/15 respiratory virus panel negative 11/16 MRSA by PCR negative 11/16 strep pneumo urine antigen negative  Antimicrobials: Azithromycin 11/15>> 11/19  Devices    LINES / TUBES:      Continuous Infusions:    Objective: Vitals:   08/06/16 0300 08/06/16 0354 08/06/16 0647 08/06/16 0815  BP: (!) 141/89  111/75     Pulse: 73     Resp: 20 16    Temp: 97.3 F (36.3 C)     TempSrc: Axillary     SpO2: 100%   95%  Weight: 108 kg (238 lb)     Height:        Intake/Output Summary (Last 24 hours) at 08/06/16 0900 Last data filed at 08/05/16 1639  Gross per 24 hour  Intake                3 ml  Output              850 ml  Net             -847 ml   Filed Weights   08/04/16 0320 08/05/16 0225 08/06/16 0300  Weight: 109.8 kg (242 lb) 110.2 kg (243 lb) 108 kg (238 lb)    Examination:  General: A/O 4, positive acute on chronic respiratory distress Eyes: negative scleral hemorrhage, negative anisocoria, negative icterus ENT: Negative Runny nose, negative gingival bleeding, Neck:  Negative scars, masses, torticollis, lymphadenopathy, JVD Lungs: extremely poor air movement in all lung fields, without wheezes or crackles Cardiovascular: Irregular irregular rhythm and rate, without murmur gallop or rub normal S1 and S2 Abdomen: Morbidly obese, negative abdominal pain, nondistended, positive soft, bowel sounds, no rebound, no ascites, no appreciable mass Extremities: No significant cyanosis, clubbing, or edema bilateral lower extremities Skin: Negative rashes, lesions, ulcers Psychiatric:  Negative depression, negative anxiety, negative fatigue, negative mania  Central nervous system:  Cranial nerves II through XII intact, tongue/uvula midline, all extremities muscle strength 5/5, sensation intact throughout,negative dysarthria, negative expressive aphasia, negative receptive aphasia.  .     Data Reviewed: Care during the described time interval was provided by me .  I have reviewed this patient's available data, including medical history, events of note, physical examination, and all test results as part of my evaluation. I have personally reviewed and interpreted all radiology studies.  CBC:  Recent Labs Lab 07/31/16 1805 08/03/16 0220 08/06/16 0300  WBC 5.8 6.7 8.0  HGB 10.6* 10.5* 10.6*   HCT 40.5 37.2 38.4  MCV 92.5 85.7 86.7  PLT 237 304 [id number]   Basic Metabolic Panel:  Recent Labs Lab 08/01/16 1544 08/02/16 0238 08/03/16 0220 08/04/16 0526 08/05/16 0228 08/06/16 0300  NA 141 140 140 138 137 135  K 4.6 4.4 3.9 3.4* 4.9 5.1  CL 85* 85* 85* 81* 84* 85*  CO2 40* 43* 41* 45* 44* 41*  GLUCOSE 94 157* 160* 167* 204* 197*  BUN 17 18 28* 37* 37* 37*  CREATININE 0.66 0.66 0.71 1.02* 0.98 0.99  CALCIUM 9.5 9.3 9.3 9.5 9.7 9.4  MG 1.6* 1.9 2.1 2.1  --   --    GFR: Estimated Creatinine Clearance: 63.4 mL/min (by C-G formula based on SCr of 0.99 mg/dL). Liver Function Tests: No results for input(s): AST, ALT, ALKPHOS, BILITOT, PROT, ALBUMIN in the last 168 hours. No results for input(s): LIPASE, AMYLASE in the last 168 hours. No results for input(s): AMMONIA in the last 168 hours. Coagulation Profile: No results for input(s): INR, PROTIME in the last 168 hours. Cardiac Enzymes:  Recent Labs Lab 07/31/16 2116 08/01/16 0257 08/01/16 0858  TROPONINI <0.03 0.04* 0.05*   BNP (last  3 results) No results for input(s): PROBNP in the last 8760 hours. HbA1C: No results for input(s): HGBA1C in the last 72 hours. CBG:  Recent Labs Lab 08/05/16 0808 08/05/16 1319 08/05/16 1645 08/05/16 2145 08/06/16 0751  GLUCAP 202* 192* 161* 134* 229*   Lipid Profile: No results for input(s): CHOL, HDL, LDLCALC, TRIG, CHOLHDL, LDLDIRECT in the last 72 hours. Thyroid Function Tests: No results for input(s): TSH, T4TOTAL, FREET4, T3FREE, THYROIDAB in the last 72 hours. Anemia Panel: No results for input(s): VITAMINB12, FOLATE, FERRITIN, TIBC, IRON, RETICCTPCT in the last 72 hours. Urine analysis:    Component Value Date/Time   COLORURINE YELLOW 07/27/2015 1612   APPEARANCEUR CLEAR 07/27/2015 1612   LABSPEC 1.017 07/27/2015 1612   PHURINE 6.0 07/27/2015 1612   GLUCOSEU NEGATIVE 07/27/2015 1612   HGBUR 2+ (A) 07/27/2015 1612   BILIRUBINUR NEGATIVE 07/27/2015 1612    KETONESUR NEGATIVE 07/27/2015 1612   PROTEINUR NEGATIVE 07/27/2015 1612   UROBILINOGEN 1.0 10/23/2009 1145   NITRITE NEGATIVE 07/27/2015 1612   LEUKOCYTESUR NEGATIVE 07/27/2015 1612   Sepsis Labs: @LABRCNTIP (procalcitonin:4,lacticidven:4)  ) Recent Results (from the past 240 hour(s))  Blood culture (routine x 2)     Status: None   Collection Time: 07/31/16  9:07 PM  Result Value Ref Range Status   Specimen Description BLOOD RIGHT HAND  Final   Special Requests BOTTLES DRAWN AEROBIC AND ANAEROBIC 5CC  Final   Culture NO GROWTH 5 DAYS  Final   Report Status 08/05/2016 FINAL  Final  Culture, blood (routine x 2) Call MD if unable to obtain prior to antibiotics being given     Status: None   Collection Time: 07/31/16  9:08 PM  Result Value Ref Range Status   Specimen Description BLOOD LEFT HAND  Final   Special Requests BOTTLES DRAWN AEROBIC AND ANAEROBIC 5CC  Final   Culture NO GROWTH 5 DAYS  Final   Report Status 08/05/2016 FINAL  Final  Respiratory Panel by PCR     Status: None   Collection Time: 07/31/16 10:25 PM  Result Value Ref Range Status   Adenovirus NOT DETECTED NOT DETECTED Final   Coronavirus 229E NOT DETECTED NOT DETECTED Final   Coronavirus HKU1 NOT DETECTED NOT DETECTED Final   Coronavirus NL63 NOT DETECTED NOT DETECTED Final   Coronavirus OC43 NOT DETECTED NOT DETECTED Final   Metapneumovirus NOT DETECTED NOT DETECTED Final   Rhinovirus / Enterovirus NOT DETECTED NOT DETECTED Final   Influenza A NOT DETECTED NOT DETECTED Final   Influenza B NOT DETECTED NOT DETECTED Final   Parainfluenza Virus 1 NOT DETECTED NOT DETECTED Final   Parainfluenza Virus 2 NOT DETECTED NOT DETECTED Final   Parainfluenza Virus 3 NOT DETECTED NOT DETECTED Final   Parainfluenza Virus 4 NOT DETECTED NOT DETECTED Final   Respiratory Syncytial Virus NOT DETECTED NOT DETECTED Final   Bordetella pertussis NOT DETECTED NOT DETECTED Final   Chlamydophila pneumoniae NOT DETECTED NOT DETECTED  Final   Mycoplasma pneumoniae NOT DETECTED NOT DETECTED Final  MRSA PCR Screening     Status: None   Collection Time: 08/01/16  7:05 AM  Result Value Ref Range Status   MRSA by PCR NEGATIVE NEGATIVE Final    Comment:        The GeneXpert MRSA Assay (FDA approved for NASAL specimens only), is one component of a comprehensive MRSA colonization surveillance program. It is not intended to diagnose MRSA infection nor to guide or monitor treatment for MRSA infections.  Radiology Studies: No results found.      Scheduled Meds: . acetaZOLAMIDE  250 mg Oral TID  . apixaban  5 mg Oral BID  . cholecalciferol  1,000 Units Oral Daily  . diltiazem  90 mg Oral Q6H  . DULoxetine  60 mg Oral Daily  . famotidine  20 mg Oral BID  . guaiFENesin  600 mg Oral Daily  . insulin aspart  0-20 Units Subcutaneous TID WC  . insulin aspart  0-5 Units Subcutaneous QHS  . ipratropium  0.5 mg Nebulization Q6H  . levalbuterol  1.25 mg Nebulization Q6H  . levothyroxine  175 mcg Oral QAC breakfast  . metoprolol tartrate  25 mg Oral BID  . nicotine  14 mg Transdermal Daily  . polyethylene glycol  17 g Oral Daily  . pravastatin  40 mg Oral Daily  . predniSONE  40 mg Oral Q breakfast  . sodium chloride flush  3 mL Intravenous Q12H   Continuous Infusions:    LOS: 6 days    Time spent: 40 minutes    Jillian Wood, Geraldo Docker, MD [facility] Pager [phone]   If 7PM-7AM, please contact night-coverage [email or web address] Password [email or web address] 08/06/2016, 9:00 AM

## 2016-08-06 NOTE — Progress Notes (Signed)
Patient taken off of heated high flow nasal cannula and changed to [place] Cannula at 10 lpm.  No distress noted.  HR and SpO2 stable.  Reduced to 8lpm and observed.  Patient is comfortable, conversing in full sentences.  No change in heart rate or SpO2.  RT will continue to monitor.

## 2016-08-06 NOTE — Progress Notes (Signed)
Physical Therapy Treatment Patient Details Name: Jillian Wood MRN: [id number] DOB: April 24, 1946 Today's Date: 08/06/2016    History of Present Illness Pt admitted with acute on chronic respiratory failure with hypoxia requiring Bipap. She uses 4-6L 02 at home depending on activity level. PMH: COPD, tobacco abuse, CHF, obesity, DM, HTN, anxiety and depression.    PT Comments    Patient tolerated hallway ambulation with 4 standing rest breaks due to back pain and heavy reliance on RW for support (50 ft total) on high flow [place] 15 liters. Will continue to see as indicated.  Follow Up Recommendations  Home health PT;Supervision for mobility/OOB     Equipment Recommendations  None recommended by PT    Recommendations for Other Services       Precautions / Restrictions Precautions Precautions: Fall Precaution Comments: watch 02 sats Restrictions Weight Bearing Restrictions: No    Mobility  Bed Mobility Overal bed mobility: Needs Assistance Bed Mobility: Supine to Sit     Supine to sit: Min assist;HOB elevated     General bed mobility comments: used bedrails  Transfers Overall transfer level: Needs assistance Equipment used: 1 person hand held assist Transfers: Sit to/from Stand Sit to Stand: Min guard         General transfer comment: Min guard for stability when powering up to stand  Ambulation/Gait Ambulation/Gait assistance: Min guard Ambulation Distance (Feet): 50 Feet Assistive device: Rolling walker (2 wheeled) Gait Pattern/deviations: Step-through pattern;Decreased stride length;Shuffle;Trunk flexed;Wide base of support Gait velocity: decreased Gait velocity interpretation: <1.8 ft/sec, indicative of risk for recurrent falls General Gait Details: patient limited by activity tolerance re: back pain. Heavy reliance on RW for UE support. Ambulated on high flow [place] 15 liters with saturations > 90%   Stairs            Wheelchair Mobility    Modified  [place] (Stroke Patients Only)       Balance     Sitting balance-Leahy Scale: Good Sitting balance - Comments: tolerated EOB well     Standing balance-Leahy Scale: Poor Standing balance comment: heavy reliance on RW for UE support                    Cognition Arousal/Alertness: Awake/alert Behavior During Therapy: WFL for tasks assessed/performed Overall Cognitive Status: Within Functional Limits for tasks assessed                      Exercises      General Comments        Pertinent Vitals/Pain Pain Assessment: Faces Faces Pain Scale: Hurts little more Pain Location: back Pain Descriptors / Indicators: Aching Pain Intervention(s): Repositioned;Monitored during session    Home Living                      Prior Function            PT Goals (current goals can now be found in the care plan section) Acute Rehab PT Goals Patient Stated Goal: return home with her family PT Goal Formulation: With patient Time For Goal Achievement: 08/16/16 Potential to Achieve Goals: Good Progress towards PT goals: Progressing toward goals    Frequency    Min 3X/week      PT Plan Current plan remains appropriate    Co-evaluation             End of Session Equipment Utilized During Treatment: Gait belt;Oxygen Activity Tolerance: Patient limited by fatigue Patient  left: in chair;with call bell/phone within reach;with chair alarm set     Time: [phone] PT Time Calculation (min) (ACUTE ONLY): 20 min  Charges:  $Gait Training: 8-22 mins                    G Codes:      Duncan Dull 08-23-16, 1:55 PM Alben Deeds, [place] DPT  [phone]

## 2016-08-06 NOTE — Progress Notes (Signed)
SATURATION QUALIFICATIONS: (This note is used to comply with regulatory documentation for home oxygen)  Patient Saturations on Room Air at Rest = 61%  Patient Saturations on Room Air while Ambulating = N/A Desats on HFNC  Patient Saturations on 8 Liters of oxygen while Ambulating = 82% (stayed 85-88% most of the time)  Please briefly explain why patient needs home oxygen: Desats with ambulation with 8L O2

## 2016-08-07 ENCOUNTER — Other Ambulatory Visit (HOSPITAL_BASED_OUTPATIENT_CLINIC_OR_DEPARTMENT_OTHER): Payer: Self-pay

## 2016-08-07 ENCOUNTER — Ambulatory Visit: Payer: PPO

## 2016-08-07 DIAGNOSIS — R5383 Other fatigue: Secondary | ICD-10-CM

## 2016-08-07 DIAGNOSIS — J9621 Acute and chronic respiratory failure with hypoxia: Secondary | ICD-10-CM | POA: Diagnosis not present

## 2016-08-07 DIAGNOSIS — I420 Dilated cardiomyopathy: Secondary | ICD-10-CM

## 2016-08-07 DIAGNOSIS — I4891 Unspecified atrial fibrillation: Secondary | ICD-10-CM | POA: Diagnosis not present

## 2016-08-07 DIAGNOSIS — G4730 Sleep apnea, unspecified: Secondary | ICD-10-CM

## 2016-08-07 DIAGNOSIS — I5033 Acute on chronic diastolic (congestive) heart failure: Secondary | ICD-10-CM | POA: Diagnosis not present

## 2016-08-07 DIAGNOSIS — G4710 Hypersomnia, unspecified: Secondary | ICD-10-CM

## 2016-08-07 DIAGNOSIS — J441 Chronic obstructive pulmonary disease with (acute) exacerbation: Secondary | ICD-10-CM | POA: Diagnosis not present

## 2016-08-07 LAB — GLUCOSE, CAPILLARY
GLUCOSE-CAPILLARY: 122 mg/dL — AB (ref 65–99)
GLUCOSE-CAPILLARY: 193 mg/dL — AB (ref 65–99)
Glucose-Capillary: 204 mg/dL — ABNORMAL HIGH (ref 65–99)
Glucose-Capillary: 254 mg/dL — ABNORMAL HIGH (ref 65–99)

## 2016-08-07 MED ORDER — WARFARIN - PHARMACIST DOSING INPATIENT
Freq: Every day | Status: DC
  Administered 2016-08-07 – 2016-08-08 (×2)

## 2016-08-07 MED ORDER — ENOXAPARIN SODIUM 120 MG/0.8ML ~~LOC~~ SOLN
110.0000 mg | Freq: Two times a day (BID) | SUBCUTANEOUS | Status: DC
  Administered 2016-08-07 – 2016-08-11 (×8): 110 mg via SUBCUTANEOUS
  Filled 2016-08-07 (×8): qty 0.8

## 2016-08-07 MED ORDER — WARFARIN SODIUM 5 MG PO TABS
5.0000 mg | ORAL_TABLET | Freq: Once | ORAL | Status: AC
  Administered 2016-08-07: 5 mg via ORAL
  Filled 2016-08-07: qty 1

## 2016-08-07 NOTE — Progress Notes (Signed)
Placed patient on BIPAP for the night. Patient is tolerating it well at this time.

## 2016-08-07 NOTE — Progress Notes (Signed)
Patient is tolerating high flow nasal cannula well at this time. BIPAP not placed on at this time.

## 2016-08-07 NOTE — Progress Notes (Signed)
Ambulatory SPO2 SATURATION QUALIFICATIONS: (Thisnote is usedto comply with regulatory documentation for home oxygen) Patient Saturations on Room Air at Rest = 61% Patient Saturations on Room Air while Ambulating = N/A Desats on HFNC Patient Saturations on 8Liters of oxygen while Ambulating = 82%(stayed 85-88% most of the time) Please briefly explain why patient needs home oxygen:Desats with ambulation with 8L O2  NIV request: Patient has COPD and has been tried on CIPAP and BiPAP while hospitalized, and failed both modalities. Requesting patient be placed on NIV therapy for home use in order to prevent rehospitalization, and death.

## 2016-08-07 NOTE — Care Management Note (Signed)
Case Management Note  Patient Details  Name: Jillian Wood MRN: [id number] Date of Birth: [birth year]  Subjective/Objective:  Pt to d/c with nocturnal [facility] or astral NIV. Ronnell [place], representative with [facility], will submit clinicals to [facility].  Pt will also need HFNC O2 - will need order faxed to [facility] when discharged.  Per previous CM, pt refused home health services.                        Discharge planning Services  CM Consult  Choice offered to:  Patient  DME Arranged:   trilogy or astral NIV DME Agency:  [facility]  HH Arranged:  Patient Refused [facility] [place] Agency:  NA  Status of Service:  Completed, signed off  Girard Cooter, [place] 08/07/2016, 2:27 PM

## 2016-08-07 NOTE — Progress Notes (Signed)
PROGRESS NOTE    Jillian Wood  [id number] DOB: 29-May-1946 DOA: 07/31/2016 PCP: Alesia Richards, MD   Brief Narrative:  70 y.o. WF PMHx Depression, Anxiety HTN, PSVT, HLD,Diabetes mellitus type 2 in obese uncontrolled with complications, COPD, on 4 L oxygen, OSA, GERD, Hypothyroidism, Tobacco abuse, dCHF,   Who presents with shortness of breath.Patient states that she has been having worsening shortness of breath for 4 days. She has dry cough, no chest pain, fever or chills. She has mild runny nose, no sore throat. She has to increase oxygen use from 4 L to 8 L. She speaks in full sentence. Patient denies nausea, vomiting, abdominal pain, diarrhea, symptoms of UTI or unilateral weakness. She has mild leg edema bilaterally. No tenderness over calf areas.    Subjective: 11/22  A/Ox4, positive acute on chronic SOB, negative CP. Jillian Wood States feels significantly improved since admission. Sitting in chair comfortably. Patient now on 5 L O2 via [place]. States unable to afford the co-pay for Eliquis. Believes she already has a high flow regulator on her O2 tanks at home.     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)   Hypothyroidism   Paroxysmal SVT (supraventricular tachycardia) (HCC)   Tobacco abuse   Acute on chronic diastolic CHF (congestive heart failure) (HCC)   GERD (gastroesophageal reflux disease)   COPD with acute exacerbation (HCC)   Atrial fibrillation with RVR (HCC)   Diabetes mellitus type 2 in obese ([place])   Dilated cardiomyopathy (Jillian Wood)   Pulmonary hypertension   Acute on chronic respiratory failure with hypoxia:  -Multifactorial to include COPD exacerbation, tobacco abuse, OSA noncompliant with CPAP, and CHF  -Completed 5 days of antibiotics -Ipratropium nebulizer QID -Xopenex QID -Prednisone 40 mg  daily -Flutter valve -BiPAP at night per respiratory therapy . -Patient now down to 5 L O2 via [place] at rest and maintaining SPO2> 88% during the day -Patient with schedule Sleep Study on 14 December  COPD exacerbation: -See respiratory failure -Urine S. pneumococcal antigen -Blood culture x2, sputum culture, respiratory virus panel, Flu pcr negative  Ambulatory SPO2 SATURATION QUALIFICATIONS: (This note is used to comply with regulatory documentation for home oxygen) Patient Saturations on Room Air at Rest = 61% Patient Saturations on Room Air while Ambulating = N/A Desats on HFNC Patient Saturations on 8 Liters of oxygen while Ambulating = 82% (stayed 85-88% most of the time) Please briefly explain why patient needs home oxygen: [place] with ambulation with 8L O2  Acute on chronic Diastolic CHF (congestive heart failure)/Cardiomyopathy --strict I/O's since admission  - 2.4 L -Daily weight (unknown base weight) Filed Weights   08/05/16 0225 08/06/16 0300 08/07/16 0500  Weight: 110.2 kg (243 lb) 108 kg (238 lb) 112 kg (247 lb)  -Acetazolamide 250 mg TID -Cardizem 90 mg QID -Metoprolol 25 mg BID -Echocardiogram; cardiomyopathy see is below  -Has not seen cardiology Dr. Donnella Wood since March 2016. If patient remains in hospital will arrange for in hospital visit vs outpatient visit  HTN: -See CHF  New onset Atrial fibrillation with RVR (Jillian Wood): (CHA2DS2-VASc Score is 4,) -likely triggered by CHF exacerbation and hypoxia.  -Eliquis 5 mg BID;11/22 discontinue secondary to patient being unable to afford co-pay. Start Coumadin per pharmacy with Lovenox bridge  Pulmonary Hypertension -See CHF  HLD:  -Last LDL was 68 on 06/06/16 -  Pravastatin 20 mg daily  Hypothyroidism:  -Last TSH was 6.25 on 06/06/16: TSH on 11/16 WNL -Synthroid 175 g daily  GERD: -Pepcid   DVT prophylaxis: Eliquis-->Lovenox + Coumadin Code Status: Full Family Communication: None Disposition Plan:  Home   Consultants:  [facility]  Procedures/Significant Events:  11/17 Echocardiogram:Left ventricle: moderate LVH.-LVEF=  65% to 70%. -- Left atrium:  severely dilated. - Right atrium: mildly dilated. - Pulmonary arteries: PA peak pressure: 70 mm Hg (S).   VENTILATOR SETTINGS: Nocturnal BiPAP IPAP: 16 EPAP: 8 Percent oxygen: 10   Cultures 11/15 blood 2 negative 11/15 respiratory virus panel negative 11/16 MRSA by PCR negative 11/16 strep pneumo urine antigen negative  Antimicrobials: Azithromycin 11/15>> 11/19   Devices    LINES / TUBES:      Continuous Infusions:    Objective: Vitals:   08/07/16 0351 08/07/16 0500 08/07/16 0637 08/07/16 0741  BP: 133/85  127/87 127/87  Pulse:    77  Resp:    15  Temp:      TempSrc:      SpO2:    96%  Weight:  112 kg (247 lb)    Height:       No intake or output data in the 24 hours ending 08/07/16 0800 Filed Weights   08/05/16 0225 08/06/16 0300 08/07/16 0500  Weight: 110.2 kg (243 lb) 108 kg (238 lb) 112 kg (247 lb)    Examination:  General: A/O 4, positive acute on chronic respiratory distress Eyes: negative scleral hemorrhage, negative anisocoria, negative icterus ENT: Negative Runny nose, negative gingival bleeding, Neck:  Negative scars, masses, torticollis, lymphadenopathy, JVD Lungs: extremely poor air movement in all lung fields, without wheezes or crackles Cardiovascular: Irregular irregular rhythm and rate, without murmur gallop or rub normal S1 and S2 Abdomen: Morbidly obese, negative abdominal pain, nondistended, positive soft, bowel sounds, no rebound, no ascites, no appreciable mass Extremities: No significant cyanosis, clubbing, or edema bilateral lower extremities Skin: Negative rashes, lesions, ulcers Psychiatric:  Negative depression, negative anxiety, negative fatigue, negative mania  Central nervous system:  Cranial nerves II through XII intact, tongue/uvula midline, all extremities muscle  strength 5/5, sensation intact throughout,negative dysarthria, negative expressive aphasia, negative receptive aphasia.  .     Data Reviewed: Care during the described time interval was provided by me .  I have reviewed this patient's available data, including medical history, events of note, physical examination, and all test results as part of my evaluation. I have personally reviewed and interpreted all radiology studies.  CBC:  Recent Labs Lab 07/31/16 1805 08/03/16 0220 08/06/16 0300  WBC 5.8 6.7 8.0  HGB 10.6* 10.5* 10.6*  HCT 40.5 37.2 38.4  MCV 92.5 85.7 86.7  PLT 237 304 [id number]   Basic Metabolic Panel:  Recent Labs Lab 08/01/16 1544 08/02/16 0238 08/03/16 0220 08/04/16 0526 08/05/16 0228 08/06/16 0300  NA 141 140 140 138 137 135  K 4.6 4.4 3.9 3.4* 4.9 5.1  CL 85* 85* 85* 81* 84* 85*  CO2 40* 43* 41* 45* 44* 41*  GLUCOSE 94 157* 160* 167* 204* 197*  BUN 17 18 28* 37* 37* 37*  CREATININE 0.66 0.66 0.71 1.02* 0.98 0.99  CALCIUM 9.5 9.3 9.3 9.5 9.7 9.4  MG 1.6* 1.9 2.1 2.1  --   --    GFR: Estimated Creatinine Clearance: 64.8 mL/min (by C-G formula based on SCr of 0.99 mg/dL). Liver Function Tests: No results for input(s): AST, ALT, ALKPHOS, BILITOT, PROT, ALBUMIN in the last 168  hours. No results for input(s): LIPASE, AMYLASE in the last 168 hours. No results for input(s): AMMONIA in the last 168 hours. Coagulation Profile: No results for input(s): INR, PROTIME in the last 168 hours. Cardiac Enzymes:  Recent Labs Lab 07/31/16 2116 08/01/16 0257 08/01/16 0858  TROPONINI <0.03 0.04* 0.05*   BNP (last 3 results) No results for input(s): PROBNP in the last 8760 hours. HbA1C: No results for input(s): HGBA1C in the last 72 hours. CBG:  Recent Labs Lab 08/05/16 2145 08/06/16 0751 08/06/16 1232 08/06/16 1731 08/06/16 1943  GLUCAP 134* 229* 239* 210* 209*   Lipid Profile: No results for input(s): CHOL, HDL, LDLCALC, TRIG, CHOLHDL, LDLDIRECT in the  last 72 hours. Thyroid Function Tests: No results for input(s): TSH, T4TOTAL, FREET4, T3FREE, THYROIDAB in the last 72 hours. Anemia Panel: No results for input(s): VITAMINB12, FOLATE, FERRITIN, TIBC, IRON, RETICCTPCT in the last 72 hours. Urine analysis:    Component Value Date/Time   COLORURINE YELLOW 07/27/2015 1612   APPEARANCEUR CLEAR 07/27/2015 1612   LABSPEC 1.017 07/27/2015 1612   PHURINE 6.0 07/27/2015 1612   GLUCOSEU NEGATIVE 07/27/2015 1612   HGBUR 2+ (A) 07/27/2015 1612   BILIRUBINUR NEGATIVE 07/27/2015 1612   KETONESUR NEGATIVE 07/27/2015 1612   PROTEINUR NEGATIVE 07/27/2015 1612   UROBILINOGEN 1.0 10/23/2009 1145   NITRITE NEGATIVE 07/27/2015 1612   LEUKOCYTESUR NEGATIVE 07/27/2015 1612   Sepsis Labs: @LABRCNTIP (procalcitonin:4,lacticidven:4)  ) Recent Results (from the past 240 hour(s))  Blood culture (routine x 2)     Status: None   Collection Time: 07/31/16  9:07 PM  Result Value Ref Range Status   Specimen Description BLOOD RIGHT HAND  Final   Special Requests BOTTLES DRAWN AEROBIC AND ANAEROBIC 5CC  Final   Culture NO GROWTH 5 DAYS  Final   Report Status 08/05/2016 FINAL  Final  Culture, blood (routine x 2) Call MD if unable to obtain prior to antibiotics being given     Status: None   Collection Time: 07/31/16  9:08 PM  Result Value Ref Range Status   Specimen Description BLOOD LEFT HAND  Final   Special Requests BOTTLES DRAWN AEROBIC AND ANAEROBIC 5CC  Final   Culture NO GROWTH 5 DAYS  Final   Report Status 08/05/2016 FINAL  Final  Respiratory Panel by PCR     Status: None   Collection Time: 07/31/16 10:25 PM  Result Value Ref Range Status   Adenovirus NOT DETECTED NOT DETECTED Final   Coronavirus 229E NOT DETECTED NOT DETECTED Final   Coronavirus HKU1 NOT DETECTED NOT DETECTED Final   Coronavirus NL63 NOT DETECTED NOT DETECTED Final   Coronavirus OC43 NOT DETECTED NOT DETECTED Final   Metapneumovirus NOT DETECTED NOT DETECTED Final   Rhinovirus /  Enterovirus NOT DETECTED NOT DETECTED Final   Influenza A NOT DETECTED NOT DETECTED Final   Influenza B NOT DETECTED NOT DETECTED Final   Parainfluenza Virus 1 NOT DETECTED NOT DETECTED Final   Parainfluenza Virus 2 NOT DETECTED NOT DETECTED Final   Parainfluenza Virus 3 NOT DETECTED NOT DETECTED Final   Parainfluenza Virus 4 NOT DETECTED NOT DETECTED Final   Respiratory Syncytial Virus NOT DETECTED NOT DETECTED Final   Bordetella pertussis NOT DETECTED NOT DETECTED Final   Chlamydophila pneumoniae NOT DETECTED NOT DETECTED Final   Mycoplasma pneumoniae NOT DETECTED NOT DETECTED Final  MRSA PCR Screening     Status: None   Collection Time: 08/01/16  7:05 AM  Result Value Ref Range Status   MRSA by PCR  NEGATIVE NEGATIVE Final    Comment:        The GeneXpert MRSA Assay (FDA approved for NASAL specimens only), is one component of a comprehensive MRSA colonization surveillance program. It is not intended to diagnose MRSA infection nor to guide or monitor treatment for MRSA infections.          Radiology Studies: No results found.      Scheduled Meds: . acetaZOLAMIDE  250 mg Oral TID  . apixaban  5 mg Oral BID  . cholecalciferol  1,000 Units Oral Daily  . diltiazem  90 mg Oral Q6H  . DULoxetine  60 mg Oral Daily  . famotidine  20 mg Oral BID  . guaiFENesin  600 mg Oral Daily  . insulin aspart  0-20 Units Subcutaneous TID WC  . insulin aspart  0-5 Units Subcutaneous QHS  . ipratropium  0.5 mg Nebulization Q6H  . levalbuterol  1.25 mg Nebulization Q6H  . levothyroxine  175 mcg Oral QAC breakfast  . metoprolol tartrate  25 mg Oral BID  . nicotine  14 mg Transdermal Daily  . polyethylene glycol  17 g Oral Daily  . pravastatin  40 mg Oral Daily  . predniSONE  40 mg Oral Q breakfast  . sodium chloride flush  3 mL Intravenous Q12H   Continuous Infusions:    LOS: 7 days    Time spent: 40 minutes    WOODS, Geraldo Docker, MD [facility] Pager [phone]    If 7PM-7AM, please contact night-coverage [email or web address] Password [email or web address] 08/07/2016, 8:00 AM

## 2016-08-07 NOTE — Progress Notes (Signed)
ANTICOAGULATION CONSULT NOTE - Follow Up Consult  Pharmacy Consult for warfarin Indication: atrial fibrillation  Allergies  Allergen Reactions  . Inderal [Propranolol] Other (See Comments)    Hair loss  . Ace Inhibitors Cough    Patient Measurements: Height: 5\' 4"  (162.6 cm) Weight: 247 lb (112 kg) IBW/kg (Calculated) : 54.7  Vital Signs: Temp: 97.9 F (36.6 C) (11/22 0741) Temp Source: Axillary (11/22 0741) BP: 127/87 (11/22 0741) Pulse Rate: 77 (11/22 0741)  Labs:  Recent Labs  08/05/16 0228 08/06/16 0300  HGB  --  10.6*  HCT  --  38.4  PLT  --  290  CREATININE 0.98 0.99    Estimated Creatinine Clearance: 64.8 mL/min (by C-G formula based on SCr of 0.99 mg/dL).   Assessment: Jillian Wood is a 70 year old female admitted 07/31/2016 with new onset atrial fibrillation. She was initiated on Eliquis but is unable to afford the co-pay. Therefore we will transition her to warfarin with a Lovenox bridge.  Hgb 10.6 stable, Plt 290, No s/sx of bleeding noted. Last dose of Eliquis was 11/22 at 1017.  Goal of Therapy:  INR 2-3 Monitor platelets by anticoagulation protocol: Yes   Plan:  - Discontinue apixaban - Start warfarin 5 mg x1 tonight 1800 - Lovenox 110 mg (~1 mg/kg) SQ q12h - Monitor daily INR, CBC and s/sx of bleeding  Dimitri Ped, PharmD. PGY-2 Infectious Diseases Pharmacy Resident Pager: [phone] 08/07/2016,1:37 PM

## 2016-08-07 NOTE — Care Management Important Message (Signed)
Important Message  Patient Details  Name: Jillian Wood MRN: [id number] Date of Birth: 1946/06/12   Medicare Important Message Given:  Yes    Nathen May 08/07/2016, 12:55 PM

## 2016-08-08 DIAGNOSIS — I4891 Unspecified atrial fibrillation: Secondary | ICD-10-CM | POA: Diagnosis not present

## 2016-08-08 DIAGNOSIS — J441 Chronic obstructive pulmonary disease with (acute) exacerbation: Secondary | ICD-10-CM | POA: Diagnosis not present

## 2016-08-08 DIAGNOSIS — I5033 Acute on chronic diastolic (congestive) heart failure: Secondary | ICD-10-CM | POA: Diagnosis not present

## 2016-08-08 DIAGNOSIS — I420 Dilated cardiomyopathy: Secondary | ICD-10-CM | POA: Diagnosis not present

## 2016-08-08 DIAGNOSIS — J9621 Acute and chronic respiratory failure with hypoxia: Secondary | ICD-10-CM | POA: Diagnosis not present

## 2016-08-08 LAB — PROTIME-INR
INR: 1.07
Prothrombin Time: 14 seconds (ref 11.4–15.2)

## 2016-08-08 LAB — CBC
HEMATOCRIT: 39 % (ref 36.0–46.0)
HEMOGLOBIN: 10.7 g/dL — AB (ref 12.0–15.0)
MCH: 23.6 pg — ABNORMAL LOW (ref 26.0–34.0)
MCHC: 27.4 g/dL — AB (ref 30.0–36.0)
MCV: 86.1 fL (ref 78.0–100.0)
Platelets: 287 10*3/uL (ref 150–400)
RBC: 4.53 MIL/uL (ref 3.87–5.11)
RDW: 15.6 % — ABNORMAL HIGH (ref 11.5–15.5)
WBC: 7.5 10*3/uL (ref 4.0–10.5)

## 2016-08-08 LAB — GLUCOSE, CAPILLARY
GLUCOSE-CAPILLARY: 172 mg/dL — AB (ref 65–99)
GLUCOSE-CAPILLARY: 120 mg/dL — AB (ref 65–99)
Glucose-Capillary: 71 mg/dL (ref 65–99)
Glucose-Capillary: 396 mg/dL — ABNORMAL HIGH (ref 65–99)

## 2016-08-08 MED ORDER — INSULIN GLARGINE 100 UNIT/ML ~~LOC~~ SOLN
8.0000 [IU] | Freq: Every day | SUBCUTANEOUS | Status: DC
  Administered 2016-08-08 – 2016-08-11 (×4): 8 [IU] via SUBCUTANEOUS
  Filled 2016-08-08 (×4): qty 0.08

## 2016-08-08 MED ORDER — DILTIAZEM HCL ER COATED BEADS 180 MG PO CP24
360.0000 mg | ORAL_CAPSULE | Freq: Every day | ORAL | Status: DC
  Administered 2016-08-08 – 2016-08-11 (×4): 360 mg via ORAL
  Filled 2016-08-08 (×4): qty 2

## 2016-08-08 MED ORDER — WARFARIN SODIUM 5 MG PO TABS
5.0000 mg | ORAL_TABLET | Freq: Once | ORAL | Status: AC
  Administered 2016-08-08: 5 mg via ORAL
  Filled 2016-08-08: qty 1

## 2016-08-08 NOTE — Progress Notes (Signed)
PROGRESS NOTE    Jillian Wood  [id number] DOB: Apr 01, 1946 DOA: 07/31/2016 PCP: Alesia Richards, MD   Brief Narrative:  70 y.o. WF PMHx Depression, Anxiety HTN, PSVT, HLD,Diabetes mellitus type 2 in obese uncontrolled with complications, COPD, on 4 L oxygen, OSA, GERD, Hypothyroidism, Tobacco abuse, dCHF,   Who presents with shortness of breath.Patient states that she has been having worsening shortness of breath for 4 days. She has dry cough, no chest pain, fever or chills. She has mild runny nose, no sore throat. She has to increase oxygen use from 4 L to 8 L. She speaks in full sentence. Patient denies nausea, vomiting, abdominal pain, diarrhea, symptoms of UTI or unilateral weakness. She has mild leg edema bilaterally. No tenderness over calf areas.    Subjective: 11/23      A/Ox4, positive acute on chronic SOB, negative CP. Marland Kitchen States feels significantly improved since admission. Sitting in chair comfortably. Patient now on 5 L O2 via [place]. States unable to afford the co-pay for Eliquis. Believes she already has a high flow regulator on her O2 tanks at home.     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)   Hypothyroidism   Paroxysmal SVT (supraventricular tachycardia) (HCC)   Tobacco abuse   Acute on chronic diastolic CHF (congestive heart failure) (HCC)   GERD (gastroesophageal reflux disease)   COPD with acute exacerbation (HCC)   Atrial fibrillation with RVR (HCC)   Diabetes mellitus type 2 in obese ([place])   Dilated cardiomyopathy ([place])   Pulmonary hypertension   Acute on chronic respiratory failure with hypoxia:  -Multifactorial to include COPD exacerbation, tobacco abuse, OSA noncompliant with CPAP, and CHF  -Completed 5 days of antibiotics -Ipratropium nebulizer QID -Xopenex QID -Prednisone 40 mg  daily -Flutter valve -BiPAP at night: Insurance refused to pay for patient to have BiPAP until sleep study completed. -Arrange for Astral NIV device. See settings below, as well as BiPAP settings just during hospitalization. -Patient now down to 5 L O2 via [place] at rest and maintaining SPO2> 88% during the day -Patient with schedule Sleep Study on 14 December  COPD exacerbation: -See respiratory failure -Blood culture x2, sputum culture, respiratory virus panel, Flu pcr negative  Ambulatory SPO2 SATURATION QUALIFICATIONS: (This note is used to comply with regulatory documentation for home oxygen) Patient Saturations on Room Air at Rest = 61% Patient Saturations on Room Air while Ambulating = N/A Desats on HFNC Patient Saturations on 8 Liters of oxygen while Ambulating = 82% (stayed 85-88% most of the time) Please briefly explain why patient needs home oxygen: [place] with ambulation with 8L O2  Acute on chronic Diastolic CHF (congestive heart failure)/Cardiomyopathy --strict I/O's since admission  - 4.7 L -Daily weight (unknown base weight) Filed Weights   08/06/16 0300 08/07/16 0500 08/08/16 0313  Weight: 108 kg (238 lb) 112 kg (247 lb) 111.7 kg (246 lb 4.1 oz)  -Acetazolamide 250 mg TID -Cardizem 360 mg daily -Metoprolol 25 mg BID -Echocardiogram; cardiomyopathy see is below  -Patient to follow-up with cardiology in 3 weeks after being therapeutic on Coumadin for consideration cardioversion if still in A. fib/flutter.   HTN: -See CHF  New onset Atrial fibrillation with RVR ([place]): (CHA2DS2-VASc Score is 4,) -likely triggered by CHF exacerbation and hypoxia.  -Eliquis 5 mg BID;11/22 discontinue secondary to patient being unable  to afford co-pay.  -Start Coumadin per pharmacy with Lovenox bridge Recent Labs Lab 08/08/16 0300  INR 1.07   Pulmonary Hypertension -See CHF  HLD:  -Last LDL was 68 on 06/06/16 - Pravastatin 20 mg daily  Hypothyroidism:  -Last TSH was 6.25 on  06/06/16: TSH on 11/16 WNL -Synthroid 175 g daily  Diabetes mellitus type 2 controlled with complications -0000000 Hemoglobin A1c= 5.2 -Patient hyperglycemic secondary to steroid, start Lantus 8 units daily -Resistant SSI  GERD: -Pepcid   DVT prophylaxis: Eliquis-->Lovenox + Coumadin Code Status: Full Family Communication: None Disposition Plan: Home in 24-48 hrs.   Consultants:  [facility] [place] [facility]  Procedures/Significant Events:  11/17 Echocardiogram:Left ventricle: moderate LVH.-LVEF=  65% to 70%. -- Left atrium:  severely dilated. - Right atrium: mildly dilated. - Pulmonary arteries: PA peak pressure: 70 mm Hg (S).   VENTILATOR SETTINGS: Nocturnal BiPAP IPAP: 16 EPAP: 8 Percent oxygen: 10  Nocturnal Astral NIV device Pr: max 35 PS: Min 8    PS Max: 30 EPAP min: 4  EPAP max: 80 VtVol: 470 Rate: Auto   Cultures 11/15 blood 2 negative 11/15 respiratory virus panel negative 11/16 MRSA by PCR negative 11/16 strep pneumo urine antigen negative  Antimicrobials: Azithromycin 11/15>> 11/19   Devices    LINES / TUBES:      Continuous Infusions:    Objective: Vitals:   08/08/16 0905 08/08/16 0927 08/08/16 1205 08/08/16 1458  BP:   96/69   Pulse: 75  77   Resp:   15   Temp:   98.4 F (36.9 C)   TempSrc:   Oral   SpO2:  95% 95% 91%  Weight:      Height:        Intake/Output Summary (Last 24 hours) at 08/08/16 1640 Last data filed at 08/08/16 1500  Gross per 24 hour  Intake              363 ml  Output             2700 ml  Net            -2337 ml   Filed Weights   08/06/16 0300 08/07/16 0500 08/08/16 0313  Weight: 108 kg (238 lb) 112 kg (247 lb) 111.7 kg (246 lb 4.1 oz)    Examination:  General: A/O 4, positive acute on chronic respiratory distress Eyes: negative scleral hemorrhage, negative anisocoria, negative icterus ENT: Negative Runny nose, negative gingival bleeding, Neck:  Negative scars, masses, torticollis, lymphadenopathy, JVD Lungs:  extremely poor air movement in all lung fields, without wheezes or crackles Cardiovascular: Irregular irregular rhythm and rate, without murmur gallop or rub normal S1 and S2 Abdomen: Morbidly obese, negative abdominal pain, nondistended, positive soft, bowel sounds, no rebound, no ascites, no appreciable mass Extremities: No significant cyanosis, clubbing, or edema bilateral lower extremities Skin: Negative rashes, lesions, ulcers Psychiatric:  Negative depression, negative anxiety, negative fatigue, negative mania  Central nervous system:  Cranial nerves II through XII intact, tongue/uvula midline, all extremities muscle strength 5/5, sensation intact throughout,negative dysarthria, negative expressive aphasia, negative receptive aphasia.  .     Data Reviewed: Care during the described time interval was provided by me .  I have reviewed this patient's available data, including medical history, events of note, physical examination, and all test results as part of my evaluation. I have personally reviewed and interpreted all radiology studies.  CBC:  Recent Labs Lab 08/03/16 0220 08/06/16 0300 08/08/16 0300  WBC 6.7 8.0 7.5  HGB 10.5* 10.6* 10.7*  HCT 37.2 38.4 39.0  MCV 85.7 86.7 86.1  PLT 304 290 [id number]   Basic Metabolic Panel:  Recent Labs Lab 08/02/16 0238 08/03/16 0220 08/04/16 0526 08/05/16 0228 08/06/16 0300  NA 140 140 138 137 135  K 4.4 3.9 3.4* 4.9 5.1  CL 85* 85* 81* 84* 85*  CO2 43* 41* 45* 44* 41*  GLUCOSE 157* 160* 167* 204* 197*  BUN 18 28* 37* 37* 37*  CREATININE 0.66 0.71 1.02* 0.98 0.99  CALCIUM 9.3 9.3 9.5 9.7 9.4  MG 1.9 2.1 2.1  --   --    GFR: Estimated Creatinine Clearance: 64.7 mL/min (by C-G formula based on SCr of 0.99 mg/dL). Liver Function Tests: No results for input(s): AST, ALT, ALKPHOS, BILITOT, PROT, ALBUMIN in the last 168 hours. No results for input(s): LIPASE, AMYLASE in the last 168 hours. No results for input(s): AMMONIA in the last  168 hours. Coagulation Profile:  Recent Labs Lab 08/08/16 0300  INR 1.07   Cardiac Enzymes: No results for input(s): CKTOTAL, CKMB, CKMBINDEX, TROPONINI in the last 168 hours. BNP (last 3 results) No results for input(s): PROBNP in the last 8760 hours. HbA1C: No results for input(s): HGBA1C in the last 72 hours. CBG:  Recent Labs Lab 08/07/16 1232 08/07/16 1700 08/07/16 2156 08/08/16 0829 08/08/16 1204  GLUCAP 193* 204* 254* 71 172*   Lipid Profile: No results for input(s): CHOL, HDL, LDLCALC, TRIG, CHOLHDL, LDLDIRECT in the last 72 hours. Thyroid Function Tests: No results for input(s): TSH, T4TOTAL, FREET4, T3FREE, THYROIDAB in the last 72 hours. Anemia Panel: No results for input(s): VITAMINB12, FOLATE, FERRITIN, TIBC, IRON, RETICCTPCT in the last 72 hours. Urine analysis:    Component Value Date/Time   COLORURINE YELLOW 07/27/2015 1612   APPEARANCEUR CLEAR 07/27/2015 1612   LABSPEC 1.017 07/27/2015 1612   PHURINE 6.0 07/27/2015 1612   GLUCOSEU NEGATIVE 07/27/2015 1612   HGBUR 2+ (A) 07/27/2015 1612   BILIRUBINUR NEGATIVE 07/27/2015 1612   KETONESUR NEGATIVE 07/27/2015 1612   PROTEINUR NEGATIVE 07/27/2015 1612   UROBILINOGEN 1.0 10/23/2009 1145   NITRITE NEGATIVE 07/27/2015 1612   LEUKOCYTESUR NEGATIVE 07/27/2015 1612   Sepsis Labs: @LABRCNTIP (procalcitonin:4,lacticidven:4)  ) Recent Results (from the past 240 hour(s))  Blood culture (routine x 2)     Status: None   Collection Time: 07/31/16  9:07 PM  Result Value Ref Range Status   Specimen Description BLOOD RIGHT HAND  Final   Special Requests BOTTLES DRAWN AEROBIC AND ANAEROBIC 5CC  Final   Culture NO GROWTH 5 DAYS  Final   Report Status 08/05/2016 FINAL  Final  Culture, blood (routine x 2) Call MD if unable to obtain prior to antibiotics being given     Status: None   Collection Time: 07/31/16  9:08 PM  Result Value Ref Range Status   Specimen Description BLOOD LEFT HAND  Final   Special Requests  BOTTLES DRAWN AEROBIC AND ANAEROBIC 5CC  Final   Culture NO GROWTH 5 DAYS  Final   Report Status 08/05/2016 FINAL  Final  Respiratory Panel by PCR     Status: None   Collection Time: 07/31/16 10:25 PM  Result Value Ref Range Status   Adenovirus NOT DETECTED NOT DETECTED Final   Coronavirus 229E NOT DETECTED NOT DETECTED Final   Coronavirus HKU1 NOT DETECTED NOT DETECTED Final   Coronavirus NL63 NOT DETECTED NOT DETECTED Final   Coronavirus OC43 NOT DETECTED NOT DETECTED Final   Metapneumovirus NOT DETECTED NOT DETECTED  Final   Rhinovirus / Enterovirus NOT DETECTED NOT DETECTED Final   Influenza A NOT DETECTED NOT DETECTED Final   Influenza B NOT DETECTED NOT DETECTED Final   Parainfluenza Virus 1 NOT DETECTED NOT DETECTED Final   Parainfluenza Virus 2 NOT DETECTED NOT DETECTED Final   Parainfluenza Virus 3 NOT DETECTED NOT DETECTED Final   Parainfluenza Virus 4 NOT DETECTED NOT DETECTED Final   Respiratory Syncytial Virus NOT DETECTED NOT DETECTED Final   Bordetella pertussis NOT DETECTED NOT DETECTED Final   Chlamydophila pneumoniae NOT DETECTED NOT DETECTED Final   Mycoplasma pneumoniae NOT DETECTED NOT DETECTED Final  MRSA PCR Screening     Status: None   Collection Time: 08/01/16  7:05 AM  Result Value Ref Range Status   MRSA by PCR NEGATIVE NEGATIVE Final    Comment:        The GeneXpert MRSA Assay (FDA approved for NASAL specimens only), is one component of a comprehensive MRSA colonization surveillance program. It is not intended to diagnose MRSA infection nor to guide or monitor treatment for MRSA infections.          Radiology Studies: No results found.      Scheduled Meds: . acetaZOLAMIDE  250 mg Oral TID  . cholecalciferol  1,000 Units Oral Daily  . diltiazem  360 mg Oral Daily  . DULoxetine  60 mg Oral Daily  . enoxaparin (LOVENOX) injection  110 mg Subcutaneous Q12H  . famotidine  20 mg Oral BID  . guaiFENesin  600 mg Oral Daily  . insulin  aspart  0-20 Units Subcutaneous TID WC  . insulin aspart  0-5 Units Subcutaneous QHS  . ipratropium  0.5 mg Nebulization Q6H  . levalbuterol  1.25 mg Nebulization Q6H  . levothyroxine  175 mcg Oral QAC breakfast  . metoprolol tartrate  25 mg Oral BID  . nicotine  14 mg Transdermal Daily  . polyethylene glycol  17 g Oral Daily  . pravastatin  40 mg Oral Daily  . predniSONE  40 mg Oral Q breakfast  . sodium chloride flush  3 mL Intravenous Q12H  . warfarin  5 mg Oral ONCE-1800  . Warfarin - Pharmacist Dosing Inpatient   Does not apply q1800   Continuous Infusions:    LOS: 8 days    Time spent: 40 minutes    WOODS, Geraldo Docker, MD [facility] Pager [phone]   If 7PM-7AM, please contact night-coverage [email or web address] Password [email or web address] 08/08/2016, 4:40 PM

## 2016-08-08 NOTE — Progress Notes (Signed)
ANTICOAGULATION CONSULT NOTE - Follow Up Consult  Pharmacy Consult for warfarin Indication: atrial fibrillation  Allergies  Allergen Reactions  . Inderal [Propranolol] Other (See Comments)    Hair loss  . Ace Inhibitors Cough    Patient Measurements: Height: 5\' 4"  (162.6 cm) Weight: 246 lb 4.1 oz (111.7 kg) IBW/kg (Calculated) : 54.7  Vital Signs: Temp: 97.7 F (36.5 C) (11/23 0310) Temp Source: Oral (11/23 0310) BP: 103/67 (11/23 0310) Pulse Rate: 74 (11/23 0310)  Labs:  Recent Labs  08/06/16 0300 08/08/16 0300  HGB 10.6* 10.7*  HCT 38.4 39.0  PLT 290 287  LABPROT  --  14.0  INR  --  1.07  CREATININE 0.99  --     Estimated Creatinine Clearance: 64.7 mL/min (by C-G formula based on SCr of 0.99 mg/dL).   Assessment: Jillian Wood is a 70 year old female admitted 07/31/2016 with new onset atrial fibrillation. She was initiated on Eliquis but is unable to afford the co-pay. She was transitioned to warfarin with a Lovenox bridge.  Hgb 10.7 stable, Plt 287, No s/sx of bleeding noted. Last dose of Eliquis was 11/22 at 1017.  INR 1.07 after 1 dose of warfarin.  Goal of Therapy:  INR 2-3 Monitor platelets by anticoagulation protocol: Yes   Plan:  - Repeat warfarin 5 mg x1 tonight 1800 - Continue Lovenox 110 mg (~1 mg/kg) SQ q12h until INR > 2. - Monitor daily INR, CBC and s/sx of bleeding  Uvaldo Rising, BCPS  Clinical Pharmacist Pager [phone]  08/08/2016 7:59 AM

## 2016-08-08 NOTE — Progress Notes (Signed)
Patient Name: Jillian Wood Date of Encounter: 08/08/2016  Primary Cardiologist: Dr. Gwenlyn Found  Electrophysiologist: Dr. Thayer Ohm Problem List     Principal Problem:   Acute on chronic respiratory failure with hypoxia [facility]) Active Problems:   Essential hypertension   Hyperlipidemia   T2_NIDDM w/Stage 3 CKD (GFR 72 ml/min)   Hypothyroidism   Paroxysmal SVT (supraventricular tachycardia) (HCC)   Tobacco abuse   Acute on chronic diastolic CHF (congestive heart failure) (HCC)   GERD (gastroesophageal reflux disease)   COPD with acute exacerbation (HCC)   Atrial fibrillation with RVR ([place])   Diabetes mellitus type 2 in obese ([place])   Dilated cardiomyopathy ([place])   Pulmonary hypertension     Subjective   Admitted with shortness of breath. Overall feeling better. No chest pain  Inpatient Medications    Scheduled Meds: . acetaZOLAMIDE  250 mg Oral TID  . cholecalciferol  1,000 Units Oral Daily  . diltiazem  90 mg Oral Q6H  . DULoxetine  60 mg Oral Daily  . enoxaparin (LOVENOX) injection  110 mg Subcutaneous Q12H  . famotidine  20 mg Oral BID  . guaiFENesin  600 mg Oral Daily  . insulin aspart  0-20 Units Subcutaneous TID WC  . insulin aspart  0-5 Units Subcutaneous QHS  . ipratropium  0.5 mg Nebulization Q6H  . levalbuterol  1.25 mg Nebulization Q6H  . levothyroxine  175 mcg Oral QAC breakfast  . metoprolol tartrate  25 mg Oral BID  . nicotine  14 mg Transdermal Daily  . polyethylene glycol  17 g Oral Daily  . pravastatin  40 mg Oral Daily  . predniSONE  40 mg Oral Q breakfast  . sodium chloride flush  3 mL Intravenous Q12H  . Warfarin - Pharmacist Dosing Inpatient   Does not apply q1800   Continuous Infusions:  PRN Meds: acetaminophen, acetaminophen-codeine, diazepam, ondansetron, zolpidem   Vital Signs    Vitals:   08/07/16 2258 08/07/16 2350 08/08/16 0310 08/08/16 0313  BP:  121/76 103/67   Pulse:  74 74   Resp:      Temp:  97.9 F (36.6 C)  97.7 F (36.5 C)   TempSrc:  Axillary Oral   SpO2: 93% 96% 90%   Weight:    246 lb 4.1 oz (111.7 kg)  Height:        Intake/Output Summary (Last 24 hours) at 08/08/16 0755 Last data filed at 08/08/16 0314  Gross per 24 hour  Intake             1080 ml  Output             1950 ml  Net             -870 ml   Filed Weights   08/06/16 0300 08/07/16 0500 08/08/16 0313  Weight: 238 lb (108 kg) 247 lb (112 kg) 246 lb 4.1 oz (111.7 kg)    Physical Exam    GEN: Well nourished, well developed, in no acute distress.  HEENT: Grossly normal.  Neck: Supple, no JVD, carotid bruits, or masses. Cardiac: Irregularly irregular, no murmurs, rubs, or gallops. No clubbing, cyanosis, edema.  Radials/DP/PT 2+ and equal bilaterally.  Respiratory:  Respirations regular and unlabored, clear to auscultation bilaterally. GI: Soft, nontender, nondistended, BS + x 4. MS: no deformity or atrophy. Skin: warm and dry, no rash. Neuro:  Strength and sensation are intact. Psych: AAOx3.  Normal affect.  Labs    CBC  Recent  Labs  08/06/16 0300 08/08/16 0300  WBC 8.0 7.5  HGB 10.6* 10.7*  HCT 38.4 39.0  MCV 86.7 86.1  PLT 290 [id number]   Basic Metabolic Panel  Recent Labs  08/06/16 0300  NA 135  K 5.1  CL 85*  CO2 41*  GLUCOSE 197*  BUN 37*  CREATININE 0.99  CALCIUM 9.4     Telemetry     - At 9:36 AM on 08/07/16-2.6 second pause noted. - Personally Reviewed  ECG    On admission showed atrial fibs/flutter with rapid ventricular response, right bundle branch block - Personally Reviewed  Radiology    No results found.  Cardiac Studies   Echocardiogram 08/02/16: - Left ventricle: The cavity size was moderately reduced. Wall   thickness was increased in a pattern of moderate LVH. The   estimated ejection fraction was in the range of 65% to 70%. Wall   motion was normal; there were no regional wall motion   abnormalities. - Aortic valve: Severely calcified annulus. Moderately  thickened,   moderately calcified leaflets. There was mild stenosis. Valve   area (VTI): 1.05 cm^2. Valve area (Vmax): 1.09 cm^2. Valve area   (Vmean): 1.05 cm^2. - Mitral valve: Moderately calcified annulus. - Left atrium: The atrium was severely dilated. - Right atrium: The atrium was mildly dilated. - Pulmonary arteries: Systolic pressure was severely increased. PA   peak pressure: 70 mm Hg (S).  Patient Profile     70 year old female with acute on chronic diastolic heart failure, hypoxic respiratory failure, COPD, home O2, anticoagulation improved  Assessment & Plan    Acute on chronic diastolic heart failure  - Good overall diuresis--4 L total  - Continuing to monitor I's and O's  COPD exacerbation  - Solu-Medrol was given, nebulizers, Mucinex per primary team  Acute on chronic respiratory failure with hypoxia  - Home oxygen, previous BiPAP in emergency department.  - Treating for a combination of pulmonary as well as cardiovascular etiology.  Hypertensive heart disease with heart failure  - Losartan, Lasix, diltiazem  Atrial fibrillation/flutter with rapid ventricular response  - Improved.  - Score-5-warfarin  - At 9:36 AM on 08/07/16-2.6 second pause noted.  - She is on both diltiazem as well as low-dose metoprolol. Continue. We'll consolidate diltiazem to CD. 360 mg.  - In approximately 3 weeks after being therapeutic on Coumadin, if she remains in atrial flutter which is likely, we could consider cardioversion.  Pulmonary hypertension, secondary  - Likely from long-standing elevated left-sided heart pressures as well as underlying respiratory condition.  Please let us know if we can be of further assistance, we will sign off.  Signed, Candee Furbish, MD  08/08/2016, 7:55 AM

## 2016-08-09 DIAGNOSIS — J441 Chronic obstructive pulmonary disease with (acute) exacerbation: Secondary | ICD-10-CM | POA: Diagnosis not present

## 2016-08-09 DIAGNOSIS — E1169 Type 2 diabetes mellitus with other specified complication: Secondary | ICD-10-CM | POA: Diagnosis not present

## 2016-08-09 DIAGNOSIS — I272 Pulmonary hypertension, unspecified: Secondary | ICD-10-CM

## 2016-08-09 DIAGNOSIS — I5033 Acute on chronic diastolic (congestive) heart failure: Secondary | ICD-10-CM | POA: Diagnosis not present

## 2016-08-09 DIAGNOSIS — J9621 Acute and chronic respiratory failure with hypoxia: Secondary | ICD-10-CM | POA: Diagnosis not present

## 2016-08-09 DIAGNOSIS — I4891 Unspecified atrial fibrillation: Secondary | ICD-10-CM | POA: Diagnosis not present

## 2016-08-09 LAB — GLUCOSE, CAPILLARY
GLUCOSE-CAPILLARY: 111 mg/dL — AB (ref 65–99)
Glucose-Capillary: 192 mg/dL — ABNORMAL HIGH (ref 65–99)
Glucose-Capillary: 391 mg/dL — ABNORMAL HIGH (ref 65–99)
Glucose-Capillary: 211 mg/dL — ABNORMAL HIGH (ref 65–99)

## 2016-08-09 LAB — CBC
HCT: 37.8 % (ref 36.0–46.0)
Hemoglobin: 10.4 g/dL — ABNORMAL LOW (ref 12.0–15.0)
MCH: 24 pg — AB (ref 26.0–34.0)
MCHC: 27.5 g/dL — AB (ref 30.0–36.0)
MCV: 87.1 fL (ref 78.0–100.0)
PLATELETS: 273 10*3/uL (ref 150–400)
RBC: 4.34 MIL/uL (ref 3.87–5.11)
RDW: 15.9 % — ABNORMAL HIGH (ref 11.5–15.5)
WBC: 8 10*3/uL (ref 4.0–10.5)

## 2016-08-09 LAB — PROTIME-INR
INR: 1.11
PROTHROMBIN TIME: 14.4 s (ref 11.4–15.2)

## 2016-08-09 MED ORDER — WARFARIN SODIUM 5 MG PO TABS: 5.0000 mg | ORAL_TABLET | Freq: Once | ORAL | Status: DC

## 2016-08-09 MED ORDER — ACETAZOLAMIDE 250 MG PO TABS
250.0000 mg | ORAL_TABLET | Freq: Three times a day (TID) | ORAL | Status: DC
  Administered 2016-08-09 – 2016-08-10 (×4): 250 mg via ORAL
  Filled 2016-08-09 (×5): qty 1

## 2016-08-09 MED ORDER — WARFARIN SODIUM 5 MG PO TABS
7.5000 mg | ORAL_TABLET | Freq: Once | ORAL | Status: AC
  Administered 2016-08-09: 7.5 mg via ORAL
  Filled 2016-08-09: qty 2

## 2016-08-09 NOTE — Plan of Care (Signed)
Problem: Safety: Goal: Ability to remain free from injury will improve Outcome: Progressing Discussed ambulation when at home how not to lean on tables but use walker in front of them with some teach back displayed

## 2016-08-09 NOTE — Progress Notes (Signed)
PROGRESS NOTE [facility]   Jillian Wood   [id number] DOB: Sep 15, 1946  DOA: 07/31/2016 PCP: Alesia Richards, MD   Brief Narrative:  70 y.o.WF PMHx Depression, Anxiety HTN, PSVT, HLD,Diabetes mellitus type 2 in obese uncontrolled with complications, COPD, oxygen dependent, OSA, GERD, Hypothyroidism, Tobacco abuse, dCHF, presented to the ED with worsening of SOB. Patient was admitted with COPD exacerbation with needs to incrase in O2 supplementation. Patient also noted to be in new onset Afib, started on a/c with warfarin and rate controlled medications.   Subjective: Patient seen and examined at bedside. Patient have no compliant today, report her breathing feels back to baseline. No acute events overnight. Remains afebrile. INR today 1.11. Still on 8L [place] sating 97%  Assessment & Plan: Acute on chronic respiratory failure with hypoxia: -  To be multifactorial COPD exacerbation, OSA noncompliant with CPAP and ? CHF Completed 5 days of antibiotics azithromycin. Continue nebulizers. Continue prednisone 40 mg daily Flutter valve Patient was placed in BiPAP at night, tolerated well. Patient back again on 8 liters nasal cannula. O2 requirement may be new baseline  Astral NIV device at bedside Wean O2 to keep saturations above 89%.  COPD exacerbation  See above   Acute on chronic diastolic CHF - Improving, weight down ~20lb  Continue current medications - Bb, Acetazolamide  Daily weights  Strict I&O's  HTN - stable  On BB and Cardizem   Afib - (new diagnosis) CHADSVASc Score 4 - rate controlled  On Warfarin INR 1.11 - will increase dose tonight to 7.5mg   - INR goal 2-3 Continue Cardizem and metoprolol  Monitor tele   Pulmonary HTN  See CHF   DM type 2- Controlled, A1C 5.2 Continue Lantus  SSI  Monitor CBG's   GERD Pepcid  DVT prophylaxis: Warfarin/Lovenox  Code Status: Full  Family Communication: None at bedside  Disposition Plan: Home when INR  close to 2   Consultants:   Cardiology   Pharmacy   PCCM   Procedures:   11/17 ECHO - LVH  - EF 65-70% Left atrium severely dilated, Right atrium mild dilation, Increase in pulmonary arteries pressures   Antimicrobials:  Azithromycin 11/15 - 11/19    Objective: Vitals:   08/09/16 0500 08/09/16 0600 08/09/16 0652 08/09/16 0841  BP:   126/69 110/71  Pulse: 74 (!) 127 81 77  Resp: 16 20 20 13   Temp:    98 F (36.7 C)  TempSrc:    Oral  SpO2: 100% 95% 93% 99%  Weight:      Height:        Intake/Output Summary (Last 24 hours) at 08/09/16 0913 Last data filed at 08/09/16 0500  Gross per 24 hour  Intake              250 ml  Output             1450 ml  Net            -1200 ml   Filed Weights   08/07/16 0500 08/08/16 0313 08/09/16 0243  Weight: 112 kg (247 lb) 111.7 kg (246 lb 4.1 oz) 107 kg (236 lb)    Examination:  General exam: Appears calm and comfortable  Respiratory system: Good air entry, mild expiratory wheezes  Cardiovascular system: Irr Irr, S1 & S2 heard, No JVD, murmurs, rubs or gallops Gastrointestinal system: Abdomen NT,ND soft  Central nervous system: Alert and oriented. No focal neurological deficits. Extremities: No pedal edema.  Skin: No rashes, lesions or  ulcers  Data Reviewed: I have personally reviewed following labs and imaging studies  CBC:  Recent Labs Lab 08/03/16 0220 08/06/16 0300 08/08/16 0300 08/09/16 0239  WBC 6.7 8.0 7.5 8.0  HGB 10.5* 10.6* 10.7* 10.4*  HCT 37.2 38.4 39.0 37.8  MCV 85.7 86.7 86.1 87.1  PLT 304 290 287 [id number]   Basic Metabolic Panel:  Recent Labs Lab 08/03/16 0220 08/04/16 0526 08/05/16 0228 08/06/16 0300  NA 140 138 137 135  K 3.9 3.4* 4.9 5.1  CL 85* 81* 84* 85*  CO2 41* 45* 44* 41*  GLUCOSE 160* 167* 204* 197*  BUN 28* 37* 37* 37*  CREATININE 0.71 1.02* 0.98 0.99  CALCIUM 9.3 9.5 9.7 9.4  MG 2.1 2.1  --   --    GFR: Estimated Creatinine Clearance: 63.1 mL/min (by C-G formula based on SCr  of 0.99 mg/dL). Liver Function Tests: No results for input(s): AST, ALT, ALKPHOS, BILITOT, PROT, ALBUMIN in the last 168 hours. No results for input(s): LIPASE, AMYLASE in the last 168 hours. No results for input(s): AMMONIA in the last 168 hours. Coagulation Profile:  Recent Labs Lab 08/08/16 0300 08/09/16 0239  INR 1.07 1.11   Cardiac Enzymes: No results for input(s): CKTOTAL, CKMB, CKMBINDEX, TROPONINI in the last 168 hours. BNP (last 3 results) No results for input(s): PROBNP in the last 8760 hours. HbA1C: No results for input(s): HGBA1C in the last 72 hours. CBG:  Recent Labs Lab 08/08/16 0829 08/08/16 1204 08/08/16 1719 08/08/16 2227 08/09/16 0855  GLUCAP 71 172* 396* 120* 111*   Lipid Profile: No results for input(s): CHOL, HDL, LDLCALC, TRIG, CHOLHDL, LDLDIRECT in the last 72 hours. Thyroid Function Tests: No results for input(s): TSH, T4TOTAL, FREET4, T3FREE, THYROIDAB in the last 72 hours. Anemia Panel: No results for input(s): VITAMINB12, FOLATE, FERRITIN, TIBC, IRON, RETICCTPCT in the last 72 hours. Sepsis Labs: No results for input(s): PROCALCITON, LATICACIDVEN in the last 168 hours.  Recent Results (from the past 240 hour(s))  Blood culture (routine x 2)     Status: None   Collection Time: 07/31/16  9:07 PM  Result Value Ref Range Status   Specimen Description BLOOD RIGHT HAND  Final   Special Requests BOTTLES DRAWN AEROBIC AND ANAEROBIC 5CC  Final   Culture NO GROWTH 5 DAYS  Final   Report Status 08/05/2016 FINAL  Final  Culture, blood (routine x 2) Call MD if unable to obtain prior to antibiotics being given     Status: None   Collection Time: 07/31/16  9:08 PM  Result Value Ref Range Status   Specimen Description BLOOD LEFT HAND  Final   Special Requests BOTTLES DRAWN AEROBIC AND ANAEROBIC 5CC  Final   Culture NO GROWTH 5 DAYS  Final   Report Status 08/05/2016 FINAL  Final  Respiratory Panel by PCR     Status: None   Collection Time: 07/31/16  10:25 PM  Result Value Ref Range Status   Adenovirus NOT DETECTED NOT DETECTED Final   Coronavirus 229E NOT DETECTED NOT DETECTED Final   Coronavirus HKU1 NOT DETECTED NOT DETECTED Final   Coronavirus NL63 NOT DETECTED NOT DETECTED Final   Coronavirus OC43 NOT DETECTED NOT DETECTED Final   Metapneumovirus NOT DETECTED NOT DETECTED Final   Rhinovirus / Enterovirus NOT DETECTED NOT DETECTED Final   Influenza A NOT DETECTED NOT DETECTED Final   Influenza B NOT DETECTED NOT DETECTED Final   Parainfluenza Virus 1 NOT DETECTED NOT DETECTED Final   Parainfluenza Virus 2  NOT DETECTED NOT DETECTED Final   Parainfluenza Virus 3 NOT DETECTED NOT DETECTED Final   Parainfluenza Virus 4 NOT DETECTED NOT DETECTED Final   Respiratory Syncytial Virus NOT DETECTED NOT DETECTED Final   Bordetella pertussis NOT DETECTED NOT DETECTED Final   Chlamydophila pneumoniae NOT DETECTED NOT DETECTED Final   Mycoplasma pneumoniae NOT DETECTED NOT DETECTED Final  MRSA PCR Screening     Status: None   Collection Time: 08/01/16  7:05 AM  Result Value Ref Range Status   MRSA by PCR NEGATIVE NEGATIVE Final    Comment:        The GeneXpert MRSA Assay (FDA approved for NASAL specimens only), is one component of a comprehensive MRSA colonization surveillance program. It is not intended to diagnose MRSA infection nor to guide or monitor treatment for MRSA infections.       Radiology Studies: No results found.   Scheduled Meds: . acetaZOLAMIDE  250 mg Oral TID  . cholecalciferol  1,000 Units Oral Daily  . diltiazem  360 mg Oral Daily  . DULoxetine  60 mg Oral Daily  . enoxaparin (LOVENOX) injection  110 mg Subcutaneous Q12H  . famotidine  20 mg Oral BID  . guaiFENesin  600 mg Oral Daily  . insulin aspart  0-20 Units Subcutaneous TID WC  . insulin aspart  0-5 Units Subcutaneous QHS  . insulin glargine  8 Units Subcutaneous Daily  . ipratropium  0.5 mg Nebulization Q6H  . levalbuterol  1.25 mg  Nebulization Q6H  . levothyroxine  175 mcg Oral QAC breakfast  . metoprolol tartrate  25 mg Oral BID  . nicotine  14 mg Transdermal Daily  . polyethylene glycol  17 g Oral Daily  . pravastatin  40 mg Oral Daily  . predniSONE  40 mg Oral Q breakfast  . sodium chloride flush  3 mL Intravenous Q12H  . warfarin  5 mg Oral ONCE-1800  . Warfarin - Pharmacist Dosing Inpatient   Does not apply q1800   Continuous Infusions:   LOS: 9 days    Chipper Oman, MD [facility] Pager [phone]  If 7PM-7AM, please contact night-coverage [email or web address] Password [facility] 08/09/2016, 9:13 AM

## 2016-08-09 NOTE — Progress Notes (Signed)
MD notified, patient wishes to change the times in which she take diamox while inpatient so that she can rest more peacefully at night. New orders received. Will continue to monitor patient closely.

## 2016-08-09 NOTE — Progress Notes (Signed)
Physical Therapy Treatment Patient Details Name: Jillian Wood MRN: [id number] DOB: May 10, 1946 Today's Date: 08/09/2016    History of Present Illness Pt admitted with acute on chronic respiratory failure with hypoxia requiring Bipap. She uses 4-6L 02 at home depending on activity level. PMH: COPD, tobacco abuse, CHF, obesity, DM, HTN, anxiety and depression.    PT Comments    Pt presented sitting OOB in recliner when PT entered room. Pt limited this session secondary to back pain (chronic). Pt also limited secondary to fatigue and SOB at end of ambulation. Pt ambulated 15' with RW and min guard on 6L of O2 via [place]. Pt's SPO2 maintained at 85-90% throughout session. Pt would continue to benefit from skilled physical therapy services at this time while admitted and after d/c to address her limitations in order to improve her overall safety and independence with functional mobility.   Follow Up Recommendations  Home health PT;Supervision for mobility/OOB     Equipment Recommendations  None recommended by PT    Recommendations for Other Services       Precautions / Restrictions Precautions Precautions: Fall Precaution Comments: watch 02 sats Restrictions Weight Bearing Restrictions: No    Mobility  Bed Mobility               General bed mobility comments: pt sitting OOB in recliner when PT entered room  Transfers Overall transfer level: Needs assistance Equipment used: Rolling walker (2 wheeled) Transfers: Sit to/from Stand Sit to Stand: Supervision         General transfer comment: pt required increased time, good hand placement, supervision for safety  Ambulation/Gait Ambulation/Gait assistance: Min guard Ambulation Distance (Feet): 50 Feet Assistive device: Rolling walker (2 wheeled) Gait Pattern/deviations: Step-through pattern;Decreased stride length;Trunk flexed Gait velocity: decreased Gait velocity interpretation: Below normal speed for  age/gender General Gait Details: pt limited secondary to pain in back, requiring multiple standing rest breaks during ambulation   Stairs            Wheelchair Mobility    Modified Rankin (Stroke Patients Only)       Balance Overall balance assessment: Needs assistance Sitting-balance support: Feet supported;No upper extremity supported Sitting balance-Leahy Scale: Good     Standing balance support: During functional activity;Single extremity supported Standing balance-Leahy Scale: Poor Standing balance comment: pt able to stand statically with single UE support on RW to adjust her gown                    Cognition Arousal/Alertness: Awake/alert Behavior During Therapy: WFL for tasks assessed/performed Overall Cognitive Status: Within Functional Limits for tasks assessed                      Exercises      General Comments        Pertinent Vitals/Pain Pain Assessment: Faces Faces Pain Scale: Hurts little more Pain Location: back Pain Descriptors / Indicators: Sore;Guarding Pain Intervention(s): Monitored during session;Repositioned;Limited activity within patient's tolerance    Home Living                      Prior Function            PT Goals (current goals can now be found in the care plan section) Acute Rehab PT Goals Patient Stated Goal: return home with her family PT Goal Formulation: With patient Time For Goal Achievement: 08/16/16 Potential to Achieve Goals: Good Progress towards PT goals: Progressing toward goals  Frequency    Min 3X/week      PT Plan Current plan remains appropriate    Co-evaluation             End of Session Equipment Utilized During Treatment: Gait belt;Oxygen (6L of O2 via [place]) Activity Tolerance: Patient limited by fatigue;Patient limited by pain Patient left: in chair;with call bell/phone within reach;with family/visitor present     Time: 1646-1710 PT Time Calculation (min)  (ACUTE ONLY): 24 min  Charges:  $Gait Training: 8-22 mins $Therapeutic Activity: 8-22 mins                    G CodesClearnce Sorrel Khaidyn Staebell 2016/09/05, [place], [place], DPT [phone]

## 2016-08-09 NOTE — Progress Notes (Addendum)
ANTICOAGULATION CONSULT NOTE - Follow Up Consult  Pharmacy Consult for warfarin/enoxaparin Indication: atrial fibrillation  Allergies  Allergen Reactions  . Inderal [Propranolol] Other (See Comments)    Hair loss  . Ace Inhibitors Cough    Patient Measurements: Height: 5\' 4"  (162.6 cm) Weight: 236 lb (107 kg) IBW/kg (Calculated) : 54.7  Vital Signs: Temp: 97.7 F (36.5 C) (11/24 0300) Temp Source: Axillary (11/24 0300) BP: 126/69 (11/24 0652) Pulse Rate: 81 (11/24 0652)  Labs:  Recent Labs  08/08/16 0300 08/09/16 0239  HGB 10.7* 10.4*  HCT 39.0 37.8  PLT 287 273  LABPROT 14.0 14.4  INR 1.07 1.11    Estimated Creatinine Clearance: 63.1 mL/min (by C-G formula based on SCr of 0.99 mg/dL).   Assessment: Jillian Wood is a 70 year old female admitted 07/31/2016 with new onset atrial fibrillation. She was initiated on Eliquis but is unable to afford the co-pay. Therefore was transitioned to warfarin with a Lovenox bridge on 11/22.  INR 1.11 (subtherapeutic), Hgb 10.4 stable, plts wnl, no signs/symptoms of bleeding noted.  Goal of Therapy:  INR 2-3 Monitor platelets by anticoagulation protocol: Yes   Plan:  - Warfarin 7.5 mg x1 tonight - Continue enoxaparin 110 mg SQ q12h until INR >2 - Monitor daily INR, CBC and signs/symptoms of bleeding  Dimitri Ped, PharmD. PGY-2 Infectious Diseases Pharmacy Resident Pager: [phone] 08/09/2016,7:31 AM

## 2016-08-10 DIAGNOSIS — J9621 Acute and chronic respiratory failure with hypoxia: Secondary | ICD-10-CM | POA: Diagnosis not present

## 2016-08-10 DIAGNOSIS — E1169 Type 2 diabetes mellitus with other specified complication: Secondary | ICD-10-CM | POA: Diagnosis not present

## 2016-08-10 DIAGNOSIS — J441 Chronic obstructive pulmonary disease with (acute) exacerbation: Secondary | ICD-10-CM | POA: Diagnosis not present

## 2016-08-10 DIAGNOSIS — I5033 Acute on chronic diastolic (congestive) heart failure: Secondary | ICD-10-CM | POA: Diagnosis not present

## 2016-08-10 DIAGNOSIS — I4891 Unspecified atrial fibrillation: Secondary | ICD-10-CM | POA: Diagnosis not present

## 2016-08-10 LAB — PROTIME-INR
INR: 1.43
PROTHROMBIN TIME: 17.6 s — AB (ref 11.4–15.2)

## 2016-08-10 LAB — GLUCOSE, CAPILLARY
GLUCOSE-CAPILLARY: 117 mg/dL — AB (ref 65–99)
GLUCOSE-CAPILLARY: 154 mg/dL — AB (ref 65–99)
GLUCOSE-CAPILLARY: 333 mg/dL — AB (ref 65–99)
Glucose-Capillary: 201 mg/dL — ABNORMAL HIGH (ref 65–99)

## 2016-08-10 LAB — CBC
HEMATOCRIT: 38.8 % (ref 36.0–46.0)
HEMOGLOBIN: 10.7 g/dL — AB (ref 12.0–15.0)
MCH: 24 pg — ABNORMAL LOW (ref 26.0–34.0)
MCHC: 27.6 g/dL — ABNORMAL LOW (ref 30.0–36.0)
MCV: 87 fL (ref 78.0–100.0)
Platelets: 243 10*3/uL (ref 150–400)
RBC: 4.46 MIL/uL (ref 3.87–5.11)
RDW: 15.5 % (ref 11.5–15.5)
WBC: 8.9 10*3/uL (ref 4.0–10.5)

## 2016-08-10 MED ORDER — ACETAZOLAMIDE 250 MG PO TABS
250.0000 mg | ORAL_TABLET | Freq: Three times a day (TID) | ORAL | Status: DC
  Administered 2016-08-11 (×2): 250 mg via ORAL
  Filled 2016-08-10 (×3): qty 1

## 2016-08-10 MED ORDER — WARFARIN SODIUM 5 MG PO TABS
7.5000 mg | ORAL_TABLET | Freq: Once | ORAL | Status: AC
  Administered 2016-08-10: 7.5 mg via ORAL
  Filled 2016-08-10: qty 2

## 2016-08-10 NOTE — Progress Notes (Signed)
Physical Therapy Treatment Patient Details Name: Jillian Wood MRN: [id number] DOB: 1946/05/31 Today's Date: 08/10/2016    History of Present Illness Pt admitted with acute on chronic respiratory failure with hypoxia requiring Bipap. She uses 4-6L 02 at home depending on activity level. PMH: COPD, tobacco abuse, CHF, obesity, DM, HTN, anxiety and depression.    PT Comments    Pt presented supine in bed with HOB elevated, awake and willing to participate in therapy session. Pt making progress with ambulation and able to ambulate a further distance this session with fewer standing rest breaks. Her SPO2 decreased to as low as 76% during ambulation on 6L of O2 via [place]; however, questionable accuracy of waveform. Pt's SPO2 increased to 94% after ambulation when sitting in recliner. Pt would continue to benefit from skilled physical therapy services at this time while admitted and after d/c to address her limitations in order to improve her overall safety and independence with functional mobility.    Follow Up Recommendations  Home health PT;Supervision for mobility/OOB     Equipment Recommendations  None recommended by PT    Recommendations for Other Services       Precautions / Restrictions Precautions Precautions: Fall Precaution Comments: watch 02 sats Restrictions Weight Bearing Restrictions: No    Mobility  Bed Mobility Overal bed mobility: Needs Assistance Bed Mobility: Supine to Sit     Supine to sit: Supervision;HOB elevated     General bed mobility comments: pt required increased time, use of bed rails and supervision for safety  Transfers Overall transfer level: Needs assistance Equipment used: Rolling walker (2 wheeled) Transfers: Sit to/from Stand Sit to Stand: Supervision         General transfer comment: pt required increased time, good hand placement, supervision for safety  Ambulation/Gait Ambulation/Gait assistance: Supervision Ambulation Distance  (Feet): 100 Feet Assistive device: Rolling walker (2 wheeled) Gait Pattern/deviations: Step-through pattern;Decreased stride length;Trunk flexed Gait velocity: decreased Gait velocity interpretation: Below normal speed for age/gender General Gait Details: pt able to ambulate further and required only three standing rest breaks this session as compared to several during last session. pt ambulated on 6L of O2 via [place]. During ambulation pt's SPO2 decreased as low as 76% (questionable accuracy of waveform), but increased back up to 94% at end of walk when sitting in recliner.   Stairs            Wheelchair Mobility    Modified Rankin (Stroke Patients Only)       Balance Overall balance assessment: Needs assistance Sitting-balance support: Feet supported;No upper extremity supported Sitting balance-Leahy Scale: Good     Standing balance support: During functional activity;Bilateral upper extremity supported Standing balance-Leahy Scale: Poor Standing balance comment: pt reliant on bilateral UEs on RW                    Cognition Arousal/Alertness: Awake/alert Behavior During Therapy: WFL for tasks assessed/performed Overall Cognitive Status: Within Functional Limits for tasks assessed                      Exercises      General Comments        Pertinent Vitals/Pain Pain Assessment: No/denies pain    Home Living                      Prior Function            PT Goals (current goals can now be found  in the care plan section) Acute Rehab PT Goals Patient Stated Goal: return home with her family PT Goal Formulation: With patient Time For Goal Achievement: 08/16/16 Potential to Achieve Goals: Good Progress towards PT goals: Progressing toward goals    Frequency    Min 3X/week      PT Plan Current plan remains appropriate    Co-evaluation             End of Session Equipment Utilized During Treatment: Gait belt;Oxygen (6L of  O2 via [place]) Activity Tolerance: Patient limited by fatigue Patient left: in chair;with call bell/phone within reach;with chair alarm set     Time: 1015-1050 PT Time Calculation (min) (ACUTE ONLY): 35 min  Charges:  $Gait Training: 23-37 mins                    G CodesClearnce Sorrel Jamiracle [employer] 08/26/16, 12:34 PM Sherie Don, PT, DPT [phone]

## 2016-08-10 NOTE — Plan of Care (Signed)
Problem: Activity: Goal: Risk for activity intolerance will decrease Outcome: Progressing Discussed the importance of activity but with slow steady movements when getting up and down from a sitting position with some teach back displayed.

## 2016-08-10 NOTE — Progress Notes (Signed)
ANTICOAGULATION CONSULT NOTE - Follow Up Consult  Pharmacy Consult for warfarin/enoxaparin Indication: atrial fibrillation  Allergies  Allergen Reactions  . Inderal [Propranolol] Other (See Comments)    Hair loss  . Ace Inhibitors Cough    Patient Measurements: Height: 5\' 4"  (162.6 cm) Weight: 253 lb 8.5 oz (115 kg) IBW/kg (Calculated) : 54.7  Vital Signs: Temp: 97.9 F (36.6 C) (11/25 0321) Temp Source: Oral (11/25 0321) BP: 129/82 (11/25 0321)  Labs:  Recent Labs  08/08/16 0300 08/09/16 0239 08/10/16 0303  HGB 10.7* 10.4* 10.7*  HCT 39.0 37.8 38.8  PLT 287 273 243  LABPROT 14.0 14.4 17.6*  INR 1.07 1.11 1.43    Estimated Creatinine Clearance: 65.8 mL/min (by C-G formula based on SCr of 0.99 mg/dL).   Assessment: Jillian Wood is a 70 year old female admitted 07/31/2016 with new onset atrial fibrillation. She was initiated on Eliquis but is unable to afford the co-pay. Therefore was transitioned to warfarin with a Lovenox bridge on 11/22. CHADSVASc Score 4 - rate controlled, remains in Afib INR 1.43 (subtherapeutic), Hgb 10.7 stable, plts wnl, no signs/symptoms of bleeding noted.   SCr 0.99, estimated CrCl 65.8 ml/min Weight 115 kg (varied over last 3 days 112kg-107kg-115kg)  Goal of Therapy:  INR 2-3 Monitor platelets by anticoagulation protocol: Yes   Plan:  - Warfarin 7.5 mg x1 tonight - Continue enoxaparin to 110 mg SQ q12h until INR >2 - Monitor daily INR, CBC and signs/symptoms of bleeding  Nicole Cella, RPh Clinical Pharmacist Pager: [phone] 08/10/2016,12:08 PM

## 2016-08-10 NOTE — Progress Notes (Signed)
PROGRESS NOTE [facility]   PHAITH BOBEK   [id number] DOB: 27-Mar-1946  DOA: 07/31/2016 PCP: Alesia Richards, MD   Brief Narrative:  70 y.o.WF PMHx Depression, Anxiety HTN, PSVT, HLD,Diabetes mellitus type 2 in obese uncontrolled with complications, COPD, oxygen dependent, OSA, GERD, Hypothyroidism, Tobacco abuse, dCHF, presented to the ED with worsening of SOB. Patient was admitted with COPD exacerbation with needs to incrase in O2 supplementation. Patient also noted to be in new onset Afib, started on a/c with warfarin and rate controlled medications.   Subjective: No complaints today. O2 on 6L [place] breathing ok. No acute events overnight. INR 1.42.   Assessment & Plan: Acute on chronic respiratory failure with hypoxia: - O2 requirements improving  Likely to be multifactorial COPD exacerbation, OSA noncompliant with CPAP and ? CHF Completed 5 days of antibiotics azithromycin. Continue nebulizers. Continue prednisone 40 mg daily Flutter valve Continue BiPAP at night, tolerated well. Astral NIV device at bedside Wean O2 to keep saturations above 89%.  COPD exacerbation  See above   Acute on chronic diastolic CHF - ? Accuracy of weights gained 18 lbs from yesterday (doubfull) Continue current medications - Bb, Acetazolamide  Daily weights  Strict I&O's  HTN - stable  On BB and Cardizem   Afib - (new diagnosis) CHADSVASc Score 4 - rate controlled, remains in Afib On Warfarin INR 1.4.2 - repeat 7.5 dose tonight  - INR goal 2-3 If tomorrow close to 2 will d/c with close follow up with PMD  Continue Cardizem and metoprolol  Monitor tele   Pulmonary HTN  See CHF   DM type 2- Controlled, A1C 5.2 Continue Lantus  SSI  Monitor CBG's   GERD Pepcid  DVT prophylaxis: Warfarin/Lovenox  Code Status: Full  Family Communication: None at bedside  Disposition Plan: Home when INR close to 2   Consultants:   Cardiology   Pharmacy   PCCM   Procedures:     11/17 ECHO - [facility]  - EF 65-70% Left atrium severely dilated, Right atrium mild dilation, Increase in pulmonary arteries pressures   Antimicrobials:  Azithromycin 11/15 - 11/19    Objective: Vitals:   08/10/16 0059 08/10/16 0321 08/10/16 0622 08/10/16 0853  BP:  129/82    Pulse:      Resp:  20    Temp: 98.1 F (36.7 C) 97.9 F (36.6 C)    TempSrc: Oral Oral    SpO2:    91%  Weight:   115 kg (253 lb 8.5 oz)   Height:        Intake/Output Summary (Last 24 hours) at 08/10/16 0935 Last data filed at 08/10/16 0730  Gross per 24 hour  Intake              500 ml  Output                0 ml  Net              500 ml   Filed Weights   08/08/16 0313 08/09/16 0243 08/10/16 0622  Weight: 111.7 kg (246 lb 4.1 oz) 107 kg (236 lb) 115 kg (253 lb 8.5 oz)    Examination:  General exam: Sitting up having breakfast  Respiratory system: Clear to auscultation, no wheezing or rales  Cardiovascular system: Irr Irr, S1 & S2 heard, No JVD, murmurs, rubs or gallops Gastrointestinal system: Abdomen NT,ND soft  Extremities: No pedal edema.  Skin: No rashes, lesions or ulcers  Data Reviewed: I have  personally reviewed following labs and imaging studies  CBC:  Recent Labs Lab 08/06/16 0300 08/08/16 0300 08/09/16 0239 08/10/16 0303  WBC 8.0 7.5 8.0 8.9  HGB 10.6* 10.7* 10.4* 10.7*  HCT 38.4 39.0 37.8 38.8  MCV 86.7 86.1 87.1 87.0  PLT 290 287 273 0000000   Basic Metabolic Panel:  Recent Labs Lab 08/04/16 0526 08/05/16 0228 08/06/16 0300  NA 138 137 135  K 3.4* 4.9 5.1  CL 81* 84* 85*  CO2 45* 44* 41*  GLUCOSE 167* 204* 197*  BUN 37* 37* 37*  CREATININE 1.02* 0.98 0.99  CALCIUM 9.5 9.7 9.4  MG 2.1  --   --    GFR: Estimated Creatinine Clearance: 65.8 mL/min (by C-G formula based on SCr of 0.99 mg/dL). Liver Function Tests: No results for input(s): AST, ALT, ALKPHOS, BILITOT, PROT, ALBUMIN in the last 168 hours. No results for input(s): LIPASE, AMYLASE in the last 168  hours. No results for input(s): AMMONIA in the last 168 hours. Coagulation Profile:  Recent Labs Lab 08/08/16 0300 08/09/16 0239 08/10/16 0303  INR 1.07 1.11 1.43   Cardiac Enzymes: No results for input(s): CKTOTAL, CKMB, CKMBINDEX, TROPONINI in the last 168 hours. BNP (last 3 results) No results for input(s): PROBNP in the last 8760 hours. HbA1C: No results for input(s): HGBA1C in the last 72 hours. CBG:  Recent Labs Lab 08/09/16 0855 08/09/16 1248 08/09/16 1713 08/09/16 2052 08/10/16 0834  GLUCAP 111* 192* 391* 211* 117*   Lipid Profile: No results for input(s): CHOL, HDL, LDLCALC, TRIG, CHOLHDL, LDLDIRECT in the last 72 hours. Thyroid Function Tests: No results for input(s): TSH, T4TOTAL, FREET4, T3FREE, THYROIDAB in the last 72 hours. Anemia Panel: No results for input(s): VITAMINB12, FOLATE, FERRITIN, TIBC, IRON, RETICCTPCT in the last 72 hours. Sepsis Labs: No results for input(s): PROCALCITON, LATICACIDVEN in the last 168 hours.  Recent Results (from the past 240 hour(s))  Blood culture (routine x 2)     Status: None   Collection Time: 07/31/16  9:07 PM  Result Value Ref Range Status   Specimen Description BLOOD RIGHT HAND  Final   Special Requests BOTTLES DRAWN AEROBIC AND ANAEROBIC 5CC  Final   Culture NO GROWTH 5 DAYS  Final   Report Status 08/05/2016 FINAL  Final  Culture, blood (routine x 2) Call MD if unable to obtain prior to antibiotics being given     Status: None   Collection Time: 07/31/16  9:08 PM  Result Value Ref Range Status   Specimen Description BLOOD LEFT HAND  Final   Special Requests BOTTLES DRAWN AEROBIC AND ANAEROBIC 5CC  Final   Culture NO GROWTH 5 DAYS  Final   Report Status 08/05/2016 FINAL  Final  Respiratory Panel by PCR     Status: None   Collection Time: 07/31/16 10:25 PM  Result Value Ref Range Status   Adenovirus NOT DETECTED NOT DETECTED Final   Coronavirus 229E NOT DETECTED NOT DETECTED Final   Coronavirus HKU1 NOT  DETECTED NOT DETECTED Final   Coronavirus NL63 NOT DETECTED NOT DETECTED Final   Coronavirus OC43 NOT DETECTED NOT DETECTED Final   Metapneumovirus NOT DETECTED NOT DETECTED Final   Rhinovirus / Enterovirus NOT DETECTED NOT DETECTED Final   Influenza A NOT DETECTED NOT DETECTED Final   Influenza B NOT DETECTED NOT DETECTED Final   Parainfluenza Virus 1 NOT DETECTED NOT DETECTED Final   Parainfluenza Virus 2 NOT DETECTED NOT DETECTED Final   Parainfluenza Virus 3 NOT DETECTED NOT DETECTED  Final   Parainfluenza Virus 4 NOT DETECTED NOT DETECTED Final   Respiratory Syncytial Virus NOT DETECTED NOT DETECTED Final   Bordetella pertussis NOT DETECTED NOT DETECTED Final   Chlamydophila pneumoniae NOT DETECTED NOT DETECTED Final   Mycoplasma pneumoniae NOT DETECTED NOT DETECTED Final  MRSA PCR Screening     Status: None   Collection Time: 08/01/16  7:05 AM  Result Value Ref Range Status   MRSA by PCR NEGATIVE NEGATIVE Final    Comment:        The GeneXpert MRSA Assay (FDA approved for NASAL specimens only), is one component of a comprehensive MRSA colonization surveillance program. It is not intended to diagnose MRSA infection nor to guide or monitor treatment for MRSA infections.      Radiology Studies: No results found.   Scheduled Meds: . acetaZOLAMIDE  250 mg Oral TID  . cholecalciferol  1,000 Units Oral Daily  . diltiazem  360 mg Oral Daily  . DULoxetine  60 mg Oral Daily  . enoxaparin (LOVENOX) injection  110 mg Subcutaneous Q12H  . famotidine  20 mg Oral BID  . guaiFENesin  600 mg Oral Daily  . insulin aspart  0-20 Units Subcutaneous TID WC  . insulin aspart  0-5 Units Subcutaneous QHS  . insulin glargine  8 Units Subcutaneous Daily  . ipratropium  0.5 mg Nebulization Q6H  . levalbuterol  1.25 mg Nebulization Q6H  . levothyroxine  175 mcg Oral QAC breakfast  . metoprolol tartrate  25 mg Oral BID  . nicotine  14 mg Transdermal Daily  . polyethylene glycol  17 g Oral  Daily  . pravastatin  40 mg Oral Daily  . predniSONE  40 mg Oral Q breakfast  . sodium chloride flush  3 mL Intravenous Q12H  . Warfarin - Pharmacist Dosing Inpatient   Does not apply q1800   Continuous Infusions:   LOS: 10 days    Chipper Oman, MD [facility] Pager [phone]  If 7PM-7AM, please contact night-coverage [email or web address] Password [email or web address] 08/10/2016, 9:35 AM

## 2016-08-11 DIAGNOSIS — I130 Hypertensive heart and chronic kidney disease with heart failure and stage 1 through stage 4 chronic kidney disease, or unspecified chronic kidney disease: Secondary | ICD-10-CM | POA: Diagnosis not present

## 2016-08-11 DIAGNOSIS — I420 Dilated cardiomyopathy: Secondary | ICD-10-CM | POA: Diagnosis not present

## 2016-08-11 DIAGNOSIS — J9610 Chronic respiratory failure, unspecified whether with hypoxia or hypercapnia: Secondary | ICD-10-CM | POA: Diagnosis not present

## 2016-08-11 DIAGNOSIS — I471 Supraventricular tachycardia: Secondary | ICD-10-CM | POA: Diagnosis not present

## 2016-08-11 DIAGNOSIS — I4891 Unspecified atrial fibrillation: Secondary | ICD-10-CM | POA: Diagnosis not present

## 2016-08-11 DIAGNOSIS — Z6841 Body Mass Index (BMI) 40.0 and over, adult: Secondary | ICD-10-CM | POA: Diagnosis not present

## 2016-08-11 DIAGNOSIS — I272 Pulmonary hypertension, unspecified: Secondary | ICD-10-CM | POA: Diagnosis not present

## 2016-08-11 DIAGNOSIS — J449 Chronic obstructive pulmonary disease, unspecified: Secondary | ICD-10-CM | POA: Diagnosis not present

## 2016-08-11 DIAGNOSIS — J9621 Acute and chronic respiratory failure with hypoxia: Secondary | ICD-10-CM | POA: Diagnosis not present

## 2016-08-11 DIAGNOSIS — I248 Other forms of acute ischemic heart disease: Secondary | ICD-10-CM | POA: Diagnosis not present

## 2016-08-11 DIAGNOSIS — E1169 Type 2 diabetes mellitus with other specified complication: Secondary | ICD-10-CM | POA: Diagnosis not present

## 2016-08-11 DIAGNOSIS — J441 Chronic obstructive pulmonary disease with (acute) exacerbation: Secondary | ICD-10-CM | POA: Diagnosis not present

## 2016-08-11 DIAGNOSIS — E1122 Type 2 diabetes mellitus with diabetic chronic kidney disease: Secondary | ICD-10-CM | POA: Diagnosis not present

## 2016-08-11 DIAGNOSIS — I5033 Acute on chronic diastolic (congestive) heart failure: Secondary | ICD-10-CM | POA: Diagnosis not present

## 2016-08-11 LAB — PROTIME-INR
INR: 1.8
INR: 1.9
Prothrombin Time: 21.1 seconds — ABNORMAL HIGH (ref 11.4–15.2)
Prothrombin Time: 22.1 seconds — ABNORMAL HIGH (ref 11.4–15.2)

## 2016-08-11 LAB — BASIC METABOLIC PANEL
Anion gap: 7 (ref 5–15)
BUN: 25 mg/dL — AB (ref 6–20)
CHLORIDE: 100 mmol/L — AB (ref 101–111)
CO2: 35 mmol/L — AB (ref 22–32)
CREATININE: 0.92 mg/dL (ref 0.44–1.00)
Calcium: 9.3 mg/dL (ref 8.9–10.3)
GFR calc Af Amer: >60 mL/min (ref >60)
GFR calc non Af Amer: >60 mL/min (ref >60)
Glucose, Bld: 123 mg/dL — ABNORMAL HIGH (ref 65–99)
POTASSIUM: 6.2 mmol/L — AB (ref 3.5–5.1)
Sodium: 142 mmol/L (ref 135–145)

## 2016-08-11 LAB — POTASSIUM
POTASSIUM: 6.2 mmol/L — AB (ref 3.5–5.1)
POTASSIUM: 4.4 mmol/L (ref 3.5–5.1)

## 2016-08-11 LAB — GLUCOSE, CAPILLARY
GLUCOSE-CAPILLARY: 98 mg/dL (ref 65–99)
GLUCOSE-CAPILLARY: 200 mg/dL — AB (ref 65–99)
Glucose-Capillary: 149 mg/dL — ABNORMAL HIGH (ref 65–99)

## 2016-08-11 MED ORDER — METOPROLOL TARTRATE 25 MG PO TABS: 25.0000 mg | ORAL_TABLET | Freq: Two times a day (BID) | ORAL | 0 refills | Status: DC

## 2016-08-11 MED ORDER — FUROSEMIDE 10 MG/ML IJ SOLN
40.0000 mg | Freq: Once | INTRAMUSCULAR | Status: AC
  Administered 2016-08-11: 40 mg via INTRAVENOUS
  Filled 2016-08-11: qty 4

## 2016-08-11 MED ORDER — SODIUM CHLORIDE 0.9 % IV BOLUS (SEPSIS)
500.0000 mL | Freq: Once | INTRAVENOUS | Status: AC
  Administered 2016-08-11: 500 mL via INTRAVENOUS

## 2016-08-11 MED ORDER — DILTIAZEM HCL ER COATED BEADS 300 MG PO CP24: 300.0000 mg | ORAL_CAPSULE | Freq: Every day | ORAL | 0 refills | Status: DC

## 2016-08-11 MED ORDER — PREDNISONE 10 MG (21) PO TBPK: ORAL_TABLET | ORAL | 0 refills | Status: DC

## 2016-08-11 MED ORDER — BUDESONIDE-FORMOTEROL FUMARATE 160-4.5 MCG/ACT IN AERO: INHALATION_SPRAY | RESPIRATORY_TRACT | 0 refills | Status: DC

## 2016-08-11 MED ORDER — PREDNISONE 20 MG PO TABS
30.0000 mg | ORAL_TABLET | Freq: Every day | ORAL | Status: DC
  Administered 2016-08-11: 30 mg via ORAL
  Filled 2016-08-11: qty 1

## 2016-08-11 MED ORDER — WARFARIN SODIUM 7.5 MG PO TABS: 7.5000 mg | ORAL_TABLET | Freq: Once | ORAL | 0 refills | Status: DC

## 2016-08-11 MED ORDER — FUROSEMIDE 10 MG/ML IJ SOLN
40.0000 mg | Freq: Once | INTRAMUSCULAR | Status: DC
  Filled 2016-08-11: qty 4

## 2016-08-11 MED ORDER — WARFARIN SODIUM 5 MG PO TABS
5.0000 mg | ORAL_TABLET | Freq: Once | ORAL | Status: AC
  Administered 2016-08-11: 5 mg via ORAL
  Filled 2016-08-11: qty 1

## 2016-08-11 NOTE — Progress Notes (Signed)
Patient K+ 6.2 from 5.1 paged day MD. Will have first shift follow-up

## 2016-08-11 NOTE — Progress Notes (Signed)
ANTICOAGULATION CONSULT NOTE - Follow Up Consult  Pharmacy Consult for warfarin/enoxaparin Indication: atrial fibrillation  Allergies  Allergen Reactions  . Inderal [Propranolol] Other (See Comments)    Hair loss  . Ace Inhibitors Cough    Patient Measurements: Height: 5\' 4"  (162.6 cm) Weight: 237 lb (107.5 kg) IBW/kg (Calculated) : 54.7  Vital Signs: Temp: 97 F (36.1 C) (11/26 0343) Temp Source: Axillary (11/26 0343) BP: 117/60 (11/26 0959) Pulse Rate: 73 (11/26 0500)  Labs:  Recent Labs  08/09/16 0239 08/10/16 0303 08/11/16 0318  HGB 10.4* 10.7*  --   HCT 37.8 38.8  --   PLT 273 243  --   LABPROT 14.4 17.6* 21.1*  INR 1.11 1.43 1.80  CREATININE  --   --  0.92    Estimated Creatinine Clearance: 68.1 mL/min (by C-G formula based on SCr of 0.92 mg/dL).   Assessment: Jillian Wood is a 70 year old female admitted 07/31/2016 with new onset atrial fibrillation. She was initiated on Eliquis but is unable to afford the co-pay. Therefore was transitioned to warfarin with a Lovenox bridge on 11/22. CHADSVASc Score 4 - rate controlled, remains in Afib INR 1.8 trending up after 4 days of warfarin, no CBC today On 11/25 Hgb 10.7 stable, plts wnl- no bleeding noted SCr wnl stable , estimated CrCl 65 ml/min Weight 107.5 kg  Goal of Therapy:  INR 2-3 Monitor platelets by anticoagulation protocol: Yes   Plan:  - Warfarin 5 mg x1 tonight - Continue enoxaparin to 110 mg SQ q12h until INR >2 - Monitor daily INR, CBC and signs/symptoms of bleeding  Nicole Cella, RPh Clinical Pharmacist Pager: [phone] 08/11/2016,1:41 PM

## 2016-08-11 NOTE — Discharge Summary (Signed)
Physician Discharge Summary  Jillian Wood  [id number]  DOB: Jul 28, 1946  DOA: 07/31/2016 PCP: Alesia Richards, MD  Admit date: 07/31/2016 Discharge date: 08/11/2016  Admitted From: Home  Disposition:  Home   Recommendations for Outpatient Follow-up:  1. Follow up with PCP in 1 week 2. Please obtain BMP/CBC in one week 3. Please refer to pulmonary rehabilitation   Home Health: None   Equipment/Devices: Astral NIV, O2 5L   Discharge Condition: Stable  CODE STATUS: Full  Diet recommendation: Heart Healthy / Carb Modified  Brief/Interim Summary: 70 y.o.WF PMHxDepression, Anxiety HTN, PSVT, HLD,Diabetes mellitus type 2 in obese uncontrolled with complications, COPD, oxygen dependent, OSA, GERD, Hypothyroidism, Tobacco abuse, dCHF, presented to the ED with worsening of SOB. Patient was admitted with COPD exacerbation with needs to incrase in O2 supplementation. Patient also noted to be in new onset Afib, started on a/c with warfarin and rate controlled medications.  Subjective: Patient seen and examined this am. Patient breathing continues to be at baseline, O2 5L no new complaints. Remains afebrile. K was elevated, Lasix one dose and a bolus of 500 NS was given K down to normal levels. INR 1.9, remains in afib but rate controlled   Discharge Diagnoses:  Acute on chronic respiratory failure with hypoxia: - Improved back to baseline  Likely to be multifactorial COPD exacerbation, OSA noncompliant with CPAP and ? CHF Completed 5 days of antibiotics azithromycin. Continue Inhale LABA Prednisone Taper for 21 days, patient may need long term steroids, consult pulmonary doctor  Continue CPAP at night Patient need sleep studies, appointment on Aug 29 2016 Astral NIV O2 as tolerated  Patient will benefit from pulmonary rehab please refer from outpatient clinic  COPD exacerbation  See above   Acute on chronic diastolic CHF - stable  Continue current medications - Bb,  Acetazolamide  Daily weights  Strict I&O's  HTN - stable  On BB and Cardizem   Afib - (new diagnosis) CHADSVASc Score 4 - rate controlled, remains in Afib On A/C with warfarin, could afford co-pay for a NOAC, INR goal 2-3 - today 1.90 Continue Warfarin 7.5 mg - patient stated that she can get an appointment with her PMD tomorrow in the afternoon, for INR check - monitor an adjust as necessary  Continue Cardizem and metoprolol   Advised on avoid green leaf veggies   Pulmonary HTN  See CHF   DM type 2- Controlled, A1C 5.2 Resume metformin -  Monitor  GERD Continue Pepcid 20 mg BID    Discharge Instructions  Discharge Instructions    Call MD for:  difficulty breathing, headache or visual disturbances    Complete by:  As directed    Call MD for:  extreme fatigue    Complete by:  As directed    Call MD for:  hives    Complete by:  As directed    Call MD for:  persistant dizziness or light-headedness    Complete by:  As directed    Call MD for:  persistant nausea and vomiting    Complete by:  As directed    Call MD for:  redness, tenderness, or signs of infection (pain, swelling, redness, odor or green/yellow discharge around incision site)    Complete by:  As directed    Call MD for:  severe uncontrolled pain    Complete by:  As directed    Call MD for:  temperature >100.4    Complete by:  As directed    Diet -  low sodium heart healthy    Complete by:  As directed    Discharge instructions    Complete by:  As directed    Check INR tomorrow with PMD  Avoid green leaf veggies   Increase activity slowly    Complete by:  As directed        Medication List    STOP taking these medications   CHANTIX CONTINUING MONTH PAK 1 MG tablet Generic drug:  varenicline   diltiazem 60 MG tablet Commonly known as:  CARDIZEM   losartan 50 MG tablet Commonly known as:  COZAAR     TAKE these medications   acetaminophen-codeine 300-30 MG tablet Commonly known as:   TYLENOL #3 Take 1 tablet by mouth every 8 (eight) hours as needed for moderate pain or severe pain.   acetaZOLAMIDE 250 MG tablet Commonly known as:  DIAMOX TAKE 1 TABLET (250 MG TOTAL) BY MOUTH 3 (THREE) TIMES DAILY. What changed:  when to take this   albuterol 108 (90 Base) MCG/ACT inhaler Commonly known as:  PROVENTIL HFA;VENTOLIN HFA Inhale 1-2 puffs into the lungs every 6 (six) hours as needed for wheezing or shortness of breath.   aspirin 81 MG tablet Take 81 mg by mouth daily.   budesonide-formoterol 160-4.5 MCG/ACT inhaler Commonly known as:  SYMBICORT 2 puffs twice a day, wash mouth afterwards What changed:  how much to take  how to take this  when to take this  reasons to take this  additional instructions   diltiazem 300 MG 24 hr capsule Commonly known as:  CARDIZEM CD Take 1 capsule (300 mg total) by mouth daily.   DULoxetine 60 MG capsule Commonly known as:  CYMBALTA TAKE 1 CAPSULE BY MOUTH DAILY   famotidine 20 MG tablet Commonly known as:  PEPCID TAKE 1 TABLET (20 MG TOTAL) BY MOUTH 2 (TWO) TIMES DAILY. FOR ACID REFLUX   guaiFENesin 600 MG 12 hr tablet Commonly known as:  MUCINEX Take 600 mg by mouth daily.   levothyroxine 175 MCG tablet Commonly known as:  SYNTHROID, LEVOTHROID Take 1 tablet (175 mcg total) by mouth daily before breakfast.   loratadine-pseudoephedrine 10-240 MG 24 hr tablet Commonly known as:  CLARITIN-D 24-hour Take 1 tablet by mouth as needed for allergies.   MAGNESIUM DR PO Take 1 capsule by mouth daily.   metFORMIN 500 MG 24 hr tablet Commonly known as:  GLUCOPHAGE-XR TAKE 1 TABLET (500 MG TOTAL) BY MOUTH 4 (FOUR) TIMES DAILY - AFTER MEALS AND AT BEDTIME.   metoprolol tartrate 25 MG tablet Commonly known as:  LOPRESSOR Take 1 tablet (25 mg total) by mouth 2 (two) times daily.   OXYGEN Inhale 3 L into the lungs.   pravastatin 40 MG tablet Commonly known as:  PRAVACHOL TAKE 1 TABLET (40 MG TOTAL) BY MOUTH  DAILY.   predniSONE 10 MG (21) Tbpk tablet Commonly known as:  STERAPRED UNI-PAK 21 TAB Take 4 pills a day for 6 days Take 3 pills a day for 5 days  Take 2 pills a day for 4 days  Take 1 pill a day for 6 days   VITAMIN D PO Take 2,000 Units by mouth daily.   warfarin 7.5 MG tablet Commonly known as:  COUMADIN Take 1 tablet (7.5 mg total) by mouth one time only at 6 PM.            Durable Medical Equipment        Start     Ordered  08/06/16 1222  For home use only DME 4 wheeled rolling walker with seat  Once    Question Answer Comment  Patient needs a walker to treat with the following condition COPD (chronic obstructive pulmonary disease) ([place])   Patient needs a walker to treat with the following condition CHF (congestive heart failure) ([place])      08/06/16 1222   08/06/16 1214  For home use only DME Nebulizer machine  Once    Question Answer Comment  Patient needs a nebulizer to treat with the following condition CHF (congestive heart failure) ([place])   Patient needs a nebulizer to treat with the following condition COPD (chronic obstructive pulmonary disease) ([place])      08/06/16 1214   08/02/16 1633  For home use only DME 3 n 1  Once    Comments:  Needs the wide 3 n 1   08/02/16 1633     Follow-up Information    [facility] Follow up.   Why:  appointment scheduled for August 29, 2016 at 8:00, office will mail you package with instructions.  Contact information: [address] [place], [place]. Daphane Shepherd on 08/13/2016.   Specialty:  Internal Medicine Why:  Walk in for INR check  Contact information: [address] [place] [address] [phone]          Allergies  Allergen Reactions  . Inderal [Propranolol] Other (See Comments)    Hair loss  . Ace Inhibitors Cough    Consultations:  Cardiology   Pulmonary    Procedures/Studies: Dg Chest 2  View  Result Date: 07/31/2016 CLINICAL DATA:  Cough and dyspnea for 4 days. EXAM: CHEST  2 VIEW COMPARISON:  09/27/2014 chest radiograph FINDINGS: The heart is top-normal in size. There is aortic atherosclerosis. Mild diffuse interstitial prominence consistent with interstitial edema is noted. There are small bilateral pleural effusions posteriorly blunting the costophrenic angles right greater than left. There is bibasilar atelectasis. No suspicious osseous lesions. IMPRESSION: Interstitial pulmonary edema with small pleural effusions and borderline cardiomegaly. Aortic atherosclerosis. Electronically Signed   By: Ashley Royalty M.D.   On: 07/31/2016 17:50   11/17 ECHO - [facility]  - EF 65-70% Left atrium severely dilated, Right atrium mild dilation, Increase in pulmonary arteries pressures   Discharge Exam: Vitals:   08/11/16 1200 08/11/16 1634  BP: (!) 118/59   Pulse:    Resp:  18  Temp: 98 F (36.7 C) 98.2 F (36.8 C)   Vitals:   08/11/16 0959 08/11/16 1200 08/11/16 1616 08/11/16 1634  BP: 117/60 (!) 118/59    Pulse:      Resp:    18  Temp:  98 F (36.7 C)  98.2 F (36.8 C)  TempSrc:  Oral  Oral  SpO2:   92%   Weight:      Height:        General: Pt is alert, awake, not in acute distress Cardiovascular: IRR IRR, S1/S2 +, no rubs, no gallops Respiratory: CTA bilaterally, no wheezing, no rhonchi Abdominal:  Obese Soft, NT, ND, bowel sounds + Extremities: no edema, no cyanosis    The results of significant diagnostics from this hospitalization (including imaging, microbiology, ancillary and laboratory) are listed below for reference.     Microbiology: No results found for this or any previous visit (from the past 240 hour(s)).   Labs: BNP (last 3 results)  Recent Labs  07/31/16 1805  BNP [id number]*   Basic Metabolic Panel:  Recent Labs Lab 08/05/16 0228 08/06/16 0300 08/11/16 0318 08/11/16 0725 08/11/16 1319  NA 137 135 142  --   --   K 4.9 5.1 6.2* 6.2* 4.4  CL  84* 85* 100*  --   --   CO2 44* 41* 35*  --   --   GLUCOSE 204* 197* 123*  --   --   BUN 37* 37* 25*  --   --   CREATININE 0.98 0.99 0.92  --   --   CALCIUM 9.7 9.4 9.3  --   --    Liver Function Tests: No results for input(s): AST, ALT, ALKPHOS, BILITOT, PROT, ALBUMIN in the last 168 hours. No results for input(s): LIPASE, AMYLASE in the last 168 hours. No results for input(s): AMMONIA in the last 168 hours. CBC:  Recent Labs Lab 08/06/16 0300 08/08/16 0300 08/09/16 0239 08/10/16 0303  WBC 8.0 7.5 8.0 8.9  HGB 10.6* 10.7* 10.4* 10.7*  HCT 38.4 39.0 37.8 38.8  MCV 86.7 86.1 87.1 87.0  PLT 290 287 273 243   Cardiac Enzymes: No results for input(s): CKTOTAL, CKMB, CKMBINDEX, TROPONINI in the last 168 hours. BNP: Invalid input(s): POCBNP CBG:  Recent Labs Lab 08/10/16 1703 08/10/16 2100 08/11/16 0825 08/11/16 1155 08/11/16 1633  GLUCAP 201* 333* 98 149* 200*   D-Dimer No results for input(s): DDIMER in the last 72 hours. Hgb A1c No results for input(s): HGBA1C in the last 72 hours. Lipid Profile No results for input(s): CHOL, HDL, LDLCALC, TRIG, CHOLHDL, LDLDIRECT in the last 72 hours. Thyroid function studies No results for input(s): TSH, T4TOTAL, T3FREE, THYROIDAB in the last 72 hours.  Invalid input(s): FREET3 Anemia work up No results for input(s): VITAMINB12, FOLATE, FERRITIN, TIBC, IRON, RETICCTPCT in the last 72 hours. Urinalysis    Component Value Date/Time   COLORURINE YELLOW 07/27/2015 1612   APPEARANCEUR CLEAR 07/27/2015 1612   LABSPEC 1.017 07/27/2015 1612   PHURINE 6.0 07/27/2015 1612   GLUCOSEU NEGATIVE 07/27/2015 1612   HGBUR 2+ (A) 07/27/2015 1612   BILIRUBINUR NEGATIVE 07/27/2015 1612   KETONESUR NEGATIVE 07/27/2015 1612   PROTEINUR NEGATIVE 07/27/2015 1612   UROBILINOGEN 1.0 10/23/2009 1145   NITRITE NEGATIVE 07/27/2015 1612   LEUKOCYTESUR NEGATIVE 07/27/2015 1612   Sepsis Labs Invalid input(s): PROCALCITONIN,  WBC,   LACTICIDVEN Microbiology No results found for this or any previous visit (from the past 240 hour(s)).   Time coordinating discharge: Over 30 minutes  SIGNED:  Chipper Oman, MD  [facility] 08/11/2016, 4:43 PM Pager   If 7PM-7AM, please contact night-coverage [email or web address] Password [email or web address]

## 2016-08-13 ENCOUNTER — Encounter: Payer: Self-pay | Admitting: Internal Medicine

## 2016-08-13 ENCOUNTER — Ambulatory Visit (INDEPENDENT_AMBULATORY_CARE_PROVIDER_SITE_OTHER): Payer: PPO | Admitting: Internal Medicine

## 2016-08-13 VITALS — BP 110/66 | HR 80 | Temp 97.0°F | Resp 16 | Ht 64.5 in | Wt 236.0 lb

## 2016-08-13 DIAGNOSIS — R0689 Other abnormalities of breathing: Secondary | ICD-10-CM | POA: Diagnosis not present

## 2016-08-13 DIAGNOSIS — J441 Chronic obstructive pulmonary disease with (acute) exacerbation: Secondary | ICD-10-CM

## 2016-08-13 DIAGNOSIS — I480 Paroxysmal atrial fibrillation: Secondary | ICD-10-CM | POA: Diagnosis not present

## 2016-08-13 DIAGNOSIS — Z7901 Long term (current) use of anticoagulants: Secondary | ICD-10-CM | POA: Diagnosis not present

## 2016-08-13 DIAGNOSIS — E1122 Type 2 diabetes mellitus with diabetic chronic kidney disease: Secondary | ICD-10-CM | POA: Diagnosis not present

## 2016-08-13 DIAGNOSIS — J439 Emphysema, unspecified: Secondary | ICD-10-CM | POA: Diagnosis not present

## 2016-08-13 DIAGNOSIS — Z79899 Other long term (current) drug therapy: Secondary | ICD-10-CM | POA: Diagnosis not present

## 2016-08-13 DIAGNOSIS — N183 Chronic kidney disease, stage 3 (moderate): Secondary | ICD-10-CM

## 2016-08-13 MED ORDER — ACETAZOLAMIDE 125 MG PO TABS: ORAL_TABLET | ORAL | 1 refills | Status: DC

## 2016-08-13 NOTE — Patient Instructions (Signed)
. Bleeding Precautions When on Anticoagulant Therapy WHAT IS ANTICOAGULANT THERAPY? Anticoagulant therapy is taking medicine to prevent or reduce blood clots. It is also called blood thinner therapy. Blood clots that form in your blood vessels can be dangerous. They can break loose and travel to your heart, lungs, or brain. This increases your risk of a heart attack or stroke. Anticoagulant therapy causes blood to clot more slowly. You may need anticoagulant therapy if you have:  A medical condition that increases the likelihood that blood clots will form.  A heart defect or a problem with heart rhythm. It is also a common treatment after heart surgery, such as valve replacement. WHAT ARE COMMON TYPES OF ANTICOAGULANT THERAPY? Anticoagulant medicine can be injected or taken by mouth.If you need anticoagulant therapy quickly at the hospital, the medicine may be injected under your skin or given through an IV tube. Heparin is a common example of an anticoagulant that you may get at the hospital. Most anticoagulant therapy is in the form of pills that you take at home every day. These may include:  Aspirin. This common blood thinner works by preventing blood cells (platelets) from sticking together to form a clot. Aspirin is not as strong as anticoagulants that slow down the time that it takes for your body to form a clot.  Clopidogrel. This is a newer type of drug that affects platelets. It is stronger than aspirin.  Warfarin. This is the most common anticoagulant. It changes the way your body uses vitamin K, a vitamin that helps your blood to clot. The risk of bleeding is higher with warfarin than with aspirin. You will need frequent blood tests to make sure you are taking the safest amount.  New anticoagulants. Several new drugs have been approved. They are all taken by mouth. Studies show that these drugs work as well as warfarin. They do not require blood testing. They may cause less bleeding  risk than warfarin. WHAT DO I NEED TO REMEMBER WHEN TAKING ANTICOAGULANT THERAPY? Anticoagulant therapy decreases your risk of forming a blood clot, but it increases your risk of bleeding. Work closely with your health care provider to make sure you are taking your medicine safely. These tips can help:  Learn ways to reduce your risk of bleeding.  If you are taking warfarin:  Have blood tests as ordered by your health care provider.  Do not make any sudden changes to your diet. Vitamin K in your diet can make warfarin less effective.  Do not get pregnant. This medicine may cause birth defects.  Take your medicine at the same time every day. If you forget to take your medicine, take it as soon as you remember. If you miss a whole day, do not double your dose of medicine. Take your normal dose and call your health care provider to check in.  Do not stop taking your medicine on your own.  Tell your health care provider before you start taking any new medicine, vitamin, or herbal product. Some of these could interfere with your therapy.  Tell all of your health care providers that you are on anticoagulant therapy.  Do not have surgery, medical procedures, or dental work until you tell your health care provider that you are on anticoagulant therapy. WHAT CAN AFFECT HOW ANTICOAGULANTS WORK? Certain foods, vitamins, medicines, supplements, and herbal medicines change the way that anticoagulant therapy works. They may increase or decrease the effects of your anticoagulant therapy. Either result can be dangerous for you.  Many over-the-counter medicines for pain, colds, or stomach problems interfere with anticoagulant therapy. Take these only as told by your health care provider.  Do not drink alcohol. It can interfere with your medicine and increase your risk of an injury that causes bleeding.  If you are taking warfarin, do not begin eating more foods that contain vitamin K. These include  leafy green vegetables. Ask your health care provider if you should avoid any foods. WHAT ARE SOME WAYS TO PREVENT BLEEDING? You can prevent bleeding by taking certain precautions:  Be extra careful when you use knives, scissors, or other sharp objects.  Use an electric razor instead of a blade.  Do not use toothpicks.  Use a soft toothbrush.  Wear shoes that have nonskid soles.  Use bath mats and handrails in your bathroom.  Wear gloves while you do yard work.  Wear a helmet when you ride a bike.  Wear your seat belt.  Prevent falls by removing loose rugs and extension cords from areas where you walk.  Do not play contact sports or participate in other activities that have a high risk of injury. [place] PROVIDER? Call your health care provider if:  You miss a dose of medicine:  And you are not sure what to do.  For more than one day.  You have:  Menstrual bleeding that is heavier than normal.  Blood in your urine.  A bloody nose or bleeding gums.  Easy bruising.  Blood in your stool (feces) or have black and tarry stool.  Side effects from your medicine.  You feel weak or dizzy.  You become pregnant. Seek immediate medical care if:  You have bleeding that will not stop.  You have sudden and severe headache or belly pain.  You vomit or you cough up bright red blood.  You have a severe blow to your head. WHAT ARE SOME QUESTIONS TO ASK MY HEALTH CARE PROVIDER?  What is the best anticoagulant therapy for my condition?  What side effects should I watch for?  When should I take my medicine? What should I do if I forget to take it?  Will I need to have regular blood tests?  Do I need to change my diet? Are there foods or drinks that I should avoid?  What activities are safe for me?  What should I do if I want to get pregnant? ++++++++++++++++++++++++++++++++   What You Need to Know About Warfarin Warfarin is a blood  thinner (anticoagulant). Anticoagulants help to prevent the formation of blood clots. They also help to stop the growth of blood clots. Who should use warfarin? Warfarin is prescribed for people who are at risk for developing harmful blood clots, such as people who have:  Surgically implanted mechanical heart valves.  Irregular heart rhythms (atrial fibrillation).  Certain clotting disorders.  A history of harmful blood clotting in the past. This includes people who have had:  A stroke.  Blood clot in the lungs (pulmonary embolism, or PE).  Blood clot in the legs (deep vein thrombosis, or DVT).  An existing blood clot. How is warfarin taken?   Warfarin is a medicine that you take by mouth (orally). Warfarin tablets come in different strengths. Each tablet strength is a different color, with the amount of warfarin printed on the tablet. If you get a new prescription filled and the color of your tablet is different than usual, tell your pharmacist or health care provider immediately. What  blood tests do I need while taking warfarin? The goal of warfarin therapy is to lessen the clotting tendency of blood, but not to prevent clotting completely. Your health care provider will monitor the anticoagulation effect of warfarin closely and will adjust your dose as needed. Warfarin is a medicine that needs to be closely monitored, so it is very important to keep all lab visits and follow-up visits with your health care provider. While taking warfarin, you will need to have blood tests (prothrombin tests, or PT tests) regularly to measure your blood clotting time. This type of test can be done with a finger stick or a blood draw. What does the INR test result mean? The PT test results will be reported as the International Normalized Ratio (INR). The INR tells your health care provider whether your dosage of warfarin needs to be changed. The longer it takes your blood to clot, the higher the INR. Your  health care provider will tell you your target INR range. If your INR is not in your target range, your health care provider may adjust your dosage.  If your INR is above your target range, there is a risk of bleeding. Your dosage of warfarin may need to be decreased.  If your INR is below your target range, there is a risk of clotting. Your dosage of warfarin may need to be increased. How often is the INR test needed?  When you first start warfarin, you will usually have your INR checked every few days.  You may need to have INR tests done more than once a week until you are taking the correct dosage of warfarin.  After you have reached your target INR, your INR will be tested less often. However, you will need to have your INR checked at least once every 4-6 weeks for the entire time you are taking warfarin. What are the side effects of warfarin? Too much warfarin can cause bleeding (hemorrhage) in any part of the body, such as:  Bleeding from the gums.  Unexplained bruises.  Bruises that get larger.  Blood in the urine.  Bloody or dark stools.  Bleeding in the brain (hemorrhagic stroke).  A nosebleed that is not easily stopped.  Coughing up blood.  Vomiting blood. Warfarin use may also cause:  Skin rash or irritations  Nausea that does not go away.  Severe pain in the back or joints.  Painful toes that turn blue or purple (purple toe syndrome).  Painful ulcers that do not go away (skin necrosis). What are the signs and symptoms of a blood clot? Too little warfarin can increase the risk of blood clots in your legs, lungs, or arms. Signs and symptoms of a DVT in your leg or arm may include:  Pain or swelling in your leg or arm.  Skin that is red or warm to the touch on your arm or leg. Signs and symptoms of a pulmonary embolism may include:  Shortness of breath or difficulty breathing.  Chest pain.  Unexplained fever. What are the signs and symptoms of a  stroke? If you are taking too much or too little warfarin, you can have a stroke. Signs and symptoms of a stroke may include:  Weakness or numbness of your face, arm, or leg, especially on one side of your body.  Confusion or trouble thinking clearly.  Difficulty seeing with one or both eyes.  Difficulty walking or moving your arms or legs.  Dizziness.  Loss of balance or coordination.  Trouble speaking, trouble understanding speech, or both (aphasia).  Sudden, severe headache with no known cause.  Partial or total loss of consciousness. What precautions do I need to take while using warfarin?   Take warfarin exactly as told by your health care provider. Doing this helps you avoid bleeding or blood clots that could result in serious injury, pain, or disability.  Take your medicine at the same time every day. If you forget to take your dose of warfarin, take it as soon as you remember that day. If you do not remember on that day, do not take an extra dose the next day.  Contact your health care provider if you miss or take an extra dose. Do not change your dosage on your own to make up for missed or extra doses.  Wear or carry identification that says that you are taking warfarin.  Make sure that all health care providers, including your dentist, know you are taking warfarin.  If you need surgery, talk with your health care provider about whether you should stop taking warfarin before your surgery.  Avoid situations that cause bleeding. You may bleed more easily while taking warfarin. To limit bleeding, take the following actions:  Use a softer toothbrush.  Floss with waxed floss, not unwaxed floss.  Shave with an electric razor, not with a blade.  Limit your use of sharp objects.  Avoid potentially harmful activities, such as contact sports. What do I need to know about warfarin and pregnancy or breastfeeding?  Warfarin is not recommended during the first trimester of  pregnancy due to an increased risk of birth defects. In certain situations, a woman may take warfarin after her first trimester of pregnancy.  If you are taking warfarin and you become pregnant or plan to become pregnant, contact your health care provider right away.  If you plan to breastfeed while taking warfarin, talk with your health care provider first. What do I need to know about warfarin and alcohol or drug use?  Avoid drinking alcohol, or limit alcohol intake to no more than 1 drink a day for nonpregnant women and 2 drinks a day for men. One drink equals 12 oz of beer, 5 oz of wine, or 1 oz of hard liquor.  If you change the amount of alcohol that you drink, tell your health care provider. Your warfarin dosage may need to be changed.  Avoid tobacco products, such as cigarettes, chewing tobacco, and e-cigarettes. If you need help quitting, ask your health care provider.  If you change the amount of nicotine or tobacco that you use, tell your health care provider. Your warfarin dosage may need to be changed.  Avoid street drugs while taking warfarin. The effects of street drugs on warfarin are not known. What do I need to know about warfarin and other medicines or supplements?  Many prescription and over-the-counter medicines can interfere with warfarin. Talk with your health care provider or your pharmacist before starting or stopping any new medicines. This includes over-the-counter vitamins, dietary supplements, herbal medicines, and pain medicines. Your warfarin dosage may need to be adjusted.  Some common over-the-counter medicines that may increase the risk of bleeding while taking warfarin include:  Acetaminophen.  Aspirin.  NSAIDs, such as ibuprofen or naproxen.  Vitamin E. What do I need to know about warfarin and my diet?  It is important to maintain a normal, balanced diet while taking warfarin. Avoid major changes in your diet. If you are going to change your  diet,  talk with your health care provider before making changes.  Your health care provider may recommend that you work with a diet and nutrition specialist (dietitian).  Vitamin K decreases the effect of warfarin, and it is found in many foods. Eat a consistent amount of foods that contain vitamin K. For example, you may decide to eat 2 vitamin K-containing foods each day. Most foods that are high in vitamin K are green and leafy. Common foods that contain high amounts of vitamin K include:  Kale, raw or cooked.  Spinach, raw or cooked.  Collards, raw or cooked.  Swiss chard, raw or cooked.  Mustard greens, raw or cooked.  Turnip greens, raw or cooked.  Parsley, raw.  Broccoli, cooked.  Noodles, eggs, and spinach, enriched.  Brussels sprouts, raw or cooked.  Beet greens, raw or cooked.  Endive, raw.  Cabbage, cooked.  Asparagus, cooked. Foods that contain moderate amounts of vitamin K include:  Broccoli, raw.  Cabbage, raw.  Bok choy, cooked.  Green leaf lettuce, raw  Prunes, stewed.  Angie Fava.  Kiwi.  Edamame, cooked.  Romaine lettuce, raw.  Avocado.  Tuna, canned in oil.  Okra, cooked.  Black-eyed peas, cooked.  Green beans, cooked or raw.  Blueberries, raw.  Blackberries, raw.  Peas, cooked or raw. Contact a health care provider if:  You miss a dose.  You take an extra dose.  You plan to have any kind of surgery or procedure.  You are unable to take your medicine due to nausea, vomiting, or diarrhea.  You have any major changes in your diet or you plan to make any major changes in your diet.  You start or stop any over-the-counter medicine, prescription medicine, or dietary supplement.  You become pregnant, plan to become pregnant, or think you may be pregnant.  You have menstrual periods that are heavier than usual.  You have unusual bruising. Get help right away if:  You develop symptoms of an allergic reaction, such  as:  Swelling of the lips, face, tongue, mouth, or throat.  Rash.  Itching.  Itchy, red, swollen areas of skin (hives).  Trouble breathing.  Chest tightness.  You have:  Signs or symptoms of a stroke.  Signs or symptoms of a blood clot.  A fall or have an accident, especially if you hit your head.  Blood in your urine. Your urine may look reddish, pinkish, or tea-colored.  Blood in your stool. Your stool may be black or bright red.  Bleeding that does not stop after applying pressure to the area for 30 minutes.  Severe pain in your joints or back.  Purple or blue toes.  Skin ulcers that do not go away.  You vomit blood or cough up blood. The blood may be bright red, or it may look like coffee grounds. These symptoms may represent a serious problem that is an emergency. Do not wait to see if the symptoms will go away. Get medical help right away. Call your local emergency services (911 in the [place] Do not drive yourself to the hospital.  Summary  Warfarin needs to be closely monitored with blood tests. It is very important to keep all lab visits and follow-up visits with your health care provider.  Make sure that you know your target INR range and your warfarin dosage.  Wear or carry identification that says that you are taking warfarin.  Take warfarin at the same time every day. Call your health care provider if you miss  a dose or if you take an extra dose. Do not change the dosage of warfarin on your own.  Know the signs and symptoms of blood clots, bleeding, and a stroke. Know when to get emergency medical help.  Tell all health care providers who care for you that you are taking warfarin.  Talk with your health care provider or your pharmacist before starting or stopping any new medicines. ++++++++++++++++++++++++++++++   Chronic Obstructive Pulmonary Disease Exacerbation Chronic obstructive pulmonary disease (COPD) is a common lung problem. In COPD, the  flow of air from the lungs is limited. COPD exacerbations are times that breathing gets worse and you need extra treatment. Without treatment they can be life threatening. If they happen often, your lungs can become more damaged. If your COPD gets worse, your doctor may treat you with:  Medicines.  Oxygen.  Different ways to clear your airway, such as using a mask. Follow these instructions at home:  Do not smoke.  Avoid tobacco smoke and other things that bother your lungs.  If given, take your antibiotic medicine as told. Finish the medicine even if you start to feel better.  Only take medicines as told by your doctor.  Drink enough fluids to keep your pee (urine) clear or pale yellow (unless your doctor has told you not to).  Use a cool mist machine (vaporizer).  If you use oxygen or a machine that turns liquid medicine into a mist (nebulizer), continue to use them as told.  Keep up with shots (vaccinations) as told by your doctor.  Exercise regularly.  Eat healthy foods.  Keep all doctor visits as told. Get help right away if:  You are very short of breath and it gets worse.  You have trouble talking.  You have bad chest pain.  You have blood in your spit (sputum).  You have a fever.  You keep throwing up (vomiting).  You feel weak, or you pass out (faint).  You feel confused.  You keep getting worse. ++++++++++++++++++++++++++++++++++++ Chronic Obstructive Pulmonary Disease Chronic obstructive pulmonary disease (COPD) is a common lung condition in which airflow from the lungs is limited. COPD is a general term that can be used to describe many different lung problems that limit airflow, including both chronic bronchitis and emphysema. If you have COPD, your lung function will probably never return to normal, but there are measures you can take to improve lung function and make yourself feel better. What are the causes?  Smoking (common).  Exposure to  secondhand smoke.  Genetic problems.  Chronic inflammatory lung diseases or recurrent infections. What are the signs or symptoms?  Shortness of breath, especially with physical activity.  Deep, persistent (chronic) cough with a large amount of thick mucus.  Wheezing.  Rapid breaths (tachypnea).  Gray or bluish discoloration (cyanosis) of the skin, especially in your fingers, toes, or lips.  Fatigue.  Weight loss.  Frequent infections or episodes when breathing symptoms become much worse (exacerbations).  Chest tightness. How is this diagnosed? Your health care provider will take a medical history and perform a physical examination to diagnose COPD. Additional tests for COPD may include:  Lung (pulmonary) function tests.  Chest X-ray.  CT scan.  Blood tests. How is this treated? Treatment for COPD may include:  Inhaler and nebulizer medicines. These help manage the symptoms of COPD and make your breathing more comfortable.  Supplemental oxygen. Supplemental oxygen is only helpful if you have a low oxygen level in your blood.  Exercise and physical activity. These are beneficial for nearly all people with COPD.  Lung surgery or transplant.  Nutrition therapy to gain weight, if you are underweight.  Pulmonary rehabilitation. This may involve working with a team of health care providers and specialists, such as respiratory, occupational, and physical therapists. Follow these instructions at home:  Take all medicines (inhaled or pills) as directed by your health care provider.  Avoid over-the-counter medicines or cough syrups that dry up your airway (such as antihistamines) and slow down the elimination of secretions unless instructed otherwise by your health care provider.  If you are a smoker, the most important thing that you can do is stop smoking. Continuing to smoke will cause further lung damage and breathing trouble. Ask your health care provider for help with  quitting smoking. He or she can direct you to community resources or hospitals that provide support.  Avoid exposure to irritants such as smoke, chemicals, and fumes that aggravate your breathing.  Use oxygen therapy and pulmonary rehabilitation if directed by your health care provider. If you require home oxygen therapy, ask your health care provider whether you should purchase a pulse oximeter to measure your oxygen level at home.  Avoid contact with individuals who have a contagious illness.  Avoid extreme temperature and humidity changes.  Eat healthy foods. Eating smaller, more frequent meals and resting before meals may help you maintain your strength.  Stay active, but balance activity with periods of rest. Exercise and physical activity will help you maintain your ability to do things you want to do.  Preventing infection and hospitalization is very important when you have COPD. Make sure to receive all the vaccines your health care provider recommends, especially the pneumococcal and influenza vaccines. Ask your health care provider whether you need a pneumonia vaccine.  Learn and use relaxation techniques to manage stress.  Learn and use controlled breathing techniques as directed by your health care provider. Controlled breathing techniques include: 1. Pursed lip breathing. Start by breathing in (inhaling) through your nose for 1 second. Then, purse your lips as if you were going to whistle and breathe out (exhale) through the pursed lips for 2 seconds. 2. Diaphragmatic breathing. Start by putting one hand on your abdomen just above your waist. Inhale slowly through your nose. The hand on your abdomen should move out. Then purse your lips and exhale slowly. You should be able to feel the hand on your abdomen moving in as you exhale.  Learn and use controlled coughing to clear mucus from your lungs. Controlled coughing is a series of short, progressive coughs. The steps of controlled  coughing are: 1. Lean your head slightly forward. 2. Breathe in deeply using diaphragmatic breathing. 3. Try to hold your breath for 3 seconds. 4. Keep your mouth slightly open while coughing twice. 5. Spit any mucus out into a tissue. 6. Rest and repeat the steps once or twice as needed. Contact a health care provider if:  You are coughing up more mucus than usual.  There is a change in the color or thickness of your mucus.  Your breathing is more labored than usual.  Your breathing is faster than usual. Get help right away if:  You have shortness of breath while you are resting.  You have shortness of breath that prevents you from:  Being able to talk.  Performing your usual physical activities.  You have chest pain lasting longer than 5 minutes.  Your skin color is more cyanotic  than usual.  You measure low oxygen saturations for longer than 5 minutes with a pulse oximeter. This information is not intended to replace advice given to you by your health care provider. Make sure you discuss any questions you have with your health care provider. Document Released: 06/12/2005 Document Revised: 02/08/2016 Document Reviewed: 04/29/2013 [employer] Interactive Patient Education  2017 [employer].

## 2016-08-13 NOTE — Progress Notes (Signed)
[place] [facility] Unk Pinto, M.D.        Uvaldo Bristle. Silverio Lay, P.A.-C       Starlyn Skeans, P.A.-C  [facility]                [address] [place] [id number] Telephone [phone] Telefax [phone] ______________________________________________________________________     This very nice 70 y.o. MWF presents for post hospital f/u  follow up [facility] complicating her severe O2 dependent End Stage COPD and is also followed for HTN, HLD, T2_NIDDM, GERD, Hypothyroidism and Vitamin D Deficiency. GERD is controlled with her meds and Hypothyroidism has been compensated.      Patient was hospitalized 11/15-11/26/2017 with impending respiratory failure and hypercarbia and also had new onset Afib. And was initiated on Coumadin. Patient slowly improved & was d/c'd for out-pt f/u.  Currently she is back on Nasal O2 with O2 sats running about 93-95%. Patient is poorly compliant with her CPAP for her OSA. Likewise patient is poorly compliant as she continues to smoke heavily. Hospital discharge meds were reviewed, and reconciled with patient and her son in attendance.      Patient is treated for HTN & BP has been controlled at home. Today's BP: 110/66. Patient has had no complaints of any cardiac type chest pain, palpitations, dyspnea/orthopnea/PND, dizziness, claudication, or dependent edema.     Hyperlipidemia is controlled with diet & meds. Patient denies myalgias or other med SE's. Last Lipids were at goal albeit sl. 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  Last A1c was at goal: Lab Results   Component Value Date   HGBA1C 5.2 06/06/2016      Further, the patient also has history of Vitamin D Deficiency in 2009 of "9" and supplements vitamin D without any suspected side-effects. Last vitamin D was low:  Lab Results  Component Value Date   VD25OH 45 02/29/2016   Current Outpatient Prescriptions on File Prior to Visit  Medication Sig  . acetaminophen-codeine (TYLENOL #3) 300-30 MG per tablet Take 1 tablet by mouth every 8 (eight) hours as needed for moderate pain or severe pain.  Marland Kitchen albuterol (PROVENTIL HFA;VENTOLIN HFA) 108 (90 Base) MCG/ACT inhaler Inhale 1-2 puffs into the lungs every 6 (six) hours as needed for wheezing or shortness of breath.  Marland Kitchen aspirin 81 MG tablet Take 81 mg by mouth daily.  . budesonide-formoterol (SYMBICORT) 160-4.5 MCG/ACT inhaler 2 puffs twice a day, wash mouth afterwards  . Cholecalciferol (VITAMIN D PO) Take 2,000 Units by mouth daily.   Marland Kitchen diltiazem (CARDIZEM CD) 300 MG 24 hr capsule Take 1 capsule (300 mg total) by mouth daily.  . DULoxetine (CYMBALTA) 60 MG capsule TAKE 1 CAPSULE BY MOUTH DAILY  . famotidine (PEPCID) 20 MG tablet TAKE 1 TABLET (20 MG TOTAL) BY MOUTH 2 (TWO) TIMES DAILY. FOR ACID REFLUX  . guaiFENesin (MUCINEX) 600 MG 12 hr tablet Take 600 mg by mouth daily.  Marland Kitchen levothyroxine (SYNTHROID, LEVOTHROID) 175 MCG tablet Take 1 tablet (175 mcg total) by  mouth daily before breakfast.  . loratadine-pseudoephedrine (CLARITIN-D 24-HOUR) 10-240 MG per 24 hr tablet Take 1 tablet by mouth as needed for allergies.   . Magnesium Chloride (MAGNESIUM DR PO) Take 1 capsule by mouth daily.   . metFORMIN (GLUCOPHAGE-XR) 500 MG 24 hr tablet TAKE 1 TABLET (500 MG TOTAL) BY MOUTH 4 (FOUR) TIMES DAILY - AFTER MEALS AND AT BEDTIME.  . metoprolol tartrate (LOPRESSOR) 25 MG tablet Take 1 tablet (25 mg total) by mouth 2 (two) times daily.  . OXYGEN-HELIUM IN Inhale 5 L into the lungs.   . pravastatin (PRAVACHOL) 40 MG tablet TAKE 1 TABLET (40 MG TOTAL) BY MOUTH  DAILY.  Marland Kitchen predniSONE (STERAPRED UNI-PAK 21 TAB) 10 MG (21) TBPK tablet Take 4 pills a day for 6 days Take 3 pills a day for 5 days  Take 2 pills a day for 4 days  Take 1 pill a day for 6 days  . warfarin (COUMADIN) 7.5 MG tablet Take 1 tablet (7.5 mg total) by mouth one time only at 6 PM.   No current facility-administered medications on file prior to visit.    Allergies  Allergen Reactions  . Inderal [Propranolol] Other (See Comments)    Hair loss  . Ace Inhibitors Cough   PMHx:   Past Medical History:  Diagnosis Date  . Arthritis   . COPD (chronic obstructive pulmonary disease) ([place])   . Depression   . Diabetes mellitus type 2 in obese ([place])   . GERD (gastroesophageal reflux disease)   . Hyperlipidemia   . Hypertension   . Morbid obesity ([place])   . OAB (overactive bladder)   . PSVT (paroxysmal supraventricular tachycardia) ([place])   . Thyroid disease   . Vitamin D deficiency    Immunization History  Administered Date(s) Administered  . Influenza, High Dose Seasonal PF 08/16/2014, 07/27/2015, 06/06/2016  . Pneumococcal Conjugate-13 07/27/2015  . Pneumococcal-Unspecified 05/16/2011  . Tdap 08/16/2008   Past Surgical History:  Procedure Laterality Date  . CARPAL TUNNEL RELEASE     L 1992 R 1984  . EYE SURGERY Bilateral    cataract  . NM MYOVIEW LTD  01/2011   Dobutamine Myoview: Negative perfusion scan for ischemia / infarct (poor image capture); Pt developed SVT with Dobutamine that reproduced her CP as it resolved with restoration of NSR.  Marland Kitchen PUBOVAGINAL SLING    . TONSILLECTOMY AND ADENOIDECTOMY    . TRANSTHORACIC ECHOCARDIOGRAM  01/2011   Hyperdynamic LV with EF 65-70%, Gr 1 DD, Mild Ao Stenosis - mean gradient ~19 mmHg   FHx:    Reviewed / unchanged  SHx:    Reviewed / unchanged  Systems Review:  Constitutional: Denies fever, chills, wt changes, headaches, insomnia, fatigue, night sweats, change in appetite. Eyes: Denies redness, blurred vision, diplopia,  discharge, itchy, watery eyes.  ENT: Denies discharge, congestion, post nasal drip, epistaxis, sore throat, earache, hearing loss, dental pain, tinnitus, vertigo, sinus pain, snoring.  CV: Denies chest pain, palpitations, irregular heartbeat, syncope, dyspnea, diaphoresis, orthopnea, PND, claudication or edema. Respiratory: denies cough, dyspnea, DOE, pleurisy, hoarseness, laryngitis, wheezing.  Gastrointestinal: Denies dysphagia, odynophagia, heartburn, reflux, water brash, abdominal pain or cramps, nausea, vomiting, bloating, diarrhea, constipation, hematemesis, melena, hematochezia  or hemorrhoids. Genitourinary: Denies dysuria, frequency, urgency, nocturia, hesitancy, discharge, hematuria or flank pain. Musculoskeletal: Denies arthralgias, myalgias, stiffness, jt. swelling, pain, limping or strain/sprain.  Skin: Denies pruritus, rash, hives, warts, acne, eczema or change in skin lesion(s). Neuro: No weakness, tremor, incoordination, spasms, paresthesia or pain. Psychiatric: Denies  confusion, memory loss or sensory loss. Endo: Denies change in weight, skin or hair change.  Heme/Lymph: No excessive bleeding, bruising or enlarged lymph nodes.  Physical Exam BP 110/66   Pulse 80   Temp 97 F (36.1 C)   Resp 16   Ht 5' 4.5" (1.638 m)   Appears Morbidly Obese and unkempt on nasal O2 and in no distress.  Eyes: PERRLA, EOMs, conjunctiva no swelling or erythema. Sinuses: No frontal/maxillary tenderness ENT/Mouth: EAC's clear, TM's nl w/o erythema, bulging. Nares clear w/o erythema, swelling, exudates. Oropharynx clear without erythema or exudates. Oral hygiene is good. Tongue normal, non obstructing. Hearing intact.  Neck: Supple. Thyroid nl. Car 2+/2+ without bruits, nodes or JVD. Chest: Respirations nl with BS distant due to chest wall thickness w/o rales, rhonchi, wheezing or stridor.  Cor: Heart sounds soft w/ regular rate and rhythm without sig. murmurs, gallops, clicks, or rubs.  Peripheral pulses 1+ and equal  With 1+ ankle edema.  Abdomen: Soft, rotund  & bowel sounds normal. Non-tender w/o guarding, rebound, hernias, masses, or organomegaly.  Lymphatics: Unremarkable.  Musculoskeletal: Full ROM with generalized decrease in muscle power, tone and bulk  and  In a dbl-wide W/C.  Skin: Warm, dry without exposed rashes, lesions or ecchymosis apparent.  Neuro: Cranial nerves intact, reflexes equal bilaterally. Sensory-motor testing grossly intact. Tendon reflexes depressed to absent.  Pysch: Alert & oriented x 3.  Insight and judgement nl & appropriate. No ideations.  Assessment and Plan:   1. COPD exacerbation ([place])   2. Chronic respiratory insufficiency   3. Paroxysmal atrial fibrillation (HCC)  - Protime-INR  4. Type 2 diabetes mellitus with stage 3 chronic kidney disease, without long-term current use of insulin (HCC)  - Continue medication, monitor blood pressure and CBG's at home.  - Continue DASH diet. Reminder to go to the ER if any CP, worsening  SOB, nausea, dizziness, severe HA, changes vision/speech,  left arm numbness and tingling and jaw pain. - Continue diet/meds, exercise   5. Long term current use of anticoagulant therapy  - Protime-INR - Monitor appropriate labs.  6. Medication management  - CBC with Differential/Platelet - BASIC METABOLIC PANEL WITH GFR  7. Pulmonary emphysema, unspecified emphysema type (HCC)  - acetaZOLAMIDE (DIAMOX) 125 MG tablet; Take 1 tablet 3 x/ day  Dispense: 270 tablet; Refill: 1       Recommended regular exercise, BP monitoring, weight control, and discussed med and SE's. Recommended labs to assess and monitor clinical status. Further disposition pending results of labs. Over 45 minutes of exam, counseling, chart review and medication reconciliation was performed

## 2016-08-14 DIAGNOSIS — J449 Chronic obstructive pulmonary disease, unspecified: Secondary | ICD-10-CM | POA: Diagnosis not present

## 2016-08-14 LAB — CBC WITH DIFFERENTIAL/PLATELET
BASOS ABS: 0 {cells}/uL (ref 0–200)
Basophils Relative: 0 %
EOS ABS: 0 {cells}/uL — AB (ref 15–500)
Eosinophils Relative: 0 %
HEMATOCRIT: 38.4 % (ref 35.0–45.0)
HEMOGLOBIN: 10.9 g/dL — AB (ref 11.7–15.5)
LYMPHS ABS: 495 {cells}/uL — AB (ref 850–3900)
LYMPHS PCT: 5 %
MCH: 23.7 pg — AB (ref 27.0–33.0)
MCHC: 28.4 g/dL — AB (ref 32.0–36.0)
MCV: 83.7 fL (ref 80.0–100.0)
MONO ABS: 198 {cells}/uL — AB (ref 200–950)
MPV: 9.5 fL (ref 7.5–12.5)
Monocytes Relative: 2 %
NEUTROS PCT: 93 %
Neutro Abs: 9207 cells/uL — ABNORMAL HIGH (ref 1500–7800)
Platelets: 278 10*3/uL (ref 140–400)
RBC: 4.59 MIL/uL (ref 3.80–5.10)
RDW: 15.6 % — ABNORMAL HIGH (ref 11.0–15.0)
WBC: 9.9 10*3/uL (ref 3.8–10.8)

## 2016-08-14 LAB — PROTIME-INR
INR: 3.8 — AB
Prothrombin Time: 38.5 s — ABNORMAL HIGH (ref 9.0–11.5)

## 2016-08-14 LAB — BASIC METABOLIC PANEL WITH GFR
BUN: 22 mg/dL (ref 7–25)
CALCIUM: 8.9 mg/dL (ref 8.6–10.4)
CHLORIDE: 99 mmol/L (ref 98–110)
CO2: 33 mmol/L — AB (ref 20–31)
CREATININE: 0.62 mg/dL (ref 0.60–0.93)
GFR, Est African American: >89 mL/min (ref >=60)
Glucose, Bld: 156 mg/dL — ABNORMAL HIGH (ref 65–99)
Potassium: 5.1 mmol/L (ref 3.5–5.3)
SODIUM: 140 mmol/L (ref 135–146)

## 2016-08-15 ENCOUNTER — Ambulatory Visit: Payer: PPO | Admitting: Physician Assistant

## 2016-08-18 DIAGNOSIS — J449 Chronic obstructive pulmonary disease, unspecified: Secondary | ICD-10-CM | POA: Diagnosis not present

## 2016-08-23 ENCOUNTER — Encounter: Payer: Self-pay | Admitting: Internal Medicine

## 2016-08-23 ENCOUNTER — Other Ambulatory Visit: Payer: Self-pay | Admitting: Internal Medicine

## 2016-08-23 ENCOUNTER — Ambulatory Visit (INDEPENDENT_AMBULATORY_CARE_PROVIDER_SITE_OTHER): Payer: PPO | Admitting: Internal Medicine

## 2016-08-23 VITALS — BP 132/82 | HR 96 | Temp 97.6°F | Ht 64.5 in | Wt 250.1 lb

## 2016-08-23 DIAGNOSIS — Z79899 Other long term (current) drug therapy: Secondary | ICD-10-CM | POA: Diagnosis not present

## 2016-08-23 DIAGNOSIS — Z7901 Long term (current) use of anticoagulants: Secondary | ICD-10-CM | POA: Diagnosis not present

## 2016-08-23 DIAGNOSIS — I480 Paroxysmal atrial fibrillation: Secondary | ICD-10-CM | POA: Diagnosis not present

## 2016-08-23 DIAGNOSIS — I10 Essential (primary) hypertension: Secondary | ICD-10-CM

## 2016-08-23 DIAGNOSIS — J449 Chronic obstructive pulmonary disease, unspecified: Secondary | ICD-10-CM | POA: Diagnosis not present

## 2016-08-23 LAB — BASIC METABOLIC PANEL WITH GFR
BUN: 23 mg/dL (ref 7–25)
CALCIUM: 8.8 mg/dL (ref 8.6–10.4)
CO2: 43 mmol/L — ABNORMAL HIGH (ref 20–31)
Chloride: 96 mmol/L — ABNORMAL LOW (ref 98–110)
Creat: 0.74 mg/dL (ref 0.60–0.93)
GFR, Est Non African American: 82 mL/min (ref >=60)
GLUCOSE: 109 mg/dL — AB (ref 65–99)
Potassium: 4.2 mmol/L (ref 3.5–5.3)
Sodium: 141 mmol/L (ref 135–146)

## 2016-08-23 LAB — PROTIME-INR
INR: 1
Prothrombin Time: 10.3 s (ref 9.0–11.5)

## 2016-08-23 NOTE — Progress Notes (Signed)
[place] [facility]   Unk Pinto, M.D.    Uvaldo Bristle. Silverio Lay, P.A.-C      Starlyn Skeans, P.A.-C  [facility]                [address] [place], [place] [id number] Telephone [phone] Telefax [phone] Subjective:    Patient ID: Jillian Wood, female    DOB: 1946-07-27, 70 y.o.   MRN: [id number]  HPI  This 70 yo MWF with multiple severe co-morbidities including End Stage O2 Dep COPD was recently hospitalized Nov 15-26 with acute on chronic Respiratory failure and hypercarbia had new onset Afib and was initiated on Coumadin.     Patient had been on Coumadin 7.5 mg tab x 5 days and on Nov 28 her PT/INR returned high at 3.8x and she was advised to hold coumadin x 5 days and then resume on Dec 3 by taking 1/2 tab = 3.75 mg/daily for last last 5 days.    Denies issues with bleeding/bruising. Patient continues on supplemental O2 at up to 5 lit to maintain her O2 sats  Despite having been told repeatedly in the past to titrate supplemental O2 to keep O2 sats at or below 90-92 because of her hypercarbia for which she's also on Diamox.   Medication Sig  . TYLENOL #3 Take 1 tab every 8  hours as needed  . acetaZOLAMIDE 125 MG tablet Take 1 tablet 3 x/ day  . albuterolHFA inhaler 1-2 puffs  every 6  hrs as needed  . aspirin 81 MG  Take  daily.  . SYMBICORT 160-4.5 2 puffs twice a day  . VITAMIN D Take 2,000 Units  daily.   Marland Kitchen diltiazemCD 300 MG 24 hr Take 1 cap daily.  . DULoxetine 60 MG  TAKE 1 CAP DAILY  . famotidine  20 MG  TAKE 1 TAB 2  TIMES DAILY  . guaiFENesin 600 MG 12 hr  Take daily.  Marland Kitchen levothyroxine  175 MCG  Take 1 tab daily before breakfast.  . CLARITIN-D 24-HR 10-240 MG Take 1 tab as needed for allergies.   . Magnesium  Take 1 capsule  daily.   . metFORMIN-XR  500 MG  TAKE 1 TAB 4  TIMES DAILY - AFTER MEALS AND AT BEDTIME.  . metoprolol tartrate  25 MG  Take 1 tab 2  times daily.  .  OXYGEN-HELIUM IN Inhale 5 L into the lungs.   . pravastatin  40 MG tablet TAKE 1 TABLET (40 MG TOTAL) BY MOUTH DAILY.  Marland Kitchen warfarin  7.5 MG tablet Take 1 tablet (7.5 mg total) by mouth one time only at 6 PM.   Allergies  Allergen Reactions  . Inderal [Propranolol] Other (See Comments)    Hair loss  . Ace Inhibitors Cough   Past Medical History:  Diagnosis Date  . Arthritis   . COPD (chronic obstructive pulmonary disease) ([place])   . Depression   . Diabetes mellitus type 2 in obese ([place])   . GERD (gastroesophageal reflux disease)   . Hyperlipidemia   . Hypertension   . Morbid obesity ([place])   . OAB (overactive bladder)   . PSVT (paroxysmal supraventricular tachycardia) ([place])   . Thyroid disease   . Vitamin D deficiency    Review of Systems  10 point systems review negative except as above.    Objective:   Physical  Exam  BP 132/82   Pulse 96   Temp 97.6 F (36.4 C)   Ht 5' 4.5" (1.638 m)   Wt 250 lb 1.6 oz (113.4 kg)   BMI 42.27 kg/m   O2 sat was 98 % on 5 lit O2 and patient was advised to decrease to 2-3 to titrate O2 sat to 90-92 % to avoid CO2 narcosis.   Morbidly obese elderly chronically WF who reeks of tobacco  HEENT - Eac's patent. TM's Nl. EOM's full. PERRLA. NasoOroPharynx clear. Neck - supple. Nl Thyroid. Carotids 2+ & No bruits, nodes, JVD Chest -  Decreased BS  From chest wall  thickness. Cor - soft HS.  Sl irRR w/o sig M. PP 1(+) with 1 + pretibial edema. Abd - soft , rotund w/o masses or tenderness MS- FROM w/o deformities. Muscle power, tone and bulk decreased.  In a W/C.  Neuro -  Nl w/o focal abnormalities.    Assessment & Plan:   1. Paroxysmal atrial fibrillation (HCC)  - Protime-INR stat was 10.3 sec with INR 1.0. Patient was advised to increase to 1 whole tab (7.5 mg_) for nes=xt 5 days  Then on Tue 13th Dec to begin 1/2 tab (3.75 mg) qod - odd days alternating with 1 whole tab (7.5 mg) qod - even days and then return on Fri Dec 22sd for a NV for  stat PT/INR. - Discussed meds/SE's and patient is very familiar with Coumadin as her husband has been on Coumadin for years.   2. Essential hypertension   3. Chronic obstructive pulmonary disease, unspecified COPD type ([place])   4. Long term current use of anticoagulant therapy  - Protime-INR  5. Medication management  - BASIC METABOLIC PANEL WITH GFR

## 2016-08-24 ENCOUNTER — Other Ambulatory Visit: Payer: Self-pay | Admitting: Internal Medicine

## 2016-08-28 ENCOUNTER — Telehealth: Payer: Self-pay | Admitting: *Deleted

## 2016-08-28 ENCOUNTER — Ambulatory Visit (INDEPENDENT_AMBULATORY_CARE_PROVIDER_SITE_OTHER): Payer: PPO | Admitting: Physician Assistant

## 2016-08-28 ENCOUNTER — Encounter: Payer: Self-pay | Admitting: Physician Assistant

## 2016-08-28 VITALS — BP 112/76 | HR 90 | Ht 66.5 in | Wt 261.0 lb

## 2016-08-28 DIAGNOSIS — I5033 Acute on chronic diastolic (congestive) heart failure: Secondary | ICD-10-CM

## 2016-08-28 DIAGNOSIS — I4892 Unspecified atrial flutter: Secondary | ICD-10-CM | POA: Diagnosis not present

## 2016-08-28 DIAGNOSIS — Z7901 Long term (current) use of anticoagulants: Secondary | ICD-10-CM | POA: Diagnosis not present

## 2016-08-28 MED ORDER — FUROSEMIDE 20 MG PO TABS: 20.0000 mg | ORAL_TABLET | Freq: Every day | ORAL | 3 refills | Status: DC

## 2016-08-28 NOTE — Patient Instructions (Signed)
Medication Instructions:  START FUROSEMIDE 20MG  DAILY  LOG DAILY WEIGHTS  If you need a refill on your cardiac medications before your next appointment, please call your pharmacy.  Labwork: NONE  Testing/Procedures: NONE  Follow-Up: AS SCHEDULED IN January WITH DR BERRY  Any Other Special Instructions Will Be Listed Below (If Applicable). FOLLOW 2000MG  DAILY SODIUM DIET  DR Melford Aase LABS DONE ON Friday WE WILL REVIEW THEM TO SEE IF POTASSIUM SUPPLEMENT IS NECESSARY. WILL CALL AND LET YOU KNOW WHEN NEXT LABS ARE DUE .   HAPPY HOLIDAYS!   Thank you for choosing [facility]!!

## 2016-08-28 NOTE — Telephone Encounter (Signed)
Pt aware of lab results and instructed to reduce her O2 to try to keep her O2 sat at 90 and continue the Diamox.

## 2016-08-28 NOTE — Progress Notes (Signed)
Cardiology Office Note   Date:  08/28/2016   ID:  Jillian Wood, DOB [birth year], MRN [id number]  PCP:  Alesia Richards, MD  Cardiologist:  Dr Gwenlyn Found  EP: Dr Brayton Mars, PA-C    History of Present Illness: Jillian Wood is a 70 y.o. female with a history of HTN, PSVT, chest pain, COPD on O2, tob use, DM, HLD, GERD, chronic D-CHF, AS  Admit 11/15-11/26 with COPD exacerbation, cards saw for atria flutter, on metoprolol and Cardizem, coumadin added. CHA2DS2VASc=5 (age x 1, female, DM, HTN, CHF). Echo results below, EF nl, PAS elevated, AS mild  Jillian Wood presents for post-hospital follow up. She is here with her youngest son.  Since discharge from the hospital, she has not felt well. Dr Melford Aase wants to decrease her O2 to 3 lpm (from 5 lpm) to keep her sats at about 90%. She is struggling with this, her DOE is bad and her back pain bothers her.    She feels very weak, she is not walking much. She got better during her hospitalization, but struggles at times to function at home. She does not want PT to come to the house.   She is taking Diamox daily which helps the fluid, and takes the Lasix prn. However, her prescription is old. The last dose was before her last admission.   Dr Melford Aase follows her coumadin.   She has nocturia, denies PND. She snores. She cannot lie flat because of back problems, does not think it is due to her breathing. She has daily LE edema and is aware that her weight is going up, not sure why. She and her son believe they are not getting excess salt. She agrees that she should breathe better with less fluid. However, she holds her Diamox on days when she has appointments, also believes some of her edema is from that.    Past Medical History:  Diagnosis Date  . Arthritis   . COPD (chronic obstructive pulmonary disease) ([place])   . Depression   . Diabetes mellitus type 2 in obese ([place])   . GERD (gastroesophageal reflux disease)   .  Hyperlipidemia   . Hypertension   . Morbid obesity ([place])   . OAB (overactive bladder)   . PSVT (paroxysmal supraventricular tachycardia) ([place])   . Thyroid disease   . Vitamin D deficiency     Past Surgical History:  Procedure Laterality Date  . CARPAL TUNNEL RELEASE     L 1992 R 1984  . EYE SURGERY Bilateral    cataract  . NM MYOVIEW LTD  01/2011   Dobutamine Myoview: Negative perfusion scan for ischemia / infarct (poor image capture); Pt developed SVT with Dobutamine that reproduced her CP as it resolved with restoration of NSR.  Marland Kitchen PUBOVAGINAL SLING    . TONSILLECTOMY AND ADENOIDECTOMY    . TRANSTHORACIC ECHOCARDIOGRAM  01/2011   Hyperdynamic LV with EF 65-70%, Gr 1 DD, Mild Ao Stenosis - mean gradient ~19 mmHg    Current Outpatient Prescriptions  Medication Sig Dispense Refill  . acetaminophen-codeine (TYLENOL #3) 300-30 MG per tablet Take 1 tablet by mouth every 8 (eight) hours as needed for moderate pain or severe pain. 90 tablet 0  . acetaZOLAMIDE (DIAMOX) 125 MG tablet Take 1 tablet 3 x/ day 270 tablet 1  . albuterol (PROVENTIL HFA;VENTOLIN HFA) 108 (90 Base) MCG/ACT inhaler Inhale 1-2 puffs into the lungs every 6 (six) hours as needed for wheezing or shortness of breath. [place]  g 2  . aspirin 81 MG tablet Take 81 mg by mouth daily.    . budesonide-formoterol (SYMBICORT) 160-4.5 MCG/ACT inhaler 2 puffs twice a day, wash mouth afterwards 1 Inhaler 0  . Cholecalciferol (VITAMIN D PO) Take 2,000 Units by mouth daily.     Marland Kitchen diltiazem (CARDIZEM CD) 300 MG 24 hr capsule Take 1 capsule (300 mg total) by mouth daily. 30 capsule 0  . DULoxetine (CYMBALTA) 60 MG capsule TAKE 1 CAPSULE BY MOUTH DAILY 90 capsule 1  . famotidine (PEPCID) 20 MG tablet TAKE 1 TABLET (20 MG TOTAL) BY MOUTH 2 (TWO) TIMES DAILY. FOR ACID REFLUX 180 tablet 1  . guaiFENesin (MUCINEX) 600 MG 12 hr tablet Take 600 mg by mouth daily.    Marland Kitchen levothyroxine (SYNTHROID, LEVOTHROID) 175 MCG tablet Take 1 tablet (175 mcg total)  by mouth daily before breakfast. 90 tablet 0  . loratadine-pseudoephedrine (CLARITIN-D 24-HOUR) 10-240 MG per 24 hr tablet Take 1 tablet by mouth as needed for allergies.     . Magnesium Chloride (MAGNESIUM DR PO) Take 1 capsule by mouth daily.     . metFORMIN (GLUCOPHAGE-XR) 500 MG 24 hr tablet TAKE 1 TABLET (500 MG TOTAL) BY MOUTH 4 (FOUR) TIMES DAILY - AFTER MEALS AND AT BEDTIME. 360 tablet 1  . metoprolol tartrate (LOPRESSOR) 25 MG tablet Take 1 tablet (25 mg total) by mouth 2 (two) times daily. 60 tablet 0  . OXYGEN-HELIUM IN Inhale 5 L into the lungs.     . pravastatin (PRAVACHOL) 40 MG tablet TAKE 1 TABLET (40 MG TOTAL) BY MOUTH DAILY. 90 tablet 1  . predniSONE (STERAPRED UNI-PAK 21 TAB) 10 MG (21) TBPK tablet Take 4 pills a day for 6 days Take 3 pills a day for 5 days  Take 2 pills a day for 4 days  Take 1 pill a day for 6 days 21 tablet 0  . warfarin (COUMADIN) 7.5 MG tablet Take 1 tablet (7.5 mg total) by mouth one time only at 6 PM. 20 tablet 0   No current facility-administered medications for this visit.     Allergies:   Inderal [propranolol] and Ace inhibitors    Social History:  The patient  reports that she has been smoking Cigarettes.  She has a 22.00 pack-year smoking history. She has never used smokeless tobacco. She reports that she does not drink alcohol or use drugs.   Family History:  The patient's family history includes Alcohol abuse in her father; Heart disease in her father.    ROS:  Please see the history of present illness. All other systems are reviewed and negative.    PHYSICAL EXAM: VS:  BP 112/76   Pulse 90   Ht 5' 6.5" (1.689 m)   Wt 261 lb (118.4 kg)   BMI 41.50 kg/m  , BMI Body mass index is 41.5 kg/m. GEN: Well nourished, well developed, female in no acute distress  HEENT: normal for age  Neck: no JVD seen but could not assess 2nd body habitus, no carotid bruit, no masses Cardiac: Irreg R&R; no murmur, no rubs, or gallops Respiratory:  decreased BS bases w/ scattered rales, no wheeze, normal work of breathing GI: soft, nontender, nondistended, + BS MS: no deformity or atrophy; L>R edema; varicose veins noted, distal pulses are 2+ in all 4 extremities   Skin: warm and dry, no rash Neuro:  Strength and sensation are intact Psych: euthymic mood, full affect   EKG:  EKG is not ordered today.  ECHO:  08/02/2016 - Left ventricle: The cavity size was moderately reduced. Wall   thickness was increased in a pattern of moderate LVH. The   estimated ejection fraction was in the range of 65% to 70%. Wall   motion was normal; there were no regional wall motion abnormalities. - Aortic valve: Severely calcified annulus. Moderately thickened,   moderately calcified leaflets. There was mild stenosis. Valve   area (VTI): 1.05 cm^2. Valve area (Vmax): 1.09 cm^2. Valve area   (Vmean): 1.05 cm^2. Mean gradient (S): 8 mm Hg. Peak gradient (S): 15 mm Hg. - Mitral valve: Moderately calcified annulus. - Left atrium: The atrium was severely dilated. - Right atrium: The atrium was mildly dilated. - Pulmonary arteries: Systolic pressure was severely increased. PA   peak pressure: 70 mm Hg (S).  Recent Labs: 06/06/2016: ALT 7 07/31/2016: B Natriuretic Peptide 448.9 08/01/2016: TSH 1.995 08/04/2016: Magnesium 2.1 08/13/2016: BUN 22; Creat 0.62; Hemoglobin 10.9; Platelets 278; Potassium 5.1; Sodium 140    Lipid Panel    Component Value Date/Time   CHOL 180 06/06/2016 1524   TRIG 187 (H) 06/06/2016 1524   HDL 75 06/06/2016 1524   CHOLHDL 2.4 06/06/2016 1524   VLDL 37 (H) 06/06/2016 1524   LDLCALC 68 06/06/2016 1524     Wt Readings from Last 3 Encounters:  08/28/16 261 lb (118.4 kg)  08/23/16 250 lb 1.6 oz (113.4 kg)  08/13/16 236 lb (107 kg)     Other studies Reviewed: Additional studies/ records that were reviewed today include: Hospital records, office notes and testing.  ASSESSMENT AND PLAN:  1.  Acute On chronic diastolic  CHF: Her weight is significantly higher. Her respiratory status is worse. She has been on the Diamox daily. She has not taken the Lasix and long time. Her systolic blood pressure is 112, so do not want to drop it too much.   We will start with Lasix 20 mg daily and see if she improves. If Lasix 20 mg daily does not diurese her along with the Diamox, we can decrease her Cardizem to allow her blood pressure to run a little higher.  She is encouraged to stick to a 2000 mg sodium diet. She is encouraged to get daily weights. She is to get labs at her primary care physician's office. We will decide on potassium supplementation at that time.  2. Atrial flutter: She is still in it by exam. She does not have palpitations and has no awareness of rapid or irregular heart rate. She is on a low dose of metoprolol and diltiazem. It may be that we titrate down the Cardizem and increase the metoprolol to maintain heart rate control. Her left atrium was severely dilated on recent echo, she is unlikely to maintain sinus rhythm. Continue rate control.  3. Chronic anticoagulation. Her Coumadin is followed by Dr. Melford Aase.   Current medicines are reviewed at length with the patient today.  The patient does not have concerns regarding medicines.  The following changes have been made:  Add daily Lasix  Labs/ tests ordered today include: BMET and CBC with Dr. Melford Aase  No orders of the defined types were placed in this encounter.    Disposition:   FU with Dr. Gwenlyn Found  Signed, Lenoard Aden  08/28/2016 2:33 PM    [facility] Phone: [phone]; Fax: [phone]  This note was written with the assistance of speech recognition software. Please excuse any transcriptional errors.

## 2016-08-29 ENCOUNTER — Ambulatory Visit (HOSPITAL_BASED_OUTPATIENT_CLINIC_OR_DEPARTMENT_OTHER): Payer: PPO

## 2016-08-30 ENCOUNTER — Other Ambulatory Visit: Payer: Self-pay | Admitting: Internal Medicine

## 2016-08-30 ENCOUNTER — Other Ambulatory Visit (INDEPENDENT_AMBULATORY_CARE_PROVIDER_SITE_OTHER): Payer: PPO | Admitting: *Deleted

## 2016-08-30 DIAGNOSIS — Z7901 Long term (current) use of anticoagulants: Secondary | ICD-10-CM

## 2016-08-30 DIAGNOSIS — T45515A Adverse effect of anticoagulants, initial encounter: Secondary | ICD-10-CM

## 2016-08-30 DIAGNOSIS — N179 Acute kidney failure, unspecified: Secondary | ICD-10-CM

## 2016-08-30 DIAGNOSIS — D6832 Hemorrhagic disorder due to extrinsic circulating anticoagulants: Secondary | ICD-10-CM | POA: Insufficient documentation

## 2016-08-30 DIAGNOSIS — Z79899 Other long term (current) drug therapy: Secondary | ICD-10-CM

## 2016-08-30 DIAGNOSIS — I4891 Unspecified atrial fibrillation: Secondary | ICD-10-CM

## 2016-08-30 LAB — BASIC METABOLIC PANEL WITH GFR
BUN: 26 mg/dL — ABNORMAL HIGH (ref 7–25)
CHLORIDE: 101 mmol/L (ref 98–110)
CO2: 30 mmol/L (ref 20–31)
Calcium: 8.8 mg/dL (ref 8.6–10.4)
Creat: 1.18 mg/dL — ABNORMAL HIGH (ref 0.60–0.93)
GFR, EST NON AFRICAN AMERICAN: 47 mL/min — AB (ref >=60)
GFR, Est African American: 54 mL/min — ABNORMAL LOW (ref >=60)
GLUCOSE: 158 mg/dL — AB (ref 65–99)
POTASSIUM: 4.4 mmol/L (ref 3.5–5.3)
Sodium: 141 mmol/L (ref 135–146)

## 2016-08-30 LAB — PROTIME-INR
INR: 2.9 — ABNORMAL HIGH
Prothrombin Time: 29.1 s — ABNORMAL HIGH (ref 9.0–11.5)

## 2016-08-30 NOTE — Progress Notes (Signed)
Patient currently taking Coumadin  1 pill (7.5 mg) even days and 1/2 (3.75 mg)  pill odd days. Also cardiologists requesting BMP to check potassium level.

## 2016-09-10 DIAGNOSIS — J449 Chronic obstructive pulmonary disease, unspecified: Secondary | ICD-10-CM | POA: Diagnosis not present

## 2016-09-10 DIAGNOSIS — J9610 Chronic respiratory failure, unspecified whether with hypoxia or hypercapnia: Secondary | ICD-10-CM | POA: Diagnosis not present

## 2016-09-11 ENCOUNTER — Other Ambulatory Visit: Payer: Self-pay | Admitting: Internal Medicine

## 2016-09-11 NOTE — Progress Notes (Signed)
Patient called relating weight is up 7# more to 267# from her cardiology appt on 12/13 (weight 261#) when she had added on lasix 20 mg daily with her Diamox 250 mg bid. Weight is up 17# since her 12/8  visit (250#) in this office. Son York Cerise note the lasix 20 does not seem to be having an effect and he was explained that sometimes the dosing threshold must be reached to achieve a diuresis, so he was advised to increase Lasix to 40 mg and continue daily weights and report back if no response.

## 2016-09-13 ENCOUNTER — Other Ambulatory Visit: Payer: Self-pay

## 2016-09-13 ENCOUNTER — Other Ambulatory Visit: Payer: Self-pay | Admitting: *Deleted

## 2016-09-13 DIAGNOSIS — J449 Chronic obstructive pulmonary disease, unspecified: Secondary | ICD-10-CM | POA: Diagnosis not present

## 2016-09-13 MED ORDER — DILTIAZEM HCL ER COATED BEADS 300 MG PO CP24: 300.0000 mg | ORAL_CAPSULE | Freq: Every day | ORAL | 2 refills | Status: DC

## 2016-09-13 MED ORDER — WARFARIN SODIUM 7.5 MG PO TABS: 7.5000 mg | ORAL_TABLET | Freq: Once | ORAL | 0 refills | Status: DC

## 2016-09-13 MED ORDER — METOPROLOL TARTRATE 25 MG PO TABS: 25.0000 mg | ORAL_TABLET | Freq: Two times a day (BID) | ORAL | 2 refills | Status: DC

## 2016-09-13 MED ORDER — FUROSEMIDE 20 MG PO TABS: 20.0000 mg | ORAL_TABLET | Freq: Every day | ORAL | 3 refills | Status: DC

## 2016-09-13 MED ORDER — DILTIAZEM HCL ER COATED BEADS 300 MG PO CP24: 300.0000 mg | ORAL_CAPSULE | Freq: Every day | ORAL | 0 refills | Status: DC

## 2016-09-17 ENCOUNTER — Other Ambulatory Visit: Payer: Self-pay | Admitting: *Deleted

## 2016-09-17 MED ORDER — METOLAZONE 5 MG PO TABS: 5.0000 mg | ORAL_TABLET | Freq: Every day | ORAL | 0 refills | Status: DC

## 2016-09-17 MED ORDER — FUROSEMIDE 80 MG PO TABS: 80.0000 mg | ORAL_TABLET | Freq: Every day | ORAL | 0 refills | Status: DC

## 2016-09-17 NOTE — Telephone Encounter (Signed)
Patient's son called and states the patient is still retaining fluid and her weight is still up.  She is taking Diamox and Furosemide 80 mg daily.  She is short of breath on exertion.  Per Dr Melford Aase, add Rx of Zaroxolyn 5 mg daily, weigh patient daily and if she loses more than 2 pounds from 1 day to the next, hold the Zaroxolyn that day.  The son is aware and the patient has an OV to evaluate on 09/23/2016.  RX for [employer] sent in.

## 2016-09-18 ENCOUNTER — Other Ambulatory Visit: Payer: Self-pay | Admitting: Internal Medicine

## 2016-09-18 DIAGNOSIS — J449 Chronic obstructive pulmonary disease, unspecified: Secondary | ICD-10-CM | POA: Diagnosis not present

## 2016-09-19 ENCOUNTER — Other Ambulatory Visit: Payer: Self-pay | Admitting: Internal Medicine

## 2016-09-23 ENCOUNTER — Ambulatory Visit (INDEPENDENT_AMBULATORY_CARE_PROVIDER_SITE_OTHER): Payer: PPO | Admitting: Internal Medicine

## 2016-09-23 ENCOUNTER — Encounter: Payer: Self-pay | Admitting: Internal Medicine

## 2016-09-23 ENCOUNTER — Ambulatory Visit: Payer: Self-pay

## 2016-09-23 VITALS — BP 108/74 | HR 80 | Temp 98.2°F | Resp 18 | Ht 66.5 in | Wt 250.0 lb

## 2016-09-23 DIAGNOSIS — Z7901 Long term (current) use of anticoagulants: Secondary | ICD-10-CM | POA: Diagnosis not present

## 2016-09-23 DIAGNOSIS — I5023 Acute on chronic systolic (congestive) heart failure: Secondary | ICD-10-CM | POA: Diagnosis not present

## 2016-09-23 DIAGNOSIS — J449 Chronic obstructive pulmonary disease, unspecified: Secondary | ICD-10-CM | POA: Diagnosis not present

## 2016-09-23 DIAGNOSIS — Z79899 Other long term (current) drug therapy: Secondary | ICD-10-CM | POA: Diagnosis not present

## 2016-09-23 DIAGNOSIS — E039 Hypothyroidism, unspecified: Secondary | ICD-10-CM | POA: Diagnosis not present

## 2016-09-23 NOTE — Progress Notes (Signed)
Patient ID: Jillian Wood, female   DOB: 09/16/1946, 71 y.o.   MRN: [id number]  Assessment and Plan:   1. Acute on chronic systolic CHF (congestive heart failure) ([place]) -likely a component of cor pulmonale involved in this heart failure -has clinically improved signficantly, is now basically at dry weight and will stop zaroxolyn and cut lasix back to 40 mg.  Patient to weight daily and if she gains more than 2 lbs per day.  She is aware.   -cont diamox -cont lasix 40 mg -cont potassium  2. Hypothyroidism, unspecified type -cont levothyroxine - TSH  3. Current use of long term anticoagulation -cont coumadin - Protime-INR  4. Medication management -rehceck CBC with diuresis and check potassium.   - CBC with Differential/Platelet - BASIC METABOLIC PANEL WITH GFR - Hepatic function panel       HPI 71 y.o.female presents for 1 month follow up of acute on chronic exacerbation of heart failure and also chronic oxygen dependent COPD. She notes that she has been feeling better.  She is down 11 lbs with her fluid pills. She notes that she has not been able to be very active.  Any time that she has to do any exertional activity she is generally a 2-3/10.  She feels like she is worse than she was 2 months prior to when she went in the hospital.  She notes that she just lacks stamina in general.  She reports that she struggles to get around in her house.  She can walk 10-20 feet and then she gets short of breath and wants to go ahead and use her rescue inhaler.  She reports that this makes her feel like she needs to panic.  She is currently on 5L per minute.  She reports that she has been taking 80 mg of lasix and taking 5 mg of zaroxolyn.  She reports that she had a big drop in blood pressure yesterday.  She notes that today she has not taken anything.    Past Medical History:  Diagnosis Date  . Arthritis   . COPD (chronic obstructive pulmonary disease) ([place])   . Depression   . Diabetes  mellitus type 2 in obese ([place])   . GERD (gastroesophageal reflux disease)   . Hyperlipidemia   . Hypertension   . Morbid obesity ([place])   . OAB (overactive bladder)   . PSVT (paroxysmal supraventricular tachycardia) ([place])   . Thyroid disease   . Vitamin D deficiency      Allergies  Allergen Reactions  . Inderal [Propranolol] Other (See Comments)    Hair loss  . Ace Inhibitors Cough      Current Outpatient Prescriptions on File Prior to Visit  Medication Sig Dispense Refill  . acetaminophen-codeine (TYLENOL #3) 300-30 MG per tablet Take 1 tablet by mouth every 8 (eight) hours as needed for moderate pain or severe pain. 90 tablet 0  . acetaZOLAMIDE (DIAMOX) 125 MG tablet Take 1 tablet 3 x/ day 270 tablet 1  . albuterol (PROVENTIL HFA;VENTOLIN HFA) 108 (90 Base) MCG/ACT inhaler Inhale 1-2 puffs into the lungs every 6 (six) hours as needed for wheezing or shortness of breath. 18 g 2  . aspirin 81 MG tablet Take 81 mg by mouth daily.    . budesonide-formoterol (SYMBICORT) 160-4.5 MCG/ACT inhaler 2 puffs twice a day, wash mouth afterwards 1 Inhaler 0  . Cholecalciferol (VITAMIN D PO) Take 2,000 Units by mouth daily.     Marland Kitchen diltiazem (CARDIZEM CD) 300 MG  24 hr capsule Take 1 capsule (300 mg total) by mouth daily. 30 capsule 2  . DULoxetine (CYMBALTA) 60 MG capsule TAKE 1 CAPSULE BY MOUTH DAILY 90 capsule 1  . famotidine (PEPCID) 20 MG tablet TAKE 1 TABLET (20 MG TOTAL) BY MOUTH 2 (TWO) TIMES DAILY. FOR ACID REFLUX 180 tablet 0  . furosemide (LASIX) 80 MG tablet Take 1 tablet (80 mg total) by mouth daily. 90 tablet 0  . guaiFENesin (MUCINEX) 600 MG 12 hr tablet Take 600 mg by mouth daily.    Marland Kitchen levothyroxine (SYNTHROID, LEVOTHROID) 175 MCG tablet TAKE 1 TABLET (175 MCG TOTAL) BY MOUTH DAILY BEFORE BREAKFAST. 90 tablet 0  . loratadine-pseudoephedrine (CLARITIN-D 24-HOUR) 10-240 MG per 24 hr tablet Take 1 tablet by mouth as needed for allergies.     . Magnesium Chloride (MAGNESIUM DR PO) Take 1  capsule by mouth daily.     . metFORMIN (GLUCOPHAGE-XR) 500 MG 24 hr tablet TAKE 1 TABLET (500 MG TOTAL) BY MOUTH 4 (FOUR) TIMES DAILY - AFTER MEALS AND AT BEDTIME. 360 tablet 1  . metolazone (ZAROXOLYN) 5 MG tablet Take 1 tablet (5 mg total) by mouth daily. 30 tablet 0  . metoprolol tartrate (LOPRESSOR) 25 MG tablet Take 1 tablet (25 mg total) by mouth 2 (two) times daily. 60 tablet 2  . OXYGEN-HELIUM IN Inhale 5 L into the lungs.     . pravastatin (PRAVACHOL) 40 MG tablet TAKE 1 TABLET (40 MG TOTAL) BY MOUTH DAILY. 90 tablet 1  . warfarin (COUMADIN) 7.5 MG tablet Take 1 tablet (7.5 mg total) by mouth one time only at 6 PM. 15 tablet 0   No current facility-administered medications on file prior to visit.     ROS: all negative except above.   Physical Exam: Filed Weights   09/23/16 1537  Weight: 250 lb (113.4 kg)   BP 108/74   Pulse 80   Temp 98.2 F (36.8 C) (Temporal)   Resp 18   Ht 5' 6.5" (1.689 m)   Wt 250 lb (113.4 kg)   BMI 39.75 kg/m    Wt Readings from Last 3 Encounters:  09/23/16 250 lb (113.4 kg)  08/28/16 261 lb (118.4 kg)  08/23/16 250 lb 1.6 oz (113.4 kg)    General Appearance: Well developed well nourished, non-toxic appearing in no apparent distress. Eyes: PERRLA, EOMs, conjunctiva w/ no swelling or erythema or discharge Sinuses: No Frontal/maxillary tenderness ENT/Mouth: Ear canals clear without swelling or erythema.  TM's normal bilaterally with no retractions, bulging, or loss of landmarks.   Neck: Supple, thyroid normal, no notable JVD  Respiratory: Respiratory effort normal, Clear breath sounds anteriorly and posteriorly bilaterally without rales, rhonchi, wheezing or stridor. No retractions or accessory muscle usage. Cardio: RRR with no MRGs.   Abdomen: Soft, + BS.  Non tender, no guarding, rebound, hernias, masses.  Musculoskeletal: Full ROM, 5/5 strength, normal gait.  Skin: Warm, dry without rashes  Neuro: Awake and oriented X 3, Cranial nerves  intact. Normal muscle tone, no cerebellar symptoms. Sensation intact.  Psych: normal affect, Insight and Judgment appropriate.     Starlyn Skeans, PA-C 4:16 PM [facility]

## 2016-09-24 ENCOUNTER — Other Ambulatory Visit: Payer: Self-pay | Admitting: Internal Medicine

## 2016-09-24 ENCOUNTER — Ambulatory Visit: Payer: PPO | Admitting: Cardiovascular Disease

## 2016-09-24 ENCOUNTER — Ambulatory Visit: Payer: PPO | Admitting: Student

## 2016-09-24 LAB — CBC WITH DIFFERENTIAL/PLATELET
BASOS ABS: 0 {cells}/uL (ref 0–200)
BASOS PCT: 0 %
EOS ABS: 78 {cells}/uL (ref 15–500)
Eosinophils Relative: 1 %
HEMATOCRIT: 32.7 % — AB (ref 35.0–45.0)
HEMOGLOBIN: 9.3 g/dL — AB (ref 11.7–15.5)
LYMPHS ABS: 1170 {cells}/uL (ref 850–3900)
Lymphocytes Relative: 15 %
MCH: 22.4 pg — AB (ref 27.0–33.0)
MCHC: 28.4 g/dL — ABNORMAL LOW (ref 32.0–36.0)
MCV: 78.8 fL — AB (ref 80.0–100.0)
MONO ABS: 702 {cells}/uL (ref 200–950)
MONOS PCT: 9 %
MPV: 9.5 fL (ref 7.5–12.5)
NEUTROS ABS: 5850 {cells}/uL (ref 1500–7800)
Neutrophils Relative %: 75 %
PLATELETS: 400 10*3/uL (ref 140–400)
RBC: 4.15 MIL/uL (ref 3.80–5.10)
RDW: 16.9 % — ABNORMAL HIGH (ref 11.0–15.0)
WBC: 7.8 10*3/uL (ref 3.8–10.8)

## 2016-09-24 LAB — BASIC METABOLIC PANEL WITH GFR
BUN: 19 mg/dL (ref 7–25)
CHLORIDE: 93 mmol/L — AB (ref 98–110)
CO2: 40 mmol/L — AB (ref 20–31)
CREATININE: 0.81 mg/dL (ref 0.60–0.93)
Calcium: 9.1 mg/dL (ref 8.6–10.4)
GFR, Est African American: 85 mL/min (ref >=60)
GFR, Est Non African American: 74 mL/min (ref >=60)
Glucose, Bld: 115 mg/dL — ABNORMAL HIGH (ref 65–99)
POTASSIUM: 3.6 mmol/L (ref 3.5–5.3)
Sodium: 143 mmol/L (ref 135–146)

## 2016-09-24 LAB — HEPATIC FUNCTION PANEL
ALBUMIN: 3.5 g/dL — AB (ref 3.6–5.1)
ALT: 7 U/L (ref 6–29)
AST: 12 U/L (ref 10–35)
Alkaline Phosphatase: 80 U/L (ref 33–130)
BILIRUBIN TOTAL: 0.4 mg/dL (ref 0.2–1.2)
Bilirubin, Direct: 0.1 mg/dL (ref <=0.2)
Indirect Bilirubin: 0.3 mg/dL (ref 0.2–1.2)
Total Protein: 6.5 g/dL (ref 6.1–8.1)

## 2016-09-24 LAB — TSH: TSH: 1.88 mIU/L

## 2016-09-24 LAB — PROTIME-INR
INR: 1.1
Prothrombin Time: 11.6 s — ABNORMAL HIGH (ref 9.0–11.5)

## 2016-09-24 MED ORDER — WARFARIN SODIUM 5 MG PO TABS: 5.0000 mg | ORAL_TABLET | Freq: Once | ORAL | 0 refills | Status: DC

## 2016-09-26 ENCOUNTER — Ambulatory Visit: Payer: PPO | Admitting: Student

## 2016-09-26 NOTE — Progress Notes (Deleted)
Cardiology Office Note    Date:  09/26/2016   ID:  Jillian Wood, DOB April 13, 1946, MRN ID:[id number]  PCP:  Alesia Richards, MD  Cardiologist: Dr. Gwenlyn Found   No chief complaint on file.   History of Present Illness:    Jillian Wood is a 71 y.o. female with past medical history of chronic diastolic CHF (EF [id number] by echo in 07/2016), PAF (on Coumadin), HTN, HLD, GERD, COPD, and mild AS who presents to the office today for follow-up.   She was last seen by Rosaria Ferries, PA-C on 08/28/2016 following a recent hospitalization for acute respiratory failure in the setting of a COPD exacerbation and acute CHF. She was noted to be volume overloaded at the time of her appointment (weight at 261 lbs) and started on Lasix 20mg  daily in addition to her previously prescribed Diamox.      Past Medical History:  Diagnosis Date  . Arthritis   . COPD (chronic obstructive pulmonary disease) ([place])   . Depression   . Diabetes mellitus type 2 in obese ([place])   . GERD (gastroesophageal reflux disease)   . Hyperlipidemia   . Hypertension   . Morbid obesity ([place])   . OAB (overactive bladder)   . PSVT (paroxysmal supraventricular tachycardia) ([place])   . Thyroid disease   . Vitamin D deficiency     Past Surgical History:  Procedure Laterality Date  . CARPAL TUNNEL RELEASE     L 1992 R 1984  . EYE SURGERY Bilateral    cataract  . NM MYOVIEW LTD  01/2011   Dobutamine Myoview: Negative perfusion scan for ischemia / infarct (poor image capture); Pt developed SVT with Dobutamine that reproduced her CP as it resolved with restoration of NSR.  Marland Kitchen PUBOVAGINAL SLING    . TONSILLECTOMY AND ADENOIDECTOMY    . TRANSTHORACIC ECHOCARDIOGRAM  01/2011   Hyperdynamic LV with EF 65-70%, Gr 1 DD, Mild Ao Stenosis - mean gradient ~19 mmHg    Current Medications: Outpatient Medications Prior to Visit  Medication Sig Dispense Refill  . acetaminophen-codeine (TYLENOL #3) 300-30 MG per tablet Take 1 tablet by  mouth every 8 (eight) hours as needed for moderate pain or severe pain. 90 tablet 0  . acetaZOLAMIDE (DIAMOX) 125 MG tablet Take 1 tablet 3 x/ day 270 tablet 1  . albuterol (PROVENTIL HFA;VENTOLIN HFA) 108 (90 Base) MCG/ACT inhaler Inhale 1-2 puffs into the lungs every 6 (six) hours as needed for wheezing or shortness of breath. 18 g 2  . aspirin 81 MG tablet Take 81 mg by mouth daily.    . budesonide-formoterol (SYMBICORT) 160-4.5 MCG/ACT inhaler 2 puffs twice a day, wash mouth afterwards 1 Inhaler 0  . Cholecalciferol (VITAMIN D PO) Take 2,000 Units by mouth daily.     Marland Kitchen diltiazem (CARDIZEM CD) 300 MG 24 hr capsule Take 1 capsule (300 mg total) by mouth daily. 30 capsule 2  . DULoxetine (CYMBALTA) 60 MG capsule TAKE 1 CAPSULE BY MOUTH DAILY 90 capsule 1  . famotidine (PEPCID) 20 MG tablet TAKE 1 TABLET (20 MG TOTAL) BY MOUTH 2 (TWO) TIMES DAILY. FOR ACID REFLUX 180 tablet 0  . furosemide (LASIX) 80 MG tablet Take 1 tablet (80 mg total) by mouth daily. 90 tablet 0  . guaiFENesin (MUCINEX) 600 MG 12 hr tablet Take 600 mg by mouth daily.    Marland Kitchen levothyroxine (SYNTHROID, LEVOTHROID) 175 MCG tablet TAKE 1 TABLET (175 MCG TOTAL) BY MOUTH DAILY BEFORE BREAKFAST. 90 tablet 0  .  loratadine-pseudoephedrine (CLARITIN-D 24-HOUR) 10-240 MG per 24 hr tablet Take 1 tablet by mouth as needed for allergies.     . Magnesium Chloride (MAGNESIUM DR PO) Take 1 capsule by mouth daily.     . metFORMIN (GLUCOPHAGE-XR) 500 MG 24 hr tablet TAKE 1 TABLET (500 MG TOTAL) BY MOUTH 4 (FOUR) TIMES DAILY - AFTER MEALS AND AT BEDTIME. 360 tablet 1  . metolazone (ZAROXOLYN) 5 MG tablet Take 1 tablet (5 mg total) by mouth daily. 30 tablet 0  . metoprolol tartrate (LOPRESSOR) 25 MG tablet Take 1 tablet (25 mg total) by mouth 2 (two) times daily. 60 tablet 2  . OXYGEN-HELIUM IN Inhale 5 L into the lungs.     . pravastatin (PRAVACHOL) 40 MG tablet TAKE 1 TABLET (40 MG TOTAL) BY MOUTH DAILY. 90 tablet 1  . warfarin (COUMADIN) 5 MG  tablet Take 1 tablet (5 mg total) by mouth one time only at 6 PM. 30 tablet 0   No facility-administered medications prior to visit.      Allergies:   Inderal [propranolol] and Ace inhibitors   Social History   Social History  . Marital status: Married    Spouse name: N/A  . Number of children: N/A  . Years of education: N/A   Social History Main Topics  . Smoking status: Current Every Day Smoker    Packs/day: 0.50    Years: 44.00    Types: Cigarettes  . Smokeless tobacco: Never Used     Comment: smoke about 5-6 cigarette daily  . Alcohol use No  . Drug use: No  . Sexual activity: Not on file   Other Topics Concern  . Not on file   Social History Narrative  . No narrative on file     Family History:  The patient's ***family history includes Alcohol abuse in her father; Heart disease in her father.   Review of Systems:   Please see the history of present illness.     General:  No chills, fever, night sweats or weight changes.  Cardiovascular:  No chest pain, dyspnea on exertion, edema, orthopnea, palpitations, paroxysmal nocturnal dyspnea. Dermatological: No rash, lesions/masses Respiratory: No cough, dyspnea Urologic: No hematuria, dysuria Abdominal:   No nausea, vomiting, diarrhea, bright red blood per rectum, melena, or hematemesis Neurologic:  No visual changes, wkns, changes in mental status. All other systems reviewed and are otherwise negative except as noted above.   Physical Exam:    VS:  There were no vitals taken for this visit.   General: Well developed, well nourished,female appearing in no acute distress. Head: Normocephalic, atraumatic, sclera non-icteric, no xanthomas, nares are without discharge.  Neck: No carotid bruits. JVD not elevated.  Lungs: Respirations regular and unlabored, without wheezes or rales.  Heart: ***Regular rate and rhythm. No S3 or S4.  No murmur, no rubs, or gallops appreciated. Abdomen: Soft, non-tender, non-distended with  normoactive bowel sounds. No hepatomegaly. No rebound/guarding. No obvious abdominal masses. Msk:  Strength and tone appear normal for age. No joint deformities or effusions. Extremities: No clubbing or cyanosis. No edema.  Distal pedal pulses are 2+ bilaterally. Neuro: Alert and oriented X 3. Moves all extremities spontaneously. No focal deficits noted. Psych:  Responds to questions appropriately with a normal affect. Skin: No rashes or lesions noted  Wt Readings from Last 3 Encounters:  09/23/16 250 lb (113.4 kg)  08/28/16 261 lb (118.4 kg)  08/23/16 250 lb 1.6 oz (113.4 kg)  Studies/Labs Reviewed:   EKG:  EKG is*** ordered today.  The ekg ordered today demonstrates ***  Recent Labs: 07/31/2016: B Natriuretic Peptide 448.9 08/04/2016: Magnesium 2.1 09/23/2016: ALT 7; BUN 19; Creat 0.81; Hemoglobin 9.3; Platelets 400; Potassium 3.6; Sodium 143; TSH 1.88   Lipid Panel    Component Value Date/Time   CHOL 180 06/06/2016 1524   TRIG 187 (H) 06/06/2016 1524   HDL 75 06/06/2016 1524   CHOLHDL 2.4 06/06/2016 1524   VLDL 37 (H) 06/06/2016 1524   LDLCALC 68 06/06/2016 1524    Additional studies/ records that were reviewed today include:   Echocardiogram: 07/2016 Study Conclusions  - Left ventricle: The cavity size was moderately reduced. Wall   thickness was increased in a pattern of moderate LVH. The   estimated ejection fraction was in the range of 65% to 70%. Wall   motion was normal; there were no regional wall motion   abnormalities. - Aortic valve: Severely calcified annulus. Moderately thickened,   moderately calcified leaflets. There was mild stenosis. Valve   area (VTI): 1.05 cm^2. Valve area (Vmax): 1.09 cm^2. Valve area   (Vmean): 1.05 cm^2. - Mitral valve: Moderately calcified annulus. - Left atrium: The atrium was severely dilated. - Right atrium: The atrium was mildly dilated. - Pulmonary arteries: Systolic pressure was severely increased. PA   peak  pressure: 70 mm Hg (S).  Assessment:    No diagnosis found.   Plan:   In order of problems listed above:  1. ***    Medication Adjustments/Labs and Tests Ordered: Current medicines are reviewed at length with the patient today.  Concerns regarding medicines are outlined above.  Medication changes, Labs and Tests ordered today are listed in the Patient Instructions below. There are no Patient Instructions on file for this visit.   Signed, Erma Heritage, PA  09/26/2016 6:42 AM    [address], [address], [place]  [address] Phone: [phone]; Fax: [phone]  [address], [place], [place] [address] Phone: [phone]

## 2016-09-29 ENCOUNTER — Other Ambulatory Visit: Payer: Self-pay | Admitting: Physician Assistant

## 2016-10-01 ENCOUNTER — Ambulatory Visit: Payer: PPO | Admitting: Physician Assistant

## 2016-10-01 NOTE — Progress Notes (Deleted)
Cardiology Office Note   Date:  10/01/2016   ID:  THYRA ARMOUR, DOB May 13, 1946, MRN [id number]  PCP:  Alesia Richards, MD  Cardiologist:  Dr. Gwenlyn Found  EP: Dr. Marye Round, Suanne Marker, PA-C 08/28/2016  No chief complaint on file.   History of Present Illness: Jillian Wood is a 71 y.o. female with a history of HTN, PSVT, chest pain, COPD on O2, tob use, DM, HLD, GERD, chronic D-CHF, AS, atrial flutter dx 07/2016, on Coumadin CHA2DS2VASc=5 (age x 1, female, DM, HTN, CHF)  Office visit 08/28/2016, her weight was up 11 pounds and her breathing was worse, Lasix 20 mg daily added to Diamox BMET by PCP 09/23/2016 with K+ 3.6, renal function okay  Jillian Wood presents for ***   Past Medical History:  Diagnosis Date  . Arthritis   . COPD (chronic obstructive pulmonary disease) ([place])   . Depression   . Diabetes mellitus type 2 in obese ([place])   . GERD (gastroesophageal reflux disease)   . Hyperlipidemia   . Hypertension   . Morbid obesity ([place])   . OAB (overactive bladder)   . PSVT (paroxysmal supraventricular tachycardia) ([place])   . Thyroid disease   . Vitamin D deficiency     Past Surgical History:  Procedure Laterality Date  . CARPAL TUNNEL RELEASE     L 1992 R 1984  . EYE SURGERY Bilateral    cataract  . NM MYOVIEW LTD  01/2011   Dobutamine Myoview: Negative perfusion scan for ischemia / infarct (poor image capture); Pt developed SVT with Dobutamine that reproduced her CP as it resolved with restoration of NSR.  Marland Kitchen PUBOVAGINAL SLING    . TONSILLECTOMY AND ADENOIDECTOMY    . TRANSTHORACIC ECHOCARDIOGRAM  01/2011   Hyperdynamic LV with EF 65-70%, Gr 1 DD, Mild Ao Stenosis - mean gradient ~19 mmHg    Current Outpatient Prescriptions  Medication Sig Dispense Refill  . acetaminophen-codeine (TYLENOL #3) 300-30 MG per tablet Take 1 tablet by mouth every 8 (eight) hours as needed for moderate pain or severe pain. 90 tablet 0  . acetaZOLAMIDE (DIAMOX) 125 MG  tablet Take 1 tablet 3 x/ day 270 tablet 1  . albuterol (PROVENTIL HFA;VENTOLIN HFA) 108 (90 Base) MCG/ACT inhaler Inhale 1-2 puffs into the lungs every 6 (six) hours as needed for wheezing or shortness of breath. 18 g 2  . aspirin 81 MG tablet Take 81 mg by mouth daily.    . budesonide-formoterol (SYMBICORT) 160-4.5 MCG/ACT inhaler 2 puffs twice a day, wash mouth afterwards 1 Inhaler 0  . Cholecalciferol (VITAMIN D PO) Take 2,000 Units by mouth daily.     Marland Kitchen diltiazem (CARDIZEM CD) 300 MG 24 hr capsule Take 1 capsule (300 mg total) by mouth daily. 30 capsule 2  . DULoxetine (CYMBALTA) 60 MG capsule TAKE 1 CAPSULE BY MOUTH DAILY 90 capsule 1  . famotidine (PEPCID) 20 MG tablet TAKE 1 TABLET (20 MG TOTAL) BY MOUTH 2 (TWO) TIMES DAILY. FOR ACID REFLUX 180 tablet 0  . furosemide (LASIX) 80 MG tablet Take 1 tablet (80 mg total) by mouth daily. 90 tablet 0  . guaiFENesin (MUCINEX) 600 MG 12 hr tablet Take 600 mg by mouth daily.    Marland Kitchen levothyroxine (SYNTHROID, LEVOTHROID) 175 MCG tablet TAKE 1 TABLET (175 MCG TOTAL) BY MOUTH DAILY BEFORE BREAKFAST. 90 tablet 0  . loratadine-pseudoephedrine (CLARITIN-D 24-HOUR) 10-240 MG per 24 hr tablet Take 1 tablet by mouth as needed for allergies.     Marland Kitchen  losartan (COZAAR) 50 MG tablet TAKE 1 TABLET BY MOUTH DAILY 90 tablet 2  . Magnesium Chloride (MAGNESIUM DR PO) Take 1 capsule by mouth daily.     . metFORMIN (GLUCOPHAGE-XR) 500 MG 24 hr tablet TAKE 1 TABLET (500 MG TOTAL) BY MOUTH 4 (FOUR) TIMES DAILY - AFTER MEALS AND AT BEDTIME. 360 tablet 1  . metolazone (ZAROXOLYN) 5 MG tablet Take 1 tablet (5 mg total) by mouth daily. 30 tablet 0  . metoprolol tartrate (LOPRESSOR) 25 MG tablet Take 1 tablet (25 mg total) by mouth 2 (two) times daily. 60 tablet 2  . OXYGEN-HELIUM IN Inhale 5 L into the lungs.     . pravastatin (PRAVACHOL) 40 MG tablet TAKE 1 TABLET (40 MG TOTAL) BY MOUTH DAILY. 90 tablet 1  . warfarin (COUMADIN) 5 MG tablet Take 1 tablet (5 mg total) by mouth one  time only at 6 PM. 30 tablet 0   No current facility-administered medications for this visit.     Allergies:   Inderal [propranolol] and Ace inhibitors    Social History:  The patient  reports that she has been smoking Cigarettes.  She has a 22.00 pack-year smoking history. She has never used smokeless tobacco. She reports that she does not drink alcohol or use drugs.   Family History:  The patient's family history includes Alcohol abuse in her father; Heart disease in her father.    ROS:  Please see the history of present illness. All other systems are reviewed and negative.    PHYSICAL EXAM: VS:  There were no vitals taken for this visit. , BMI There is no height or weight on file to calculate BMI. GEN: Well nourished, well developed, female in no acute distress  HEENT: normal for age  Neck: no JVD, no carotid bruit, no masses Cardiac: RRR; no murmur, no rubs, or gallops Respiratory:  clear to auscultation bilaterally, normal work of breathing GI: soft, nontender, nondistended, + BS MS: no deformity or atrophy; no edema; distal pulses are 2+ in all 4 extremities   Skin: warm and dry, no rash Neuro:  Strength and sensation are intact Psych: euthymic mood, full affect   EKG:  EKG {ACTION; IS/IS [id number] ordered today. The ekg ordered today demonstrates ***   Recent Labs: 07/31/2016: B Natriuretic Peptide 448.9 08/04/2016: Magnesium 2.1 09/23/2016: ALT 7; BUN 19; Creat 0.81; Hemoglobin 9.3; Platelets 400; Potassium 3.6; Sodium 143; TSH 1.88    Lipid Panel    Component Value Date/Time   CHOL 180 06/06/2016 1524   TRIG 187 (H) 06/06/2016 1524   HDL 75 06/06/2016 1524   CHOLHDL 2.4 06/06/2016 1524   VLDL 37 (H) 06/06/2016 1524   LDLCALC 68 06/06/2016 1524     Wt Readings from Last 3 Encounters:  09/23/16 250 lb (113.4 kg)  08/28/16 261 lb (118.4 kg)  08/23/16 250 lb 1.6 oz (113.4 kg)     Other studies Reviewed: Additional studies/ records that were reviewed  today include: ***.  ASSESSMENT AND PLAN:  1.  ***   Current medicines are reviewed at length with the patient today.  The patient {ACTIONS; HAS/DOES NOT HAVE:19233} concerns regarding medicines.  The following changes have been made:  {PLAN; NO CHANGE:13088:s}  Labs/ tests ordered today include: *** No orders of the defined types were placed in this encounter.    Disposition:   FU with ***  Signed, Lenoard Aden  10/01/2016 8:32 AM    [facility] Phone: [phone];  Fax: [phone]  This note was written with the assistance of speech recognition software. Please excuse any transcriptional errors.

## 2016-10-08 ENCOUNTER — Ambulatory Visit (INDEPENDENT_AMBULATORY_CARE_PROVIDER_SITE_OTHER): Payer: PPO | Admitting: *Deleted

## 2016-10-08 DIAGNOSIS — Z7901 Long term (current) use of anticoagulants: Secondary | ICD-10-CM

## 2016-10-08 DIAGNOSIS — D649 Anemia, unspecified: Secondary | ICD-10-CM

## 2016-10-08 DIAGNOSIS — D519 Vitamin B12 deficiency anemia, unspecified: Secondary | ICD-10-CM | POA: Diagnosis not present

## 2016-10-08 NOTE — Progress Notes (Signed)
Patient presents for recheck labs per [facility], PA-C orders.

## 2016-10-09 LAB — CBC WITH DIFFERENTIAL/PLATELET
Basophils Absolute: 0 cells/uL (ref 0–200)
Basophils Relative: 0 %
EOS ABS: 70 {cells}/uL (ref 15–500)
Eosinophils Relative: 1 %
HEMATOCRIT: 33 % — AB (ref 35.0–45.0)
Hemoglobin: 9.2 g/dL — ABNORMAL LOW (ref 11.7–15.5)
LYMPHS PCT: 17 %
Lymphs Abs: 1190 cells/uL (ref 850–3900)
MCH: 21.8 pg — AB (ref 27.0–33.0)
MCHC: 28 g/dL — ABNORMAL LOW (ref 32.0–36.0)
MCV: 78.2 fL — AB (ref 80.0–100.0)
MONO ABS: 490 {cells}/uL (ref 200–950)
MONOS PCT: 7 %
MPV: 9.1 fL (ref 7.5–12.5)
Neutro Abs: 5250 cells/uL (ref 1500–7800)
Neutrophils Relative %: 75 %
PLATELETS: 283 10*3/uL (ref 140–400)
RBC: 4.22 MIL/uL (ref 3.80–5.10)
RDW: 17.2 % — AB (ref 11.0–15.0)
WBC: 7 10*3/uL (ref 3.8–10.8)

## 2016-10-09 LAB — VITAMIN B12: Vitamin B-12: 359 pg/mL (ref 200–1100)

## 2016-10-09 LAB — IRON AND TIBC
%SAT: 4 % — AB (ref 11–50)
Iron: 23 ug/dL — ABNORMAL LOW (ref 45–160)
TIBC: 619 ug/dL — ABNORMAL HIGH (ref 250–450)
UIBC: 596 ug/dL — ABNORMAL HIGH (ref 125–400)

## 2016-10-09 LAB — FERRITIN: Ferritin: 14 ng/mL — ABNORMAL LOW (ref 20–288)

## 2016-10-09 LAB — PROTIME-INR
INR: 1.3 — ABNORMAL HIGH
Prothrombin Time: 13.4 s — ABNORMAL HIGH (ref 9.0–11.5)

## 2016-10-09 LAB — RETICULOCYTES
ABS RETIC: 105500 {cells}/uL — AB (ref 20000–80000)
RBC.: 4.22 MIL/uL (ref 3.80–5.10)
Retic Ct Pct: 2.5 %

## 2016-10-11 DIAGNOSIS — J449 Chronic obstructive pulmonary disease, unspecified: Secondary | ICD-10-CM | POA: Diagnosis not present

## 2016-10-11 DIAGNOSIS — J9610 Chronic respiratory failure, unspecified whether with hypoxia or hypercapnia: Secondary | ICD-10-CM | POA: Diagnosis not present

## 2016-10-14 ENCOUNTER — Ambulatory Visit (HOSPITAL_BASED_OUTPATIENT_CLINIC_OR_DEPARTMENT_OTHER): Payer: PPO

## 2016-10-14 ENCOUNTER — Other Ambulatory Visit: Payer: Self-pay | Admitting: Internal Medicine

## 2016-10-14 DIAGNOSIS — J449 Chronic obstructive pulmonary disease, unspecified: Secondary | ICD-10-CM | POA: Diagnosis not present

## 2016-10-18 ENCOUNTER — Ambulatory Visit (INDEPENDENT_AMBULATORY_CARE_PROVIDER_SITE_OTHER): Payer: PPO | Admitting: Physician Assistant

## 2016-10-18 ENCOUNTER — Encounter: Payer: Self-pay | Admitting: Physician Assistant

## 2016-10-18 VITALS — BP 120/79 | HR 127 | Ht 65.0 in | Wt 257.4 lb

## 2016-10-18 DIAGNOSIS — D508 Other iron deficiency anemias: Secondary | ICD-10-CM

## 2016-10-18 DIAGNOSIS — I10 Essential (primary) hypertension: Secondary | ICD-10-CM

## 2016-10-18 DIAGNOSIS — I471 Supraventricular tachycardia: Secondary | ICD-10-CM

## 2016-10-18 DIAGNOSIS — I5032 Chronic diastolic (congestive) heart failure: Secondary | ICD-10-CM

## 2016-10-18 NOTE — Progress Notes (Signed)
Cardiology Office Note   Date:  10/18/2016   ID:  Jillian Wood, DOB 1946-01-28, MRN [id number]  PCP:  Alesia Richards, MD  Cardiologist:  Dr. Gwenlyn Found 12/02/2014 office visit, 07/2016 in the hospital EP: Dr. Brayton Mars, PA-C 08/28/2016  Chief Complaint  Patient presents with  . Follow-up    4-6 weeks; Pt states no Sx.     History of Present Illness: Jillian Wood is a 71 y.o. female with a history of  HTN, PSVT, chest pain, COPD on O2, tob use, DM, HLD, GERD, chronic D-CHF, AS, quit tobacco 07/2016. Right bundle branch block  08/28/2016 office visit after hospitalization admission for COPD exacerbation and diagnosed with atrial flutter on metoprolol, Cardizem and Coumadin. CHA2DS2VASc=5 (age x 1, female, DM, HTN, CHF). Echo w/ EF nl, PAS elevated, AS mild, LA dilatation was severe. Patient was volume overloaded, started on Lasix 20 mg a day  Jillian Wood presents for cardiology follow up  She ended up on Lasix 80 mg and metolazone, but on 01/08 PCP visit, the Lasix was cut to 40 mg and the metolazone was d/c'd. She has stayed off the metolazone but went back up on the LasixTo 80 mg daily about a week ago.   She has LE edema, mostly during the day, but has a small amount on waking. Her lower extremity edema seems to be at baseline.Her weight has been 256-257 lbs on her home scale every morning.   She struggles with both her breathing and her MS pain. She sometimes will lean forward and be able to get a deeper breath, then walk more.   When asked to define her respiratory baseline, she struggles with this. She references back to when she was not on oxygen in 2010. Since then, her respiratory status has been poor. She did not quit smoking until a few months ago, worsening her respiratory status. It is unfortunate, but I believe her current respiratory status is her baseline. She is not wheezing.  She has not had palpitations and does not believe she has had  any more atrial flutter or PSVT. She is not had any bleeding issues   Past Medical History:  Diagnosis Date  . Arthritis   . COPD (chronic obstructive pulmonary disease) ([place])   . Depression   . Diabetes mellitus type 2 in obese ([place])   . GERD (gastroesophageal reflux disease)   . Hyperlipidemia   . Hypertension   . Morbid obesity ([place])   . OAB (overactive bladder)   . PSVT (paroxysmal supraventricular tachycardia) ([place])   . Thyroid disease   . Vitamin D deficiency     Past Surgical History:  Procedure Laterality Date  . CARPAL TUNNEL RELEASE     L 1992 R 1984  . EYE SURGERY Bilateral    cataract  . NM MYOVIEW LTD  01/2011   Dobutamine Myoview: Negative perfusion scan for ischemia / infarct (poor image capture); Pt developed SVT with Dobutamine that reproduced her CP as it resolved with restoration of NSR.  Marland Kitchen PUBOVAGINAL SLING    . TONSILLECTOMY AND ADENOIDECTOMY    . TRANSTHORACIC ECHOCARDIOGRAM  01/2011   Hyperdynamic LV with EF 65-70%, Gr 1 DD, Mild Ao Stenosis - mean gradient ~19 mmHg    Current Outpatient Prescriptions  Medication Sig Dispense Refill  . acetaminophen-codeine (TYLENOL #3) 300-30 MG per tablet Take 1 tablet by mouth every 8 (eight) hours as needed for moderate pain or severe pain. 90 tablet 0  .  acetaZOLAMIDE (DIAMOX) 125 MG tablet Take 1 tablet 3 x/ day 270 tablet 1  . albuterol (PROVENTIL HFA;VENTOLIN HFA) 108 (90 Base) MCG/ACT inhaler Inhale 1-2 puffs into the lungs every 6 (six) hours as needed for wheezing or shortness of breath. 18 g 2  . aspirin 81 MG tablet Take 81 mg by mouth daily.    . budesonide-formoterol (SYMBICORT) 160-4.5 MCG/ACT inhaler 2 puffs twice a day, wash mouth afterwards 1 Inhaler 0  . Cholecalciferol (VITAMIN D PO) Take 2,000 Units by mouth daily.     Marland Kitchen diltiazem (CARDIZEM CD) 300 MG 24 hr capsule Take 1 capsule (300 mg total) by mouth daily. 30 capsule 2  . DULoxetine (CYMBALTA) 60 MG capsule TAKE 1 CAPSULE BY MOUTH DAILY 90  capsule 1  . famotidine (PEPCID) 20 MG tablet TAKE 1 TABLET (20 MG TOTAL) BY MOUTH 2 (TWO) TIMES DAILY. FOR ACID REFLUX 180 tablet 0  . furosemide (LASIX) 80 MG tablet Take 1 tablet (80 mg total) by mouth daily. 90 tablet 0  . guaiFENesin (MUCINEX) 600 MG 12 hr tablet Take 600 mg by mouth daily.    Marland Kitchen levothyroxine (SYNTHROID, LEVOTHROID) 175 MCG tablet TAKE 1 TABLET (175 MCG TOTAL) BY MOUTH DAILY BEFORE BREAKFAST. 90 tablet 0  . loratadine-pseudoephedrine (CLARITIN-D 24-HOUR) 10-240 MG per 24 hr tablet Take 1 tablet by mouth as needed for allergies.     Marland Kitchen losartan (COZAAR) 50 MG tablet TAKE 1 TABLET BY MOUTH DAILY 90 tablet 2  . Magnesium Chloride (MAGNESIUM DR PO) Take 1 capsule by mouth daily.     . metFORMIN (GLUCOPHAGE-XR) 500 MG 24 hr tablet TAKE 1 TABLET (500 MG TOTAL) BY MOUTH 4 (FOUR) TIMES DAILY - AFTER MEALS AND AT BEDTIME. 360 tablet 1  . metolazone (ZAROXOLYN) 5 MG tablet TAKE 1 TABLET (5 MG TOTAL) BY MOUTH DAILY. 30 tablet 0  . metoprolol tartrate (LOPRESSOR) 25 MG tablet Take 1 tablet (25 mg total) by mouth 2 (two) times daily. 60 tablet 2  . OXYGEN-HELIUM IN Inhale 5 L into the lungs.     . pravastatin (PRAVACHOL) 40 MG tablet TAKE 1 TABLET (40 MG TOTAL) BY MOUTH DAILY. 90 tablet 1  . warfarin (COUMADIN) 5 MG tablet Take 1 tablet (5 mg total) by mouth one time only at 6 PM. 30 tablet 0   No current facility-administered medications for this visit.     Allergies:   Inderal [propranolol] and Ace inhibitors    Social History:  The patient  reports that she quit smoking about 2 months ago. Her smoking use included Cigarettes. She has a 22.00 pack-year smoking history. She has never used smokeless tobacco. She reports that she does not drink alcohol or use drugs.   Family History:  The patient's family history includes Alcohol abuse in her father; Heart disease in her father.    ROS:  Please see the history of present illness. All other systems are reviewed and negative.     PHYSICAL EXAM: VS:  BP 120/79   Pulse (!) 127   Ht 5\' 5"  (1.651 m)   Wt 257 lb 6.4 oz (116.8 kg)   BMI 42.83 kg/m  , BMI Body mass index is 42.83 kg/m. GEN: Well nourished, well developed, female in no acute distress  HEENT: normal for age  Neck: Minimal JVD, no carotid bruit, no masses Cardiac: Slightly irregular R&R; 2/6 murmur, no rubs, or gallops Respiratory:  Decreased breath sounds bases bilaterally, normal work of breathing GI: soft, nontender, nondistended, +  BS MS: no deformity or atrophy; trace lower extremity edema; distal pulses are 2+ in all 4 extremities   Skin: warm and dry, no rash Neuro:  Strength and sensation are intact Psych: euthymic mood, full affect   EKG:  EKG is ordered today. The ekg ordered today demonstrates sinus rhythm with frequent PVCs, poor quality. Bigeminy at one point. RBBB   Recent Labs: 07/31/2016: B Natriuretic Peptide 448.9 08/04/2016: Magnesium 2.1 09/23/2016: ALT 7; BUN 19; Creat 0.81; Potassium 3.6; Sodium 143; TSH 1.88 10/08/2016: Hemoglobin 9.2; Platelets 283    Lipid Panel    Component Value Date/Time   CHOL 180 06/06/2016 1524   TRIG 187 (H) 06/06/2016 1524   HDL 75 06/06/2016 1524   CHOLHDL 2.4 06/06/2016 1524   VLDL 37 (H) 06/06/2016 1524   LDLCALC 68 06/06/2016 1524     Wt Readings from Last 3 Encounters:  10/18/16 257 lb 6.4 oz (116.8 kg)  09/23/16 250 lb (113.4 kg)  08/28/16 261 lb (118.4 kg)     Other studies Reviewed: Additional studies/ records that were reviewed today include: Office notes, hospital records and testing.  ASSESSMENT AND PLAN:  1.  PSVT/PAF: She has not had any palpitations. She is in sinus rhythm with PVCs today continue current metoprolol dose and Cardizem. Her heart rate was elevated when she first got here, but her exercise tolerance is poor and I believe that was the problem.  2. Chronic diastolic CHF: Her weight is up on our scales over the past month, but she states this is her  baseline per her home scales. She is encouraged to continue daily weights on her home scales and let us know if she is trending up or staying the same. She is to stay on Lasix at 80 mg daily with Diamox. I do not believe a lower dose will keep her diuresed. Her PCP office has given her instructions to contact them if she gains 2 pounds or more and she is encouraged to do this.  3. Anemia: She has chronic anemia and is iron deficient. We discussed this. Anemia can contribute to her shortness of breath, but she is not aware of any ongoing blood loss. She is to follow-up with her PCP for this.  4. CODE STATUS: The patient has been a full code in the past. I suggested that she discuss that her family and make a decision. The decision is hers, but I encouraged her to share her decision with her family. She feels that she should be a no code, but will discuss it with her family before getting anything signed.   Current medicines are reviewed at length with the patient today.  The patient does not have concerns regarding medicines.  The following changes have been made:  Continue Lasix at the current dose  Labs/ tests ordered today include:  No orders of the defined types were placed in this encounter.    Disposition:   FU with Dr. Gwenlyn Found  Signed, Barrett, [address]  10/18/2016 2:57 PM    [place] Phone: [phone]; Fax: [phone]  This note was written with the assistance of speech recognition software. Please excuse any transcriptional errors.

## 2016-10-18 NOTE — Patient Instructions (Addendum)
Your physician recommends that you continue on your current medications as directed. Please refer to the Current Medication list given to you today. -- continue lasix 80mg  daily  Monitor daily weights  Follow a low sodium (2000mg /day) diet & carb-modified diet  -- references provided  Your physician recommends that you schedule a follow-up appointment in December 06, 2016 with Dr. Gwenlyn Found

## 2016-10-19 DIAGNOSIS — J449 Chronic obstructive pulmonary disease, unspecified: Secondary | ICD-10-CM | POA: Diagnosis not present

## 2016-10-22 ENCOUNTER — Other Ambulatory Visit: Payer: Self-pay | Admitting: Internal Medicine

## 2016-10-23 ENCOUNTER — Other Ambulatory Visit: Payer: Self-pay | Admitting: Internal Medicine

## 2016-10-29 ENCOUNTER — Other Ambulatory Visit: Payer: Self-pay | Admitting: Internal Medicine

## 2016-10-29 MED ORDER — AZITHROMYCIN 250 MG PO TABS: ORAL_TABLET | ORAL | 0 refills | Status: DC

## 2016-10-29 MED ORDER — FLUTICASONE PROPIONATE 50 MCG/ACT NA SUSP: 2.0000 | Freq: Every day | NASAL | 0 refills | Status: AC

## 2016-10-29 MED ORDER — PREDNISONE 20 MG PO TABS: ORAL_TABLET | ORAL | 0 refills | Status: DC

## 2016-11-07 ENCOUNTER — Encounter: Payer: Self-pay | Admitting: Internal Medicine

## 2016-11-11 ENCOUNTER — Inpatient Hospital Stay (HOSPITAL_COMMUNITY)
Admission: EM | Admit: 2016-11-11 | Discharge: 2016-11-15 | DRG: 291 | Disposition: A | Discharge status: Home Health | Payer: PPO | Attending: Internal Medicine | Admitting: Internal Medicine

## 2016-11-11 ENCOUNTER — Encounter: Payer: Self-pay | Admitting: Internal Medicine

## 2016-11-11 ENCOUNTER — Encounter (HOSPITAL_COMMUNITY): Payer: Self-pay | Admitting: Internal Medicine

## 2016-11-11 ENCOUNTER — Encounter (HOSPITAL_COMMUNITY): Payer: Self-pay | Admitting: *Deleted

## 2016-11-11 ENCOUNTER — Emergency Department (HOSPITAL_COMMUNITY): Payer: PPO

## 2016-11-11 ENCOUNTER — Ambulatory Visit (INDEPENDENT_AMBULATORY_CARE_PROVIDER_SITE_OTHER): Payer: PPO | Admitting: Internal Medicine

## 2016-11-11 VITALS — BP 132/90 | HR 108 | Temp 97.5°F | Resp 16 | Ht 65.0 in | Wt 267.6 lb

## 2016-11-11 DIAGNOSIS — K219 Gastro-esophageal reflux disease without esophagitis: Secondary | ICD-10-CM | POA: Diagnosis not present

## 2016-11-11 DIAGNOSIS — E039 Hypothyroidism, unspecified: Secondary | ICD-10-CM

## 2016-11-11 DIAGNOSIS — I5033 Acute on chronic diastolic (congestive) heart failure: Secondary | ICD-10-CM | POA: Diagnosis present

## 2016-11-11 DIAGNOSIS — Z9981 Dependence on supplemental oxygen: Secondary | ICD-10-CM

## 2016-11-11 DIAGNOSIS — E662 Morbid (severe) obesity with alveolar hypoventilation: Secondary | ICD-10-CM | POA: Diagnosis not present

## 2016-11-11 DIAGNOSIS — I350 Nonrheumatic aortic (valve) stenosis: Secondary | ICD-10-CM | POA: Diagnosis not present

## 2016-11-11 DIAGNOSIS — E872 Acidosis: Secondary | ICD-10-CM | POA: Diagnosis present

## 2016-11-11 DIAGNOSIS — E119 Type 2 diabetes mellitus without complications: Secondary | ICD-10-CM | POA: Diagnosis present

## 2016-11-11 DIAGNOSIS — I4510 Unspecified right bundle-branch block: Secondary | ICD-10-CM | POA: Diagnosis present

## 2016-11-11 DIAGNOSIS — I4892 Unspecified atrial flutter: Secondary | ICD-10-CM | POA: Diagnosis present

## 2016-11-11 DIAGNOSIS — Z7901 Long term (current) use of anticoagulants: Secondary | ICD-10-CM | POA: Diagnosis not present

## 2016-11-11 DIAGNOSIS — Z136 Encounter for screening for cardiovascular disorders: Secondary | ICD-10-CM

## 2016-11-11 DIAGNOSIS — N183 Chronic kidney disease, stage 3 unspecified: Secondary | ICD-10-CM

## 2016-11-11 DIAGNOSIS — D649 Anemia, unspecified: Secondary | ICD-10-CM | POA: Diagnosis not present

## 2016-11-11 DIAGNOSIS — Z6841 Body Mass Index (BMI) 40.0 and over, adult: Secondary | ICD-10-CM | POA: Diagnosis not present

## 2016-11-11 DIAGNOSIS — I10 Essential (primary) hypertension: Secondary | ICD-10-CM | POA: Diagnosis present

## 2016-11-11 DIAGNOSIS — N3281 Overactive bladder: Secondary | ICD-10-CM | POA: Diagnosis present

## 2016-11-11 DIAGNOSIS — I480 Paroxysmal atrial fibrillation: Secondary | ICD-10-CM

## 2016-11-11 DIAGNOSIS — J449 Chronic obstructive pulmonary disease, unspecified: Secondary | ICD-10-CM

## 2016-11-11 DIAGNOSIS — J441 Chronic obstructive pulmonary disease with (acute) exacerbation: Secondary | ICD-10-CM | POA: Diagnosis not present

## 2016-11-11 DIAGNOSIS — E785 Hyperlipidemia, unspecified: Secondary | ICD-10-CM | POA: Diagnosis present

## 2016-11-11 DIAGNOSIS — Z87891 Personal history of nicotine dependence: Secondary | ICD-10-CM

## 2016-11-11 DIAGNOSIS — Z0000 Encounter for general adult medical examination without abnormal findings: Secondary | ICD-10-CM

## 2016-11-11 DIAGNOSIS — I110 Hypertensive heart disease with heart failure: Principal | ICD-10-CM | POA: Diagnosis present

## 2016-11-11 DIAGNOSIS — E782 Mixed hyperlipidemia: Secondary | ICD-10-CM

## 2016-11-11 DIAGNOSIS — Z993 Dependence on wheelchair: Secondary | ICD-10-CM

## 2016-11-11 DIAGNOSIS — Z0001 Encounter for general adult medical examination with abnormal findings: Secondary | ICD-10-CM

## 2016-11-11 DIAGNOSIS — I420 Dilated cardiomyopathy: Secondary | ICD-10-CM | POA: Diagnosis present

## 2016-11-11 DIAGNOSIS — I483 Typical atrial flutter: Secondary | ICD-10-CM

## 2016-11-11 DIAGNOSIS — J90 Pleural effusion, not elsewhere classified: Secondary | ICD-10-CM | POA: Diagnosis not present

## 2016-11-11 DIAGNOSIS — J9621 Acute and chronic respiratory failure with hypoxia: Secondary | ICD-10-CM | POA: Diagnosis not present

## 2016-11-11 DIAGNOSIS — Z811 Family history of alcohol abuse and dependence: Secondary | ICD-10-CM

## 2016-11-11 DIAGNOSIS — I272 Pulmonary hypertension, unspecified: Secondary | ICD-10-CM | POA: Diagnosis not present

## 2016-11-11 DIAGNOSIS — I4891 Unspecified atrial fibrillation: Secondary | ICD-10-CM

## 2016-11-11 DIAGNOSIS — E559 Vitamin D deficiency, unspecified: Secondary | ICD-10-CM | POA: Diagnosis present

## 2016-11-11 DIAGNOSIS — Z8249 Family history of ischemic heart disease and other diseases of the circulatory system: Secondary | ICD-10-CM

## 2016-11-11 DIAGNOSIS — Z7982 Long term (current) use of aspirin: Secondary | ICD-10-CM

## 2016-11-11 DIAGNOSIS — Z7984 Long term (current) use of oral hypoglycemic drugs: Secondary | ICD-10-CM

## 2016-11-11 DIAGNOSIS — Z1212 Encounter for screening for malignant neoplasm of rectum: Secondary | ICD-10-CM

## 2016-11-11 DIAGNOSIS — J9610 Chronic respiratory failure, unspecified whether with hypoxia or hypercapnia: Secondary | ICD-10-CM | POA: Diagnosis not present

## 2016-11-11 DIAGNOSIS — I509 Heart failure, unspecified: Secondary | ICD-10-CM

## 2016-11-11 DIAGNOSIS — Z79899 Other long term (current) drug therapy: Secondary | ICD-10-CM

## 2016-11-11 DIAGNOSIS — Z888 Allergy status to other drugs, medicaments and biological substances status: Secondary | ICD-10-CM

## 2016-11-11 DIAGNOSIS — E1122 Type 2 diabetes mellitus with diabetic chronic kidney disease: Secondary | ICD-10-CM

## 2016-11-11 DIAGNOSIS — Z7951 Long term (current) use of inhaled steroids: Secondary | ICD-10-CM

## 2016-11-11 LAB — COMPREHENSIVE METABOLIC PANEL
ALBUMIN: 3.4 g/dL — AB (ref 3.5–5.0)
ALT: 15 U/L (ref 14–54)
AST: 18 U/L (ref 15–41)
Alkaline Phosphatase: 73 U/L (ref 38–126)
Anion gap: 8 (ref 5–15)
BILIRUBIN TOTAL: 0.6 mg/dL (ref 0.3–1.2)
BUN: 15 mg/dL (ref 6–20)
CO2: 33 mmol/L — ABNORMAL HIGH (ref 22–32)
Calcium: 8.9 mg/dL (ref 8.9–10.3)
Chloride: 100 mmol/L — ABNORMAL LOW (ref 101–111)
Creatinine, Ser: 0.7 mg/dL (ref 0.44–1.00)
GFR calc Af Amer: >60 mL/min (ref >60)
GFR calc non Af Amer: >60 mL/min (ref >60)
Glucose, Bld: 119 mg/dL — ABNORMAL HIGH (ref 65–99)
POTASSIUM: 4.5 mmol/L (ref 3.5–5.1)
Sodium: 141 mmol/L (ref 135–145)
TOTAL PROTEIN: 6.4 g/dL — AB (ref 6.5–8.1)

## 2016-11-11 LAB — I-STAT CG4 LACTIC ACID, ED
LACTIC ACID, VENOUS: 1.36 mmol/L (ref 0.5–1.9)
Lactic Acid, Venous: 1.99 mmol/L (ref 0.5–1.9)

## 2016-11-11 LAB — CBC WITH DIFFERENTIAL/PLATELET
BASOS PCT: 0 %
Basophils Absolute: 0 10*3/uL (ref 0.0–0.1)
Eosinophils Absolute: 0.1 10*3/uL (ref 0.0–0.7)
Eosinophils Relative: 1 %
HEMATOCRIT: 38.7 % (ref 36.0–46.0)
HEMOGLOBIN: 10.3 g/dL — AB (ref 12.0–15.0)
Lymphocytes Relative: 16 %
Lymphs Abs: 1.4 10*3/uL (ref 0.7–4.0)
MCH: 22.1 pg — ABNORMAL LOW (ref 26.0–34.0)
MCHC: 26.6 g/dL — ABNORMAL LOW (ref 30.0–36.0)
MCV: 83 fL (ref 78.0–100.0)
MONO ABS: 0.8 10*3/uL (ref 0.1–1.0)
Monocytes Relative: 9 %
NEUTROS ABS: 6.7 10*3/uL (ref 1.7–7.7)
NEUTROS PCT: 74 %
Platelets: 323 10*3/uL (ref 150–400)
RBC: 4.66 MIL/uL (ref 3.87–5.11)
RDW: 18.6 % — AB (ref 11.5–15.5)
WBC: 9 10*3/uL (ref 4.0–10.5)

## 2016-11-11 LAB — PROTIME-INR
INR: 1.25
PROTHROMBIN TIME: 15.8 s — AB (ref 11.4–15.2)

## 2016-11-11 LAB — I-STAT TROPONIN, ED: Troponin i, poc: 0.01 ng/mL (ref 0.00–0.08)

## 2016-11-11 LAB — BRAIN NATRIURETIC PEPTIDE: B Natriuretic Peptide: 352.4 pg/mL — ABNORMAL HIGH (ref 0.0–100.0)

## 2016-11-11 MED ORDER — DULOXETINE HCL 60 MG PO CPEP
60.0000 mg | ORAL_CAPSULE | Freq: Every day | ORAL | Status: DC
  Administered 2016-11-12 – 2016-11-15 (×4): 60 mg via ORAL
  Filled 2016-11-11 (×4): qty 1

## 2016-11-11 MED ORDER — PRAVASTATIN SODIUM 40 MG PO TABS
40.0000 mg | ORAL_TABLET | Freq: Every day | ORAL | Status: DC
  Administered 2016-11-12 – 2016-11-14 (×4): 40 mg via ORAL
  Filled 2016-11-11 (×4): qty 1

## 2016-11-11 MED ORDER — METOPROLOL TARTRATE 25 MG PO TABS
25.0000 mg | ORAL_TABLET | Freq: Two times a day (BID) | ORAL | Status: DC
  Administered 2016-11-12 (×2): 25 mg via ORAL
  Filled 2016-11-11 (×2): qty 1

## 2016-11-11 MED ORDER — ACETAMINOPHEN 650 MG RE SUPP: 650.0000 mg | Freq: Four times a day (QID) | RECTAL | Status: DC | PRN

## 2016-11-11 MED ORDER — ASPIRIN 81 MG PO CHEW
81.0000 mg | CHEWABLE_TABLET | Freq: Every day | ORAL | Status: DC
  Administered 2016-11-12 – 2016-11-15 (×4): 81 mg via ORAL
  Filled 2016-11-11 (×4): qty 1

## 2016-11-11 MED ORDER — ONDANSETRON HCL 4 MG PO TABS: 4.0000 mg | ORAL_TABLET | Freq: Four times a day (QID) | ORAL | Status: DC | PRN

## 2016-11-11 MED ORDER — POTASSIUM CHLORIDE CRYS ER 20 MEQ PO TBCR
20.0000 meq | EXTENDED_RELEASE_TABLET | Freq: Every day | ORAL | Status: DC
  Administered 2016-11-12 – 2016-11-15 (×4): 20 meq via ORAL
  Filled 2016-11-11 (×4): qty 1

## 2016-11-11 MED ORDER — LEVALBUTEROL HCL 0.63 MG/3ML IN NEBU
0.6300 mg | INHALATION_SOLUTION | RESPIRATORY_TRACT | Status: DC
  Administered 2016-11-12 (×6): 0.63 mg via RESPIRATORY_TRACT
  Filled 2016-11-11 (×6): qty 3

## 2016-11-11 MED ORDER — DILTIAZEM HCL ER COATED BEADS 300 MG PO CP24
300.0000 mg | ORAL_CAPSULE | Freq: Every day | ORAL | Status: DC
  Administered 2016-11-12 – 2016-11-15 (×4): 300 mg via ORAL
  Filled 2016-11-11 (×4): qty 1

## 2016-11-11 MED ORDER — FERROUS SULFATE 325 (65 FE) MG PO TABS
325.0000 mg | ORAL_TABLET | Freq: Two times a day (BID) | ORAL | Status: DC
  Administered 2016-11-12 – 2016-11-15 (×7): 325 mg via ORAL
  Filled 2016-11-11 (×7): qty 1

## 2016-11-11 MED ORDER — FAMOTIDINE 20 MG PO TABS
20.0000 mg | ORAL_TABLET | Freq: Two times a day (BID) | ORAL | Status: DC
  Administered 2016-11-12 – 2016-11-15 (×8): 20 mg via ORAL
  Filled 2016-11-11 (×8): qty 1

## 2016-11-11 MED ORDER — ONDANSETRON HCL 4 MG/2ML IJ SOLN: 4.0000 mg | Freq: Four times a day (QID) | INTRAMUSCULAR | Status: DC | PRN

## 2016-11-11 MED ORDER — LOSARTAN POTASSIUM 50 MG PO TABS
50.0000 mg | ORAL_TABLET | Freq: Every day | ORAL | Status: DC
  Administered 2016-11-12: 50 mg via ORAL
  Filled 2016-11-11: qty 1

## 2016-11-11 MED ORDER — BUDESONIDE 0.25 MG/2ML IN SUSP
0.2500 mg | Freq: Two times a day (BID) | RESPIRATORY_TRACT | Status: DC
  Administered 2016-11-11 – 2016-11-15 (×7): 0.25 mg via RESPIRATORY_TRACT
  Filled 2016-11-11 (×9): qty 2

## 2016-11-11 MED ORDER — LEVALBUTEROL HCL 0.63 MG/3ML IN NEBU
0.6300 mg | INHALATION_SOLUTION | RESPIRATORY_TRACT | Status: DC | PRN
  Administered 2016-11-13: 0.63 mg via RESPIRATORY_TRACT
  Filled 2016-11-11: qty 3

## 2016-11-11 MED ORDER — FLUTICASONE PROPIONATE 50 MCG/ACT NA SUSP: 2.0000 | Freq: Every day | NASAL | Status: DC | PRN

## 2016-11-11 MED ORDER — FUROSEMIDE 10 MG/ML IJ SOLN
80.0000 mg | Freq: Every day | INTRAMUSCULAR | Status: DC
  Administered 2016-11-11: 80 mg via INTRAVENOUS
  Filled 2016-11-11: qty 8

## 2016-11-11 MED ORDER — DILTIAZEM HCL 25 MG/5ML IV SOLN
10.0000 mg | Freq: Once | INTRAVENOUS | Status: AC
  Administered 2016-11-11: 10 mg via INTRAVENOUS
  Filled 2016-11-11: qty 5

## 2016-11-11 MED ORDER — DILTIAZEM HCL 25 MG/5ML IV SOLN
5.0000 mg | Freq: Once | INTRAVENOUS | Status: AC
  Administered 2016-11-11: 5 mg via INTRAVENOUS
  Filled 2016-11-11: qty 5

## 2016-11-11 MED ORDER — DILTIAZEM LOAD VIA INFUSION
10.0000 mg | Freq: Once | INTRAVENOUS | Status: DC
  Filled 2016-11-11: qty 10

## 2016-11-11 MED ORDER — GUAIFENESIN ER 600 MG PO TB12
600.0000 mg | ORAL_TABLET | Freq: Every day | ORAL | Status: DC
Start: 2016-11-12 — End: 2016-11-15
  Administered 2016-11-12 – 2016-11-15 (×4): 600 mg via ORAL
  Filled 2016-11-11 (×4): qty 1

## 2016-11-11 MED ORDER — ACETAZOLAMIDE 250 MG PO TABS
125.0000 mg | ORAL_TABLET | Freq: Three times a day (TID) | ORAL | Status: DC
  Filled 2016-11-11 (×2): qty 1

## 2016-11-11 MED ORDER — ACETAMINOPHEN 325 MG PO TABS
650.0000 mg | ORAL_TABLET | Freq: Four times a day (QID) | ORAL | Status: DC | PRN
  Administered 2016-11-14: 650 mg via ORAL
  Filled 2016-11-11 (×2): qty 2

## 2016-11-11 MED ORDER — INSULIN ASPART 100 UNIT/ML ~~LOC~~ SOLN
0.0000 [IU] | Freq: Three times a day (TID) | SUBCUTANEOUS | Status: DC
  Administered 2016-11-12: 2 [IU] via SUBCUTANEOUS
  Administered 2016-11-12 – 2016-11-13 (×3): 1 [IU] via SUBCUTANEOUS
  Administered 2016-11-14: 2 [IU] via SUBCUTANEOUS
  Administered 2016-11-14: 1 [IU] via SUBCUTANEOUS

## 2016-11-11 MED ORDER — LEVOTHYROXINE SODIUM 175 MCG PO TABS
175.0000 ug | ORAL_TABLET | Freq: Every day | ORAL | Status: DC
  Administered 2016-11-12 – 2016-11-15 (×4): 175 ug via ORAL
  Filled 2016-11-11 (×4): qty 1

## 2016-11-11 MED ORDER — WARFARIN - PHARMACIST DOSING INPATIENT
Freq: Every day | Status: DC
  Administered 2016-11-13: 18:00:00

## 2016-11-11 MED ORDER — WARFARIN SODIUM 5 MG PO TABS
6.0000 mg | ORAL_TABLET | ORAL | Status: AC
  Administered 2016-11-12: 6 mg via ORAL
  Filled 2016-11-11: qty 1

## 2016-11-11 NOTE — Patient Instructions (Signed)

## 2016-11-11 NOTE — H&P (Addendum)
History and Physical    Jillian HIBBERT [id number] DOB: 02/20/1946 DOA: 11/11/2016  PCP: Alesia Richards, MD  Patient coming from: Home.  Chief Complaint: Tachycardia.  HPI: Jillian Wood is a 71 y.o. female with atrial fibrillation, COPD, hypertension, diabetes mellitus, chronic anemia, sleep apnea was referred to the ER by patient's primary care physician after patient was found to be tachycardic. Patient denies any chest pain palpitations. Patient is usually wheelchair-bound and denies any shortness of breath as such. Patient was recently treated for COPD exacerbation on prednisone and antibiotics.   ED Course: In the ER patient was found to be easily short of breath on minimal exertion. Has gained15 pounds over the last 1 week. Was found to be in A. fib with RVR. Patient was given 1 dose of Cardizem 5 mg IV following which heart rate improved but with exertion patient again went into RVR. Chest x-ray shows congestion.  Review of Systems: As per HPI, rest all negative.   Past Medical History:  Diagnosis Date  . Arthritis   . COPD (chronic obstructive pulmonary disease) ([place])   . Depression   . Diabetes mellitus type 2 in obese ([place])   . GERD (gastroesophageal reflux disease)   . Hyperlipidemia   . Hypertension   . Morbid obesity ([place])   . OAB (overactive bladder)   . PSVT (paroxysmal supraventricular tachycardia) ([place])   . Thyroid disease   . Vitamin D deficiency     Past Surgical History:  Procedure Laterality Date  . CARPAL TUNNEL RELEASE     L 1992 R 1984  . EYE SURGERY Bilateral    cataract  . NM MYOVIEW LTD  01/2011   Dobutamine Myoview: Negative perfusion scan for ischemia / infarct (poor image capture); Pt developed SVT with Dobutamine that reproduced her CP as it resolved with restoration of NSR.  Marland Kitchen PUBOVAGINAL SLING    . TONSILLECTOMY AND ADENOIDECTOMY    . TRANSTHORACIC ECHOCARDIOGRAM  01/2011   Hyperdynamic LV with EF 65-70%, Gr 1 DD, Mild Ao  Stenosis - mean gradient ~19 mmHg     reports that she quit smoking about 2 months ago. Her smoking use included Cigarettes. She has a 22.00 pack-year smoking history. She has never used smokeless tobacco. She reports that she does not drink alcohol or use drugs.  Allergies  Allergen Reactions  . Inderal [Propranolol] Other (See Comments)    Hair loss  . Ace Inhibitors Cough    Family History  Problem Relation Age of Onset  . Alcohol abuse Father   . Heart disease Father     Prior to Admission medications   Medication Sig Start Date End Date Taking? Authorizing Provider  acetaminophen-codeine (TYLENOL #3) 300-30 MG per tablet Take 1 tablet by mouth every 8 (eight) hours as needed for moderate pain or severe pain. 06/07/15  Yes [name], PA-C  acetaZOLAMIDE (DIAMOX) 125 MG tablet Take 1 tablet 3 x/ day Patient taking differently: Take 125 mg by mouth 3 (three) times daily.  08/13/16 02/10/17 Yes Unk Pinto, MD  albuterol (PROVENTIL HFA;VENTOLIN HFA) 108 (90 Base) MCG/ACT inhaler Inhale 1-2 puffs into the lungs every 6 (six) hours as needed for wheezing or shortness of breath. 06/06/16  Yes Courtney Forcucci, PA-C  aspirin 81 MG tablet Take 81 mg by mouth daily.   Yes Historical Provider, MD  budesonide-formoterol (SYMBICORT) 160-4.5 MCG/ACT inhaler 2 puffs twice a day, wash mouth afterwards Patient taking differently: Inhale 2 puffs into the lungs 2 (two)  times daily.  08/11/16  Yes Doreatha Lew, MD  Cholecalciferol (VITAMIN D PO) Take 2,000 Units by mouth daily.    Yes Historical Provider, MD  diltiazem (CARDIZEM CD) 300 MG 24 hr capsule Take 1 capsule (300 mg total) by mouth daily. 09/13/16  Yes Unk Pinto, MD  DULoxetine (CYMBALTA) 60 MG capsule TAKE 1 CAPSULE BY MOUTH DAILY 08/24/16  Yes Unk Pinto, MD  famotidine (PEPCID) 20 MG tablet TAKE 1 TABLET (20 MG TOTAL) BY MOUTH 2 (TWO) TIMES DAILY. FOR ACID REFLUX 09/19/16  Yes Unk Pinto, MD  ferrous sulfate 325  (65 FE) MG tablet Take 325 mg by mouth 2 (two) times daily with a meal.   Yes Historical Provider, MD  fluticasone (FLONASE) 50 MCG/ACT nasal spray Place 2 sprays into both nostrils daily. Patient taking differently: Place 2 sprays into both nostrils daily as needed for allergies.  10/29/16  Yes Courtney Forcucci, PA-C  furosemide (LASIX) 80 MG tablet Take 1 tablet (80 mg total) by mouth daily. Patient taking differently: Take 40-80 mg by mouth daily as needed for fluid.  09/17/16  Yes Unk Pinto, MD  guaiFENesin (MUCINEX) 600 MG 12 hr tablet Take 600 mg by mouth daily.   Yes Historical Provider, MD  levothyroxine (SYNTHROID, LEVOTHROID) 175 MCG tablet TAKE 1 TABLET (175 MCG TOTAL) BY MOUTH DAILY BEFORE BREAKFAST. 09/18/16  Yes [name], PA-C  losartan (COZAAR) 50 MG tablet TAKE 1 TABLET BY MOUTH DAILY 09/29/16  Yes [name], PA-C  Magnesium Chloride (MAGNESIUM DR PO) Take 1 capsule by mouth daily.    Yes Historical Provider, MD  metFORMIN (GLUCOPHAGE-XR) 500 MG 24 hr tablet TAKE 1 TABLET (500 MG TOTAL) BY MOUTH 4 (FOUR) TIMES DAILY - AFTER MEALS AND AT BEDTIME. 10/14/16  Yes Unk Pinto, MD  metoprolol tartrate (LOPRESSOR) 25 MG tablet Take 1 tablet (25 mg total) by mouth 2 (two) times daily. 09/13/16  Yes Unk Pinto, MD  OXYGEN-HELIUM IN Inhale 5 L into the lungs.    Yes Historical Provider, MD  pravastatin (PRAVACHOL) 40 MG tablet TAKE 1 TABLET (40 MG TOTAL) BY MOUTH DAILY. 03/07/16  Yes Unk Pinto, MD  predniSONE (DELTASONE) 20 MG tablet 3 tabs po daily x 3 days, then 2 tabs x 3 days, then 1.5 tabs x 3 days, then 1 tab x 3 days, then 0.5 tabs x 3 days 10/29/16  Yes Courtney Forcucci, PA-C  warfarin (COUMADIN) 5 MG tablet TAKE 1 TABLET (5 MG TOTAL) BY MOUTH ONE TIME ONLY AT 6 PM. Patient taking differently: Take 5 mg by mouth See admin instructions. Take 2.5 mg by mouth daily on Sunday and Wednesday. Take 5 mg by mouth daily on all other days 10/22/16  Yes Unk Pinto, MD    loratadine-pseudoephedrine (CLARITIN-D 24-HOUR) 10-240 MG per 24 hr tablet Take 1 tablet by mouth as needed for allergies.     Historical Provider, MD  metolazone (ZAROXOLYN) 5 MG tablet TAKE 1 TABLET (5 MG TOTAL) BY MOUTH DAILY. Patient not taking: Reported on 11/11/2016 10/14/16   [name], PA-C    Physical Exam: Vitals:   11/11/16 2200 11/11/16 2215 11/11/16 2230 11/11/16 2315  BP: 108/98 121/81 (!) 124/112 143/84  Pulse: 74 (!) 133 (!) 132 116  Resp: 20 (!) 28 18   Temp:      TempSrc:      SpO2: 90% 91% 92% 93%  Weight:      Height:          Constitutional: Obese not  in distress. Vitals:   11/11/16 2200 11/11/16 2215 11/11/16 2230 11/11/16 2315  BP: 108/98 121/81 (!) 124/112 143/84  Pulse: 74 (!) 133 (!) 132 116  Resp: 20 (!) 28 18   Temp:      TempSrc:      SpO2: 90% 91% 92% 93%  Weight:      Height:       Eyes: Anicteric no pallor. ENMT: No discharge from the ears eyes nose or mouth. Neck: No JVD appreciated no mass felt. Respiratory: Bilateral air entry present no rhonchi mild crepitations. Cardiovascular: S1 and S2 heard no murmurs appreciated. Abdomen: Soft nontender bowel sounds present. Musculoskeletal: Mild edema both lower extremities. Skin: No rash. Skin appears warm. Neurologic: Alert awake oriented to time place and person. Moves all extremities. Psychiatric: Appears normal. Normal affect.   Labs on Admission: I have personally reviewed following labs and imaging studies  CBC:  Recent Labs Lab 11/11/16 1853  WBC 9.0  NEUTROABS 6.7  HGB 10.3*  HCT 38.7  MCV 83.0  PLT [id number]   Basic Metabolic Panel:  Recent Labs Lab 11/11/16 1853  NA 141  K 4.5  CL 100*  CO2 33*  GLUCOSE 119*  BUN 15  CREATININE 0.70  CALCIUM 8.9   GFR: Estimated Creatinine Clearance: 85.3 mL/min (by C-G formula based on SCr of 0.7 mg/dL). Liver Function Tests:  Recent Labs Lab 11/11/16 1853  AST 18  ALT 15  ALKPHOS 73  BILITOT 0.6  PROT 6.4*   ALBUMIN 3.4*   No results for input(s): LIPASE, AMYLASE in the last 168 hours. No results for input(s): AMMONIA in the last 168 hours. Coagulation Profile:  Recent Labs Lab 11/11/16 1853  INR 1.25   Cardiac Enzymes: No results for input(s): CKTOTAL, CKMB, CKMBINDEX, TROPONINI in the last 168 hours. BNP (last 3 results) No results for input(s): PROBNP in the last 8760 hours. HbA1C: No results for input(s): HGBA1C in the last 72 hours. CBG: No results for input(s): GLUCAP in the last 168 hours. Lipid Profile: No results for input(s): CHOL, HDL, LDLCALC, TRIG, CHOLHDL, LDLDIRECT in the last 72 hours. Thyroid Function Tests: No results for input(s): TSH, T4TOTAL, FREET4, T3FREE, THYROIDAB in the last 72 hours. Anemia Panel: No results for input(s): VITAMINB12, FOLATE, FERRITIN, TIBC, IRON, RETICCTPCT in the last 72 hours. Urine analysis:    Component Value Date/Time   COLORURINE YELLOW 07/27/2015 1612   APPEARANCEUR CLEAR 07/27/2015 1612   LABSPEC 1.017 07/27/2015 1612   PHURINE 6.0 07/27/2015 1612   GLUCOSEU NEGATIVE 07/27/2015 1612   HGBUR 2+ (A) 07/27/2015 1612   BILIRUBINUR NEGATIVE 07/27/2015 1612   KETONESUR NEGATIVE 07/27/2015 1612   PROTEINUR NEGATIVE 07/27/2015 1612   UROBILINOGEN 1.0 10/23/2009 1145   NITRITE NEGATIVE 07/27/2015 1612   LEUKOCYTESUR NEGATIVE 07/27/2015 1612   Sepsis Labs: @LABRCNTIP (procalcitonin:4,lacticidven:4) )No results found for this or any previous visit (from the past 240 hour(s)).   Radiological Exams on Admission: Dg Chest Portable 1 View  Result Date: 11/11/2016 CLINICAL DATA:  Abnormal EKG. EXAM: PORTABLE CHEST 1 VIEW COMPARISON:  07/31/2016 FINDINGS: 1857 hours. Low volume film. The cardio pericardial silhouette is enlarged. Pulmonary vascular congestion noted with probable interstitial pulmonary edema. There is bibasilar collapse/consolidation with small bilateral pleural effusions. The visualized bony structures of the thorax are  intact. Telemetry leads overlie the chest. IMPRESSION: Cardiomegaly with interstitial edema, bibasilar collapse/ consolidation, and small effusions. Electronically Signed   By: Misty Stanley M.D.   On: 11/11/2016 19:09    EKG:  Independently reviewed. A. fib with RVR.  Assessment/Plan Principal Problem:   Atrial fibrillation with rapid ventricular response (HCC) Active Problems:   Essential hypertension   COPD (chronic obstructive pulmonary disease) (HCC)   Acute on chronic diastolic CHF (congestive heart failure) (HCC)   Acute on chronic respiratory failure with hypoxia (HCC)   Dilated cardiomyopathy (HCC)   CHF (congestive heart failure) ([place])    1. A. fib with RVR - not sure if patient had been compliant with medications. I have ordered one more dose of Cardizem 10 mg IV bolus following which patient will be getting her night dose of metoprolol. Patient is also on long-acting Cardizem in the morning. If heart rate does not improve with IV bolus of Cardizem will start patient on Cardizem infusion. Check TSH cardiac markers. Chads 2 vasc score more than 2 patient is on Coumadin. Patient had decreased her dose last week because patient was taking antibiotics. 2. Acute on chronic diastolic CHF last EF measured in November 2017 was 65-70% - patient has gained at least 15 pounds over the last 1 week. I have ordered Lasix 80 mg IV 1 dose and daily. Follow daily weights intake output and metabolic panel. 3. COPD end stage on home oxygen and nebulizer which will be continued. Patient is on Diamox for chronic carbon dioxide retention. 4. Diabetes mellitus type 2 - on sliding scale coverage while inpatient. 5. Chronic anemia - follows CBC. 6. Hypothyroidism on Synthroid - check TSH. 7. Sleep apnea with doubtful compliance - on CPAP.   DVT prophylaxis: Coumadin. Code Status: Full code.  Family Communication: Patient's son.  Disposition Plan: Home.  Consults called: None.  Admission status:  Inpatient.    Rise Patience MD [facility] Pager [phone].  If 7PM-7AM, please contact night-coverage [email or web address] Password [facility]  11/11/2016, 11:31 PM

## 2016-11-11 NOTE — ED Triage Notes (Signed)
Pt reports being sent by her PCP tfor abnormal EKG. Pt states that she began having a cough 2 weeks ago. Pt states that she has had cough and congestions. Pt was given zpack and steroids. Pt reports worsening since yesterday. Pt reports generalized weakness and not sleeping well.

## 2016-11-11 NOTE — Progress Notes (Signed)
[facility] Unk Pinto, M.D.    Uvaldo Bristle. Silverio Lay, P.A.-C      Starlyn Skeans, P.A.-C  [facility]                [address] [place], [place] [id number] Telephone [phone] Telefax [phone]  Annual Screening/Preventative Visit & Comprehensive Evaluation &  Examination     This very nice 71 y.o. MWF presents for a Screening/Preventative Visit & comprehensive evaluation and management of multiple medical co-morbidities.  Patient has been followed for HTN, pAfib,  T2_DM, End Stage COPD, Hyperlipidemia, Hypothyroidism and Vitamin D Deficiency.      HTN predates since 42.  In Nov 2017, she was newly dx'd with Afib and started on Coumadin.      As patient presented for her annual exam today and at the termination of the visit, her EKG revealed an irregular SV tachyarrhythmia (140-150+) with frequent PVC's vs aberrancy and patient was advised evaluation at the ER. Today's BP is elevated at 132/90.      Patient also is O2 Dependent severe end stage COPD and also has hx/o OSA with poor CPAP compliance.  She has tendency toward CO2 retention and is on Diamox and has been advised to target O2 sats to 90-92% range . She frequently increases her supplemental O2 to 5-6 L/M. Her GERD is controlled with medications.       Patient's hyperlipidemia is controlled with diet and medications. Patient denies myalgias or other medication SE's. Last lipids were at goal with sl elevated Trig's:  Lab Results  Component Value Date   CHOL 180 06/06/2016   HDL 75 06/06/2016   LDLCALC 68 06/06/2016   TRIG 187 (H) 06/06/2016   CHOLHDL 2.4 06/06/2016      Patient has Morbid Obesity (BMI 44+) and consequent T2_NIDDM (2011)  and hx/o Stage 3 CKD and patient denies reactive hypoglycemic symptoms, visual blurring, diabetic polys, or paresthesias. Last A1c was at goal:  Lab Results  Component Value Date   HGBA1C 5.2  06/06/2016      Patient also has compensated Hypothyroidism. Finally, patient has history of Vitamin D Deficiency ("9" in 2009) and last Vitamin D was still relatively low (goal 70-100): Lab Results  Component Value Date   VD25OH 45 02/29/2016   Current Outpatient Prescriptions on File Prior to Visit  Medication Sig  . TYLENOL #3  Take 1 tab  hrs as needed  . acetaZOLAMIDE  125 MG  Take 1 tablet 3 x/ day  . albuterol  HFA inhaler 1-2 puffs every 6  hrs as needed   . aspirin 81 MG tablet Take 81 mg by mouth daily.  Marland Kitchen azithromycin (ZITHROMAX Z-PAK) 250 MG tablet 2 po day one, then 1 daily x 4 days  . SYMBICORT 160-4.5 2 puffs twice a day, wash mouth afterwards  . VITAMIN D Take 2,000 Units by mouth daily.   Marland Kitchen diltiazem CD 300 MG 24 hr  Take 1 capsule (300 mg total) by mouth daily.  . DULoxetine  60 MG capsule TAKE 1 CAPSULE BY MOUTH DAILY  . famotidine (PEPCID) 20 MG tablet TAKE 1 TAB 2  X DAILY. FOR ACID REFLUX  . fluticasone (FLONASE) 50 MCG/ACT nasal spray Place 2 sprays into both nostrils daily.  . furosemide (LASIX) 80 MG tablet Take 1 tablet (80 mg total) by mouth daily.  Marland Kitchen guaiFENesin  600 MG 12 hr tab Take 600 mg by mouth daily.  Marland Kitchen levothyroxine 175 MCG tablet TAKE 1 TAB DAILY  . CLARITIN-D 24-HR 10-240  Take 1 tablet by mouth as needed for allergies.   Marland Kitchen losartan (COZAAR) 50 MG tablet TAKE 1 TABLET BY MOUTH DAILY  . Magnesium  Take 1 capsule by mouth daily.   . metFORMIN-XR 500 MG  TAKE 1 TAB 4 x/ DAILY   . metolazone  5 MG tablet TAKE 1 TABLET (5 MG TOTAL) BY MOUTH DAILY.  . metoprolol tart 25 MG tablet Take 1 tab 2 x/daily.  . OXYGEN-HELIUM IN Inhale 5 L into the lungs.   . pravastatin40 MG tablet TAKE 1 TAB DAILY.  Marland Kitchen warfarin  5 MG tablet TAKE 1 TAB ONE TIME   Allergies  Allergen Reactions  . Inderal [Propranolol] Hair loss  . Ace Inhibitors Cough   Past Medical History:  Diagnosis Date  . Arthritis   . COPD (chronic obstructive pulmonary disease) ([place])   .  Depression   . Diabetes mellitus type 2 in obese ([place])   . GERD (gastroesophageal reflux disease)   . Hyperlipidemia   . Hypertension   . Morbid obesity ([place])   . OAB (overactive bladder)   . PSVT (paroxysmal supraventricular tachycardia) ([place])   . Thyroid disease   . Vitamin D deficiency    Health Maintenance  Topic Date Due  . OPHTHALMOLOGY EXAM  02/10/2015  . MAMMOGRAM  10/07/2015  . FOOT EXAM  06/14/2016  . Hepatitis C Screening  09/16/2018 (Originally [birth year])  . HEMOGLOBIN A1C  12/04/2016  . TETANUS/TDAP  08/16/2018  . COLONOSCOPY  08/16/2020  . INFLUENZA VACCINE  Completed  . DEXA SCAN  Completed  . PNA vac Low Risk Adult  Completed   Immunization History  Administered Date(s) Administered  . Influenza, High Dose Seasonal PF 08/16/2014, 07/27/2015, 06/06/2016  . Pneumococcal Conjugate-13 07/27/2015  . Pneumococcal-Unspecified 05/16/2011  . Tdap 08/16/2008   Past Surgical History:  Procedure Laterality Date  . CARPAL TUNNEL RELEASE     L 1992 R 1984  . EYE SURGERY Bilateral    cataract  . NM MYOVIEW LTD  01/2011   Dobutamine Myoview: Negative perfusion scan for ischemia / infarct (poor image capture); Pt developed SVT with Dobutamine that reproduced her CP as it resolved with restoration of NSR.  Marland Kitchen PUBOVAGINAL SLING    . TONSILLECTOMY AND ADENOIDECTOMY    . TRANSTHORACIC ECHOCARDIOGRAM  01/2011   Hyperdynamic LV with EF 65-70%, Gr 1 DD, Mild Ao Stenosis - mean gradient ~19 mmHg   Family History  Problem Relation Age of Onset  . Alcohol abuse Father   . Heart disease Father    Social History  Substance Use Topics  . Smoking status: Former Smoker    Packs/day: 0.50    Years: 44.00    Types: Cigarettes    Quit date: 08/15/2016  . Smokeless tobacco: Never Used     Comment: smoke about 5-6 cigarette daily  . Alcohol use No    ROS Constitutional: Denies fever, chills, weight loss/gain, headaches, insomnia,  night sweats, and change in appetite. Does c/o  fatigue. Eyes: Denies redness, blurred vision, diplopia, discharge, itchy, watery eyes.  ENT: Denies discharge, congestion, post nasal drip, epistaxis, sore throat, earache, hearing loss, dental pain, Tinnitus, Vertigo, Sinus pain, snoring.  Cardio: Denies chest pain, palpitations, irregular heartbeat, syncope, dyspnea, diaphoresis, orthopnea, PND, claudication, edema Respiratory: denies cough, dyspnea, DOE, pleurisy, hoarseness, laryngitis, wheezing.  Gastrointestinal: Denies dysphagia,  heartburn, reflux, water brash, pain, cramps, nausea, vomiting, bloating, diarrhea, constipation, hematemesis, melena, hematochezia, jaundice, hemorrhoids Genitourinary: Denies dysuria, frequency, urgency, nocturia, hesitancy, discharge, hematuria, flank pain Breast: Breast lumps, nipple discharge, bleeding.  Musculoskeletal: Denies arthralgia, myalgia, stiffness, Jt. Swelling, pain, limp, and strain/sprain. Denies falls. Skin: Denies puritis, rash, hives, warts, acne, eczema, changing in skin lesion Neuro: No weakness, tremor, incoordination, spasms, paresthesia, pain Psychiatric: Denies confusion, memory loss, sensory loss. Denies Depression. Endocrine: Denies change in weight, skin, hair change, nocturia, and paresthesia, diabetic polys, visual blurring, hyper / hypo glycemic episodes.  Heme/Lymph: No excessive bleeding, bruising, enlarged lymph nodes.  Physical Exam  BP 132/90   P 1130-140+   T 97.5 F   R 16   Ht 5\' 5"     Wt 267 lb 9.6 oz (  BMI 44.53   General Appearance: Over nourished, sallow, Unkempt and in no apparent distress. Nasal prongs in place.   Eyes: PERRLA, EOMs, conjunctiva no swelling or erythema, normal fundi and vessels. Sinuses: No frontal/maxillary tenderness ENT/Mouth: EACs patent / TMs  nl. Nares clear without erythema, swelling, mucoid exudates. Oral hygiene is good. No erythema, swelling, or exudate. Tongue normal, non-obstructing. Tonsils not swollen or erythematous. Hearing  normal.  Neck: Supple, thyroid normal. No bruits, nodes or JVD. Respiratory: Respiratory effort normal.  BS decreased and obscured by chest wall thickness.  Cardio: Heart sounds are soft and rapid with irregular rate and rhythm and no murmurs are heard. Pedal pulses are obscured by chronic brawny ankle/pedal edema. No aortic or femoral bruits. Chest:  Breath sounds very distant due to chest wall thickness. Breasts: Pendulous without lumps, nipple discharge or retractions noted.  Abdomen: Soft , rotund with bowel sounds obscured. Nontender, no guarding, rebound, hernias, masses, or organomegaly.  Lymphatics: Non tender without lymphadenopathy.  Musculoskeletal: Patient in wheel chair. Generalized decrease in muscle power, tone and bulk. . Skin: Warm and dry without rashes, lesions, cyanosis, clubbing or  ecchymosis.  Neuro: Cranial nerves intact, reflexes equal bilaterally. Normal muscle tone, no cerebellar symptoms. Sensation intact.  Pysch: Alert and oriented X 3, normal affect, Insight and Judgment appropriate.   Assessment and Plan  1. Annual Preventative Screening Examination   2. Essential hypertension  - EKG 12-Lead  3. Mixed hyperlipidemia  4. Type 2 diabetes mellitus with stage 3 chronic kidney disease, without long-term current use of insulin ([place])  5. Vitamin D deficiency   6. Paroxysmal atrial fibrillation ([facility])   - Recommended evaluation in ER - EKG 12-Lead  7. Hypothyroidism   8. Chronic obstructive pulmonary disease ([place])   9. Gastroesophageal reflux disease   10. Screening for ischemic heart disease  - EKG 12-Lead  11. Long term current use of anticoagulant therapy   12. Medication management       Continue prudent diet as discussed, weight control, BP monitoring, regular exercise, and medications. Discussed med's effects and SE's. Screening labs and tests as requested with regular follow-up as recommended. Over 40 minutes of exam, counseling, chart  review and high complex critical decision making was performed.

## 2016-11-11 NOTE — ED Provider Notes (Signed)
[facility] DEPT Provider Note   CSN: [id number] Arrival date & time: 11/11/16  1754     History   Chief Complaint Chief Complaint  Patient presents with  . Abnormal ECG    HPI Jillian Wood is a 71 y.o. female. Patient has complicated medical history including COPD on 5 L of home oxygen, hypertension, hyperlipidemia, morbid obesity, paroxysmal atrial fibrillation on chronic anticoagulation with warfarin who presents from her PCP today due to abnormal EKG. Patient was having a routine health exam today when she was noted to be tachycardic in the 130s. She was sent here for further evaluation. Patient reports that she had had cough and congestion for 2 weeks and was recently treated for a COPD exacerbation. She is still feeling some shortness of breath. She denies any chest pain. She reports generalized weakness and just not feeling well. She denies any fever or chills. She reports that she is compliant with her medications, however the family has some concerns that this may not be the case.  HPI  Past Medical History:  Diagnosis Date  . Arthritis   . COPD (chronic obstructive pulmonary disease) ([place])   . Depression   . Diabetes mellitus type 2 in obese ([place])   . GERD (gastroesophageal reflux disease)   . Hyperlipidemia   . Hypertension   . Morbid obesity ([place])   . OAB (overactive bladder)   . PSVT (paroxysmal supraventricular tachycardia) ([place])   . Thyroid disease   . Vitamin D deficiency     Patient Active Problem List   Diagnosis Date Noted  . Atrial fibrillation with rapid ventricular response ([place]) 11/11/2016  . CHF (congestive heart failure) ([place]) 11/11/2016  . Acute kidney injury ([place]) 08/30/2016  . Current use of long term anticoagulation 08/30/2016  . Dilated cardiomyopathy ([place])   . Pulmonary hypertension   . Diabetes mellitus type 2 in obese ([place])   . Acute on chronic respiratory failure with hypoxia ([place]) 07/31/2016  . COPD with acute exacerbation ([place])  07/31/2016  . Atrial fibrillation with RVR ([place]) 07/31/2016  . GERD (gastroesophageal reflux disease) 07/31/2015  . Tobacco abuse 10/18/2014  . Acute on chronic diastolic CHF (congestive heart failure) ([place]) 10/18/2014  . COPD (chronic obstructive pulmonary disease) ([place]) 09/21/2014  . Paroxysmal SVT (supraventricular tachycardia) ([place]) 08/04/2014  . Hypothyroidism 02/22/2014  . Medication management 11/06/2013  . Essential hypertension   . Hyperlipidemia   . Morbid obesity ([place])   . Vitamin D deficiency   . 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 IRON DEFICIENCY 07/11/2010  . Personal history of colonic polyps 07/11/2010    Past Surgical History:  Procedure Laterality Date  . CARPAL TUNNEL RELEASE     L 1992 R 1984  . EYE SURGERY Bilateral    cataract  . NM MYOVIEW LTD  01/2011   Dobutamine Myoview: Negative perfusion scan for ischemia / infarct (poor image capture); Pt developed SVT with Dobutamine that reproduced her CP as it resolved with restoration of NSR.  Marland Kitchen PUBOVAGINAL SLING    . TONSILLECTOMY AND ADENOIDECTOMY    . TRANSTHORACIC ECHOCARDIOGRAM  01/2011   Hyperdynamic LV with EF 65-70%, Gr 1 DD, Mild Ao Stenosis - mean gradient ~19 mmHg    OB History    No data available       Home Medications    Prior to Admission medications   Medication Sig Start Date End Date Taking? Authorizing Provider  acetaminophen-codeine (TYLENOL #3) 300-30 MG per tablet Take 1  tablet by mouth every 8 (eight) hours as needed for moderate pain or severe pain. 06/07/15  Yes [name], PA-C  acetaZOLAMIDE (DIAMOX) 125 MG tablet Take 1 tablet 3 x/ day Patient taking differently: Take 125 mg by mouth 3 (three) times daily.  08/13/16 02/10/17 Yes Unk Pinto, MD  albuterol (PROVENTIL HFA;VENTOLIN HFA) 108 (90 Base) MCG/ACT inhaler Inhale 1-2 puffs into the lungs every 6 (six) hours as needed for wheezing or shortness of breath. 06/06/16  Yes Courtney Forcucci, PA-C  aspirin 81 MG  tablet Take 81 mg by mouth daily.   Yes Historical Provider, MD  budesonide-formoterol (SYMBICORT) 160-4.5 MCG/ACT inhaler 2 puffs twice a day, wash mouth afterwards Patient taking differently: Inhale 2 puffs into the lungs 2 (two) times daily.  08/11/16  Yes Doreatha Lew, MD  Cholecalciferol (VITAMIN D PO) Take 2,000 Units by mouth daily.    Yes Historical Provider, MD  diltiazem (CARDIZEM CD) 300 MG 24 hr capsule Take 1 capsule (300 mg total) by mouth daily. 09/13/16  Yes Unk Pinto, MD  DULoxetine (CYMBALTA) 60 MG capsule TAKE 1 CAPSULE BY MOUTH DAILY 08/24/16  Yes Unk Pinto, MD  famotidine (PEPCID) 20 MG tablet TAKE 1 TABLET (20 MG TOTAL) BY MOUTH 2 (TWO) TIMES DAILY. FOR ACID REFLUX 09/19/16  Yes Unk Pinto, MD  ferrous sulfate 325 (65 FE) MG tablet Take 325 mg by mouth 2 (two) times daily with a meal.   Yes Historical Provider, MD  fluticasone (FLONASE) 50 MCG/ACT nasal spray Place 2 sprays into both nostrils daily. Patient taking differently: Place 2 sprays into both nostrils daily as needed for allergies.  10/29/16  Yes Courtney Forcucci, PA-C  furosemide (LASIX) 80 MG tablet Take 1 tablet (80 mg total) by mouth daily. Patient taking differently: Take 40-80 mg by mouth daily as needed for fluid.  09/17/16  Yes Unk Pinto, MD  guaiFENesin (MUCINEX) 600 MG 12 hr tablet Take 600 mg by mouth daily.   Yes Historical Provider, MD  levothyroxine (SYNTHROID, LEVOTHROID) 175 MCG tablet TAKE 1 TABLET (175 MCG TOTAL) BY MOUTH DAILY BEFORE BREAKFAST. 09/18/16  Yes [name], PA-C  losartan (COZAAR) 50 MG tablet TAKE 1 TABLET BY MOUTH DAILY 09/29/16  Yes [name], PA-C  Magnesium Chloride (MAGNESIUM DR PO) Take 1 capsule by mouth daily.    Yes Historical Provider, MD  metFORMIN (GLUCOPHAGE-XR) 500 MG 24 hr tablet TAKE 1 TABLET (500 MG TOTAL) BY MOUTH 4 (FOUR) TIMES DAILY - AFTER MEALS AND AT BEDTIME. 10/14/16  Yes Unk Pinto, MD  metoprolol tartrate (LOPRESSOR) 25 MG  tablet Take 1 tablet (25 mg total) by mouth 2 (two) times daily. 09/13/16  Yes Unk Pinto, MD  OXYGEN-HELIUM IN Inhale 5 L into the lungs.    Yes Historical Provider, MD  pravastatin (PRAVACHOL) 40 MG tablet TAKE 1 TABLET (40 MG TOTAL) BY MOUTH DAILY. 03/07/16  Yes Unk Pinto, MD  predniSONE (DELTASONE) 20 MG tablet 3 tabs po daily x 3 days, then 2 tabs x 3 days, then 1.5 tabs x 3 days, then 1 tab x 3 days, then 0.5 tabs x 3 days 10/29/16  Yes Courtney Forcucci, PA-C  warfarin (COUMADIN) 5 MG tablet TAKE 1 TABLET (5 MG TOTAL) BY MOUTH ONE TIME ONLY AT 6 PM. Patient taking differently: Take 5 mg by mouth See admin instructions. Take 2.5 mg by mouth daily on Sunday and Wednesday. Take 5 mg by mouth daily on all other days 10/22/16  Yes Unk Pinto, MD  loratadine-pseudoephedrine (  CLARITIN-D 24-HOUR) 10-240 MG per 24 hr tablet Take 1 tablet by mouth as needed for allergies.     Historical Provider, MD  metolazone (ZAROXOLYN) 5 MG tablet TAKE 1 TABLET (5 MG TOTAL) BY MOUTH DAILY. Patient not taking: Reported on 11/11/2016 10/14/16   [name], PA-C    Family History Family History  Problem Relation Age of Onset  . Alcohol abuse Father   . Heart disease Father     Social History Social History  Substance Use Topics  . Smoking status: Former Smoker    Packs/day: 0.50    Years: 44.00    Types: Cigarettes    Quit date: 08/15/2016  . Smokeless tobacco: Never Used     Comment: smoke about 5-6 cigarette daily  . Alcohol use No     Allergies   Inderal [propranolol] and Ace inhibitors   Review of Systems Review of Systems  Constitutional: Negative for chills and fever.  HENT: Positive for congestion. Negative for ear pain and sore throat.   Eyes: Negative for pain and visual disturbance.  Respiratory: Positive for cough and shortness of breath.   Cardiovascular: Negative for chest pain and palpitations.  Gastrointestinal: Negative for abdominal pain and vomiting.    Genitourinary: Negative for dysuria and hematuria.  Musculoskeletal: Negative for arthralgias and back pain.  Skin: Negative for color change and rash.  Neurological: Negative for seizures and syncope.  All other systems reviewed and are negative.    Physical Exam Updated Vital Signs BP 126/100   Pulse 108   Temp 98.7 F (37.1 C) (Oral)   Resp 20   Ht 5\' 5"  (1.651 m)   Wt 121.1 kg   SpO2 91%   BMI 44.43 kg/m   Physical Exam  Constitutional: She appears well-developed and well-nourished. No distress.  HENT:  Head: Normocephalic and atraumatic.  Eyes: Conjunctivae are normal.  Neck: Neck supple.  Cardiovascular:  No murmur heard. Tachycardic, irregular rhythm  Pulmonary/Chest: Breath sounds normal. No respiratory distress. She has no wheezes. She has no rales.  Increased work of breathing  Abdominal: Soft. There is no tenderness.  Musculoskeletal: She exhibits no edema, tenderness or deformity.  Neurological: She is alert.  Skin: Skin is warm and dry.  Psychiatric: She has a normal mood and affect.  Nursing note and vitals reviewed.    ED Treatments / Results  Labs (all labs ordered are listed, but only abnormal results are displayed) Labs Reviewed  CBC WITH DIFFERENTIAL/PLATELET - Abnormal; Notable for the following:       Result Value   Hemoglobin 10.3 (*)    MCH 22.1 (*)    MCHC 26.6 (*)    RDW 18.6 (*)    All other components within normal limits  COMPREHENSIVE METABOLIC PANEL - Abnormal; Notable for the following:    Chloride 100 (*)    CO2 33 (*)    Glucose, Bld 119 (*)    Total Protein 6.4 (*)    Albumin 3.4 (*)    All other components within normal limits  PROTIME-INR - Abnormal; Notable for the following:    Prothrombin Time 15.8 (*)    All other components within normal limits  BRAIN NATRIURETIC PEPTIDE - Abnormal; Notable for the following:    B Natriuretic Peptide 352.4 (*)    All other components within normal limits  I-STAT CG4 LACTIC  ACID, ED - Abnormal; Notable for the following:    Lactic Acid, Venous 1.99 (*)    All other components within  normal limits  MAGNESIUM  TSH  TROPONIN I  TROPONIN I  TROPONIN I  CBC WITH DIFFERENTIAL/PLATELET  COMPREHENSIVE METABOLIC PANEL  PROTIME-INR  I-STAT TROPOININ, ED  I-STAT CG4 LACTIC ACID, ED    EKG  EKG Interpretation  Date/Time:  Monday November 11 2016 18:41:26 EST Ventricular Rate:  135 PR Interval:    QRS Duration: 144 QT Interval:  349 QTC Calculation: 524 R Axis:   145 Text Interpretation:  Atrial flutter with varied AV block, RBBB and LPFB ST depr, consider ischemia, inferior leads Baseline wander in lead(s) I II III aVR aVF No significant change since last tracing Confirmed by RAY MD, Andee Poles [phone]) on 11/11/2016 7:30:40 PM       Radiology Dg Chest Portable 1 View  Result Date: 11/11/2016 CLINICAL DATA:  Abnormal EKG. EXAM: PORTABLE CHEST 1 VIEW COMPARISON:  07/31/2016 FINDINGS: 1857 hours. Low volume film. The cardio pericardial silhouette is enlarged. Pulmonary vascular congestion noted with probable interstitial pulmonary edema. There is bibasilar collapse/consolidation with small bilateral pleural effusions. The visualized bony structures of the thorax are intact. Telemetry leads overlie the chest. IMPRESSION: Cardiomegaly with interstitial edema, bibasilar collapse/ consolidation, and small effusions. Electronically Signed   By: Misty Stanley M.D.   On: 11/11/2016 19:09    Procedures Procedures (including critical care time)  Medications Ordered in ED Medications  furosemide (LASIX) injection 80 mg (80 mg Intravenous Given 11/11/16 2346)  levalbuterol (XOPENEX) nebulizer solution 0.63 mg (0.63 mg Nebulization Given 11/12/16 0016)  levalbuterol (XOPENEX) nebulizer solution 0.63 mg (not administered)  budesonide (PULMICORT) nebulizer solution 0.25 mg (0.25 mg Nebulization Given 11/11/16 2339)  ferrous sulfate tablet 325 mg (not administered)    fluticasone (FLONASE) 50 MCG/ACT nasal spray 2 spray (not administered)  losartan (COZAAR) tablet 50 mg (not administered)  famotidine (PEPCID) tablet 20 mg (not administered)  levothyroxine (SYNTHROID, LEVOTHROID) tablet 175 mcg (not administered)  diltiazem (CARDIZEM CD) 24 hr capsule 300 mg (not administered)  metoprolol tartrate (LOPRESSOR) tablet 25 mg (not administered)  DULoxetine (CYMBALTA) DR capsule 60 mg (not administered)  acetaZOLAMIDE (DIAMOX) tablet 125 mg (not administered)  pravastatin (PRAVACHOL) tablet 40 mg (not administered)  guaiFENesin (MUCINEX) 12 hr tablet 600 mg (not administered)  aspirin chewable tablet 81 mg (not administered)  acetaminophen (TYLENOL) tablet 650 mg (not administered)    Or  acetaminophen (TYLENOL) suppository 650 mg (not administered)  ondansetron (ZOFRAN) tablet 4 mg (not administered)    Or  ondansetron (ZOFRAN) injection 4 mg (not administered)  insulin aspart (novoLOG) injection 0-9 Units (not administered)  potassium chloride SA (K-DUR,KLOR-CON) CR tablet 20 mEq (not administered)  warfarin (COUMADIN) tablet 6 mg (not administered)  Warfarin - Pharmacist Dosing Inpatient (not administered)  diltiazem (CARDIZEM) injection 5 mg (5 mg Intravenous Given 11/11/16 2118)  diltiazem (CARDIZEM) injection 10 mg (10 mg Intravenous Given 11/11/16 2341)     Initial Impression / Assessment and Plan / ED Course  I have reviewed the triage vital signs and the nursing notes.  Pertinent labs & imaging results that were available during my care of the patient were reviewed by me and considered in my medical decision making (see chart for details).    Pt is a 71 year old female with history as above who presents withtachycardia from her PCP. She was at PCP today for routine follow-up and found to be in atrial fibrillation with RVR. Looking back at her recent cardiology notes, it appears the patient is normally in sinus rhythm. She is on Coumadin due to  her paroxysmal A. fib. However, today her INR is subtherapeutic. Patient reports she may have missed some doses due to her recent antibiotics and steroids. She reports recent cough and congestion and was recently treated for a COPD exacerbation. It appears that her baseline health status is poor and she has very little respiratory reserve. However, here she easily desats with minimal exertion. Chest x-ray showed signs of volume overload. Initial heart rate was in the 120s, however this did improve to the 110 range. She was given 5 mg of diltiazem with minimal improvement in her heart rate. Her blood pressures remained 0000000 to [id number] systolic. Given her volume overload and worsening hypoxia with exertion, pt will be admitted for further workup and treatment. Pt has had no chest pain and her initial troponin was negative.   Final Clinical Impressions(s) / ED Diagnoses   Final diagnoses:  Atrial fibrillation, unspecified type [facility])    New Prescriptions New Prescriptions   No medications on file     Clifton James, MD 11/12/16 [id number]    Pattricia Boss, MD 11/14/16 2358

## 2016-11-11 NOTE — ED Notes (Signed)
Pt provided with [place] sandwich per Dr. Lara Mulch request

## 2016-11-11 NOTE — Progress Notes (Signed)
ANTICOAGULATION CONSULT NOTE - Initial Consult  Pharmacy Consult for Coumadin Indication: atrial fibrillation  Allergies  Allergen Reactions  . Inderal [Propranolol] Other (See Comments)    Hair loss  . Ace Inhibitors Cough    Patient Measurements: Height: 5\' 5"  (165.1 cm) Weight: 267 lb (121.1 kg) IBW/kg (Calculated) : 57  Vital Signs: Temp: 98.7 F (37.1 C) (02/26 1829) Temp Source: Oral (02/26 1829) BP: 140/90 (02/26 2330) Pulse Rate: 107 (02/26 2330)  Labs:  Recent Labs  11/11/16 1853  HGB 10.3*  HCT 38.7  PLT 323  LABPROT 15.8*  INR 1.25  CREATININE 0.70    Estimated Creatinine Clearance: 85.3 mL/min (by C-G formula based on SCr of 0.7 mg/dL).   Medical History: Past Medical History:  Diagnosis Date  . Arthritis   . COPD (chronic obstructive pulmonary disease) ([place])   . Depression   . Diabetes mellitus type 2 in obese ([place])   . GERD (gastroesophageal reflux disease)   . Hyperlipidemia   . Hypertension   . Morbid obesity ([place])   . OAB (overactive bladder)   . PSVT (paroxysmal supraventricular tachycardia) ([place])   . Thyroid disease   . Vitamin D deficiency     Medications:  See electronic med rec  Assessment: 71 y.o. F presents after being sent by PCP for abnormal EKG. Pt on coumadin PTA for afib. Admission INR 1.25 (subtherapeutic). Hgb 10.3 - low but stable for this patient. Plt wnl. Home dose: 5mg  daily except for 2.5mg  on Sun and Wed - last taken 2/25 1800  Goal of Therapy:  INR 2-3 Monitor platelets by anticoagulation protocol: Yes   Plan:  Coumadin 6mg  now Daily INR  Sherlon Handing, PharmD, BCPS Clinical pharmacist, pager [phone] 11/11/2016,11:36 PM

## 2016-11-12 DIAGNOSIS — I4891 Unspecified atrial fibrillation: Secondary | ICD-10-CM

## 2016-11-12 DIAGNOSIS — I5033 Acute on chronic diastolic (congestive) heart failure: Secondary | ICD-10-CM

## 2016-11-12 LAB — CBC WITH DIFFERENTIAL/PLATELET
Basophils Absolute: 0 10*3/uL (ref 0.0–0.1)
Basophils Relative: 0 %
Eosinophils Absolute: 0.1 10*3/uL (ref 0.0–0.7)
Eosinophils Relative: 2 %
HEMATOCRIT: 37.3 % (ref 36.0–46.0)
Hemoglobin: 9.9 g/dL — ABNORMAL LOW (ref 12.0–15.0)
LYMPHS PCT: 22 %
Lymphs Abs: 1.8 10*3/uL (ref 0.7–4.0)
MCH: 21.8 pg — ABNORMAL LOW (ref 26.0–34.0)
MCHC: 26.5 g/dL — AB (ref 30.0–36.0)
MCV: 82.2 fL (ref 78.0–100.0)
MONO ABS: 0.7 10*3/uL (ref 0.1–1.0)
MONOS PCT: 9 %
NEUTROS ABS: 5.4 10*3/uL (ref 1.7–7.7)
Neutrophils Relative %: 67 %
Platelets: 276 10*3/uL (ref 150–400)
RBC: 4.54 MIL/uL (ref 3.87–5.11)
RDW: 18.5 % — AB (ref 11.5–15.5)
WBC: 8 10*3/uL (ref 4.0–10.5)

## 2016-11-12 LAB — COMPREHENSIVE METABOLIC PANEL
ALBUMIN: 3.3 g/dL — AB (ref 3.5–5.0)
ALT: 15 U/L (ref 14–54)
AST: 17 U/L (ref 15–41)
Alkaline Phosphatase: 71 U/L (ref 38–126)
Anion gap: 10 (ref 5–15)
BUN: 14 mg/dL (ref 6–20)
CHLORIDE: 95 mmol/L — AB (ref 101–111)
CO2: 37 mmol/L — AB (ref 22–32)
Calcium: 9.1 mg/dL (ref 8.9–10.3)
Creatinine, Ser: 0.73 mg/dL (ref 0.44–1.00)
GFR calc non Af Amer: >60 mL/min (ref >60)
GLUCOSE: 131 mg/dL — AB (ref 65–99)
Potassium: 3.9 mmol/L (ref 3.5–5.1)
SODIUM: 142 mmol/L (ref 135–145)
Total Bilirubin: 0.8 mg/dL (ref 0.3–1.2)
Total Protein: 6.1 g/dL — ABNORMAL LOW (ref 6.5–8.1)

## 2016-11-12 LAB — PROCALCITONIN: Procalcitonin: <0.1 ng/mL

## 2016-11-12 LAB — GLUCOSE, CAPILLARY
GLUCOSE-CAPILLARY: 151 mg/dL — AB (ref 65–99)
GLUCOSE-CAPILLARY: 122 mg/dL — AB (ref 65–99)
GLUCOSE-CAPILLARY: 119 mg/dL — AB (ref 65–99)
Glucose-Capillary: 124 mg/dL — ABNORMAL HIGH (ref 65–99)

## 2016-11-12 LAB — TROPONIN I
Troponin I: <0.03 ng/mL (ref <0.03)
Troponin I: <0.03 ng/mL (ref <0.03)
Troponin I: <0.03 ng/mL (ref <0.03)

## 2016-11-12 LAB — TSH: TSH: 6.576 u[IU]/mL — ABNORMAL HIGH (ref 0.350–4.500)

## 2016-11-12 LAB — PROTIME-INR
INR: 1.27
Prothrombin Time: 16 seconds — ABNORMAL HIGH (ref 11.4–15.2)

## 2016-11-12 LAB — MRSA PCR SCREENING: MRSA BY PCR: NEGATIVE

## 2016-11-12 LAB — MAGNESIUM: MAGNESIUM: 1.8 mg/dL (ref 1.7–2.4)

## 2016-11-12 MED ORDER — FUROSEMIDE 10 MG/ML IJ SOLN
80.0000 mg | Freq: Two times a day (BID) | INTRAMUSCULAR | Status: DC
  Administered 2016-11-12 – 2016-11-13 (×3): 80 mg via INTRAVENOUS
  Filled 2016-11-12 (×3): qty 8

## 2016-11-12 MED ORDER — DILTIAZEM HCL 100 MG IV SOLR
5.0000 mg/h | INTRAVENOUS | Status: DC
  Administered 2016-11-12 – 2016-11-14 (×4): 5 mg/h via INTRAVENOUS
  Filled 2016-11-12 (×4): qty 100

## 2016-11-12 MED ORDER — METOPROLOL TARTRATE 50 MG PO TABS
50.0000 mg | ORAL_TABLET | Freq: Two times a day (BID) | ORAL | Status: DC
  Administered 2016-11-12 – 2016-11-13 (×2): 50 mg via ORAL
  Filled 2016-11-12 (×2): qty 1

## 2016-11-12 MED ORDER — WARFARIN SODIUM 5 MG PO TABS
6.0000 mg | ORAL_TABLET | Freq: Once | ORAL | Status: AC
  Administered 2016-11-12: 6 mg via ORAL
  Filled 2016-11-12: qty 1

## 2016-11-12 NOTE — Progress Notes (Signed)
ANTICOAGULATION CONSULT NOTE - [facility] for Coumadin Indication: atrial fibrillation  Allergies  Allergen Reactions  . Inderal [Propranolol] Other (See Comments)    Hair loss  . Ace Inhibitors Cough    Patient Measurements: Height: 5\' 5"  (165.1 cm) Weight: 265 lb 9.6 oz (120.5 kg) IBW/kg (Calculated) : 57  Vital Signs: Temp: 97.9 F (36.6 C) (02/27 0832) Temp Source: Axillary (02/27 0832) BP: 120/84 (02/27 0832) Pulse Rate: 100 (02/27 0832)  Labs:  Recent Labs  11/11/16 1853 11/12/16 0018 11/12/16 0507 11/12/16 0929  HGB 10.3*  --  9.9*  --   HCT 38.7  --  37.3  --   PLT 323  --  276  --   LABPROT 15.8*  --  16.0*  --   INR 1.25  --  1.27  --   CREATININE 0.70  --  0.73  --   TROPONINI  --  <0.03 <0.03 <0.03    Estimated Creatinine Clearance: 85.1 mL/min (by C-G formula based on SCr of 0.73 mg/dL).   Medical History: Past Medical History:  Diagnosis Date  . Arthritis   . COPD (chronic obstructive pulmonary disease) ([place])   . Depression   . Diabetes mellitus type 2 in obese ([place])   . GERD (gastroesophageal reflux disease)   . Hyperlipidemia   . Hypertension   . Morbid obesity ([place])   . OAB (overactive bladder)   . PSVT (paroxysmal supraventricular tachycardia) ([place])   . Thyroid disease   . Vitamin D deficiency     Medications:  See electronic med rec Coumadin 5mg  daily except 2.5mg  on Sun and Wed  Assessment: 71 y.o. F presents after being sent by PCP for abnormal EKG. Pt on coumadin PTA for afib. INR 1.27.  Goal of Therapy:  INR 2-3 Monitor platelets by anticoagulation protocol: Yes   Plan:  Repeat Coumadin 6mg  PO x 1 tonight Daily INR  [employer], Pharm.D., BCPS Clinical Pharmacist Pager [phone] 11/12/2016 10:49 AM

## 2016-11-12 NOTE — Progress Notes (Signed)
Placed pt on cpap for the night, pt seems anxious with cpap. RT made sure pt felt comfortable with the pressures set and the mask. RN is aware. RT informed pt if she wants to take cpap off at any point during the night to call for RN or RT for pt to be placed back on nasal cannula. RT to continue to monitor as needed

## 2016-11-12 NOTE — Progress Notes (Signed)
Pt HR goes into the 130s while going to the bedside commode. O2 sats decrease to mid 80s on 5L O2. Pt HR 100-120s when back in bed and O2 sats are low 90s on 5L O2. Will continue to monitor.

## 2016-11-12 NOTE — Consult Note (Addendum)
CARDIOLOGY CONSULT NOTE   Patient ID: Jillian Wood MRN: [id number] DOB/AGE: June 11, 1946 71 y.o.  Admit date: 11/11/2016  Requesting Physician: Dr. Sloan Leiter Primary Physician:   Alesia Richards, MD Primary Cardiologist:  Dr. Gwenlyn Found Dr. Lovena Le (EP) Reason for Consultation:  Atrial flutter with RVR and A/C D CHF.   [id number] Jillian VOLKERS is a 71 y.o. 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  Admitted in 08/2016 for COPD exacerbation and diagnosed with atrial flutter. Placed on metoprolol, Cardizem and Coumadin. CHA2DS2VASc=5 (age x 1, female, DM, HTN, CHF). Echo w/ EF nl, PAS elevated, AS mild, LA dilatation was severe. Patient was volume overloaded, started on Lasix 20 mg a day.  Lasix was later increased to 80mg  daily and metolazone was added and later discontnued.  Patient is on Diamox for chronic carbon dioxide retention.  She was seen by Rosaria Ferries PA-C in the office on 10/18/16. Felt to be euvolemic. weight 257 lbs. She was tachycardic but in NSR by ECG at that time (although I am not convinced this was sinus).   She has been doing okay but started to feel generally unwell (fatigue, low energy) a couple days ago. No chest pain or worsening SOB. She has chronic LE edema which is no worse than usual. No orthopnea or PND. No palpitations or dizziness.   She was scheduled to see her PCP for yearly exam on 11/11/16. In the office she was noted to with aflutter with RVR and referred to ED for admission to the hospital.   ED course: she was given IV diltiazem and eventually placed on a gtt. BNP mildly elevated at 352. CXR with cardiomegaly with interstitial edema, bibasilar collapse/consolidation, and  small effusions. She was started on IV lasix and now with a foley catheter in place because it is very difficult for her to get up to go to the bathroom. Diamox discontinued by IM.    Past Medical History:  Diagnosis Date  . Arthritis   . COPD (chronic obstructive pulmonary disease) ([place])   . Depression   . Diabetes mellitus type 2 in obese ([place])   . GERD (gastroesophageal reflux disease)   . Hyperlipidemia   . Hypertension   . Morbid obesity ([place])   . OAB (overactive bladder)   . PSVT (paroxysmal supraventricular tachycardia) ([place])   . Thyroid disease   . Vitamin D deficiency      Past Surgical History:  Procedure Laterality Date  . CARPAL TUNNEL RELEASE     L 1992 R 1984  . EYE SURGERY Bilateral    cataract  . NM MYOVIEW LTD  01/2011   Dobutamine Myoview: Negative perfusion scan for ischemia / infarct (poor image capture); Pt developed SVT with Dobutamine that reproduced her CP as it resolved with restoration of NSR.  Marland Kitchen PUBOVAGINAL SLING    . TONSILLECTOMY AND ADENOIDECTOMY    . TRANSTHORACIC ECHOCARDIOGRAM  01/2011   Hyperdynamic LV with EF 65-70%, Gr 1 DD, Mild Ao Stenosis - mean gradient ~19 mmHg    Allergies  Allergen Reactions  . Inderal [Propranolol] Other (See Comments)    Hair loss  . Ace Inhibitors Cough    I have reviewed the  patient's current medications . aspirin  81 mg Oral Daily  . budesonide (PULMICORT) nebulizer solution  0.25 mg Nebulization BID  . diltiazem  300 mg Oral Daily  . DULoxetine  60 mg Oral Daily  . famotidine  20 mg Oral BID  . ferrous sulfate  325 mg Oral BID WC  . furosemide  80 mg Intravenous Q12H  . guaiFENesin  600 mg Oral Daily  . insulin aspart  0-9 Units Subcutaneous TID WC  . levalbuterol  0.63 mg Nebulization Q4H  . levothyroxine  175 mcg Oral QAC breakfast  . losartan  50 mg Oral Daily  . metoprolol tartrate  25 mg Oral BID  . potassium chloride  20 mEq Oral Daily  . pravastatin  40 mg Oral q1800  . warfarin  6 mg Oral  ONCE-1800  . Warfarin - Pharmacist Dosing Inpatient   Does not apply q1800   . diltiazem (CARDIZEM) infusion 7.5 mg/hr (11/12/16 1227)   acetaminophen **OR** acetaminophen, fluticasone, levalbuterol, ondansetron **OR** ondansetron (ZOFRAN) IV  Prior to Admission medications   Medication Sig Start Date End Date Taking? Authorizing Provider  acetaminophen-codeine (TYLENOL #3) 300-30 MG per tablet Take 1 tablet by mouth every 8 (eight) hours as needed for moderate pain or severe pain. 06/07/15  Yes [name], PA-C  acetaZOLAMIDE (DIAMOX) 125 MG tablet Take 1 tablet 3 x/ day Patient taking differently: Take 125 mg by mouth 3 (three) times daily.  08/13/16 02/10/17 Yes Unk Pinto, MD  albuterol (PROVENTIL HFA;VENTOLIN HFA) 108 (90 Base) MCG/ACT inhaler Inhale 1-2 puffs into the lungs every 6 (six) hours as needed for wheezing or shortness of breath. 06/06/16  Yes Courtney Forcucci, PA-C  aspirin 81 MG tablet Take 81 mg by mouth daily.   Yes Historical Provider, MD  budesonide-formoterol (SYMBICORT) 160-4.5 MCG/ACT inhaler 2 puffs twice a day, wash mouth afterwards Patient taking differently: Inhale 2 puffs into the lungs 2 (two) times daily.  08/11/16  Yes Doreatha Lew, MD  Cholecalciferol (VITAMIN D PO) Take 2,000 Units by mouth daily.    Yes Historical Provider, MD  diltiazem (CARDIZEM CD) 300 MG 24 hr capsule Take 1 capsule (300 mg total) by mouth daily. 09/13/16  Yes Unk Pinto, MD  DULoxetine (CYMBALTA) 60 MG capsule TAKE 1 CAPSULE BY MOUTH DAILY 08/24/16  Yes Unk Pinto, MD  famotidine (PEPCID) 20 MG tablet TAKE 1 TABLET (20 MG TOTAL) BY MOUTH 2 (TWO) TIMES DAILY. FOR ACID REFLUX 09/19/16  Yes Unk Pinto, MD  ferrous sulfate 325 (65 FE) MG tablet Take 325 mg by mouth 2 (two) times daily with a meal.   Yes Historical Provider, MD  fluticasone (FLONASE) 50 MCG/ACT nasal spray Place 2 sprays into both nostrils daily. Patient taking differently: Place 2 sprays into both  nostrils daily as needed for allergies.  10/29/16  Yes Courtney Forcucci, PA-C  furosemide (LASIX) 80 MG tablet Take 1 tablet (80 mg total) by mouth daily. Patient taking differently: Take 40-80 mg by mouth daily as needed for fluid.  09/17/16  Yes Unk Pinto, MD  guaiFENesin (MUCINEX) 600 MG 12 hr tablet Take 600 mg by mouth daily.   Yes Historical Provider, MD  levothyroxine (SYNTHROID, LEVOTHROID) 175 MCG tablet TAKE 1 TABLET (175 MCG TOTAL) BY MOUTH DAILY BEFORE BREAKFAST. 09/18/16  Yes [name], PA-C  losartan (COZAAR) 50 MG tablet TAKE 1 TABLET BY MOUTH DAILY 09/29/16  Yes [name], PA-C  Magnesium Chloride (MAGNESIUM DR PO) Take 1 capsule by mouth daily.  Yes Historical Provider, MD  metFORMIN (GLUCOPHAGE-XR) 500 MG 24 hr tablet TAKE 1 TABLET (500 MG TOTAL) BY MOUTH 4 (FOUR) TIMES DAILY - AFTER MEALS AND AT BEDTIME. 10/14/16  Yes Unk Pinto, MD  metoprolol tartrate (LOPRESSOR) 25 MG tablet Take 1 tablet (25 mg total) by mouth 2 (two) times daily. 09/13/16  Yes Unk Pinto, MD  OXYGEN-HELIUM IN Inhale 5 L into the lungs.    Yes Historical Provider, MD  pravastatin (PRAVACHOL) 40 MG tablet TAKE 1 TABLET (40 MG TOTAL) BY MOUTH DAILY. 03/07/16  Yes Unk Pinto, MD  predniSONE (DELTASONE) 20 MG tablet 3 tabs po daily x 3 days, then 2 tabs x 3 days, then 1.5 tabs x 3 days, then 1 tab x 3 days, then 0.5 tabs x 3 days 10/29/16  Yes Courtney Forcucci, PA-C  warfarin (COUMADIN) 5 MG tablet TAKE 1 TABLET (5 MG TOTAL) BY MOUTH ONE TIME ONLY AT 6 PM. Patient taking differently: Take 5 mg by mouth See admin instructions. Take 2.5 mg by mouth daily on Sunday and Wednesday. Take 5 mg by mouth daily on all other days 10/22/16  Yes Unk Pinto, MD  loratadine-pseudoephedrine (CLARITIN-D 24-HOUR) 10-240 MG per 24 hr tablet Take 1 tablet by mouth as needed for allergies.     Historical Provider, MD  metolazone (ZAROXOLYN) 5 MG tablet TAKE 1 TABLET (5 MG TOTAL) BY MOUTH DAILY. Patient not  taking: Reported on 11/11/2016 10/14/16   [name], PA-C     Social History   Social History  . Marital status: Married    Spouse name: N/A  . Number of children: N/A  . Years of education: N/A   Occupational History  . Not on file.   Social History Main Topics  . Smoking status: Former Smoker    Packs/day: 0.50    Years: 44.00    Types: Cigarettes    Quit date: 08/15/2016  . Smokeless tobacco: Never Used     Comment: smoke about 5-6 cigarette daily  . Alcohol use No  . Drug use: No  . Sexual activity: Not on file   Other Topics Concern  . Not on file   Social History Narrative  . No narrative on file    Family Status  Relation Status  . Mother Deceased  . Father Deceased  . Sister Alive  . Brother Deceased at age 61   leukemia   Family History  Problem Relation Age of Onset  . Alcohol abuse Father   . Heart disease Father       ROS:  Full 14 point review of systems complete and found to be negative unless listed above.  Physical Exam: Blood pressure 110/75, pulse (!) 103, temperature 99 F (37.2 C), temperature source Oral, resp. rate (!) 22, height 5\' 5"  (1.651 m), weight 265 lb 9.6 oz (120.5 kg), SpO2 93 %.  General: Well developed, well nourished, female in no acute distress, morbidly obese, chronically ill appearing.  Head: Eyes PERRLA, No xanthomas.   Normocephalic and atraumatic, oropharynx without edema or exudate Lungs: decreased breath sounds at bases Heart: HRRR S1 S2, no rub/gallop, Heart irregular rate and rhythm with S1, S2 + murmur. pulses are 2+ extrem.   Neck: No carotid bruits. No lymphadenopathy. Hard to assess JVD given body habitus. Abdomen: Bowel sounds present, abdomen soft and non-tender without masses or hernias noted. Msk:  No spine or cva tenderness. No weakness, no joint deformities or effusions. Extremities: No clubbing or cyanosis. 1-2 + LE bilaterlal  edema.  Neuro: Alert and oriented X 3. No focal deficits noted. Psych:   Good affect, responds appropriately Skin: No rashes or lesions noted.  Labs:   Lab Results  Component Value Date   WBC 8.0 11/12/2016   HGB 9.9 (L) 11/12/2016   HCT 37.3 11/12/2016   MCV 82.2 11/12/2016   PLT 276 11/12/2016    Recent Labs  11/12/16 0507  INR 1.27    Recent Labs Lab 11/12/16 0507  NA 142  K 3.9  CL 95*  CO2 37*  BUN 14  CREATININE 0.73  CALCIUM 9.1  PROT 6.1*  BILITOT 0.8  ALKPHOS 71  ALT 15  AST 17  GLUCOSE 131*  ALBUMIN 3.3*   Magnesium  Date Value Ref Range Status  11/12/2016 1.8 1.7 - 2.4 mg/dL Final    Recent Labs  11/12/16 0018 11/12/16 0507 11/12/16 0929  TROPONINI <0.03 <0.03 <0.03    Recent Labs  11/11/16 1853  TROPIPOC 0.01   Pro B Natriuretic peptide (BNP)  Date/Time Value Ref Range Status  10/23/2009 01:20 AM 224.0 (H) 0.0 - 100.0 pg/mL Final   Lab Results  Component Value Date   CHOL 180 06/06/2016   HDL 75 06/06/2016   LDLCALC 68 06/06/2016   TRIG 187 (H) 06/06/2016   No results found for: DDIMER No results found for: LIPASE, AMYLASE TSH  Date/Time Value Ref Range Status  11/12/2016 12:18 AM 6.576 (H) 0.350 - 4.500 uIU/mL Final    Comment:    Performed by a 3rd Generation assay with a functional sensitivity of <=0.01 uIU/mL.  09/23/2016 05:01 PM 1.88 mIU/L Final    Comment:      Reference Range   > or = 20 Years  0.40-4.50   Pregnancy Range First trimester  0.26-2.66 Second trimester 0.55-2.73 Third trimester  0.43-2.91      Vitamin B-12  Date/Time Value Ref Range Status  10/08/2016 04:19 PM 359 200 - 1,100 pg/mL Final   Ferritin  Date/Time Value Ref Range Status  10/08/2016 04:19 PM 14 (L) 20 - 288 ng/mL Final   TIBC  Date/Time Value Ref Range Status  10/08/2016 04:19 PM 619 (H) 250 - 450 ug/dL Final   Iron  Date/Time Value Ref Range Status  10/08/2016 04:19 PM 23 (L) 45 - 160 ug/dL Final   Retic Ct Pct  Date/Time Value Ref Range Status  10/08/2016 04:19 PM 2.5 % Final    Echo:  08/02/2016 LV EF: 65% -   70% Study Conclusions - Left ventricle: The cavity size was moderately reduced. Wall   thickness was increased in a pattern of moderate LVH. The   estimated ejection fraction was in the range of 65% to 70%. Wall   motion was normal; there were no regional wall motion   abnormalities. - Aortic valve: Severely calcified annulus. Moderately thickened,   moderately calcified leaflets. There was mild stenosis. Valve   area (VTI): 1.05 cm^2. Valve area (Vmax): 1.09 cm^2. Valve area   (Vmean): 1.05 cm^2. - Mitral valve: Moderately calcified annulus. - Left atrium: The atrium was severely dilated. - Right atrium: The atrium was mildly dilated. - Pulmonary arteries: Systolic pressure was severely increased. PA   peak pressure: 70 mm Hg (S).   ECG:  Aflutter with RVR  Radiology:  Dg Chest Portable 1 View  Result Date: 11/11/2016 CLINICAL DATA:  Abnormal EKG. EXAM: PORTABLE CHEST 1 VIEW COMPARISON:  07/31/2016 FINDINGS: 1857 hours. Low volume film. The cardio pericardial silhouette is enlarged. Pulmonary vascular  congestion noted with probable interstitial pulmonary edema. There is bibasilar collapse/consolidation with small bilateral pleural effusions. The visualized bony structures of the thorax are intact. Telemetry leads overlie the chest. IMPRESSION: Cardiomegaly with interstitial edema, bibasilar collapse/ consolidation, and small effusions. Electronically Signed   By: Misty Stanley M.D.   On: 11/11/2016 19:09    ASSESSMENT AND PLAN:    Principal Problem:   Atrial fibrillation with rapid ventricular response (HCC) Active Problems:   Essential hypertension   COPD (chronic obstructive pulmonary disease) (HCC)   Acute on chronic diastolic CHF (congestive heart failure) (HCC)   Acute on chronic respiratory failure with hypoxia (HCC)   Dilated cardiomyopathy (HCC)   CHF (congestive heart failure) (HCC)  Jillian Wood is a 71 y.o. 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  Atrial flutter with RVR: currently on a Cardizem infusion. HR in 90s. Continue home meds of Cardizem 300mg  daily and Lopressor 25mg  BID. Will increase Lopressor to 50mg  BID. Will stop home losartan to make more room for AVN blocking agents. Wean Cardizem gtt as able. Her last echo in 07/2016 showed severe LAE. Low likelihood she will maintain NSR over time. She is essentially asymptomatic. Would persue rate control strategy at this time.  -- Continue Coumadin-CHA2DVASC score of at least 5. INR is subtheraputic at 1.27. Coumadin per pharmacy.  Acute on chronic diastolic CHF: BNP mildly elevated but can be falsely low in the setting of morbid obesity. CXR with some pulmonary vascular congestion. Continue IV lasix 80mg  BID. Net neg 3L. Weight down 2 lbs. Dry weight likely around 257 lbs.   COPD with chronic hypoxemic respiratory failure on 5 L of oxygen at home: Appears stable.  Hypertension: BP well controlled currently   Type 2 diabetes: continue SSI. Resume metformin on discharge.  Hypothyroidism: TSH 6.5 (mildly elevated). Continue with Synthroid  Dyslipidemia: Continue statin.  OSA: continue CPAP. Question compliance at home  Signed: Angelena Form, [name] 11/12/2016 2:44 PM  Pager [phone]  Co-Sign MD  Patient seen and examined. Agree with assessment and plan. Ms Freyre is a 71 year old female who has a history of severe COPD on 5 L of oxygen at home with CO2 retention for which she has been on acetazolamide. She has a history of hypertension, PSVT, PAF, diabetes mellitus, hyperlipidemia, obstructive sleep apnea on CPAP therapy, morbid obesity, and diastolic heart failure.  She has been  on Coumadin for anticoagulation.  It was started in December when she presented with atrial flutter and COPD exacerbation.  She also has mild aortic stenosis and severe left atrial dilatation.  Her last echo in November 2017 showed an EF of 65-70% with moderate LVH and normal wall motion.  There was significant calcification of her aortic annulus with mild stenosis and there was moderate mitral annular calcification.  She had significant pulmonary hypertension with a PA estimated systolic pressure at 70 mm.  She was seen by primary M.D. today not feeling well and was found to be in atrial flutter with variable  block with a heart rate at 117.  There is right bundle branch block and T-wave abnormalities.  She is now on a Cardizem drip and heart rate has improved to 95-105  range.  She has been on long-acting Cardizem 300 mg daily and metoprolol 25 twice a day at home in addition to losartan.  Her blood pressure now is in the 105-110 range.  JVD up to 7-8 cm.  She decreased breath sounds at her bases.  Rhythm was regular with [id number] systolic murmur, her abdomen is soft with positive bowel sounds.  She has 1-2+ lower extremity bilateral edema.  Neurologic exam is nonfocal.  Troponins are negative 3.  Magnesium 1.8.  CO2 is increased at 37.  TSH is minimally increased at 6.576.  She is now on IV Lasix for diuresis.  On Coumadin 6 mg, INR is subtherapeutic at 1.27 and Lovenox or heparin should be instituted until she is therapeutic.  We will follow patient with you. Troy Sine, MD, [facility] 11/12/2016 5:13 PM

## 2016-11-12 NOTE — Progress Notes (Signed)
PROGRESS NOTE        PATIENT DETAILS Name: Jillian Wood Age: 71 y.o. Sex: female Date of Birth: 02-10-1946 Admit Date: 11/11/2016 Admitting Physician Rise Patience, MD [id number] DAVID, MD  Brief Narrative: Patient is a 71 y.o. female with history of chronic hypoxemic respiratory failure on 5 L of oxygen at home, COPD, atrial fibrillation on chronic anticoagulation referred to the ED by PCP for evaluation of tachycardia, found to have atrial fibrillation with RVR and decompensated diastolic heart failure. Admitted for further evaluation and treatment  Subjective: Essentially unchanged-still with uncontrolled rate this morning. Continues to have lower extremity edema.  Assessment/Plan: Atrial fibrillation with RVR: Continue Cardizem infusion, we will attempt to wean off Cardizem over the next few hours. Continue Coumadin-CHA2DVASC score of atleast 4.  Acute on chronic diastolic CHF: Does have volume overload on exam-approximately 15 pound weight gain in the past few weeks. Stop Diamox-increase Lasix to twice a day dosing. Follow weights, intake/output.  COPD with chronic hypoxemic respiratory failure on 5 L of oxygen at home: Appears stable-continue with current bronchodilator regimen  Hypertension: Controlled, continue losartan, Cardizem and metoprolol.  Type 2 diabetes: CBGs stable, continue SSI. Resume metformin on discharge.  Hypothyroidism: Continue with Synthroid  Dyslipidemia: Continue statin.  DVT Prophylaxis: Full dose anticoagulation with Coumadin  Code Status: Full code  Family Communication: None at bedside  Disposition Plan: Remain inpatient-requires a few more days of hospitalization prior to discharge.  Antimicrobial agents: Anti-infectives    None     Procedures: None  CONSULTS:  cardiology  Time spent: 25- minutes-Greater than 50% of this time was spent in counseling, explanation of diagnosis, planning  of further management, and coordination of care.  MEDICATIONS: Scheduled Meds: . aspirin  81 mg Oral Daily  . budesonide (PULMICORT) nebulizer solution  0.25 mg Nebulization BID  . diltiazem  300 mg Oral Daily  . DULoxetine  60 mg Oral Daily  . famotidine  20 mg Oral BID  . ferrous sulfate  325 mg Oral BID WC  . furosemide  80 mg Intravenous Q12H  . guaiFENesin  600 mg Oral Daily  . insulin aspart  0-9 Units Subcutaneous TID WC  . levalbuterol  0.63 mg Nebulization Q4H  . levothyroxine  175 mcg Oral QAC breakfast  . losartan  50 mg Oral Daily  . metoprolol tartrate  25 mg Oral BID  . potassium chloride  20 mEq Oral Daily  . pravastatin  40 mg Oral q1800  . warfarin  6 mg Oral ONCE-1800  . Warfarin - Pharmacist Dosing Inpatient   Does not apply q1800   Continuous Infusions: . diltiazem (CARDIZEM) infusion 7.5 mg/hr (11/12/16 1227)   PRN Meds:.acetaminophen **OR** acetaminophen, fluticasone, levalbuterol, ondansetron **OR** ondansetron (ZOFRAN) IV   PHYSICAL EXAM: Vital signs: Vitals:   11/12/16 0826 11/12/16 0827 11/12/16 0832 11/12/16 1121  BP:   120/84 110/75  Pulse:   100 (!) 103  Resp:   (!) 22 (!) 22  Temp:   97.9 F (36.6 C) 99 F (37.2 C)  TempSrc:   Axillary Oral  SpO2: 95% 96% 98% 92%  Weight:      Height:       Filed Weights   11/11/16 1938 11/12/16 0122  Weight: 121.1 kg (267 lb) 120.5 kg (265 lb 9.6 oz)   Body mass index is 44.2 kg/m.  General appearance :Awake, alert, not in any distress. Speech Clear.  Eyes:, pupils equally reactive to light and accomodation,no scleral icterus. HEENT: Atraumatic and Normocephalic Neck: supple, no JVD. No cervical lymphadenopathy.  Resp:Good air entry bilaterally, no added sounds  CVS: S1 S2 regular, tachycardic GI: Bowel sounds present, Non tender and not distended with no gaurding, rigidity or rebound Extremities: B/L Lower Ext shows no edema, both legs are warm to touch Neurology:  speech clear,Non focal,  sensation is grossly intact. Psychiatric: Normal judgment and insight. Alert and oriented x 3.  Musculoskeletal:No digital cyanosis Skin:No Rash, warm and dry Wounds:N/A  I have personally reviewed following labs and imaging studies  LABORATORY DATA: CBC:  Recent Labs Lab 11/11/16 1853 11/12/16 0507  WBC 9.0 8.0  NEUTROABS 6.7 5.4  HGB 10.3* 9.9*  HCT 38.7 37.3  MCV 83.0 82.2  PLT 323 [id number]    Basic Metabolic Panel:  Recent Labs Lab 11/11/16 1853 11/12/16 0018 11/12/16 0507  NA 141  --  142  K 4.5  --  3.9  CL 100*  --  95*  CO2 33*  --  37*  GLUCOSE 119*  --  131*  BUN 15  --  14  CREATININE 0.70  --  0.73  CALCIUM 8.9  --  9.1  MG  --  1.8  --     GFR: Estimated Creatinine Clearance: 85.1 mL/min (by C-G formula based on SCr of 0.73 mg/dL).  Liver Function Tests:  Recent Labs Lab 11/11/16 1853 11/12/16 0507  AST 18 17  ALT 15 15  ALKPHOS 73 71  BILITOT 0.6 0.8  PROT 6.4* 6.1*  ALBUMIN 3.4* 3.3*   No results for input(s): LIPASE, AMYLASE in the last 168 hours. No results for input(s): AMMONIA in the last 168 hours.  Coagulation Profile:  Recent Labs Lab 11/11/16 1853 11/12/16 0507  INR 1.25 1.27    Cardiac Enzymes:  Recent Labs Lab 11/12/16 0018 11/12/16 0507 11/12/16 0929  TROPONINI <0.03 <0.03 <0.03    BNP (last 3 results) No results for input(s): PROBNP in the last 8760 hours.  HbA1C: No results for input(s): HGBA1C in the last 72 hours.  CBG:  Recent Labs Lab 11/12/16 0909 11/12/16 1120  GLUCAP 124* 151*    Lipid Profile: No results for input(s): CHOL, HDL, LDLCALC, TRIG, CHOLHDL, LDLDIRECT in the last 72 hours.  Thyroid Function Tests:  Recent Labs  11/12/16 0018  TSH 6.576*    Anemia Panel: No results for input(s): VITAMINB12, FOLATE, FERRITIN, TIBC, IRON, RETICCTPCT in the last 72 hours.  Urine analysis:    Component Value Date/Time   COLORURINE YELLOW 07/27/2015 1612   APPEARANCEUR CLEAR 07/27/2015  1612   LABSPEC 1.017 07/27/2015 1612   PHURINE 6.0 07/27/2015 1612   GLUCOSEU NEGATIVE 07/27/2015 1612   HGBUR 2+ (A) 07/27/2015 1612   BILIRUBINUR NEGATIVE 07/27/2015 1612   KETONESUR NEGATIVE 07/27/2015 1612   PROTEINUR NEGATIVE 07/27/2015 1612   UROBILINOGEN 1.0 10/23/2009 1145   NITRITE NEGATIVE 07/27/2015 1612   LEUKOCYTESUR NEGATIVE 07/27/2015 1612    Sepsis Labs: Lactic Acid, Venous    Component Value Date/Time   LATICACIDVEN 1.36 11/11/2016 2214    MICROBIOLOGY: Recent Results (from the past 240 hour(s))  MRSA PCR Screening     Status: None   Collection Time: 11/12/16  1:50 AM  Result Value Ref Range Status   MRSA by PCR NEGATIVE NEGATIVE Final    Comment:        The GeneXpert MRSA Assay (  FDA approved for NASAL specimens only), is one component of a comprehensive MRSA colonization surveillance program. It is not intended to diagnose MRSA infection nor to guide or monitor treatment for MRSA infections.     RADIOLOGY STUDIES/RESULTS: Dg Chest Portable 1 View  Result Date: 11/11/2016 CLINICAL DATA:  Abnormal EKG. EXAM: PORTABLE CHEST 1 VIEW COMPARISON:  07/31/2016 FINDINGS: 1857 hours. Low volume film. The cardio pericardial silhouette is enlarged. Pulmonary vascular congestion noted with probable interstitial pulmonary edema. There is bibasilar collapse/consolidation with small bilateral pleural effusions. The visualized bony structures of the thorax are intact. Telemetry leads overlie the chest. IMPRESSION: Cardiomegaly with interstitial edema, bibasilar collapse/ consolidation, and small effusions. Electronically Signed   By: Misty Stanley M.D.   On: 11/11/2016 19:09     LOS: 1 day   Oren Binet, MD  [facility] Pager:[phone]  If 7PM-7AM, please contact night-coverage [email or web address] Password [facility] 11/12/2016, 12:32 PM

## 2016-11-13 DIAGNOSIS — J449 Chronic obstructive pulmonary disease, unspecified: Secondary | ICD-10-CM

## 2016-11-13 DIAGNOSIS — I483 Typical atrial flutter: Secondary | ICD-10-CM

## 2016-11-13 DIAGNOSIS — I10 Essential (primary) hypertension: Secondary | ICD-10-CM

## 2016-11-13 LAB — BASIC METABOLIC PANEL
ANION GAP: 6 (ref 5–15)
BUN: 12 mg/dL (ref 6–20)
CALCIUM: 9.1 mg/dL (ref 8.9–10.3)
CO2: 46 mmol/L — ABNORMAL HIGH (ref 22–32)
Chloride: 91 mmol/L — ABNORMAL LOW (ref 101–111)
Creatinine, Ser: 0.74 mg/dL (ref 0.44–1.00)
GFR calc Af Amer: >60 mL/min (ref >60)
GLUCOSE: 140 mg/dL — AB (ref 65–99)
Potassium: 3.7 mmol/L (ref 3.5–5.1)
Sodium: 143 mmol/L (ref 135–145)

## 2016-11-13 LAB — GLUCOSE, CAPILLARY
GLUCOSE-CAPILLARY: 113 mg/dL — AB (ref 65–99)
Glucose-Capillary: 143 mg/dL — ABNORMAL HIGH (ref 65–99)
Glucose-Capillary: 116 mg/dL — ABNORMAL HIGH (ref 65–99)
Glucose-Capillary: 163 mg/dL — ABNORMAL HIGH (ref 65–99)

## 2016-11-13 LAB — PROTIME-INR
INR: 1.38
PROTHROMBIN TIME: 17.1 s — AB (ref 11.4–15.2)

## 2016-11-13 MED ORDER — ENOXAPARIN SODIUM 120 MG/0.8ML ~~LOC~~ SOLN
120.0000 mg | Freq: Two times a day (BID) | SUBCUTANEOUS | Status: DC
  Administered 2016-11-13 – 2016-11-15 (×5): 120 mg via SUBCUTANEOUS
  Filled 2016-11-13 (×5): qty 0.8

## 2016-11-13 MED ORDER — WARFARIN SODIUM 7.5 MG PO TABS
7.5000 mg | ORAL_TABLET | Freq: Once | ORAL | Status: AC
  Administered 2016-11-13: 7.5 mg via ORAL
  Filled 2016-11-13: qty 1

## 2016-11-13 MED ORDER — ACETAZOLAMIDE 250 MG PO TABS
250.0000 mg | ORAL_TABLET | Freq: Four times a day (QID) | ORAL | Status: AC
  Administered 2016-11-13 – 2016-11-14 (×3): 250 mg via ORAL
  Filled 2016-11-13 (×5): qty 1

## 2016-11-13 MED ORDER — BISOPROLOL FUMARATE 5 MG PO TABS
10.0000 mg | ORAL_TABLET | Freq: Every day | ORAL | Status: DC
  Administered 2016-11-14 – 2016-11-15 (×2): 10 mg via ORAL
  Filled 2016-11-13 (×2): qty 2

## 2016-11-13 MED ORDER — BISOPROLOL FUMARATE 5 MG PO TABS: 5.0000 mg | ORAL_TABLET | Freq: Every day | ORAL | Status: DC

## 2016-11-13 MED ORDER — LEVALBUTEROL HCL 0.63 MG/3ML IN NEBU
0.6300 mg | INHALATION_SOLUTION | Freq: Four times a day (QID) | RESPIRATORY_TRACT | Status: DC
  Administered 2016-11-13 – 2016-11-15 (×6): 0.63 mg via RESPIRATORY_TRACT
  Filled 2016-11-13 (×8): qty 3

## 2016-11-13 NOTE — Progress Notes (Signed)
Pt with oxygen levels reading in the 70s and 80s. Pulse ox changed on finger oxygen level adujusted to 5l [place] and provider, Ghimire made aware. Lungs were assessed and diminished. Pt with no complaints of SOB. No further orders given at this time. Will continue to monitor pt.

## 2016-11-13 NOTE — Progress Notes (Addendum)
Progress Note  Patient Name: Jillian Wood Date of Encounter: 11/13/2016  Primary Cardiologist: Dr Gwenlyn Found  Subjective   Less SOB than on admission  Inpatient Medications    Scheduled Meds: . aspirin  81 mg Oral Daily  . budesonide (PULMICORT) nebulizer solution  0.25 mg Nebulization BID  . diltiazem  300 mg Oral Daily  . DULoxetine  60 mg Oral Daily  . enoxaparin (LOVENOX) injection  120 mg Subcutaneous Q12H  . famotidine  20 mg Oral BID  . ferrous sulfate  325 mg Oral BID WC  . furosemide  80 mg Intravenous Q12H  . guaiFENesin  600 mg Oral Daily  . insulin aspart  0-9 Units Subcutaneous TID WC  . levalbuterol  0.63 mg Nebulization Q6H  . levothyroxine  175 mcg Oral QAC breakfast  . metoprolol tartrate  50 mg Oral BID  . potassium chloride  20 mEq Oral Daily  . pravastatin  40 mg Oral q1800  . warfarin  7.5 mg Oral ONCE-1800  . Warfarin - Pharmacist Dosing Inpatient   Does not apply q1800   Continuous Infusions: . diltiazem (CARDIZEM) infusion 5 mg/hr (11/13/16 1009)   PRN Meds: acetaminophen **OR** acetaminophen, fluticasone, levalbuterol, ondansetron **OR** ondansetron (ZOFRAN) IV   Vital Signs    Vitals:   11/13/16 0003 11/13/16 0521 11/13/16 0727 11/13/16 0749  BP: (!) 101/56 109/88  (!) 103/49  Pulse: 66 88  89  Resp: 20 20  20   Temp: 98.4 F (36.9 C) 97.7 F (36.5 C)  97.8 F (36.6 C)  TempSrc: Oral Oral  Oral  SpO2: 93% 92% 93% 90%  Weight:  251 lb 6.4 oz (114 kg)    Height:        Intake/Output Summary (Last 24 hours) at 11/13/16 1016 Last data filed at 11/13/16 1009  Gross per 24 hour  Intake              975 ml  Output             6350 ml  Net            -5375 ml   Filed Weights   11/11/16 1938 11/12/16 0122 11/13/16 0521  Weight: 267 lb (121.1 kg) 265 lb 9.6 oz (120.5 kg) 251 lb 6.4 oz (114 kg)    Telemetry    A-flutter with variable VR- 80-100 with PVCs - Personally Reviewed  ECG    A flutter with RBBB - Personally  Reviewed  Physical Exam   GEN: Morbidly obese, on O2 no acute distress.   Neck: No JVD Cardiac: RRR, no murmurs, rubs, or gallops.  Respiratory: Clear to auscultation bilaterally. GI: obese, soft, nontender, non-distended  MS: No edema; No deformity. Neuro:  Nonfocal  Psych: Normal affect   Labs    Chemistry Recent Labs Lab 11/11/16 1853 11/12/16 0507 11/13/16 0342  NA 141 142 143  K 4.5 3.9 3.7  CL 100* 95* 91*  CO2 33* 37* 46*  GLUCOSE 119* 131* 140*  BUN 15 14 12   CREATININE 0.70 0.73 0.74  CALCIUM 8.9 9.1 9.1  PROT 6.4* 6.1*  --   ALBUMIN 3.4* 3.3*  --   AST 18 17  --   ALT 15 15  --   ALKPHOS 73 71  --   BILITOT 0.6 0.8  --   GFRNONAA >60 >60 >60  GFRAA >60 >60 >60  ANIONGAP 8 10 6      Hematology Recent Labs Lab 11/11/16 1853 11/12/16 0507  WBC 9.0 8.0  RBC 4.66 4.54  HGB 10.3* 9.9*  HCT 38.7 37.3  MCV 83.0 82.2  MCH 22.1* 21.8*  MCHC 26.6* 26.5*  RDW 18.6* 18.5*  PLT 323 276    Cardiac Enzymes Recent Labs Lab 11/12/16 0018 11/12/16 0507 11/12/16 0929  TROPONINI <0.03 <0.03 <0.03    Recent Labs Lab 11/11/16 1853  TROPIPOC 0.01     BNP Recent Labs Lab 11/11/16 1853  BNP 352.4*    TSH- 6..66   Radiology    Dg Chest Portable 1 View  Result Date: 11/11/2016 CLINICAL DATA:  Abnormal EKG. EXAM: PORTABLE CHEST 1 VIEW COMPARISON:  07/31/2016 FINDINGS: 1857 hours. Low volume film. The cardio pericardial silhouette is enlarged. Pulmonary vascular congestion noted with probable interstitial pulmonary edema. There is bibasilar collapse/consolidation with small bilateral pleural effusions. The visualized bony structures of the thorax are intact. Telemetry leads overlie the chest. IMPRESSION: Cardiomegaly with interstitial edema, bibasilar collapse/ consolidation, and small effusions. Electronically Signed   By: Misty Stanley M.D.   On: 11/11/2016 19:09    Cardiac Studies   Echo Nov 2017 Study Conclusions  - Left ventricle: The cavity  size was moderately reduced. Wall   thickness was increased in a pattern of moderate LVH. The   estimated ejection fraction was in the range of 65% to 70%. Wall   motion was normal; there were no regional wall motion   abnormalities. - Aortic valve: Severely calcified annulus. Moderately thickened,   moderately calcified leaflets. There was mild stenosis. Valve   area (VTI): 1.05 cm^2. Valve area (Vmax): 1.09 cm^2. Valve area   (Vmean): 1.05 cm^2. - Mitral valve: Moderately calcified annulus. - Left atrium: The atrium was severely dilated. - Right atrium: The atrium was mildly dilated. - Pulmonary arteries: Systolic pressure was severely increased. PA   peak pressure: 70 mm Hg (S).   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  Assessment & Plan    Atrial flutter with RVR: currently on a Cardizem infusion. HR in 90s. Continue home meds of Cardizem 300mg  daily and Lopressor 25mg  BID.  Lopressor increased to 50mg  BID.  Wean Cardizem gtt as able. Her last echo in 07/2016 showed severe LAE. Low likelihood she will maintain NSR over time. Would persue rate control strategy at this time.  -- Continue Coumadin-CHA2DVASC score of at least 5. INR is subtheraputic at 1.27. Coumadin/Lovenox per pharmacy.  Acute on chronic diastolic CHF: BNP mildly elevated but can be falsely low in the setting of morbid obesity. CXR with some pulmonary vascular congestion. Continue IV lasix 80mg  BID. I/O neg 7.9L since adm. Weight 267 on adm-down to 251.   COPD with chronic hypoxemic respiratory failure on 5 L of oxygen at home: Appears stable.  Hypertension:BP well controlled currently    Type 2 diabetes: continue SSI. Resume metformin on discharge.  Hypothyroidism:TSH 6.5 (mildly elevated). Continue with Synthroid  Dyslipidemia: Continue statin.  OSA: continue CPAP. Question compliance at home  Plan: Pt is on Diltiazem 300 mg at home. Consider Diltiazem CD 180 mg BID.    Angelena Form, PA-C  11/13/2016, 10:16 AM    Patient seen  and examined. Agree with assessment and plan. I/O since admission -7905. Pt remains in A flutter; rate now 90 - 104 on 5 mg/h cardizem,  Will initiate low dose cardioselective bisoprolol 5 mg.  C02 increased from 33 to 46 suggestive of contraction alkalosis with brisk diuresis. Will dc iv lasix at 80 bid and hold today.  Will start diamox 250 mg q 6 hrs for 4 doses.   Troy Sine, MD, [facility] 11/13/2016 11:13 AM

## 2016-11-13 NOTE — Progress Notes (Signed)
Pt refuse NIV for the night. I told patient if change your mind to please let staff know to notify RT. Pt verbalize understanding no distress noted at this time.

## 2016-11-13 NOTE — Progress Notes (Signed)
ANTICOAGULATION CONSULT NOTE - [place] for Lovenox >>Coumadin Indication: atrial fibrillation  Allergies  Allergen Reactions  . Inderal [Propranolol] Other (See Comments)    Hair loss  . Ace Inhibitors Cough    Patient Measurements: Height: 5\' 5"  (165.1 cm) Weight: 251 lb 6.4 oz (114 kg) IBW/kg (Calculated) : 57  Vital Signs: Temp: 97.8 F (36.6 C) (02/28 0749) Temp Source: Oral (02/28 0749) BP: 103/49 (02/28 0749) Pulse Rate: 89 (02/28 0749)  Labs:  Recent Labs  11/11/16 1853 11/12/16 0018 11/12/16 0507 11/12/16 0929 11/13/16 0342  HGB 10.3*  --  9.9*  --   --   HCT 38.7  --  37.3  --   --   PLT 323  --  276  --   --   LABPROT 15.8*  --  16.0*  --  17.1*  INR 1.25  --  1.27  --  1.38  CREATININE 0.70  --  0.73  --  0.74  TROPONINI  --  <0.03 <0.03 <0.03  --     Estimated Creatinine Clearance: 82.4 mL/min (by C-G formula based on SCr of 0.74 mg/dL).   Medical History: Past Medical History:  Diagnosis Date  . Arthritis   . COPD (chronic obstructive pulmonary disease) ([place])   . Depression   . Diabetes mellitus type 2 in obese ([place])   . GERD (gastroesophageal reflux disease)   . Hyperlipidemia   . Hypertension   . Morbid obesity ([place])   . OAB (overactive bladder)   . PSVT (paroxysmal supraventricular tachycardia) ([place])   . Thyroid disease   . Vitamin D deficiency     Medications:  See electronic med rec Coumadin 5mg  daily except 2.5mg  on Sun and Wed  Assessment: 71 y.o. F presents after being sent by PCP for abnormal EKG. Pt on coumadin PTA for afib. INR slowly trending up to 1.38.  Asked to start Lovenox bridging until INR therapeutic.  Goal of Therapy:  INR 2-3 Monitor platelets by anticoagulation protocol: Yes   Plan:  Lovenox 120mg  SQ q12h Increase Coumadin 7.5mg  PO x 1 tonight Daily INR  [employer], Pharm.D., BCPS Clinical Pharmacist Pager [phone] 11/13/2016 8:45 AM

## 2016-11-13 NOTE — Progress Notes (Signed)
PROGRESS NOTE        PATIENT DETAILS Name: Jillian Wood Age: 71 y.o. Sex: female Date of Birth: 27-Oct-1945 Admit Date: 11/11/2016 Admitting Physician Rise Patience, MD [id number] DAVID, MD  Brief Narrative: Patient is a 71 y.o. female with history of chronic hypoxemic respiratory failure on 5 L of oxygen at home, COPD, atrial fibrillation on chronic anticoagulation referred to the ED by PCP for evaluation of tachycardia, found to have atrial fibrillation with RVR and decompensated diastolic heart failure. Admitted for further evaluation and treatment  Subjective: Less shortness of breath.  Assessment/Plan: Atrial fibrillation with [id number] off Cardizem infusion, continue Cardizem and beta blocker. Continue Coumadin and overlapping Lovenox-CHA2DVASC score of atleast 4.  Acute on chronic diastolic [id number], -7 L, weight decreased to 251 pounds (267 pounds on admission). Lasix stopped by cardiology, now on acetazolamide. Continue to follow weights, intake/output.  COPD with chronic hypoxemic respiratory failure on 5 L of oxygen at home: Appears stable-continue with current bronchodilator regimen  Hypertension: Controlled, continue losartan, Cardizem and metoprolol.  Type 2 diabetes: CBGs stable, continue SSI. Resume metformin on discharge.  Hypothyroidism: Continue with Synthroid  Dyslipidemia: Continue statin.  DVT Prophylaxis: Full dose anticoagulation with Coumadin  Code Status: Full code  Family Communication: None at bedside  Disposition Plan: Remain inpatient-requires a few more days of hospitalization prior to discharge.  Antimicrobial agents: Anti-infectives    None     Procedures: None  CONSULTS:  cardiology  Time spent: 25- minutes-Greater than 50% of this time was spent in counseling, explanation of diagnosis, planning of further management, and coordination of care.  MEDICATIONS: Scheduled Meds: .  acetaZOLAMIDE  250 mg Oral Q6H  . aspirin  81 mg Oral Daily  . bisoprolol  5 mg Oral Daily  . budesonide (PULMICORT) nebulizer solution  0.25 mg Nebulization BID  . diltiazem  300 mg Oral Daily  . DULoxetine  60 mg Oral Daily  . enoxaparin (LOVENOX) injection  120 mg Subcutaneous Q12H  . famotidine  20 mg Oral BID  . ferrous sulfate  325 mg Oral BID WC  . guaiFENesin  600 mg Oral Daily  . insulin aspart  0-9 Units Subcutaneous TID WC  . levalbuterol  0.63 mg Nebulization Q6H  . levothyroxine  175 mcg Oral QAC breakfast  . metoprolol tartrate  50 mg Oral BID  . potassium chloride  20 mEq Oral Daily  . pravastatin  40 mg Oral q1800  . warfarin  7.5 mg Oral ONCE-1800  . Warfarin - Pharmacist Dosing Inpatient   Does not apply q1800   Continuous Infusions: . diltiazem (CARDIZEM) infusion 5 mg/hr (11/13/16 1009)   PRN Meds:.acetaminophen **OR** acetaminophen, fluticasone, levalbuterol, ondansetron **OR** ondansetron (ZOFRAN) IV   PHYSICAL EXAM: Vital signs: Vitals:   11/13/16 0003 11/13/16 0521 11/13/16 0727 11/13/16 0749  BP: (!) 101/56 109/88  (!) 103/49  Pulse: 66 88  89  Resp: 20 20  20   Temp: 98.4 F (36.9 C) 97.7 F (36.5 C)  97.8 F (36.6 C)  TempSrc: Oral Oral  Oral  SpO2: 93% 92% 93% 90%  Weight:  114 kg (251 lb 6.4 oz)    Height:       Filed Weights   11/11/16 1938 11/12/16 0122 11/13/16 0521  Weight: 121.1 kg (267 lb) 120.5 kg (265 lb 9.6 oz) 114 kg (251 lb 6.4 oz)  Body mass index is 41.84 kg/m.   General appearance :Awake, alert, not in any distress. Speech Clear.  Eyes:, pupils equally reactive to light and accomodation,no scleral icterus. HEENT: Atraumatic and Normocephalic Neck: supple, no JVD. No cervical lymphadenopathy.  Resp:Good air entry bilaterally, no added sounds  CVS: S1 S2 regular, tachycardic GI: Bowel sounds present, Non tender and not distended with no gaurding, rigidity or rebound Extremities: B/L Lower Ext shows trace edema, both legs  are warm to touch Neurology:  speech clear,Non focal, sensation is grossly intact. Psychiatric: Normal judgment and insight. Alert and oriented x 3.  Musculoskeletal:No digital cyanosis Skin:No Rash, warm and dry Wounds:N/A  I have personally reviewed following labs and imaging studies  LABORATORY DATA: CBC:  Recent Labs Lab 11/11/16 1853 11/12/16 0507  WBC 9.0 8.0  NEUTROABS 6.7 5.4  HGB 10.3* 9.9*  HCT 38.7 37.3  MCV 83.0 82.2  PLT 323 [id number]    Basic Metabolic Panel:  Recent Labs Lab 11/11/16 1853 11/12/16 0018 11/12/16 0507 11/13/16 0342  NA 141  --  142 143  K 4.5  --  3.9 3.7  CL 100*  --  95* 91*  CO2 33*  --  37* 46*  GLUCOSE 119*  --  131* 140*  BUN 15  --  14 12  CREATININE 0.70  --  0.73 0.74  CALCIUM 8.9  --  9.1 9.1  MG  --  1.8  --   --     GFR: Estimated Creatinine Clearance: 82.4 mL/min (by C-G formula based on SCr of 0.74 mg/dL).  Liver Function Tests:  Recent Labs Lab 11/11/16 1853 11/12/16 0507  AST 18 17  ALT 15 15  ALKPHOS 73 71  BILITOT 0.6 0.8  PROT 6.4* 6.1*  ALBUMIN 3.4* 3.3*   No results for input(s): LIPASE, AMYLASE in the last 168 hours. No results for input(s): AMMONIA in the last 168 hours.  Coagulation Profile:  Recent Labs Lab 11/11/16 1853 11/12/16 0507 11/13/16 0342  INR 1.25 1.27 1.38    Cardiac Enzymes:  Recent Labs Lab 11/12/16 0018 11/12/16 0507 11/12/16 0929  TROPONINI <0.03 <0.03 <0.03    BNP (last 3 results) No results for input(s): PROBNP in the last 8760 hours.  HbA1C: No results for input(s): HGBA1C in the last 72 hours.  CBG:  Recent Labs Lab 11/12/16 1120 11/12/16 1645 11/12/16 2134 11/13/16 0748 11/13/16 1205  GLUCAP 151* 122* 119* 143* 116*    Lipid Profile: No results for input(s): CHOL, HDL, LDLCALC, TRIG, CHOLHDL, LDLDIRECT in the last 72 hours.  Thyroid Function Tests:  Recent Labs  11/12/16 0018  TSH 6.576*    Anemia Panel: No results for input(s):  VITAMINB12, FOLATE, FERRITIN, TIBC, IRON, RETICCTPCT in the last 72 hours.  Urine analysis:    Component Value Date/Time   COLORURINE YELLOW 07/27/2015 1612   APPEARANCEUR CLEAR 07/27/2015 1612   LABSPEC 1.017 07/27/2015 1612   PHURINE 6.0 07/27/2015 1612   GLUCOSEU NEGATIVE 07/27/2015 1612   HGBUR 2+ (A) 07/27/2015 1612   BILIRUBINUR NEGATIVE 07/27/2015 1612   KETONESUR NEGATIVE 07/27/2015 1612   PROTEINUR NEGATIVE 07/27/2015 1612   UROBILINOGEN 1.0 10/23/2009 1145   NITRITE NEGATIVE 07/27/2015 1612   LEUKOCYTESUR NEGATIVE 07/27/2015 1612    Sepsis Labs: Lactic Acid, Venous    Component Value Date/Time   LATICACIDVEN 1.36 11/11/2016 2214    MICROBIOLOGY: Recent Results (from the past 240 hour(s))  MRSA PCR Screening     Status: None   Collection Time:  11/12/16  1:50 AM  Result Value Ref Range Status   MRSA by PCR NEGATIVE NEGATIVE Final    Comment:        The GeneXpert MRSA Assay (FDA approved for NASAL specimens only), is one component of a comprehensive MRSA colonization surveillance program. It is not intended to diagnose MRSA infection nor to guide or monitor treatment for MRSA infections.     RADIOLOGY STUDIES/RESULTS: Dg Chest Portable 1 View  Result Date: 11/11/2016 CLINICAL DATA:  Abnormal EKG. EXAM: PORTABLE CHEST 1 VIEW COMPARISON:  07/31/2016 FINDINGS: 1857 hours. Low volume film. The cardio pericardial silhouette is enlarged. Pulmonary vascular congestion noted with probable interstitial pulmonary edema. There is bibasilar collapse/consolidation with small bilateral pleural effusions. The visualized bony structures of the thorax are intact. Telemetry leads overlie the chest. IMPRESSION: Cardiomegaly with interstitial edema, bibasilar collapse/ consolidation, and small effusions. Electronically Signed   By: Misty Stanley M.D.   On: 11/11/2016 19:09     LOS: 2 days   Oren Binet, MD  [facility] Pager:[phone]  If 7PM-7AM, please  contact night-coverage [email or web address] Password [email or web address] 11/13/2016, 1:14 PM

## 2016-11-13 NOTE — Consult Note (Signed)
   [facility] CM Inpatient Consult   11/13/2016  Jillian Wood 1946-05-07 [id number]   Patient screened for potential [place] Management services. Patient is eligible for [facility] Care Management services under patient's [employer] Medicare plan. Chart review reveals the patient is Jillian Wood is a 71 y.o. female with atrial fibrillation, COPD, hypertension, diabetes mellitus, chronic anemia, sleep apnea was referred to the ER by patient's primary care physician after patient was found to be tachycardic. Patient denies any chest pain palpitations. Patient was recently treated for COPD exacerbation on prednisone and antibiotics.   Met with the patient at the bedside, patient was resting with 02 on.  Spoke with the patient about [place] Management services with her [employer].  Patient states she has no difficulty getting to appointments, her son drives her.  She states [facility] and does not like the mail order pharmacy and not using them any longer.  She denies any problems at this time for needs of community care management.  She states she has follow up and agood rapport with her primary MD and his nurses.  She did accept the brochure, letter and 24 hour nurse line magnet with [facility] contact information.  For questions or changes please contact:   Natividad Brood, RN BSN [facility] Liaison  [phone] business mobile phone Toll free office [phone]

## 2016-11-13 NOTE — Plan of Care (Signed)
Problem: Safety: Goal: Ability to remain free from injury will improve Outcome: Progressing No falls or new signs of skin breakdown this shift. Fall risk bundle in place. Call light within reach, bed alarm on. Will continue to perform hourly rounding and maintain pt safety.

## 2016-11-14 LAB — BASIC METABOLIC PANEL
Anion gap: 6 (ref 5–15)
BUN: 12 mg/dL (ref 6–20)
CO2: 42 mmol/L — ABNORMAL HIGH (ref 22–32)
CREATININE: 0.73 mg/dL (ref 0.44–1.00)
Calcium: 9.1 mg/dL (ref 8.9–10.3)
Chloride: 94 mmol/L — ABNORMAL LOW (ref 101–111)
GFR calc non Af Amer: >60 mL/min (ref >60)
Glucose, Bld: 135 mg/dL — ABNORMAL HIGH (ref 65–99)
POTASSIUM: 4 mmol/L (ref 3.5–5.1)
Sodium: 142 mmol/L (ref 135–145)

## 2016-11-14 LAB — PROCALCITONIN

## 2016-11-14 LAB — GLUCOSE, CAPILLARY
GLUCOSE-CAPILLARY: 126 mg/dL — AB (ref 65–99)
GLUCOSE-CAPILLARY: 138 mg/dL — AB (ref 65–99)
GLUCOSE-CAPILLARY: 161 mg/dL — AB (ref 65–99)
GLUCOSE-CAPILLARY: 114 mg/dL — AB (ref 65–99)

## 2016-11-14 LAB — PROTIME-INR
INR: 1.4
Prothrombin Time: 17.2 seconds — ABNORMAL HIGH (ref 11.4–15.2)

## 2016-11-14 MED ORDER — HYDROCODONE-ACETAMINOPHEN 5-325 MG PO TABS
1.0000 | ORAL_TABLET | Freq: Once | ORAL | Status: AC
  Administered 2016-11-14: 2 via ORAL
  Filled 2016-11-14: qty 2

## 2016-11-14 MED ORDER — KETOROLAC TROMETHAMINE 15 MG/ML IJ SOLN
15.0000 mg | Freq: Once | INTRAMUSCULAR | Status: AC
  Administered 2016-11-14: 15 mg via INTRAVENOUS
  Filled 2016-11-14: qty 1

## 2016-11-14 MED ORDER — WARFARIN SODIUM 10 MG PO TABS
10.0000 mg | ORAL_TABLET | Freq: Once | ORAL | Status: AC
  Administered 2016-11-14: 10 mg via ORAL
  Filled 2016-11-14: qty 1

## 2016-11-14 MED ORDER — ACETAZOLAMIDE 250 MG PO TABS
250.0000 mg | ORAL_TABLET | Freq: Three times a day (TID) | ORAL | Status: AC
Start: 2016-11-14 — End: 2016-11-14
  Administered 2016-11-14 (×2): 250 mg via ORAL
  Filled 2016-11-14 (×2): qty 1

## 2016-11-14 NOTE — Progress Notes (Signed)
Scheduled neb not given. pt is sleeping comfortably at this time no distress noted. Pt 6L HFNC humidity was not on, on arrival. Humidity bottle placed on by RRT to avoid dry nasal cavity. PRN nebs will be given if needed for SOB/Wheezing. Pt is stable at this time normal breathing pattern and SATs are 97%.

## 2016-11-14 NOTE — Progress Notes (Signed)
ANTICOAGULATION CONSULT NOTE - [place] for Lovenox >>Coumadin Indication: atrial fibrillation  Allergies  Allergen Reactions  . Inderal [Propranolol] Other (See Comments)    Hair loss  . Ace Inhibitors Cough    Patient Measurements: Height: 5\' 5"  (165.1 cm) Weight: 246 lb 11.2 oz (111.9 kg) IBW/kg (Calculated) : 57  Vital Signs: Temp: 97.8 F (36.6 C) (03/01 0840) Temp Source: Oral (03/01 0840) BP: 105/56 (03/01 0840) Pulse Rate: 67 (03/01 0544)  Labs:  Recent Labs  11/11/16 1853 11/12/16 0018 11/12/16 0507 11/12/16 0929 11/13/16 0342 11/14/16 0443  HGB 10.3*  --  9.9*  --   --   --   HCT 38.7  --  37.3  --   --   --   PLT 323  --  276  --   --   --   LABPROT 15.8*  --  16.0*  --  17.1* 17.2*  INR 1.25  --  1.27  --  1.38 1.40  CREATININE 0.70  --  0.73  --  0.74 0.73  TROPONINI  --  <0.03 <0.03 <0.03  --   --     Estimated Creatinine Clearance: 81.6 mL/min (by C-G formula based on SCr of 0.73 mg/dL).   Medical History: Past Medical History:  Diagnosis Date  . Arthritis   . COPD (chronic obstructive pulmonary disease) ([place])   . Depression   . Diabetes mellitus type 2 in obese ([place])   . GERD (gastroesophageal reflux disease)   . Hyperlipidemia   . Hypertension   . Morbid obesity ([place])   . OAB (overactive bladder)   . PSVT (paroxysmal supraventricular tachycardia) ([place])   . Thyroid disease   . Vitamin D deficiency     Medications:  See electronic med rec Coumadin 5mg  daily except 2.5mg  on Sun and Wed  Assessment: 71 y.o. F presents after being sent by PCP for abnormal EKG. Pt on coumadin PTA for afib. INR slowly trending up to 1.4.  Continues on Lovenox bridging until INR therapeutic.  Goal of Therapy:  INR 2-3 Monitor platelets by anticoagulation protocol: Yes   Plan:  Lovenox 120mg  SQ q12h Coumadin 10 mg PO x 1 tonight Daily INR  [employer], Pharm.D., BCPS Clinical Pharmacist Pager [phone] 11/14/2016  11:06 AM

## 2016-11-14 NOTE — Plan of Care (Signed)
Problem: Pain Managment: Goal: General experience of comfort will improve Outcome: Progressing Pt complained of back moderate pain. Tylenol given per order and pt repositioned. Pt pain improving. Will continue to monitor.   Problem: Fluid Volume: Goal: Ability to maintain a balanced intake and output will improve Outcome: Progressing Pt has had 2400 ml output so far this shift. Pt states her breathing has improved since the diuresing began. Will continue to monitor pt.

## 2016-11-14 NOTE — Progress Notes (Signed)
Progress Note  Patient Name: Jillian Wood Date of Encounter: 11/14/2016  Primary Cardiologist: Dr Gwenlyn Found  Subjective   Less SOB than on admission  Inpatient Medications    Scheduled Meds: . aspirin  81 mg Oral Daily  . bisoprolol  10 mg Oral Daily  . budesonide (PULMICORT) nebulizer solution  0.25 mg Nebulization BID  . diltiazem  300 mg Oral Daily  . DULoxetine  60 mg Oral Daily  . enoxaparin (LOVENOX) injection  120 mg Subcutaneous Q12H  . famotidine  20 mg Oral BID  . ferrous sulfate  325 mg Oral BID WC  . guaiFENesin  600 mg Oral Daily  . insulin aspart  0-9 Units Subcutaneous TID WC  . levalbuterol  0.63 mg Nebulization Q6H  . levothyroxine  175 mcg Oral QAC breakfast  . potassium chloride  20 mEq Oral Daily  . pravastatin  40 mg Oral q1800  . warfarin  10 mg Oral ONCE-1800  . Warfarin - Pharmacist Dosing Inpatient   Does not apply q1800   Continuous Infusions: . diltiazem (CARDIZEM) infusion Stopped (11/14/16 1150)   PRN Meds: acetaminophen **OR** acetaminophen, fluticasone, levalbuterol, ondansetron **OR** ondansetron (ZOFRAN) IV   Vital Signs    Vitals:   11/14/16 0856 11/14/16 0857 11/14/16 1252 11/14/16 1339  BP:   105/69   Pulse:   64   Resp:      Temp:   98 F (36.7 C)   TempSrc:   Oral   SpO2: 92% 95% 95% 94%  Weight:      Height:        Intake/Output Summary (Last 24 hours) at 11/14/16 1512 Last data filed at 11/14/16 [id number]  Gross per 24 hour  Intake              220 ml  Output             5500 ml  Net            -5280 ml   Filed Weights   11/12/16 0122 11/13/16 0521 11/14/16 0544  Weight: 265 lb 9.6 oz (120.5 kg) 251 lb 6.4 oz (114 kg) 246 lb 11.2 oz (111.9 kg)    Telemetry    A-flutter with variable VR- 80-100 with PVCs - Personally Reviewed  ECG    A flutter with RBBB - Personally Reviewed  Physical Exam   GEN: Morbidly obese, on O2 no acute distress.   Neck: No JVD Cardiac: irreg irreg, no murmurs, rubs, or gallops.    Respiratory: Clear to auscultation bilaterally. GI: obese, soft, nontender, non-distended  MS: No edema; No deformity. Neuro:  Nonfocal  Psych: Normal affect   Labs    Chemistry  Recent Labs Lab 11/11/16 1853 11/12/16 0507 11/13/16 0342 11/14/16 0443  NA 141 142 143 142  K 4.5 3.9 3.7 4.0  CL 100* 95* 91* 94*  CO2 33* 37* 46* 42*  GLUCOSE 119* 131* 140* 135*  BUN 15 14 12 12   CREATININE 0.70 0.73 0.74 0.73  CALCIUM 8.9 9.1 9.1 9.1  PROT 6.4* 6.1*  --   --   ALBUMIN 3.4* 3.3*  --   --   AST 18 17  --   --   ALT 15 15  --   --   ALKPHOS 73 71  --   --   BILITOT 0.6 0.8  --   --   GFRNONAA >60 >60 >60 >60  GFRAA >60 >60 >60 >60  ANIONGAP 8 10 6  6  Hematology  Recent Labs Lab 11/11/16 1853 11/12/16 0507  WBC 9.0 8.0  RBC 4.66 4.54  HGB 10.3* 9.9*  HCT 38.7 37.3  MCV 83.0 82.2  MCH 22.1* 21.8*  MCHC 26.6* 26.5*  RDW 18.6* 18.5*  PLT 323 276    Cardiac Enzymes  Recent Labs Lab 11/12/16 0018 11/12/16 0507 11/12/16 0929  TROPONINI <0.03 <0.03 <0.03     Recent Labs Lab 11/11/16 1853  TROPIPOC 0.01     BNP  Recent Labs Lab 11/11/16 1853  BNP 352.4*    TSH- 6..57   Radiology    CXR 11/11/16 FINDINGS: 1857 hours. Low volume film. The cardio pericardial silhouette is enlarged. Pulmonary vascular congestion noted with probable interstitial pulmonary edema. There is bibasilar collapse/consolidation with small bilateral pleural effusions. The visualized bony structures of the thorax are intact. Telemetry leads overlie the chest.  IMPRESSION: Cardiomegaly with interstitial edema, bibasilar collapse/ consolidation, and small effusions.   Cardiac Studies   Echo Nov 2017 Study Conclusions  - Left ventricle: The cavity size was moderately reduced. Wall   thickness was increased in a pattern of moderate LVH. The   estimated ejection fraction was in the range of 65% to 70%. Wall   motion was normal; there were no regional wall  motion   abnormalities. - Aortic valve: Severely calcified annulus. Moderately thickened,   moderately calcified leaflets. There was mild stenosis. Valve   area (VTI): 1.05 cm^2. Valve area (Vmax): 1.09 cm^2. Valve area   (Vmean): 1.05 cm^2. - Mitral valve: Moderately calcified annulus. - Left atrium: The atrium was severely dilated. - Right atrium: The atrium was mildly dilated. - Pulmonary arteries: Systolic pressure was severely increased. PA   peak pressure: 70 mm Hg (S).   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  Assessment & Plan    Atrial flutter with RVR: currently on a Cardizem infusion. HR in 90s. Continue home meds of Cardizem 300mg  daily and Lopressor 25mg  BID.  Lopressor increased to 50mg  BID.  Wean Cardizem gtt as able. Her last echo in 07/2016 showed severe LAE. Low likelihood she will maintain NSR over time. Would persue rate control strategy at this time.  -- Continue Coumadin-CHA2DVASC score of at least 5. INR is subtherapeutic. Coumadin/Lovenox per pharmacy.  - Prior to admission she was on Diltiazem 300 mg and Lopressor. Now on Bisoprolol and Diltiazem 300 mg. HR now well controlled, her rate may have been up secondary to CHF.   Acute on chronic diastolic CHF: BNP mildly elevated but can be falsely low in the setting of morbid obesity. CXR with some pulmonary vascular congestion. Continue IV lasix 80mg  BID. I/O neg 13.1L since adm. Weight 267 on adm-down to 246.   COPD with chronic hypoxemic respiratory failure on 5 L of oxygen at home: Appears stable.  Hypertension:BP well controlled currently   Type 2 diabetes: continue SSI. Resume  metformin on discharge.  Hypothyroidism:TSH 6.5 (mildly elevated). Continue with Synthroid  Dyslipidemia: Continue statin.  OSA: continue CPAP. Question compliance at home  Plan:  She is no longer on diuretics.    Signed, Kerin Ransom, PA-C  11/14/2016, 3:12 PM  Patient seen and examined. Agree with assessment and plan. Feels much better today. I/O -[id number] since admission. She received 4 doses of diamox; CO2 46 yesterday, today 42; will give 2 additional doses. HR improved with the addition of bisoprolol. AFlutter rate now in the 60s. INR subtherapeutic, received warfarin at 7.5 mg yesterday.   Troy Sine, MD, [facility] 11/14/2016 4:38 PM

## 2016-11-14 NOTE — Plan of Care (Signed)
Problem: Pain Managment: Goal: General experience of comfort will improve Outcome: Progressing Chronic lower back pain relieved by Norco given.

## 2016-11-14 NOTE — Progress Notes (Signed)
PROGRESS NOTE        PATIENT DETAILS Name: Jillian Wood Age: 71 y.o. Sex: female Date of Birth: [birth year] Admit Date: 11/11/2016 Admitting Physician Rise Patience, MD [id number] DAVID, MD  Brief Narrative: Patient is a 71 y.o. female with history of chronic hypoxemic respiratory failure on 5 L of oxygen at home, COPD, atrial fibrillation on chronic anticoagulation referred to the ED by PCP for evaluation of tachycardia, found to have atrial fibrillation with RVR and decompensated diastolic heart failure. Admitted for further evaluation and treatment  Subjective: Feels much better-continues to be on low-dose IV Cardizem infusion  Assessment/Plan: Atrial fibrillation with [id number] off Cardizem infusion, continue oral Cardizem and beta blocker. Continue Coumadin and overlapping Lovenox-CHA2DVASC score of atleast 4.INR still subtherapeutic  Acute on chronic diastolic [id number], -13 L so far, weight decreased to 246 pounds (267 pounds on admission). Lasix stopped by cardiology, now on acetazolamide. Continue to follow weights, intake/output.Await further recommendations from cardiology.  COPD with chronic hypoxemic respiratory failure on 5 L of oxygen at home: Appears stable-continue with current bronchodilator regimen  Hypertension: Controlled, continue losartan, Cardizem and metoprolol.  Type 2 diabetes: CBGs stable, continue SSI. Resume metformin on discharge.  Hypothyroidism: Continue with Synthroid  Dyslipidemia: Continue statin.  DVT Prophylaxis: Full dose anticoagulation with Coumadin-overlapping Lovenox  Code Status: Full code  Family Communication: None at bedside  Disposition Plan: Remain inpatient- suspect home in the next few days.  Antimicrobial agents: Anti-infectives    None     Procedures: None  CONSULTS:  cardiology  Time spent: 25- minutes-Greater than 50% of this time was spent in counseling,  explanation of diagnosis, planning of further management, and coordination of care.  MEDICATIONS: Scheduled Meds: . acetaZOLAMIDE  250 mg Oral Q6H  . aspirin  81 mg Oral Daily  . bisoprolol  10 mg Oral Daily  . budesonide (PULMICORT) nebulizer solution  0.25 mg Nebulization BID  . diltiazem  300 mg Oral Daily  . DULoxetine  60 mg Oral Daily  . enoxaparin (LOVENOX) injection  120 mg Subcutaneous Q12H  . famotidine  20 mg Oral BID  . ferrous sulfate  325 mg Oral BID WC  . guaiFENesin  600 mg Oral Daily  . insulin aspart  0-9 Units Subcutaneous TID WC  . levalbuterol  0.63 mg Nebulization Q6H  . levothyroxine  175 mcg Oral QAC breakfast  . potassium chloride  20 mEq Oral Daily  . pravastatin  40 mg Oral q1800  . Warfarin - Pharmacist Dosing Inpatient   Does not apply q1800   Continuous Infusions: . diltiazem (CARDIZEM) infusion 5 mg/hr (11/14/16 0420)   PRN Meds:.acetaminophen **OR** acetaminophen, fluticasone, levalbuterol, ondansetron **OR** ondansetron (ZOFRAN) IV   PHYSICAL EXAM: Vital signs: Vitals:   11/14/16 0544 11/14/16 0840 11/14/16 0856 11/14/16 0857  BP: (!) 104/56 (!) 105/56    Pulse: 67     Resp: 18     Temp: 98 F (36.7 C) 97.8 F (36.6 C)    TempSrc: Oral Oral    SpO2: 96% 97% 92% 95%  Weight: 111.9 kg (246 lb 11.2 oz)     Height:       Filed Weights   11/12/16 0122 11/13/16 0521 11/14/16 0544  Weight: 120.5 kg (265 lb 9.6 oz) 114 kg (251 lb 6.4 oz) 111.9 kg (246 lb 11.2 oz)   Body mass  index is 41.05 kg/m.   General appearance :Awake, alert, not in any distress. Speech Clear.  Eyes:, pupils equally reactive to light and accomodation,no scleral icterus. HEENT: Atraumatic and Normocephalic Neck: supple, no JVD. No cervical lymphadenopathy.  Resp:Good air entry bilaterally, no added sounds  CVS: S1 S2 irregular, tachycardic GI: Bowel sounds present, Non tender and not distended with no gaurding, rigidity or rebound Extremities: B/L Lower Ext shows  trace edema, both legs are warm to touch Neurology:  speech clear,Non focal, sensation is grossly intact. Psychiatric: Normal judgment and insight. Alert and oriented x 3.  Musculoskeletal:No digital cyanosis Skin:No Rash, warm and dry Wounds:N/A  I have personally reviewed following labs and imaging studies  LABORATORY DATA: CBC:  Recent Labs Lab 11/11/16 1853 11/12/16 0507  WBC 9.0 8.0  NEUTROABS 6.7 5.4  HGB 10.3* 9.9*  HCT 38.7 37.3  MCV 83.0 82.2  PLT 323 [id number]    Basic Metabolic Panel:  Recent Labs Lab 11/11/16 1853 11/12/16 0018 11/12/16 0507 11/13/16 0342 11/14/16 0443  NA 141  --  142 143 142  K 4.5  --  3.9 3.7 4.0  CL 100*  --  95* 91* 94*  CO2 33*  --  37* 46* 42*  GLUCOSE 119*  --  131* 140* 135*  BUN 15  --  14 12 12   CREATININE 0.70  --  0.73 0.74 0.73  CALCIUM 8.9  --  9.1 9.1 9.1  MG  --  1.8  --   --   --     GFR: Estimated Creatinine Clearance: 81.6 mL/min (by C-G formula based on SCr of 0.73 mg/dL).  Liver Function Tests:  Recent Labs Lab 11/11/16 1853 11/12/16 0507  AST 18 17  ALT 15 15  ALKPHOS 73 71  BILITOT 0.6 0.8  PROT 6.4* 6.1*  ALBUMIN 3.4* 3.3*   No results for input(s): LIPASE, AMYLASE in the last 168 hours. No results for input(s): AMMONIA in the last 168 hours.  Coagulation Profile:  Recent Labs Lab 11/11/16 1853 11/12/16 0507 11/13/16 0342 11/14/16 0443  INR 1.25 1.27 1.38 1.40    Cardiac Enzymes:  Recent Labs Lab 11/12/16 0018 11/12/16 0507 11/12/16 0929  TROPONINI <0.03 <0.03 <0.03    BNP (last 3 results) No results for input(s): PROBNP in the last 8760 hours.  HbA1C: No results for input(s): HGBA1C in the last 72 hours.  CBG:  Recent Labs Lab 11/13/16 0748 11/13/16 1205 11/13/16 1609 11/13/16 2127 11/14/16 0754  GLUCAP 143* 116* 113* 163* 126*    Lipid Profile: No results for input(s): CHOL, HDL, LDLCALC, TRIG, CHOLHDL, LDLDIRECT in the last 72 hours.  Thyroid Function  Tests:  Recent Labs  11/12/16 0018  TSH 6.576*    Anemia Panel: No results for input(s): VITAMINB12, FOLATE, FERRITIN, TIBC, IRON, RETICCTPCT in the last 72 hours.  Urine analysis:    Component Value Date/Time   COLORURINE YELLOW 07/27/2015 1612   APPEARANCEUR CLEAR 07/27/2015 1612   LABSPEC 1.017 07/27/2015 1612   PHURINE 6.0 07/27/2015 1612   GLUCOSEU NEGATIVE 07/27/2015 1612   HGBUR 2+ (A) 07/27/2015 1612   BILIRUBINUR NEGATIVE 07/27/2015 1612   KETONESUR NEGATIVE 07/27/2015 1612   PROTEINUR NEGATIVE 07/27/2015 1612   UROBILINOGEN 1.0 10/23/2009 1145   NITRITE NEGATIVE 07/27/2015 1612   LEUKOCYTESUR NEGATIVE 07/27/2015 1612    Sepsis Labs: Lactic Acid, Venous    Component Value Date/Time   LATICACIDVEN 1.36 11/11/2016 2214    MICROBIOLOGY: Recent Results (from the past 240 hour(s))  MRSA PCR Screening     Status: None   Collection Time: 11/12/16  1:50 AM  Result Value Ref Range Status   MRSA by PCR NEGATIVE NEGATIVE Final    Comment:        The GeneXpert MRSA Assay (FDA approved for NASAL specimens only), is one component of a comprehensive MRSA colonization surveillance program. It is not intended to diagnose MRSA infection nor to guide or monitor treatment for MRSA infections.     RADIOLOGY STUDIES/RESULTS: Dg Chest Portable 1 View  Result Date: 11/11/2016 CLINICAL DATA:  Abnormal EKG. EXAM: PORTABLE CHEST 1 VIEW COMPARISON:  07/31/2016 FINDINGS: 1857 hours. Low volume film. The cardio pericardial silhouette is enlarged. Pulmonary vascular congestion noted with probable interstitial pulmonary edema. There is bibasilar collapse/consolidation with small bilateral pleural effusions. The visualized bony structures of the thorax are intact. Telemetry leads overlie the chest. IMPRESSION: Cardiomegaly with interstitial edema, bibasilar collapse/ consolidation, and small effusions. Electronically Signed   By: Misty Stanley M.D.   On: 11/11/2016 19:09     LOS:  3 days   Oren Binet, MD  [facility] Pager:[phone]  If 7PM-7AM, please contact night-coverage [email or web address] Password [email or web address] 11/14/2016, 10:45 AM

## 2016-11-15 DIAGNOSIS — J9621 Acute and chronic respiratory failure with hypoxia: Secondary | ICD-10-CM

## 2016-11-15 LAB — BASIC METABOLIC PANEL
Anion gap: 7 (ref 5–15)
BUN: 14 mg/dL (ref 6–20)
CALCIUM: 9.4 mg/dL (ref 8.9–10.3)
CO2: 42 mmol/L — ABNORMAL HIGH (ref 22–32)
CREATININE: 0.87 mg/dL (ref 0.44–1.00)
Chloride: 95 mmol/L — ABNORMAL LOW (ref 101–111)
Glucose, Bld: 142 mg/dL — ABNORMAL HIGH (ref 65–99)
Potassium: 3.7 mmol/L (ref 3.5–5.1)
SODIUM: 144 mmol/L (ref 135–145)

## 2016-11-15 LAB — PROTIME-INR
INR: 1.76
PROTHROMBIN TIME: 20.7 s — AB (ref 11.4–15.2)

## 2016-11-15 LAB — GLUCOSE, CAPILLARY
GLUCOSE-CAPILLARY: 122 mg/dL — AB (ref 65–99)
GLUCOSE-CAPILLARY: 124 mg/dL — AB (ref 65–99)

## 2016-11-15 MED ORDER — WARFARIN SODIUM 5 MG PO TABS
5.0000 mg | ORAL_TABLET | Freq: Once | ORAL | Status: DC
Start: 2016-11-15 — End: 2016-11-15

## 2016-11-15 MED ORDER — BISOPROLOL FUMARATE 10 MG PO TABS: 10.0000 mg | ORAL_TABLET | Freq: Every day | ORAL | 0 refills | Status: DC

## 2016-11-15 NOTE — Discharge Summary (Signed)
PATIENT DETAILS Name: Jillian Wood Age: 71 y.o. Sex: female Date of Birth: April 12, 1946 MRN: [id number]. Admitting Physician: Rise Patience, MD [id number] DAVID, MD  Admit Date: 11/11/2016 Discharge date: 11/15/2016  Recommendations for Outpatient Follow-up:  1. Follow up with PCP for INR check on 3/5  2. Please obtain BMP/CBC in one week 3. Please ensure follow-up with cardiology   Admitted From:  Home  Disposition: [place]: No  Equipment/Devices: None  Discharge Condition: Stable  CODE STATUS: FULL CODE  Diet recommendation:  Heart Healthy / Carb Modified   Brief Summary: Patient is a 71 y.o. female with history of chronic hypoxemic respiratory failure on 5 L of oxygen at home, COPD, atrial fibrillation on chronic anticoagulation referred to the ED by PCP for evaluation of tachycardia, found to have atrial fibrillation with RVR and decompensated diastolic heart failure. Admitted for further evaluation and treatment  Brief Hospital Course: Atrial fibrillation with [id number] off Cardizem infusion, rate now controlled on oral Cardizem and bisoprolol. Continue Coumadin-INR slowly increasing, up to 1.76 on discharge. Instructed patient to follow-up with PCP on 3/5 for INR check, to resume usual dosing of Coumadin on discharge.CHA2DVASC score of atleast 4.  Acute on chronic diastolic [id number], -15 L so far, weight decreased to 247 pounds (267 pounds on admission). Diuretics managed by the cardiology service while inpatient, spoke with Dr. Claiborne Billings, recommendations are to resume usual dosing of Lasix and Diamox, recommendations were to stop Zaroxolyn. Patient has been counseled regarding importance of dietary compliance, compliance to medications and to check her weight daily.   COPD with chronic hypoxemic respiratory failure on 5 L of oxygen at home: Appears stable-continue with current bronchodilator regimen. Consider outpatient follow-up  with pulmonology.  Hypertension: Controlled, continue losartan, Cardizem and metoprolol.  Type 2 diabetes: CBGs stable, managed with SSI while inpatient. Resume metformin on discharge.  Hypothyroidism: Continue with Synthroid  Dyslipidemia: Continue statin.  Procedures/Studies: None  Discharge Diagnoses:  Principal Problem:   Atrial fibrillation with rapid ventricular response (HCC) Active Problems:   Essential hypertension   Chronic obstructive pulmonary disease (HCC)   Acute on chronic diastolic CHF (congestive heart failure) (HCC)   Acute on chronic respiratory failure with hypoxia (HCC)   Dilated cardiomyopathy (HCC)   CHF (congestive heart failure) (HCC)   Typical atrial flutter ([place])   Discharge Instructions:  Activity:  As tolerated with Full fall precautions use walker/cane & assistance as needed   Discharge Instructions    Call MD for:  difficulty breathing, headache or visual disturbances    Complete by:  As directed    Diet - low sodium heart healthy    Complete by:  As directed    Diet Carb Modified    Complete by:  As directed    Discharge instructions    Complete by:  As directed    Follow with Primary MD  Alesia Richards, MD for post hospital discharge visit, and also to check your INR on 3/5.  Please get a complete blood count and chemistry panel checked by your Primary MD at your next visit, and again as instructed by your Primary MD.  You have Congestive Heart Failure: Please call your Cardiologist or Primary MD-Anytime you have any of the following symptoms:   1) 3 pound weight gain in 24 hours or 5 pounds in 1 week  2) shortness of breath, with or without a dry hacking cough 3) swelling in the hands, feet or stomach 4) if you have to  sleep on extra pillows at night in order to breathe  Follow cardiac low salt diet and 1.5 lit/day fluid restriction.   Get Medicines reviewed and adjusted: Please take all your medications with you  for your next visit with your Primary MD  Laboratory/radiological data: Please request your Primary MD to go over all hospital tests and procedure/radiological results at the follow up, please ask your Primary MD to get all Hospital records sent to his/her office.  In some cases, they will be blood work, cultures and biopsy results pending at the time of your discharge. Please request that your primary care M.D. follows up on these results.  Also Note the following: If you experience worsening of your admission symptoms, develop shortness of breath, life threatening emergency, suicidal or homicidal thoughts you must seek medical attention immediately by calling 911 or calling your MD immediately  if symptoms less severe.  You must read complete instructions/literature along with all the possible adverse reactions/side effects for all the Medicines you take and that have been prescribed to you. Take any new Medicines after you have completely understood and accpet all the possible adverse reactions/side effects.   Do not drive when taking Pain medications or sleeping medications (Benzodaizepines)  Do not take more than prescribed Pain, Sleep and Anxiety Medications. It is not advisable to combine anxiety,sleep and pain medications without talking with your primary care practitioner  Special Instructions: If you have smoked or chewed Tobacco  in the last 2 yrs please stop smoking, stop any regular Alcohol  and or any Recreational drug use.  Wear Seat belts while driving.  Please note: You were cared for by a hospitalist during your hospital stay. Once you are discharged, your primary care physician will handle any further medical issues. Please note that NO REFILLS for any discharge medications will be authorized once you are discharged, as it is imperative that you return to your primary care physician (or establish a relationship with a primary care physician if you do not have one) for your  post hospital discharge needs so that they can reassess your need for medications and monitor your lab values.   Increase activity slowly    Complete by:  As directed      Allergies as of 11/15/2016      Reactions   Inderal [propranolol] Other (See Comments)   Hair loss   Ace Inhibitors Cough      Medication List    STOP taking these medications   metolazone 5 MG tablet Commonly known as:  ZAROXOLYN   metoprolol tartrate 25 MG tablet Commonly known as:  LOPRESSOR   predniSONE 20 MG tablet Commonly known as:  DELTASONE     TAKE these medications   acetaminophen-codeine 300-30 MG tablet Commonly known as:  TYLENOL #3 Take 1 tablet by mouth every 8 (eight) hours as needed for moderate pain or severe pain.   acetaZOLAMIDE 125 MG tablet Commonly known as:  DIAMOX Take 1 tablet 3 x/ day What changed:  how much to take  how to take this  when to take this  additional instructions   albuterol 108 (90 Base) MCG/ACT inhaler Commonly known as:  PROVENTIL HFA;VENTOLIN HFA Inhale 1-2 puffs into the lungs every 6 (six) hours as needed for wheezing or shortness of breath.   aspirin 81 MG tablet Take 81 mg by mouth daily.   bisoprolol 10 MG tablet Commonly known as:  ZEBETA Take 1 tablet (10 mg total) by mouth daily. Start taking  on:  11/16/2016   budesonide-formoterol 160-4.5 MCG/ACT inhaler Commonly known as:  SYMBICORT 2 puffs twice a day, wash mouth afterwards What changed:  how much to take  how to take this  when to take this  additional instructions   diltiazem 300 MG 24 hr capsule Commonly known as:  CARDIZEM CD Take 1 capsule (300 mg total) by mouth daily.   DULoxetine 60 MG capsule Commonly known as:  CYMBALTA TAKE 1 CAPSULE BY MOUTH DAILY   famotidine 20 MG tablet Commonly known as:  PEPCID TAKE 1 TABLET (20 MG TOTAL) BY MOUTH 2 (TWO) TIMES DAILY. FOR ACID REFLUX   ferrous sulfate 325 (65 FE) MG tablet Take 325 mg by mouth 2 (two) times daily  with a meal.   fluticasone 50 MCG/ACT nasal spray Commonly known as:  FLONASE Place 2 sprays into both nostrils daily. What changed:  when to take this  reasons to take this   furosemide 80 MG tablet Commonly known as:  LASIX Take 1 tablet (80 mg total) by mouth daily. What changed:  how much to take  when to take this  reasons to take this   guaiFENesin 600 MG 12 hr tablet Commonly known as:  MUCINEX Take 600 mg by mouth daily.   levothyroxine 175 MCG tablet Commonly known as:  SYNTHROID, LEVOTHROID TAKE 1 TABLET (175 MCG TOTAL) BY MOUTH DAILY BEFORE BREAKFAST.   loratadine-pseudoephedrine 10-240 MG 24 hr tablet Commonly known as:  CLARITIN-D 24-hour Take 1 tablet by mouth as needed for allergies.   losartan 50 MG tablet Commonly known as:  COZAAR TAKE 1 TABLET BY MOUTH DAILY   MAGNESIUM DR PO Take 1 capsule by mouth daily.   metFORMIN 500 MG 24 hr tablet Commonly known as:  GLUCOPHAGE-XR TAKE 1 TABLET (500 MG TOTAL) BY MOUTH 4 (FOUR) TIMES DAILY - AFTER MEALS AND AT BEDTIME.   OXYGEN Inhale 5 L into the lungs.   pravastatin 40 MG tablet Commonly known as:  PRAVACHOL TAKE 1 TABLET (40 MG TOTAL) BY MOUTH DAILY.   VITAMIN D PO Take 2,000 Units by mouth daily.   warfarin 5 MG tablet Commonly known as:  COUMADIN TAKE 1 TABLET (5 MG TOTAL) BY MOUTH ONE TIME ONLY AT 6 PM. What changed:  when to take this  additional instructions      Follow-up Information    Quay Burow, MD Follow up on 12/06/2016.   Specialties:  Cardiology, Radiology Why:  1:30 pm Contact information: [address] [place] [address] [phone]        Alesia Richards, MD. Schedule an appointment as soon as possible for a visit in 1 week(s).   Specialty:  Internal Medicine Contact information: [address] [place] [address] [phone]          Allergies  Allergen Reactions  . Inderal [Propranolol] Other (See  Comments)    Hair loss  . Ace Inhibitors Cough    Consultations:   cardiology   Other Procedures/Studies: Dg Chest Portable 1 View  Result Date: 11/11/2016 CLINICAL DATA:  Abnormal EKG. EXAM: PORTABLE CHEST 1 VIEW COMPARISON:  07/31/2016 FINDINGS: 1857 hours. Low volume film. The cardio pericardial silhouette is enlarged. Pulmonary vascular congestion noted with probable interstitial pulmonary edema. There is bibasilar collapse/consolidation with small bilateral pleural effusions. The visualized bony structures of the thorax are intact. Telemetry leads overlie the chest. IMPRESSION: Cardiomegaly with interstitial edema, bibasilar collapse/ consolidation, and small effusions. Electronically Signed   By: Randall Hiss  Tery Sanfilippo M.D.   On: 11/11/2016 19:09      TODAY-DAY OF DISCHARGE:  Subjective:   Jillian Wood today has no headache,no chest abdominal pain,no new weakness tingling or numbness, feels much better wants to go home today.   Objective:   Blood pressure (!) 87/59, pulse 91, temperature 98.1 F (36.7 C), temperature source Oral, resp. rate 17, height 5\' 5"  (1.651 m), weight 112.2 kg (247 lb 6.4 oz), SpO2 93 %.  Intake/Output Summary (Last 24 hours) at 11/15/16 1337 Last data filed at 11/15/16 1251  Gross per 24 hour  Intake              360 ml  Output             2250 ml  Net            -1890 ml   Filed Weights   11/13/16 0521 11/14/16 0544 11/15/16 0320  Weight: 114 kg (251 lb 6.4 oz) 111.9 kg (246 lb 11.2 oz) 112.2 kg (247 lb 6.4 oz)    Exam: Awake Alert, Oriented *3, No new F.N deficits, Normal affect .AT,PERRAL Supple Neck,No JVD, No cervical lymphadenopathy appriciated.  Symmetrical Chest wall movement, Good air movement bilaterally, CTAB RRR,No Gallops,Rubs or new Murmurs, No Parasternal Heave +ve B.Sounds, Abd Soft, Non tender, No organomegaly appriciated, No rebound -guarding or rigidity. No Cyanosis, Clubbing or edema, No new Rash or bruise   PERTINENT  RADIOLOGIC STUDIES: Dg Chest Portable 1 View  Result Date: 11/11/2016 CLINICAL DATA:  Abnormal EKG. EXAM: PORTABLE CHEST 1 VIEW COMPARISON:  07/31/2016 FINDINGS: 1857 hours. Low volume film. The cardio pericardial silhouette is enlarged. Pulmonary vascular congestion noted with probable interstitial pulmonary edema. There is bibasilar collapse/consolidation with small bilateral pleural effusions. The visualized bony structures of the thorax are intact. Telemetry leads overlie the chest. IMPRESSION: Cardiomegaly with interstitial edema, bibasilar collapse/ consolidation, and small effusions. Electronically Signed   By: Misty Stanley M.D.   On: 11/11/2016 19:09     PERTINENT LAB RESULTS: CBC: No results for input(s): WBC, HGB, HCT, PLT in the last 72 hours. CMET CMP     Component Value Date/Time   NA 144 11/15/2016 0420   K 3.7 11/15/2016 0420   CL 95 (L) 11/15/2016 0420   CO2 42 (H) 11/15/2016 0420   GLUCOSE 142 (H) 11/15/2016 0420   BUN 14 11/15/2016 0420   CREATININE 0.87 11/15/2016 0420   CREATININE 0.81 09/23/2016 1701   CALCIUM 9.4 11/15/2016 0420   PROT 6.1 (L) 11/12/2016 0507   ALBUMIN 3.3 (L) 11/12/2016 0507   AST 17 11/12/2016 0507   ALT 15 11/12/2016 0507   ALKPHOS 71 11/12/2016 0507   BILITOT 0.8 11/12/2016 0507   GFRNONAA >60 11/15/2016 0420   GFRNONAA 74 09/23/2016 1701   GFRAA >60 11/15/2016 0420   GFRAA 85 09/23/2016 1701    GFR Estimated Creatinine Clearance: 75.1 mL/min (by C-G formula based on SCr of 0.87 mg/dL). No results for input(s): LIPASE, AMYLASE in the last 72 hours. No results for input(s): CKTOTAL, CKMB, CKMBINDEX, TROPONINI in the last 72 hours. Invalid input(s): POCBNP No results for input(s): DDIMER in the last 72 hours. No results for input(s): HGBA1C in the last 72 hours. No results for input(s): CHOL, HDL, LDLCALC, TRIG, CHOLHDL, LDLDIRECT in the last 72 hours. No results for input(s): TSH, T4TOTAL, T3FREE, THYROIDAB in the last 72  hours.  Invalid input(s): FREET3 No results for input(s): VITAMINB12, FOLATE, FERRITIN, TIBC, IRON, RETICCTPCT in the last 72  hours. Coags:  Recent Labs  11/14/16 0443 11/15/16 0420  INR 1.40 1.76   Microbiology: Recent Results (from the past 240 hour(s))  MRSA PCR Screening     Status: None   Collection Time: 11/12/16  1:50 AM  Result Value Ref Range Status   MRSA by PCR NEGATIVE NEGATIVE Final    Comment:        The GeneXpert MRSA Assay (FDA approved for NASAL specimens only), is one component of a comprehensive MRSA colonization surveillance program. It is not intended to diagnose MRSA infection nor to guide or monitor treatment for MRSA infections.     FURTHER DISCHARGE INSTRUCTIONS:  Get Medicines reviewed and adjusted: Please take all your medications with you for your next visit with your Primary MD  Laboratory/radiological data: Please request your Primary MD to go over all hospital tests and procedure/radiological results at the follow up, please ask your Primary MD to get all Hospital records sent to his/her office.  In some cases, they will be blood work, cultures and biopsy results pending at the time of your discharge. Please request that your primary care M.D. goes through all the records of your hospital data and follows up on these results.  Also Note the following: If you experience worsening of your admission symptoms, develop shortness of breath, life threatening emergency, suicidal or homicidal thoughts you must seek medical attention immediately by calling 911 or calling your MD immediately  if symptoms less severe.  You must read complete instructions/literature along with all the possible adverse reactions/side effects for all the Medicines you take and that have been prescribed to you. Take any new Medicines after you have completely understood and accpet all the possible adverse reactions/side effects.   Do not drive when taking Pain medications  or sleeping medications (Benzodaizepines)  Do not take more than prescribed Pain, Sleep and Anxiety Medications. It is not advisable to combine anxiety,sleep and pain medications without talking with your primary care practitioner  Special Instructions: If you have smoked or chewed Tobacco  in the last 2 yrs please stop smoking, stop any regular Alcohol  and or any Recreational drug use.  Wear Seat belts while driving.  Please note: You were cared for by a hospitalist during your hospital stay. Once you are discharged, your primary care physician will handle any further medical issues. Please note that NO REFILLS for any discharge medications will be authorized once you are discharged, as it is imperative that you return to your primary care physician (or establish a relationship with a primary care physician if you do not have one) for your post hospital discharge needs so that they can reassess your need for medications and monitor your lab values.  Total Time spent coordinating discharge including counseling, education and face to face time equals  45 minutes.  SignedOren Binet 11/15/2016 1:37 PM

## 2016-11-15 NOTE — Discharge Instructions (Signed)

## 2016-11-15 NOTE — Care Management Important Message (Signed)
Important Message  Patient Details  Name: Jillian Wood MRN: [id number] Date of Birth: 09-Aug-1946   Medicare Important Message Given:  Yes    [name] Abena 11/15/2016, 1:10 PM

## 2016-11-15 NOTE — Progress Notes (Addendum)
Progress Note  Patient Name: Jillian Wood Date of Encounter: 11/15/2016  Primary Cardiologist: Dr Gwenlyn Found  Subjective   Less SOB than on admission  Inpatient Medications    Scheduled Meds: . aspirin  81 mg Oral Daily  . bisoprolol  10 mg Oral Daily  . budesonide (PULMICORT) nebulizer solution  0.25 mg Nebulization BID  . diltiazem  300 mg Oral Daily  . DULoxetine  60 mg Oral Daily  . enoxaparin (LOVENOX) injection  120 mg Subcutaneous Q12H  . famotidine  20 mg Oral BID  . ferrous sulfate  325 mg Oral BID WC  . guaiFENesin  600 mg Oral Daily  . insulin aspart  0-9 Units Subcutaneous TID WC  . levalbuterol  0.63 mg Nebulization Q6H  . levothyroxine  175 mcg Oral QAC breakfast  . potassium chloride  20 mEq Oral Daily  . pravastatin  40 mg Oral q1800  . Warfarin - Pharmacist Dosing Inpatient   Does not apply q1800   Continuous Infusions: . diltiazem (CARDIZEM) infusion Stopped (11/14/16 1150)   PRN Meds: acetaminophen **OR** acetaminophen, fluticasone, levalbuterol, ondansetron **OR** ondansetron (ZOFRAN) IV   Vital Signs    Vitals:   11/15/16 0320 11/15/16 0740 11/15/16 0812 11/15/16 0856  BP: 99/74   (!) 85/53  Pulse: 91     Resp:      Temp: 97.7 F (36.5 C)   98.2 F (36.8 C)  TempSrc: Oral   Oral  SpO2: 94% 99% 95% 94%  Weight: 247 lb 6.4 oz (112.2 kg)     Height:        Intake/Output Summary (Last 24 hours) at 11/15/16 0951 Last data filed at 11/15/16 0900  Gross per 24 hour  Intake              360 ml  Output             1750 ml  Net            -1390 ml   Filed Weights   11/13/16 0521 11/14/16 0544 11/15/16 0320  Weight: 251 lb 6.4 oz (114 kg) 246 lb 11.2 oz (111.9 kg) 247 lb 6.4 oz (112.2 kg)    Telemetry    A-flutter with variable VR- 80-100 with PVCs - Personally Reviewed  ECG    A flutter with RBBB - Personally Reviewed  Physical Exam   GEN: Morbidly obese, on O2 no acute distress.   Neck: No JVD Cardiac: irreg irreg, no murmurs,  rubs, or gallops.  Respiratory: Clear to auscultation bilaterally. GI: obese, soft, nontender, non-distended  MS: No edema; No deformity. Neuro:  Nonfocal  Psych: Normal affect   Labs    Chemistry  Recent Labs Lab 11/11/16 1853 11/12/16 0507 11/13/16 0342 11/14/16 0443 11/15/16 0420  NA 141 142 143 142 144  K 4.5 3.9 3.7 4.0 3.7  CL 100* 95* 91* 94* 95*  CO2 33* 37* 46* 42* 42*  GLUCOSE 119* 131* 140* 135* 142*  BUN 15 14 12 12 14   CREATININE 0.70 0.73 0.74 0.73 0.87  CALCIUM 8.9 9.1 9.1 9.1 9.4  PROT 6.4* 6.1*  --   --   --   ALBUMIN 3.4* 3.3*  --   --   --   AST 18 17  --   --   --   ALT 15 15  --   --   --   ALKPHOS 73 71  --   --   --   BILITOT 0.6  0.8  --   --   --   GFRNONAA >60 >60 >60 >60 >60  GFRAA >60 >60 >60 >60 >60  ANIONGAP 8 10 6 6 7      Hematology  Recent Labs Lab 11/11/16 1853 11/12/16 0507  WBC 9.0 8.0  RBC 4.66 4.54  HGB 10.3* 9.9*  HCT 38.7 37.3  MCV 83.0 82.2  MCH 22.1* 21.8*  MCHC 26.6* 26.5*  RDW 18.6* 18.5*  PLT 323 276    Cardiac Enzymes  Recent Labs Lab 11/12/16 0018 11/12/16 0507 11/12/16 0929  TROPONINI <0.03 <0.03 <0.03     Recent Labs Lab 11/11/16 1853  TROPIPOC 0.01     BNP  Recent Labs Lab 11/11/16 1853  BNP 352.4*    TSH- 6..57  INR 1.76  Radiology    CXR 11/11/16 FINDINGS: 1857 hours. Low volume film. The cardio pericardial silhouette is enlarged. Pulmonary vascular congestion noted with probable interstitial pulmonary edema. There is bibasilar collapse/consolidation with small bilateral pleural effusions. The visualized bony structures of the thorax are intact. Telemetry leads overlie the chest.  IMPRESSION: Cardiomegaly with interstitial edema, bibasilar collapse/ consolidation, and small effusions.   Cardiac Studies   Echo Nov 2017 Study Conclusions  - Left ventricle: The cavity size was moderately reduced. Wall   thickness was increased in a pattern of moderate LVH. The    estimated ejection fraction was in the range of 65% to 70%. Wall   motion was normal; there were no regional wall motion   abnormalities. - Aortic valve: Severely calcified annulus. Moderately thickened,   moderately calcified leaflets. There was mild stenosis. Valve   area (VTI): 1.05 cm^2. Valve area (Vmax): 1.09 cm^2. Valve area   (Vmean): 1.05 cm^2. - Mitral valve: Moderately calcified annulus. - Left atrium: The atrium was severely dilated. - Right atrium: The atrium was mildly dilated. - Pulmonary arteries: Systolic pressure was severely increased. PA   peak pressure: 70 mm Hg (S).   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  Assessment & Plan    Atrial flutter with RVR: currently on a Cardizem infusion. HR in 90-110. Continue home meds of Cardizem 300mg  daily. Lopressor changed to Bisoprolol.  Her last echo in 07/2016 showed severe LAE. Low likelihood she will maintain NSR over time. Would persue rate control strategy at this time.  -- Continue Coumadin-CHA2DVASC score of at least 5. INR is subtherapeutic. Lovenox/Coumadin per pharmacy.  - Prior to admission she was on Diltiazem 300 mg and Lopressor. Now on Bisoprolol 10 mg and Diltiazem 300 mg and Bisoprolol 10 mg. HR now better controlled, her elevated rate on admission may have been up secondary to CHF.   Acute on chronic diastolic CHF: BNP mildly elevated but can be falsely low in the setting of morbid obesity. CXR with some pulmonary vascular congestion. Continue IV lasix 80mg  BID. I/O neg 14.5.L since adm. Weight 267 on adm-down to 247.   COPD with chronic hypoxemic respiratory failure on 5 L of oxygen  at home: Appears stable.  Morbid obesity: BMI 44  Hypertension:BP currently low- will not change her medications for now, may be secondary to diuresis. Home  dose Cozaar not resumed this adm and will leave off for now.    Type 2 diabetes: continue SSI. Resume metformin on discharge.  Hypothyroidism:TSH 6.5 (mildly elevated). Continue with Synthroid  Dyslipidemia: Continue statin.  OSA: continue CPAP. Question compliance at home  Plan:  For discharge today. She is no longer on diuretics, ? Home dose diuretics.  Dr Melford Aase follows her INR- check next week.   F/U with Dr Gwenlyn Found scheduled for 3/23.    Angelena Form, PA-C  11/15/2016, 9:51 AM     Patient seen and examined. Agree with assessment and plan. Feels much  better. I/O -15075 since admission. INR 1.76, not yet therapeutic, received 10 mg warfarin last night. Planned d/c later today with f/u with Dr. Gwenlyn Found. Will need monitoring of INR early next week.   Troy Sine, MD, [facility] 11/15/2016 1:24 PM

## 2016-11-15 NOTE — Care Management Note (Signed)
Case Management Note  Patient Details  Name: LAYCIE HAUSE MRN: ID:[id number] Date of Birth: [birth year]  Subjective/Objective: Pt presented for Atrial Fib. Pt is from home with family support. Plan will be to return home. No home needs identified at this time. Pt has 02 at 5L at home. Family to bring 02 for travel home.                    Action/Plan: No further home needs identified at this time.   Expected Discharge Date:                  Expected Discharge Plan:  Home/Self Care  In-House Referral:  NA  Discharge planning Services  CM Consult  Post Acute Care Choice:  NA Choice offered to:  NA  DME Arranged:  N/A DME Agency:  NA  HH Arranged:  NA HH Agency:  NA  Status of Service:  Completed, signed off  If discussed at [place] of Stay Meetings, dates discussed:    Additional Comments:  Bethena Roys, RN 11/15/2016, 10:52 AM

## 2016-11-15 NOTE — Progress Notes (Signed)
ANTICOAGULATION CONSULT NOTE - Follow Up Consult  Pharmacy Consult for Coumadin with Lovenox bridge Indication: atrial fibrillation  Allergies  Allergen Reactions  . Inderal [Propranolol] Other (See Comments)    Hair loss  . Ace Inhibitors Cough    Patient Measurements: Height: 5\' 5"  (165.1 cm) Weight: 247 lb 6.4 oz (112.2 kg) IBW/kg (Calculated) : 57  Vital Signs: Temp: 98.2 F (36.8 C) (03/02 0856) Temp Source: Oral (03/02 0856) BP: 143/117 (03/02 1008) Pulse Rate: 91 (03/02 0320)  Labs:  Recent Labs  11/13/16 0342 11/14/16 0443 11/15/16 0420  LABPROT 17.1* 17.2* 20.7*  INR 1.38 1.40 1.76  CREATININE 0.74 0.73 0.87    Estimated Creatinine Clearance: 75.1 mL/min (by C-G formula based on SCr of 0.87 mg/dL).  Assessment:   Continues on Coumadin for afib.  INR only 1.25 on admit 2/26, but up to 1.76 today after increased doses while inpatient.  Coumadin 10 mg given on 3/1.    Patient reports that she may have missed some Coumadin doses prior to admission, and that Dr. Melford Aase had decreased her dose to 2.5 mg for a few days while on antibiotics recently.     Home Coumadin regimen:  5 mg daily except 2.5 mg on Wednesdays and Sundays.  Goal of Therapy:  INR 2-3 Anti-Xa level 0.6-1 units/ml 4hrs after LMWH dose given Monitor platelets by anticoagulation protocol: Yes   Plan:   Coumadin 5 mg today. Usual Friday dose.  Lovenox 120 mg sq q12hrs.  Stop when INR >2 or at discharge.  Daily PT/INR while inpatient.  Patient is expecting discharge later today and f/u INR at Dr. Idell Pickles office on Monday 11/18/16.  Would resume home regimen at discharge.  Arty Baumgartner. H. Rivera Colon Pager: [phone] 11/15/2016,11:25 AM

## 2016-11-15 NOTE — Progress Notes (Signed)
Pt refuse NIV. Pt has refuse NIV for the past few nights. NIV removed from room.

## 2016-11-15 NOTE — Progress Notes (Signed)
Pt refuse neb

## 2016-11-16 DIAGNOSIS — J449 Chronic obstructive pulmonary disease, unspecified: Secondary | ICD-10-CM | POA: Diagnosis not present

## 2016-11-18 ENCOUNTER — Telehealth: Payer: Self-pay | Admitting: *Deleted

## 2016-11-18 NOTE — Telephone Encounter (Signed)
Jillian Wood called scheduled [facility] for a follow up hosp appointment patient scheduled for tomorrow 11/19/16  -sb

## 2016-11-19 ENCOUNTER — Other Ambulatory Visit: Payer: Self-pay | Admitting: Physician Assistant

## 2016-11-19 ENCOUNTER — Ambulatory Visit (INDEPENDENT_AMBULATORY_CARE_PROVIDER_SITE_OTHER): Payer: PPO | Admitting: Physician Assistant

## 2016-11-19 ENCOUNTER — Ambulatory Visit: Payer: Self-pay | Admitting: Physician Assistant

## 2016-11-19 ENCOUNTER — Encounter: Payer: Self-pay | Admitting: Physician Assistant

## 2016-11-19 VITALS — BP 118/78 | HR 82 | Temp 97.3°F | Resp 16 | Ht 65.0 in | Wt 246.0 lb

## 2016-11-19 DIAGNOSIS — J449 Chronic obstructive pulmonary disease, unspecified: Secondary | ICD-10-CM | POA: Diagnosis not present

## 2016-11-19 DIAGNOSIS — N183 Chronic kidney disease, stage 3 (moderate): Secondary | ICD-10-CM | POA: Diagnosis not present

## 2016-11-19 DIAGNOSIS — Z9981 Dependence on supplemental oxygen: Secondary | ICD-10-CM

## 2016-11-19 DIAGNOSIS — I5032 Chronic diastolic (congestive) heart failure: Secondary | ICD-10-CM | POA: Diagnosis not present

## 2016-11-19 DIAGNOSIS — I4891 Unspecified atrial fibrillation: Secondary | ICD-10-CM

## 2016-11-19 DIAGNOSIS — E1122 Type 2 diabetes mellitus with diabetic chronic kidney disease: Secondary | ICD-10-CM | POA: Diagnosis not present

## 2016-11-19 DIAGNOSIS — I10 Essential (primary) hypertension: Secondary | ICD-10-CM

## 2016-11-19 LAB — HEPATIC FUNCTION PANEL
ALBUMIN: 3.6 g/dL (ref 3.6–5.1)
ALK PHOS: 84 U/L (ref 33–130)
ALT: 9 U/L (ref 6–29)
AST: 11 U/L (ref 10–35)
BILIRUBIN INDIRECT: 0.3 mg/dL (ref 0.2–1.2)
Bilirubin, Direct: 0.1 mg/dL (ref <=0.2)
TOTAL PROTEIN: 6.3 g/dL (ref 6.1–8.1)
Total Bilirubin: 0.4 mg/dL (ref 0.2–1.2)

## 2016-11-19 LAB — CBC WITH DIFFERENTIAL/PLATELET
BASOS ABS: 0 {cells}/uL (ref 0–200)
Basophils Relative: 0 %
EOS ABS: 59 {cells}/uL (ref 15–500)
Eosinophils Relative: 1 %
HEMATOCRIT: 37.2 % (ref 35.0–45.0)
HEMOGLOBIN: 10.3 g/dL — AB (ref 11.7–15.5)
LYMPHS ABS: 1121 {cells}/uL (ref 850–3900)
LYMPHS PCT: 19 %
MCH: 22.3 pg — ABNORMAL LOW (ref 27.0–33.0)
MCHC: 27.7 g/dL — AB (ref 32.0–36.0)
MCV: 80.5 fL (ref 80.0–100.0)
MPV: 9.4 fL (ref 7.5–12.5)
Monocytes Absolute: 531 cells/uL (ref 200–950)
Monocytes Relative: 9 %
NEUTROS PCT: 71 %
Neutro Abs: 4189 cells/uL (ref 1500–7800)
Platelets: 239 10*3/uL (ref 140–400)
RBC: 4.62 MIL/uL (ref 3.80–5.10)
RDW: 19.2 % — AB (ref 11.0–15.0)
WBC: 5.9 10*3/uL (ref 3.8–10.8)

## 2016-11-19 LAB — BASIC METABOLIC PANEL WITH GFR
BUN: 17 mg/dL (ref 7–25)
CALCIUM: 9 mg/dL (ref 8.6–10.4)
CO2: 38 mmol/L — AB (ref 20–31)
Chloride: 97 mmol/L — ABNORMAL LOW (ref 98–110)
Creat: 0.85 mg/dL (ref 0.60–0.93)
GFR, EST AFRICAN AMERICAN: 80 mL/min (ref >=60)
GFR, EST NON AFRICAN AMERICAN: 70 mL/min (ref >=60)
GLUCOSE: 99 mg/dL (ref 65–99)
Potassium: 4.1 mmol/L (ref 3.5–5.3)
Sodium: 143 mmol/L (ref 135–146)

## 2016-11-19 MED ORDER — ACETAMINOPHEN-CODEINE #3 300-30 MG PO TABS: 1.0000 | ORAL_TABLET | Freq: Three times a day (TID) | ORAL | 0 refills | Status: DC | PRN

## 2016-11-19 NOTE — Patient Instructions (Addendum)
symbicort use twice daily and wash your mouth afterwards Albuterol you can do AS NEEDED every 4-6 hours  We want to keep your weight around 245 lb Please weigh yourself daily Keep track of your weight and BP daily  Decrease salt and sugar Do not drink more than 1.5 L of fluid a day  Call your doctor if:  Anytime you have any of the following symptoms:  1) 2 pound weight gain in 24 hours or 5 pounds in 1 week  2) shortness of breath, with or without a dry hacking cough  3) swelling in the hands, LEGs, feet or stomach  4) if you have to sleep on extra pillows at night in order to breathe. 5) after laying down at night for 20-30 mins, you wake up short of breath.   These can all be signs of fluid overload.   Has cardiology follow up 12/06/2016 at 130 with Dr. Gwenlyn Found

## 2016-11-19 NOTE — Progress Notes (Deleted)
Hospital follow up  Assessment and Plan: [phone] (all) [phone] (if 7 days high complexity)  hospital visit follow up for ***:  Hospital discharge meds were reviewed, and reconciled with the patient.   There are no discontinued medications. CAN NOT DO FOR BCBS REGULAR OR MEDICARE  Over 40 minutes of exam, counseling, chart review, and complex, high/moderate level critical decision making was performed this visit.     HPI 71 y.o.female presents for follow up for transition from recent hospitalization. Admit date to the hospital was 11/11/16, patient was discharged from the hospital on 11/15/16 and our clinical staff contacted the office the day after discharge to set up a follow up appointment, patient was admitted for:    Images while in the hospital: Dg Chest Portable 1 View  Result Date: 11/11/2016 CLINICAL DATA:  Abnormal EKG. EXAM: PORTABLE CHEST 1 VIEW COMPARISON:  07/31/2016 FINDINGS: 1857 hours. Low volume film. The cardio pericardial silhouette is enlarged. Pulmonary vascular congestion noted with probable interstitial pulmonary edema. There is bibasilar collapse/consolidation with small bilateral pleural effusions. The visualized bony structures of the thorax are intact. Telemetry leads overlie the chest. IMPRESSION: Cardiomegaly with interstitial edema, bibasilar collapse/ consolidation, and small effusions. Electronically Signed   By: Misty Stanley M.D.   On: 11/11/2016 19:09    Past Medical History:  Diagnosis Date  . Arthritis   . COPD (chronic obstructive pulmonary disease) ([place])   . Depression   . Diabetes mellitus type 2 in obese ([place])   . GERD (gastroesophageal reflux disease)   . Hyperlipidemia   . Hypertension   . Morbid obesity ([place])   . OAB (overactive bladder)   . PSVT (paroxysmal supraventricular tachycardia) ([place])   . Thyroid disease   . Vitamin D deficiency      Allergies  Allergen Reactions  . Inderal [Propranolol] Other (See Comments)    Hair loss  . Ace  Inhibitors Cough      Current Outpatient Prescriptions on File Prior to Visit  Medication Sig Dispense Refill  . acetaminophen-codeine (TYLENOL #3) 300-30 MG per tablet Take 1 tablet by mouth every 8 (eight) hours as needed for moderate pain or severe pain. 90 tablet 0  . acetaZOLAMIDE (DIAMOX) 125 MG tablet Take 1 tablet 3 x/ day (Patient taking differently: Take 125 mg by mouth 3 (three) times daily. ) 270 tablet 1  . albuterol (PROVENTIL HFA;VENTOLIN HFA) 108 (90 Base) MCG/ACT inhaler Inhale 1-2 puffs into the lungs every 6 (six) hours as needed for wheezing or shortness of breath. 18 g 2  . aspirin 81 MG tablet Take 81 mg by mouth daily.    . bisoprolol (ZEBETA) 10 MG tablet Take 1 tablet (10 mg total) by mouth daily. 30 tablet 0  . budesonide-formoterol (SYMBICORT) 160-4.5 MCG/ACT inhaler 2 puffs twice a day, wash mouth afterwards (Patient taking differently: Inhale 2 puffs into the lungs 2 (two) times daily. ) 1 Inhaler 0  . Cholecalciferol (VITAMIN D PO) Take 2,000 Units by mouth daily.     Marland Kitchen diltiazem (CARDIZEM CD) 300 MG 24 hr capsule Take 1 capsule (300 mg total) by mouth daily. 30 capsule 2  . DULoxetine (CYMBALTA) 60 MG capsule TAKE 1 CAPSULE BY MOUTH DAILY 90 capsule 1  . famotidine (PEPCID) 20 MG tablet TAKE 1 TABLET (20 MG TOTAL) BY MOUTH 2 (TWO) TIMES DAILY. FOR ACID REFLUX 180 tablet 0  . ferrous sulfate 325 (65 FE) MG tablet Take 325 mg by mouth 2 (two) times daily with a meal.    .  fluticasone (FLONASE) 50 MCG/ACT nasal spray Place 2 sprays into both nostrils daily. (Patient taking differently: Place 2 sprays into both nostrils daily as needed for allergies. ) 16 g 0  . furosemide (LASIX) 80 MG tablet Take 1 tablet (80 mg total) by mouth daily. (Patient taking differently: Take 40-80 mg by mouth daily as needed for fluid. ) 90 tablet 0  . guaiFENesin (MUCINEX) 600 MG 12 hr tablet Take 600 mg by mouth daily.    Marland Kitchen levothyroxine (SYNTHROID, LEVOTHROID) 175 MCG tablet TAKE 1 TABLET  (175 MCG TOTAL) BY MOUTH DAILY BEFORE BREAKFAST. 90 tablet 0  . loratadine-pseudoephedrine (CLARITIN-D 24-HOUR) 10-240 MG per 24 hr tablet Take 1 tablet by mouth as needed for allergies.     Marland Kitchen losartan (COZAAR) 50 MG tablet TAKE 1 TABLET BY MOUTH DAILY 90 tablet 2  . Magnesium Chloride (MAGNESIUM DR PO) Take 1 capsule by mouth daily.     . metFORMIN (GLUCOPHAGE-XR) 500 MG 24 hr tablet TAKE 1 TABLET (500 MG TOTAL) BY MOUTH 4 (FOUR) TIMES DAILY - AFTER MEALS AND AT BEDTIME. 360 tablet 1  . OXYGEN-HELIUM IN Inhale 5 L into the lungs.     . pravastatin (PRAVACHOL) 40 MG tablet TAKE 1 TABLET (40 MG TOTAL) BY MOUTH DAILY. 90 tablet 1  . warfarin (COUMADIN) 5 MG tablet TAKE 1 TABLET (5 MG TOTAL) BY MOUTH ONE TIME ONLY AT 6 PM. (Patient taking differently: Take 5 mg by mouth See admin instructions. Take 2.5 mg by mouth daily on Sunday and Wednesday. Take 5 mg by mouth daily on all other days) 90 tablet 1   No current facility-administered medications on file prior to visit.     ROS: all negative except above.   Physical Exam: There were no vitals filed for this visit. There were no vitals taken for this visit. General Appearance: Well developed well nourished, non-toxic appearing in no apparent distress. Eyes: PERRLA, EOMs, conjunctiva w/ no swelling or erythema or discharge Sinuses: No Frontal/maxillary tenderness ENT/Mouth: Ear canals clear without swelling or erythema.  TM's normal bilaterally with no retractions, bulging, or loss of landmarks.   Neck: Supple, thyroid normal, no notable JVD  Respiratory: Respiratory effort normal, Clear breath sounds anteriorly and posteriorly bilaterally without rales, rhonchi, wheezing or stridor. No retractions or accessory muscle usage. Cardio: RRR with no MRGs.   Abdomen: Soft, + BS.  Non tender, no guarding, rebound, hernias, masses.  Musculoskeletal: Full ROM, 5/5 strength, normal gait.  Skin: Warm, dry without rashes  Neuro: Awake and oriented X 3,  Cranial nerves intact. Normal muscle tone, no cerebellar symptoms. Sensation intact.  Psych: normal affect, Insight and Judgment appropriate.   [name], PA-C 12:43 PM [facility]

## 2016-11-19 NOTE — Progress Notes (Signed)
Hospital follow up  Assessment and Plan: Hospital visit follow up for  Atrial fibrillation with RVR ([place]) Continue coumadin, check today -     CBC with Differential/Platelet -     BASIC METABOLIC PANEL WITH GFR -     Protime-INR  Chronic diastolic HF (heart failure), with recent mild exacerbation Monitor weight, decrease salt/sugar -     BASIC METABOLIC PANEL WITH GFR -     Hepatic function panel  Chronic obstructive pulmonary disease, unspecified COPD type (HCC) Continue oxygen, do symbicort daily, albuterol more as needed, get on CPAP  Type 2 diabetes mellitus with stage 3 chronic kidney disease, without long-term current use of insulin ([place]) Discussed general issues about diabetes pathophysiology and management., Educational material distributed., Suggested low cholesterol diet., Encouraged aerobic exercise., Discussed foot care., Reminded to get yearly retinal exam.  Essential hypertension - continue medications, DASH diet, exercise and monitor at home. Call if greater than 130/80.  -     CBC with Differential/Platelet -     BASIC METABOLIC PANEL WITH GFR -     Hepatic function panel  Oxygen dependent Continue oxygen, and get sleep study, needs CPAP  Morbid obesity ([place]) - long discussion about weight loss, diet, and exercise  Other orders -     acetaminophen-codeine (TYLENOL #3) 300-30 MG tablet; Take 1 tablet by mouth every 8 (eight) hours as needed for moderate pain or severe pain.  Hospital discharge meds were reviewed, and reconciled with the patient.   There are no discontinued medications. Over 40 minutes of exam, counseling, chart review, and complex, high/moderate level critical decision making was performed this visit.   Future Appointments Date Time Provider [place]  12/04/2016 8:00 PM [facility] 3 [facility]  12/06/2016 1:30 PM Lorretta Harp, MD [facility]  12/09/2016 4:15 PM [name], PA-C [facility] None  01/09/2017 3:30  PM Starlyn Skeans, PA-C [facility] None  02/11/2017 2:45 PM Unk Pinto, MD [facility] None  12/16/2017 3:00 PM Unk Pinto, MD [facility] None      HPI 71 y.o.female presents for follow up for transition from recent hospitalization.  Admit date to the hospital was 11/11/16, patient was discharged from the hospital on 11/15/16 and our clinical staff contacted the office the day after discharge to set up a follow up appointment, patient was admitted for: Afib with RVR and diastolic heart failure. She is on coumadin, has CHADSVASC 4, diuresed while in the hospital and was down 15 liters/20lbs, she is on 5 mg daily, lasix and diamox, off the zaroxyln. Has follow up with Dr. Gwenlyn Found 12/06/2016. She is on 3 diamox and 80mg  of lasix a day. She sates she is breathing better, feeling better, has not used albuterol or symbicort since being home. She has sleep study on the 21st, wears sometimes.   BMI is Body mass index is 40.94 kg/m., she is working on diet and exercise. Wt Readings from Last 3 Encounters:  11/19/16 246 lb (111.6 kg)  11/15/16 247 lb 6.4 oz (112.2 kg)  11/11/16 267 lb 9.6 oz (121.4 kg)   Lab Results  Component Value Date   INR 1.76 11/15/2016   INR 1.40 11/14/2016   INR 1.38 11/13/2016   Images while in the hospital: Dg Chest Portable 1 View  Result Date: 11/11/2016 CLINICAL DATA:  Abnormal EKG. EXAM: PORTABLE CHEST 1 VIEW COMPARISON:  07/31/2016 FINDINGS: 1857 hours. Low volume film. The cardio pericardial silhouette is enlarged. Pulmonary vascular congestion noted with probable interstitial pulmonary edema. There  is bibasilar collapse/consolidation with small bilateral pleural effusions. The visualized bony structures of the thorax are intact. Telemetry leads overlie the chest. IMPRESSION: Cardiomegaly with interstitial edema, bibasilar collapse/ consolidation, and small effusions. Electronically Signed   By: Misty Stanley M.D.   On: 11/11/2016 19:09    Past Medical History:   Diagnosis Date  . Arthritis   . COPD (chronic obstructive pulmonary disease) ([place])   . Depression   . Diabetes mellitus type 2 in obese ([place])   . GERD (gastroesophageal reflux disease)   . Hyperlipidemia   . Hypertension   . Morbid obesity ([place])   . OAB (overactive bladder)   . PSVT (paroxysmal supraventricular tachycardia) ([place])   . Thyroid disease   . Vitamin D deficiency      Allergies  Allergen Reactions  . Inderal [Propranolol] Other (See Comments)    Hair loss  . Ace Inhibitors Cough      Current Outpatient Prescriptions on File Prior to Visit  Medication Sig Dispense Refill  . acetaminophen-codeine (TYLENOL #3) 300-30 MG per tablet Take 1 tablet by mouth every 8 (eight) hours as needed for moderate pain or severe pain. 90 tablet 0  . acetaZOLAMIDE (DIAMOX) 125 MG tablet Take 1 tablet 3 x/ day (Patient taking differently: Take 125 mg by mouth 3 (three) times daily. ) 270 tablet 1  . albuterol (PROVENTIL HFA;VENTOLIN HFA) 108 (90 Base) MCG/ACT inhaler Inhale 1-2 puffs into the lungs every 6 (six) hours as needed for wheezing or shortness of breath. 18 g 2  . aspirin 81 MG tablet Take 81 mg by mouth daily.    . bisoprolol (ZEBETA) 10 MG tablet Take 1 tablet (10 mg total) by mouth daily. 30 tablet 0  . budesonide-formoterol (SYMBICORT) 160-4.5 MCG/ACT inhaler 2 puffs twice a day, wash mouth afterwards (Patient taking differently: Inhale 2 puffs into the lungs 2 (two) times daily. ) 1 Inhaler 0  . Cholecalciferol (VITAMIN D PO) Take 2,000 Units by mouth daily.     Marland Kitchen diltiazem (CARDIZEM CD) 300 MG 24 hr capsule Take 1 capsule (300 mg total) by mouth daily. 30 capsule 2  . DULoxetine (CYMBALTA) 60 MG capsule TAKE 1 CAPSULE BY MOUTH DAILY 90 capsule 1  . famotidine (PEPCID) 20 MG tablet TAKE 1 TABLET (20 MG TOTAL) BY MOUTH 2 (TWO) TIMES DAILY. FOR ACID REFLUX 180 tablet 0  . ferrous sulfate 325 (65 FE) MG tablet Take 325 mg by mouth 2 (two) times daily with a meal.    .  fluticasone (FLONASE) 50 MCG/ACT nasal spray Place 2 sprays into both nostrils daily. (Patient taking differently: Place 2 sprays into both nostrils daily as needed for allergies. ) 16 g 0  . furosemide (LASIX) 80 MG tablet Take 1 tablet (80 mg total) by mouth daily. (Patient taking differently: Take 40-80 mg by mouth daily as needed for fluid. ) 90 tablet 0  . guaiFENesin (MUCINEX) 600 MG 12 hr tablet Take 600 mg by mouth daily.    Marland Kitchen levothyroxine (SYNTHROID, LEVOTHROID) 175 MCG tablet TAKE 1 TABLET (175 MCG TOTAL) BY MOUTH DAILY BEFORE BREAKFAST. 90 tablet 0  . loratadine-pseudoephedrine (CLARITIN-D 24-HOUR) 10-240 MG per 24 hr tablet Take 1 tablet by mouth as needed for allergies.     Marland Kitchen losartan (COZAAR) 50 MG tablet TAKE 1 TABLET BY MOUTH DAILY 90 tablet 2  . Magnesium Chloride (MAGNESIUM DR PO) Take 1 capsule by mouth daily.     . metFORMIN (GLUCOPHAGE-XR) 500 MG 24 hr tablet TAKE  1 TABLET (500 MG TOTAL) BY MOUTH 4 (FOUR) TIMES DAILY - AFTER MEALS AND AT BEDTIME. 360 tablet 1  . OXYGEN-HELIUM IN Inhale 5 L into the lungs.     . pravastatin (PRAVACHOL) 40 MG tablet TAKE 1 TABLET (40 MG TOTAL) BY MOUTH DAILY. 90 tablet 1  . warfarin (COUMADIN) 5 MG tablet TAKE 1 TABLET (5 MG TOTAL) BY MOUTH ONE TIME ONLY AT 6 PM. (Patient taking differently: Take 5 mg by mouth See admin instructions. Take 2.5 mg by mouth daily on Sunday and Wednesday. Take 5 mg by mouth daily on all other days) 90 tablet 1   No current facility-administered medications on file prior to visit.     ROS: all negative except above.   Physical Exam: Filed Weights   11/19/16 1442  Weight: 246 lb (111.6 kg)   BP 118/78   Pulse 82   Temp 97.3 F (36.3 C)   Resp 16   Ht 5\' 5"  (1.651 m)   Wt 246 lb (111.6 kg)   SpO2 95%   BMI 40.94 kg/m  General Appearance: Well nourished, in no apparent distress. Eyes: PERRLA, EOMs, conjunctiva no swelling or erythema Sinuses: No Frontal/maxillary tenderness ENT/Mouth: Ext aud canals  clear, TMs without erythema, bulging. No erythema, swelling, or exudate on post pharynx.  Tonsils not swollen or erythematous. Hearing normal.  Neck: Supple, thyroid normal.  Respiratory: Respiratory effort normal, BS equal bilaterally without rales, rhonchi, wheezing or stridor.  Cardio: RRR with no MRGs. Brisk peripheral pulses without edema.  Abdomen: Soft, + BS.  Non tender, no guarding, rebound, hernias, masses. Lymphatics: Non tender without lymphadenopathy.  Musculoskeletal: Full ROM, 5/5 strength, normal gait.  Skin: Warm, dry without rashes, lesions, ecchymosis.  Neuro: Cranial nerves intact. Normal muscle tone, no cerebellar symptoms. Sensation intact.  Psych: Awake and oriented X 3, normal affect, Insight and Judgment appropriate.     [name], PA-C 2:56 PM [facility]

## 2016-11-20 LAB — PROTIME-INR
INR: 1.5 — AB
Prothrombin Time: 16.1 s — ABNORMAL HIGH (ref 9.0–11.5)

## 2016-11-20 LAB — IRON,TIBC AND FERRITIN PANEL
%SAT: 6 % — ABNORMAL LOW (ref 11–50)
Ferritin: 19 ng/mL — ABNORMAL LOW (ref 20–288)
Iron: 31 ug/dL — ABNORMAL LOW (ref 45–160)
TIBC: 525 ug/dL — AB (ref 250–450)

## 2016-11-20 NOTE — Progress Notes (Signed)
recheck in 1 month. Scot Dock

## 2016-11-20 NOTE — Progress Notes (Signed)
Pt aware of lab results & voiced understanding of those results.

## 2016-12-02 ENCOUNTER — Other Ambulatory Visit: Payer: Self-pay | Admitting: Internal Medicine

## 2016-12-02 ENCOUNTER — Other Ambulatory Visit: Payer: Self-pay | Admitting: *Deleted

## 2016-12-02 ENCOUNTER — Encounter: Payer: Self-pay | Admitting: Internal Medicine

## 2016-12-02 MED ORDER — WARFARIN SODIUM 5 MG PO TABS: 5.0000 mg | ORAL_TABLET | Freq: Once | ORAL | 0 refills | Status: DC

## 2016-12-04 ENCOUNTER — Encounter (HOSPITAL_BASED_OUTPATIENT_CLINIC_OR_DEPARTMENT_OTHER): Payer: PPO

## 2016-12-04 ENCOUNTER — Other Ambulatory Visit: Payer: Self-pay | Admitting: *Deleted

## 2016-12-04 MED ORDER — WARFARIN SODIUM 5 MG PO TABS: ORAL_TABLET | ORAL | 1 refills | Status: DC

## 2016-12-06 ENCOUNTER — Ambulatory Visit (INDEPENDENT_AMBULATORY_CARE_PROVIDER_SITE_OTHER): Payer: PPO | Admitting: Cardiovascular Disease

## 2016-12-06 ENCOUNTER — Encounter: Payer: Self-pay | Admitting: Cardiovascular Disease

## 2016-12-06 VITALS — BP 102/68 | HR 101 | Ht 66.0 in | Wt 255.0 lb

## 2016-12-06 DIAGNOSIS — E7800 Pure hypercholesterolemia, unspecified: Secondary | ICD-10-CM | POA: Diagnosis not present

## 2016-12-06 DIAGNOSIS — I5033 Acute on chronic diastolic (congestive) heart failure: Secondary | ICD-10-CM

## 2016-12-06 DIAGNOSIS — I10 Essential (primary) hypertension: Secondary | ICD-10-CM

## 2016-12-06 DIAGNOSIS — Z720 Tobacco use: Secondary | ICD-10-CM | POA: Diagnosis not present

## 2016-12-06 DIAGNOSIS — I4891 Unspecified atrial fibrillation: Secondary | ICD-10-CM

## 2016-12-06 NOTE — Assessment & Plan Note (Signed)
History of hyperlipidemia on statin therapy with recent lipid profile performed 06/06/16 revealed a total cholesterol 180, LDL 68 and HDL of 75.

## 2016-12-06 NOTE — Assessment & Plan Note (Signed)
History of acute on chronic diastolic heart failure with normal LV systolic function on 80 mg of Lasix a day in addition to Diamox. Her weight has gone up somewhat since she was discharged from the hospital on 3/6 although she does not appear volume overloaded on exam today. She doesn't weigh herself on a daily basis. I told her that her weight starts to increase she can take an extra half of Lasix in the evening hours.

## 2016-12-06 NOTE — Progress Notes (Signed)
12/06/2016 Jillian Wood   1946/07/09  [id number]  Primary Physician Jillian DAVID, MD Primary Cardiologist: Jillian Harp MD Jillian Wood  HPI:  [name] is a very pleasant 71 year old severely overweight married Caucasian female mother of 3 children, grandmother of 2 grandchildren is accompanied by her husband was also a patient of mine and son today.. I last saw her in the office 12/02/14 She was referred by Dr. Melford Aase for cardiovascular evaluation because of an episode of prolonged chest pressure. Her cardiovascular risk factor profile is notable for 50 pack years of tobacco abuse currently smoking 3-5 cigarettes a day down from one pack per day.. She has treated hypertension, hyperlipidemia and non-insulin-requiring diabetes. There is no family history of heart disease. She has never had a heart attack stroke. 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 Since I saw her a year ago she denies chest pain. She has chronic shortness of breath on home O2. A Myoview stress test and 2-D echo performed last year were unremarkable. She has developed SVT which have responded to vagal maneuvers. She has seen Dr. Rayann Heman for this in the past. She has had multiple admissions for COPD exacerbation, diastolic heart failure and A. fib with RVR most recently 11/11/16 through 3/2. She was diuresed and rate controlled. Has stopped smoking 07/31/16.    Current Outpatient Prescriptions  Medication Sig Dispense Refill  . acetaminophen-codeine (TYLENOL #3) 300-30 MG tablet Take 1 tablet by mouth every 8 (eight) hours as needed for moderate pain or severe pain. 90 tablet 0  . acetaZOLAMIDE (DIAMOX) 125 MG tablet Take 1 tablet 3 x/ day (Patient taking differently: Take 125 mg by mouth 3 (three) times daily. ) 270 tablet 1  . albuterol (PROVENTIL HFA;VENTOLIN HFA) 108 (90 Base) MCG/ACT inhaler Inhale 1-2 puffs into the lungs every 6 (six) hours as needed for wheezing or  shortness of breath. 18 g 2  . aspirin 81 MG tablet Take 81 mg by mouth daily.    . bisoprolol (ZEBETA) 10 MG tablet Take 1 tablet (10 mg total) by mouth daily. 30 tablet 0  . budesonide-formoterol (SYMBICORT) 160-4.5 MCG/ACT inhaler 2 puffs twice a day, wash mouth afterwards (Patient taking differently: Inhale 2 puffs into the lungs 2 (two) times daily. ) 1 Inhaler 0  . Cholecalciferol (VITAMIN D PO) Take 2,000 Units by mouth daily.     Marland Kitchen diltiazem (CARDIZEM CD) 300 MG 24 hr capsule Take 1 capsule (300 mg total) by mouth daily. 30 capsule 2  . DULoxetine (CYMBALTA) 60 MG capsule TAKE 1 CAPSULE BY MOUTH DAILY 90 capsule 1  . famotidine (PEPCID) 20 MG tablet TAKE 1 TABLET (20 MG TOTAL) BY MOUTH 2 (TWO) TIMES DAILY. FOR ACID REFLUX 180 tablet 0  . ferrous sulfate 325 (65 FE) MG tablet Take 325 mg by mouth 2 (two) times daily with a meal.    . fluticasone (FLONASE) 50 MCG/ACT nasal spray Place 2 sprays into both nostrils daily. (Patient taking differently: Place 2 sprays into both nostrils daily as needed for allergies. ) 16 g 0  . furosemide (LASIX) 80 MG tablet Take 1 tablet (80 mg total) by mouth daily. (Patient taking differently: Take 40-80 mg by mouth daily as needed for fluid. ) 90 tablet 0  . guaiFENesin (MUCINEX) 600 MG 12 hr tablet Take 600 mg by mouth daily.    Marland Kitchen levothyroxine (SYNTHROID, LEVOTHROID) 175 MCG tablet TAKE 1 TABLET (175 MCG TOTAL) BY MOUTH  DAILY BEFORE BREAKFAST. 90 tablet 0  . loratadine-pseudoephedrine (CLARITIN-D 24-HOUR) 10-240 MG per 24 hr tablet Take 1 tablet by mouth as needed for allergies.     Marland Kitchen losartan (COZAAR) 50 MG tablet TAKE 1 TABLET BY MOUTH DAILY 90 tablet 2  . Magnesium Chloride (MAGNESIUM DR PO) Take 1 capsule by mouth daily.     . metFORMIN (GLUCOPHAGE-XR) 500 MG 24 hr tablet TAKE 1 TABLET (500 MG TOTAL) BY MOUTH 4 (FOUR) TIMES DAILY - AFTER MEALS AND AT BEDTIME. 360 tablet 1  . OXYGEN-HELIUM IN Inhale 5 L into the lungs.     . pravastatin (PRAVACHOL) 40 MG  tablet TAKE 1 TABLET (40 MG TOTAL) BY MOUTH DAILY. 90 tablet 1  . warfarin (COUMADIN) 5 MG tablet Take 1 to 1.5 tablets daily or as directed. 90 tablet 1   No current facility-administered medications for this visit.     Allergies  Allergen Reactions  . Inderal [Propranolol] Other (See Comments)    Hair loss  . Ace Inhibitors Cough    Social History   Social History  . Marital status: Married    Spouse name: N/A  . Number of children: N/A  . Years of education: N/A   Occupational History  . Not on file.   Social History Main Topics  . Smoking status: Former Smoker    Packs/day: 0.50    Years: 44.00    Types: Cigarettes    Quit date: 08/15/2016  . Smokeless tobacco: Never Used     Comment: smoke about 5-6 cigarette daily  . Alcohol use No  . Drug use: No  . Sexual activity: Not on file   Other Topics Concern  . Not on file   Social History Narrative  . No narrative on file     Review of Systems: General: negative for chills, fever, night sweats or weight changes.  Cardiovascular: negative for chest pain, dyspnea on exertion, edema, orthopnea, palpitations, paroxysmal nocturnal dyspnea or shortness of breath Dermatological: negative for rash Respiratory: negative for cough or wheezing Urologic: negative for hematuria Abdominal: negative for nausea, vomiting, diarrhea, bright red blood per rectum, melena, or hematemesis Neurologic: negative for visual changes, syncope, or dizziness All other systems reviewed and are otherwise negative except as noted above.    Blood pressure 102/68, pulse (!) 101, height 5\' 6"  (1.676 m), weight 255 lb (115.7 kg).  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 I personally reviewed this EKG.  ASSESSMENT AND PLAN:   Essential hypertension History of hypertension blood pressure measured 102/68. She is on diltiazem and losartan. He had current meds at current dosing  Hyperlipidemia History of hyperlipidemia on statin therapy with recent lipid profile performed 06/06/16 revealed a total cholesterol 180, LDL 68 and HDL of 75.  Tobacco abuse History of COPD with tobacco abuse having quit 07/31/16.  Acute on chronic diastolic CHF (congestive heart failure) (HCC) History of acute on chronic diastolic heart failure with normal LV systolic function on 80 mg of Lasix a day in addition to Diamox. Her weight has gone up somewhat since she was discharged from the hospital on 3/6 although she does not appear  volume overloaded on exam today. She doesn't weigh herself on a daily basis. I told her that her weight starts to increase she can take an extra half of Lasix in the evening hours.  Atrial fibrillation with RVR (HCC) History of AF with rapid ventricular response on Coumadin anticoagulation followed by her PCP as well as Cardizem for rate control.      Jillian Harp MD FACP,FACC,FAHA, [place] 12/06/2016 2:00 PM

## 2016-12-06 NOTE — Assessment & Plan Note (Signed)
History of hypertension blood pressure measured 102/68. She is on diltiazem and losartan. He had current meds at current dosing

## 2016-12-06 NOTE — Assessment & Plan Note (Signed)
History of AF with rapid ventricular response on Coumadin anticoagulation followed by her PCP as well as Cardizem for rate control.

## 2016-12-06 NOTE — Assessment & Plan Note (Signed)
History of COPD with tobacco abuse having quit 07/31/16.

## 2016-12-06 NOTE — Patient Instructions (Signed)
Medication Instructions:  Your physician recommends that you continue on your current medications as directed. Please refer to the Current Medication list given to you today.  Labwork: None   Testing/Procedures: None   Follow-Up: Your physician recommends that you schedule a follow-up appointment in: 1 month with Rosaria Ferries, PA  Your physician wants you to follow-up in: 6 months with DR BERRY. You will receive a reminder letter in the mail two months in advance. If you don't receive a letter, please call our office to schedule the follow-up appointment.  Any Other Special Instructions Will Be Listed Below (If Applicable).     If you need a refill on your cardiac medications before your next appointment, please call your pharmacy.

## 2016-12-09 ENCOUNTER — Encounter: Payer: Self-pay | Admitting: Physician Assistant

## 2016-12-09 ENCOUNTER — Inpatient Hospital Stay (HOSPITAL_COMMUNITY)
Admission: EM | Admit: 2016-12-09 | Discharge: 2016-12-16 | DRG: 291 | Disposition: A | Discharge status: Home Health | Payer: PPO | Attending: Nephrology | Admitting: Nephrology

## 2016-12-09 ENCOUNTER — Emergency Department (HOSPITAL_COMMUNITY): Payer: PPO

## 2016-12-09 ENCOUNTER — Ambulatory Visit (INDEPENDENT_AMBULATORY_CARE_PROVIDER_SITE_OTHER): Payer: PPO | Admitting: Physician Assistant

## 2016-12-09 ENCOUNTER — Encounter (HOSPITAL_COMMUNITY): Payer: Self-pay | Admitting: *Deleted

## 2016-12-09 VITALS — BP 124/80 | HR 102 | Temp 97.3°F | Ht 67.0 in | Wt 255.0 lb

## 2016-12-09 DIAGNOSIS — R6889 Other general symptoms and signs: Secondary | ICD-10-CM

## 2016-12-09 DIAGNOSIS — N183 Chronic kidney disease, stage 3 unspecified: Secondary | ICD-10-CM

## 2016-12-09 DIAGNOSIS — E559 Vitamin D deficiency, unspecified: Secondary | ICD-10-CM | POA: Diagnosis not present

## 2016-12-09 DIAGNOSIS — I088 Other rheumatic multiple valve diseases: Secondary | ICD-10-CM | POA: Diagnosis not present

## 2016-12-09 DIAGNOSIS — J9621 Acute and chronic respiratory failure with hypoxia: Secondary | ICD-10-CM | POA: Diagnosis not present

## 2016-12-09 DIAGNOSIS — I058 Other rheumatic mitral valve diseases: Secondary | ICD-10-CM | POA: Diagnosis present

## 2016-12-09 DIAGNOSIS — I272 Pulmonary hypertension, unspecified: Secondary | ICD-10-CM | POA: Diagnosis not present

## 2016-12-09 DIAGNOSIS — I420 Dilated cardiomyopathy: Secondary | ICD-10-CM | POA: Diagnosis not present

## 2016-12-09 DIAGNOSIS — J449 Chronic obstructive pulmonary disease, unspecified: Secondary | ICD-10-CM | POA: Diagnosis present

## 2016-12-09 DIAGNOSIS — Z87891 Personal history of nicotine dependence: Secondary | ICD-10-CM

## 2016-12-09 DIAGNOSIS — Q238 Other congenital malformations of aortic and mitral valves: Secondary | ICD-10-CM

## 2016-12-09 DIAGNOSIS — E669 Obesity, unspecified: Secondary | ICD-10-CM

## 2016-12-09 DIAGNOSIS — I4510 Unspecified right bundle-branch block: Secondary | ICD-10-CM | POA: Diagnosis not present

## 2016-12-09 DIAGNOSIS — F329 Major depressive disorder, single episode, unspecified: Secondary | ICD-10-CM | POA: Diagnosis not present

## 2016-12-09 DIAGNOSIS — E1122 Type 2 diabetes mellitus with diabetic chronic kidney disease: Secondary | ICD-10-CM | POA: Diagnosis not present

## 2016-12-09 DIAGNOSIS — D649 Anemia, unspecified: Secondary | ICD-10-CM | POA: Diagnosis present

## 2016-12-09 DIAGNOSIS — R0902 Hypoxemia: Secondary | ICD-10-CM | POA: Diagnosis not present

## 2016-12-09 DIAGNOSIS — Z79899 Other long term (current) drug therapy: Secondary | ICD-10-CM

## 2016-12-09 DIAGNOSIS — Z6841 Body Mass Index (BMI) 40.0 and over, adult: Secondary | ICD-10-CM | POA: Diagnosis not present

## 2016-12-09 DIAGNOSIS — I4891 Unspecified atrial fibrillation: Secondary | ICD-10-CM | POA: Diagnosis not present

## 2016-12-09 DIAGNOSIS — M1990 Unspecified osteoarthritis, unspecified site: Secondary | ICD-10-CM | POA: Diagnosis present

## 2016-12-09 DIAGNOSIS — E038 Other specified hypothyroidism: Secondary | ICD-10-CM | POA: Diagnosis not present

## 2016-12-09 DIAGNOSIS — E119 Type 2 diabetes mellitus without complications: Secondary | ICD-10-CM

## 2016-12-09 DIAGNOSIS — Z9981 Dependence on supplemental oxygen: Secondary | ICD-10-CM

## 2016-12-09 DIAGNOSIS — Z8249 Family history of ischemic heart disease and other diseases of the circulatory system: Secondary | ICD-10-CM

## 2016-12-09 DIAGNOSIS — Z888 Allergy status to other drugs, medicaments and biological substances status: Secondary | ICD-10-CM

## 2016-12-09 DIAGNOSIS — I10 Essential (primary) hypertension: Secondary | ICD-10-CM | POA: Diagnosis not present

## 2016-12-09 DIAGNOSIS — Z7901 Long term (current) use of anticoagulants: Secondary | ICD-10-CM | POA: Diagnosis not present

## 2016-12-09 DIAGNOSIS — R079 Chest pain, unspecified: Secondary | ICD-10-CM | POA: Diagnosis not present

## 2016-12-09 DIAGNOSIS — I340 Nonrheumatic mitral (valve) insufficiency: Secondary | ICD-10-CM | POA: Diagnosis not present

## 2016-12-09 DIAGNOSIS — I483 Typical atrial flutter: Secondary | ICD-10-CM | POA: Diagnosis not present

## 2016-12-09 DIAGNOSIS — Z79891 Long term (current) use of opiate analgesic: Secondary | ICD-10-CM

## 2016-12-09 DIAGNOSIS — E039 Hypothyroidism, unspecified: Secondary | ICD-10-CM | POA: Diagnosis not present

## 2016-12-09 DIAGNOSIS — Q248 Other specified congenital malformations of heart: Secondary | ICD-10-CM | POA: Diagnosis not present

## 2016-12-09 DIAGNOSIS — E785 Hyperlipidemia, unspecified: Secondary | ICD-10-CM | POA: Diagnosis present

## 2016-12-09 DIAGNOSIS — J441 Chronic obstructive pulmonary disease with (acute) exacerbation: Secondary | ICD-10-CM

## 2016-12-09 DIAGNOSIS — Z0001 Encounter for general adult medical examination with abnormal findings: Secondary | ICD-10-CM | POA: Diagnosis not present

## 2016-12-09 DIAGNOSIS — R7309 Other abnormal glucose: Secondary | ICD-10-CM | POA: Diagnosis present

## 2016-12-09 DIAGNOSIS — I4892 Unspecified atrial flutter: Secondary | ICD-10-CM | POA: Diagnosis not present

## 2016-12-09 DIAGNOSIS — E1169 Type 2 diabetes mellitus with other specified complication: Secondary | ICD-10-CM

## 2016-12-09 DIAGNOSIS — I110 Hypertensive heart disease with heart failure: Principal | ICD-10-CM | POA: Diagnosis present

## 2016-12-09 DIAGNOSIS — N3281 Overactive bladder: Secondary | ICD-10-CM | POA: Diagnosis present

## 2016-12-09 DIAGNOSIS — I471 Supraventricular tachycardia, unspecified: Secondary | ICD-10-CM

## 2016-12-09 DIAGNOSIS — Z720 Tobacco use: Secondary | ICD-10-CM

## 2016-12-09 DIAGNOSIS — I519 Heart disease, unspecified: Secondary | ICD-10-CM | POA: Diagnosis not present

## 2016-12-09 DIAGNOSIS — R0602 Shortness of breath: Secondary | ICD-10-CM | POA: Diagnosis not present

## 2016-12-09 DIAGNOSIS — K219 Gastro-esophageal reflux disease without esophagitis: Secondary | ICD-10-CM

## 2016-12-09 DIAGNOSIS — R05 Cough: Secondary | ICD-10-CM | POA: Diagnosis not present

## 2016-12-09 DIAGNOSIS — I5033 Acute on chronic diastolic (congestive) heart failure: Secondary | ICD-10-CM | POA: Diagnosis not present

## 2016-12-09 DIAGNOSIS — E1151 Type 2 diabetes mellitus with diabetic peripheral angiopathy without gangrene: Secondary | ICD-10-CM | POA: Diagnosis not present

## 2016-12-09 DIAGNOSIS — D509 Iron deficiency anemia, unspecified: Secondary | ICD-10-CM

## 2016-12-09 DIAGNOSIS — I700 Atherosclerosis of aorta: Secondary | ICD-10-CM

## 2016-12-09 DIAGNOSIS — Z860100 Personal history of colon polyps, unspecified: Secondary | ICD-10-CM

## 2016-12-09 DIAGNOSIS — Z0000 Encounter for general adult medical examination without abnormal findings: Secondary | ICD-10-CM

## 2016-12-09 DIAGNOSIS — J9610 Chronic respiratory failure, unspecified whether with hypoxia or hypercapnia: Secondary | ICD-10-CM | POA: Diagnosis not present

## 2016-12-09 DIAGNOSIS — Z7984 Long term (current) use of oral hypoglycemic drugs: Secondary | ICD-10-CM

## 2016-12-09 DIAGNOSIS — Z993 Dependence on wheelchair: Secondary | ICD-10-CM

## 2016-12-09 DIAGNOSIS — E7800 Pure hypercholesterolemia, unspecified: Secondary | ICD-10-CM

## 2016-12-09 HISTORY — DX: Heart failure, unspecified: I50.9

## 2016-12-09 LAB — CBC
HCT: 36.2 % (ref 36.0–46.0)
Hemoglobin: 10.1 g/dL — ABNORMAL LOW (ref 12.0–15.0)
MCH: 23.1 pg — ABNORMAL LOW (ref 26.0–34.0)
MCHC: 27.9 g/dL — ABNORMAL LOW (ref 30.0–36.0)
MCV: 82.6 fL (ref 78.0–100.0)
PLATELETS: 241 10*3/uL (ref 150–400)
RBC: 4.38 MIL/uL (ref 3.87–5.11)
RDW: 18.5 % — AB (ref 11.5–15.5)
WBC: 10.2 10*3/uL (ref 4.0–10.5)

## 2016-12-09 LAB — BASIC METABOLIC PANEL
Anion gap: 11 (ref 5–15)
BUN: 10 mg/dL (ref 6–20)
CO2: 30 mmol/L (ref 22–32)
CREATININE: 0.65 mg/dL (ref 0.44–1.00)
Calcium: 9.2 mg/dL (ref 8.9–10.3)
Chloride: 99 mmol/L — ABNORMAL LOW (ref 101–111)
GFR calc Af Amer: >60 mL/min (ref >60)
Glucose, Bld: 129 mg/dL — ABNORMAL HIGH (ref 65–99)
Potassium: 4.3 mmol/L (ref 3.5–5.1)
SODIUM: 140 mmol/L (ref 135–145)

## 2016-12-09 LAB — GLUCOSE, CAPILLARY: Glucose-Capillary: 198 mg/dL — ABNORMAL HIGH (ref 65–99)

## 2016-12-09 LAB — BRAIN NATRIURETIC PEPTIDE: B Natriuretic Peptide: 472.3 pg/mL — ABNORMAL HIGH (ref 0.0–100.0)

## 2016-12-09 LAB — PROTIME-INR
INR: 1.68
PROTHROMBIN TIME: 20 s — AB (ref 11.4–15.2)

## 2016-12-09 LAB — I-STAT TROPONIN, ED: Troponin i, poc: 0 ng/mL (ref 0.00–0.08)

## 2016-12-09 MED ORDER — FLUTICASONE PROPIONATE 50 MCG/ACT NA SUSP: 2.0000 | Freq: Every day | NASAL | Status: DC | PRN

## 2016-12-09 MED ORDER — FUROSEMIDE 10 MG/ML IJ SOLN
60.0000 mg | Freq: Two times a day (BID) | INTRAMUSCULAR | Status: DC
  Administered 2016-12-10 – 2016-12-11 (×4): 60 mg via INTRAVENOUS
  Filled 2016-12-09 (×4): qty 6

## 2016-12-09 MED ORDER — SODIUM CHLORIDE 0.9% FLUSH: 3.0000 mL | INTRAVENOUS | Status: DC | PRN

## 2016-12-09 MED ORDER — BISOPROLOL FUMARATE 5 MG PO TABS
10.0000 mg | ORAL_TABLET | Freq: Every day | ORAL | Status: DC
  Administered 2016-12-10 – 2016-12-16 (×7): 10 mg via ORAL
  Filled 2016-12-09 (×7): qty 2

## 2016-12-09 MED ORDER — ACETAMINOPHEN-CODEINE #3 300-30 MG PO TABS
1.0000 | ORAL_TABLET | Freq: Three times a day (TID) | ORAL | Status: DC | PRN
  Administered 2016-12-10 – 2016-12-15 (×8): 1 via ORAL
  Filled 2016-12-09 (×9): qty 1

## 2016-12-09 MED ORDER — INSULIN ASPART 100 UNIT/ML ~~LOC~~ SOLN
0.0000 [IU] | Freq: Three times a day (TID) | SUBCUTANEOUS | Status: DC
  Administered 2016-12-10: 3 [IU] via SUBCUTANEOUS
  Administered 2016-12-10: 5 [IU] via SUBCUTANEOUS
  Administered 2016-12-10: 3 [IU] via SUBCUTANEOUS
  Administered 2016-12-11 (×2): 8 [IU] via SUBCUTANEOUS
  Administered 2016-12-11: 3 [IU] via SUBCUTANEOUS
  Administered 2016-12-12: 5 [IU] via SUBCUTANEOUS
  Administered 2016-12-12: 8 [IU] via SUBCUTANEOUS
  Administered 2016-12-12 – 2016-12-13 (×2): 5 [IU] via SUBCUTANEOUS
  Administered 2016-12-13: 3 [IU] via SUBCUTANEOUS
  Administered 2016-12-13: 5 [IU] via SUBCUTANEOUS
  Administered 2016-12-14: 8 [IU] via SUBCUTANEOUS
  Administered 2016-12-14: 2 [IU] via SUBCUTANEOUS
  Administered 2016-12-14: 5 [IU] via SUBCUTANEOUS
  Administered 2016-12-15 (×2): 3 [IU] via SUBCUTANEOUS
  Administered 2016-12-15: 8 [IU] via SUBCUTANEOUS
  Administered 2016-12-16: 15 [IU] via SUBCUTANEOUS

## 2016-12-09 MED ORDER — DILTIAZEM LOAD VIA INFUSION
10.0000 mg | Freq: Once | INTRAVENOUS | Status: AC
  Administered 2016-12-10: 10 mg via INTRAVENOUS
  Filled 2016-12-09: qty 10

## 2016-12-09 MED ORDER — WARFARIN - PHARMACIST DOSING INPATIENT: Freq: Every day | Status: DC

## 2016-12-09 MED ORDER — GUAIFENESIN ER 600 MG PO TB12
600.0000 mg | ORAL_TABLET | Freq: Two times a day (BID) | ORAL | Status: DC
Start: 2016-12-09 — End: 2016-12-16
  Administered 2016-12-10 – 2016-12-16 (×14): 600 mg via ORAL
  Filled 2016-12-09 (×15): qty 1

## 2016-12-09 MED ORDER — LEVOTHYROXINE SODIUM 75 MCG PO TABS
175.0000 ug | ORAL_TABLET | Freq: Every day | ORAL | Status: DC
  Administered 2016-12-10 – 2016-12-15 (×5): 175 ug via ORAL
  Filled 2016-12-09 (×9): qty 1

## 2016-12-09 MED ORDER — IPRATROPIUM-ALBUTEROL 0.5-2.5 (3) MG/3ML IN SOLN
3.0000 mL | Freq: Four times a day (QID) | RESPIRATORY_TRACT | Status: DC
  Administered 2016-12-10 – 2016-12-11 (×6): 3 mL via RESPIRATORY_TRACT
  Filled 2016-12-09 (×7): qty 3

## 2016-12-09 MED ORDER — ACETAMINOPHEN 325 MG PO TABS: 650.0000 mg | ORAL_TABLET | ORAL | Status: DC | PRN

## 2016-12-09 MED ORDER — DILTIAZEM HCL 25 MG/5ML IV SOLN: 10.0000 mg | Freq: Once | INTRAVENOUS | Status: DC

## 2016-12-09 MED ORDER — FAMOTIDINE 20 MG PO TABS
20.0000 mg | ORAL_TABLET | Freq: Two times a day (BID) | ORAL | Status: DC
  Administered 2016-12-10 – 2016-12-16 (×13): 20 mg via ORAL
  Filled 2016-12-09 (×14): qty 1

## 2016-12-09 MED ORDER — LOSARTAN POTASSIUM 50 MG PO TABS
50.0000 mg | ORAL_TABLET | Freq: Every day | ORAL | Status: DC
Start: 2016-12-10 — End: 2016-12-16
  Administered 2016-12-10 – 2016-12-16 (×7): 50 mg via ORAL
  Filled 2016-12-09 (×7): qty 1

## 2016-12-09 MED ORDER — PRAVASTATIN SODIUM 40 MG PO TABS
40.0000 mg | ORAL_TABLET | Freq: Every day | ORAL | Status: DC
  Administered 2016-12-10 – 2016-12-15 (×6): 40 mg via ORAL
  Filled 2016-12-09 (×6): qty 1

## 2016-12-09 MED ORDER — DEXTROSE 5 % IV SOLN
500.0000 mg | INTRAVENOUS | Status: DC
  Administered 2016-12-10 – 2016-12-13 (×5): 500 mg via INTRAVENOUS
  Filled 2016-12-09 (×5): qty 500

## 2016-12-09 MED ORDER — ONDANSETRON HCL 4 MG/2ML IJ SOLN
4.0000 mg | Freq: Four times a day (QID) | INTRAMUSCULAR | Status: DC | PRN
  Administered 2016-12-12: 4 mg via INTRAVENOUS
  Filled 2016-12-09: qty 2

## 2016-12-09 MED ORDER — METHYLPREDNISOLONE SODIUM SUCC 125 MG IJ SOLR
60.0000 mg | Freq: Four times a day (QID) | INTRAMUSCULAR | Status: DC
  Administered 2016-12-10 – 2016-12-12 (×10): 60 mg via INTRAVENOUS
  Filled 2016-12-09 (×10): qty 2

## 2016-12-09 MED ORDER — INSULIN ASPART 100 UNIT/ML ~~LOC~~ SOLN
0.0000 [IU] | Freq: Every day | SUBCUTANEOUS | Status: DC
  Administered 2016-12-10: 3 [IU] via SUBCUTANEOUS
  Administered 2016-12-11: 2 [IU] via SUBCUTANEOUS
  Administered 2016-12-12: 3 [IU] via SUBCUTANEOUS
  Administered 2016-12-13: 5 [IU] via SUBCUTANEOUS
  Administered 2016-12-14 – 2016-12-15 (×2): 3 [IU] via SUBCUTANEOUS

## 2016-12-09 MED ORDER — DILTIAZEM HCL 90 MG PO TABS
90.0000 mg | ORAL_TABLET | Freq: Three times a day (TID) | ORAL | Status: DC
  Administered 2016-12-10 – 2016-12-12 (×7): 90 mg via ORAL
  Filled 2016-12-09 (×2): qty 3
  Filled 2016-12-09 (×3): qty 1
  Filled 2016-12-09 (×2): qty 3
  Filled 2016-12-09: qty 1
  Filled 2016-12-09 (×2): qty 3
  Filled 2016-12-09 (×3): qty 1
  Filled 2016-12-09: qty 3

## 2016-12-09 MED ORDER — DULOXETINE HCL 60 MG PO CPEP
60.0000 mg | ORAL_CAPSULE | Freq: Every day | ORAL | Status: DC
  Administered 2016-12-10 – 2016-12-16 (×6): 60 mg via ORAL
  Filled 2016-12-09 (×8): qty 1

## 2016-12-09 MED ORDER — IPRATROPIUM-ALBUTEROL 0.5-2.5 (3) MG/3ML IN SOLN: 3.0000 mL | RESPIRATORY_TRACT | Status: DC | PRN

## 2016-12-09 MED ORDER — MOMETASONE FURO-FORMOTEROL FUM 200-5 MCG/ACT IN AERO
2.0000 | INHALATION_SPRAY | Freq: Two times a day (BID) | RESPIRATORY_TRACT | Status: DC
Start: 2016-12-09 — End: 2016-12-16
  Administered 2016-12-10 – 2016-12-15 (×11): 2 via RESPIRATORY_TRACT
  Filled 2016-12-09 (×2): qty 8.8

## 2016-12-09 MED ORDER — SODIUM CHLORIDE 0.9% FLUSH
3.0000 mL | Freq: Two times a day (BID) | INTRAVENOUS | Status: DC
Start: 2016-12-09 — End: 2016-12-16
  Administered 2016-12-10: 3 mL via INTRAVENOUS
  Administered 2016-12-10: 22:00:00 via INTRAVENOUS
  Administered 2016-12-11 – 2016-12-16 (×10): 3 mL via INTRAVENOUS

## 2016-12-09 MED ORDER — DILTIAZEM HCL-DEXTROSE 100-5 MG/100ML-% IV SOLN (PREMIX)
5.0000 mg/h | INTRAVENOUS | Status: DC
Start: 2016-12-09 — End: 2016-12-12
  Administered 2016-12-10: 5 mg/h via INTRAVENOUS
  Filled 2016-12-09: qty 100

## 2016-12-09 MED ORDER — IPRATROPIUM-ALBUTEROL 0.5-2.5 (3) MG/3ML IN SOLN
3.0000 mL | Freq: Once | RESPIRATORY_TRACT | Status: AC
  Administered 2016-12-09: 3 mL via RESPIRATORY_TRACT
  Filled 2016-12-09: qty 3

## 2016-12-09 MED ORDER — SODIUM CHLORIDE 0.9 % IV SOLN
250.0000 mL | INTRAVENOUS | Status: DC | PRN
  Administered 2016-12-10: 250 mL via INTRAVENOUS

## 2016-12-09 MED ORDER — WARFARIN SODIUM 7.5 MG PO TABS
7.5000 mg | ORAL_TABLET | Freq: Once | ORAL | Status: AC
  Administered 2016-12-10: 7.5 mg via ORAL
  Filled 2016-12-09 (×2): qty 1

## 2016-12-09 MED ORDER — ASPIRIN 81 MG PO CHEW
81.0000 mg | CHEWABLE_TABLET | Freq: Every day | ORAL | Status: DC
  Administered 2016-12-10 – 2016-12-16 (×7): 81 mg via ORAL
  Filled 2016-12-09 (×7): qty 1

## 2016-12-09 MED ORDER — FERROUS SULFATE 325 (65 FE) MG PO TABS
325.0000 mg | ORAL_TABLET | Freq: Two times a day (BID) | ORAL | Status: DC
  Administered 2016-12-10 – 2016-12-15 (×11): 325 mg via ORAL
  Filled 2016-12-09 (×12): qty 1

## 2016-12-09 NOTE — Progress Notes (Signed)
Patient ID: Jillian Wood, female   DOB: 1946-02-19, 71 y.o.   MRN: [id number]  MEDICARE ANNUAL WELLNESS VISIT AND FOLLOW UP  Assessment:   COPD exacerbation  on 8 L of 02 stats 87 % and still feels SOB, not using BiPAP states feels like "suffacating with it, adjustments need to be made at sleep study in 4 weeks, will send to ER per patient preference since she lives 30 mins away, rule out CHF, COPD exacerbation, will do urgent referral for pulmonary out patient, may try to do nebulizers for meds  Essential hypertension - continue medications, DASH diet, exercise and monitor at home. Call if greater than 130/80.   Paroxysmal SVT (supraventricular tachycardia) (HCC) Continue follow up cardio  Atrial fibrillation with RVR (HCC) Needs to have INR checked  Acute on chronic diastolic CHF (congestive heart failure) ([place]) Continue follow up cardio  Dilated cardiomyopathy ([place]) Continue follow up cardio Pulmonary hypertension -     Ambulatory referral to Pulmonology  Typical atrial flutter ([place]) Continue follow up cardio Needs BiPAP  Chronic obstructive pulmonary disease, unspecified COPD type ([place]) -     Ambulatory referral to Pulmonology - will try to switch from inhaler to duoneb  Acute on chronic respiratory failure with hypoxia ([place]) -     Ambulatory referral to Pulmonology  Type 2 diabetes mellitus with stage 3 chronic kidney disease, without long-term current use of insulin ([place]) Discussed general issues about diabetes pathophysiology and management., Educational material distributed., Suggested low cholesterol diet., Encouraged aerobic exercise., Discussed foot care., Reminded to get yearly retinal exam.  Diabetes mellitus type 2 in obese [facility]) Discussed general issues about diabetes pathophysiology and management., Educational material distributed., Suggested low cholesterol diet., Encouraged aerobic exercise., Discussed foot care., Reminded to get yearly retinal  exam.  Other specified hypothyroidism Hypothyroidism-check TSH level, continue medications the same, reminded to take on an empty stomach 30-19mins before food.   Morbid Obesity with co morbidities - long discussion about weight loss, diet, and exercise  Gastroesophageal reflux disease, esophagitis presence not specified Continue PPI/H2 blocker, diet discussed  Pure hypercholesterolemia -continue medications, check lipids, decrease fatty foods, increase activity.   Iron deficiency anemia, unspecified iron deficiency anemia type Check cbc  History of colonic polyps  Tobacco abuse States quit 07/2016 but other smokers in the house  Vitamin D deficiency  Medication management  Current use of long term anticoagulation  Aortic atherosclerosis (HCC) Control blood pressure, cholesterol, glucose, increase exercise.   Over 30 minutes of exam, counseling, chart review, and critical decision making was performed  Plan:   During the course of the visit the patient was educated and counseled about appropriate screening and preventive services including:    Pneumococcal vaccine   Influenza vaccine  Td vaccine  Prevnar 13  Screening electrocardiogram  Screening mammography  Bone densitometry screening  Colorectal cancer screening  Diabetes screening  Glaucoma screening  Nutrition counseling   Advanced directives: given info/requested copies   Subjective:   Jillian Wood is a 71 y.o. female who presents for Medicare Annual Wellness Visit and 3 month follow up on hypertension, prediabetes, hyperlipidemia, vitamin D def.   Her blood pressure has been controlled at home, today their BP is BP: 124/80 She does not workout. She denies chest pain, shortness of breath, dizziness.  Patient has severe end stage COPD with CO2 retention, she is on 3-6 L of O2 depending on activity level and on diamox for CO2 retention however she is on 8 L of  02 at this time and still  feels that she is not getting enough air x 3 days, stating at 87-90 on 8L. She walks with walker or is in wheel chair, no falls in past year. She is currently on chantix, trying to quit, her husband, richard is not being sportive. Sleep study scheduled for next month.  Wt Readings from Last 3 Encounters:  12/09/16 255 lb (115.7 kg)  12/06/16 255 lb (115.7 kg)  11/19/16 246 lb (111.6 kg)   She is on coumadin due to Afib, she is on 7.5mg  T,T and 5mg  the rest of the week.  Lab Results  Component Value Date   INR 1.5 (H) 11/19/2016   INR 1.76 11/15/2016   INR 1.40 11/14/2016   She is unable to do house work due to her COPD.  She is on cholesterol medication and denies myalgias. Her cholesterol is at goal. The cholesterol last visit was:   Lab Results  Component Value Date   CHOL 180 06/06/2016   HDL 75 06/06/2016   LDLCALC 68 06/06/2016   TRIG 187 (H) 06/06/2016   CHOLHDL 2.4 06/06/2016   She has been working on diet and exercise for prediabetes, and denies foot ulcerations, hyperglycemia, hypoglycemia , increased appetite, nausea, paresthesia of the feet, polydipsia, polyuria, visual disturbances, vomiting and weight loss. Last A1C in the office was:  Lab Results  Component Value Date   HGBA1C 5.2 06/06/2016   Last GFR Lab Results  Component Value Date   GFRNONAA 70 11/19/2016   Patient is on Vitamin D supplement. Lab Results  Component Value Date   VD25OH 45 02/29/2016   She is on thyroid medication. Her medication was changed last visit.   Lab Results  Component Value Date   TSH 6.576 (H) 11/12/2016  .  BMI is Body mass index is 39.94 kg/m., she is working on diet and exercise. Wt Readings from Last 3 Encounters:  12/09/16 255 lb (115.7 kg)  12/06/16 255 lb (115.7 kg)  11/19/16 246 lb (111.6 kg)    Medication Review Current Outpatient Prescriptions on File Prior to Visit  Medication Sig Dispense Refill  . acetaminophen-codeine (TYLENOL #3) 300-30 MG tablet Take 1  tablet by mouth every 8 (eight) hours as needed for moderate pain or severe pain. 90 tablet 0  . acetaZOLAMIDE (DIAMOX) 125 MG tablet Take 1 tablet 3 x/ day (Patient taking differently: Take 125 mg by mouth 3 (three) times daily. ) 270 tablet 1  . albuterol (PROVENTIL HFA;VENTOLIN HFA) 108 (90 Base) MCG/ACT inhaler Inhale 1-2 puffs into the lungs every 6 (six) hours as needed for wheezing or shortness of breath. 18 g 2  . aspirin 81 MG tablet Take 81 mg by mouth daily.    . bisoprolol (ZEBETA) 10 MG tablet Take 1 tablet (10 mg total) by mouth daily. 30 tablet 0  . budesonide-formoterol (SYMBICORT) 160-4.5 MCG/ACT inhaler 2 puffs twice a day, wash mouth afterwards (Patient taking differently: Inhale 2 puffs into the lungs 2 (two) times daily. ) 1 Inhaler 0  . Cholecalciferol (VITAMIN D PO) Take 2,000 Units by mouth daily.     Marland Kitchen diltiazem (CARDIZEM CD) 300 MG 24 hr capsule Take 1 capsule (300 mg total) by mouth daily. 30 capsule 2  . DULoxetine (CYMBALTA) 60 MG capsule TAKE 1 CAPSULE BY MOUTH DAILY 90 capsule 1  . famotidine (PEPCID) 20 MG tablet TAKE 1 TABLET (20 MG TOTAL) BY MOUTH 2 (TWO) TIMES DAILY. FOR ACID REFLUX 180 tablet 0  .  ferrous sulfate 325 (65 FE) MG tablet Take 325 mg by mouth 2 (two) times daily with a meal.    . fluticasone (FLONASE) 50 MCG/ACT nasal spray Place 2 sprays into both nostrils daily. (Patient taking differently: Place 2 sprays into both nostrils daily as needed for allergies. ) 16 g 0  . furosemide (LASIX) 80 MG tablet Take 1 tablet (80 mg total) by mouth daily. (Patient taking differently: Take 40-80 mg by mouth daily as needed for fluid. ) 90 tablet 0  . guaiFENesin (MUCINEX) 600 MG 12 hr tablet Take 600 mg by mouth daily.    Marland Kitchen levothyroxine (SYNTHROID, LEVOTHROID) 175 MCG tablet TAKE 1 TABLET (175 MCG TOTAL) BY MOUTH DAILY BEFORE BREAKFAST. 90 tablet 0  . loratadine-pseudoephedrine (CLARITIN-D 24-HOUR) 10-240 MG per 24 hr tablet Take 1 tablet by mouth as needed for  allergies.     Marland Kitchen losartan (COZAAR) 50 MG tablet TAKE 1 TABLET BY MOUTH DAILY 90 tablet 2  . Magnesium Chloride (MAGNESIUM DR PO) Take 1 capsule by mouth daily.     . metFORMIN (GLUCOPHAGE-XR) 500 MG 24 hr tablet TAKE 1 TABLET (500 MG TOTAL) BY MOUTH 4 (FOUR) TIMES DAILY - AFTER MEALS AND AT BEDTIME. 360 tablet 1  . OXYGEN-HELIUM IN Inhale 5 L into the lungs.     . pravastatin (PRAVACHOL) 40 MG tablet TAKE 1 TABLET (40 MG TOTAL) BY MOUTH DAILY. 90 tablet 1  . warfarin (COUMADIN) 5 MG tablet Take 1 to 1.5 tablets daily or as directed. 90 tablet 1   No current facility-administered medications on file prior to visit.     Current Problems (verified) Patient Active Problem List   Diagnosis Date Noted  . Aortic atherosclerosis ([place]) 12/09/2016  . Typical atrial flutter ([place])   . Current use of long term anticoagulation 08/30/2016  . Dilated cardiomyopathy ([place])   . Pulmonary hypertension   . Diabetes mellitus type 2 in obese ([place])   . Acute on chronic respiratory failure with hypoxia ([place]) 07/31/2016  . Atrial fibrillation with RVR ([place]) 07/31/2016  . GERD (gastroesophageal reflux disease) 07/31/2015  . Tobacco abuse 10/18/2014  . Acute on chronic diastolic CHF (congestive heart failure) ([place]) 10/18/2014  . Chronic obstructive pulmonary disease ([place]) 09/21/2014  . Paroxysmal SVT (supraventricular tachycardia) ([place]) 08/04/2014  . Hypothyroidism 02/22/2014  . Medication management 11/06/2013  . Essential hypertension   . Hyperlipidemia   . Morbid obesity ([place])   . Vitamin D deficiency   . 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 Iron deficiency anemia 07/11/2010  . History of colonic polyps 07/11/2010    Screening Tests Immunization History  Administered Date(s) Administered  . Influenza, High Dose Seasonal PF 08/16/2014, 07/27/2015, 06/06/2016  . Pneumococcal Conjugate-13 07/27/2015  . Pneumococcal-Unspecified 05/16/2011  . Tdap 08/16/2008    Preventative care: Last  colonoscopy: 2011 Last mammogram:  11/2014 [id number]  Prior vaccinations: TD or Tdap: 2009  Influenza: 2016  Pneumococcal: 2015 Prevnar13: 2016 Shingles/Zostavax: Declined   Names of Other Physician/Practitioners you currently use: 1. [facility]- here for primary care 2. Has not seen, eye doctor, last visit 2016 3. Doesn't see, dentist, last visit remote, dentures Patient Care Team: Unk Pinto, MD as PCP - General (Internal Medicine) Evans Lance, MD as Consulting Physician (Cardiology) Lorretta Harp, MD as Consulting Physician (Cardiology) Inda Castle, MD as Consulting Physician (Gastroenterology) Wallene Huh, DPM as Consulting Physician (Podiatry)  Allergies Allergies  Allergen Reactions  . Inderal [  Propranolol] Other (See Comments)    Hair loss  . Ace Inhibitors Cough    SURGICAL HISTORY She  has a past surgical history that includes Carpal tunnel release; Eye surgery (Bilateral); Pubovaginal sling; Tonsillectomy and adenoidectomy; transthoracic echocardiogram (01/2011); and NM MYOVIEW LTD (01/2011). FAMILY HISTORY Her family history includes Alcohol abuse in her father; Heart disease in her father. SOCIAL HISTORY She  reports that she quit smoking about 3 months ago. Her smoking use included Cigarettes. She has a 22.00 pack-year smoking history. She has never used smokeless tobacco. She reports that she does not drink alcohol or use drugs.  MEDICARE WELLNESS OBJECTIVES: Physical activity:   Cardiac risk factors:   Depression/mood screen:   Depression screen [facility] 2/9 11/11/2016  Decreased Interest 0  Down, Depressed, Hopeless 0  PHQ - 2 Score 0    ADLs:  In your present state of health, do you have any difficulty performing the following activities: 11/11/2016 08/13/2016  Hearing? N N  Vision? N N  Difficulty concentrating or making decisions? N N  Walking or climbing stairs? Y N  Dressing or bathing? Y N  Doing  errands, shopping? Y N  Preparing Food and eating ? - -  Using the Toilet? - -  In the past six months, have you accidently leaked urine? - -  Do you have problems with loss of bowel control? - -  Managing your Medications? - -  Managing your Finances? - -  Housekeeping or managing your Housekeeping? - -  Some recent data might be hidden    Cognitive Testing  Alert? Yes  Normal Appearance?Yes  Oriented to person? Yes  Place? Yes   Time? Yes  Recall of three objects?  Yes  Can perform simple calculations? Yes  Displays appropriate judgment?Yes  Can read the correct time from a watch face?Yes  EOL planning: Does Patient Have a Medical Advance Directive?: No  Has papers at home but needs filled out/notarized  Objective:   Today's Vitals   12/09/16 1602  BP: 124/80  Pulse: (!) 102  Temp: 97.3 F (36.3 C)  SpO2: 90%  Weight: 255 lb (115.7 kg)  Height: 5\' 7"  (1.702 m)  PainSc: 8   PainLoc: Back   Body mass index is 39.94 kg/m.   Wt Readings from Last 3 Encounters:  12/09/16 255 lb (115.7 kg)  12/06/16 255 lb (115.7 kg)  11/19/16 246 lb (111.6 kg)     General appearance: alert, no distress, WD/WN,  female HEENT: normocephalic, sclerae anicteric, TMs pearly, nares patent, no discharge or erythema, pharynx normal Oral cavity: MMM, no lesions Neck: supple, no lymphadenopathy, no thyromegaly, no masses Heart: Irreg irreg, normal S1, S2, systolic murmur Lungs: decreased breath sounds, wheezing and possible rhonchi, no rales, 8 L O2 at 87% Abdomen: +bs, soft, non tender, non distended, no masses, no hepatomegaly, no splenomegaly Musculoskeletal: nontender, no swelling, no obvious deformity Extremities: no edema, no cyanosis, no clubbing Pulses: 2+ symmetric, upper and lower extremities, normal cap refill Neurological: alert, oriented x 3, CN2-12 intact, strength normal upper extremities and lower extremities,  DTRs 2+ throughout, no cerebellar signs, patient in wheelchair  due to SOB Psychiatric: normal affect, behavior normal, pleasant  Breast: defer Gyn: defer Rectal: defer   Medicare Attestation I have personally reviewed: The patient's medical and social history Their use of alcohol, tobacco or illicit drugs Their current medications and supplements The patient's functional ability including ADLs,fall risks, home safety risks, cognitive, and hearing and visual impairment Diet and  physical activities Evidence for depression or mood disorders  The patient's weight, height, BMI, and visual acuity have been recorded in the chart.  I have made referrals, counseling, and provided education to the patient based on review of the above and I have provided the patient with a written personalized care plan for preventive services.     [name], PA-C   12/09/2016

## 2016-12-09 NOTE — ED Provider Notes (Signed)
[facility] DEPT Provider Note   CSN: [id number] Arrival date & time: 12/09/16  1723     History   Chief Complaint Chief Complaint  Patient presents with  . Shortness of Breath    HPI Jillian Wood is a 71 y.o. female.  The history is provided by the patient, a relative and medical records.  Shortness of Breath  This is a chronic problem. The average episode lasts 3 days. The problem occurs continuously.The current episode started more than 2 days ago. The problem has been gradually worsening. Associated symptoms include orthopnea. Pertinent negatives include no fever, no headaches, no rhinorrhea, no sore throat, no cough, no wheezing, no chest pain, no syncope, no vomiting, no abdominal pain, no rash, no leg pain, no leg swelling and no claudication. She has had prior hospitalizations. She has had prior ED visits. Associated medical issues include COPD, chronic lung disease and heart failure. Associated medical issues do not include DVT or recent surgery.     PMH Atrial flutter and PSVT (on warfarin), CIOPD (5L [place]), DM2, HTN, morbid obesity presents for increased dyspnea at rest worsening over three days. Normally on 5L [place] which patient has increased to 8L. No increase in weight or leg edema. Went to PCP today for INR check and found to be sating 87% on 8L. Denies chest pain, N/V, back pain, abdominal pain.   Past Medical History:  Diagnosis Date  . Arthritis   . CHF (congestive heart failure) ([place])   . COPD (chronic obstructive pulmonary disease) ([place])   . Depression   . Diabetes mellitus type 2 in obese ([place])   . GERD (gastroesophageal reflux disease)   . Hyperlipidemia   . Hypertension   . Morbid obesity ([place])   . OAB (overactive bladder)   . PSVT (paroxysmal supraventricular tachycardia) ([place])   . Thyroid disease   . Vitamin D deficiency     Patient Active Problem List   Diagnosis Date Noted  . Aortic atherosclerosis ([place]) 12/09/2016  . Typical atrial flutter  ([place])   . Current use of long term anticoagulation 08/30/2016  . Dilated cardiomyopathy ([place])   . Pulmonary hypertension   . Diabetes mellitus type 2 in obese ([place])   . Acute on chronic respiratory failure with hypoxia ([place]) 07/31/2016  . COPD with acute exacerbation ([place]) 07/31/2016  . Atrial flutter with rapid ventricular response ([place]) 07/31/2016  . GERD (gastroesophageal reflux disease) 07/31/2015  . Tobacco abuse 10/18/2014  . Acute on chronic diastolic CHF (congestive heart failure) ([place]) 10/18/2014  . Paroxysmal SVT (supraventricular tachycardia) ([place]) 08/04/2014  . Hypothyroidism 02/22/2014  . Medication management 11/06/2013  . Essential hypertension   . Hyperlipidemia   . Morbid obesity ([place])   . Vitamin D deficiency   . 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 Iron deficiency anemia 07/11/2010  . History of colonic polyps 07/11/2010    Past Surgical History:  Procedure Laterality Date  . CARPAL TUNNEL RELEASE     L 1992 R 1984  . EYE SURGERY Bilateral    cataract  . NM MYOVIEW LTD  01/2011   Dobutamine Myoview: Negative perfusion scan for ischemia / infarct (poor image capture); Pt developed SVT with Dobutamine that reproduced her CP as it resolved with restoration of NSR.  Marland Kitchen PUBOVAGINAL SLING    . TONSILLECTOMY AND ADENOIDECTOMY    . TRANSTHORACIC ECHOCARDIOGRAM  01/2011   Hyperdynamic LV with EF 65-70%, Gr 1 DD, Mild Ao Stenosis - mean gradient ~19 mmHg  OB History    No data available       Home Medications    Prior to Admission medications   Medication Sig Start Date End Date Taking? Authorizing Provider  acetaminophen-codeine (TYLENOL #3) 300-30 MG tablet Take 1 tablet by mouth every 8 (eight) hours as needed for moderate pain or severe pain. 11/19/16  Yes [name], PA-C  acetaZOLAMIDE (DIAMOX) 125 MG tablet Take 1 tablet 3 x/ day Patient taking differently: Take 125 mg by mouth 3 (three) times daily.  08/13/16 02/10/17 Yes Unk Pinto, MD    albuterol (PROVENTIL HFA;VENTOLIN HFA) 108 (90 Base) MCG/ACT inhaler Inhale 1-2 puffs into the lungs every 6 (six) hours as needed for wheezing or shortness of breath. 06/06/16  Yes Courtney Forcucci, PA-C  aspirin 81 MG tablet Take 81 mg by mouth daily.   Yes Historical Provider, MD  bisoprolol (ZEBETA) 10 MG tablet Take 1 tablet (10 mg total) by mouth daily. 11/16/16  Yes Shanker Kristeen Mans, MD  budesonide-formoterol (SYMBICORT) 160-4.5 MCG/ACT inhaler 2 puffs twice a day, wash mouth afterwards Patient taking differently: Inhale 2 puffs into the lungs 2 (two) times daily.  08/11/16  Yes Doreatha Lew, MD  Cholecalciferol (VITAMIN D PO) Take 2,000 Units by mouth daily.    Yes Historical Provider, MD  diltiazem (CARDIZEM CD) 300 MG 24 hr capsule Take 1 capsule (300 mg total) by mouth daily. 09/13/16  Yes Unk Pinto, MD  DULoxetine (CYMBALTA) 60 MG capsule TAKE 1 CAPSULE BY MOUTH DAILY 08/24/16  Yes Unk Pinto, MD  famotidine (PEPCID) 20 MG tablet TAKE 1 TABLET (20 MG TOTAL) BY MOUTH 2 (TWO) TIMES DAILY. FOR ACID REFLUX 09/19/16  Yes Unk Pinto, MD  ferrous sulfate 325 (65 FE) MG tablet Take 325 mg by mouth 2 (two) times daily with a meal.   Yes Historical Provider, MD  fluticasone (FLONASE) 50 MCG/ACT nasal spray Place 2 sprays into both nostrils daily. Patient taking differently: Place 2 sprays into both nostrils daily as needed for allergies.  10/29/16  Yes Courtney Forcucci, PA-C  furosemide (LASIX) 80 MG tablet Take 1 tablet (80 mg total) by mouth daily. 09/17/16  Yes Unk Pinto, MD  guaiFENesin (MUCINEX) 600 MG 12 hr tablet Take 600 mg by mouth 2 (two) times daily.    Yes Historical Provider, MD  levothyroxine (SYNTHROID, LEVOTHROID) 175 MCG tablet TAKE 1 TABLET (175 MCG TOTAL) BY MOUTH DAILY BEFORE BREAKFAST. 09/18/16  Yes [name], PA-C  loratadine-pseudoephedrine (CLARITIN-D 24-HOUR) 10-240 MG per 24 hr tablet Take 1 tablet by mouth as needed for allergies.    Yes  Historical Provider, MD  losartan (COZAAR) 50 MG tablet TAKE 1 TABLET BY MOUTH DAILY 09/29/16  Yes [name], PA-C  Magnesium Chloride (MAGNESIUM DR PO) Take 1 capsule by mouth daily.    Yes Historical Provider, MD  metFORMIN (GLUCOPHAGE-XR) 500 MG 24 hr tablet TAKE 1 TABLET (500 MG TOTAL) BY MOUTH 4 (FOUR) TIMES DAILY - AFTER MEALS AND AT BEDTIME. 10/14/16  Yes Unk Pinto, MD  OXYGEN-HELIUM IN Inhale 5-6 L into the lungs continuous.    Yes Historical Provider, MD  pravastatin (PRAVACHOL) 40 MG tablet TAKE 1 TABLET (40 MG TOTAL) BY MOUTH DAILY. 03/07/16  Yes Unk Pinto, MD  warfarin (COUMADIN) 5 MG tablet Take 1 to 1.5 tablets daily or as directed. Patient taking differently: Take 5-7.5 mg by mouth daily at 6 PM. TAKES 7.5MG  ON TUES AND THURS TAKES 5MG  ALL OTHER DAYS 12/04/16  Yes Unk Pinto, MD  Family History Family History  Problem Relation Age of Onset  . Alcohol abuse Father   . Heart disease Father     Social History Social History  Substance Use Topics  . Smoking status: Former Smoker    Packs/day: 0.50    Years: 44.00    Types: Cigarettes    Quit date: 08/15/2016  . Smokeless tobacco: Never Used     Comment: smoke about 5-6 cigarette daily  . Alcohol use No     Allergies   Inderal [propranolol] and Ace inhibitors   Review of Systems Review of Systems  Constitutional: Negative for chills and fever.  HENT: Negative for rhinorrhea and sore throat.   Respiratory: Positive for shortness of breath. Negative for cough and wheezing.   Cardiovascular: Positive for orthopnea. Negative for chest pain, claudication, leg swelling and syncope.  Gastrointestinal: Negative for abdominal pain and vomiting.  Musculoskeletal: Negative for back pain and myalgias.  Skin: Negative for rash.  Neurological: Negative for light-headedness and headaches.  All other systems reviewed and are negative.    Physical Exam Updated Vital Signs BP 119/72   Pulse (!) 112    Temp 97.8 F (36.6 C) (Axillary)   Resp 20   Ht 5\' 6"  (1.676 m)   Wt 117.4 kg   SpO2 92%   BMI 41.79 kg/m   Physical Exam  Constitutional: She is oriented to person, place, and time.  Appears chronically ill  HENT:  Head: Normocephalic and atraumatic.  Eyes: Conjunctivae and EOM are normal.  Neck: Normal range of motion. Neck supple.  Cardiovascular: An irregularly irregular rhythm present. Tachycardia present.   No murmur heard. Pulmonary/Chest: Effort normal. No accessory muscle usage. No respiratory distress. She has decreased breath sounds.  Abdominal: Soft. There is no tenderness.  Musculoskeletal: She exhibits edema (1+ pre-tibial pitting edema).  Neurological: She is alert and oriented to person, place, and time.  Skin: Skin is warm and dry. Capillary refill takes less than 2 seconds.  Psychiatric: She has a normal mood and affect.  Nursing note and vitals reviewed.    ED Treatments / Results  Labs (all labs ordered are listed, but only abnormal results are displayed) Labs Reviewed  BASIC METABOLIC PANEL - Abnormal; Notable for the following:       Result Value   Chloride 99 (*)    Glucose, Bld 129 (*)    All other components within normal limits  CBC - Abnormal; Notable for the following:    Hemoglobin 10.1 (*)    MCH 23.1 (*)    MCHC 27.9 (*)    RDW 18.5 (*)    All other components within normal limits  PROTIME-INR - Abnormal; Notable for the following:    Prothrombin Time 20.0 (*)    All other components within normal limits  BRAIN NATRIURETIC PEPTIDE - Abnormal; Notable for the following:    B Natriuretic Peptide 472.3 (*)    All other components within normal limits  GLUCOSE, CAPILLARY - Abnormal; Notable for the following:    Glucose-Capillary 198 (*)    All other components within normal limits  MRSA PCR SCREENING  BASIC METABOLIC PANEL  PROTIME-INR  I-STAT TROPOININ, ED    EKG  EKG Interpretation  Date/Time:  Monday December 09 2016 17:29:39  EDT Ventricular Rate:  106 PR Interval:    QRS Duration: 100 QT Interval:  294 QTC Calculation: 390 R Axis:   134 Text Interpretation:  Atrial flutter with variable A-V block with premature ventricular or  aberrantly conducted complexes Right axis deviation Incomplete right bundle branch block Possible Right ventricular hypertrophy Nonspecific ST and T wave abnormality Abnormal ekg Confirmed by Carmin Muskrat  MD [phone]) on 12/09/2016 5:55:37 PM       Radiology Dg Chest Portable 1 View  Result Date: 12/09/2016 CLINICAL DATA:  71 year old female with shortness of breath and cough for 2-3 days. Initial encounter. EXAM: PORTABLE CHEST 1 VIEW COMPARISON:  11/11/2016 07/31/2016. FINDINGS: Pulmonary edema superimposed upon chronic changes. Right-sided pleural effusion suspected. Basilar atelectasis greater on the right. Cannot exclude basilar infiltrate or mass. Cardiomegaly. Calcified aorta. Shoulder joint degenerative change greater on left. IMPRESSION: Pulmonary edema superimposed upon chronic changes. Right-sided pleural effusion suspected. Basilar atelectasis greater on the right. Cannot exclude basilar infiltrate or mass. Cardiomegaly. Electronically Signed   By: Genia Del M.D.   On: 12/09/2016 18:13    Procedures Procedures (including critical care time)  Medications Ordered in ED Medications  acetaminophen-codeine (TYLENOL #3) 300-30 MG per tablet 1 tablet (not administered)  bisoprolol (ZEBETA) tablet 10 mg (not administered)  ferrous sulfate tablet 325 mg (not administered)  fluticasone (FLONASE) 50 MCG/ACT nasal spray 2 spray (not administered)  losartan (COZAAR) tablet 50 mg (not administered)  famotidine (PEPCID) tablet 20 mg (20 mg Oral Given 12/10/16 0005)  levothyroxine (SYNTHROID, LEVOTHROID) tablet 175 mcg (not administered)  DULoxetine (CYMBALTA) DR capsule 60 mg (not administered)  mometasone-formoterol (DULERA) 200-5 MCG/ACT inhaler 2 puff (2 puffs Inhalation Not Given  12/09/16 2335)  pravastatin (PRAVACHOL) tablet 40 mg (not administered)  guaiFENesin (MUCINEX) 12 hr tablet 600 mg (600 mg Oral Given 12/10/16 0005)  aspirin chewable tablet 81 mg (not administered)  diltiazem (CARDIZEM) tablet 90 mg (not administered)  sodium chloride flush (NS) 0.9 % injection 3 mL (3 mLs Intravenous Given 12/10/16 0007)  sodium chloride flush (NS) 0.9 % injection 3 mL (not administered)  0.9 %  sodium chloride infusion (250 mLs Intravenous New Bag/Given 12/10/16 0138)  acetaminophen (TYLENOL) tablet 650 mg (not administered)  ondansetron (ZOFRAN) injection 4 mg (not administered)  furosemide (LASIX) injection 60 mg (60 mg Intravenous Given 12/10/16 0041)  insulin aspart (novoLOG) injection 0-15 Units (not administered)  insulin aspart (novoLOG) injection 0-5 Units (0 Units Subcutaneous Not Given 12/09/16 2341)  ipratropium-albuterol (DUONEB) 0.5-2.5 (3) MG/3ML nebulizer solution 3 mL (3 mLs Nebulization Given 12/10/16 0129)  methylPREDNISolone sodium succinate (SOLU-MEDROL) 125 mg/2 mL injection 60 mg (60 mg Intravenous Given 12/10/16 0006)  ipratropium-albuterol (DUONEB) 0.5-2.5 (3) MG/3ML nebulizer solution 3 mL (not administered)  azithromycin (ZITHROMAX) 500 mg in dextrose 5 % 250 mL IVPB (500 mg Intravenous Given 12/10/16 0136)  diltiazem (CARDIZEM) 1 mg/mL load via infusion 10 mg (10 mg Intravenous Bolus from Bag 12/10/16 0006)    And  diltiazem (CARDIZEM) 100 mg in dextrose 5% 151mL (1 mg/mL) infusion (5 mg/hr Intravenous New Bag/Given 12/10/16 0007)  Warfarin - Pharmacist Dosing Inpatient (not administered)  ipratropium-albuterol (DUONEB) 0.5-2.5 (3) MG/3ML nebulizer solution 3 mL (3 mLs Nebulization Given 12/09/16 1840)  warfarin (COUMADIN) tablet 7.5 mg (7.5 mg Oral Given 12/10/16 0005)     Initial Impression / Assessment and Plan / ED Course  I have reviewed the triage vital signs and the nursing notes.  Pertinent labs & imaging results that were available during my  care of the patient were reviewed by me and considered in my medical decision making (see chart for details).    71 y.o. female presents for acute on chronic dyspnea and increased oxygen requirement. Sating mid 86s  on 8L [place], decreased air movement in all fields, moderate peripheral edema. Able to speak in full sentences and states she feels better. HR 100-120s, EKG atrial flutter with incomplete RBBB c/w previous studies. Concern for CHF vs COPD exacerbation. Doubt PE or ACS. Given duoneb and placed on BiPAP.  Elevated BNP 472. Trop negative. CXR with pulmonary edema and chronic lung changes, difficult to assess for consolidation. No infectious symptoms. No leukocytosis. BMP wnl. Will admit for further care.   Final Clinical Impressions(s) / ED Diagnoses   Final diagnoses:  COPD exacerbation [facility])  Hypoxia    New Prescriptions Current Discharge Medication List       Monico Blitz, MD 12/10/16 0144    Carmin Muskrat, MD 12/10/16 1013

## 2016-12-09 NOTE — ED Triage Notes (Signed)
PT sent here by MD for O2 of 87% on 8L (Normally on 5).  A-flutter in triage.  Pt states for past few days she has been feeling more sob with ambulation.

## 2016-12-09 NOTE — Progress Notes (Signed)
ANTICOAGULATION CONSULT NOTE - Pharmacy Consult for warfarin  Indication: atrial fibrillation  Allergies  Allergen Reactions  . Inderal [Propranolol] Other (See Comments)    Hair loss  . Ace Inhibitors Cough    Patient Measurements: Height: 5\' 6"  (167.6 cm) Weight: 255 lb (115.7 kg) IBW/kg (Calculated) : 59.3   Vital Signs: Temp: 98.8 F (37.1 C) (03/26 1735) Temp Source: Oral (03/26 1735) BP: 103/89 (03/26 2130) Pulse Rate: 130 (03/26 2130)  Labs:  Recent Labs  12/09/16 1750  HGB 10.1*  HCT 36.2  PLT 241  LABPROT 20.0*  INR 1.68  CREATININE 0.65    Estimated Creatinine Clearance: 84.6 mL/min (by C-G formula based on SCr of 0.65 mg/dL).  Assessment: 71 yo female on warfarin PTA for atrial fibrillation. INR on admission subtherapeutic at 1.68 with last dose taken on 3/25. CBC stable.   Goal of Therapy:  INR 2-3 Monitor platelets by anticoagulation protocol: Yes   Plan:  1. Coumadin 7.5 mg x 1 now 2. Daily INR  Vincenza Hews, PharmD, BCPS 12/09/2016, 10:35 PM

## 2016-12-09 NOTE — ED Notes (Addendum)
RT at bedside setting up bipap

## 2016-12-09 NOTE — Patient Instructions (Addendum)
[facility]  7 a.m.-6:30 p.m., Monday 7 a.m.-5 p.m., Tuesday-Friday Schedule an appointment by calling [phone].   Chronic Obstructive Pulmonary Disease Exacerbation Chronic obstructive pulmonary disease (COPD) is a common lung problem. In COPD, the flow of air from the lungs is limited. COPD exacerbations are times that breathing gets worse and you need extra treatment. Without treatment they can be life threatening. If they happen often, your lungs can become more damaged. If your COPD gets worse, your doctor may treat you with:  Medicines.  Oxygen.  Different ways to clear your airway, such as using a mask. Follow these instructions at home:  Do not smoke.  Avoid tobacco smoke and other things that bother your lungs.  If given, take your antibiotic medicine as told. Finish the medicine even if you start to feel better.  Only take medicines as told by your doctor.  Drink enough fluids to keep your pee (urine) clear or pale yellow (unless your doctor has told you not to).  Use a cool mist machine (vaporizer).  If you use oxygen or a machine that turns liquid medicine into a mist (nebulizer), continue to use them as told.  Keep up with shots (vaccinations) as told by your doctor.  Exercise regularly.  Eat healthy foods.  Keep all doctor visits as told. Get help right away if:  You are very short of breath and it gets worse.  You have trouble talking.  You have bad chest pain.  You have blood in your spit (sputum).  You have a fever.  You keep throwing up (vomiting).  You feel weak, or you pass out (faint).  You feel confused.  You keep getting worse. This information is not intended to replace advice given to you by your health care provider. Make sure you discuss any questions you have with your health care provider. Document Released: 08/22/2011 Document Revised: 02/08/2016 Document Reviewed: 05/07/2013 Elsevier Interactive  Patient Education  2017 [employer].

## 2016-12-09 NOTE — Progress Notes (Signed)
   12/09/16 2251  BiPAP/CPAP/SIPAP  BiPAP/CPAP/SIPAP Pt Type Adult  Mask Type Full face mask  Mask Size Large  Set Rate 12 breaths/min  Respiratory Rate 23 breaths/min  IPAP 141 cmH20  EPAP 6 cmH2O  Oxygen Percent 50 %  Flow Rate 0 lpm  Minute Ventilation 11.7  Leak 15  Peak Inspiratory Pressure (PIP) 15  Tidal Volume (Vt) 445  BiPAP/CPAP/SIPAP BiPAP  Patient Home Equipment No  Auto Titrate No  Press High Alarm 25 cmH2O  Press Low Alarm 5 cmH2O  BiPAP/CPAP /SiPAP Vitals  Pulse Rate (!) 108  Resp (!) 28  SpO2 95 %  Patient requested to go back on Bipap

## 2016-12-09 NOTE — H&P (Signed)
History and Physical    Jillian Wood:[id number] DOB: 1946-02-06 DOA: 12/09/2016  PCP: Alesia Richards, MD   Patient coming from: Home, by way of PCP office  Chief Complaint: SOB, hypoxic despite turning up her FiO2   HPI: Jillian Wood is a 71 y.o. female with medical history significant for COPD, chronic hypoxic respiratory failure, chronic diastolic CHF, hypertension, hypothyroidism, and GERD who presents to the ED at the direction of her primary care physician for evaluation of dyspnea and hypoxia. Patient reports that she was in her usual state until approximately 2-3 days ago when she developed insidious worsening in her chronic dyspnea. She reports worsening fairly rapidly, now becoming dyspneic just moving around in bed. She noted her O2 saturations at home to be in the upper 80s despite turning her home O2 up to 8 L/m from her usual 5 L/m. She reports an increased cough productive of thick clear sputum. She denies fevers or chills per se, but reports that she's been feeling as though she was "about to get a fever." She denies significant increase in lower extremity swelling. She denies chest pain or palpitations, denies long distant travel or recent sick contacts, denies any recent change in her medications. She was evaluated by her PCP, found to be hypoxic and working to breathe, and directed to the ED.  ED Course: Upon arrival to the ED, patient is found to be afebrile, saturating adequately on 8 L/m of supplemental oxygen, tachypneic, and tachycardic in the 110's. EKG features in atrial flutter with PVC, incomplete right bundle-branch block, and nonspecific repolarization abnormality. Chest x-ray is notable for cardiomegaly with pulmonary edema superimposed on chronic changes as well as suspected right-sided pleural effusion. Chemistry panel is unremarkable and CBC is notable for a stable normocytic anemia with hemoglobin 10.1. INR subtherapeutic at 1.7, troponin is  undetectable, and BNP is elevated to 472. Patient was treated with DuoNeb in the ED and placed on BiPAP. She reported significant improvement with the breathing treatment and BiPAP. She'll be admitted to the stepdown unit for ongoing evaluation and management of acute on chronic hypoxic respiratory failure suspected secondary to COPD with acute exacerbation, as well as suspected contribution from acute on chronic diastolic CHF.  Review of Systems:  All other systems reviewed and apart from HPI, are negative.  Past Medical History:  Diagnosis Date  . Arthritis   . CHF (congestive heart failure) ([place])   . COPD (chronic obstructive pulmonary disease) ([place])   . Depression   . Diabetes mellitus type 2 in obese ([place])   . GERD (gastroesophageal reflux disease)   . Hyperlipidemia   . Hypertension   . Morbid obesity ([place])   . OAB (overactive bladder)   . PSVT (paroxysmal supraventricular tachycardia) ([place])   . Thyroid disease   . Vitamin D deficiency     Past Surgical History:  Procedure Laterality Date  . CARPAL TUNNEL RELEASE     L 1992 R 1984  . EYE SURGERY Bilateral    cataract  . NM MYOVIEW LTD  01/2011   Dobutamine Myoview: Negative perfusion scan for ischemia / infarct (poor image capture); Pt developed SVT with Dobutamine that reproduced her CP as it resolved with restoration of NSR.  Marland Kitchen PUBOVAGINAL SLING    . TONSILLECTOMY AND ADENOIDECTOMY    . TRANSTHORACIC ECHOCARDIOGRAM  01/2011   Hyperdynamic LV with EF 65-70%, Gr 1 DD, Mild Ao Stenosis - mean gradient ~19 mmHg     reports that she quit smoking  about 3 months ago. Her smoking use included Cigarettes. She has a 22.00 pack-year smoking history. She has never used smokeless tobacco. She reports that she does not drink alcohol or use drugs.  Allergies  Allergen Reactions  . Inderal [Propranolol] Other (See Comments)    Hair loss  . Ace Inhibitors Cough    Family History  Problem Relation Age of Onset  . Alcohol abuse  Father   . Heart disease Father      Prior to Admission medications   Medication Sig Start Date End Date Taking? Authorizing Provider  acetaminophen-codeine (TYLENOL #3) 300-30 MG tablet Take 1 tablet by mouth every 8 (eight) hours as needed for moderate pain or severe pain. 11/19/16  Yes [name], PA-C  acetaZOLAMIDE (DIAMOX) 125 MG tablet Take 1 tablet 3 x/ day Patient taking differently: Take 125 mg by mouth 3 (three) times daily.  08/13/16 02/10/17 Yes Unk Pinto, MD  albuterol (PROVENTIL HFA;VENTOLIN HFA) 108 (90 Base) MCG/ACT inhaler Inhale 1-2 puffs into the lungs every 6 (six) hours as needed for wheezing or shortness of breath. 06/06/16  Yes Courtney Forcucci, PA-C  aspirin 81 MG tablet Take 81 mg by mouth daily.   Yes Historical Provider, MD  bisoprolol (ZEBETA) 10 MG tablet Take 1 tablet (10 mg total) by mouth daily. 11/16/16  Yes Shanker Kristeen Mans, MD  budesonide-formoterol (SYMBICORT) 160-4.5 MCG/ACT inhaler 2 puffs twice a day, wash mouth afterwards Patient taking differently: Inhale 2 puffs into the lungs 2 (two) times daily.  08/11/16  Yes Doreatha Lew, MD  Cholecalciferol (VITAMIN D PO) Take 2,000 Units by mouth daily.    Yes Historical Provider, MD  diltiazem (CARDIZEM CD) 300 MG 24 hr capsule Take 1 capsule (300 mg total) by mouth daily. 09/13/16  Yes Unk Pinto, MD  DULoxetine (CYMBALTA) 60 MG capsule TAKE 1 CAPSULE BY MOUTH DAILY 08/24/16  Yes Unk Pinto, MD  famotidine (PEPCID) 20 MG tablet TAKE 1 TABLET (20 MG TOTAL) BY MOUTH 2 (TWO) TIMES DAILY. FOR ACID REFLUX 09/19/16  Yes Unk Pinto, MD  ferrous sulfate 325 (65 FE) MG tablet Take 325 mg by mouth 2 (two) times daily with a meal.   Yes Historical Provider, MD  fluticasone (FLONASE) 50 MCG/ACT nasal spray Place 2 sprays into both nostrils daily. Patient taking differently: Place 2 sprays into both nostrils daily as needed for allergies.  10/29/16  Yes Courtney Forcucci, PA-C  furosemide (LASIX) 80  MG tablet Take 1 tablet (80 mg total) by mouth daily. 09/17/16  Yes Unk Pinto, MD  guaiFENesin (MUCINEX) 600 MG 12 hr tablet Take 600 mg by mouth 2 (two) times daily.    Yes Historical Provider, MD  levothyroxine (SYNTHROID, LEVOTHROID) 175 MCG tablet TAKE 1 TABLET (175 MCG TOTAL) BY MOUTH DAILY BEFORE BREAKFAST. 09/18/16  Yes [name], PA-C  loratadine-pseudoephedrine (CLARITIN-D 24-HOUR) 10-240 MG per 24 hr tablet Take 1 tablet by mouth as needed for allergies.    Yes Historical Provider, MD  losartan (COZAAR) 50 MG tablet TAKE 1 TABLET BY MOUTH DAILY 09/29/16  Yes [name], PA-C  Magnesium Chloride (MAGNESIUM DR PO) Take 1 capsule by mouth daily.    Yes Historical Provider, MD  metFORMIN (GLUCOPHAGE-XR) 500 MG 24 hr tablet TAKE 1 TABLET (500 MG TOTAL) BY MOUTH 4 (FOUR) TIMES DAILY - AFTER MEALS AND AT BEDTIME. 10/14/16  Yes Unk Pinto, MD  OXYGEN-HELIUM IN Inhale 5-6 L into the lungs continuous.    Yes Historical Provider, MD  pravastatin (PRAVACHOL)  40 MG tablet TAKE 1 TABLET (40 MG TOTAL) BY MOUTH DAILY. 03/07/16  Yes Unk Pinto, MD  warfarin (COUMADIN) 5 MG tablet Take 1 to 1.5 tablets daily or as directed. Patient taking differently: Take 5-7.5 mg by mouth daily at 6 PM. TAKES 7.5MG  ON TUES AND THURS TAKES 5MG  ALL OTHER DAYS 12/04/16  Yes Unk Pinto, MD    Physical Exam: Vitals:   12/09/16 2011 12/09/16 2030 12/09/16 2106 12/09/16 2130  BP:  (!) 105/58 116/88 103/89  Pulse:  (!) 116 (!) 129 (!) 130  Resp:  (!) 28 (!) 23 (!) 28  Temp:      TempSrc:      SpO2: 98% 96% 94% 91%  Weight:      Height:          Constitutional: Obese. In respiratory distress with accessory muscle use and tachypnea. No pallor or diaphoresis. Eyes: PERTLA, lids and conjunctivae normal ENMT: Mucous membranes are moist. Posterior pharynx clear of any exudate or lesions.   Neck: normal, supple, no masses, no thyromegaly Respiratory: Breath sounds markedly diminished bilaterally.  Increased WOB. BiPAP in place.    Cardiovascular: Rate ~120 and irregular. 2+ pretibial edema bilaterally. JVP not well-visualized. Abdomen: No distension, no tenderness, no masses palpated. Bowel sounds normal.  Musculoskeletal: no clubbing / cyanosis. No joint deformity upper and lower extremities. Normal muscle tone.  Skin: no significant rashes, lesions, ulcers. Warm, dry, well-perfused. Neurologic: CN 2-12 grossly intact. Sensation intact, DTR normal. Strength 5/5 in all 4 limbs.  Psychiatric: Normal judgment and insight. Alert and oriented x 3. Normal mood and affect.     Labs on Admission: I have personally reviewed following labs and imaging studies  CBC:  Recent Labs Lab 12/09/16 1750  WBC 10.2  HGB 10.1*  HCT 36.2  MCV 82.6  PLT 443   Basic Metabolic Panel:  Recent Labs Lab 12/09/16 1750  NA 140  K 4.3  CL 99*  CO2 30  GLUCOSE 129*  BUN 10  CREATININE 0.65  CALCIUM 9.2   GFR: Estimated Creatinine Clearance: 84.6 mL/min (by C-G formula based on SCr of 0.65 mg/dL). Liver Function Tests: No results for input(s): AST, ALT, ALKPHOS, BILITOT, PROT, ALBUMIN in the last 168 hours. No results for input(s): LIPASE, AMYLASE in the last 168 hours. No results for input(s): AMMONIA in the last 168 hours. Coagulation Profile:  Recent Labs Lab 12/09/16 1750  INR 1.68   Cardiac Enzymes: No results for input(s): CKTOTAL, CKMB, CKMBINDEX, TROPONINI in the last 168 hours. BNP (last 3 results) No results for input(s): PROBNP in the last 8760 hours. HbA1C: No results for input(s): HGBA1C in the last 72 hours. CBG: No results for input(s): GLUCAP in the last 168 hours. Lipid Profile: No results for input(s): CHOL, HDL, LDLCALC, TRIG, CHOLHDL, LDLDIRECT in the last 72 hours. Thyroid Function Tests: No results for input(s): TSH, T4TOTAL, FREET4, T3FREE, THYROIDAB in the last 72 hours. Anemia Panel: No results for input(s): VITAMINB12, FOLATE, FERRITIN, TIBC, IRON,  RETICCTPCT in the last 72 hours. Urine analysis:    Component Value Date/Time   COLORURINE YELLOW 07/27/2015 1612   APPEARANCEUR CLEAR 07/27/2015 1612   LABSPEC 1.017 07/27/2015 1612   PHURINE 6.0 07/27/2015 1612   GLUCOSEU NEGATIVE 07/27/2015 1612   HGBUR 2+ (A) 07/27/2015 1612   BILIRUBINUR NEGATIVE 07/27/2015 1612   KETONESUR NEGATIVE 07/27/2015 1612   PROTEINUR NEGATIVE 07/27/2015 1612   UROBILINOGEN 1.0 10/23/2009 1145   NITRITE NEGATIVE 07/27/2015 1612   LEUKOCYTESUR NEGATIVE  07/27/2015 1612   Sepsis Labs: @LABRCNTIP (procalcitonin:4,lacticidven:4) )No results found for this or any previous visit (from the past 240 hour(s)).   Radiological Exams on Admission: Dg Chest Portable 1 View  Result Date: 12/09/2016 CLINICAL DATA:  71 year old female with shortness of breath and cough for 2-3 days. Initial encounter. EXAM: PORTABLE CHEST 1 VIEW COMPARISON:  11/11/2016 07/31/2016. FINDINGS: Pulmonary edema superimposed upon chronic changes. Right-sided pleural effusion suspected. Basilar atelectasis greater on the right. Cannot exclude basilar infiltrate or mass. Cardiomegaly. Calcified aorta. Shoulder joint degenerative change greater on left. IMPRESSION: Pulmonary edema superimposed upon chronic changes. Right-sided pleural effusion suspected. Basilar atelectasis greater on the right. Cannot exclude basilar infiltrate or mass. Cardiomegaly. Electronically Signed   By: Genia Del M.D.   On: 12/09/2016 18:13    EKG: Independently reviewed. Atrial flutter, PVC, incomplete RBBB, non-specific ST and T-wave abnormality.   Assessment/Plan  1. Acute on chronic hypoxic respiratory failure  - Pt presents with 2-3 days of progressive SOB and reports sats in mid-80's despite increasing her home O2 from her usual 5 Lpm to 8 Lpm  - Suspect this to be multifactorial; appears to be primarily an acute exacerbation in COPD with contribution from acute on chronic diastolic CHF, to be managed as below     2. COPD with acute exacerbation  - Possible infectious precipitant based on HPI, though no fever or leukocytosis noted  - Start systemic steroid with IV Solu-Medrol, start azithromycin, continue prn BiPAP and DuoNeb  3. Acute on chronic diastolic CHF  - Wt is up 4 kg since office visit on 11/19/16, CXR findings suggest acute CHF, physical exam difficult with pt body habitus  - TTE (08/02/16) with EF 65-70%, moderate LVH, severe LAE, mild AS, severe elevation in PA pressures - She is managed at home with Lasix 80 mg qD, Diamox, bisoprolol, losartan  - Plan to monitor on telemetry, SLIV, fluid-restrict diet, follow I/O's and daily wts, continue beta-blocker and ARB, hold Diamox, and diurese with Lasix 60 mg IV q12h adjusted prn    4. Atrial flutter with rapid rate  - Pt with chronic atrial flutter  - Rate in 100-130 range in ED, likely precipitated by pulmonary disease  - CHADS-VASc is 83 (age, gender, CHF, HTN, DM) - Continue warfarin  - Start diltiazem infusion for rate-control, continue to treat underlying pulmonary disease, and transition back to oral diltiazem as able   5. GERD - Remote EGD with duodenal erosions, no esophagitis  - Managed with Pepcid at home, will continue   6. Type II DM  - A1c was only 5.2% in September 2017  - Managed at home with metformin only, currently held - She will be on systemic steroids as above - Check CBG with meals and qHS  - Start a moderate-intensity correctional   7. Hypothyroidism  - Appears to be stable, will continue Synthroid     DVT prophylaxis: warfarin Code Status: Full  Family Communication: Husband updated at bedside Disposition Plan: Admit to SDU Consults called: None Admission status: Inpatient    Vianne Bulls, MD [facility] Pager [phone]  If 7PM-7AM, please contact night-coverage [email or web address] Password [facility]  12/09/2016, 9:40 PM

## 2016-12-09 NOTE — ED Notes (Signed)
RT called again about placing pt on bipap.

## 2016-12-09 NOTE — ED Notes (Signed)
Admitting at bedside 

## 2016-12-09 NOTE — ED Notes (Signed)
RT placing pt on bipap

## 2016-12-09 NOTE — ED Notes (Signed)
RT called about placing pt on bipap

## 2016-12-10 LAB — BASIC METABOLIC PANEL
Anion gap: 11 (ref 5–15)
BUN: 14 mg/dL (ref 6–20)
CO2: 31 mmol/L (ref 22–32)
CREATININE: 0.63 mg/dL (ref 0.44–1.00)
Calcium: 9.3 mg/dL (ref 8.9–10.3)
Chloride: 97 mmol/L — ABNORMAL LOW (ref 101–111)
GFR calc Af Amer: >60 mL/min (ref >60)
GFR calc non Af Amer: >60 mL/min (ref >60)
Glucose, Bld: 188 mg/dL — ABNORMAL HIGH (ref 65–99)
Potassium: 3.8 mmol/L (ref 3.5–5.1)
SODIUM: 139 mmol/L (ref 135–145)

## 2016-12-10 LAB — MRSA PCR SCREENING: MRSA BY PCR: NEGATIVE

## 2016-12-10 LAB — GLUCOSE, CAPILLARY
GLUCOSE-CAPILLARY: 191 mg/dL — AB (ref 65–99)
GLUCOSE-CAPILLARY: 214 mg/dL — AB (ref 65–99)
GLUCOSE-CAPILLARY: 279 mg/dL — AB (ref 65–99)
Glucose-Capillary: 191 mg/dL — ABNORMAL HIGH (ref 65–99)

## 2016-12-10 LAB — PROTIME-INR
INR: 1.68
Prothrombin Time: 20 seconds — ABNORMAL HIGH (ref 11.4–15.2)

## 2016-12-10 MED ORDER — WARFARIN SODIUM 4 MG PO TABS
8.0000 mg | ORAL_TABLET | Freq: Once | ORAL | Status: AC
  Administered 2016-12-10: 8 mg via ORAL
  Filled 2016-12-10: qty 2

## 2016-12-10 NOTE — Progress Notes (Signed)
PT Cancellation Note  Patient Details Name: Jillian Wood MRN: [id number] DOB: 05-08-1946   Cancelled Treatment:    Reason Eval/Treat Not Completed: RN deferred until pt is taken off of BiPAP. Will return as able.    Tracie Harrier, [place] [facility] SPT [phone]   Tracie Harrier 12/10/2016, 9:07 AM

## 2016-12-10 NOTE — Progress Notes (Signed)
RT Note: Patient has been desaturating this afternoon. RT had to increase her Fio2 to 60% and she is tolerating the increase well. She is now saturating 95% Patient has requested to come off of her Bipap since this morning but patient currently is requiring more O2 so RT does not feel comfortable taking her off presently. RN is aware and will notify this physician. Rt will continue to monitor.

## 2016-12-10 NOTE — Progress Notes (Signed)
PROGRESS NOTE                                                                                                                                                                                                             Patient Demographics:    Jillian Wood, is a 71 y.o. female, DOB - 07/05/1946, WTU:[id number]  Admit date - 12/09/2016   Admitting Physician Vianne Bulls, MD  Outpatient Primary MD for the patient is Alesia Richards, MD  LOS - 1  Outpatient Specialists:  Chief Complaint  Patient presents with  . Shortness of Breath       Brief Narrative   71 year old female with history of COPD with chronic hypoxic respiratory failure (on 5 L via nasal cannula), chronic diastolic CHF, hypertension, hypothyroidism, GERD was sent to the ED by her PCP for shortness of breath and increased oxygen requirement. About 2-3 days back he developed worsening of shortness of breath requiring almost up to 8 L via nasal cannula since her sats dropped to 80s. She also had increased productive cough with thick clear sputum. Denies fever or chills, chest pain, palpitations, leg swellings, nausea, vomiting, abdominal pain, bowel or urinary symptoms.  Patient placed on BiPAP on admission. Also went into rapid A. fib. Admitted to stepdown unit on BiPAP and Cardizem drip.   Subjective:   Patient on BiPAP this morning. Reports breathing to be slightly better.   Assessment  & Plan :    Principal Problem:   Acute on chronic respiratory failure with hypoxia (HCC) Combination of COPD exacerbation and acute on chronic diastolic CHF. Admitted to stepdown unit on BiPAP.    COPD with acute exacerbation (HCC) Weaned off BiPAP this morning. Wean off BiPAP this morning. Scheduled DuoNeb and when necessary albuterol nebs. IV Solu-Medrol. Supportive care with antitussives. Empiric IV azithromycin.  Acute on chronic diastolic CHF. Noted for  about 4 KG weight gain since past 2 weeks. 2-D echo from 07/2016 showing EF 60-70% with moderate LVH and elevated PA pressures. Placed on IV Lasix. Strict I/O and daily weight. Resume beta blocker and ARB. Holding Diamox.    Atrial flutter with rapid ventricular response ([place])  likely triggered by acute symptoms. Heart rate in up to 130s in the ED. Placed on Cardizem drip on admission now weaned off. Continue warfarin.  Active Problems: GERD Continue  Pepcid  Type 2 diabetes mellitus Last A1c of 5.2. On metformin at home which is held. Monitor on sliding scale coverage.    Essential hypertension Stable on home medications.    Hypothyroidism On Synthroid.    Code Status : Full code  Family Communication  : None at bedside  Disposition Plan  : Home once improved   Barriers For Discharge : Active symptoms  Consults  :  None  Procedures  : 2-D echo   DVT Prophylaxis  : Coumadin  Lab Results  Component Value Date   PLT 241 12/09/2016    Antibiotics  :    Anti-infectives    Start     Dose/Rate Route Frequency Ordered Stop   12/09/16 2200  azithromycin (ZITHROMAX) 500 mg in dextrose 5 % 250 mL IVPB     500 mg 250 mL/hr over 60 Minutes Intravenous Every 24 hours 12/09/16 2139          Objective:   Vitals:   12/10/16 0953 12/10/16 1132 12/10/16 1138 12/10/16 1221  BP: 121/72 131/72  121/90  Pulse: 97 90    Resp: (!) 23 (!) 30    Temp:      TempSrc:      SpO2:  91% 95% 95%  Weight:      Height:        Wt Readings from Last 3 Encounters:  12/10/16 116.2 kg (256 lb 1.6 oz)  12/09/16 115.7 kg (255 lb)  12/06/16 115.7 kg (255 lb)     Intake/Output Summary (Last 24 hours) at 12/10/16 1230 Last data filed at 12/10/16 1159  Gross per 24 hour  Intake            452.5 ml  Output             3600 ml  Net          -3147.5 ml     Physical Exam  Gen:On BiPAP  HEENT:On BiPAP, supple neck  Chest:Diminished bibasilar breath sounds with fine crackles  CVS:  S1  and S2 irregular, no murmurs rubs or gallop  GI: soft, NT, ND,  Musculoskeletal: warm,trace edema  HYW:VPXTG and oriented   Data Review:    CBC  Recent Labs Lab 12/09/16 1750  WBC 10.2  HGB 10.1*  HCT 36.2  PLT 241  MCV 82.6  MCH 23.1*  MCHC 27.9*  RDW 18.5*    Chemistries   Recent Labs Lab 12/09/16 1750 12/10/16 0240  NA 140 139  K 4.3 3.8  CL 99* 97*  CO2 30 31  GLUCOSE 129* 188*  BUN 10 14  CREATININE 0.65 0.63  CALCIUM 9.2 9.3   ------------------------------------------------------------------------------------------------------------------ No results for input(s): CHOL, HDL, LDLCALC, TRIG, CHOLHDL, LDLDIRECT in the last 72 hours.  Lab Results  Component Value Date   HGBA1C 5.2 06/06/2016   ------------------------------------------------------------------------------------------------------------------ No results for input(s): TSH, T4TOTAL, T3FREE, THYROIDAB in the last 72 hours.  Invalid input(s): FREET3 ------------------------------------------------------------------------------------------------------------------ No results for input(s): VITAMINB12, FOLATE, FERRITIN, TIBC, IRON, RETICCTPCT in the last 72 hours.  Coagulation profile  Recent Labs Lab 12/09/16 1750 12/10/16 0240  INR 1.68 1.68    No results for input(s): DDIMER in the last 72 hours.  Cardiac Enzymes No results for input(s): CKMB, TROPONINI, MYOGLOBIN in the last 168 hours.  Invalid input(s): CK ------------------------------------------------------------------------------------------------------------------    Component Value Date/Time   BNP 472.3 (H) 12/09/2016 1750   BNP 119.6 (H) 09/27/2014 1606    Inpatient Medications  Scheduled Meds: . aspirin  81 mg Oral Daily  . azithromycin  500 mg Intravenous Q24H  . bisoprolol  10 mg Oral Daily  . diltiazem  90 mg Oral Q8H  . DULoxetine  60 mg Oral Daily  . famotidine  20 mg Oral BID  . ferrous sulfate  325 mg Oral  BID WC  . furosemide  60 mg Intravenous BID  . guaiFENesin  600 mg Oral BID  . insulin aspart  0-15 Units Subcutaneous TID WC  . insulin aspart  0-5 Units Subcutaneous QHS  . ipratropium-albuterol  3 mL Nebulization Q6H  . levothyroxine  175 mcg Oral QAC breakfast  . losartan  50 mg Oral Daily  . methylPREDNISolone (SOLU-MEDROL) injection  60 mg Intravenous Q6H  . mometasone-formoterol  2 puff Inhalation BID  . pravastatin  40 mg Oral q1800  . sodium chloride flush  3 mL Intravenous Q12H  . warfarin  8 mg Oral ONCE-1800  . Warfarin - Pharmacist Dosing Inpatient   Does not apply q1800   Continuous Infusions: . diltiazem (CARDIZEM) infusion 7.5 mg/hr (12/10/16 0334)   PRN Meds:.sodium chloride, acetaminophen, acetaminophen-codeine, fluticasone, ipratropium-albuterol, ondansetron (ZOFRAN) IV, sodium chloride flush  Micro Results Recent Results (from the past 240 hour(s))  MRSA PCR Screening     Status: None   Collection Time: 12/09/16 11:37 PM  Result Value Ref Range Status   MRSA by PCR NEGATIVE NEGATIVE Final    Comment:        The GeneXpert MRSA Assay (FDA approved for NASAL specimens only), is one component of a comprehensive MRSA colonization surveillance program. It is not intended to diagnose MRSA infection nor to guide or monitor treatment for MRSA infections.     Radiology Reports Dg Chest Portable 1 View  Result Date: 12/09/2016 CLINICAL DATA:  71 year old female with shortness of breath and cough for 2-3 days. Initial encounter. EXAM: PORTABLE CHEST 1 VIEW COMPARISON:  11/11/2016 07/31/2016. FINDINGS: Pulmonary edema superimposed upon chronic changes. Right-sided pleural effusion suspected. Basilar atelectasis greater on the right. Cannot exclude basilar infiltrate or mass. Cardiomegaly. Calcified aorta. Shoulder joint degenerative change greater on left. IMPRESSION: Pulmonary edema superimposed upon chronic changes. Right-sided pleural effusion suspected. Basilar  atelectasis greater on the right. Cannot exclude basilar infiltrate or mass. Cardiomegaly. Electronically Signed   By: Genia Del M.D.   On: 12/09/2016 18:13   Dg Chest Portable 1 View  Result Date: 11/11/2016 CLINICAL DATA:  Abnormal EKG. EXAM: PORTABLE CHEST 1 VIEW COMPARISON:  07/31/2016 FINDINGS: 1857 hours. Low volume film. The cardio pericardial silhouette is enlarged. Pulmonary vascular congestion noted with probable interstitial pulmonary edema. There is bibasilar collapse/consolidation with small bilateral pleural effusions. The visualized bony structures of the thorax are intact. Telemetry leads overlie the chest. IMPRESSION: Cardiomegaly with interstitial edema, bibasilar collapse/ consolidation, and small effusions. Electronically Signed   By: Misty Stanley M.D.   On: 11/11/2016 19:09    Time Spent in minutes  35   Louellen Molder M.D on 12/10/2016 at 12:30 PM  Between 7am to 7pm - Pager - [phone]  After 7pm go to [email or web address] - password [facility]  [facility] -  Office  [phone]

## 2016-12-10 NOTE — Consult Note (Signed)
   [facility] CM Inpatient Consult   12/10/2016  ASAKO SALIBA 05/17/1946 [id number]    Patient screened for [place] Management program as she has a history of CHF, COPD, DM. Went to bedside to speak with her briefly about [place] [employer] program as a benefit of her Health Team [employer]. Ms. Rund states she is not sure if she would need the services. Discussed with Ms. Abraha that Probation officer could follow back up with her at later time since this is still early in her hospitalization and she is not feeling well. She is agreeable to this. Made inpatient RNCM aware.   Marthenia Rolling, MSN-Ed, RN,BSN [facility] Liaison [phone]

## 2016-12-10 NOTE — Progress Notes (Signed)
OT Cancellation Note  Patient Details Name: Jillian Wood MRN: [id number] DOB: Oct 13, 1945   Cancelled Treatment:    Reason Eval/Treat Not Completed: Patient not medically ready. Pt on BiPap. RN requesting to hold today. Will assess tomorrow if apropriate. Thanks  [place], OT/L  [id number] 12/10/2016 12/10/2016, 9:39 AM

## 2016-12-10 NOTE — Progress Notes (Signed)
[place] for warfarin  Indication: atrial fibrillation  Allergies  Allergen Reactions  . Inderal [Propranolol] Other (See Comments)    Hair loss  . Ace Inhibitors Cough    Patient Measurements: Height: 5\' 6"  (167.6 cm) Weight: 256 lb 1.6 oz (116.2 kg) IBW/kg (Calculated) : 59.3   Vital Signs: Temp: 97.2 F (36.2 C) (03/27 0840) Temp Source: Axillary (03/27 0840) BP: 121/72 (03/27 0807) Pulse Rate: 80 (03/27 0657)  Labs:  Recent Labs  12/09/16 1750 12/10/16 0240  HGB 10.1*  --   HCT 36.2  --   PLT 241  --   LABPROT 20.0* 20.0*  INR 1.68 1.68  CREATININE 0.65 0.63    Estimated Creatinine Clearance: 84.8 mL/min (by C-G formula based on SCr of 0.63 mg/dL).  Assessment: 71 yo female on warfarin PTA for atrial fibrillation. INR on admission subtherapeutic at 1.68.  INR unchanged. CBC not rechecked, no bleeding issues noted overnight.   PTA dose is 5mg  daily except 7.5mg  on Thursday and Tuesdays.   Goal of Therapy:  INR 2-3 Monitor platelets by anticoagulation protocol: Yes   Plan:  1. Coumadin 8 mg x tonight 2.   Daily INR   Erin Hearing PharmD., BCPS Clinical Pharmacist Pager [phone] 12/10/2016 10:30 AM

## 2016-12-10 NOTE — Progress Notes (Signed)
Nutrition Brief Note  Received consult per COPD Gold Protocol. Nutrition focused physical exam completed.  No muscle or subcutaneous fat depletion noticed. Weight has been stable.  Wt Readings from Last 15 Encounters:  12/10/16 256 lb 1.6 oz (116.2 kg)  12/09/16 255 lb (115.7 kg)  12/06/16 255 lb (115.7 kg)  11/19/16 246 lb (111.6 kg)  11/15/16 247 lb 6.4 oz (112.2 kg)  11/11/16 267 lb 9.6 oz (121.4 kg)  10/18/16 257 lb 6.4 oz (116.8 kg)  09/23/16 250 lb (113.4 kg)  08/28/16 261 lb (118.4 kg)  08/23/16 250 lb 1.6 oz (113.4 kg)  08/13/16 236 lb (107 kg)  08/11/16 237 lb (107.5 kg)  07/31/16 254 lb 3.2 oz (115.3 kg)  06/06/16 246 lb (111.6 kg)  04/17/16 254 lb 9.6 oz (115.5 kg)    Body mass index is 41.34 kg/m. Patient meets criteria for class 3, extreme/morbid obesity based on current BMI.   Current diet order is heart healthy, patient is consuming approximately 100% of meals at this time. She did not eat breakfast due to need for BiPAP. RN removed BiPAP and patient ate 100% of lunch. Hopefully, she can remain off BiPAP.  Labs and medications reviewed.   No nutrition interventions warranted at this time. If nutrition issues arise, please consult RD.   Molli Barrows, RD, LDN, [place] Pager [phone] After Hours Pager [phone]

## 2016-12-10 NOTE — Progress Notes (Signed)
Pt. Transported to 3W-08 uneventfully on BiPAP.

## 2016-12-11 ENCOUNTER — Inpatient Hospital Stay (HOSPITAL_COMMUNITY): Payer: PPO

## 2016-12-11 DIAGNOSIS — R079 Chest pain, unspecified: Secondary | ICD-10-CM

## 2016-12-11 LAB — GLUCOSE, CAPILLARY
Glucose-Capillary: 253 mg/dL — ABNORMAL HIGH (ref 65–99)
Glucose-Capillary: 278 mg/dL — ABNORMAL HIGH (ref 65–99)
Glucose-Capillary: 166 mg/dL — ABNORMAL HIGH (ref 65–99)

## 2016-12-11 LAB — ECHOCARDIOGRAM COMPLETE
HEIGHTINCHES: 66 in
Weight: 4049.6 oz

## 2016-12-11 LAB — BASIC METABOLIC PANEL
Anion gap: 13 (ref 5–15)
BUN: 22 mg/dL — ABNORMAL HIGH (ref 6–20)
CALCIUM: 9.4 mg/dL (ref 8.9–10.3)
CO2: 33 mmol/L — ABNORMAL HIGH (ref 22–32)
CREATININE: 0.82 mg/dL (ref 0.44–1.00)
Chloride: 93 mmol/L — ABNORMAL LOW (ref 101–111)
GFR calc non Af Amer: >60 mL/min (ref >60)
Glucose, Bld: 212 mg/dL — ABNORMAL HIGH (ref 65–99)
Potassium: 3.9 mmol/L (ref 3.5–5.1)
SODIUM: 139 mmol/L (ref 135–145)

## 2016-12-11 LAB — PROTIME-INR
INR: 2.45
PROTHROMBIN TIME: 27 s — AB (ref 11.4–15.2)

## 2016-12-11 MED ORDER — IPRATROPIUM-ALBUTEROL 0.5-2.5 (3) MG/3ML IN SOLN
3.0000 mL | Freq: Three times a day (TID) | RESPIRATORY_TRACT | Status: DC
  Administered 2016-12-11 – 2016-12-15 (×14): 3 mL via RESPIRATORY_TRACT
  Filled 2016-12-11 (×15): qty 3

## 2016-12-11 MED ORDER — FUROSEMIDE 40 MG PO TABS
60.0000 mg | ORAL_TABLET | Freq: Two times a day (BID) | ORAL | Status: DC
  Administered 2016-12-11: 16:00:00 60 mg via ORAL
  Filled 2016-12-11 (×2): qty 1

## 2016-12-11 MED ORDER — BISACODYL 5 MG PO TBEC
10.0000 mg | DELAYED_RELEASE_TABLET | Freq: Once | ORAL | Status: AC
  Administered 2016-12-11: 10 mg via ORAL
  Filled 2016-12-11: qty 2

## 2016-12-11 MED ORDER — PERFLUTREN LIPID MICROSPHERE
INTRAVENOUS | Status: AC
  Filled 2016-12-11: qty 10

## 2016-12-11 MED ORDER — PERFLUTREN LIPID MICROSPHERE
1.0000 mL | INTRAVENOUS | Status: AC | PRN
  Administered 2016-12-11: 3 mL via INTRAVENOUS
  Filled 2016-12-11: qty 10

## 2016-12-11 NOTE — Progress Notes (Signed)
PROGRESS NOTE    Jillian Wood  OZH:[id number] DOB: 08-08-1946 DOA: 12/09/2016 PCP: Alesia Richards, MD   Brief Narrative: 71 year old female with history of COPD with chronic hypoxic respiratory failure (on 5 L via nasal cannula), chronic diastolic CHF, hypertension, hypothyroidism, GERD was sent to the ED by her PCP for shortness of breath and increased oxygen requirement Patient placed on BiPAP on admission. Also went into rapid A. fib. Admitted to stepdown unit on BiPAP and Cardizem drip. Assessment & Plan:  # Acute on chronic respiratory failure with hypoxia: Due to combination of COPD exacerbation and acute on chronic diastolic congestive heart failure. Requiring about 10 L of oxygen today. Try to wean down slowly. Patient uses about 5 L of oxygen at home. Patient is clinically improving.  #COPD with acute exacerbation: On BiPAP. Continue bronchodilators, Solu-Medrol, azithromycin, Mucinex. Likely able to transition to oral prednisone and discharged with slow tapering.  #Acute on chronic diastolic congestive heart failure: Patient is currently on IV Lasix. She is negative by 2.6 L since admission. Serum creatinine level 0.8 today. I will switch to oral diuretics today. -Echocardiogram on 07/2016 showed EF of 60-70, no wall motion abnormalities. Patient has no chest pain and reported shortness of breath is better.  #Atrial flutter with rapid ventricular response: Off Cardizem drip and now on oral Cardizem. Continue bisoprolol, Coumadin. Monitor INR. Monitor heart rate.  #Type 2 diabetes: On metformin at home. Last A1c 5.2. Continue sliding scale. Monitor blood sugar level.  #Essential hypertension: Blood pressure acceptable. Continue to monitor.   #Hypothyroidism: Continue Synthroid.  Principal Problem:   COPD with acute exacerbation ([place]) Active Problems:   Essential hypertension   Hypothyroidism   Acute on chronic diastolic CHF (congestive heart failure) (HCC)   GERD  (gastroesophageal reflux disease)   Acute on chronic respiratory failure with hypoxia (H[facility])   Atrial flutter with rapid ventricular response ([place])   Diabetes mellitus type 2 in obese (HCC)  DVT prophylaxis: on Coumadin  Code Status: full code  Family Communication: no family present at bedside. Disposition Plan:likely discharge home with home care. PT OT evaluation     Consultants:   None   Procedures: BiPAP  Antimicrobials:Azithromycin   Subjective: Patient was seen and examined at bedside. Shortness of breath is improving. Denied chest pain, nausea, vomiting, abdominal pain.   Objective: Vitals:   12/11/16 0729 12/11/16 0913 12/11/16 0919 12/11/16 1105  BP: 104/70   128/83  Pulse: 76   100  Resp: 20   20  Temp: 97.1 F (36.2 C)   98.3 F (36.8 C)  TempSrc: Axillary   Oral  SpO2: 93% 94% 100% 95%  Weight:      Height:        Intake/Output Summary (Last 24 hours) at 12/11/16 1238 Last data filed at 12/11/16 1118  Gross per 24 hour  Intake             1213 ml  Output              850 ml  Net              363 ml   Filed Weights   12/09/16 2333 12/10/16 0318 12/11/16 0610  Weight: 117.4 kg (258 lb 14.4 oz) 116.2 kg (256 lb 1.6 oz) 114.8 kg (253 lb 1.6 oz)    Examination:  General exam: Appears calm and comfortable  Respiratory systemBibasal decreased breath sound . Respiratory effort normal. No wheezing or crackle Cardiovascular system: S1 & S2  heard, RRR.  Gastrointestinal system: Abdomen is nondistended, soft and nontender. Normal bowel sounds heard. Central nervous system: Alert and oriented. No focal neurological deficits. Extremities: Symmetric 5 x 5 power. lower extremity edema 1+  Skin: No rashes, lesions or ulcers Psychiatry: Judgement and insight appear normal. Mood & affect appropriate.     Data Reviewed: I have personally reviewed following labs and imaging studies  CBC:  Recent Labs Lab 12/09/16 1750  WBC 10.2  HGB 10.1*  HCT 36.2  MCV  82.6  PLT 242   Basic Metabolic Panel:  Recent Labs Lab 12/09/16 1750 12/10/16 0240 12/11/16 0326  NA 140 139 139  K 4.3 3.8 3.9  CL 99* 97* 93*  CO2 30 31 33*  GLUCOSE 129* 188* 212*  BUN 10 14 22*  CREATININE 0.65 0.63 0.82  CALCIUM 9.2 9.3 9.4   GFR: Estimated Creatinine Clearance: 82.1 mL/min (by C-G formula based on SCr of 0.82 mg/dL). Liver Function Tests: No results for input(s): AST, ALT, ALKPHOS, BILITOT, PROT, ALBUMIN in the last 168 hours. No results for input(s): LIPASE, AMYLASE in the last 168 hours. No results for input(s): AMMONIA in the last 168 hours. Coagulation Profile:  Recent Labs Lab 12/09/16 1750 12/10/16 0240 12/11/16 0326  INR 1.68 1.68 2.45   Cardiac Enzymes: No results for input(s): CKTOTAL, CKMB, CKMBINDEX, TROPONINI in the last 168 hours. BNP (last 3 results) No results for input(s): PROBNP in the last 8760 hours. HbA1C: No results for input(s): HGBA1C in the last 72 hours. CBG:  Recent Labs Lab 12/10/16 1114 12/10/16 1636 12/10/16 2032 12/11/16 0729 12/11/16 1104  GLUCAP 191* 214* 279* 253* 278*   Lipid Profile: No results for input(s): CHOL, HDL, LDLCALC, TRIG, CHOLHDL, LDLDIRECT in the last 72 hours. Thyroid Function Tests: No results for input(s): TSH, T4TOTAL, FREET4, T3FREE, THYROIDAB in the last 72 hours. Anemia Panel: No results for input(s): VITAMINB12, FOLATE, FERRITIN, TIBC, IRON, RETICCTPCT in the last 72 hours. Sepsis Labs: No results for input(s): PROCALCITON, LATICACIDVEN in the last 168 hours.  Recent Results (from the past 240 hour(s))  MRSA PCR Screening     Status: None   Collection Time: 12/09/16 11:37 PM  Result Value Ref Range Status   MRSA by PCR NEGATIVE NEGATIVE Final    Comment:        The GeneXpert MRSA Assay (FDA approved for NASAL specimens only), is one component of a comprehensive MRSA colonization surveillance program. It is not intended to diagnose MRSA infection nor to guide  or monitor treatment for MRSA infections.          Radiology Studies: Dg Chest Portable 1 View  Result Date: 12/09/2016 CLINICAL DATA:  71 year old female with shortness of breath and cough for 2-3 days. Initial encounter. EXAM: PORTABLE CHEST 1 VIEW COMPARISON:  11/11/2016 07/31/2016. FINDINGS: Pulmonary edema superimposed upon chronic changes. Right-sided pleural effusion suspected. Basilar atelectasis greater on the right. Cannot exclude basilar infiltrate or mass. Cardiomegaly. Calcified aorta. Shoulder joint degenerative change greater on left. IMPRESSION: Pulmonary edema superimposed upon chronic changes. Right-sided pleural effusion suspected. Basilar atelectasis greater on the right. Cannot exclude basilar infiltrate or mass. Cardiomegaly. Electronically Signed   By: Genia Del M.D.   On: 12/09/2016 18:13        Scheduled Meds: . aspirin  81 mg Oral Daily  . azithromycin  500 mg Intravenous Q24H  . bisoprolol  10 mg Oral Daily  . diltiazem  90 mg Oral Q8H  . DULoxetine  60 mg Oral  Daily  . famotidine  20 mg Oral BID  . ferrous sulfate  325 mg Oral BID WC  . furosemide  60 mg Intravenous BID  . guaiFENesin  600 mg Oral BID  . insulin aspart  0-15 Units Subcutaneous TID WC  . insulin aspart  0-5 Units Subcutaneous QHS  . ipratropium-albuterol  3 mL Nebulization Q6H  . levothyroxine  175 mcg Oral QAC breakfast  . losartan  50 mg Oral Daily  . methylPREDNISolone (SOLU-MEDROL) injection  60 mg Intravenous Q6H  . mometasone-formoterol  2 puff Inhalation BID  . pravastatin  40 mg Oral q1800  . sodium chloride flush  3 mL Intravenous Q12H  . Warfarin - Pharmacist Dosing Inpatient   Does not apply q1800   Continuous Infusions: . diltiazem (CARDIZEM) infusion 7.5 mg/hr (12/10/16 0334)     LOS: 2 days    Tamari Redwine Tanna Furry, MD [facility] Pager [phone]  If 7PM-7AM, please contact night-coverage [email or web address] Password [facility] 12/11/2016, 12:38 PM

## 2016-12-11 NOTE — Progress Notes (Signed)
[place] for warfarin  Indication: atrial fibrillation  Allergies  Allergen Reactions  . Inderal [Propranolol] Other (See Comments)    Hair loss  . Ace Inhibitors Cough    Patient Measurements: Height: 5\' 6"  (167.6 cm) Weight: 253 lb 1.6 oz (114.8 kg) IBW/kg (Calculated) : 59.3   Vital Signs: Temp: 98.3 F (36.8 C) (03/28 1105) Temp Source: Oral (03/28 1105) BP: 128/83 (03/28 1105) Pulse Rate: 100 (03/28 1105)  Labs:  Recent Labs  12/09/16 1750 12/10/16 0240 12/11/16 0326  HGB 10.1*  --   --   HCT 36.2  --   --   PLT 241  --   --   LABPROT 20.0* 20.0* 27.0*  INR 1.68 1.68 2.45  CREATININE 0.65 0.63 0.82    Estimated Creatinine Clearance: 82.1 mL/min (by C-G formula based on SCr of 0.82 mg/dL).  Assessment: 71 yo female on warfarin PTA for atrial fibrillation. INR on admission subtherapeutic at 1.68.  INR unchanged yesterday but now has bumped from 1.6>2.4 after slightly higher warfarin dose last night . CBC not rechecked, no bleeding issues noted overnight.   PTA dose is 5mg  daily except 7.5mg  on Thursday and Tuesdays.   Goal of Therapy:  INR 2-3 Monitor platelets by anticoagulation protocol: Yes   Plan:  1. Hold warfarin today 3/28 2.   Daily INR  Erin Hearing PharmD., BCPS Clinical Pharmacist Pager [phone] 12/11/2016 12:06 PM

## 2016-12-11 NOTE — Progress Notes (Signed)
  Echocardiogram 2D Echocardiogram has been performed with definity.  Aggie Cosier 12/11/2016, 4:16 PM

## 2016-12-11 NOTE — Discharge Instructions (Signed)

## 2016-12-11 NOTE — Progress Notes (Signed)
Bedside handoff communication to Jalesa Milam, RN. 

## 2016-12-11 NOTE — Evaluation (Signed)
Occupational Therapy Evaluation Patient Details Name: Jillian Wood MRN: [id number] DOB: Jan 06, 1946 Today's Date: 12/11/2016    History of Present Illness 71 y.o. female with medical history significant for COPD, chronic hypoxic respiratory failure, chronic diastolic CHF, hypertension, hypothyroidism, and GERD who presents with c/o dyspnea and hypoxia.   Clinical Impression   Pt reports she required assist from family with ADL PTA. Currently pt able to perform stand pivot transfer with min hand held assist and requires mod-max assist for ADL. Pt presenting with generalized weakness, pain, decreased activity tolerance, and deconditioning impacting her independence and safety with ADL and functional mobility. Pt planning to d/c home with 24/7 supervision from family. Recommending HHOT for follow up to maximize independence and safety with ADL and functional mobility upon return home. Pt would benefit from continued skilled OT to address established goals.    Follow Up Recommendations  Home health OT;Supervision/Assistance - 24 hour    Equipment Recommendations  None recommended by OT    Recommendations for Other Services PT consult     Precautions / Restrictions Precautions Precautions: Fall Precaution Comments: watch O2 Restrictions Weight Bearing Restrictions: No      Mobility Bed Mobility               General bed mobility comments: Pt sitting EOB upon arrival  Transfers Overall transfer level: Needs assistance Equipment used: None Transfers: Sit to/from Stand;Stand Pivot Transfers Sit to Stand: Min guard Stand pivot transfers: Min assist       General transfer comment: Min hand held assist for steadying balance in standing and for pivot to chair    Balance Overall balance assessment: Needs assistance Sitting-balance support: Feet supported;No upper extremity supported Sitting balance-Leahy Scale: Fair     Standing balance support: Single extremity  supported Standing balance-Leahy Scale: Poor                             ADL either performed or assessed with clinical judgement   ADL Overall ADL's : Needs assistance/impaired Eating/Feeding: Set up;Sitting Eating/Feeding Details (indicate cue type and reason): pt eating breakfast upon arrival Grooming: Minimal assistance;Sitting   Upper Body Bathing: Moderate assistance;Sitting   Lower Body Bathing: Maximal assistance;Sit to/from stand   Upper Body Dressing : Minimal assistance;Sitting   Lower Body Dressing: Maximal assistance;Sit to/from stand   Toilet Transfer: Minimal assistance;Stand-pivot;BSC (hand held assist) Toilet Transfer Details (indicate cue type and reason): simulated by EOB > chair         Functional mobility during ADLs: Minimal assistance (for stand pivot) General ADL Comments: Pt with drop in SpO2 with activity but quickly bounces back >90% on 13L supplemental O2 with seated rest     Vision         Perception     Praxis      Pertinent Vitals/Pain Pain Assessment: Faces Faces Pain Scale: Hurts even more Pain Location: back, head Pain Descriptors / Indicators: Grimacing;Sore;Headache;Aching Pain Intervention(s): Monitored during session;Limited activity within patient's tolerance;Repositioned;Patient requesting pain meds-RN notified     Hand Dominance     Extremity/Trunk Assessment Upper Extremity Assessment Upper Extremity Assessment: Generalized weakness   Lower Extremity Assessment Lower Extremity Assessment: Defer to PT evaluation   Cervical / Trunk Assessment Cervical / Trunk Assessment: Other exceptions Cervical / Trunk Exceptions: body habitus   Communication Communication Communication: No difficulties   Cognition Arousal/Alertness: Awake/alert Behavior During Therapy: WFL for tasks assessed/performed (tearful at end of session) Overall Cognitive  Status: Within Functional Limits for tasks assessed                                      General Comments       Exercises     Shoulder Instructions      Home Living Family/patient expects to be discharged to:: Private residence Living Arrangements: Spouse/significant other;Children Available Help at Discharge: Family;Available 24 hours/day Type of Home: House Home Access: Ramped entrance     Home Layout: One level     Bathroom Shower/Tub: Occupational psychologist: Standard     Home Equipment: Cane - single point;Shower seat;Walker - 4 wheels;Bedside commode;Other (comment) (home O2)          Prior Functioning/Environment Level of Independence: Needs assistance  Gait / Transfers Assistance Needed: ambulates short household distances with RW ADL's / Homemaking Assistance Needed: husband/son assists with ADL   Comments: 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, maintenance (IT)        OT Problem List: Decreased strength;Decreased activity tolerance;Impaired balance (sitting and/or standing);Decreased knowledge of use of DME or AE;Cardiopulmonary status limiting activity;Obesity;Pain      OT Treatment/Interventions: Self-care/ADL training;Therapeutic exercise;Energy conservation;DME and/or AE instruction;Therapeutic activities;Patient/family education;Balance training    OT Goals(Current goals can be found in the care plan section) Acute Rehab OT Goals Patient Stated Goal: return home OT Goal Formulation: With patient Time For Goal Achievement: 12/25/16 Potential to Achieve Goals: Good ADL Goals Pt Will Perform Grooming: with supervision;sitting Pt Will Transfer to Toilet: with supervision;ambulating;bedside commode (over toilet) Pt Will Perform Toileting - Clothing Manipulation and hygiene: with supervision;sit to/from stand Pt Will Perform Tub/Shower Transfer: Shower transfer;with supervision;ambulating;shower seat;rolling walker Additional ADL Goal #1: Pt will independently verbally recall 3 energy conservation  strategies and utilize during ADL.  OT Frequency: Min 2X/week   Barriers to D/C:            Co-evaluation              End of Session Equipment Utilized During Treatment: Oxygen Nurse Communication: Mobility status;Patient requests pain meds  Activity Tolerance: Patient tolerated treatment well Patient left: in chair;with call bell/phone within reach  OT Visit Diagnosis: Unsteadiness on feet (R26.81);Muscle weakness (generalized) (M62.81)                Time: 5456-2563 OT Time Calculation (min): 29 min Charges:  OT General Charges $OT Visit: 1 Procedure OT Evaluation $OT Eval Moderate Complexity: 1 Procedure OT Treatments $Self Care/Home Management : 8-22 mins G-Codes:     Mel Almond A. Ulice Brilliant, M.S., OTR/L Pager: [place] 12/11/2016, 9:31 AM

## 2016-12-11 NOTE — Progress Notes (Signed)
Inpatient Diabetes Program Recommendations  AACE/ADA: New Consensus Statement on Inpatient Glycemic Control (2015)  Target Ranges:  Prepandial:   less than 140 mg/dL      Peak postprandial:   less than 180 mg/dL (1-2 hours)      Critically ill patients:  140 - 180 mg/dL   Results for AIVAH, PUTMAN (MRN [id number]) as of 12/11/2016 11:47  Ref. Range 12/10/2016 11:14 12/10/2016 16:36 12/10/2016 20:32 12/11/2016 07:29 12/11/2016 11:04  Glucose-Capillary Latest Ref Range: 65 - 99 mg/dL 191 (H) 214 (H) 279 (H) 253 (H) 278 (H)   Review of Glycemic Control  Diabetes history: DM 2 Outpatient Diabetes medications: Metformin 500 mg 4x/day Current orders for Inpatient glycemic control: Novolog Moderate + HS scale  Inpatient Diabetes Program Recommendations:   Glucose elevated still while patient is receiving IV Solumedrol 60 mg Q6 hours. Please consider Novolog 3 units TID meal coverage.   Thanks,  Tama Headings RN, MSN, [facility] Inpatient Diabetes Coordinator Team Pager [phone] (8a-5p)

## 2016-12-11 NOTE — Evaluation (Signed)
Physical Therapy Evaluation Patient Details Name: ALEICIA KENAGY MRN: [id number] DOB: [birth year] Today's Date: 12/11/2016   History of Present Illness  Pt is a 71 y.o. female with medical history significant for COPD, chronic hypoxic respiratory failure, chronic diastolic CHF, hypertension, hypothyroidism, and GERD who presents with c/o dyspnea and hypoxia.  Clinical Impression  Pt presented supine in bed with HOB elevated, awake and willing to participate in therapy session. Prior to admission, pt reported that she ambulated with use of rollator and required assistance from husband or son with ADLs. Pt performed transfers with use of RW and min guard for safety. Pt has all necessary DME at home. Pt's SPO2 maintained >90% throughout on 10L of O2 via HFNC but pt with increased DOE with transfers. Pt would continue to benefit from skilled physical therapy services at this time while admitted and after d/c to address the below listed limitations in order to improve overall safety and independence with functional mobility.      Follow Up Recommendations Home health PT    Equipment Recommendations  None recommended by PT    Recommendations for Other Services       Precautions / Restrictions Precautions Precautions: Fall Precaution Comments: watch O2 Restrictions Weight Bearing Restrictions: No      Mobility  Bed Mobility Overal bed mobility: Needs Assistance Bed Mobility: Supine to Sit     Supine to sit: Supervision;HOB elevated     General bed mobility comments: supervision for safety  Transfers Overall transfer level: Needs assistance Equipment used: Rolling walker (2 wheeled) Transfers: Sit to/from [facility] Sit to Stand: Min guard Stand pivot transfers: Min guard       General transfer comment: increased time, good technique, min guard for safety as pt with mild instability with pivotal movements  Ambulation/Gait             General Gait  Details: deferred at this time secondary to increased DOE with transfers  Stairs            Wheelchair Mobility    Modified Rankin (Stroke Patients Only)       Balance Overall balance assessment: Needs assistance Sitting-balance support: Feet supported;No upper extremity supported Sitting balance-Leahy Scale: Fair     Standing balance support: During functional activity;Single extremity supported Standing balance-Leahy Scale: Poor Standing balance comment: pt reliant on at least one UE support                             Pertinent Vitals/Pain Pain Assessment: No/denies pain    Home Living Family/patient expects to be discharged to:: Private residence Living Arrangements: Spouse/significant other;Children Available Help at Discharge: Family;Available 24 hours/day Type of Home: House Home Access: Ramped entrance     Home Layout: One level Home Equipment: Cane - single point;Shower seat;Walker - 4 wheels;Bedside commode;Other (comment) (home O2)      Prior Function Level of Independence: Needs assistance   Gait / Transfers Assistance Needed: ambulates short household distances with RW  ADL's / Homemaking Assistance Needed: husband/son assists with ADL  Comments: 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        Extremity/Trunk Assessment   Upper Extremity Assessment Upper Extremity Assessment: Defer to OT evaluation    Lower Extremity Assessment Lower Extremity Assessment: Generalized weakness    Cervical / Trunk Assessment Cervical / Trunk Assessment: Other exceptions Cervical / Trunk  Exceptions: chronic low back pain  Communication   Communication: No difficulties  Cognition Arousal/Alertness: Awake/alert Behavior During Therapy: WFL for tasks assessed/performed Overall Cognitive Status: Within Functional Limits for tasks assessed                                        General  Comments      Exercises     Assessment/Plan    PT Assessment Patient needs continued PT services  PT Problem List Decreased strength;Decreased activity tolerance;Decreased balance;Decreased mobility;Decreased coordination;Decreased knowledge of use of DME;Decreased safety awareness;Cardiopulmonary status limiting activity       PT Treatment Interventions DME instruction;Gait training;Functional mobility training;Therapeutic activities;Therapeutic exercise;Balance training;Neuromuscular re-education;Patient/family education;Stair training    PT Goals (Current goals can be found in the Care Plan section)  Acute Rehab PT Goals Patient Stated Goal: return home PT Goal Formulation: With patient Time For Goal Achievement: 12/25/16 Potential to Achieve Goals: Good    Frequency Min 3X/week   Barriers to discharge        Co-evaluation               End of Session Equipment Utilized During Treatment: Gait belt;Oxygen (10L of O2 via HFNC) Activity Tolerance: Patient limited by fatigue Patient left: in bed;with call bell/phone within reach;with nursing/sitter in room (sitting EOB) Nurse Communication: Mobility status PT Visit Diagnosis: Other abnormalities of gait and mobility (R26.89);Muscle weakness (generalized) (M62.81)    Time: 1191-4782 PT Time Calculation (min) (ACUTE ONLY): 19 min   Charges:   PT Evaluation $PT Eval Moderate Complexity: 1 Procedure     PT G Codes:        Sherie Don, PT, DPT [place] 12/11/2016, 4:03 PM

## 2016-12-12 DIAGNOSIS — I5033 Acute on chronic diastolic (congestive) heart failure: Secondary | ICD-10-CM

## 2016-12-12 DIAGNOSIS — I10 Essential (primary) hypertension: Secondary | ICD-10-CM

## 2016-12-12 DIAGNOSIS — I519 Heart disease, unspecified: Secondary | ICD-10-CM

## 2016-12-12 DIAGNOSIS — I4892 Unspecified atrial flutter: Secondary | ICD-10-CM

## 2016-12-12 LAB — GLUCOSE, CAPILLARY
GLUCOSE-CAPILLARY: 235 mg/dL — AB (ref 65–99)
GLUCOSE-CAPILLARY: 232 mg/dL — AB (ref 65–99)
GLUCOSE-CAPILLARY: 290 mg/dL — AB (ref 65–99)
Glucose-Capillary: 213 mg/dL — ABNORMAL HIGH (ref 65–99)
Glucose-Capillary: 293 mg/dL — ABNORMAL HIGH (ref 65–99)

## 2016-12-12 LAB — BASIC METABOLIC PANEL
ANION GAP: 10 (ref 5–15)
BUN: 38 mg/dL — ABNORMAL HIGH (ref 6–20)
CHLORIDE: 92 mmol/L — AB (ref 101–111)
CO2: 35 mmol/L — AB (ref 22–32)
Calcium: 9.6 mg/dL (ref 8.9–10.3)
Creatinine, Ser: 0.98 mg/dL (ref 0.44–1.00)
GFR calc Af Amer: >60 mL/min (ref >60)
GFR calc non Af Amer: 57 mL/min — ABNORMAL LOW (ref >60)
GLUCOSE: 232 mg/dL — AB (ref 65–99)
POTASSIUM: 4.4 mmol/L (ref 3.5–5.1)
Sodium: 137 mmol/L (ref 135–145)

## 2016-12-12 LAB — PROTIME-INR
INR: 2.55
Prothrombin Time: 27.9 seconds — ABNORMAL HIGH (ref 11.4–15.2)

## 2016-12-12 MED ORDER — ACETAZOLAMIDE 250 MG PO TABS
250.0000 mg | ORAL_TABLET | Freq: Two times a day (BID) | ORAL | Status: DC
  Administered 2016-12-12 – 2016-12-15 (×7): 250 mg via ORAL
  Filled 2016-12-12 (×11): qty 1

## 2016-12-12 MED ORDER — DILTIAZEM HCL ER COATED BEADS 180 MG PO CP24
300.0000 mg | ORAL_CAPSULE | Freq: Every day | ORAL | Status: DC
  Administered 2016-12-12 – 2016-12-16 (×5): 300 mg via ORAL
  Filled 2016-12-12 (×5): qty 1

## 2016-12-12 MED ORDER — PREDNISONE 20 MG PO TABS
50.0000 mg | ORAL_TABLET | Freq: Every day | ORAL | Status: DC
  Administered 2016-12-13 – 2016-12-15 (×3): 50 mg via ORAL
  Filled 2016-12-12 (×4): qty 2

## 2016-12-12 MED ORDER — FUROSEMIDE 40 MG PO TABS
40.0000 mg | ORAL_TABLET | Freq: Two times a day (BID) | ORAL | Status: DC
  Administered 2016-12-12 – 2016-12-15 (×8): 40 mg via ORAL
  Filled 2016-12-12 (×9): qty 1

## 2016-12-12 NOTE — Progress Notes (Signed)
Pt sleeping and sats were 81.  Woke pt up to give treatment.  Sat Up and Sats improving.  Talked to nurse and pt about wearing BIPAP while asleep

## 2016-12-12 NOTE — Consult Note (Signed)
Cardiology Consult    Patient ID: Jillian Wood MRN: [id number], DOB/AGE: March 03, 1946   Admit date: 12/09/2016 Date of Consult: 12/12/2016  Primary Physician: Alesia Richards, MD Reason for Consult: Mobile mass noted on echo Primary Cardiologist: Dr. Gwenlyn Found Electrophysiologist: Dr. Lovena Le (2015) Requesting Provider: Dr. Carolin Sicks  History of Present Illness    Jillian Wood is a 71 y.o. with past medical history significant for 50 pack year smoker-quit 3 years, COPD, chronic shortness of breath on home 5L O2, diastolic heart failure, atrial fibrillation on chronic coumadin, SVT seen by Dr. Rayann Heman, HTN, HLD, Type II DM, morbid obesity with BMI of 41 who presented tot he [facility] ED on 12/09/16 for worsening shortness of breath and increased oxygen demand. She was admitted to stepdown unit and placed on BIPAP and cardizem drip. Cardiology is consulted for mobile mass noted on echo.   She is wheel chair bound due to DJD and disc disease. She has never had a heart attack or stroke. No family history of heart disease. She has had multiple admissions for COPD. She has a history of symptomatic SVT for which she required IV adenosine on 2 episodes. She saw Dr. Rayann Heman in 2015 at which time she was being successfully treated with diltiazem. An ablation was considered, but since she was well controlled that was deferred.   She developed worsening shortness of breath and inability to get in a a full breath over the weekend. She was seen at her PCP and sent to the hospital. She is breathing better now, able to take a deep breath and is back near her baseline. She has had no chest pain, palpitations, lightheadedness. Last night she was able to lay flat to sleep. She uses CPAP with oxygen at home for sleep.  An echo from yesterday shows LV EF 65-70%, calcified aortic and mitral annulus with a calcified mitral leaflets and a mobile mass noted on the ventricular side of the valve - this could be  vegetation or thrombus. TEE is recommended for further assessment. There is moderate TR, RVSP 61 mmHg, dilated IVC, no pericardial effusion.  Labs:  BNP 472.3,   k+ 4.4, SCr 0.98 CXR: Pulmonary edema superimposed upon chronic changes. Right-sided pleural effusion suspected.         Basilar atelectasis greater on the right. Cannot exclude basilar infiltrate or mass. Cardiomegaly. EKG Atrial flutter with variable A-V block with premature ventricular or aberrantly conducted complexes,   106 bpm,  Right axis deviation, Incomplete right bundle branch block, Possible Right ventricular hypertrophy Nonspecific ST and T wave abnormality  Past Medical History   Past Medical History:  Diagnosis Date  . Arthritis   . CHF (congestive heart failure) ([place])   . COPD (chronic obstructive pulmonary disease) ([place])   . Depression   . Diabetes mellitus type 2 in obese ([place])   . GERD (gastroesophageal reflux disease)   . Hyperlipidemia   . Hypertension   . Morbid obesity ([place])   . OAB (overactive bladder)   . PSVT (paroxysmal supraventricular tachycardia) ([place])   . Thyroid disease   . Vitamin D deficiency     Past Surgical History:  Procedure Laterality Date  . CARPAL TUNNEL RELEASE     L 1992 R 1984  . EYE SURGERY Bilateral    cataract  . NM MYOVIEW LTD  01/2011   Dobutamine Myoview: Negative perfusion scan for ischemia / infarct (poor image capture); Pt developed SVT with Dobutamine that reproduced her CP as it  resolved with restoration of NSR.  Marland Kitchen PUBOVAGINAL SLING    . TONSILLECTOMY AND ADENOIDECTOMY    . TRANSTHORACIC ECHOCARDIOGRAM  01/2011   Hyperdynamic LV with EF 65-70%, Gr 1 DD, Mild Ao Stenosis - mean gradient ~19 mmHg     Allergies  Allergies  Allergen Reactions  . Inderal [Propranolol] Other (See Comments)    Hair loss  . Ace Inhibitors Cough    Inpatient Medications    . acetaZOLAMIDE  250 mg Oral BID  . aspirin  81 mg Oral Daily  . azithromycin  500 mg Intravenous Q24H  .  bisoprolol  10 mg Oral Daily  . diltiazem  300 mg Oral Daily  . DULoxetine  60 mg Oral Daily  . famotidine  20 mg Oral BID  . ferrous sulfate  325 mg Oral BID WC  . furosemide  40 mg Oral BID  . guaiFENesin  600 mg Oral BID  . insulin aspart  0-15 Units Subcutaneous TID WC  . insulin aspart  0-5 Units Subcutaneous QHS  . ipratropium-albuterol  3 mL Nebulization TID  . levothyroxine  175 mcg Oral QAC breakfast  . losartan  50 mg Oral Daily  . mometasone-formoterol  2 puff Inhalation BID  . pravastatin  40 mg Oral q1800  . [START ON 12/13/2016] predniSONE  50 mg Oral Q breakfast  . sodium chloride flush  3 mL Intravenous Q12H  . Warfarin - Pharmacist Dosing Inpatient   Does not apply q1800    Family History    Family History  Problem Relation Age of Onset  . Alcohol abuse Father   . Heart disease Father     Social History    Social History   Social History  . Marital status: Married    Spouse name: N/A  . Number of children: N/A  . Years of education: N/A   Occupational History  . Not on file.   Social History Main Topics  . Smoking status: Former Smoker    Packs/day: 0.50    Years: 44.00    Types: Cigarettes    Quit date: 08/15/2016  . Smokeless tobacco: Never Used     Comment: smoke about 5-6 cigarette daily  . Alcohol use No  . Drug use: No  . Sexual activity: Not on file   Other Topics Concern  . Not on file   Social History Narrative  . No narrative on file     Review of Systems   General:  No chills, fever, night sweats or weight changes.  Cardiovascular:  No chest pain, orthopnea, palpitations, paroxysmal nocturnal dyspnea. She has chronic DOE Dermatological: No rash, lesions/masses Respiratory: No cough, She has chronic DOE, significantly improved since admission Urologic: No hematuria, dysuria Abdominal:   No nausea, vomiting, diarrhea, bright red blood per rectum, melena, or hematemesis Neurologic:  No visual changes, wkns, changes in mental  status. All other systems reviewed and are otherwise negative except as noted above.  Physical Exam    Blood pressure (!) 99/57, pulse 86, temperature 97.5 F (36.4 C), temperature source Oral, resp. rate 18, height 5\' 6"  (1.676 m), weight 255 lb 1.6 oz (115.7 kg), SpO2 92 %.  General: Pleasant, morbidly obese female, NAD Psych: Normal affect. Neuro: Alert and oriented X 3. Moves all extremities spontaneously. HEENT: Normal  Neck: Supple without bruits or JVD. Lungs:  Resp regular and unlabored, CTA except few scattered crackles Heart:  Irregularly irregular rhythm,  no s3, s4, or murmurs. Abdomen: Soft, non-tender, non-distended, obese,  BS + x 4.  Extremities: No clubbing, cyanosis. Trace edema. DP/PT/Radials 2+ and equal bilaterally.  Labs    Troponin [facility] of Care Test)  Recent Labs  12/09/16 1806  TROPIPOC 0.00   No results for input(s): CKTOTAL, CKMB, TROPONINI in the last 72 hours. Lab Results  Component Value Date   WBC 10.2 12/09/2016   HGB 10.1 (L) 12/09/2016   HCT 36.2 12/09/2016   MCV 82.6 12/09/2016   PLT 241 12/09/2016    Recent Labs Lab 12/12/16 0428  NA 137  K 4.4  CL 92*  CO2 35*  BUN 38*  CREATININE 0.98  CALCIUM 9.6  GLUCOSE 232*   Lab Results  Component Value Date   CHOL 180 06/06/2016   HDL 75 06/06/2016   LDLCALC 68 06/06/2016   TRIG 187 (H) 06/06/2016   No results found for: [facility]   Radiology Studies    Dg Chest Portable 1 View  Result Date: 12/09/2016 CLINICAL DATA:  71 year old female with shortness of breath and cough for 2-3 days. Initial encounter. EXAM: PORTABLE CHEST 1 VIEW COMPARISON:  11/11/2016 07/31/2016. FINDINGS: Pulmonary edema superimposed upon chronic changes. Right-sided pleural effusion suspected. Basilar atelectasis greater on the right. Cannot exclude basilar infiltrate or mass. Cardiomegaly. Calcified aorta. Shoulder joint degenerative change greater on left. IMPRESSION: Pulmonary edema superimposed upon chronic  changes. Right-sided pleural effusion suspected. Basilar atelectasis greater on the right. Cannot exclude basilar infiltrate or mass. Cardiomegaly. Electronically Signed   By: Genia Del M.D.   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 Pt was on a High Flow   Nasal Cannula on 10L O2 sitting in high fowlers, technically   difficult study. - Left ventricle: Systolic function was vigorous. The estimated   ejection fraction was in the range of 65% to 70%. The study is   not technically sufficient to allow evaluation of LV diastolic   function. - Aortic valve: Mildly calcified leaflets. There was no stenosis. - Mitral valve: Calcified annulus. 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 TEE is recommended for better   visualization. - Tricuspid valve: There was moderate regurgitation. - Pulmonary arteries: PA peak pressure: 61 mm Hg (S). - Inferior vena cava: The vessel was dilated. The respirophasic   diameter changes were blunted (< 50%), consistent with elevated   central venous pressure.  Impressions: - Technically difficult study. Definity contrast given. LVEF   65-70%, calcified aortic and mitral annulus with a calcified   mitral leaflets and a mobile mass noted on the ventricular side   of the valve - this could be vegetation or thrombus. TEE is   recommended for further assessment. There is moderate TR, RVSP 61   mmHg, dilated IVC, no pericardial effusion.  Assessment & Plan    Acute on chronic diastolic CHF (congestive heart failure)  (HCC) -History of acute on chronic diastolic heart failure with normal LV systolic function on 80 mg of Lasix a day in addition to Diamox 125 mg tid.  Weight in the office on 3/23 was 255, on 2/2 was 257, here on admission was 255. -BNP  472.3 (previous 2 BNPs: 352 on 11/11/16, 445 on 07/31/16) and CXR showed pulmonary edema superimposed on chronic changes. -She was diuresed with lasix 60 mg IV BID and then transitioned to her oral dosing after 2 days.  -Her breathing difficulties were likely a combination of diastolic CHF and COPD with more emphasis on COPD. With current treatment for COPD she is breathing much better, near back to baseline pre pt.  -Echo yesterday showed EF 65-70% calcified aortic and mitral annulus with a calcified mitral leaflets and a mobile mass noted on the ventricular side of the valve - this could be vegetation or thrombus. TEE is recommended for further assessment.  -Will plan TEE for tomorrow.  -She has been anti-coagulated for afib with coumadin but her INR has been subtherapeutic. Consider switching to DOAC for more consistent anticoagulation, however, pt states that she was offered this option at her PCP but the copay for DOAC was over $300. She will need to be adequately anticoagulated with therapeutic INR's with coumadin.  Atrial Flutter -Outpatient rate control with cardizem CD 300 mg and bisoprolol 10 mg. Pt denies any chest pain, palpitations or lightheadedness.  -On admission her HR was 110 and she was placed on cardizem drip. She has been transitioned to oral home dosing. Continues atrial flutter with controlled rates in the 80's. -This patients CHA2DS2-VASc Score is at least 4 (HTN, DM, Age, female). She is anticoagulated with coumadin. INR is therapeutic today but she was subtherapeutic on 3/27 and has rarely been therapeutic prior to that. She may do better with a DOAC for better consistency with anticoagulation.  -Continue current meds.  COPD with acute  exacerbation -Long smoking history- quit 3 years ago with one brief relapse.  -Treatment by IM with BiPAP, bronchodilators, azithromycin, Mucinex. Steroid switched to oral dosing.   HTN -Treated with diltiazem and losartan. BP well controlled, a little soft today. Continue to monitor.   Hyperlipidemia -History of hyperlipidemia on statin therapy with recent lipid profile performed 06/06/16 revealed a total cholesterol 180, LDL 68 and HDL of 75. -Continue pravastatin 40 mg  Type 2 diabetes -On metformin at home. Last A1c 5.2.  SignedDaune Perch, NP-C 12/12/2016, 2:11 PM Pager: [phone]  I have seen, examined and evaluated the patient this PM along with Ms. Phylliss Bob, NP-C.  After reviewing all the available data and chart, we discussed the patients laboratory, study & physical findings as well as symptoms in detail. I agree with her findings, examination as well as impression recommendations as per our discussion.    Very pleasant 71 year old woman with long-standing smoking history and subsequent COPD with home oxygen who also has paroxysmal A. fib and currently with Coumadin. Apparently she had had a history of PSVT, but wasn't found to have atrial fibrillation. She likely has some diastolic dysfunction that is exacerbated by atrial fibrillation with diastolic heart failure. She was admitted for what sounds like mostly COPD exacerbation which has notably improved. She had a slight CHF component to it that has resolved with diuresis. As part of her evaluation, she had an echocardiogram that had a concerning finding for a mobile mass in the ventricular aspect of the mitral valve. Based on this finding, TEE is recommended in order to determine if this is a thrombus versus tumor and to determine its effect on the mitral valve itself.  She remains in atrial flutter, with rate control. She is finally now therapeutic with an INR above 2, but has not had stable INRs looking  back. We  discussed the possibility of a DOAC, however this would not be financially feasible for her. Therefore it becomes very crucial for her INR to be stable.  More data will be cleaned after the TEE, but it is possible that we may require a cardiac MRI. If this is the case, I could potentially be done as an outpatient as she is hemodynamically and otherwise stable.  She has been scheduled for TEE tomorrow. Orders in place.    Glenetta Hew, M.D., M.S. Interventional Cardiologist   Pager # [phone] Phone # [phone] [address]. [place], [place] [address]

## 2016-12-12 NOTE — Progress Notes (Signed)
Patient is on the [facility] and was C/O having sharp abdominal pain and her bowel sounds are hyperactive at this time, will medicate with some Zofran for dry heaves and continue to monitor.

## 2016-12-12 NOTE — Progress Notes (Signed)
PROGRESS NOTE    Jillian Wood  YQM:[id number] DOB: 14-Jan-1946 DOA: 12/09/2016 PCP: Alesia Richards, MD   Brief Narrative: 71 year old female with history of COPD with chronic hypoxic respiratory failure (on 5 L via nasal cannula), chronic diastolic CHF, hypertension, hypothyroidism, GERD was sent to the ED by her PCP for shortness of breath and increased oxygen requirement Patient placed on BiPAP on admission. Also went into rapid A. fib. Admitted to stepdown unit on BiPAP and Cardizem drip. Assessment & Plan:  # Acute on chronic respiratory failure with hypoxia: Due to combination of COPD exacerbation and acute on chronic diastolic congestive heart failure. Weaning to 7 liters of oxygen today. Patient uses about 5 L of oxygen at home. Patient is clinically improving.  #COPD with acute exacerbation: On BiPAP. Continue bronchodilators, azithromycin, Mucinex. Discontinue solumedrol and switched to oral prednisone.   #Acute on chronic diastolic congestive heart failure: volume status improved, on oral lasix now. Resume home dose acetazolamide.  -Echocardiogram showed EF of 65-70%. Echocardiogram also showed a mobile mass noted on the ventricular side of the mitral valve this could be vegetation or thrombus. I consulted cardiologist to further evaluate the patient. Defer to cardiologist if patient needs further evaluation including a TEE.  #Atrial flutter with rapid ventricular response: Off Cardizem drip and now on oral Cardizem. Continue bisoprolol, Coumadin. Monitor INR. Monitor heart rate.  #Type 2 diabetes: On metformin at home. Last A1c 5.2. Continue sliding scale. Monitor blood sugar level.  #Essential hypertension: Blood pressure borderline low this am. Low lasix dose to 40 mg bid. Continue to monitor.   #Hypothyroidism: Continue Synthroid.  Principal Problem:   COPD with acute exacerbation ([place]) Active Problems:   Essential hypertension   Hypothyroidism   Acute on  chronic diastolic CHF (congestive heart failure) (HCC)   GERD (gastroesophageal reflux disease)   Acute on chronic respiratory failure with hypoxia (HCC)   Atrial flutter with rapid ventricular response ([place])   Diabetes mellitus type 2 in obese (HCC)  DVT prophylaxis: on Coumadin  Code Status: full code  Family Communication: no family present at bedside. Disposition Plan:likely discharge home with home care. PT OT evaluation     Consultants:   cardiology  Procedures: BiPAP  Antimicrobials:Azithromycin   Subjective: Patient was seen and examined at bedside. Requiring 7 L of oxygen. Denied chest pain, shortness of breath, nausea or vomiting.   Objective: Vitals:   12/12/16 0549 12/12/16 0726 12/12/16 0826 12/12/16 1127  BP: (!) 142/99 100/68  (!) 99/57  Pulse:  69  86  Resp:  20  18  Temp:  97.6 F (36.4 C)  97.5 F (36.4 C)  TempSrc:  Oral  Oral  SpO2:  90% 91% 92%  Weight:      Height:        Intake/Output Summary (Last 24 hours) at 12/12/16 1253 Last data filed at 12/12/16 1128  Gross per 24 hour  Intake              480 ml  Output             1800 ml  Net            -1320 ml   Filed Weights   12/10/16 0318 12/11/16 0610 12/12/16 0516  Weight: 116.2 kg (256 lb 1.6 oz) 114.8 kg (253 lb 1.6 oz) 115.7 kg (255 lb 1.6 oz)    Examination:  General exam: Lying on bed comfortable, not in distress Respiratory: clear bilateral, no wheezing or crackle.  Respiratory effort normal.  Cardiovascular system: Regular rate rhythm, S1-S2 normal.  Gastrointestinal system: Abdomen is nondistended, soft and nontender. Normal bowel sounds heard. Central nervous system: Alert and oriented. No focal neurological deficits. Extremities: Symmetric 5 x 5 power. Lower extremities edema improved Skin: No rashes, lesions or ulcers Psychiatry: Judgement and insight appear normal. Mood & affect appropriate.     Data Reviewed: I have personally reviewed following labs and imaging  studies  CBC:  Recent Labs Lab 12/09/16 1750  WBC 10.2  HGB 10.1*  HCT 36.2  MCV 82.6  PLT 419   Basic Metabolic Panel:  Recent Labs Lab 12/09/16 1750 12/10/16 0240 12/11/16 0326 12/12/16 0428  NA 140 139 139 137  K 4.3 3.8 3.9 4.4  CL 99* 97* 93* 92*  CO2 30 31 33* 35*  GLUCOSE 129* 188* 212* 232*  BUN 10 14 22* 38*  CREATININE 0.65 0.63 0.82 0.98  CALCIUM 9.2 9.3 9.4 9.6   GFR: Estimated Creatinine Clearance: 69.1 mL/min (by C-G formula based on SCr of 0.98 mg/dL). Liver Function Tests: No results for input(s): AST, ALT, ALKPHOS, BILITOT, PROT, ALBUMIN in the last 168 hours. No results for input(s): LIPASE, AMYLASE in the last 168 hours. No results for input(s): AMMONIA in the last 168 hours. Coagulation Profile:  Recent Labs Lab 12/09/16 1750 12/10/16 0240 12/11/16 0326 12/12/16 0428  INR 1.68 1.68 2.45 2.55   Cardiac Enzymes: No results for input(s): CKTOTAL, CKMB, CKMBINDEX, TROPONINI in the last 168 hours. BNP (last 3 results) No results for input(s): PROBNP in the last 8760 hours. HbA1C: No results for input(s): HGBA1C in the last 72 hours. CBG:  Recent Labs Lab 12/11/16 1104 12/11/16 1534 12/11/16 2125 12/12/16 0724 12/12/16 1125  GLUCAP 278* 166* 235* 232* 290*   Lipid Profile: No results for input(s): CHOL, HDL, LDLCALC, TRIG, CHOLHDL, LDLDIRECT in the last 72 hours. Thyroid Function Tests: No results for input(s): TSH, T4TOTAL, FREET4, T3FREE, THYROIDAB in the last 72 hours. Anemia Panel: No results for input(s): VITAMINB12, FOLATE, FERRITIN, TIBC, IRON, RETICCTPCT in the last 72 hours. Sepsis Labs: No results for input(s): PROCALCITON, LATICACIDVEN in the last 168 hours.  Recent Results (from the past 240 hour(s))  MRSA PCR Screening     Status: None   Collection Time: 12/09/16 11:37 PM  Result Value Ref Range Status   MRSA by PCR NEGATIVE NEGATIVE Final    Comment:        The GeneXpert MRSA Assay (FDA approved for NASAL  specimens only), is one component of a comprehensive MRSA colonization surveillance program. It is not intended to diagnose MRSA infection nor to guide or monitor treatment for MRSA infections.          Radiology Studies: No results found.      Scheduled Meds: . aspirin  81 mg Oral Daily  . azithromycin  500 mg Intravenous Q24H  . bisoprolol  10 mg Oral Daily  . diltiazem  300 mg Oral Daily  . DULoxetine  60 mg Oral Daily  . famotidine  20 mg Oral BID  . ferrous sulfate  325 mg Oral BID WC  . furosemide  40 mg Oral BID  . guaiFENesin  600 mg Oral BID  . insulin aspart  0-15 Units Subcutaneous TID WC  . insulin aspart  0-5 Units Subcutaneous QHS  . ipratropium-albuterol  3 mL Nebulization TID  . levothyroxine  175 mcg Oral QAC breakfast  . losartan  50 mg Oral Daily  . mometasone-formoterol  2 puff Inhalation BID  . pravastatin  40 mg Oral q1800  . [START ON 12/13/2016] predniSONE  50 mg Oral Q breakfast  . sodium chloride flush  3 mL Intravenous Q12H  . Warfarin - Pharmacist Dosing Inpatient   Does not apply q1800   Continuous Infusions:    LOS: 3 days    Dron Tanna Furry, MD [facility] Pager [phone]  If 7PM-7AM, please contact night-coverage [email or web address] Password [facility] 12/12/2016, 12:53 PM

## 2016-12-12 NOTE — Progress Notes (Signed)
qPhysical Therapy Treatment Patient Details Name: Jillian Wood MRN: [id number] DOB: 04/02/1946 Today's Date: 12/12/2016    History of Present Illness Pt is a 71 y.o. female with medical history significant for COPD, chronic hypoxic respiratory failure, chronic diastolic CHF, hypertension, hypothyroidism, and GERD who presents with c/o dyspnea and hypoxia.    PT Comments    Patient progressing well towards PT goals. Continues to exhibit generalized weakness/deconditioning, chronic back pain and dyspnea on exertion. Tolerated gait training today with min guard assist for safety. Sp02 dropped to 85% on 8L/min 02 HFNC. Pt fatigues easily and requires longer rest breaks. Encouraged ambulation to/from bathroom as tolerated. Ambulates only household distances at home and has BSC. Will follow.   Follow Up Recommendations  Home health PT;Supervision - Intermittent     Equipment Recommendations  None recommended by PT    Recommendations for Other Services       Precautions / Restrictions Precautions Precautions: Fall Precaution Comments: watch O2 Restrictions Weight Bearing Restrictions: No    Mobility  Bed Mobility               General bed mobility comments: Sitting EOB upon PT arrival.   Transfers Overall transfer level: Needs assistance Equipment used: Rolling walker (2 wheeled) Transfers: Sit to/from Stand Sit to Stand: Min guard         General transfer comment: Min guard for safety. Increased time.   Ambulation/Gait Ambulation/Gait assistance: Min guard Ambulation Distance (Feet): 20 Feet Assistive device: Rolling walker (2 wheeled) Gait Pattern/deviations: Step-through pattern;Decreased stride length;Trunk flexed Gait velocity: decreased Gait velocity interpretation: Below normal speed for age/gender General Gait Details: Very slow, guarded gait; 2/4 DOE. Sp02 dropped to 85% on 8L/min 02 HFNC. A few short standing rest breaks.   Stairs             Wheelchair Mobility    Modified Rankin (Stroke Patients Only)       Balance Overall balance assessment: Needs assistance Sitting-balance support: Feet supported;No upper extremity supported Sitting balance-Leahy Scale: Good     Standing balance support: During functional activity;Bilateral upper extremity supported Standing balance-Leahy Scale: Poor Standing balance comment: Reliant on BUEs for support in standing.                             Cognition Arousal/Alertness: Awake/alert Behavior During Therapy: WFL for tasks assessed/performed Overall Cognitive Status: Within Functional Limits for tasks assessed                                        Exercises      General Comments General comments (skin integrity, edema, etc.): Sp02 ranged from 85-93% on 8L/min 02 HFNC.      Pertinent Vitals/Pain Pain Assessment: 0-10 Pain Score: 5  Pain Location: back Pain Descriptors / Indicators: Sore;Aching Pain Intervention(s): Monitored during session;Repositioned    Home Living                      Prior Function            PT Goals (current goals can now be found in the care plan section) Progress towards PT goals: Progressing toward goals    Frequency    Min 3X/week      PT Plan Current plan remains appropriate    Co-evaluation  End of Session Equipment Utilized During Treatment: Gait belt;Oxygen (8L 02 HFNC) Activity Tolerance: Patient limited by fatigue;Other (comment) (DOE.) Patient left: in bed;with call bell/phone within reach;with nursing/sitter in room (sitting EOB.) Nurse Communication: Mobility status PT Visit Diagnosis: Other abnormalities of gait and mobility (R26.89);Muscle weakness (generalized) (M62.81)     Time: 3875-6433 PT Time Calculation (min) (ACUTE ONLY): 19 min  Charges:  $Gait Training: 8-22 mins                    G Codes:       Wray Kearns, PT,  DPT [phone]     [place] 12/12/2016, 11:02 AM

## 2016-12-12 NOTE — Plan of Care (Signed)
Problem: Health Behavior/Discharge Planning: Goal: Ability to manage health-related needs will improve Outcome: Progressing Wean Oxygen as tolerated.

## 2016-12-12 NOTE — Progress Notes (Signed)
[place] for warfarin  Indication: atrial fibrillation  Allergies  Allergen Reactions  . Inderal [Propranolol] Other (See Comments)    Hair loss  . Ace Inhibitors Cough    Patient Measurements: Height: 5\' 6"  (964.3 cm) Weight: 255 lb 1.6 oz (115.7 kg) IBW/kg (Calculated) : 59.3   Vital Signs: Temp: 97.6 F (36.4 C) (03/29 0726) Temp Source: Oral (03/29 0726) BP: 100/68 (03/29 0726) Pulse Rate: 69 (03/29 0726)  Labs:  Recent Labs  12/09/16 1750 12/10/16 0240 12/11/16 0326 12/12/16 0428  HGB 10.1*  --   --   --   HCT 36.2  --   --   --   PLT 241  --   --   --   LABPROT 20.0* 20.0* 27.0* 27.9*  INR 1.68 1.68 2.45 2.55  CREATININE 0.65 0.63 0.82 0.98    Estimated Creatinine Clearance: 69.1 mL/min (by C-G formula based on SCr of 0.98 mg/dL).  Assessment: 71 yo female on warfarin PTA for atrial fibrillation. INR on admission subtherapeutic at 1.68.  Anticoag: warfarin PTA for afib. INR 1.68 admit>2.45>2.55 overnight. Held warfarin 3/28. Anemia with Hgb 10.1. - PTA 7.5 mg on Tuesday and Thursday and 5 mg AOD's (INR 1.68 on admit)  Goal of Therapy:  INR 2-3 Monitor platelets by anticoagulation protocol: Yes   Plan:  Hold warfarin one more night Daily INR   Andreas Sobolewski S. Alford Highland, PharmD, [facility] Clinical Staff Pharmacist Pager [phone]  12/12/2016 9:31 AM

## 2016-12-12 NOTE — Progress Notes (Signed)
Pt was mentioning that she hasn't had a BM since Friday so I text paged [facility] for something for constipation, she was also medicated for chronic low back pain, will continue to monitor.

## 2016-12-12 NOTE — Progress Notes (Signed)
Patient placed on BiPAP for the evening. RT will continue to monitor.

## 2016-12-12 NOTE — Progress Notes (Signed)
Report received in patient's room via [facility] RN using SBAR format, reviewed orders, labs, VS, tests and patient's general condition, assumed care of patent.

## 2016-12-12 NOTE — Care Management Note (Signed)
Case Management Note  Patient Details  Name: Jillian Wood MRN: [id number] Date of Birth: 1945/12/15  Subjective/Objective:    Pt presented for COPD exacerbation. Pt is from home with husband and adult children. Pt has DME 02 via [employer]-will have travel tank for home, Cane, Rollator, Rw and 3n1.                 Action/Plan: PT/OT recommendations for [facility]. Pt is refusing Services at this time. Pt states she has had services before and were not beneficial. No further needs from CM at this time.   Expected Discharge Date:                  Expected Discharge Plan:  Home/Self Care  In-House Referral:  NA  Discharge planning Services  CM Consult  Post Acute Care Choice:  NA Choice offered to:  NA  DME Arranged:  N/A DME Agency:  NA  HH Arranged:  NA, Patient Refused [place] Agency:  NA  Status of Service:  Completed, signed off  If discussed at [place] of Stay Meetings, dates discussed:    Additional Comments:  Bethena Roys, RN 12/12/2016, 2:49 PM

## 2016-12-12 NOTE — Progress Notes (Signed)
    [facility] has been requested to perform a transesophageal echocardiogram on CHENITA RUDA to assess mobile mass on MV/ possible thrombus.  After careful review of history and examination, the risks and benefits of transesophageal echocardiogram have been explained including risks of esophageal damage, perforation (1:10,000 risk), bleeding, pharyngeal hematoma as well as other potential complications associated with conscious sedation including aspiration, arrhythmia, respiratory failure and death. Alternatives to treatment were discussed, questions were answered. Patient is willing to proceed.   She is scheduled for 4:00 pm tomorrow with Dr. Marlou Porch. She can have a clear liquid breakfast.   Daune Perch, NP  12/12/2016 4:51 PM

## 2016-12-13 ENCOUNTER — Other Ambulatory Visit: Payer: Self-pay | Admitting: Internal Medicine

## 2016-12-13 DIAGNOSIS — J441 Chronic obstructive pulmonary disease with (acute) exacerbation: Secondary | ICD-10-CM

## 2016-12-13 LAB — PROTIME-INR
INR: 2.13
Prothrombin Time: 24.2 seconds — ABNORMAL HIGH (ref 11.4–15.2)

## 2016-12-13 LAB — GLUCOSE, CAPILLARY
GLUCOSE-CAPILLARY: 210 mg/dL — AB (ref 65–99)
GLUCOSE-CAPILLARY: 234 mg/dL — AB (ref 65–99)
GLUCOSE-CAPILLARY: 495 mg/dL — AB (ref 65–99)
Glucose-Capillary: 173 mg/dL — ABNORMAL HIGH (ref 65–99)

## 2016-12-13 MED ORDER — WARFARIN SODIUM 7.5 MG PO TABS
7.5000 mg | ORAL_TABLET | Freq: Once | ORAL | Status: AC
  Administered 2016-12-13: 7.5 mg via ORAL
  Filled 2016-12-13: qty 1

## 2016-12-13 NOTE — Progress Notes (Signed)
Pt can eat TEE rescheduled for Monday at 0800 per MD. Rosana Fret RN

## 2016-12-13 NOTE — Progress Notes (Signed)
    Plan for today is TEE to assess mass noted on TTE.  Otherwise stable from cardiac standpoint.  [facility] see over the weekend to f/u on TEE results.  Glenetta Hew, MD

## 2016-12-13 NOTE — Care Management Important Message (Signed)
Important Message  Patient Details  Name: Jillian Wood MRN: [id number] Date of Birth: Mar 01, 1946   Medicare Important Message Given:  Yes    Willmar Stockinger [place] 12/13/2016, 11:00 AM

## 2016-12-13 NOTE — Progress Notes (Signed)
qPhysical Therapy Treatment Patient Details Name: Jillian Wood MRN: [id number] DOB: 1946/02/19 Today's Date: 12/13/2016    History of Present Illness Pt is a 71 y.o. female with medical history significant for COPD, chronic hypoxic respiratory failure, chronic diastolic CHF, hypertension, hypothyroidism, and GERD who presents with c/o dyspnea and hypoxia.    PT Comments    Patient progressing slowly towards PT goals. Pt continues to be limited by weakness and fatigue. Pt with 1 LOB during gait training today requiring Min A to correct. Dyspnea seems improving but Sp02 dropped to 83% on 8 L02 HFNC.  Encouraged increasing activity/mobility to improve strength. HR ranged from 80-117 bpm during session. Will require supervision for OOB mobility at home until strength improves. Plan for TEE today. Will follow.   Follow Up Recommendations  Home health PT;Supervision for mobility/OOB     Equipment Recommendations  None recommended by PT    Recommendations for Other Services       Precautions / Restrictions Precautions Precautions: Fall Precaution Comments: watch O2 Restrictions Weight Bearing Restrictions: No    Mobility  Bed Mobility               General bed mobility comments: Sitting EOB upon PT arrival.   Transfers Overall transfer level: Needs assistance Equipment used: Rolling walker (2 wheeled) Transfers: Sit to/from Stand Sit to Stand: Min guard         General transfer comment: Min guard for safety. Increased time. Stood from [employer], from toilet x1. Transferred to chair in front of sink for bath post ambulation.  Ambulation/Gait Ambulation/Gait assistance: Min assist Ambulation Distance (Feet): 15 Feet (+ 6') Assistive device: Rolling walker (2 wheeled) Gait Pattern/deviations: Step-through pattern;Decreased stride length;Trunk flexed Gait velocity: decreased Gait velocity interpretation: Below normal speed for age/gender General Gait Details: Very  slow, guarded gait with 1 LOB requiring Min A to correct; 2/4 DOE. Sp02 dropped to 83% on 8L/min 02 HFNC.    Stairs            Wheelchair Mobility    Modified Rankin (Stroke Patients Only)       Balance Overall balance assessment: Needs assistance Sitting-balance support: Feet supported;No upper extremity supported Sitting balance-Leahy Scale: Good     Standing balance support: During functional activity Standing balance-Leahy Scale: Poor Standing balance comment: Reliant on BUEs for support in standing.                             Cognition Arousal/Alertness: Awake/alert Behavior During Therapy: WFL for tasks assessed/performed Overall Cognitive Status: Within Functional Limits for tasks assessed                                        Exercises      General Comments General comments (skin integrity, edema, etc.): HR ranged from 80s-117 bpm during session.       Pertinent Vitals/Pain Pain Assessment: Faces Faces Pain Scale: Hurts little more Pain Location: back Pain Descriptors / Indicators: Sore;Aching Pain Intervention(s): Monitored during session;Repositioned    Home Living                      Prior Function            PT Goals (current goals can now be found in the care plan section) Progress towards PT goals: Progressing toward  goals    Frequency    Min 3X/week      PT Plan Current plan remains appropriate    Co-evaluation             End of Session Equipment Utilized During Treatment: Gait belt;Oxygen (7-8L 02 HFNC) Activity Tolerance: Patient limited by fatigue;Other (comment) Patient left: in chair;with call bell/phone within reach;Other (comment) (tech notified pt waiting for bath in chair at sink) Nurse Communication: Mobility status PT Visit Diagnosis: Other abnormalities of gait and mobility (R26.89);Muscle weakness (generalized) (M62.81)     Time: 1735-6701 PT Time Calculation (min)  (ACUTE ONLY): 23 min  Charges:  $Gait Training: 8-22 mins $Therapeutic Activity: 8-22 mins                    G Codes:       Wray Kearns, PT, DPT [phone]     Marguarite Arbour A Vitaly Wanat 12/13/2016, 11:15 AM

## 2016-12-13 NOTE — Progress Notes (Addendum)
PROGRESS NOTE    Jillian Wood  XFG:[id number] DOB: 10/06/1945 DOA: 12/09/2016 PCP: Alesia Richards, MD   Brief Narrative: 71 year old female with history of COPD with chronic hypoxic respiratory failure (on 5 L via nasal cannula), chronic diastolic CHF, hypertension, hypothyroidism, GERD was sent to the ED by her PCP for shortness of breath and increased oxygen requirement Patient placed on BiPAP on admission. Also went into rapid A. fib. Admitted to stepdown unit on BiPAP and Cardizem drip. Assessment & Plan:  # Acute on chronic respiratory failure with hypoxia: Due to combination of COPD exacerbation and acute on chronic diastolic congestive heart failure. Still requiring several liters of oxygen, try to wean down slowly. Patient uses about 5 L of oxygen at home. Patient is clinically improving.  #COPD with acute exacerbation: On BiPAP. Continue bronchodilators, azithromycin, Mucinex. Continue oral prednisone. Clinically stable.  #Acute on chronic diastolic congestive heart failure: volume status improved, on oral lasix and home dose of acetazolamide.  -Echocardiogram showed EF of 65-70%. Echocardiogram also showed a mobile mass noted on the ventricular side of the mitral valve this could be vegetation or thrombus. Cardiology consult obtained. Plan for TEE today. Patient does not have chest pain. I discussed with the cardiologist team today.  #Atrial flutter with rapid ventricular response: Off Cardizem drip and now on oral Cardizem. Continue bisoprolol, Coumadin. Monitor INR. Monitor heart rate.  #Type 2 diabetes: On metformin at home. Last A1c 5.2. Continue sliding scale. Monitor blood sugar level.  #Essential hypertension: Blood pressure acceptable. Continue to monitor.   #Hypothyroidism: Continue Synthroid.  Principal Problem:   COPD with acute exacerbation ([place]) Active Problems:   Essential hypertension   Hypothyroidism   Acute on chronic diastolic CHF (congestive  heart failure) (HCC)   GERD (gastroesophageal reflux disease)   Acute on chronic respiratory failure with hypoxia (HCC)   Atrial flutter with rapid ventricular response ([place])   Diabetes mellitus type 2 in obese (HCC)  DVT prophylaxis: on Coumadin  Code Status: full code  Family Communication: no family present at bedside. Disposition Plan:likely discharge home with home care. PT OT evaluation     Consultants:   cardiology  Procedures: BiPAP  Antimicrobials:Azithromycin   Subjective: Patient was seen and examined at bedside. Denied chest pain, shortness of breath, nausea or vomiting. Reported sister and well. Plan for TEE today.   Objective: Vitals:   12/13/16 0555 12/13/16 0730 12/13/16 0834 12/13/16 1136  BP:  111/69  121/81  Pulse:  (!) 105  97  Resp:  18  18  Temp: 97.2 F (36.2 C) (!) 96.8 F (36 C)  97.9 F (36.6 C)  TempSrc: Axillary Axillary  Oral  SpO2:  100% 100% 95%  Weight: 116.2 kg (256 lb 2.8 oz)     Height:        Intake/Output Summary (Last 24 hours) at 12/13/16 1341 Last data filed at 12/13/16 0400  Gross per 24 hour  Intake                0 ml  Output             1500 ml  Net            -1500 ml   Filed Weights   12/11/16 0610 12/12/16 0516 12/13/16 0555  Weight: 114.8 kg (253 lb 1.6 oz) 115.7 kg (255 lb 1.6 oz) 116.2 kg (256 lb 2.8 oz)    Examination:  General exam: Lying on bed comfortable, not in distress Respiratory: Clear  bilateral, no wheezing or crackles. Respiratory effort normal.  Cardiovascular system: Regular rate rhythm, S1 is normal.  Gastrointestinal system: Abdomen is nondistended, soft and nontender. Normal bowel sounds heard. Central nervous system: Alert and oriented. No focal neurological deficits. Extremities: Symmetric 5 x 5 power. Lower extremities edema improved Skin: No rashes, lesions or ulcers Psychiatry: Judgement and insight appear normal. Mood & affect appropriate.     Data Reviewed: I have personally  reviewed following labs and imaging studies  CBC:  Recent Labs Lab 12/09/16 1750  WBC 10.2  HGB 10.1*  HCT 36.2  MCV 82.6  PLT 242   Basic Metabolic Panel:  Recent Labs Lab 12/09/16 1750 12/10/16 0240 12/11/16 0326 12/12/16 0428  NA 140 139 139 137  K 4.3 3.8 3.9 4.4  CL 99* 97* 93* 92*  CO2 30 31 33* 35*  GLUCOSE 129* 188* 212* 232*  BUN 10 14 22* 38*  CREATININE 0.65 0.63 0.82 0.98  CALCIUM 9.2 9.3 9.4 9.6   GFR: Estimated Creatinine Clearance: 69.2 mL/min (by C-G formula based on SCr of 0.98 mg/dL). Liver Function Tests: No results for input(s): AST, ALT, ALKPHOS, BILITOT, PROT, ALBUMIN in the last 168 hours. No results for input(s): LIPASE, AMYLASE in the last 168 hours. No results for input(s): AMMONIA in the last 168 hours. Coagulation Profile:  Recent Labs Lab 12/09/16 1750 12/10/16 0240 12/11/16 0326 12/12/16 0428 12/13/16 0342  INR 1.68 1.68 2.45 2.55 2.13   Cardiac Enzymes: No results for input(s): CKTOTAL, CKMB, CKMBINDEX, TROPONINI in the last 168 hours. BNP (last 3 results) No results for input(s): PROBNP in the last 8760 hours. HbA1C: No results for input(s): HGBA1C in the last 72 hours. CBG:  Recent Labs Lab 12/12/16 1125 12/12/16 1630 12/12/16 2113 12/13/16 0729 12/13/16 1138  GLUCAP 290* 213* 293* 210* 234*   Lipid Profile: No results for input(s): CHOL, HDL, LDLCALC, TRIG, CHOLHDL, LDLDIRECT in the last 72 hours. Thyroid Function Tests: No results for input(s): TSH, T4TOTAL, FREET4, T3FREE, THYROIDAB in the last 72 hours. Anemia Panel: No results for input(s): VITAMINB12, FOLATE, FERRITIN, TIBC, IRON, RETICCTPCT in the last 72 hours. Sepsis Labs: No results for input(s): PROCALCITON, LATICACIDVEN in the last 168 hours.  Recent Results (from the past 240 hour(s))  MRSA PCR Screening     Status: None   Collection Time: 12/09/16 11:37 PM  Result Value Ref Range Status   MRSA by PCR NEGATIVE NEGATIVE Final    Comment:          The GeneXpert MRSA Assay (FDA approved for NASAL specimens only), is one component of a comprehensive MRSA colonization surveillance program. It is not intended to diagnose MRSA infection nor to guide or monitor treatment for MRSA infections.          Radiology Studies: No results found.      Scheduled Meds: . acetaZOLAMIDE  250 mg Oral BID  . aspirin  81 mg Oral Daily  . azithromycin  500 mg Intravenous Q24H  . bisoprolol  10 mg Oral Daily  . diltiazem  300 mg Oral Daily  . DULoxetine  60 mg Oral Daily  . famotidine  20 mg Oral BID  . ferrous sulfate  325 mg Oral BID WC  . furosemide  40 mg Oral BID  . guaiFENesin  600 mg Oral BID  . insulin aspart  0-15 Units Subcutaneous TID WC  . insulin aspart  0-5 Units Subcutaneous QHS  . ipratropium-albuterol  3 mL Nebulization TID  . levothyroxine  175 mcg Oral QAC breakfast  . losartan  50 mg Oral Daily  . mometasone-formoterol  2 puff Inhalation BID  . pravastatin  40 mg Oral q1800  . predniSONE  50 mg Oral Q breakfast  . sodium chloride flush  3 mL Intravenous Q12H  . warfarin  7.5 mg Oral ONCE-1800  . Warfarin - Pharmacist Dosing Inpatient   Does not apply q1800   Continuous Infusions:    LOS: 4 days    Muhammadali Ries Tanna Furry, MD [facility] Pager [phone]  If 7PM-7AM, please contact night-coverage [email or web address] Password [email or web address] 12/13/2016, 1:41 PM

## 2016-12-13 NOTE — Progress Notes (Signed)
ANTICOAGULATION CONSULT NOTE - Follow Up Consult  Pharmacy Consult for Coumadin Indication: Afib  Allergies  Allergen Reactions  . Inderal [Propranolol] Other (See Comments)    Hair loss  . Ace Inhibitors Cough    Patient Measurements: Height: 5\' 6"  (167.6 cm) Weight: 256 lb 2.8 oz (116.2 kg) IBW/kg (Calculated) : 59.3  Vital Signs: Temp: 96.8 F (36 C) (03/30 0730) Temp Source: Axillary (03/30 0730) BP: 111/69 (03/30 0730) Pulse Rate: 105 (03/30 0730)  Labs:  Recent Labs  12/11/16 0326 12/12/16 0428 12/13/16 0342  LABPROT 27.0* 27.9* 24.2*  INR 2.45 2.55 2.13  CREATININE 0.82 0.98  --     Estimated Creatinine Clearance: 69.2 mL/min (by C-G formula based on SCr of 0.98 mg/dL).   Assessment:  Anticoag: warfarin PTA for afib. INR 1.68 admit>2.45>2.55>2.13 overnight. Held warfarin 3/28 and 3/29. Anemia with Hgb 10.1. Now mass noted on TTE - PTA 7.5 mg on Tuesday and Thursday and 5 mg AOD's (INR 1.68 on admit)   Goal of Therapy:  INR 2-3 Monitor platelets by anticoagulation protocol: Yes   Plan:  Coumadin 7.5mg  po x 1 tonight. Daily INR Plan for today is TEE to assess mass noted on TTE.   Alford Highland, [employer] 12/13/2016,11:11 AM

## 2016-12-13 NOTE — Progress Notes (Signed)
RT placed patient on BIPAP for the night. Patient tolerating well with no respiratory distress noted at this time. RT will continue to monitor as needed.

## 2016-12-14 LAB — GLUCOSE, CAPILLARY
Glucose-Capillary: 158 mg/dL — ABNORMAL HIGH (ref 65–99)
Glucose-Capillary: 128 mg/dL — ABNORMAL HIGH (ref 65–99)
Glucose-Capillary: 226 mg/dL — ABNORMAL HIGH (ref 65–99)
Glucose-Capillary: 259 mg/dL — ABNORMAL HIGH (ref 65–99)
Glucose-Capillary: 269 mg/dL — ABNORMAL HIGH (ref 65–99)

## 2016-12-14 LAB — PROTIME-INR
INR: 1.98
PROTHROMBIN TIME: 22.8 s — AB (ref 11.4–15.2)

## 2016-12-14 MED ORDER — AZITHROMYCIN 250 MG PO TABS
500.0000 mg | ORAL_TABLET | ORAL | Status: DC
  Administered 2016-12-14 – 2016-12-16 (×3): 500 mg via ORAL
  Filled 2016-12-14 (×3): qty 2

## 2016-12-14 MED ORDER — WARFARIN SODIUM 5 MG PO TABS
5.0000 mg | ORAL_TABLET | Freq: Once | ORAL | Status: AC
Start: 2016-12-14 — End: 2016-12-14
  Administered 2016-12-14: 5 mg via ORAL
  Filled 2016-12-14: qty 1

## 2016-12-14 NOTE — Progress Notes (Signed)
ANTICOAGULATION CONSULT NOTE - Follow Up Consult  Pharmacy Consult for Coumadin Indication: Afib  Allergies  Allergen Reactions  . Inderal [Propranolol] Other (See Comments)    Hair loss  . Ace Inhibitors Cough    Patient Measurements: Height: 5\' 6"  (167.6 cm) Weight: 256 lb 9.6 oz (116.4 kg) IBW/kg (Calculated) : 59.3  Vital Signs: Temp: 98.3 F (36.8 C) (03/31 0312) BP: 116/92 (03/31 0312) Pulse Rate: 100 (03/31 0312)  Labs:  Recent Labs  12/12/16 0428 12/13/16 0342 12/14/16 0252  LABPROT 27.9* 24.2* 22.8*  INR 2.55 2.13 1.98  CREATININE 0.98  --   --     Estimated Creatinine Clearance: 69.2 mL/min (by C-G formula based on SCr of 0.98 mg/dL).   Assessment: 46 yoF on warfarin PTA for atrial fibrillation to resume per pharmacy. INR 1.68 on admit trended up to 2.45 quickly upon restart but is now subtherapeutic at 1.98 after holding 3/28 and 3/29. Will not give boosted dose tonight given rapid response to higher doses earlier in admit, and INR will likely trend up after 7.5mg  x1 dose yesterday. TTE noted mass, plan for TEE on Monday to evaluate per cards.  PTA Warfarin Dose = 7.5mg  Tues/Thurs, 5mg  all other days  Goal of Therapy:  INR 2-3 Monitor platelets by anticoagulation protocol: Yes   Plan:  -Coumadin 5mg  po x1 tonight. -Daily INR, S/Sx bleeding  Arrie Senate, PharmD PGY-1 Pharmacy Resident Pager: [phone] 12/14/2016

## 2016-12-14 NOTE — Progress Notes (Signed)
PROGRESS NOTE    Jillian Wood  OTL:[id number] DOB: 1946-05-14 DOA: 12/09/2016 PCP: Alesia Richards, MD   Brief Narrative: 71 year old female with history of COPD with chronic hypoxic respiratory failure (on 5 L via nasal cannula), chronic diastolic CHF, hypertension, hypothyroidism, GERD was sent to the ED by her PCP for shortness of breath and increased oxygen requirement Patient placed on BiPAP on admission. Also went into rapid A. fib. Admitted to stepdown unit on BiPAP and Cardizem drip. Assessment & Plan:  # Acute on chronic respiratory failure with hypoxia: Due to combination of COPD exacerbation and acute on chronic diastolic congestive heart failure. Patient is requiring about 7 L of oxygen, we will try to wean down to at least home dose of 5 L. Denied shortness of breath or chest pain today.  #COPD with acute exacerbation: On BiPAP. Continue bronchodilators, azithromycin, Mucinex. Continue oral prednisone. Clinically stable.  #Acute on chronic diastolic congestive heart failure: volume status improved, on oral lasix and home dose of acetazolamide.  -Echocardiogram showed EF of 65-70%. Echocardiogram also showed a mobile mass noted on the ventricular side of the mitral valve this could be vegetation or thrombus. Cardiology consult obtained. Plan for TEE Monday vs outpatient. Patient does not have chest pain. If patient remains clinically stable and is able to wean down oxygen to 5-6 L by tomorrow she may go home with home care services with outpatient follow-up. Will continue to monitor for now.  #Atrial flutter with rapid ventricular response: Off Cardizem drip and now on oral Cardizem. Continue bisoprolol, Coumadin. Monitor INR. Monitor heart rate.  #Type 2 diabetes: On metformin at home. Last A1c 5.2. Continue sliding scale. Monitor blood sugar level.  #Essential hypertension: Blood pressure acceptable. Continue to monitor.   #Hypothyroidism: Continue  Synthroid.  Principal Problem:   COPD with acute exacerbation ([place]) Active Problems:   Essential hypertension   Hypothyroidism   Acute on chronic diastolic CHF (congestive heart failure) (HCC)   GERD (gastroesophageal reflux disease)   Acute on chronic respiratory failure with hypoxia (HCC)   Atrial flutter with rapid ventricular response ([place])   Diabetes mellitus type 2 in obese (HCC)  DVT prophylaxis: on Coumadin  Code Status: full code  Family Communication: no family present at bedside. Disposition Plan:likely discharge home with home care. PT OT evaluation     Consultants:   cardiology  Procedures: BiPAP  Antimicrobials:Azithromycin   Subjective: Patient was seen and examined at bedside. Denied headache, dizziness, chest pain, shortness of breath, nausea or vomiting. Objective: Vitals:   12/14/16 0054 12/14/16 0312 12/14/16 0719 12/14/16 0809  BP: 109/89 (!) 116/92    Pulse: (!) 105 100  88  Resp: 19 20  15   Temp: 98.2 F (36.8 C) 98.3 F (36.8 C)  98.2 F (36.8 C)  TempSrc:    Oral  SpO2: 97% 92% 93% 96%  Weight:  116.4 kg (256 lb 9.6 oz)    Height:        Intake/Output Summary (Last 24 hours) at 12/14/16 1040 Last data filed at 12/14/16 0900  Gross per 24 hour  Intake              480 ml  Output             1700 ml  Net            -1220 ml   Filed Weights   12/12/16 0516 12/13/16 0555 12/14/16 0312  Weight: 115.7 kg (255 lb 1.6 oz) 116.2 kg (256  lb 2.8 oz) 116.4 kg (256 lb 9.6 oz)    Examination:  General exam: Lying on bed comfortable, not in distress Respiratory: Clear bilateral, no wheezing or crackles. Respiratory effort normal.  Cardiovascular system: Regular rate rhythm, S1 is normal.  Gastrointestinal system: Abdomen is nondistended, soft and nontender. Normal bowel sounds heard. Central nervous system: Alert and oriented. No focal neurological deficits. Extremities: Symmetric 5 x 5 power. Lower extremities edema improved Skin: No rashes,  lesions or ulcers Psychiatry: Judgement and insight appear normal. Mood & affect appropriate.     Data Reviewed: I have personally reviewed following labs and imaging studies  CBC:  Recent Labs Lab 12/09/16 1750  WBC 10.2  HGB 10.1*  HCT 36.2  MCV 82.6  PLT 220   Basic Metabolic Panel:  Recent Labs Lab 12/09/16 1750 12/10/16 0240 12/11/16 0326 12/12/16 0428  NA 140 139 139 137  K 4.3 3.8 3.9 4.4  CL 99* 97* 93* 92*  CO2 30 31 33* 35*  GLUCOSE 129* 188* 212* 232*  BUN 10 14 22* 38*  CREATININE 0.65 0.63 0.82 0.98  CALCIUM 9.2 9.3 9.4 9.6   GFR: Estimated Creatinine Clearance: 69.2 mL/min (by C-G formula based on SCr of 0.98 mg/dL). Liver Function Tests: No results for input(s): AST, ALT, ALKPHOS, BILITOT, PROT, ALBUMIN in the last 168 hours. No results for input(s): LIPASE, AMYLASE in the last 168 hours. No results for input(s): AMMONIA in the last 168 hours. Coagulation Profile:  Recent Labs Lab 12/10/16 0240 12/11/16 0326 12/12/16 0428 12/13/16 0342 12/14/16 0252  INR 1.68 2.45 2.55 2.13 1.98   Cardiac Enzymes: No results for input(s): CKTOTAL, CKMB, CKMBINDEX, TROPONINI in the last 168 hours. BNP (last 3 results) No results for input(s): PROBNP in the last 8760 hours. HbA1C: No results for input(s): HGBA1C in the last 72 hours. CBG:  Recent Labs Lab 12/13/16 1138 12/13/16 1550 12/13/16 2123 12/14/16 0304 12/14/16 0730  GLUCAP 234* 173* 495* 158* 128*   Lipid Profile: No results for input(s): CHOL, HDL, LDLCALC, TRIG, CHOLHDL, LDLDIRECT in the last 72 hours. Thyroid Function Tests: No results for input(s): TSH, T4TOTAL, FREET4, T3FREE, THYROIDAB in the last 72 hours. Anemia Panel: No results for input(s): VITAMINB12, FOLATE, FERRITIN, TIBC, IRON, RETICCTPCT in the last 72 hours. Sepsis Labs: No results for input(s): PROCALCITON, LATICACIDVEN in the last 168 hours.  Recent Results (from the past 240 hour(s))  MRSA PCR Screening      Status: None   Collection Time: 12/09/16 11:37 PM  Result Value Ref Range Status   MRSA by PCR NEGATIVE NEGATIVE Final    Comment:        The GeneXpert MRSA Assay (FDA approved for NASAL specimens only), is one component of a comprehensive MRSA colonization surveillance program. It is not intended to diagnose MRSA infection nor to guide or monitor treatment for MRSA infections.          Radiology Studies: No results found.      Scheduled Meds: . acetaZOLAMIDE  250 mg Oral BID  . aspirin  81 mg Oral Daily  . azithromycin  500 mg Oral Q24H  . bisoprolol  10 mg Oral Daily  . diltiazem  300 mg Oral Daily  . DULoxetine  60 mg Oral Daily  . famotidine  20 mg Oral BID  . ferrous sulfate  325 mg Oral BID WC  . furosemide  40 mg Oral BID  . guaiFENesin  600 mg Oral BID  . insulin aspart  0-15  Units Subcutaneous TID WC  . insulin aspart  0-5 Units Subcutaneous QHS  . ipratropium-albuterol  3 mL Nebulization TID  . levothyroxine  175 mcg Oral QAC breakfast  . losartan  50 mg Oral Daily  . mometasone-formoterol  2 puff Inhalation BID  . pravastatin  40 mg Oral q1800  . predniSONE  50 mg Oral Q breakfast  . sodium chloride flush  3 mL Intravenous Q12H  . warfarin  5 mg Oral ONCE-1800  . Warfarin - Pharmacist Dosing Inpatient   Does not apply q1800   Continuous Infusions:    LOS: 5 days    Eben Choinski Tanna Furry, MD [facility] Pager [phone]  If 7PM-7AM, please contact night-coverage [email or web address] Password [email or web address] 12/14/2016, 10:40 AM

## 2016-12-14 NOTE — Progress Notes (Signed)
Progress Note  Patient Name: Jillian Wood Date of Encounter: 12/14/2016  Primary Cardiologist: Ellyn Hack  Subjective   No chest pain mild dyspnea did not use CPAP this am   Inpatient Medications    Scheduled Meds: . acetaZOLAMIDE  250 mg Oral BID  . aspirin  81 mg Oral Daily  . azithromycin  500 mg Intravenous Q24H  . bisoprolol  10 mg Oral Daily  . diltiazem  300 mg Oral Daily  . DULoxetine  60 mg Oral Daily  . famotidine  20 mg Oral BID  . ferrous sulfate  325 mg Oral BID WC  . furosemide  40 mg Oral BID  . guaiFENesin  600 mg Oral BID  . insulin aspart  0-15 Units Subcutaneous TID WC  . insulin aspart  0-5 Units Subcutaneous QHS  . ipratropium-albuterol  3 mL Nebulization TID  . levothyroxine  175 mcg Oral QAC breakfast  . losartan  50 mg Oral Daily  . mometasone-formoterol  2 puff Inhalation BID  . pravastatin  40 mg Oral q1800  . predniSONE  50 mg Oral Q breakfast  . sodium chloride flush  3 mL Intravenous Q12H  . Warfarin - Pharmacist Dosing Inpatient   Does not apply q1800   Continuous Infusions:  PRN Meds: sodium chloride, acetaminophen, acetaminophen-codeine, fluticasone, ipratropium-albuterol, ondansetron (ZOFRAN) IV, sodium chloride flush   Vital Signs    Vitals:   12/13/16 2042 12/14/16 0054 12/14/16 0312 12/14/16 0719  BP:  109/89 (!) 116/92   Pulse: (!) 111 (!) 105 100   Resp: 16 19 20    Temp:  98.2 F (36.8 C) 98.3 F (36.8 C)   TempSrc:      SpO2: 96% 97% 92% 93%  Weight:   256 lb 9.6 oz (116.4 kg)   Height:        Intake/Output Summary (Last 24 hours) at 12/14/16 0758 Last data filed at 12/14/16 0314  Gross per 24 hour  Intake              240 ml  Output             1700 ml  Net            -1460 ml   Filed Weights   12/12/16 0516 12/13/16 0555 12/14/16 0312  Weight: 255 lb 1.6 oz (115.7 kg) 256 lb 2.8 oz (116.2 kg) 256 lb 9.6 oz (116.4 kg)    Telemetry    Afib/flutter rate 90-100- Personally Reviewed  ECG    Aflutter RBBB  rate 106 - Personally Reviewed 12/10/16   Physical Exam   GEN: Obese pale white female .   Neck: No JVD Cardiac: RRR, no murmurs, rubs, or gallops.  Respiratory: Clear to auscultation bilaterally. GI: Soft, nontender, non-distended  MS: No edema; No deformity. Neuro:  Nonfocal  Psych: Normal affect   Labs    Chemistry Recent Labs Lab 12/10/16 0240 12/11/16 0326 12/12/16 0428  NA 139 139 137  K 3.8 3.9 4.4  CL 97* 93* 92*  CO2 31 33* 35*  GLUCOSE 188* 212* 232*  BUN 14 22* 38*  CREATININE 0.63 0.82 0.98  CALCIUM 9.3 9.4 9.6  GFRNONAA >60 >60 57*  GFRAA >60 >60 >60  ANIONGAP 11 13 10      Hematology Recent Labs Lab 12/09/16 1750  WBC 10.2  RBC 4.38  HGB 10.1*  HCT 36.2  MCV 82.6  MCH 23.1*  MCHC 27.9*  RDW 18.5*  PLT 241    Cardiac  EnzymesNo results for input(s): TROPONINI in the last 168 hours.  Recent Labs Lab 12/09/16 1806  TROPIPOC 0.00     BNP Recent Labs Lab 12/09/16 1750  BNP 472.3*     DDimer No results for input(s): DDIMER in the last 168 hours.   Radiology    No results found.  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 Pt was on a High Flow   Nasal Cannula on 10L O2 sitting in high fowlers, technically   difficult study. - Left ventricle: Systolic function was vigorous. The estimated   ejection fraction was in the range of 65% to 70%. The study is   not technically sufficient to allow evaluation of LV diastolic   function. - Aortic valve: Mildly calcified leaflets. There was no stenosis. - Mitral valve: Calcified annulus. 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 TEE is recommended for better   visualization. - Tricuspid valve: There was moderate regurgitation. - Pulmonary arteries: PA peak pressure: 61 mm Hg (S). - Inferior vena cava: The vessel was dilated. The  respirophasic   diameter changes were blunted (< 50%), consistent with elevated   central venous pressure.  Impressions:  - Technically difficult study. Definity contrast given. LVEF   65-70%, calcified aortic and mitral annulus with a calcified   mitral leaflets and a mobile mass noted on the ventricular side   of the valve - this could be vegetation or thrombus. TEE is   recommended for further assessment. There is moderate TR, RVSP 61   mmHg, dilated IVC, no pericardial effusion.   Patient Profile     71 y.o. female  Admitted with COPD exacerbation TTE with ? Mass on venticular Side of AV waiting for TEE on Monday   Assessment & Plan    1) Diastolic CHF:  Improved continue current diuretic more COPD exacerbation 2) Flutter seems like she is rate control and anticoagulation cannot afford NOAC No plans for [facility] with TEE on Monday pharmacy following INR 3) COPD improving plan per primary service can arrange TEE as outpatient if patient Ready for d/c over weekend  4) Abnormal echo ? Mass on ventricular surface AV not clinically relevant at this point  TEE planned for Monday if still in hospital  Signed, Jenkins Rouge, MD  12/14/2016, 7:58 AM

## 2016-12-14 NOTE — Progress Notes (Signed)
Pt states she has not had BM since 12/06/16. Refuses laxatives or other at this time. Prune juice cup provided at this time per pt request.

## 2016-12-15 ENCOUNTER — Other Ambulatory Visit: Payer: Self-pay | Admitting: Internal Medicine

## 2016-12-15 LAB — GLUCOSE, CAPILLARY
GLUCOSE-CAPILLARY: 169 mg/dL — AB (ref 65–99)
Glucose-Capillary: 180 mg/dL — ABNORMAL HIGH (ref 65–99)
Glucose-Capillary: 288 mg/dL — ABNORMAL HIGH (ref 65–99)
Glucose-Capillary: 287 mg/dL — ABNORMAL HIGH (ref 65–99)

## 2016-12-15 LAB — PROTIME-INR
INR: 2.27
PROTHROMBIN TIME: 25.4 s — AB (ref 11.4–15.2)

## 2016-12-15 MED ORDER — WARFARIN SODIUM 5 MG PO TABS
5.0000 mg | ORAL_TABLET | Freq: Once | ORAL | Status: AC
  Administered 2016-12-15: 5 mg via ORAL
  Filled 2016-12-15: qty 1

## 2016-12-15 MED ORDER — PREDNISONE 20 MG PO TABS
30.0000 mg | ORAL_TABLET | Freq: Every day | ORAL | Status: DC
  Filled 2016-12-15: qty 1

## 2016-12-15 MED ORDER — SODIUM CHLORIDE 0.9 % IV SOLN
INTRAVENOUS | Status: DC
  Administered 2016-12-15: 20 mL/h via INTRAVENOUS

## 2016-12-15 NOTE — Progress Notes (Signed)
PROGRESS NOTE    Jillian Wood  PPI:[id number] DOB: 11-01-1945 DOA: 12/09/2016 PCP: Alesia Richards, MD   Brief Narrative: 71 year old female with history of COPD with chronic hypoxic respiratory failure (on 5 L via nasal cannula), chronic diastolic CHF, hypertension, hypothyroidism, GERD was sent to the ED by her PCP for shortness of breath and increased oxygen requirement Patient placed on BiPAP on admission. Also went into rapid A. fib. Admitted to stepdown unit on BiPAP and Cardizem drip. Assessment & Plan:  # Acute on chronic respiratory failure with hypoxia: Due to combination of COPD exacerbation and acute on chronic diastolic congestive heart failure. Patient is requiring about 7 L of oxygen, unable to wean down to  home dose of 5 L. Denied shortness of breath or chest pain today.  #COPD with acute exacerbation: On BiPAP. Continue bronchodilators, azithromycin, Mucinex. taper prednisone to . Clinically stable.  #Acute on chronic diastolic congestive heart failure: volume status improved, on oral lasix and home dose of acetazolamide.  -Echocardiogram showed EF of 65-70%. Echocardiogram also showed a mobile mass noted on the ventricular side of the mitral valve this could be vegetation or thrombus. Cardiology consult obtained. Plan for TEE tomorrow. No change in plan today.   #Atrial flutter with rapid ventricular response: Off Cardizem drip and now on oral Cardizem. Continue bisoprolol, Coumadin. Monitor INR. Monitor heart rate.  #Type 2 diabetes: On metformin at home. Last A1c 5.2. Continue sliding scale. Monitor blood sugar level.  #Essential hypertension: Blood pressure acceptable. Continue to monitor.   #Hypothyroidism: Continue Synthroid.  Principal Problem:   COPD with acute exacerbation ([place]) Active Problems:   Essential hypertension   Hypothyroidism   Acute on chronic diastolic CHF (congestive heart failure) (HCC)   GERD (gastroesophageal reflux disease)  Acute on chronic respiratory failure with hypoxia (H[facility])   Atrial flutter with rapid ventricular response ([place])   Diabetes mellitus type 2 in obese (HCC)  DVT prophylaxis: on Coumadin  Code Status: full code  Family Communication: no family present at bedside. Disposition Plan:likely discharge home with home care. PT OT evaluation     Consultants:   cardiology  Procedures: BiPAP  Antimicrobials:Azithromycin   Subjective: Patient was seen and examined at bedside. Denied chest pain, shortness of breath. Waiting for TEE tomorrow. No new event. Objective: Vitals:   12/15/16 0449 12/15/16 0733 12/15/16 0748 12/15/16 1102  BP: (!) 109/55 94/78  (!) 96/49  Pulse: 91 89  93  Resp: 17 16  16   Temp: 97.6 F (36.4 C) 98.5 F (36.9 C)  98.7 F (37.1 C)  TempSrc:  Oral  Oral  SpO2: 100% 98% 100% 96%  Weight: 111.1 kg (245 lb)     Height:        Intake/Output Summary (Last 24 hours) at 12/15/16 1156 Last data filed at 12/15/16 0943  Gross per 24 hour  Intake              963 ml  Output             3575 ml  Net            -2612 ml   Filed Weights   12/13/16 0555 12/14/16 0312 12/15/16 0449  Weight: 116.2 kg (256 lb 2.8 oz) 116.4 kg (256 lb 9.6 oz) 111.1 kg (245 lb)    Examination:  General exam: Not in distress Respiratory: Clear bilateral, no wheezing or crackles. Respiratory effort normal.  Cardiovascular system: Regular rate rhythm, S1 is normal.  Gastrointestinal system: Abdomen  is nondistended, soft and nontender. Normal bowel sounds heard. Central nervous system: Alert and oriented. No focal neurological deficits. Extremities: Symmetric 5 x 5 power. Lower extremities edema improved Skin: No rashes, lesions or ulcers Psychiatry: Judgement and insight appear normal. Mood & affect appropriate.     Data Reviewed: I have personally reviewed following labs and imaging studies  CBC:  Recent Labs Lab 12/09/16 1750  WBC 10.2  HGB 10.1*  HCT 36.2  MCV 82.6  PLT 379    Basic Metabolic Panel:  Recent Labs Lab 12/09/16 1750 12/10/16 0240 12/11/16 0326 12/12/16 0428  NA 140 139 139 137  K 4.3 3.8 3.9 4.4  CL 99* 97* 93* 92*  CO2 30 31 33* 35*  GLUCOSE 129* 188* 212* 232*  BUN 10 14 22* 38*  CREATININE 0.65 0.63 0.82 0.98  CALCIUM 9.2 9.3 9.4 9.6   GFR: Estimated Creatinine Clearance: 67.5 mL/min (by C-G formula based on SCr of 0.98 mg/dL). Liver Function Tests: No results for input(s): AST, ALT, ALKPHOS, BILITOT, PROT, ALBUMIN in the last 168 hours. No results for input(s): LIPASE, AMYLASE in the last 168 hours. No results for input(s): AMMONIA in the last 168 hours. Coagulation Profile:  Recent Labs Lab 12/11/16 0326 12/12/16 0428 12/13/16 0342 12/14/16 0252 12/15/16 0432  INR 2.45 2.55 2.13 1.98 2.27   Cardiac Enzymes: No results for input(s): CKTOTAL, CKMB, CKMBINDEX, TROPONINI in the last 168 hours. BNP (last 3 results) No results for input(s): PROBNP in the last 8760 hours. HbA1C: No results for input(s): HGBA1C in the last 72 hours. CBG:  Recent Labs Lab 12/14/16 1124 12/14/16 1610 12/14/16 2112 12/15/16 0733 12/15/16 1101  GLUCAP 226* 259* 269* 169* 180*   Lipid Profile: No results for input(s): CHOL, HDL, LDLCALC, TRIG, CHOLHDL, LDLDIRECT in the last 72 hours. Thyroid Function Tests: No results for input(s): TSH, T4TOTAL, FREET4, T3FREE, THYROIDAB in the last 72 hours. Anemia Panel: No results for input(s): VITAMINB12, FOLATE, FERRITIN, TIBC, IRON, RETICCTPCT in the last 72 hours. Sepsis Labs: No results for input(s): PROCALCITON, LATICACIDVEN in the last 168 hours.  Recent Results (from the past 240 hour(s))  MRSA PCR Screening     Status: None   Collection Time: 12/09/16 11:37 PM  Result Value Ref Range Status   MRSA by PCR NEGATIVE NEGATIVE Final    Comment:        The GeneXpert MRSA Assay (FDA approved for NASAL specimens only), is one component of a comprehensive MRSA colonization surveillance  program. It is not intended to diagnose MRSA infection nor to guide or monitor treatment for MRSA infections.          Radiology Studies: No results found.      Scheduled Meds: . acetaZOLAMIDE  250 mg Oral BID  . aspirin  81 mg Oral Daily  . azithromycin  500 mg Oral Q24H  . bisoprolol  10 mg Oral Daily  . diltiazem  300 mg Oral Daily  . DULoxetine  60 mg Oral Daily  . famotidine  20 mg Oral BID  . ferrous sulfate  325 mg Oral BID WC  . furosemide  40 mg Oral BID  . guaiFENesin  600 mg Oral BID  . insulin aspart  0-15 Units Subcutaneous TID WC  . insulin aspart  0-5 Units Subcutaneous QHS  . ipratropium-albuterol  3 mL Nebulization TID  . levothyroxine  175 mcg Oral QAC breakfast  . losartan  50 mg Oral Daily  . mometasone-formoterol  2 puff Inhalation BID  .  pravastatin  40 mg Oral q1800  . predniSONE  50 mg Oral Q breakfast  . sodium chloride flush  3 mL Intravenous Q12H  . warfarin  5 mg Oral ONCE-1800  . Warfarin - Pharmacist Dosing Inpatient   Does not apply q1800   Continuous Infusions:    LOS: 6 days    Dron Tanna Furry, MD [facility] Pager [phone]  If 7PM-7AM, please contact night-coverage [email or web address] Password [email or web address] 12/15/2016, 11:56 AM

## 2016-12-15 NOTE — Progress Notes (Signed)
ANTICOAGULATION CONSULT NOTE - Follow Up Consult  Pharmacy Consult for Coumadin Indication: Afib  Allergies  Allergen Reactions  . Inderal [Propranolol] Other (See Comments)    Hair loss  . Ace Inhibitors Cough    Patient Measurements: Height: 5\' 6"  (167.6 cm) Weight: 245 lb (111.1 kg) IBW/kg (Calculated) : 59.3  Vital Signs: Temp: 98.5 F (36.9 C) (04/01 0733) Temp Source: Oral (04/01 0733) BP: 94/78 (04/01 0733) Pulse Rate: 89 (04/01 0733)  Labs:  Recent Labs  12/13/16 0342 12/14/16 0252 12/15/16 0432  LABPROT 24.2* 22.8* 25.4*  INR 2.13 1.98 2.27    Estimated Creatinine Clearance: 67.5 mL/min (by C-G formula based on SCr of 0.98 mg/dL).   Assessment: 34 yoF on warfarin PTA for atrial fibrillation to resume per pharmacy. INR has been labile this admit but currently therapeutic at 2.27 after being subtherapeutic yesterday secondary to held doses 3/29 and 3/30.   PTA Warfarin Dose = 7.5mg  Tues/Thurs, 5mg  all other days  Goal of Therapy:  INR 2-3 Monitor platelets by anticoagulation protocol: Yes   Plan:  -Coumadin 5mg  po x1 tonight. -Daily INR, S/Sx bleeding  Arrie Senate, PharmD PGY-1 Pharmacy Resident Pager: [phone] 12/15/2016

## 2016-12-15 NOTE — Progress Notes (Signed)
Progress Note  Patient Name: Jillian Wood Date of Encounter: 12/15/2016  Primary Cardiologist: Ellyn Hack  Subjective   No chest pain sitting up in bed with no dyspnea Explained TEE to her   Inpatient Medications    Scheduled Meds: . acetaZOLAMIDE  250 mg Oral BID  . aspirin  81 mg Oral Daily  . azithromycin  500 mg Oral Q24H  . bisoprolol  10 mg Oral Daily  . diltiazem  300 mg Oral Daily  . DULoxetine  60 mg Oral Daily  . famotidine  20 mg Oral BID  . ferrous sulfate  325 mg Oral BID WC  . furosemide  40 mg Oral BID  . guaiFENesin  600 mg Oral BID  . insulin aspart  0-15 Units Subcutaneous TID WC  . insulin aspart  0-5 Units Subcutaneous QHS  . ipratropium-albuterol  3 mL Nebulization TID  . levothyroxine  175 mcg Oral QAC breakfast  . losartan  50 mg Oral Daily  . mometasone-formoterol  2 puff Inhalation BID  . pravastatin  40 mg Oral q1800  . predniSONE  50 mg Oral Q breakfast  . sodium chloride flush  3 mL Intravenous Q12H  . warfarin  5 mg Oral ONCE-1800  . Warfarin - Pharmacist Dosing Inpatient   Does not apply q1800   Continuous Infusions:  PRN Meds: sodium chloride, acetaminophen, acetaminophen-codeine, fluticasone, ipratropium-albuterol, ondansetron (ZOFRAN) IV, sodium chloride flush   Vital Signs    Vitals:   12/14/16 2344 12/15/16 0449 12/15/16 0733 12/15/16 0748  BP: (!) 102/54 (!) 109/55 94/78   Pulse: 99 91 89   Resp: 18 17 16    Temp: 97.5 F (36.4 C) 97.6 F (36.4 C) 98.5 F (36.9 C)   TempSrc:   Oral   SpO2: 96% 100% 98% 100%  Weight:  245 lb (111.1 kg)    Height:        Intake/Output Summary (Last 24 hours) at 12/15/16 0855 Last data filed at 12/15/16 0454  Gross per 24 hour  Intake             1083 ml  Output             3200 ml  Net            -2117 ml   Filed Weights   12/13/16 0555 12/14/16 0312 12/15/16 0449  Weight: 256 lb 2.8 oz (116.2 kg) 256 lb 9.6 oz (116.4 kg) 245 lb (111.1 kg)    Telemetry    Afib/flutter rate  90-100- Personally Reviewed  ECG    Aflutter RBBB rate 106 - Personally Reviewed 12/10/16   Physical Exam   GEN: Obese pale white female .   Neck: No JVD Cardiac: RRR, no murmurs, rubs, or gallops.  Respiratory: Clear to auscultation bilaterally. GI: Soft, nontender, non-distended  MS: No edema; No deformity. Neuro:  Nonfocal  Psych: Normal affect   Labs    Chemistry  Recent Labs Lab 12/10/16 0240 12/11/16 0326 12/12/16 0428  NA 139 139 137  K 3.8 3.9 4.4  CL 97* 93* 92*  CO2 31 33* 35*  GLUCOSE 188* 212* 232*  BUN 14 22* 38*  CREATININE 0.63 0.82 0.98  CALCIUM 9.3 9.4 9.6  GFRNONAA >60 >60 57*  GFRAA >60 >60 >60  ANIONGAP 11 13 10      Hematology  Recent Labs Lab 12/09/16 1750  WBC 10.2  RBC 4.38  HGB 10.1*  HCT 36.2  MCV 82.6  MCH 23.1*  MCHC 27.9*  RDW 18.5*  PLT 241    Cardiac EnzymesNo results for input(s): TROPONINI in the last 168 hours.   Recent Labs Lab 12/09/16 1806  TROPIPOC 0.00     BNP  Recent Labs Lab 12/09/16 1750  BNP 472.3*     DDimer No results for input(s): DDIMER in the last 168 hours.   Radiology    No results found.  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 Pt was on a High Flow   Nasal Cannula on 10L O2 sitting in high fowlers, technically   difficult study. - Left ventricle: Systolic function was vigorous. The estimated   ejection fraction was in the range of 65% to 70%. The study is   not technically sufficient to allow evaluation of LV diastolic   function. - Aortic valve: Mildly calcified leaflets. There was no stenosis. - Mitral valve: Calcified annulus. 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 TEE is recommended for better   visualization. - Tricuspid valve: There was moderate regurgitation. - Pulmonary arteries: PA peak pressure: 61 mm Hg  (S). - Inferior vena cava: The vessel was dilated. The respirophasic   diameter changes were blunted (< 50%), consistent with elevated   central venous pressure.  Impressions:  - Technically difficult study. Definity contrast given. LVEF   65-70%, calcified aortic and mitral annulus with a calcified   mitral leaflets and a mobile mass noted on the ventricular side   of the valve - this could be vegetation or thrombus. TEE is   recommended for further assessment. There is moderate TR, RVSP 61   mmHg, dilated IVC, no pericardial effusion.   Patient Profile     71 y.o. female  Admitted with COPD exacerbation TTE with ? Mass on venticular Side of AV waiting for TEE on Monday   Assessment & Plan    1) Diastolic CHF:  Improved continue current diuretic more COPD exacerbation 2) Flutter seems like she is rate control and anticoagulation cannot afford NOAC No plans for [facility] with TEE on Monday pharmacy following INR Rx now  3) COPD improving plan per primary service can arrange TEE as outpatient if patient Ready for d/c over weekend  4) Abnormal echo ? Mass on ventricular surface AV not clinically relevant at this point  TEE planned for Monday have written orders for Dr Loletha Grayer  Signed, Jenkins Rouge, MD  12/15/2016, 8:55 AM

## 2016-12-16 ENCOUNTER — Encounter (HOSPITAL_COMMUNITY): Payer: Self-pay | Admitting: Certified Registered Nurse Anesthetist

## 2016-12-16 ENCOUNTER — Encounter (HOSPITAL_COMMUNITY)
Admission: EM | Disposition: A | Discharge status: Home Health | Payer: Self-pay | Source: Home / Self Care | Attending: Nephrology

## 2016-12-16 ENCOUNTER — Inpatient Hospital Stay (HOSPITAL_COMMUNITY): Payer: PPO

## 2016-12-16 ENCOUNTER — Inpatient Hospital Stay (HOSPITAL_COMMUNITY): Payer: PPO | Admitting: Certified Registered Nurse Anesthetist

## 2016-12-16 DIAGNOSIS — I340 Nonrheumatic mitral (valve) insufficiency: Secondary | ICD-10-CM

## 2016-12-16 DIAGNOSIS — Q248 Other specified congenital malformations of heart: Secondary | ICD-10-CM

## 2016-12-16 DIAGNOSIS — Q2388 Other congenital malformations of aortic and mitral valves: Secondary | ICD-10-CM

## 2016-12-16 DIAGNOSIS — Q238 Other congenital malformations of aortic and mitral valves: Secondary | ICD-10-CM

## 2016-12-16 HISTORY — PX: TEE WITHOUT CARDIOVERSION: SHX5443

## 2016-12-16 LAB — PROTIME-INR
INR: 2.46
PROTHROMBIN TIME: 27.2 s — AB (ref 11.4–15.2)

## 2016-12-16 LAB — GLUCOSE, CAPILLARY
GLUCOSE-CAPILLARY: 153 mg/dL — AB (ref 65–99)
GLUCOSE-CAPILLARY: 358 mg/dL — AB (ref 65–99)

## 2016-12-16 SURGERY — ECHOCARDIOGRAM, TRANSESOPHAGEAL
Anesthesia: Monitor Anesthesia Care

## 2016-12-16 MED ORDER — PREDNISONE 10 MG PO TABS: 10.0000 mg | ORAL_TABLET | Freq: Every day | ORAL | 0 refills | Status: DC

## 2016-12-16 MED ORDER — PROPOFOL 10 MG/ML IV BOLUS
INTRAVENOUS | Status: AC
  Filled 2016-12-16: qty 20

## 2016-12-16 MED ORDER — FENTANYL CITRATE (PF) 100 MCG/2ML IJ SOLN: 25.0000 ug | INTRAMUSCULAR | Status: DC | PRN

## 2016-12-16 MED ORDER — LIDOCAINE HCL (CARDIAC) 20 MG/ML IV SOLN
INTRAVENOUS | Status: DC | PRN
  Administered 2016-12-16: 100 mg via INTRAVENOUS

## 2016-12-16 MED ORDER — PROPOFOL 10 MG/ML IV BOLUS
INTRAVENOUS | Status: DC | PRN
  Administered 2016-12-16: 20 mg via INTRAVENOUS

## 2016-12-16 MED ORDER — BUTAMBEN-TETRACAINE-BENZOCAINE 2-2-14 % EX AERO
INHALATION_SPRAY | CUTANEOUS | Status: DC | PRN
  Administered 2016-12-16: 1 via TOPICAL

## 2016-12-16 MED ORDER — OXYCODONE HCL 5 MG PO TABS: 5.0000 mg | ORAL_TABLET | Freq: Once | ORAL | Status: DC | PRN

## 2016-12-16 MED ORDER — PHENYLEPHRINE HCL 10 MG/ML IJ SOLN
INTRAMUSCULAR | Status: DC | PRN
  Administered 2016-12-16: 80 ug via INTRAVENOUS

## 2016-12-16 MED ORDER — ONDANSETRON HCL 4 MG/2ML IJ SOLN: 4.0000 mg | Freq: Once | INTRAMUSCULAR | Status: DC | PRN

## 2016-12-16 MED ORDER — GLYCOPYRROLATE 0.2 MG/ML IJ SOLN
INTRAMUSCULAR | Status: DC | PRN
  Administered 2016-12-16: 0.2 mg via INTRAVENOUS

## 2016-12-16 MED ORDER — ACETAZOLAMIDE 250 MG PO TABS: 250.0000 mg | ORAL_TABLET | Freq: Two times a day (BID) | ORAL | 0 refills | Status: DC

## 2016-12-16 MED ORDER — FENTANYL CITRATE (PF) 250 MCG/5ML IJ SOLN
INTRAMUSCULAR | Status: AC
  Filled 2016-12-16: qty 5

## 2016-12-16 MED ORDER — LACTATED RINGERS IV SOLN
INTRAVENOUS | Status: DC | PRN
Start: 2016-12-16 — End: 2016-12-16
  Administered 2016-12-16: 08:00:00 via INTRAVENOUS

## 2016-12-16 MED ORDER — MIDAZOLAM HCL 2 MG/2ML IJ SOLN
INTRAMUSCULAR | Status: AC
  Filled 2016-12-16: qty 2

## 2016-12-16 MED ORDER — PROPOFOL 500 MG/50ML IV EMUL
INTRAVENOUS | Status: DC | PRN
  Administered 2016-12-16: 75 ug/kg/min via INTRAVENOUS

## 2016-12-16 MED ORDER — OXYCODONE HCL 5 MG/5ML PO SOLN: 5.0000 mg | Freq: Once | ORAL | Status: DC | PRN

## 2016-12-16 NOTE — Progress Notes (Signed)
Progress Note  Patient Name: Jillian Wood Date of Encounter: 12/16/2016  Primary Cardiologist: Gwenlyn Found  Subjective   Post TEE. No CP, no SOB  Inpatient Medications    Scheduled Meds: . acetaZOLAMIDE  250 mg Oral BID  . aspirin  81 mg Oral Daily  . azithromycin  500 mg Oral Q24H  . bisoprolol  10 mg Oral Daily  . diltiazem  300 mg Oral Daily  . DULoxetine  60 mg Oral Daily  . famotidine  20 mg Oral BID  . ferrous sulfate  325 mg Oral BID WC  . furosemide  40 mg Oral BID  . guaiFENesin  600 mg Oral BID  . insulin aspart  0-15 Units Subcutaneous TID WC  . insulin aspart  0-5 Units Subcutaneous QHS  . ipratropium-albuterol  3 mL Nebulization TID  . levothyroxine  175 mcg Oral QAC breakfast  . losartan  50 mg Oral Daily  . mometasone-formoterol  2 puff Inhalation BID  . pravastatin  40 mg Oral q1800  . predniSONE  30 mg Oral Q breakfast  . sodium chloride flush  3 mL Intravenous Q12H  . Warfarin - Pharmacist Dosing Inpatient   Does not apply q1800   Continuous Infusions:  PRN Meds: sodium chloride, acetaminophen, acetaminophen-codeine, fluticasone, ipratropium-albuterol, ondansetron (ZOFRAN) IV, sodium chloride flush   Vital Signs    Vitals:   12/16/16 0840 12/16/16 0845 12/16/16 0850 12/16/16 0910  BP: 130/79 116/63 116/83 (!) 113/92  Pulse: (!) 130 (!) 130 (!) 129 (!) 131  Resp: 18 (!) 22 20 18   Temp: 98 F (36.7 C)   98.3 F (36.8 C)  TempSrc: Oral   Oral  SpO2: (!) 89% 94% 97% 94%  Weight:      Height:        Intake/Output Summary (Last 24 hours) at 12/16/16 0926 Last data filed at 12/16/16 9678  Gross per 24 hour  Intake           939.33 ml  Output             3525 ml  Net         -2585.67 ml   Filed Weights   12/14/16 0312 12/15/16 0449 12/16/16 0430  Weight: 256 lb 9.6 oz (116.4 kg) 245 lb (111.1 kg) 250 lb 10.6 oz (113.7 kg)    Telemetry    AFlutter rate now 130 but has been in the 80's- Personally Reviewed  ECG    Aflutter - Personally  Reviewed  Physical Exam   GEN: No acute distress.   Neck: No JVD Cardiac: Tachy reg, no murmurs, rubs, or gallops.  Respiratory: Mild wheeze B. Decreased air movement (COPD) GI: Soft, nontender, non-distended  MS: No edema; No deformity. Ruddy appearance to feet bilaterally Neuro:  Nonfocal  Psych: Normal affect   Labs    Chemistry Recent Labs Lab 12/10/16 0240 12/11/16 0326 12/12/16 0428  NA 139 139 137  K 3.8 3.9 4.4  CL 97* 93* 92*  CO2 31 33* 35*  GLUCOSE 188* 212* 232*  BUN 14 22* 38*  CREATININE 0.63 0.82 0.98  CALCIUM 9.3 9.4 9.6  GFRNONAA >60 >60 57*  GFRAA >60 >60 >60  ANIONGAP 11 13 10      Hematology Recent Labs Lab 12/09/16 1750  WBC 10.2  RBC 4.38  HGB 10.1*  HCT 36.2  MCV 82.6  MCH 23.1*  MCHC 27.9*  RDW 18.5*  PLT 241    Cardiac EnzymesNo results for input(s): TROPONINI in  the last 168 hours.  Recent Labs Lab 12/09/16 1806  TROPIPOC 0.00     BNP Recent Labs Lab 12/09/16 1750  BNP 472.3*     DDimer No results for input(s): DDIMER in the last 168 hours.   Radiology    No results found.  Cardiac Studies   TEE Broad based, slightly mobile calcified mass is attached to th ventricular aspect of the mitrtal annulus. It has an atypical appearance for vegetation. Degenerative change (caseous necrosis is favored, but cannot exclude a papillary fibroelastoma or atypical myxoma. Moderate TR, Dilated right heart chambers c/w cor pulmonale. Normal LVEF. Severe aortic atherosclerosis.  Patient Profile     71 y.o. female admitted with COPD exacerbation noted to have abnormal echo as above. Atrial flutter as well.  Assessment & Plan    Mass on ECHO  - atypical for vegetation. Likely degenerative change. Will monitor clinically.  Acute on chronic diastolic HF  - improved with IV lasix and with therapy of COPD. Normal EF.   Aflutter  - rate control on cardizem and bisoprolol. Was rate controlled in the 80's. Now in the 130  transiently. Continue current medication dose.   Chronic anticoagution  - does not think she can afford NOAC. Coumadin  Diabetes with hypertension  - stable, per primary team.   - evidence of PVD in feet B.   OK with DC from cardiac perspective  Will sign off. Please call with ?   Signed, Candee Furbish, MD  12/16/2016, 9:26 AM

## 2016-12-16 NOTE — Progress Notes (Signed)
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 Will follow up as time allows.   Marguarite Arbour A Dwayne Begay 12/16/2016, 8:45 AM Wray Kearns, [place], DPT [phone]

## 2016-12-16 NOTE — Anesthesia Preprocedure Evaluation (Addendum)
Anesthesia Evaluation  Patient identified by MRN, date of birth, ID band Patient awake    Reviewed: Allergy & Precautions, NPO status , Patient's Chart, lab work & pertinent test results  Airway Mallampati: II       Dental  (+) Edentulous Upper, Edentulous Lower   Pulmonary former smoker,    Pulmonary exam normal breath sounds clear to auscultation       Cardiovascular hypertension, Normal cardiovascular exam Rhythm:Regular Rate:Normal     Neuro/Psych    GI/Hepatic   Endo/Other  diabetes, Type 2, Oral Hypoglycemic AgentsMorbid obesity  Renal/GU      Musculoskeletal   Abdominal (+) + obese,   Peds  Hematology   Anesthesia Other Findings Study Result   Result status: Final result                             *[facility]*                   *[facility]*                         [address], [place] [address]                            [phone]  ------------------------------------------------------------------- Transthoracic Echocardiography  Patient:    Jillian Wood, Jillian Wood MR #:       [id number] Study Date: 12/11/2016 Gender:     F Age:        71 Height:     167.6 cm Weight:     114.8 kg BSA:        2.37 m^2 Pt. Status: Room:       3W08C   ATTENDING    Carmin Muskrat [id number]  PERFORMING   [facility], Inpatient  SONOGRAPHER  Madelin Rear, RDCS  ADMITTING    Opyd, Ilene Qua  ORDERING     Opyd, Timothy S  REFERRING    Opyd, Timothy S  cc:  ------------------------------------------------------------------- LV EF: 65% -   70%     Reproductive/Obstetrics                            Anesthesia Physical Anesthesia Plan  ASA: III  Anesthesia Plan: MAC   Post-op Pain Management:    Induction:   Airway Management Planned: Natural Airway and Simple Face Mask  Additional Equipment:   Intra-op Plan:   Post-operative  Plan:   Informed Consent: I have reviewed the patients History and Physical, chart, labs and discussed the procedure including the risks, benefits and alternatives for the proposed anesthesia with the patient or authorized representative who has indicated his/her understanding and acceptance.     Plan Discussed with: CRNA and Surgeon  Anesthesia Plan Comments:         Anesthesia Quick Evaluation

## 2016-12-16 NOTE — Progress Notes (Signed)
  Echocardiogram Echocardiogram Transesophageal has been performed.  Jennette Dubin 12/16/2016, 9:02 AM

## 2016-12-16 NOTE — Anesthesia Postprocedure Evaluation (Addendum)
Anesthesia Post Note  Patient: Jillian Wood  Procedure(s) Performed: Procedure(s) (LRB): TRANSESOPHAGEAL ECHOCARDIOGRAM (TEE) (N/A)  Patient location during evaluation: PACU Anesthesia Type: MAC Level of consciousness: awake Pain management: pain level controlled Vital Signs Assessment: post-procedure vital signs reviewed and stable Respiratory status: spontaneous breathing Cardiovascular status: stable Postop Assessment: no signs of nausea or vomiting Anesthetic complications: no        Last Vitals:  Vitals:   12/16/16 0724 12/16/16 0840  BP: 127/76 130/79  Pulse: 98 (!) 130  Resp: 15 18  Temp: 36.5 C 36.7 C    Last Pain:  Vitals:   12/16/16 0840  TempSrc: Oral  PainSc:    Pain Goal: Patients Stated Pain Goal: 0 (12/15/16 0941)               Santina Trillo JR,JOHN Mateo Flow

## 2016-12-16 NOTE — Discharge Summary (Signed)
Physician Discharge Summary  Jillian Wood ZPH:[id number] DOB: [birth year] DOA: 12/09/2016  PCP: Alesia Richards, MD  Admit date: 12/09/2016 Discharge date: 12/16/2016  Admitted From:Home Disposition:home   Recommendations for Outpatient Follow-up:  1. Follow up with PCP and cardiologist in 1-2 weeks 2. Please obtain BMP/CBC in one week   Home Health:yes Equipment/Devices:no Discharge Condition:stable CODE STATUS:full Diet recommendation:carb modified heart healthy diet  Brief/Interim Summary:71 year old female with history of COPD with chronic hypoxic respiratory failure (on 5 L via nasal cannula), chronic diastolic CHF, hypertension, hypothyroidism, GERD was sent to the ED by her PCP for shortness of breath and increased oxygen requirement Patient placed on BiPAP on admission. Also went into rapid A. fib. Admitted to stepdown unit on BiPAP and Cardizem drip  # Acute on chronic respiratory failure with hypoxia: Due to combination of COPD exacerbation and acute on chronic diastolic congestive heart failure. Patient is requiring about 6 L of oxygen. Patient uses about 5-6 liters of oxygen at home. Denied shortness of breath or chest pain today.  #COPD with acute exacerbation: treated with bronchodilators, azithromycin, Mucinex. Clinically improved. Discharge with tapering dose of prednisone. Continue bronchodilators. Recommended patient to follow-up with pulmonologist.  #Acute on chronic diastolic congestive heart failure: volume status improved, on oral lasix and acetazolamide.  -Echocardiogram showed EF of 65-70%. Echocardiogram also showed a mobile mass noted on the ventricular side of the mitral valve this could be vegetation or thrombus. Patient underwent TEE todaywhich was atypical for vegetation likely degenerative changes as per cardiologist. Patient is clinically asymptomatic. Discharge with outpatient follow-up with cardiologist.  #Atrial flutter with rapid ventricular  response: on oral Cardizem and bisoprolol. Elevated heart rate at this morning during TEE. Mostly heart rate controlled with the current regimen. On Coumadin for systemic anticoagulantion. I recommended to manage Coumadin dose as per INR. Patient verbalized understanding.  #Type 2 diabetes: On metformin at home. Last A1c 5.2.  Monitor blood sugar level. Recommended PCP follow-up.  #Essential hypertension: Blood pressure acceptable. Continue to monitor.   #Hypothyroidism: Continue Synthroid.  Patient is clinically improved. PE with atypical as it is likely degenerative change as per cardiologist. Patient denied fever, chills, headache, dizziness, chest pain, shortness of breath. Discharge with home care services. I recommended patient to follow-up with PCP, pulmonologist, cardiologist.  Discharge Diagnoses:  Principal Problem:   COPD with acute exacerbation ([place]) Active Problems:   Essential hypertension   Hypothyroidism   Acute on chronic diastolic CHF (congestive heart failure) ([facility])   GERD (gastroesophageal reflux disease)   Acute on chronic respiratory failure with hypoxia ([facility])   Atrial flutter with rapid ventricular response ([place])   Diabetes mellitus type 2 in obese ([place])   Abnormality of mitral valve annulus    Discharge Instructions  Discharge Instructions    (HEART FAILURE PATIENTS) Call MD:  Anytime you have any of the following symptoms: 1) 3 pound weight gain in 24 hours or 5 pounds in 1 week 2) shortness of breath, with or without a dry hacking cough 3) swelling in the hands, feet or stomach 4) if you have to sleep on extra pillows at night in order to breathe.    Complete by:  As directed    Call MD for:  difficulty breathing, headache or visual disturbances    Complete by:  As directed    Call MD for:  extreme fatigue    Complete by:  As directed    Call MD for:  hives    Complete by:  As directed  Call MD for:  persistant dizziness or light-headedness     Complete by:  As directed    Call MD for:  persistant nausea and vomiting    Complete by:  As directed    Call MD for:  redness, tenderness, or signs of infection (pain, swelling, redness, odor or green/yellow discharge around incision site)    Complete by:  As directed    Call MD for:  severe uncontrolled pain    Complete by:  As directed    Call MD for:  temperature >100.4    Complete by:  As directed    Diet - low sodium heart healthy    Complete by:  As directed    Diet Carb Modified    Complete by:  As directed    Discharge instructions    Complete by:  As directed    Please follow up with your PCP, cardiologist and pulmonologist. Check INR and adjust coumadin as you were doing before.   Increase activity slowly    Complete by:  As directed      Allergies as of 12/16/2016      Reactions   Inderal [propranolol] Other (See Comments)   Hair loss   Ace Inhibitors Cough      Medication List    TAKE these medications   acetaminophen-codeine 300-30 MG tablet Commonly known as:  TYLENOL #3 Take 1 tablet by mouth every 8 (eight) hours as needed for moderate pain or severe pain.   acetaZOLAMIDE 250 MG tablet Commonly known as:  DIAMOX Take 1 tablet (250 mg total) by mouth 2 (two) times daily. What changed:  medication strength  how much to take  how to take this  when to take this  additional instructions   albuterol 108 (90 Base) MCG/ACT inhaler Commonly known as:  PROVENTIL HFA;VENTOLIN HFA Inhale 1-2 puffs into the lungs every 6 (six) hours as needed for wheezing or shortness of breath.   aspirin 81 MG tablet Take 81 mg by mouth daily.   bisoprolol 10 MG tablet Commonly known as:  ZEBETA Take 1 tablet (10 mg total) by mouth daily.   budesonide-formoterol 160-4.5 MCG/ACT inhaler Commonly known as:  SYMBICORT 2 puffs twice a day, wash mouth afterwards What changed:  how much to take  how to take this  when to take this  additional instructions    diltiazem 300 MG 24 hr capsule Commonly known as:  CARDIZEM CD TAKE 1 CAPSULE (300 MG TOTAL) BY MOUTH DAILY.   DULoxetine 60 MG capsule Commonly known as:  CYMBALTA TAKE 1 CAPSULE BY MOUTH DAILY   famotidine 20 MG tablet Commonly known as:  PEPCID TAKE 1 TABLET (20 MG TOTAL) BY MOUTH 2 (TWO) TIMES DAILY. FOR ACID REFLUX   ferrous sulfate 325 (65 FE) MG tablet Take 325 mg by mouth 2 (two) times daily with a meal.   fluticasone 50 MCG/ACT nasal spray Commonly known as:  FLONASE Place 2 sprays into both nostrils daily. What changed:  when to take this  reasons to take this   furosemide 80 MG tablet Commonly known as:  LASIX TAKE 1 TABLET (80 MG TOTAL) BY MOUTH DAILY.   guaiFENesin 600 MG 12 hr tablet Commonly known as:  MUCINEX Take 600 mg by mouth 2 (two) times daily.   levothyroxine 175 MCG tablet Commonly known as:  SYNTHROID, LEVOTHROID TAKE 1 TABLET (175 MCG TOTAL) BY MOUTH DAILY BEFORE BREAKFAST.   loratadine-pseudoephedrine 10-240 MG 24 hr tablet Commonly known as:  CLARITIN-D 24-hour Take 1 tablet by mouth as needed for allergies.   losartan 50 MG tablet Commonly known as:  COZAAR TAKE 1 TABLET BY MOUTH DAILY   MAGNESIUM DR PO Take 1 capsule by mouth daily.   metFORMIN 500 MG 24 hr tablet Commonly known as:  GLUCOPHAGE-XR TAKE 1 TABLET (500 MG TOTAL) BY MOUTH 4 (FOUR) TIMES DAILY - AFTER MEALS AND AT BEDTIME.   OXYGEN Inhale 5-6 L into the lungs continuous.   pravastatin 40 MG tablet Commonly known as:  PRAVACHOL TAKE 1 TABLET (40 MG TOTAL) BY MOUTH DAILY.   predniSONE 10 MG tablet Commonly known as:  DELTASONE Take 1 tablet (10 mg total) by mouth daily with breakfast. 30 mg daily for 2 days, 20 mg daily for 2 days, 10 mg daily for 2 days and then stop.   VITAMIN D PO Take 2,000 Units by mouth daily.   warfarin 5 MG tablet Commonly known as:  COUMADIN Take 1 to 1.5 tablets daily or as directed. What changed:  how much to take  how to take  this  when to take this  additional instructions      Follow-up Information    MCKEOWN,WILLIAM DAVID, MD. Schedule an appointment as soon as possible for a visit in 1 week(s).   Specialty:  Internal Medicine Contact information: [address] [place] [address] [phone]        Quay Burow, MD. Schedule an appointment as soon as possible for a visit in 2 week(s).   Specialties:  Cardiology, Radiology Contact information: [address] [place] [address] [place] [address] [phone]          Allergies  Allergen Reactions  . Inderal [Propranolol] Other (See Comments)    Hair loss  . Ace Inhibitors Cough    Consultations: Cardiology  Procedures/Studies: BiPAP, T   Subjective: Patient was seen and examined at bedside. Keppra the. Reported doing much better. Denied headache, dizziness, nausea, vomiting, chest pain, shortness of breath. Eager to go home today.  Discharge Exam: Vitals:   12/16/16 0910 12/16/16 1115  BP: (!) 113/92 101/76  Pulse: (!) 131 (!) 106  Resp: 18 18  Temp: 98.3 F (36.8 C) 98.5 F (36.9 C)   Vitals:   12/16/16 0845 12/16/16 0850 12/16/16 0910 12/16/16 1115  BP: 116/63 116/83 (!) 113/92 101/76  Pulse: (!) 130 (!) 129 (!) 131 (!) 106  Resp: (!) 22 20 18 18   Temp:   98.3 F (36.8 C) 98.5 F (36.9 C)  TempSrc:   Oral Oral  SpO2: 94% 97% 94% 93%  Weight:      Height:        General: Pt is alert, awake, not in acute distress Cardiovascular: RRR, S1/S2 +, no rubs, no gallops Respiratory: CTA bilaterally, no wheezing, no rhonchi Abdominal: Soft, NT, ND, bowel sounds + Extremities: no edema, no cyanosis    The results of significant diagnostics from this hospitalization (including imaging, microbiology, ancillary and laboratory) are listed below for reference.     Microbiology: Recent Results (from the past 240 hour(s))  MRSA PCR Screening     Status: None   Collection Time: 12/09/16 11:37 PM   Result Value Ref Range Status   MRSA by PCR NEGATIVE NEGATIVE Final    Comment:        The GeneXpert MRSA Assay (FDA approved for NASAL specimens only), is one component of a comprehensive MRSA colonization surveillance program. It is not intended to diagnose MRSA infection  nor to guide or monitor treatment for MRSA infections.      Labs: BNP (last 3 results)  Recent Labs  07/31/16 1805 11/11/16 1853 12/09/16 1750  BNP 448.9* 352.4* 474.2*   Basic Metabolic Panel:  Recent Labs Lab 12/09/16 1750 12/10/16 0240 12/11/16 0326 12/12/16 0428  NA 140 139 139 137  K 4.3 3.8 3.9 4.4  CL 99* 97* 93* 92*  CO2 30 31 33* 35*  GLUCOSE 129* 188* 212* 232*  BUN 10 14 22* 38*  CREATININE 0.65 0.63 0.82 0.98  CALCIUM 9.2 9.3 9.4 9.6   Liver Function Tests: No results for input(s): AST, ALT, ALKPHOS, BILITOT, PROT, ALBUMIN in the last 168 hours. No results for input(s): LIPASE, AMYLASE in the last 168 hours. No results for input(s): AMMONIA in the last 168 hours. CBC:  Recent Labs Lab 12/09/16 1750  WBC 10.2  HGB 10.1*  HCT 36.2  MCV 82.6  PLT 241   Cardiac Enzymes: No results for input(s): CKTOTAL, CKMB, CKMBINDEX, TROPONINI in the last 168 hours. BNP: Invalid input(s): POCBNP CBG:  Recent Labs Lab 12/15/16 1101 12/15/16 1624 12/15/16 2141 12/16/16 0908 12/16/16 1059  GLUCAP 180* 288* 287* 153* 358*   D-Dimer No results for input(s): DDIMER in the last 72 hours. Hgb A1c No results for input(s): HGBA1C in the last 72 hours. Lipid Profile No results for input(s): CHOL, HDL, LDLCALC, TRIG, CHOLHDL, LDLDIRECT in the last 72 hours. Thyroid function studies No results for input(s): TSH, T4TOTAL, T3FREE, THYROIDAB in the last 72 hours.  Invalid input(s): FREET3 Anemia work up No results for input(s): VITAMINB12, FOLATE, FERRITIN, TIBC, IRON, RETICCTPCT in the last 72 hours. Urinalysis    Component Value Date/Time   COLORURINE YELLOW 07/27/2015 1612    APPEARANCEUR CLEAR 07/27/2015 1612   LABSPEC 1.017 07/27/2015 1612   PHURINE 6.0 07/27/2015 1612   GLUCOSEU NEGATIVE 07/27/2015 1612   HGBUR 2+ (A) 07/27/2015 1612   BILIRUBINUR NEGATIVE 07/27/2015 1612   KETONESUR NEGATIVE 07/27/2015 1612   PROTEINUR NEGATIVE 07/27/2015 1612   UROBILINOGEN 1.0 10/23/2009 1145   NITRITE NEGATIVE 07/27/2015 1612   LEUKOCYTESUR NEGATIVE 07/27/2015 1612   Sepsis Labs Invalid input(s): PROCALCITONIN,  WBC,  LACTICIDVEN Microbiology Recent Results (from the past 240 hour(s))  MRSA PCR Screening     Status: None   Collection Time: 12/09/16 11:37 PM  Result Value Ref Range Status   MRSA by PCR NEGATIVE NEGATIVE Final    Comment:        The GeneXpert MRSA Assay (FDA approved for NASAL specimens only), is one component of a comprehensive MRSA colonization surveillance program. It is not intended to diagnose MRSA infection nor to guide or monitor treatment for MRSA infections.      Time coordinating discharge: 32 minutes  SIGNED:   Rosita Fire, MD  [facility] 12/16/2016, 11:46 AM  If 7PM-7AM, please contact night-coverage [email or web address] Password [email or web address]

## 2016-12-16 NOTE — Care Management Note (Signed)
Case Management Note Previous CM note initiated by Bethena Roys, RN--12/12/2016, 2:49 PM   Patient Details  Name: Jillian Wood MRN: [id number] Date of Birth: [birth year]  Subjective/Objective:    Pt presented for COPD exacerbation. Pt is from home with husband and adult children. Pt has DME 02 via [employer]-will have travel tank for home, Cane, Rollator, Rw and 3n1.                 Action/Plan: PT/OT recommendations for [facility]. Pt is refusing Services at this time. Pt states she has had services before and were not beneficial. No further needs from CM at this time.   Expected Discharge Date:  12/16/16               Expected Discharge Plan:  [place]  In-House Referral:  NA  Discharge planning Services  CM Consult  Post Acute Care Choice:  Home Health Choice offered to:  NA  DME Arranged:  N/A DME Agency:  NA  HH Arranged:  Patient Refused HH, RN, PT, OT, Nurse's Aide, Respirator Therapy [place] Agency:  NA  Status of Service:  Completed, signed off  If discussed at [place] of Stay Meetings, dates discussed:    Discharge Disposition: home/self care   Additional Comments:  12/16/16- 1200- Kerryann Allaire RN, CM- pt for d/c home today- orders placed for HHRN/PT/OT/aide/RT- spoke with pt at bedside to confirm that pt did not change her mind regarding [place] services- pt again politely declines any HH services stating that she does not feel that she would benefit from them. Pt reports that she has all needed DME needs. CM to sign off.   Dahlia Client [place], RN 12/16/2016, 12:11 PM [phone]

## 2016-12-16 NOTE — Op Note (Signed)
INDICATIONS: Mitral valve mass  PROCEDURE:   Informed consent was obtained prior to the procedure. The risks, benefits and alternatives for the procedure were discussed and the patient comprehended these risks.  Risks include, but are not limited to, cough, sore throat, vomiting, nausea, somnolence, esophageal and stomach trauma or perforation, bleeding, low blood pressure, aspiration, pneumonia, infection, trauma to the teeth and death.    After a procedural time-out, the oropharynx was anesthetized with 20% benzocaine spray.   During this procedure the patient was administered IV propofol by Anesthesiology.  The patient's heart rate, blood pressure, and oxygen saturationweare monitored continuously during the procedure.   The transesophageal probe was inserted in the esophagus and stomach without difficulty and multiple views were obtained.  The patient was kept under observation until the patient left the procedure room.  The patient left the procedure room in stable condition.   Agitated microbubble saline contrast was not administered.  COMPLICATIONS:    There were no immediate complications.  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 It has an atypical appearance for vegetation. Degenerative change (caseous necrosis is favored, but cannot exclude a papillary fibroelastoma or atypical myxoma. Moderate TR, Dilated right heart chambers c/w cor pulmonale. Normal LVEF. Severe aortic atherosclerosis.    Time Spent Directly with the Patient:  30 minutes   Jillian Wood 12/16/2016, 8:37 AM

## 2016-12-16 NOTE — Transfer of Care (Signed)
Immediate Anesthesia Transfer of Care Note  Patient: NADJA LINA  Procedure(s) Performed: Procedure(s): TRANSESOPHAGEAL ECHOCARDIOGRAM (TEE) (N/A)  Patient Location: Endoscopy Unit  Anesthesia Type:MAC  Level of Consciousness: awake, alert  and oriented  Airway & Oxygen Therapy: Patient Spontanous Breathing and Patient connected to nasal cannula oxygen  Post-op Assessment: Report given to RN and Post -op Vital signs reviewed and stable  Post vital signs: Reviewed and stable  Last Vitals:  Vitals:   12/16/16 0724 12/16/16 0840  BP: 127/76 130/79  Pulse: 98 (!) 130  Resp: 15 18  Temp: 36.5 C 36.7 C    Last Pain:  Vitals:   12/16/16 0840  TempSrc: Oral  PainSc:       Patients Stated Pain Goal: 0 (97/67/34 1937)  Complications: No apparent anesthesia complications

## 2016-12-17 DIAGNOSIS — J449 Chronic obstructive pulmonary disease, unspecified: Secondary | ICD-10-CM | POA: Diagnosis not present

## 2016-12-19 ENCOUNTER — Encounter (HOSPITAL_COMMUNITY): Payer: Self-pay | Admitting: Cardiovascular Disease

## 2016-12-25 ENCOUNTER — Other Ambulatory Visit: Payer: Self-pay | Admitting: Internal Medicine

## 2016-12-25 ENCOUNTER — Ambulatory Visit: Payer: Self-pay | Admitting: Physician Assistant

## 2016-12-25 ENCOUNTER — Other Ambulatory Visit: Payer: Self-pay | Admitting: Physician Assistant

## 2016-12-26 ENCOUNTER — Encounter: Payer: Self-pay | Admitting: Physician Assistant

## 2016-12-26 ENCOUNTER — Ambulatory Visit (INDEPENDENT_AMBULATORY_CARE_PROVIDER_SITE_OTHER): Payer: PPO | Admitting: Physician Assistant

## 2016-12-26 VITALS — BP 122/80 | HR 110 | Temp 97.4°F | Resp 16 | Ht 67.0 in | Wt 245.2 lb

## 2016-12-26 DIAGNOSIS — I4892 Unspecified atrial flutter: Secondary | ICD-10-CM | POA: Diagnosis not present

## 2016-12-26 DIAGNOSIS — Q2388 Other congenital malformations of aortic and mitral valves: Secondary | ICD-10-CM

## 2016-12-26 DIAGNOSIS — Q248 Other specified congenital malformations of heart: Secondary | ICD-10-CM | POA: Diagnosis not present

## 2016-12-26 DIAGNOSIS — Q238 Other congenital malformations of aortic and mitral valves: Secondary | ICD-10-CM

## 2016-12-26 DIAGNOSIS — J441 Chronic obstructive pulmonary disease with (acute) exacerbation: Secondary | ICD-10-CM | POA: Diagnosis not present

## 2016-12-26 DIAGNOSIS — I5033 Acute on chronic diastolic (congestive) heart failure: Secondary | ICD-10-CM | POA: Diagnosis not present

## 2016-12-26 DIAGNOSIS — Z79899 Other long term (current) drug therapy: Secondary | ICD-10-CM | POA: Diagnosis not present

## 2016-12-26 DIAGNOSIS — I10 Essential (primary) hypertension: Secondary | ICD-10-CM | POA: Diagnosis not present

## 2016-12-26 DIAGNOSIS — J9621 Acute and chronic respiratory failure with hypoxia: Secondary | ICD-10-CM | POA: Diagnosis not present

## 2016-12-26 NOTE — Patient Instructions (Addendum)
Going to send in medication to put in your duoneb to use daily Use Brovana/pulmicort twice daily Use albuterol/ipratropium 3 x a day or can do every 4 hours if needed for exacerbation  Weigh self daily  Use BiPAP   Do the following things EVERYDAY: 1) Weigh yourself in the morning before breakfast or at the same time every day. Write it down and keep it in a log. 2) Take your medicines as prescribed 3) Eat low salt foods-Limit salt (sodium) to 2000 mg per day. Best thing to do is avoid processed foods.   4) Stay as active as you can everyday 5) Limit all fluids for the day to less than 1.5 liters  Call your doctor if:  Anytime you have any of the following symptoms:  1) 2 pound weight gain in 24 hours or 5 pounds in 1 week  2) shortness of breath, with or without a dry hacking cough  3) swelling in the hands, LEGs, feet or stomach  4) if you have to sleep on extra pillows at night in order to breathe. 5) after laying down at night for 20-30 mins, you wake up short of breath.   These can all be signs of fluid overload.

## 2016-12-26 NOTE — Progress Notes (Signed)
Hospital follow up  Assessment and Plan:  COPD with acute exacerbation ([place]) Follow up with pulmonary, continue O2 and bipap, has difficult time with inhalers, will try to send in pulmicort for part B medicare and albuterol/ipratropium to do daily  Acute on chronic respiratory failure with hypoxia ([place]) Follow up with pulmonary, continue O2 and bipap, has difficult time with inhalers, will try to send in pulmicort for part B medicare and albuterol/ipratropium to do daily Try to keep O2 at home closer to 95%  Abnormality of mitral valve annulus Has follow up cardiology On coumadin  Acute on chronic diastolic CHF (congestive heart failure) (HCC) Discussed weighing self daily, symptoms of fluid overload, restricting fluids/salt Has follow up cardiology  Atrial flutter with rapid ventricular response (HCC) Check coumadin, no easy bleeding/bruising at this time With recent ABX and prednisone will get INR Rate controlled, continue to follow cardio, has OV the 17th -     Protime-INR  Essential hypertension - continue medications, DASH diet, exercise and monitor at home. Call if greater than 130/80.  -     CBC with Differential/Platelet -     BASIC METABOLIC PANEL WITH GFR -     Hepatic function panel  Morbid Obesity with co morbidities - long discussion about weight loss, diet, and exercise  Medication management -     Magnesium    Hospital discharge meds were reviewed, and reconciled with the patient.   Medications Discontinued During This Encounter  Medication Reason  . predniSONE (DELTASONE) 10 MG tablet Completed Course    Over 40 minutes of exam, counseling, chart review, and complex, high/moderate level critical decision making was performed this visit.   Future Appointments Date Time Provider [place]  12/31/2016 3:00 PM Lonn Georgia, PA-C [facility]  01/07/2017 3:30 PM Tanda Rockers, MD [facility] None  01/08/2017 8:00 PM [facility]OM 7  [facility]  01/09/2017 3:45 PM [name], PA-C [facility] None  01/27/2017 2:30 PM Lonn Georgia, PA-C [facility]  02/11/2017 2:45 PM Unk Pinto, MD [facility] None  12/16/2017 3:00 PM Unk Pinto, MD [facility] None      HPI 71 y.o.female presents for follow up for transition from recent hospitalization or SNIF stay. Admit date to the hospital was 12/09/16, patient was discharged from the hospital on 12/16/16 and our clinical staff contacted the office the day after discharge to set up a follow up appointment, patient was admitted for:   Acute COPD exacerbation, acute on chronic respiratory failure, acute on chronic diastolic heart failure, atrial flutter.  She presented to the office with weakness and 3 day history of SOB requiring increase in O2 at 8 L, send to ER and admitted. While there she was diuresed and started on ABX/prednisone, now doing much better. However while doing an echo they found an abnormality on her mitral valve, had TEE and thought to be degenerative changes, will follow up outpatient with Dr. Gwenlyn Found. She is on coumadin for her atrial flutter and we will check this today to see if she is therapeutic.   She states her breathing is much better since discharge, she has stopped the prednisone, she is not on any duonebs at home or any inhalers at home at this time, she was on symbicort but states that she has a hard time getting it in her lungs, she is down 10 lbs. She is on 5 L at home which is better than where it was.She is on diamox 250mg  BID.  She has  been taking lasix once daily unless she starts having swelling her legs and then she will take a second. She weighs herself at home. She is taking coumadin 1.5 on Tuesday/Thursday and 1 the rest of the days. Has been wearing BiPAP, has sleep study the 25th.   Lab Results  Component Value Date   INR 4.4 (H) 12/26/2016   INR 2.46 12/16/2016   INR 2.27 12/15/2016    Lab Results  Component Value Date    WBC 7.8 12/26/2016   HGB 11.2 (L) 12/26/2016   HCT 39.0 12/26/2016   MCV 79.8 (L) 12/26/2016   PLT 231 12/26/2016   Lab Results  Component Value Date   CREATININE 0.75 12/26/2016   BUN 26 (H) 12/26/2016   NA 140 12/26/2016   K 3.9 12/26/2016   CL 98 12/26/2016   CO2 32 (H) 12/26/2016   BMI is Body mass index is 38.4 kg/m., she is working on diet and exercise. Wt Readings from Last 3 Encounters:  12/26/16 245 lb 3.2 oz (111.2 kg)  12/16/16 250 lb 10.6 oz (113.7 kg)  12/09/16 255 lb (115.7 kg)    Images while in the hospital: Dg Chest Portable 1 View  Result Date: 12/09/2016 CLINICAL DATA:  71 year old female with shortness of breath and cough for 2-3 days. Initial encounter. EXAM: PORTABLE CHEST 1 VIEW COMPARISON:  11/11/2016 07/31/2016. FINDINGS: Pulmonary edema superimposed upon chronic changes. Right-sided pleural effusion suspected. Basilar atelectasis greater on the right. Cannot exclude basilar infiltrate or mass. Cardiomegaly. Calcified aorta. Shoulder joint degenerative change greater on left. IMPRESSION: Pulmonary edema superimposed upon chronic changes. Right-sided pleural effusion suspected. Basilar atelectasis greater on the right. Cannot exclude basilar infiltrate or mass. Cardiomegaly. Electronically Signed   By: Genia Del M.D.   On: 12/09/2016 18:13    Past Medical History:  Diagnosis Date  . Arthritis   . CHF (congestive heart failure) ([place])   . COPD (chronic obstructive pulmonary disease) ([place])   . Depression   . Diabetes mellitus type 2 in obese ([place])   . GERD (gastroesophageal reflux disease)   . Hyperlipidemia   . Hypertension   . Morbid obesity ([place])   . OAB (overactive bladder)   . PSVT (paroxysmal supraventricular tachycardia) ([place])   . Thyroid disease   . Vitamin D deficiency      Allergies  Allergen Reactions  . Inderal [Propranolol] Other (See Comments)    Hair loss  . Ace Inhibitors Cough      Current Outpatient Prescriptions on File  Prior to Visit  Medication Sig Dispense Refill  . acetaminophen-codeine (TYLENOL #3) 300-30 MG tablet Take 1 tablet by mouth every 8 (eight) hours as needed for moderate pain or severe pain. 90 tablet 0  . acetaZOLAMIDE (DIAMOX) 250 MG tablet Take 1 tablet (250 mg total) by mouth 2 (two) times daily. 30 tablet 0  . albuterol (PROVENTIL HFA;VENTOLIN HFA) 108 (90 Base) MCG/ACT inhaler Inhale 1-2 puffs into the lungs every 6 (six) hours as needed for wheezing or shortness of breath. 18 g 2  . aspirin 81 MG tablet Take 81 mg by mouth daily.    . bisoprolol (ZEBETA) 10 MG tablet Take 1 tablet (10 mg total) by mouth daily. 30 tablet 0  . budesonide-formoterol (SYMBICORT) 160-4.5 MCG/ACT inhaler 2 puffs twice a day, wash mouth afterwards (Patient taking differently: Inhale 2 puffs into the lungs 2 (two) times daily. ) 1 Inhaler 0  . Cholecalciferol (VITAMIN D PO) Take 2,000 Units by mouth daily.     Marland Kitchen  diltiazem (CARDIZEM CD) 300 MG 24 hr capsule TAKE 1 CAPSULE (300 MG TOTAL) BY MOUTH DAILY. 90 capsule 1  . DULoxetine (CYMBALTA) 60 MG capsule TAKE 1 CAPSULE BY MOUTH DAILY 90 capsule 1  . famotidine (PEPCID) 20 MG tablet TAKE 1 TABLET (20 MG TOTAL) BY MOUTH 2 (TWO) TIMES DAILY. FOR ACID REFLUX 180 tablet 1  . ferrous sulfate 325 (65 FE) MG tablet Take 325 mg by mouth 2 (two) times daily with a meal.    . fluticasone (FLONASE) 50 MCG/ACT nasal spray Place 2 sprays into both nostrils daily. (Patient taking differently: Place 2 sprays into both nostrils daily as needed for allergies. ) 16 g 0  . furosemide (LASIX) 80 MG tablet TAKE 1 TABLET (80 MG TOTAL) BY MOUTH DAILY. 90 tablet 0  . guaiFENesin (MUCINEX) 600 MG 12 hr tablet Take 600 mg by mouth 2 (two) times daily.     Marland Kitchen levothyroxine (SYNTHROID, LEVOTHROID) 175 MCG tablet TAKE 1 TABLET (175 MCG TOTAL) BY MOUTH DAILY BEFORE BREAKFAST. 90 tablet 1  . loratadine-pseudoephedrine (CLARITIN-D 24-HOUR) 10-240 MG per 24 hr tablet Take 1 tablet by mouth as needed for  allergies.     Marland Kitchen losartan (COZAAR) 50 MG tablet TAKE 1 TABLET BY MOUTH DAILY 90 tablet 2  . Magnesium Chloride (MAGNESIUM DR PO) Take 1 capsule by mouth daily.     . metFORMIN (GLUCOPHAGE-XR) 500 MG 24 hr tablet TAKE 1 TABLET (500 MG TOTAL) BY MOUTH 4 (FOUR) TIMES DAILY - AFTER MEALS AND AT BEDTIME. 360 tablet 1  . OXYGEN-HELIUM IN Inhale 5-6 L into the lungs continuous.     . pravastatin (PRAVACHOL) 40 MG tablet TAKE 1 TABLET (40 MG TOTAL) BY MOUTH DAILY. 90 tablet 1  . warfarin (COUMADIN) 5 MG tablet Take 1 to 1.5 tablets daily or as directed. (Patient taking differently: Take 5-7.5 mg by mouth daily at 6 PM. TAKES 7.5MG  ON TUES AND THURS TAKES 5MG  ALL OTHER DAYS) 90 tablet 1   No current facility-administered medications on file prior to visit.     ROS: all negative except above.   Physical Exam: Filed Weights   12/26/16 1611  Weight: 245 lb 3.2 oz (111.2 kg)   BP 122/80   Pulse (!) 110   Temp 97.4 F (36.3 C)   Resp 16   Ht 5\' 7"  (1.702 m)   Wt 245 lb 3.2 oz (111.2 kg)   SpO2 98%   BMI 38.40 kg/m  General appearance: alert, no distress, WD/WN,  female HEENT: normocephalic, sclerae anicteric, TMs pearly, nares patent, no discharge or erythema, pharynx normal Oral cavity: MMM, no lesions Neck: supple, no lymphadenopathy, no thyromegaly, no masses Heart: Irreg irreg, normal S1, S2, systolic murmur Lungs: decreased breath sounds, with wheezing no rhonchi, no rales, 5 L O2 at 98% Abdomen: +bs, soft, non tender, non distended, no masses, no hepatomegaly, no splenomegaly Musculoskeletal: nontender, no swelling, no obvious deformity Extremities: no edema at this time, no cyanosis, no clubbing Pulses: 2+ symmetric, upper and lower extremities, normal cap refill Neurological: alert, oriented x 3, CN2-12 intact, strength normal upper extremities and lower extremities,  DTRs 2+ throughout, no cerebellar signs, patient in wheelchair due to SOB Psychiatric: normal affect, behavior  normal, pleasant     [name], PA-C 7:52 AM [facility]

## 2016-12-27 LAB — CBC WITH DIFFERENTIAL/PLATELET
BASOS PCT: 0 %
Basophils Absolute: 0 cells/uL (ref 0–200)
EOS ABS: 78 {cells}/uL (ref 15–500)
Eosinophils Relative: 1 %
HCT: 39 % (ref 35.0–45.0)
Hemoglobin: 11.2 g/dL — ABNORMAL LOW (ref 11.7–15.5)
Lymphocytes Relative: 17 %
Lymphs Abs: 1326 cells/uL (ref 850–3900)
MCH: 22.9 pg — ABNORMAL LOW (ref 27.0–33.0)
MCHC: 28.7 g/dL — ABNORMAL LOW (ref 32.0–36.0)
MCV: 79.8 fL — AB (ref 80.0–100.0)
MONO ABS: 468 {cells}/uL (ref 200–950)
MONOS PCT: 6 %
MPV: 9.4 fL (ref 7.5–12.5)
NEUTROS ABS: 5928 {cells}/uL (ref 1500–7800)
Neutrophils Relative %: 76 %
PLATELETS: 231 10*3/uL (ref 140–400)
RBC: 4.89 MIL/uL (ref 3.80–5.10)
RDW: 18.8 % — AB (ref 11.0–15.0)
WBC: 7.8 10*3/uL (ref 3.8–10.8)

## 2016-12-27 LAB — BASIC METABOLIC PANEL WITH GFR
BUN: 26 mg/dL — AB (ref 7–25)
CHLORIDE: 98 mmol/L (ref 98–110)
CO2: 32 mmol/L — ABNORMAL HIGH (ref 20–31)
Calcium: 9 mg/dL (ref 8.6–10.4)
Creat: 0.75 mg/dL (ref 0.60–0.93)
GFR, Est Non African American: 81 mL/min (ref >=60)
Glucose, Bld: 139 mg/dL — ABNORMAL HIGH (ref 65–99)
POTASSIUM: 3.9 mmol/L (ref 3.5–5.3)
Sodium: 140 mmol/L (ref 135–146)

## 2016-12-27 LAB — HEPATIC FUNCTION PANEL
ALBUMIN: 3.6 g/dL (ref 3.6–5.1)
ALK PHOS: 75 U/L (ref 33–130)
ALT: 10 U/L (ref 6–29)
AST: 13 U/L (ref 10–35)
BILIRUBIN DIRECT: 0.1 mg/dL (ref <=0.2)
BILIRUBIN INDIRECT: 0.1 mg/dL — AB (ref 0.2–1.2)
BILIRUBIN TOTAL: 0.2 mg/dL (ref 0.2–1.2)
Total Protein: 6.1 g/dL (ref 6.1–8.1)

## 2016-12-27 LAB — PROTIME-INR
INR: 4.4 — ABNORMAL HIGH
Prothrombin Time: 44.2 s — ABNORMAL HIGH (ref 9.0–11.5)

## 2016-12-27 LAB — MAGNESIUM: MAGNESIUM: 1.6 mg/dL (ref 1.5–2.5)

## 2016-12-31 ENCOUNTER — Other Ambulatory Visit: Payer: Self-pay | Admitting: *Deleted

## 2016-12-31 ENCOUNTER — Ambulatory Visit: Payer: PPO | Admitting: Physician Assistant

## 2016-12-31 MED ORDER — BISOPROLOL FUMARATE 10 MG PO TABS: 10.0000 mg | ORAL_TABLET | Freq: Every day | ORAL | 1 refills | Status: DC

## 2017-01-07 ENCOUNTER — Institutional Professional Consult (permissible substitution): Payer: PPO | Admitting: Internal Medicine

## 2017-01-08 ENCOUNTER — Encounter (HOSPITAL_BASED_OUTPATIENT_CLINIC_OR_DEPARTMENT_OTHER): Payer: PPO

## 2017-01-08 NOTE — Progress Notes (Addendum)
Assessment and Plan:  Hypertension:  -Continue medication -monitor blood pressure at home. -Continue DASH diet -Reminder to go to the ER if any CP, SOB, nausea, dizziness, severe HA, changes vision/speech, left arm numbness and tingling and jaw pain.  Cholesterol - Continue diet and exercise -Check cholesterol.   Diabetes with diabetic chronic kidney disease  -well controlled with current medications -Continue diet and exercise.  -Check A1C  Vitamin D Def -check level -continue medications.   COPD oxygen dependent -cont O2 -cont inhalers - due to severe COPD will change from symbicort to budesonide BID via nebulizer to improve pulmonary hygiene and increase expectoration, continue inhaler PRN and DUONEB TID at this time.   Hypothyroidism -tsh -cont levothyroxine  Acute on chronic diastolic CHF (congestive heart failure) (HCC) Weight down another 5lbs!, continue to decrease weight and monitor at home -     BASIC METABOLIC PANEL WITH GFR  Atrial flutter with rapid ventricular response (HCC) -     Protime-INR  Pulmonary hypertension ([place]) Stressed importance of using bipap  Morbid Obesity with co morbidities - long discussion about weight loss, diet, and exercise   Continue diet and meds as discussed. Further disposition pending results of labs. Discussed med's effects and SE's.    HPI 71 y.o. female  presents for 3 month follow up with hypertension, hyperlipidemia, diabetes and vitamin D deficiency.   Her blood pressure has been controlled at home, today their BP is  .She does not workout. She denies chest pain, shortness of breath, dizziness.  Patient has severe end stage COPD with CO2 retention, she is on 5 L of O2 depending on activity level and on diamox for CO2 retention. She walks with walker or is in wheel chair, no falls.  She is unable to do house work due to her COPD.Sees pulmonary tomorrow and get sleep study on the 24th, she is wearing her bipap.  She has  Afib is on coumadin, it was changed last time to 2 days without and now on 7.5mg  2 days and 5 mg 5 days.   Lab Results  Component Value Date   INR 4.4 (H) 12/26/2016   INR 2.46 12/16/2016   INR 2.27 12/15/2016     She is on cholesterol medication and denies myalgias. Her cholesterol is at goal. The cholesterol was:  06/06/2016: Cholesterol 180; HDL 75; LDL Cholesterol 68; Triglycerides 187   She has been working on diet and exercise for diabetes with diabetic chronic kidney disease, she is on bASA, she is on ACE/ARB, and denies  foot ulcerations, hyperglycemia, hypoglycemia , increased appetite, nausea, paresthesia of the feet, polydipsia, polyuria, visual disturbances, vomiting and weight loss. Last A1C was: 06/06/2016: Hgb A1c MFr Bld 5.2   Patient is on Vitamin D supplement. 02/29/2016: Vit D, 25-Hydroxy 45   She is on thyroid medication. Her medication was not changed last visit.   Lab Results  Component Value Date   TSH 6.576 (H) 11/12/2016   BMI is There is no height or weight on file to calculate BMI., she is working on diet and exercise. Wt Readings from Last 3 Encounters:  12/26/16 245 lb 3.2 oz (111.2 kg)  12/16/16 250 lb 10.6 oz (113.7 kg)  12/09/16 255 lb (115.7 kg)     Current Medications:  Current Outpatient Prescriptions on File Prior to Visit  Medication Sig Dispense Refill  . acetaminophen-codeine (TYLENOL #3) 300-30 MG tablet Take 1 tablet by mouth every 8 (eight) hours as needed for moderate pain or severe  pain. 90 tablet 0  . acetaZOLAMIDE (DIAMOX) 250 MG tablet Take 1 tablet (250 mg total) by mouth 2 (two) times daily. 30 tablet 0  . albuterol (PROVENTIL HFA;VENTOLIN HFA) 108 (90 Base) MCG/ACT inhaler Inhale 1-2 puffs into the lungs every 6 (six) hours as needed for wheezing or shortness of breath. 18 g 2  . aspirin 81 MG tablet Take 81 mg by mouth daily.    . bisoprolol (ZEBETA) 10 MG tablet Take 1 tablet (10 mg total) by mouth daily. 90 tablet 1  .  budesonide-formoterol (SYMBICORT) 160-4.5 MCG/ACT inhaler 2 puffs twice a day, wash mouth afterwards (Patient taking differently: Inhale 2 puffs into the lungs 2 (two) times daily. ) 1 Inhaler 0  . Cholecalciferol (VITAMIN D PO) Take 2,000 Units by mouth daily.     Marland Kitchen diltiazem (CARDIZEM CD) 300 MG 24 hr capsule TAKE 1 CAPSULE (300 MG TOTAL) BY MOUTH DAILY. 90 capsule 1  . DULoxetine (CYMBALTA) 60 MG capsule TAKE 1 CAPSULE BY MOUTH DAILY 90 capsule 1  . famotidine (PEPCID) 20 MG tablet TAKE 1 TABLET (20 MG TOTAL) BY MOUTH 2 (TWO) TIMES DAILY. FOR ACID REFLUX 180 tablet 1  . ferrous sulfate 325 (65 FE) MG tablet Take 325 mg by mouth 2 (two) times daily with a meal.    . fluticasone (FLONASE) 50 MCG/ACT nasal spray Place 2 sprays into both nostrils daily. (Patient taking differently: Place 2 sprays into both nostrils daily as needed for allergies. ) 16 g 0  . furosemide (LASIX) 80 MG tablet TAKE 1 TABLET (80 MG TOTAL) BY MOUTH DAILY. 90 tablet 0  . guaiFENesin (MUCINEX) 600 MG 12 hr tablet Take 600 mg by mouth 2 (two) times daily.     Marland Kitchen levothyroxine (SYNTHROID, LEVOTHROID) 175 MCG tablet TAKE 1 TABLET (175 MCG TOTAL) BY MOUTH DAILY BEFORE BREAKFAST. 90 tablet 1  . loratadine-pseudoephedrine (CLARITIN-D 24-HOUR) 10-240 MG per 24 hr tablet Take 1 tablet by mouth as needed for allergies.     Marland Kitchen losartan (COZAAR) 50 MG tablet TAKE 1 TABLET BY MOUTH DAILY 90 tablet 2  . Magnesium Chloride (MAGNESIUM DR PO) Take 1 capsule by mouth daily.     . metFORMIN (GLUCOPHAGE-XR) 500 MG 24 hr tablet TAKE 1 TABLET (500 MG TOTAL) BY MOUTH 4 (FOUR) TIMES DAILY - AFTER MEALS AND AT BEDTIME. 360 tablet 1  . OXYGEN-HELIUM IN Inhale 5-6 L into the lungs continuous.     . pravastatin (PRAVACHOL) 40 MG tablet TAKE 1 TABLET (40 MG TOTAL) BY MOUTH DAILY. 90 tablet 1  . warfarin (COUMADIN) 5 MG tablet Take 1 to 1.5 tablets daily or as directed. (Patient taking differently: Take 5-7.5 mg by mouth daily at 6 PM. TAKES 7.5MG  ON TUES  AND THURS TAKES 5MG  ALL OTHER DAYS) 90 tablet 1   No current facility-administered medications on file prior to visit.    Medical History:  Past Medical History:  Diagnosis Date  . Arthritis   . CHF (congestive heart failure) ([place])   . COPD (chronic obstructive pulmonary disease) ([place])   . Depression   . Diabetes mellitus type 2 in obese ([place])   . GERD (gastroesophageal reflux disease)   . Hyperlipidemia   . Hypertension   . Morbid obesity ([place])   . OAB (overactive bladder)   . PSVT (paroxysmal supraventricular tachycardia) ([place])   . Thyroid disease   . Vitamin D deficiency    Allergies:  Allergies  Allergen Reactions  . Inderal [Propranolol] Other (See Comments)  Hair loss  . Ace Inhibitors Cough     Review of Systems:  Review of Systems  Constitutional: Negative for chills, fever and malaise/fatigue.  HENT: Negative for congestion, ear pain and sore throat.   Eyes: Negative.   Respiratory: Negative for cough, shortness of breath and wheezing.   Cardiovascular: Negative for chest pain, palpitations and leg swelling.  Gastrointestinal: Negative for abdominal pain, blood in stool, constipation, diarrhea, heartburn and melena.  Genitourinary: Negative.   Skin: Negative.   Neurological: Negative for dizziness, sensory change, loss of consciousness and headaches.  Psychiatric/Behavioral: Negative for depression. The patient is not nervous/anxious and does not have insomnia.     Family history- Review and unchanged  Social history- Review and unchanged  Physical Exam: There were no vitals taken for this visit. Wt Readings from Last 3 Encounters:  12/26/16 245 lb 3.2 oz (111.2 kg)  12/16/16 250 lb 10.6 oz (113.7 kg)  12/09/16 255 lb (115.7 kg)   General appearance: alert, no distress, WD/WN,  female HEENT: normocephalic, sclerae anicteric, TMs pearly, nares patent, no discharge or erythema, pharynx normal Oral cavity: MMM, no lesions Neck: supple, no  lymphadenopathy, no thyromegaly, no masses Heart: Irreg irreg, normal S1, S2, systolic murmur Lungs: decreased breath sounds, no wheezing no rhonchi, no rales, 5 L O2  Abdomen: +bs, soft, non tender, non distended, no masses, no hepatomegaly, no splenomegaly Musculoskeletal: nontender, no swelling, no obvious deformity Extremities: no edema at this time, no cyanosis, no clubbing Pulses: 2+ symmetric, upper and lower extremities, normal cap refill Neurological: alert, oriented x 3, CN2-12 intact, strength normal upper extremities and lower extremities,  DTRs 2+ throughout, no cerebellar signs, patient in wheelchair due to SOB Psychiatric: normal affect, behavior normal, pleasant    [name], PA-C 1:12 PM [facility]

## 2017-01-09 ENCOUNTER — Ambulatory Visit (INDEPENDENT_AMBULATORY_CARE_PROVIDER_SITE_OTHER): Payer: PPO | Admitting: Physician Assistant

## 2017-01-09 ENCOUNTER — Encounter: Payer: Self-pay | Admitting: Physician Assistant

## 2017-01-09 ENCOUNTER — Ambulatory Visit: Payer: Self-pay | Admitting: Internal Medicine

## 2017-01-09 VITALS — BP 130/74 | HR 82 | Temp 97.4°F | Resp 14 | Ht 67.0 in | Wt 246.8 lb

## 2017-01-09 DIAGNOSIS — E559 Vitamin D deficiency, unspecified: Secondary | ICD-10-CM

## 2017-01-09 DIAGNOSIS — E1122 Type 2 diabetes mellitus with diabetic chronic kidney disease: Secondary | ICD-10-CM

## 2017-01-09 DIAGNOSIS — J441 Chronic obstructive pulmonary disease with (acute) exacerbation: Secondary | ICD-10-CM | POA: Diagnosis not present

## 2017-01-09 DIAGNOSIS — I4892 Unspecified atrial flutter: Secondary | ICD-10-CM | POA: Diagnosis not present

## 2017-01-09 DIAGNOSIS — E038 Other specified hypothyroidism: Secondary | ICD-10-CM | POA: Diagnosis not present

## 2017-01-09 DIAGNOSIS — I272 Pulmonary hypertension, unspecified: Secondary | ICD-10-CM | POA: Diagnosis not present

## 2017-01-09 DIAGNOSIS — I10 Essential (primary) hypertension: Secondary | ICD-10-CM | POA: Diagnosis not present

## 2017-01-09 DIAGNOSIS — E7800 Pure hypercholesterolemia, unspecified: Secondary | ICD-10-CM

## 2017-01-09 DIAGNOSIS — J9621 Acute and chronic respiratory failure with hypoxia: Secondary | ICD-10-CM | POA: Diagnosis not present

## 2017-01-09 DIAGNOSIS — J449 Chronic obstructive pulmonary disease, unspecified: Secondary | ICD-10-CM | POA: Diagnosis not present

## 2017-01-09 DIAGNOSIS — E1169 Type 2 diabetes mellitus with other specified complication: Secondary | ICD-10-CM

## 2017-01-09 DIAGNOSIS — Z79899 Other long term (current) drug therapy: Secondary | ICD-10-CM | POA: Diagnosis not present

## 2017-01-09 DIAGNOSIS — J9610 Chronic respiratory failure, unspecified whether with hypoxia or hypercapnia: Secondary | ICD-10-CM | POA: Diagnosis not present

## 2017-01-09 DIAGNOSIS — I5033 Acute on chronic diastolic (congestive) heart failure: Secondary | ICD-10-CM

## 2017-01-09 DIAGNOSIS — E669 Obesity, unspecified: Secondary | ICD-10-CM

## 2017-01-09 DIAGNOSIS — I471 Supraventricular tachycardia: Secondary | ICD-10-CM

## 2017-01-09 DIAGNOSIS — N183 Chronic kidney disease, stage 3 (moderate): Secondary | ICD-10-CM

## 2017-01-09 LAB — BASIC METABOLIC PANEL WITH GFR
BUN: 22 mg/dL (ref 7–25)
CALCIUM: 9.3 mg/dL (ref 8.6–10.4)
CHLORIDE: 102 mmol/L (ref 98–110)
CO2: 28 mmol/L (ref 20–31)
CREATININE: 0.77 mg/dL (ref 0.60–0.93)
GFR, EST NON AFRICAN AMERICAN: 78 mL/min (ref >=60)
GFR, Est African American: >89 mL/min (ref >=60)
Glucose, Bld: 132 mg/dL — ABNORMAL HIGH (ref 65–99)
Potassium: 4.7 mmol/L (ref 3.5–5.3)
SODIUM: 142 mmol/L (ref 135–146)

## 2017-01-09 LAB — CBC WITH DIFFERENTIAL/PLATELET
BASOS ABS: 0 {cells}/uL (ref 0–200)
Basophils Relative: 0 %
EOS PCT: 1 %
Eosinophils Absolute: 59 cells/uL (ref 15–500)
HCT: 37.1 % (ref 35.0–45.0)
Hemoglobin: 10.9 g/dL — ABNORMAL LOW (ref 11.7–15.5)
LYMPHS ABS: 1180 {cells}/uL (ref 850–3900)
Lymphocytes Relative: 20 %
MCH: 23.5 pg — AB (ref 27.0–33.0)
MCHC: 29.4 g/dL — ABNORMAL LOW (ref 32.0–36.0)
MCV: 80.1 fL (ref 80.0–100.0)
MONOS PCT: 7 %
MPV: 9.1 fL (ref 7.5–12.5)
Monocytes Absolute: 413 cells/uL (ref 200–950)
NEUTROS ABS: 4248 {cells}/uL (ref 1500–7800)
Neutrophils Relative %: 72 %
PLATELETS: 288 10*3/uL (ref 140–400)
RBC: 4.63 MIL/uL (ref 3.80–5.10)
RDW: 18.9 % — ABNORMAL HIGH (ref 11.0–15.0)
WBC: 5.9 10*3/uL (ref 3.8–10.8)

## 2017-01-09 LAB — HEPATIC FUNCTION PANEL
ALT: 7 U/L (ref 6–29)
AST: 10 U/L (ref 10–35)
Albumin: 3.7 g/dL (ref 3.6–5.1)
Alkaline Phosphatase: 88 U/L (ref 33–130)
BILIRUBIN DIRECT: 0.1 mg/dL (ref <=0.2)
BILIRUBIN TOTAL: 0.4 mg/dL (ref 0.2–1.2)
Indirect Bilirubin: 0.3 mg/dL (ref 0.2–1.2)
Total Protein: 6.3 g/dL (ref 6.1–8.1)

## 2017-01-09 LAB — LIPID PANEL
Cholesterol: 165 mg/dL (ref <200)
HDL: 60 mg/dL (ref >50)
LDL CALC: 78 mg/dL (ref <100)
TRIGLYCERIDES: 133 mg/dL (ref <150)
Total CHOL/HDL Ratio: 2.8 Ratio (ref <5.0)
VLDL: 27 mg/dL (ref <30)

## 2017-01-09 NOTE — Patient Instructions (Signed)
You can take tylenol (500mg ) or tylenol arthritis (650mg ) with the meloxicam/antiinflammatories. The max you can take of tylenol a day is 3000mg  daily, this is a max of 6 pills a day of the regular tyelnol (500mg ) or a max of 4 a day of the tylenol arthritis (650mg ) as long as no other medications you are taking contain tylenol.   Your PT/INR is the test we use to check your coumadin level.  Your goal for your INR is between 2-3.  If you number is below 2 than your blood is thick and you need more coumadin. You are at risk for stroke or clotting during this time period.  If you number is above 3 than your blood is too thin and you need less coumadin or can eat more greens at this time. You are at risk for stomach or head bleeds and need to be careful.

## 2017-01-10 ENCOUNTER — Encounter: Payer: Self-pay | Admitting: Pulmonary Disease

## 2017-01-10 ENCOUNTER — Ambulatory Visit (INDEPENDENT_AMBULATORY_CARE_PROVIDER_SITE_OTHER): Payer: PPO | Admitting: Pulmonary Disease

## 2017-01-10 ENCOUNTER — Encounter: Payer: Self-pay | Admitting: Physician Assistant

## 2017-01-10 ENCOUNTER — Other Ambulatory Visit: Payer: Self-pay | Admitting: Physician Assistant

## 2017-01-10 VITALS — BP 110/70 | HR 90 | Ht 66.0 in | Wt 247.2 lb

## 2017-01-10 DIAGNOSIS — J9612 Chronic respiratory failure with hypercapnia: Secondary | ICD-10-CM | POA: Diagnosis not present

## 2017-01-10 DIAGNOSIS — J411 Mucopurulent chronic bronchitis: Secondary | ICD-10-CM | POA: Diagnosis not present

## 2017-01-10 DIAGNOSIS — J9611 Chronic respiratory failure with hypoxia: Secondary | ICD-10-CM

## 2017-01-10 DIAGNOSIS — Z79899 Other long term (current) drug therapy: Secondary | ICD-10-CM

## 2017-01-10 LAB — PROTIME-INR
INR: 1.1
Prothrombin Time: 11.8 s — ABNORMAL HIGH (ref 9.0–11.5)

## 2017-01-10 LAB — TSH: TSH: 0.55 m[IU]/L

## 2017-01-10 LAB — MAGNESIUM: MAGNESIUM: 1.6 mg/dL (ref 1.5–2.5)

## 2017-01-10 MED ORDER — BUDESONIDE-FORMOTEROL FUMARATE 160-4.5 MCG/ACT IN AERO: 2.0000 | INHALATION_SPRAY | Freq: Two times a day (BID) | RESPIRATORY_TRACT | 0 refills | Status: DC

## 2017-01-10 NOTE — Progress Notes (Signed)
   Subjective:    Patient ID: Jillian Wood, female    DOB: [birth year], 71 y.o.   MRN: [id number]  HPI    Review of Systems  Constitutional: Positive for unexpected weight change ( trying to lose weight). Negative for fever.  HENT: Negative for congestion, dental problem, ear pain, nosebleeds, postnasal drip, rhinorrhea, sinus pressure, sneezing, sore throat and trouble swallowing.   Eyes: Negative for redness and itching.  Respiratory: Positive for cough and shortness of breath. Negative for chest tightness and wheezing.   Cardiovascular: Negative for palpitations and leg swelling.  Gastrointestinal: Negative for nausea and vomiting.  Genitourinary: Negative for dysuria.  Musculoskeletal: Negative for joint swelling.  Skin: Negative for rash.  Neurological: Negative for headaches.  Hematological: Does not bruise/bleed easily.  Psychiatric/Behavioral: Negative for dysphoric mood. The patient is not nervous/anxious.        Objective:   Physical Exam        Assessment & Plan:

## 2017-01-10 NOTE — Progress Notes (Signed)
Past surgical history She  has a past surgical history that includes Carpal tunnel release; Eye surgery (Bilateral); Pubovaginal sling; Tonsillectomy and adenoidectomy; transthoracic echocardiogram (01/2011); NM MYOVIEW LTD (01/2011); and TEE without cardioversion (N/A, 12/16/2016).  Family history Her family history includes Alcohol abuse in her father; Heart disease in her father.  Social history She  reports that she quit smoking about 4 months ago. Her smoking use included Cigarettes. She has a 22.00 pack-year smoking history. She has never used smokeless tobacco. She reports that she does not drink alcohol or use drugs.  Allergies  Allergen Reactions  . Inderal [Propranolol] Other (See Comments)    Hair loss  . Ace Inhibitors Cough    Review of Systems  Constitutional: Positive for unexpected weight change ( trying to lose weight). Negative for fever.  HENT: Negative for congestion, dental problem, ear pain, nosebleeds, postnasal drip, rhinorrhea, sinus pressure, sneezing, sore throat and trouble swallowing.   Eyes: Negative for redness and itching.  Respiratory: Positive for cough and shortness of breath. Negative for chest tightness and wheezing.   Cardiovascular: Negative for palpitations and leg swelling.  Gastrointestinal: Negative for nausea and vomiting.  Genitourinary: Negative for dysuria.  Musculoskeletal: Negative for joint swelling.  Skin: Negative for rash.  Neurological: Negative for headaches.  Hematological: Does not bruise/bleed easily.  Psychiatric/Behavioral: Negative for dysphoric mood. The patient is not nervous/anxious.     Current Outpatient Prescriptions on File Prior to Visit  Medication Sig  . acetaminophen-codeine (TYLENOL #3) 300-30 MG tablet Take 1 tablet by mouth every 8 (eight) hours as needed for moderate pain or severe pain.  Marland Kitchen acetaZOLAMIDE (DIAMOX) 250 MG tablet Take 1 tablet (250 mg total) by mouth 2 (two) times daily.  Marland Kitchen albuterol (PROVENTIL  HFA;VENTOLIN HFA) 108 (90 Base) MCG/ACT inhaler Inhale 1-2 puffs into the lungs every 6 (six) hours as needed for wheezing or shortness of breath.  Marland Kitchen aspirin 81 MG tablet Take 81 mg by mouth daily.  . bisoprolol (ZEBETA) 10 MG tablet Take 1 tablet (10 mg total) by mouth daily.  . budesonide-formoterol (SYMBICORT) 160-4.5 MCG/ACT inhaler 2 puffs twice a day, wash mouth afterwards (Patient taking differently: Inhale 2 puffs into the lungs 2 (two) times daily. )  . Cholecalciferol (VITAMIN D PO) Take 2,000 Units by mouth daily.   Marland Kitchen diltiazem (CARDIZEM CD) 300 MG 24 hr capsule TAKE 1 CAPSULE (300 MG TOTAL) BY MOUTH DAILY.  . DULoxetine (CYMBALTA) 60 MG capsule TAKE 1 CAPSULE BY MOUTH DAILY  . famotidine (PEPCID) 20 MG tablet TAKE 1 TABLET (20 MG TOTAL) BY MOUTH 2 (TWO) TIMES DAILY. FOR ACID REFLUX  . ferrous sulfate 325 (65 FE) MG tablet Take 325 mg by mouth 2 (two) times daily with a meal.  . fluticasone (FLONASE) 50 MCG/ACT nasal spray Place 2 sprays into both nostrils daily. (Patient taking differently: Place 2 sprays into both nostrils daily as needed for allergies. )  . furosemide (LASIX) 80 MG tablet TAKE 1 TABLET (80 MG TOTAL) BY MOUTH DAILY.  Marland Kitchen guaiFENesin (MUCINEX) 600 MG 12 hr tablet Take 600 mg by mouth 2 (two) times daily.   Marland Kitchen levothyroxine (SYNTHROID, LEVOTHROID) 175 MCG tablet TAKE 1 TABLET (175 MCG TOTAL) BY MOUTH DAILY BEFORE BREAKFAST.  Marland Kitchen loratadine-pseudoephedrine (CLARITIN-D 24-HOUR) 10-240 MG per 24 hr tablet Take 1 tablet by mouth as needed for allergies.   Marland Kitchen losartan (COZAAR) 50 MG tablet TAKE 1 TABLET BY MOUTH DAILY  . Magnesium Chloride (MAGNESIUM DR PO) Take 1 capsule by  mouth daily.   . metFORMIN (GLUCOPHAGE-XR) 500 MG 24 hr tablet TAKE 1 TABLET (500 MG TOTAL) BY MOUTH 4 (FOUR) TIMES DAILY - AFTER MEALS AND AT BEDTIME.  Marland Kitchen OXYGEN-HELIUM IN Inhale 5-6 L into the lungs continuous.   . pravastatin (PRAVACHOL) 40 MG tablet TAKE 1 TABLET (40 MG TOTAL) BY MOUTH DAILY.  Marland Kitchen warfarin  (COUMADIN) 5 MG tablet Take 1 to 1.5 tablets daily or as directed. (Patient taking differently: Take 5-7.5 mg by mouth daily at 6 PM. TAKES 7.5MG  ON TUES AND THURS TAKES 5MG  ALL OTHER DAYS)   No current facility-administered medications on file prior to visit.     Chief Complaint  Patient presents with  . PULMONARY CONSULT    Referred for COPD. Current use 5 liters O2 at rest, up to 8 liters with exertion. NIV nightly with O2    Pulmonary tests Spirometry 01/10/17 >> FEV1 0.92 (37%), FEV1% 77  Cardiac tests Echo 12/11/16 >> EF 65 to 70%, mass on mitral valve, mod TR, PAS 61 mmHg  Past medical history She  has a past medical history of Arthritis; CHF (congestive heart failure) ([place]); COPD (chronic obstructive pulmonary disease) ([place]); Depression; Diabetes mellitus type 2 in obese [facility]); GERD (gastroesophageal reflux disease); Hyperlipidemia; Hypertension; Morbid obesity ([place]); OAB (overactive bladder); PSVT (paroxysmal supraventricular tachycardia) ([place]); Thyroid disease; and Vitamin D deficiency.  Vital signs BP 110/70 (BP Location: Right Arm, Cuff Size: Normal)   Pulse 90   Ht 5\' 6"  (1.676 m)   Wt 247 lb 3.2 oz (112.1 kg)   SpO2 91%   BMI 39.90 kg/m   History of present illness Jillian Wood is a 71 y.o. female former smoker for evaluation of COPD.  She smoked 1 to 1.5 pack per day.  She quit in 2017.  She was told she has COPD, but doesn't recall having PFT before.  She uses 4 to 6 liters oxygen.  She has been using Bipap since last year when she was in hospital.  She is set up for sleep study later next month.  She has occasional cough with clear sputum.  She gets intermittent wheezing.  She denies chest pain, fever, or hemoptysis.  Her leg swelling is better.  She has been doing better since recent hospitalization.    She is limited in her activity by joint pain > breathing.  She uses a wheelchair.  She had pneumonia previously.  No history of tuberculosis.    Physical  exam  General - pleasant, sitting in wheelchair, wearing oxygen ENT - No sinus tenderness, no oral exudate, no LAN, no thyromegaly, TM clear, pupils equal/reactive Cardiac - s1s2 regular, no murmur, pulses symmetric Chest - decreased BS, no wheezing Back - No focal tenderness Abd - Soft, non-tender, no organomegaly, + bowel sounds Ext - No edema Neuro - Normal strength, cranial nerves intact Skin - No rashes Psych - Normal mood, and behavior   CMP Latest Ref Rng & Units 01/09/2017 12/26/2016 12/12/2016  Glucose 65 - 99 mg/dL 132(H) 139(H) 232(H)  BUN 7 - 25 mg/dL 22 26(H) 38(H)  Creatinine 0.60 - 0.93 mg/dL 0.77 0.75 0.98  Sodium 135 - 146 mmol/L 142 140 137  Potassium 3.5 - 5.3 mmol/L 4.7 3.9 4.4  Chloride 98 - 110 mmol/L 102 98 92(L)  CO2 20 - 31 mmol/L 28 32(H) 35(H)  Calcium 8.6 - 10.4 mg/dL 9.3 9.0 9.6  Total Protein 6.1 - 8.1 g/dL 6.3 6.1 -  Total Bilirubin 0.2 - 1.2 mg/dL 0.4  0.2 -  Alkaline Phos 33 - 130 U/L 88 75 -  AST 10 - 35 U/L 10 13 -  ALT 6 - 29 U/L 7 10 -     CBC Latest Ref Rng & Units 01/09/2017 12/26/2016 12/09/2016  WBC 3.8 - 10.8 K/uL 5.9 7.8 10.2  Hemoglobin 11.7 - 15.5 g/dL 10.9(L) 11.2(L) 10.1(L)  Hematocrit 35.0 - 45.0 % 37.1 39.0 36.2  Platelets 140 - 400 K/uL 288 231 241     ABG    Component Value Date/Time   PHART 7.381 08/02/2016 0343   PCO2ART 83.8 (HH) 08/02/2016 0343   PO2ART 73.9 (L) 08/02/2016 0343   HCO3 48.6 (H) 08/02/2016 0343   TCO2 >50 07/31/2016 2019   O2SAT 92.7 08/02/2016 0343     Dg Chest Portable 1 View  Result Date: 12/09/2016 CLINICAL DATA:  71 year old female with shortness of breath and cough for 2-3 days. Initial encounter. EXAM: PORTABLE CHEST 1 VIEW COMPARISON:  11/11/2016 07/31/2016. FINDINGS: Pulmonary edema superimposed upon chronic changes. Right-sided pleural effusion suspected. Basilar atelectasis greater on the right. Cannot exclude basilar infiltrate or mass. Cardiomegaly. Calcified aorta. Shoulder joint  degenerative change greater on left. IMPRESSION: Pulmonary edema superimposed upon chronic changes. Right-sided pleural effusion suspected. Basilar atelectasis greater on the right. Cannot exclude basilar infiltrate or mass. Cardiomegaly. Electronically Signed   By: Genia Del M.D.   On: 12/09/2016 18:13    Discussion She has extensive prior history of smoking.  She has history of COPD with chronic bronchitis.  She has chronic hypoxic/hypercapnic respiratory failure and has been on supplemental oxygen and Bipap.  Her symptoms have improved since most recent hospitalization.  Assessment/plan  COPD with chronic bronchitis. - continue symbicort with prn albuterol - PCP is trying to change to nebulized ICS/LABA - she will need booster for pneumovax within the next year  Chronic hypoxic,hypercapnic respiratory failure. - continue oxygen 4 to 6 liters 24/7 - Bipap qhs and prn - will try to get copy of Bipap report - she has sleep study scheduled for 02/06/17  WHO group 2 and 3 pulmonary hypertension. - continue to optimize secondary causes of this    Patient Instructions  Spirometry today  Will get copy of Bipap report from [place]  Follow up in 4 months    Chesley Mires, MD [facility] Pager:  [phone] 01/10/2017, 3:19 PM

## 2017-01-10 NOTE — Patient Instructions (Signed)
Spirometry today  Will get copy of Bipap report from [place]  Follow up in 4 months

## 2017-01-10 NOTE — Addendum Note (Signed)
Addended by: Virl Cagey on: 01/10/2017 05:23 PM   Modules accepted: Orders

## 2017-01-12 DIAGNOSIS — J449 Chronic obstructive pulmonary disease, unspecified: Secondary | ICD-10-CM | POA: Diagnosis not present

## 2017-01-15 MED ORDER — BUDESONIDE 0.5 MG/2ML IN SUSP: 0.5000 mg | Freq: Two times a day (BID) | RESPIRATORY_TRACT | 12 refills | Status: DC

## 2017-01-15 MED ORDER — IPRATROPIUM-ALBUTEROL 0.5-2.5 (3) MG/3ML IN SOLN: 3.0000 mL | Freq: Four times a day (QID) | RESPIRATORY_TRACT | 3 refills | Status: DC | PRN

## 2017-01-15 NOTE — Addendum Note (Signed)
Addended by: [name] R on: 01/15/2017 01:24 PM   Modules accepted: Orders

## 2017-01-16 DIAGNOSIS — J449 Chronic obstructive pulmonary disease, unspecified: Secondary | ICD-10-CM | POA: Diagnosis not present

## 2017-01-17 ENCOUNTER — Encounter: Payer: Self-pay | Admitting: Cardiovascular Disease

## 2017-01-17 ENCOUNTER — Ambulatory Visit (INDEPENDENT_AMBULATORY_CARE_PROVIDER_SITE_OTHER): Payer: PPO | Admitting: Cardiovascular Disease

## 2017-01-17 VITALS — BP 110/64 | HR 106 | Ht 66.0 in | Wt 246.0 lb

## 2017-01-17 DIAGNOSIS — E7800 Pure hypercholesterolemia, unspecified: Secondary | ICD-10-CM

## 2017-01-17 DIAGNOSIS — I5033 Acute on chronic diastolic (congestive) heart failure: Secondary | ICD-10-CM | POA: Diagnosis not present

## 2017-01-17 DIAGNOSIS — I10 Essential (primary) hypertension: Secondary | ICD-10-CM

## 2017-01-17 DIAGNOSIS — Z720 Tobacco use: Secondary | ICD-10-CM

## 2017-01-17 DIAGNOSIS — I4892 Unspecified atrial flutter: Secondary | ICD-10-CM

## 2017-01-17 NOTE — Progress Notes (Signed)
01/17/2017 Margot Ables   [birth year]  [id number]  Primary Physician MCKEOWN,WILLIAM DAVID, MD Primary Cardiologist: Lorretta Harp MD Renae Gloss  HPI:  Ms. Ishii is a very pleasant 71 year old severely overweight married Caucasian female mother of 3 children, grandmother of 2 grandchildren is accompanied by her husband was also a patient of mine and son today.. I last saw her in the office 12/06/16  She was referred by Dr. Melford Aase for cardiovascular evaluation because of an episode of prolonged chest pressure.Her cardiovascular risk factor profile is notable for 50 pack years of tobacco abuse currently smoking 3-5 cigarettes a day down from one pack per day.. She has treated hypertension, hyperlipidemia and non-insulin-requiring diabetes. There is no family history of heart disease. She has never had a heart attack stroke. 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 Since I saw her a year ago she denies chest pain. She has chronic shortness of breath on home O2. A Myoview stress test and 2-D echo performed last year were unremarkable. She has developed SVT which have responded to vagal maneuvers. She has seen Dr. Rayann Heman for this in the past. She has had multiple admissions for COPD exacerbation, diastolic heart failure and A. fib with RVR most recently 12/09/16 through 12/16/16 She was diuresed and rate controlled.   Current Outpatient Prescriptions  Medication Sig Dispense Refill  . acetaminophen-codeine (TYLENOL #3) 300-30 MG tablet Take 1 tablet by mouth every 8 (eight) hours as needed for moderate pain or severe pain. 90 tablet 0  . acetaZOLAMIDE (DIAMOX) 250 MG tablet Take 1 tablet (250 mg total) by mouth 2 (two) times daily. 30 tablet 0  . albuterol (PROVENTIL HFA;VENTOLIN HFA) 108 (90 Base) MCG/ACT inhaler Inhale 1-2 puffs into the lungs every 6 (six) hours as needed for wheezing or shortness of breath. 18 g 2  . aspirin 81 MG tablet Take 81 mg by mouth  daily.    . bisoprolol (ZEBETA) 10 MG tablet Take 1 tablet (10 mg total) by mouth daily. 90 tablet 1  . budesonide (PULMICORT) 0.5 MG/2ML nebulizer solution Take 2 mLs (0.5 mg total) by nebulization 2 (two) times daily. 120 mL 12  . budesonide-formoterol (SYMBICORT) 160-4.5 MCG/ACT inhaler 2 puffs twice a day, wash mouth afterwards (Patient taking differently: Inhale 2 puffs into the lungs 2 (two) times daily. ) 1 Inhaler 0  . budesonide-formoterol (SYMBICORT) 160-4.5 MCG/ACT inhaler Inhale 2 puffs into the lungs 2 (two) times daily. 1 Inhaler 0  . Cholecalciferol (VITAMIN D PO) Take 2,000 Units by mouth daily.     Marland Kitchen diltiazem (CARDIZEM CD) 300 MG 24 hr capsule TAKE 1 CAPSULE (300 MG TOTAL) BY MOUTH DAILY. 90 capsule 1  . DULoxetine (CYMBALTA) 60 MG capsule TAKE 1 CAPSULE BY MOUTH DAILY 90 capsule 1  . famotidine (PEPCID) 20 MG tablet TAKE 1 TABLET (20 MG TOTAL) BY MOUTH 2 (TWO) TIMES DAILY. FOR ACID REFLUX 180 tablet 1  . ferrous sulfate 325 (65 FE) MG tablet Take 325 mg by mouth 2 (two) times daily with a meal.    . fluticasone (FLONASE) 50 MCG/ACT nasal spray Place 2 sprays into both nostrils daily. (Patient taking differently: Place 2 sprays into both nostrils daily as needed for allergies. ) 16 g 0  . furosemide (LASIX) 80 MG tablet TAKE 1 TABLET (80 MG TOTAL) BY MOUTH DAILY. 90 tablet 0  . guaiFENesin (MUCINEX) 600 MG 12 hr tablet Take 600 mg by mouth 2 (two)  times daily.     Marland Kitchen ipratropium-albuterol (DUONEB) 0.5-2.5 (3) MG/3ML SOLN Take 3 mLs by nebulization every 6 (six) hours as needed. 360 mL 3  . levothyroxine (SYNTHROID, LEVOTHROID) 175 MCG tablet TAKE 1 TABLET (175 MCG TOTAL) BY MOUTH DAILY BEFORE BREAKFAST. 90 tablet 1  . loratadine-pseudoephedrine (CLARITIN-D 24-HOUR) 10-240 MG per 24 hr tablet Take 1 tablet by mouth as needed for allergies.     Marland Kitchen losartan (COZAAR) 50 MG tablet TAKE 1 TABLET BY MOUTH DAILY 90 tablet 2  . Magnesium Chloride (MAGNESIUM DR PO) Take 1 capsule by mouth  daily.     . metFORMIN (GLUCOPHAGE-XR) 500 MG 24 hr tablet TAKE 1 TABLET (500 MG TOTAL) BY MOUTH 4 (FOUR) TIMES DAILY - AFTER MEALS AND AT BEDTIME. 360 tablet 1  . OXYGEN-HELIUM IN Inhale 5-6 L into the lungs continuous.     . pravastatin (PRAVACHOL) 40 MG tablet TAKE 1 TABLET (40 MG TOTAL) BY MOUTH DAILY. 90 tablet 1  . warfarin (COUMADIN) 5 MG tablet Take 1 to 1.5 tablets daily or as directed. (Patient taking differently: Take 5-7.5 mg by mouth daily at 6 PM. TAKES 7.5MG  ON TUES AND THURS TAKES 5MG  ALL OTHER DAYS) 90 tablet 1   No current facility-administered medications for this visit.     Allergies  Allergen Reactions  . Inderal [Propranolol] Other (See Comments)    Hair loss  . Ace Inhibitors Cough    Social History   Social History  . Marital status: Married    Spouse name: N/A  . Number of children: N/A  . Years of education: N/A   Occupational History  . retired    Social History Main Topics  . Smoking status: Former Smoker    Packs/day: 0.50    Years: 44.00    Types: Cigarettes    Quit date: 08/15/2016  . Smokeless tobacco: Never Used     Comment: smoke about 5-6 cigarette daily  . Alcohol use No  . Drug use: No  . Sexual activity: Not on file   Other Topics Concern  . Not on file   Social History Narrative  . No narrative on file     Review of Systems: General: negative for chills, fever, night sweats or weight changes.  Cardiovascular: negative for chest pain, dyspnea on exertion, edema, orthopnea, palpitations, paroxysmal nocturnal dyspnea or shortness of breath Dermatological: negative for rash Respiratory: negative for cough or wheezing Urologic: negative for hematuria Abdominal: negative for nausea, vomiting, diarrhea, bright red blood per rectum, melena, or hematemesis Neurologic: negative for visual changes, syncope, or dizziness All other systems reviewed and are otherwise negative except as noted above.    Blood pressure 110/64, pulse (!)  106, height 5\' 6"  (1.676 m), weight 246 lb (111.6 kg).  General appearance: alert and no distress Neck: no adenopathy, no carotid bruit, no JVD, supple, symmetrical, trachea midline and thyroid not enlarged, symmetric, no tenderness/mass/nodules Lungs: clear to auscultation bilaterally Heart: irregularly irregular rhythm Extremities: extremities normal, atraumatic, no cyanosis or edema  EKG atrial fibrillation with a ventricular response of 106, right bundle branch block. I personally reviewed this EKG  ASSESSMENT AND PLAN:   Essential hypertension History of hypertension with blood pressure measured 110/64. She is on Zebeta, diltiazem and losartan. Continue current meds at current dosing  Hyperlipidemia History of hyperlipidemia on statin therapy with recent lipid profile performed 01/09/17 revealing total cholesterol 165, LDL 78 and HDL of 60  Tobacco abuse History of remote tobacco abuse having stopped  in 2007.  Acute on chronic diastolic CHF (congestive heart failure) (HCC) History of diastolic heart failure on Diamox and Lasix. She does have normal LV function. She is aware of salt restriction.  Atrial flutter with rapid ventricular response (HCC) History of atrial fibrillation rate controlled on Coumadin anticoagulation.      Lorretta Harp MD FACP,FACC,FAHA, [facility] 01/17/2017 2:19 PM

## 2017-01-17 NOTE — Assessment & Plan Note (Signed)
History of hyperlipidemia on statin therapy with recent lipid profile performed 01/09/17 revealing total cholesterol 165, LDL 78 and HDL of 60

## 2017-01-17 NOTE — Patient Instructions (Signed)
Medication Instructions: Your physician recommends that you continue on your current medications as directed. Please refer to the Current Medication list given to you today.   Follow-Up: We request that you follow-up in: 3 months with Rhonda Barrett, PA and in 6 months with Dr Berry  You will receive a reminder letter in the mail two months in advance. If you don't receive a letter, please call our office to schedule the follow-up appointment.  If you need a refill on your cardiac medications before your next appointment, please call your pharmacy.  

## 2017-01-17 NOTE — Assessment & Plan Note (Signed)
History of diastolic heart failure on Diamox and Lasix. She does have normal LV function. She is aware of salt restriction.

## 2017-01-17 NOTE — Assessment & Plan Note (Signed)
History of atrial fibrillation rate controlled on Coumadin anticoagulation. 

## 2017-01-17 NOTE — Assessment & Plan Note (Signed)
History of remote tobacco abuse having stopped in 2007.

## 2017-01-17 NOTE — Assessment & Plan Note (Signed)
History of hypertension with blood pressure measured 110/64. She is on Zebeta, diltiazem and losartan. Continue current meds at current dosing

## 2017-01-27 ENCOUNTER — Ambulatory Visit (INDEPENDENT_AMBULATORY_CARE_PROVIDER_SITE_OTHER): Payer: PPO | Admitting: *Deleted

## 2017-01-27 ENCOUNTER — Ambulatory Visit: Payer: PPO | Admitting: Physician Assistant

## 2017-01-27 DIAGNOSIS — Z79899 Other long term (current) drug therapy: Secondary | ICD-10-CM | POA: Diagnosis not present

## 2017-01-27 LAB — CBC WITH DIFFERENTIAL/PLATELET
Basophils Absolute: 0 cells/uL (ref 0–200)
Basophils Relative: 0 %
Eosinophils Absolute: 76 cells/uL (ref 15–500)
Eosinophils Relative: 1 %
HEMATOCRIT: 36.7 % (ref 35.0–45.0)
Hemoglobin: 10.9 g/dL — ABNORMAL LOW (ref 11.7–15.5)
LYMPHS PCT: 14 %
Lymphs Abs: 1064 cells/uL (ref 850–3900)
MCH: 24.1 pg — ABNORMAL LOW (ref 27.0–33.0)
MCHC: 29.7 g/dL — AB (ref 32.0–36.0)
MCV: 81.2 fL (ref 80.0–100.0)
MONO ABS: 532 {cells}/uL (ref 200–950)
MPV: 9.1 fL (ref 7.5–12.5)
Monocytes Relative: 7 %
NEUTROS PCT: 78 %
Neutro Abs: 5928 cells/uL (ref 1500–7800)
Platelets: 295 10*3/uL (ref 140–400)
RBC: 4.52 MIL/uL (ref 3.80–5.10)
RDW: 18.1 % — AB (ref 11.0–15.0)
WBC: 7.6 10*3/uL (ref 3.8–10.8)

## 2017-01-27 NOTE — Progress Notes (Signed)
Patient here for  A NV to chec a CBC and Protime.  The patient is taking Coumadin 5 mg tablet 1 tablet on Monday, Wednesday and Friday.  She takes 1.5 tablets the other 4 days of the week.

## 2017-01-28 ENCOUNTER — Ambulatory Visit: Payer: Self-pay

## 2017-01-28 LAB — PROTIME-INR
INR: 5.2 — AB
Prothrombin Time: 51.6 s — ABNORMAL HIGH (ref 9.0–11.5)

## 2017-01-28 NOTE — Progress Notes (Signed)
Pt aware of lab results & voiced understanding of those results.

## 2017-02-06 ENCOUNTER — Encounter (HOSPITAL_BASED_OUTPATIENT_CLINIC_OR_DEPARTMENT_OTHER): Payer: PPO

## 2017-02-08 DIAGNOSIS — J449 Chronic obstructive pulmonary disease, unspecified: Secondary | ICD-10-CM | POA: Diagnosis not present

## 2017-02-08 DIAGNOSIS — J9610 Chronic respiratory failure, unspecified whether with hypoxia or hypercapnia: Secondary | ICD-10-CM | POA: Diagnosis not present

## 2017-02-09 NOTE — Patient Instructions (Addendum)

## 2017-02-09 NOTE — Progress Notes (Signed)
This very nice 71 y.o. MWF presents for 3 month follow up with Hypertension, Hyperlipidemia, ASHD/pAfib, ES COPD/OSA/CPAP, Morbid Obesity, T2_NIDDM, Hypothyroidism  and Vitamin D Deficiency. Patient also has GERD controlled on her meds.      Patient is treated for HTN (1980's) & BP has been controlled at home. Today's BP is at goal - 114/64. She was dx'd with pAfib in Nov 2017 and has since been on Coumadin. Patient has had no complaints of any cardiac type chest pain, palpitations, dizziness, claudication or dependent edema. Patient's O2 Dependent  COPD is End Stage & CO2 retentive on Diamox targeting to keep O2's in the 90-92 range to avoid CO2 narcosis.     Hyperlipidemia is controlled with diet & Pravastatin. Patient denies myalgias or other med SE's. Last Lipids were at goal: Lab Results  Component Value Date   CHOL 165 01/09/2017   HDL 60 01/09/2017   LDLCALC 78 01/09/2017   TRIG 133 01/09/2017   CHOLHDL 2.8 01/09/2017      Also, the patient has history of Morbid Obesity (BMI 44+) and T2_NIDDM (2011)  w/CKD3 and has had no symptoms of reactive hypoglycemia, diabetic polys, paresthesias or visual blurring.  Last A1c was at goal on Metformin: Lab Results  Component Value Date   HGBA1C 5.2 06/06/2016      Patient has compensated Hypothyroidism.  Further, the patient also has history of Vitamin D Deficiency ("9" in 2009) and supplements vitamin D without any suspected side-effects. Last vitamin D was   Lab Results  Component Value Date   VD25OH 45 02/29/2016   Current Outpatient Prescriptions on File Prior to Visit  Medication Sig  . acetaminophen-codeine (TYLENOL #3) 300-30 MG tablet Take 1 tablet by mouth every 8 (eight) hours as needed for moderate pain or severe pain.  Marland Kitchen acetaZOLAMIDE (DIAMOX) 250 MG tablet Take 1 tablet (250 mg total) by mouth 2 (two) times daily.  Marland Kitchen albuterol (PROVENTIL HFA;VENTOLIN HFA) 108 (90 Base) MCG/ACT inhaler Inhale 1-2 puffs into the lungs every 6  (six) hours as needed for wheezing or shortness of breath.  Marland Kitchen aspirin 81 MG tablet Take 81 mg by mouth daily.  . bisoprolol (ZEBETA) 10 MG tablet Take 1 tablet (10 mg total) by mouth daily.  . budesonide (PULMICORT) 0.5 MG/2ML nebulizer solution Take 2 mLs (0.5 mg total) by nebulization 2 (two) times daily.  . budesonide-formoterol (SYMBICORT) 160-4.5 MCG/ACT inhaler 2 puffs twice a day, wash mouth afterwards (Patient taking differently: Inhale 2 puffs into the lungs 2 (two) times daily. )  . budesonide-formoterol (SYMBICORT) 160-4.5 MCG/ACT inhaler Inhale 2 puffs into the lungs 2 (two) times daily.  . Cholecalciferol (VITAMIN D PO) Take 2,000 Units by mouth daily.   Marland Kitchen diltiazem (CARDIZEM CD) 300 MG 24 hr capsule TAKE 1 CAPSULE (300 MG TOTAL) BY MOUTH DAILY.  . DULoxetine (CYMBALTA) 60 MG capsule TAKE 1 CAPSULE BY MOUTH DAILY  . famotidine (PEPCID) 20 MG tablet TAKE 1 TABLET (20 MG TOTAL) BY MOUTH 2 (TWO) TIMES DAILY. FOR ACID REFLUX  . ferrous sulfate 325 (65 FE) MG tablet Take 325 mg by mouth 2 (two) times daily with a meal.  . fluticasone (FLONASE) 50 MCG/ACT nasal spray Place 2 sprays into both nostrils daily. (Patient taking differently: Place 2 sprays into both nostrils daily as needed for allergies. )  . furosemide (LASIX) 80 MG tablet TAKE 1 TABLET (80 MG TOTAL) BY MOUTH DAILY.  Marland Kitchen guaiFENesin (MUCINEX) 600 MG 12 hr tablet Take  600 mg by mouth 2 (two) times daily.   Marland Kitchen ipratropium-albuterol (DUONEB) 0.5-2.5 (3) MG/3ML SOLN Take 3 mLs by nebulization every 6 (six) hours as needed.  Marland Kitchen levothyroxine (SYNTHROID, LEVOTHROID) 175 MCG tablet TAKE 1 TABLET (175 MCG TOTAL) BY MOUTH DAILY BEFORE BREAKFAST.  Marland Kitchen loratadine-pseudoephedrine (CLARITIN-D 24-HOUR) 10-240 MG per 24 hr tablet Take 1 tablet by mouth as needed for allergies.   Marland Kitchen losartan (COZAAR) 50 MG tablet TAKE 1 TABLET BY MOUTH DAILY  . Magnesium Chloride (MAGNESIUM DR PO) Take 1 capsule by mouth daily.   . metFORMIN (GLUCOPHAGE-XR) 500 MG 24  hr tablet TAKE 1 TABLET (500 MG TOTAL) BY MOUTH 4 (FOUR) TIMES DAILY - AFTER MEALS AND AT BEDTIME.  Marland Kitchen OXYGEN-HELIUM IN Inhale 5-6 L into the lungs continuous.   . pravastatin (PRAVACHOL) 40 MG tablet TAKE 1 TABLET (40 MG TOTAL) BY MOUTH DAILY.  Marland Kitchen warfarin (COUMADIN) 5 MG tablet Take 1 to 1.5 tablets daily or as directed. (Patient taking differently: Take 5-7.5 mg by mouth daily at 6 PM. TAKES 7.5MG  ON TUES AND THURS TAKES 5MG  ALL OTHER DAYS)   No current facility-administered medications on file prior to visit.    Allergies  Allergen Reactions  . Inderal [Propranolol] Other (See Comments)    Hair loss  . Ace Inhibitors Cough   PMHx:   Past Medical History:  Diagnosis Date  . Arthritis   . CHF (congestive heart failure) ([place])   . COPD (chronic obstructive pulmonary disease) ([place])   . Depression   . Diabetes mellitus type 2 in obese ([place])   . GERD (gastroesophageal reflux disease)   . Hyperlipidemia   . Hypertension   . Morbid obesity ([place])   . OAB (overactive bladder)   . PSVT (paroxysmal supraventricular tachycardia) ([place])   . Thyroid disease   . Vitamin D deficiency    Immunization History  Administered Date(s) Administered  . Influenza, High Dose Seasonal PF 08/16/2014, 07/27/2015, 06/06/2016  . Pneumococcal Conjugate-13 07/27/2015  . Pneumococcal-Unspecified 05/16/2011  . Tdap 08/16/2008   Past Surgical History:  Procedure Laterality Date  . CARPAL TUNNEL RELEASE     L 1992 R 1984  . EYE SURGERY Bilateral    cataract  . NM MYOVIEW LTD  01/2011   Dobutamine Myoview: Negative perfusion scan for ischemia / infarct (poor image capture); Pt developed SVT with Dobutamine that reproduced her CP as it resolved with restoration of NSR.  Marland Kitchen PUBOVAGINAL SLING    . TEE WITHOUT CARDIOVERSION N/A 12/16/2016   Procedure: TRANSESOPHAGEAL ECHOCARDIOGRAM (TEE);  Surgeon: Sanda Klein, MD;  Location: [facility] ENDOSCOPY;  Service: Cardiovascular;  Laterality: N/A;  . TONSILLECTOMY AND  ADENOIDECTOMY    . TRANSTHORACIC ECHOCARDIOGRAM  01/2011   Hyperdynamic LV with EF 65-70%, Gr 1 DD, Mild Ao Stenosis - mean gradient ~19 mmHg   FHx:    Reviewed / unchanged  SHx:    Reviewed / unchanged  Systems Review:  Constitutional: Denies fever, chills, wt changes, headaches, insomnia, fatigue, night sweats, change in appetite. Eyes: Denies redness, blurred vision, diplopia, discharge, itchy, watery eyes.  ENT: Denies discharge, congestion, post nasal drip, epistaxis, sore throat, earache, hearing loss, dental pain, tinnitus, vertigo, sinus pain, snoring.  CV: Denies chest pain, palpitations, irregular heartbeat, syncope, dyspnea, diaphoresis, orthopnea, PND, claudication or edema. Respiratory: denies cough, dyspnea, DOE, pleurisy, hoarseness, laryngitis, wheezing.  Gastrointestinal: Denies dysphagia, odynophagia, heartburn, reflux, water brash, abdominal pain or cramps, nausea, vomiting, bloating, diarrhea, constipation, hematemesis, melena, hematochezia  or hemorrhoids. Genitourinary: Denies dysuria,  frequency, urgency, nocturia, hesitancy, discharge, hematuria or flank pain. Musculoskeletal: Denies arthralgias, myalgias, stiffness, jt. swelling, pain, limping or strain/sprain.  Skin: Denies pruritus, rash, hives, warts, acne, eczema or change in skin lesion(s). Neuro: No weakness, tremor, incoordination, spasms, paresthesia or pain. Psychiatric: Denies confusion, memory loss or sensory loss. Endo: Denies change in weight, skin or hair change.  Heme/Lymph: No excessive bleeding, bruising or enlarged lymph nodes.  Physical Exam  BP 114/64   Pulse 84   Temp 97.3 F (36.3 C)   Resp 20   Ht 5\' 5"  (1.651 m)   Wt 248 lb 6.4 oz (112.7 kg)   BMI 41.34 kg/m   Appears Over nourished, rancid B.O. and in no distress.  Eyes: PERRLA, EOMs, conjunctiva no swelling or erythema. Sinuses: No frontal/maxillary tenderness ENT/Mouth: EAC's clear, TM's nl w/o erythema, bulging. Nares clear w/o  erythema, swelling, exudates. Oropharynx clear without erythema or exudates. Oral hygiene is good. Tongue normal, non obstructing. Hearing intact.  Neck: Supple. Thyroid nl. Car 2+/2+ without bruits, nodes or JVD. Chest: Respirations nl with BS clear & equal w/o rales, rhonchi, wheezing or stridor.  Cor: Heart sounds normal w/ regular rate and rhythm without sig. murmurs, gallops, clicks or rubs. Peripheral pulses normal and equal  without edema.  Abdomen: Soft & bowel sounds normal. Non-tender w/o guarding, rebound, hernias, masses or organomegaly.  Lymphatics: Unremarkable.  Musculoskeletal:  Generalized decrease in muscle power, tone, bulk and patient is in a wheelchair consequent of deconditioning.   Skin: Warm, dry without exposed rashes, lesions or ecchymosis apparent.  Neuro: Cranial nerves intact, reflexes equal bilaterally. Sensory-motor testing grossly intact. Tendon reflexes grossly intact.  Pysch: Alert & oriented x 3.  Insight and judgement nl & appropriate. No ideations.  Assessment and Plan:  1. Essential hypertension  - Continue medication, monitor blood pressure at home.  - Continue DASH diet. Reminder to go to the ER if any CP,  SOB, nausea, dizziness, severe HA, changes vision/speech,  left arm numbness and tingling and jaw pain.  - CBC with Differential/Platelet - BASIC METABOLIC PANEL WITH GFR - Magnesium - TSH  2. Hyperlipidemia, mixed  - Continue diet/meds, exercise,& lifestyle modifications.  - Continue monitor periodic cholesterol/liver & renal functions   - Hepatic function panel - Lipid panel - TSH  3. T2_NIDDM w/CKD3 ([place])  - Continue diet, exercise, lifestyle modifications.  - Monitor appropriate labs.  - Hemoglobin A1c - Insulin, random  4. Vitamin D deficiency  - Continue supplementation.  - VITAMIN D 25 Hydroxy  5. End stage COPD (HCC)   6. Paroxysmal A-fib ([place])  - Protime-INR  7. Hypothyroidism,   - TSH  8. Current use of  long term anticoagulation  - Protime-INR  9. Medication management  - CBC with Differential/Platelet - BASIC METABOLIC PANEL WITH GFR - Hepatic function panel - Magnesium - Lipid panel - TSH - Hemoglobin A1c - Insulin, random       Discussed  regular exercise, BP monitoring, weight control to achieve/maintain BMI less than 25 and discussed med and SE's. Recommended labs to assess and monitor clinical status with further disposition pending results of labs. Over 30 minutes of exam, counseling, chart review was performed.

## 2017-02-10 ENCOUNTER — Encounter: Payer: Self-pay | Admitting: Internal Medicine

## 2017-02-11 ENCOUNTER — Ambulatory Visit (INDEPENDENT_AMBULATORY_CARE_PROVIDER_SITE_OTHER): Payer: PPO | Admitting: Internal Medicine

## 2017-02-11 ENCOUNTER — Encounter: Payer: Self-pay | Admitting: Internal Medicine

## 2017-02-11 VITALS — BP 114/64 | HR 84 | Temp 97.3°F | Resp 20 | Ht 65.0 in | Wt 248.4 lb

## 2017-02-11 DIAGNOSIS — E1122 Type 2 diabetes mellitus with diabetic chronic kidney disease: Secondary | ICD-10-CM | POA: Diagnosis not present

## 2017-02-11 DIAGNOSIS — Z7901 Long term (current) use of anticoagulants: Secondary | ICD-10-CM

## 2017-02-11 DIAGNOSIS — E039 Hypothyroidism, unspecified: Secondary | ICD-10-CM

## 2017-02-11 DIAGNOSIS — E782 Mixed hyperlipidemia: Secondary | ICD-10-CM | POA: Diagnosis not present

## 2017-02-11 DIAGNOSIS — J449 Chronic obstructive pulmonary disease, unspecified: Secondary | ICD-10-CM

## 2017-02-11 DIAGNOSIS — E559 Vitamin D deficiency, unspecified: Secondary | ICD-10-CM | POA: Diagnosis not present

## 2017-02-11 DIAGNOSIS — I10 Essential (primary) hypertension: Secondary | ICD-10-CM | POA: Diagnosis not present

## 2017-02-11 DIAGNOSIS — N183 Chronic kidney disease, stage 3 unspecified: Secondary | ICD-10-CM

## 2017-02-11 DIAGNOSIS — I480 Paroxysmal atrial fibrillation: Secondary | ICD-10-CM | POA: Diagnosis not present

## 2017-02-11 DIAGNOSIS — Z79899 Other long term (current) drug therapy: Secondary | ICD-10-CM

## 2017-02-11 LAB — CBC WITH DIFFERENTIAL/PLATELET
BASOS PCT: 0 %
Basophils Absolute: 0 cells/uL (ref 0–200)
EOS ABS: 116 {cells}/uL (ref 15–500)
Eosinophils Relative: 2 %
HEMATOCRIT: 37.4 % (ref 35.0–45.0)
HEMOGLOBIN: 11.2 g/dL — AB (ref 11.7–15.5)
LYMPHS PCT: 22 %
Lymphs Abs: 1276 cells/uL (ref 850–3900)
MCH: 25.1 pg — ABNORMAL LOW (ref 27.0–33.0)
MCHC: 29.9 g/dL — ABNORMAL LOW (ref 32.0–36.0)
MCV: 83.7 fL (ref 80.0–100.0)
MONO ABS: 348 {cells}/uL (ref 200–950)
MPV: 9.1 fL (ref 7.5–12.5)
Monocytes Relative: 6 %
NEUTROS PCT: 70 %
Neutro Abs: 4060 cells/uL (ref 1500–7800)
Platelets: 230 10*3/uL (ref 140–400)
RBC: 4.47 MIL/uL (ref 3.80–5.10)
RDW: 18.1 % — AB (ref 11.0–15.0)
WBC: 5.8 10*3/uL (ref 3.8–10.8)

## 2017-02-11 LAB — BASIC METABOLIC PANEL WITH GFR
BUN: 21 mg/dL (ref 7–25)
CO2: 31 mmol/L (ref 20–31)
Calcium: 9.4 mg/dL (ref 8.6–10.4)
Chloride: 101 mmol/L (ref 98–110)
Creat: 0.74 mg/dL (ref 0.60–0.93)
GFR, Est Non African American: 82 mL/min (ref >=60)
GLUCOSE: 131 mg/dL — AB (ref 65–99)
POTASSIUM: 4.4 mmol/L (ref 3.5–5.3)
Sodium: 143 mmol/L (ref 135–146)

## 2017-02-11 LAB — LIPID PANEL
Cholesterol: 177 mg/dL (ref <200)
HDL: 59 mg/dL (ref >50)
LDL CALC: 80 mg/dL (ref <100)
Total CHOL/HDL Ratio: 3 Ratio (ref <5.0)
Triglycerides: 191 mg/dL — ABNORMAL HIGH (ref <150)
VLDL: 38 mg/dL — AB (ref <30)

## 2017-02-11 LAB — HEPATIC FUNCTION PANEL
ALBUMIN: 3.9 g/dL (ref 3.6–5.1)
ALK PHOS: 97 U/L (ref 33–130)
ALT: 7 U/L (ref 6–29)
AST: 11 U/L (ref 10–35)
Bilirubin, Direct: 0.1 mg/dL (ref <=0.2)
Indirect Bilirubin: 0.2 mg/dL (ref 0.2–1.2)
TOTAL PROTEIN: 6.3 g/dL (ref 6.1–8.1)
Total Bilirubin: 0.3 mg/dL (ref 0.2–1.2)

## 2017-02-11 LAB — TSH: TSH: 0.29 mIU/L — ABNORMAL LOW

## 2017-02-11 MED ORDER — IPRATROPIUM-ALBUTEROL 0.5-2.5 (3) MG/3ML IN SOLN: RESPIRATORY_TRACT | 3 refills | Status: DC

## 2017-02-11 MED ORDER — BUDESONIDE 0.5 MG/2ML IN SUSP: 0.5000 mg | Freq: Two times a day (BID) | RESPIRATORY_TRACT | 3 refills | Status: DC

## 2017-02-12 LAB — HEMOGLOBIN A1C
HEMOGLOBIN A1C: 6.5 % — AB (ref <5.7)
MEAN PLASMA GLUCOSE: 140 mg/dL

## 2017-02-12 LAB — PROTIME-INR
INR: 2.4 — ABNORMAL HIGH
Prothrombin Time: 24.5 s — ABNORMAL HIGH (ref 9.0–11.5)

## 2017-02-12 LAB — INSULIN, RANDOM: INSULIN: 6.4 u[IU]/mL (ref 2.0–19.6)

## 2017-02-12 LAB — MAGNESIUM: Magnesium: 1.8 mg/dL (ref 1.5–2.5)

## 2017-02-12 LAB — VITAMIN D 25 HYDROXY (VIT D DEFICIENCY, FRACTURES): VIT D 25 HYDROXY: 68 ng/mL (ref 30–100)

## 2017-02-13 ENCOUNTER — Other Ambulatory Visit: Payer: Self-pay | Admitting: Internal Medicine

## 2017-02-16 DIAGNOSIS — J449 Chronic obstructive pulmonary disease, unspecified: Secondary | ICD-10-CM | POA: Diagnosis not present

## 2017-02-21 NOTE — Addendum Note (Signed)
Addendum  created 02/21/17 1133 by Lyn Hollingshead, MD   Sign clinical note

## 2017-03-03 ENCOUNTER — Telehealth: Payer: Self-pay | Admitting: Physician Assistant

## 2017-03-03 DIAGNOSIS — N950 Postmenopausal bleeding: Secondary | ICD-10-CM

## 2017-03-03 NOTE — Telephone Encounter (Signed)
Post menopausal female calling with vaginal bleeing x Friday, no pain, no fever, chills, no urinary symptoms. Will set up for vaginal [place], if any worsening symptoms, any pain, fever, chills go to ER.

## 2017-03-07 ENCOUNTER — Other Ambulatory Visit: Payer: Self-pay | Admitting: Internal Medicine

## 2017-03-07 ENCOUNTER — Ambulatory Visit (HOSPITAL_COMMUNITY)
Admission: RE | Admit: 2017-03-07 | Discharge: 2017-03-07 | Disposition: A | Discharge status: Home / Self Care | Payer: PPO | Source: Ambulatory Visit | Attending: Physician Assistant | Admitting: Physician Assistant

## 2017-03-07 DIAGNOSIS — R938 Abnormal findings on diagnostic imaging of other specified body structures: Secondary | ICD-10-CM | POA: Diagnosis not present

## 2017-03-07 DIAGNOSIS — N950 Postmenopausal bleeding: Secondary | ICD-10-CM | POA: Diagnosis not present

## 2017-03-07 DIAGNOSIS — N83201 Unspecified ovarian cyst, right side: Secondary | ICD-10-CM | POA: Insufficient documentation

## 2017-03-11 DIAGNOSIS — J449 Chronic obstructive pulmonary disease, unspecified: Secondary | ICD-10-CM | POA: Diagnosis not present

## 2017-03-11 DIAGNOSIS — J9610 Chronic respiratory failure, unspecified whether with hypoxia or hypercapnia: Secondary | ICD-10-CM | POA: Diagnosis not present

## 2017-03-12 ENCOUNTER — Telehealth: Payer: Self-pay | Admitting: *Deleted

## 2017-03-12 NOTE — Telephone Encounter (Signed)
Patient advised of her appointment with Dr Radene Knee on 03/17/2017 at 2:40 PM.  Patient states she will be able to go to the appointment. Records are being faxed.

## 2017-03-14 ENCOUNTER — Telehealth: Payer: Self-pay | Admitting: Physician Assistant

## 2017-03-14 ENCOUNTER — Other Ambulatory Visit: Payer: Self-pay | Admitting: Physician Assistant

## 2017-03-14 DIAGNOSIS — J449 Chronic obstructive pulmonary disease, unspecified: Secondary | ICD-10-CM | POA: Diagnosis not present

## 2017-03-14 MED ORDER — PREDNISONE 20 MG PO TABS: ORAL_TABLET | ORAL | 0 refills | Status: DC

## 2017-03-14 MED ORDER — AZITHROMYCIN 250 MG PO TABS: ORAL_TABLET | ORAL | 1 refills | Status: AC

## 2017-03-14 NOTE — Telephone Encounter (Signed)
Patient has history of severe COPD, diastolic heart failure, O2 dependent calling with non productive cough x 2 days, will send in zpak and prednisone, do your inhalers, BUT if worse go to ER/UC, if weight is up go to [facility], remember a lung infection for you can be very very bad

## 2017-03-18 DIAGNOSIS — J449 Chronic obstructive pulmonary disease, unspecified: Secondary | ICD-10-CM | POA: Diagnosis not present

## 2017-03-18 NOTE — Progress Notes (Signed)
Assessment and Plan:  Hypertension:  -Continue medication -monitor blood pressure at home. -Continue DASH diet -Reminder to go to the ER if any CP, SOB, nausea, dizziness, severe HA, changes vision/speech, left arm numbness and tingling and jaw pain.  Diabetes with diabetic chronic kidney disease  -well controlled with current medications -Continue diet and exercise.   COPD oxygen dependent -cont O2 -cont inhalers - due to severe COPD will change from symbicort to budesonide BID via nebulizer to improve pulmonary hygiene and increase expectoration, continue inhaler PRN and DUONEB TID at this time and will try to send to [facility]   Hypothyroidism -tsh -cont levothyroxine  Acute on chronic diastolic CHF (congestive heart failure) ([place])  continue to decrease weight and monitor at home -     BASIC METABOLIC PANEL WITH GFR  Atrial flutter with rapid ventricular response (HCC) Monitor coumadin -     Protime-INR  Pulmonary hypertension ([place]) Stressed importance of using bipap  Morbid Obesity with co morbidities - long discussion about weight loss, diet, and exercise   Continue diet and meds as discussed. Further disposition pending results of labs. Discussed med's effects and SE's.   Future Appointments Date Time Provider [place]  04/10/2017 8:00 PM [facility] 8 [facility]  04/14/2017 1:30 PM Barrett, Evelene Croon, PA-C [facility]  04/21/2017 11:30 AM [name], PA-C [facility] None  05/05/2017 3:15 PM Chesley Mires, MD [facility] None  05/23/2017 10:45 AM Unk Pinto, MD [facility] None  12/16/2017 3:00 PM Unk Pinto, MD [facility] None   HPI 71 y.o. female  presents for 1 month follow up for COPD, heart failure, DM with CKD, and Afib.    Her blood pressure has been controlled at home, today their BP is BP: 122/78.She does not workout. She denies chest pain, dizziness.  Most recently she contacted the office the post menopausal bleeding, vaginal  US showed thickened endometrium and she has appointment with GYN however her husband, Delfino Lovett, is has been in the hospital with several complications and they are leaving here to discuss hospice or different options for him. We discussed that with his poor health and bleeding that dialysis may just complicate things more, we also discussed her own heath and that she will continue to have declining health as well. Will discuss further at her next OV. Denies SI/HI, requesting valium refill for nerves.   Patient has severe end stage COPD with CO2 retention, she is on 5 L of O2 depending on activity level and on diamox for CO2 retention. She walks with walker or is in wheel chair, no falls.  She is unable to do house work due to her COPD and occ needs helps with ADLs. Suppose to have sleep study with pulmonary.  She has Afib is on coumadin, she is now on 7.5mg  2 days and 5 mg 5 days, she was put on zpak and prednisone due to COPD exacerbation and cut her coumadin down in half while on the medications.   Lab Results  Component Value Date   INR 2.4 (H) 02/11/2017   INR 5.2 (H) 01/27/2017   INR 1.1 01/09/2017     She has been working on diet and exercise for diabetes with diabetic chronic kidney disease, she is on bASA, she is on ACE/ARB, and denies  foot ulcerations, hyperglycemia, hypoglycemia , increased appetite, nausea, paresthesia of the feet, polydipsia, polyuria, visual disturbances, vomiting and weight loss. Last A1C was: 02/11/2017: Hgb A1c MFr Bld 6.5   Patient is on Vitamin D  supplement. 02/11/2017: Vit D, 25-Hydroxy 68   She is on thyroid medication. Her medication was changed last visit.   Lab Results  Component Value Date   TSH 0.29 (L) 02/11/2017   BMI is Body mass index is 41.5 kg/m., she is working on diet and exercise. Wt Readings from Last 3 Encounters:  03/20/17 249 lb 6.4 oz (113.1 kg)  02/11/17 248 lb 6.4 oz (112.7 kg)  01/17/17 246 lb (111.6 kg)     Current Medications:   Current Outpatient Prescriptions on File Prior to Visit  Medication Sig Dispense Refill  . Acetaminophen (TYLENOL 8 HOUR ARTHRITIS PAIN PO) Take 1 tablet by mouth. PRN    . acetaminophen-codeine (TYLENOL #3) 300-30 MG tablet Take 1 tablet by mouth every 8 (eight) hours as needed for moderate pain or severe pain. 90 tablet 0  . acetaZOLAMIDE (DIAMOX) 250 MG tablet Take 1 tablet (250 mg total) by mouth 2 (two) times daily. 30 tablet 0  . albuterol (PROVENTIL HFA;VENTOLIN HFA) 108 (90 Base) MCG/ACT inhaler Inhale 1-2 puffs into the lungs every 6 (six) hours as needed for wheezing or shortness of breath. 18 g 2  . aspirin 81 MG tablet Take 81 mg by mouth daily.    . bisoprolol (ZEBETA) 10 MG tablet Take 1 tablet (10 mg total) by mouth daily. 90 tablet 1  . budesonide (PULMICORT) 0.5 MG/2ML nebulizer solution Take 2 mLs (0.5 mg total) by nebulization 2 (two) times daily. 360 mL 3  . budesonide-formoterol (SYMBICORT) 160-4.5 MCG/ACT inhaler Inhale 2 puffs into the lungs 2 (two) times daily. 1 Inhaler 0  . Cholecalciferol (VITAMIN D PO) Take 2,000 Units by mouth daily.     Marland Kitchen diltiazem (CARDIZEM CD) 300 MG 24 hr capsule TAKE 1 CAPSULE (300 MG TOTAL) BY MOUTH DAILY. 90 capsule 1  . DULoxetine (CYMBALTA) 60 MG capsule TAKE 1 CAPSULE BY MOUTH DAILY 90 capsule 1  . famotidine (PEPCID) 20 MG tablet TAKE 1 TABLET (20 MG TOTAL) BY MOUTH 2 (TWO) TIMES DAILY. FOR ACID REFLUX 180 tablet 1  . ferrous sulfate 325 (65 FE) MG tablet Take 325 mg by mouth 2 (two) times daily with a meal.    . fluticasone (FLONASE) 50 MCG/ACT nasal spray Place 2 sprays into both nostrils daily. (Patient taking differently: Place 2 sprays into both nostrils daily as needed for allergies. ) 16 g 0  . furosemide (LASIX) 80 MG tablet TAKE 1 TABLET (80 MG TOTAL) BY MOUTH DAILY. 90 tablet 0  . guaiFENesin (MUCINEX) 600 MG 12 hr tablet Take 600 mg by mouth 2 (two) times daily.     Marland Kitchen ipratropium-albuterol (DUONEB) 0.5-2.5 (3) MG/3ML SOLN Inhale  3 ml by Nebulizer 4 x / day 1080 mL 3  . levothyroxine (SYNTHROID, LEVOTHROID) 175 MCG tablet TAKE 1 TABLET (175 MCG TOTAL) BY MOUTH DAILY BEFORE BREAKFAST. 90 tablet 1  . loratadine-pseudoephedrine (CLARITIN-D 24-HOUR) 10-240 MG per 24 hr tablet Take 1 tablet by mouth as needed for allergies.     Marland Kitchen losartan (COZAAR) 50 MG tablet TAKE 1 TABLET BY MOUTH DAILY 90 tablet 2  . Magnesium Chloride (MAGNESIUM DR PO) Take 1 capsule by mouth daily.     . metFORMIN (GLUCOPHAGE-XR) 500 MG 24 hr tablet TAKE 1 TABLET (500 MG TOTAL) BY MOUTH 4 (FOUR) TIMES DAILY - AFTER MEALS AND AT BEDTIME. 360 tablet 1  . OXYGEN-HELIUM IN Inhale 5-6 L into the lungs continuous.     . pravastatin (PRAVACHOL) 40 MG tablet TAKE 1 TABLET (  40 MG TOTAL) BY MOUTH DAILY. 90 tablet 1  . warfarin (COUMADIN) 5 MG tablet Take 1 to 1.5 tablets daily or as directed. (Patient taking differently: Take 5-7.5 mg by mouth daily at 6 PM. TAKES 7.5MG  ON TUES AND THURS TAKES 5MG  ALL OTHER DAYS) 90 tablet 1   No current facility-administered medications on file prior to visit.    Medical History:  Past Medical History:  Diagnosis Date  . Arthritis   . CHF (congestive heart failure) ([place])   . COPD (chronic obstructive pulmonary disease) ([place])   . Depression   . Diabetes mellitus type 2 in obese ([place])   . GERD (gastroesophageal reflux disease)   . Hyperlipidemia   . Hypertension   . Morbid obesity ([place])   . OAB (overactive bladder)   . PSVT (paroxysmal supraventricular tachycardia) ([place])   . Thyroid disease   . Vitamin D deficiency    Allergies:  Allergies  Allergen Reactions  . Inderal [Propranolol] Other (See Comments)    Hair loss  . Ace Inhibitors Cough     Review of Systems:  Review of Systems  Constitutional: Negative for chills, fever and malaise/fatigue.  HENT: Negative for congestion, ear pain and sore throat.   Eyes: Negative.   Respiratory: Negative for cough, shortness of breath and wheezing.   Cardiovascular:  Negative for chest pain, palpitations and leg swelling.  Gastrointestinal: Negative for abdominal pain, blood in stool, constipation, diarrhea, heartburn and melena.  Genitourinary: Negative.   Skin: Negative.   Neurological: Negative for dizziness, sensory change, loss of consciousness and headaches.  Psychiatric/Behavioral: Negative for depression, hallucinations, memory loss, substance abuse and suicidal ideas. The patient is nervous/anxious. The patient does not have insomnia.     Family history- Review and unchanged  Social history- Review and unchanged  Physical Exam: BP 122/78   Pulse 64   Temp 97.7 F (36.5 C)   Resp 18   Ht 5\' 5"  (1.651 m)   Wt 249 lb 6.4 oz (113.1 kg)   SpO2 95%   BMI 41.50 kg/m  Wt Readings from Last 3 Encounters:  03/20/17 249 lb 6.4 oz (113.1 kg)  02/11/17 248 lb 6.4 oz (112.7 kg)  01/17/17 246 lb (111.6 kg)   General appearance: alert, no distress, WD/WN,  female HEENT: normocephalic, sclerae anicteric, TMs pearly, nares patent, no discharge or erythema, pharynx normal Oral cavity: MMM, no lesions Neck: supple, no lymphadenopathy, no thyromegaly, no masses Heart: Irreg irreg, normal S1, S2, systolic murmur Lungs: decreased breath sounds, no wheezing no rhonchi, no rales, 5 L O2  Abdomen: +bs, soft, non tender, non distended, no masses, no hepatomegaly, no splenomegaly Musculoskeletal: nontender, no swelling, no obvious deformity Extremities: no edema at this time, no cyanosis, no clubbing Pulses: 2+ symmetric, upper and lower extremities, normal cap refill Neurological: alert, oriented x 3, CN2-12 intact, strength normal upper extremities and lower extremities,  DTRs 2+ throughout, no cerebellar signs, patient in wheelchair due to SOB Psychiatric: normal affect, behavior normal, patient crying and clearly upset about her husband richard in the hospital.    [name], PA-C 10:43 AM [facility]

## 2017-03-20 ENCOUNTER — Encounter: Payer: Self-pay | Admitting: Physician Assistant

## 2017-03-20 ENCOUNTER — Ambulatory Visit (INDEPENDENT_AMBULATORY_CARE_PROVIDER_SITE_OTHER): Payer: PPO | Admitting: Physician Assistant

## 2017-03-20 ENCOUNTER — Encounter: Payer: Self-pay | Admitting: Internal Medicine

## 2017-03-20 VITALS — BP 122/78 | HR 64 | Temp 97.7°F | Resp 18 | Ht 65.0 in | Wt 249.4 lb

## 2017-03-20 DIAGNOSIS — E039 Hypothyroidism, unspecified: Secondary | ICD-10-CM

## 2017-03-20 DIAGNOSIS — I272 Pulmonary hypertension, unspecified: Secondary | ICD-10-CM | POA: Diagnosis not present

## 2017-03-20 DIAGNOSIS — J449 Chronic obstructive pulmonary disease, unspecified: Secondary | ICD-10-CM

## 2017-03-20 DIAGNOSIS — I10 Essential (primary) hypertension: Secondary | ICD-10-CM

## 2017-03-20 DIAGNOSIS — J441 Chronic obstructive pulmonary disease with (acute) exacerbation: Secondary | ICD-10-CM | POA: Diagnosis not present

## 2017-03-20 DIAGNOSIS — I4892 Unspecified atrial flutter: Secondary | ICD-10-CM

## 2017-03-20 DIAGNOSIS — I5033 Acute on chronic diastolic (congestive) heart failure: Secondary | ICD-10-CM | POA: Diagnosis not present

## 2017-03-20 DIAGNOSIS — N183 Chronic kidney disease, stage 3 (moderate): Secondary | ICD-10-CM | POA: Diagnosis not present

## 2017-03-20 DIAGNOSIS — E1122 Type 2 diabetes mellitus with diabetic chronic kidney disease: Secondary | ICD-10-CM

## 2017-03-20 LAB — CBC WITH DIFFERENTIAL/PLATELET
BASOS PCT: 0 %
Basophils Absolute: 0 cells/uL (ref 0–200)
EOS ABS: 176 {cells}/uL (ref 15–500)
Eosinophils Relative: 2 %
HEMATOCRIT: 32.4 % — AB (ref 35.0–45.0)
HEMOGLOBIN: 9.6 g/dL — AB (ref 11.7–15.5)
LYMPHS ABS: 1672 {cells}/uL (ref 850–3900)
Lymphocytes Relative: 19 %
MCH: 25.4 pg — ABNORMAL LOW (ref 27.0–33.0)
MCHC: 29.6 g/dL — ABNORMAL LOW (ref 32.0–36.0)
MCV: 85.7 fL (ref 80.0–100.0)
MONO ABS: 704 {cells}/uL (ref 200–950)
MPV: 8.6 fL (ref 7.5–12.5)
Monocytes Relative: 8 %
NEUTROS PCT: 71 %
Neutro Abs: 6248 cells/uL (ref 1500–7800)
Platelets: 338 10*3/uL (ref 140–400)
RBC: 3.78 MIL/uL — AB (ref 3.80–5.10)
RDW: 17 % — ABNORMAL HIGH (ref 11.0–15.0)
WBC: 8.8 10*3/uL (ref 3.8–10.8)

## 2017-03-20 LAB — BASIC METABOLIC PANEL WITH GFR
BUN: 21 mg/dL (ref 7–25)
CO2: 30 mmol/L (ref 20–31)
Calcium: 8.9 mg/dL (ref 8.6–10.4)
Chloride: 101 mmol/L (ref 98–110)
Creat: 0.69 mg/dL (ref 0.60–0.93)
GFR, EST NON AFRICAN AMERICAN: 88 mL/min (ref >=60)
GFR, Est African American: >89 mL/min (ref >=60)
GLUCOSE: 131 mg/dL — AB (ref 65–99)
POTASSIUM: 3.5 mmol/L (ref 3.5–5.3)
Sodium: 142 mmol/L (ref 135–146)

## 2017-03-20 LAB — HEPATIC FUNCTION PANEL
ALK PHOS: 87 U/L (ref 33–130)
ALT: 13 U/L (ref 6–29)
AST: 11 U/L (ref 10–35)
Albumin: 3.7 g/dL (ref 3.6–5.1)
BILIRUBIN INDIRECT: 0.3 mg/dL (ref 0.2–1.2)
Bilirubin, Direct: 0.1 mg/dL (ref <=0.2)
TOTAL PROTEIN: 5.9 g/dL — AB (ref 6.1–8.1)
Total Bilirubin: 0.4 mg/dL (ref 0.2–1.2)

## 2017-03-20 LAB — TSH: TSH: 0.69 mIU/L

## 2017-03-20 MED ORDER — BUDESONIDE 0.5 MG/2ML IN SUSP
0.5000 mg | Freq: Two times a day (BID) | RESPIRATORY_TRACT | 3 refills | Status: AC
Start: 2017-03-20 — End: ?

## 2017-03-20 MED ORDER — DIAZEPAM 5 MG PO TABS: ORAL_TABLET | ORAL | 0 refills | Status: AC

## 2017-03-20 MED ORDER — IPRATROPIUM-ALBUTEROL 0.5-2.5 (3) MG/3ML IN SOLN: RESPIRATORY_TRACT | 3 refills | Status: DC

## 2017-03-20 NOTE — Patient Instructions (Signed)
Hospice  Introduction  Hospice is a service that is designed to provide people who are terminally ill and their families with medical, spiritual, and psychological support. Its aim is to improve your quality of life by keeping you as alert and comfortable as possible.  Who will be my providers when I begin hospice care?  Hospice teams often include:   A nurse.   A doctor. The hospice doctor will be available for your care, but you can bring your regular doctor or nurse practitioner.   Social workers.   Religious leaders (such as a chaplain).   Trained volunteers.    What roles will providers play in my care?  Hospice is performed by a team of health care professionals and volunteers who:   Help keep you comfortable:  ? Hospice can be provided in your home or in a homelike setting.  ? The hospice staff works with your family and friends to help meet your needs.  ? You will enjoy the support of loved ones by receiving much of your basic care from family and friends.   Provide pain relief and manage your symptoms. The staff supply all necessary medicines and equipment.   Provide companionship when you are alone.   Allow you and your family to rest. They may do light housekeeping, prepare meals, and run errands.   Provide counseling. They will make sure your emotional, spiritual, and social needs and those of your family are being met.   Provide spiritual care:  ? Spiritual care will be individualized to meet your needs and your family's needs.  ? Spiritual care may involve:   Helping you look at what death means to you.   Helping you say goodbye to your family and friends.   Performing a specific religious ceremony or ritual.    When should hospice care begin?  Most people who use hospice are believed to have fewer than 6 months to live.   Your family and health care providers can help you decide when hospice services should begin.   If your condition improves, you may discontinue the program.    What  should I consider before selecting a program?  Most hospice programs are run by nonprofit, independent organizations. Some are affiliated with hospitals, nursing homes, or home health care agencies. Hospice programs can take place in the home or at a hospice center, hospital, or skilled nursing facility. When choosing a hospice program, ask the following questions:   What services are available to me?   What services will be offered to my loved ones?   How involved will my loved ones be?   How involved will my health care provider be?   Who makes up the hospice care team? How are they trained or screened?   How will my pain and symptoms be managed?   If my circumstances change, can the services be provided in a different setting, such as my home or in the hospital?   Is the program reviewed and licensed by the state or certified in some other way?    Where can I learn more about hospice?  You can learn about existing hospice programs in your area from your health care providers. You can also read more about hospice online. The websites of the following organizations contain helpful information:   The National Hospice and Palliative Care Organization (NHPCO).   The Hospice Association of America (HAA).   The Hospice Education Institute.   The American Cancer Society (ACS).   

## 2017-03-20 NOTE — Progress Notes (Signed)
Valium was called into pharmacy on 5th July 2018 @ 1:34pm by DD

## 2017-03-21 ENCOUNTER — Other Ambulatory Visit: Payer: Self-pay | Admitting: Internal Medicine

## 2017-03-21 ENCOUNTER — Other Ambulatory Visit: Payer: Self-pay

## 2017-03-21 ENCOUNTER — Telehealth: Payer: Self-pay

## 2017-03-21 ENCOUNTER — Other Ambulatory Visit: Payer: Self-pay | Admitting: Physician Assistant

## 2017-03-21 DIAGNOSIS — Z79899 Other long term (current) drug therapy: Secondary | ICD-10-CM

## 2017-03-21 DIAGNOSIS — D649 Anemia, unspecified: Secondary | ICD-10-CM | POA: Diagnosis not present

## 2017-03-21 DIAGNOSIS — Z7901 Long term (current) use of anticoagulants: Secondary | ICD-10-CM | POA: Diagnosis not present

## 2017-03-21 LAB — CBC WITH DIFFERENTIAL/PLATELET
BASOS PCT: 0 %
Basophils Absolute: 0 cells/uL (ref 0–200)
EOS ABS: 156 {cells}/uL (ref 15–500)
EOS PCT: 2 %
HCT: 32.8 % — ABNORMAL LOW (ref 35.0–45.0)
Hemoglobin: 9.9 g/dL — ABNORMAL LOW (ref 11.7–15.5)
LYMPHS ABS: 1638 {cells}/uL (ref 850–3900)
Lymphocytes Relative: 21 %
MCH: 26 pg — ABNORMAL LOW (ref 27.0–33.0)
MCHC: 30.2 g/dL — ABNORMAL LOW (ref 32.0–36.0)
MCV: 86.1 fL (ref 80.0–100.0)
MONOS PCT: 8 %
MPV: 8.6 fL (ref 7.5–12.5)
Monocytes Absolute: 624 cells/uL (ref 200–950)
NEUTROS ABS: 5382 {cells}/uL (ref 1500–7800)
Neutrophils Relative %: 69 %
PLATELETS: 351 10*3/uL (ref 140–400)
RBC: 3.81 MIL/uL (ref 3.80–5.10)
RDW: 16.8 % — ABNORMAL HIGH (ref 11.0–15.0)
WBC: 7.8 10*3/uL (ref 3.8–10.8)

## 2017-03-21 LAB — PROTIME-INR
INR: 1.9 — ABNORMAL HIGH
PROTHROMBIN TIME: 19.7 s — AB (ref 9.0–11.5)

## 2017-03-21 NOTE — Progress Notes (Signed)
Pt aware of lab results & voiced understanding of those results. Lab went to pt's home to draw STAT blood.

## 2017-03-21 NOTE — Telephone Encounter (Signed)
Pt was made aware o lab results & STAT labs that are needed Lab went out to pt's home to draw STAT lab.  Ok to come per pt.

## 2017-03-21 NOTE — Progress Notes (Signed)
LVM for pt to return office call for LAB results.

## 2017-03-23 ENCOUNTER — Other Ambulatory Visit: Payer: Self-pay | Admitting: Internal Medicine

## 2017-03-23 DIAGNOSIS — J439 Emphysema, unspecified: Secondary | ICD-10-CM

## 2017-04-02 NOTE — Progress Notes (Signed)
Assessment and Plan:  Hypertension:  -Continue medication -monitor blood pressure at home. -Continue DASH diet -Reminder to go to the ER if any CP, SOB, nausea, dizziness, severe HA, changes vision/speech, left arm numbness and tingling and jaw pain.  Diabetes with diabetic chronic kidney disease  -well controlled with current medications -Continue diet and exercise.   COPD oxygen dependent -cont O2 -cont inhalers - due to severe COPD will change from symbicort to budesonide BID via nebulizer to improve pulmonary hygiene and increase expectoration, continue inhaler PRN and DUONEB TID at this time and will [facility] to [facility]   Acute on chronic diastolic CHF (congestive heart failure) ([place])  continue to decrease weight and monitor at home -     BASIC METABOLIC PANEL WITH GFR  Atrial flutter with rapid ventricular response (HCC) Monitor coumadin -     Protime-INR  Pulmonary hypertension ([place]) Stressed importance of using bipap  Morbid Obesity with co morbidities - long discussion about weight loss, diet, and exercise  Anemia Has CKD possible anemia of chronic disease  negative hemoccult card, has follow up with GYN first week august for vaginal bleeding Denies any easy bleeding, will monitor closely Go to er if any SOB, dizziness, weakness, gross blood  Grief Continue valium PRN, hospice counseling number given Will call if any issues.   Continue diet and meds as discussed. Further disposition pending results of labs. Discussed med's effects and SE's.   Future Appointments Date Time Provider [place]  04/10/2017 8:00 PM [facility] 8 [facility]  04/21/2017 11:30 AM [name], PA-C [facility] None  04/23/2017 2:30 PM Barrett, Evelene Croon, PA-C [facility]  05/05/2017 3:15 PM Chesley Mires, MD [facility] None  05/23/2017 10:45 AM Unk Pinto, MD [facility] None  12/16/2017 3:00 PM Unk Pinto, MD [facility] None   HPI 71 y.o. female  presents  for 1 month follow up for COPD, heart failure, DM with CKD, and Afib presents with worsening anemia and depression.   She is on coumadin for Afib, had a drop in her H/H, was holding steady last time it was checked.  Patient is suppose to follow up with GYN for post menopausal bleeding, vaginal US showed thickened endometrium, appointment postponed until first week of august. She denies any further bleeding. Denies blood in her stool/urine. Does have black stool occ but is on iron.   She is very depressed, her husband, Jillian Wood, recently died in hospice, has valium for nerves/anxiety takes PRN, has her sons to help support her.    Patient has severe end stage COPD with CO2 retention, she is on 5 L of O2 depending on activity level and on diamox for CO2 retention. She walks with walker or is in wheel chair, no falls.  She is unable to do house work due to her COPD and occ needs helps with ADLs. Suppose to have sleep study with pulmonary, going to do next Thursday, HOWEVER she is doing BIPAP at home. States breathing has been better with Bipap She has Afib is on coumadin, she is now on 7.5mg  2 days and 5 mg 5 days.  Lab Results  Component Value Date   INR 2.1 (H) 04/03/2017   INR 1.9 (H) 03/21/2017   INR CANCELED 03/20/2017   BMI is Body mass index is 40.44 kg/m., she is working on diet and exercise. Wt Readings from Last 3 Encounters:  04/03/17 243 lb (110.2 kg)  03/20/17 249 lb 6.4 oz (113.1 kg)  02/11/17 248 lb 6.4  oz (112.7 kg)     Current Medications:  Current Outpatient Prescriptions on File Prior to Visit  Medication Sig Dispense Refill  . Acetaminophen (TYLENOL 8 HOUR ARTHRITIS PAIN PO) Take 1 tablet by mouth. PRN    . acetaminophen-codeine (TYLENOL #3) 300-30 MG tablet Take 1 tablet by mouth every 8 (eight) hours as needed for moderate pain or severe pain. 90 tablet 0  . acetaZOLAMIDE (DIAMOX) 250 MG tablet TAKE 1 TABLET (250 MG TOTAL) BY MOUTH 3 (THREE) TIMES DAILY. 270 tablet 1   . albuterol (PROVENTIL HFA;VENTOLIN HFA) 108 (90 Base) MCG/ACT inhaler Inhale 1-2 puffs into the lungs every 6 (six) hours as needed for wheezing or shortness of breath. 18 g 2  . aspirin 81 MG tablet Take 81 mg by mouth daily.    . bisoprolol (ZEBETA) 10 MG tablet Take 1 tablet (10 mg total) by mouth daily. 90 tablet 1  . budesonide (PULMICORT) 0.5 MG/2ML nebulizer solution Take 2 mLs (0.5 mg total) by nebulization 2 (two) times daily. 360 mL 3  . budesonide-formoterol (SYMBICORT) 160-4.5 MCG/ACT inhaler Inhale 2 puffs into the lungs 2 (two) times daily. 1 Inhaler 0  . Cholecalciferol (VITAMIN D PO) Take 2,000 Units by mouth daily.     . diazepam (VALIUM) 5 MG tablet Take one tablet up to 2 x a daily for anxiety AS NEEDED 60 tablet 0  . diltiazem (CARDIZEM CD) 300 MG 24 hr capsule TAKE 1 CAPSULE (300 MG TOTAL) BY MOUTH DAILY. 90 capsule 1  . DULoxetine (CYMBALTA) 60 MG capsule TAKE 1 CAPSULE BY MOUTH DAILY 90 capsule 1  . famotidine (PEPCID) 20 MG tablet TAKE 1 TABLET (20 MG TOTAL) BY MOUTH 2 (TWO) TIMES DAILY. FOR ACID REFLUX 180 tablet 1  . ferrous sulfate 325 (65 FE) MG tablet Take 325 mg by mouth 2 (two) times daily with a meal.    . fluticasone (FLONASE) 50 MCG/ACT nasal spray Place 2 sprays into both nostrils daily. (Patient taking differently: Place 2 sprays into both nostrils daily as needed for allergies. ) 16 g 0  . furosemide (LASIX) 80 MG tablet TAKE 1 TABLET (80 MG TOTAL) BY MOUTH DAILY. 90 tablet 0  . guaiFENesin (MUCINEX) 600 MG 12 hr tablet Take 600 mg by mouth 2 (two) times daily.     Marland Kitchen ipratropium-albuterol (DUONEB) 0.5-2.5 (3) MG/3ML SOLN Inhale 3 ml by Nebulizer 4 x / day 1080 mL 3  . levothyroxine (SYNTHROID, LEVOTHROID) 175 MCG tablet TAKE 1 TABLET (175 MCG TOTAL) BY MOUTH DAILY BEFORE BREAKFAST. 90 tablet 1  . loratadine-pseudoephedrine (CLARITIN-D 24-HOUR) 10-240 MG per 24 hr tablet Take 1 tablet by mouth as needed for allergies.     Marland Kitchen losartan (COZAAR) 50 MG tablet TAKE 1  TABLET BY MOUTH DAILY 90 tablet 2  . Magnesium Chloride (MAGNESIUM DR PO) Take 1 capsule by mouth daily.     . metFORMIN (GLUCOPHAGE-XR) 500 MG 24 hr tablet TAKE 1 TABLET (500 MG TOTAL) BY MOUTH 4 (FOUR) TIMES DAILY - AFTER MEALS AND AT BEDTIME. 360 tablet 1  . OXYGEN-HELIUM IN Inhale 5-6 L into the lungs continuous.     . pravastatin (PRAVACHOL) 40 MG tablet TAKE 1 TABLET (40 MG TOTAL) BY MOUTH DAILY. 90 tablet 1  . warfarin (COUMADIN) 5 MG tablet Take 1 to 1.5 tablets daily or as directed. (Patient taking differently: Take 5-7.5 mg by mouth daily at 6 PM. TAKES 7.5MG  ON TUES AND THURS TAKES 5MG  ALL OTHER DAYS) 90 tablet 1  No current facility-administered medications on file prior to visit.    Medical History:  Past Medical History:  Diagnosis Date  . Arthritis   . CHF (congestive heart failure) ([place])   . COPD (chronic obstructive pulmonary disease) ([place])   . Depression   . Diabetes mellitus type 2 in obese ([place])   . GERD (gastroesophageal reflux disease)   . Hyperlipidemia   . Hypertension   . Morbid obesity ([place])   . OAB (overactive bladder)   . PSVT (paroxysmal supraventricular tachycardia) ([place])   . Thyroid disease   . Vitamin D deficiency    Allergies:  Allergies  Allergen Reactions  . Inderal [Propranolol] Other (See Comments)    Hair loss  . Ace Inhibitors Cough     Review of Systems:  Review of Systems  Constitutional: Negative for chills, fever and malaise/fatigue.  HENT: Negative for congestion, ear pain and sore throat.   Eyes: Negative.   Respiratory: Negative for cough, shortness of breath and wheezing.   Cardiovascular: Negative for chest pain, palpitations and leg swelling.  Gastrointestinal: Negative for abdominal pain, blood in stool, constipation, diarrhea, heartburn and melena.  Genitourinary: Negative.   Skin: Negative.   Neurological: Negative for dizziness, sensory change, loss of consciousness and headaches.  Psychiatric/Behavioral: Negative for  depression, hallucinations, memory loss, substance abuse and suicidal ideas. The patient is nervous/anxious. The patient does not have insomnia.     Family history- Review and unchanged  Social history- Review and unchanged  Physical Exam: BP 130/90   Pulse 89   Temp (!) 97.2 F (36.2 C)   Resp 18   Ht 5\' 5"  (1.651 m)   Wt 243 lb (110.2 kg)   SpO2 93%   BMI 40.44 kg/m  Wt Readings from Last 3 Encounters:  04/03/17 243 lb (110.2 kg)  03/20/17 249 lb 6.4 oz (113.1 kg)  02/11/17 248 lb 6.4 oz (112.7 kg)   General appearance: alert, no distress, WD/WN,  female HEENT: normocephalic, sclerae anicteric, TMs pearly, nares patent, no discharge or erythema, pharynx normal Oral cavity: MMM, no lesions Neck: supple, no lymphadenopathy, no thyromegaly, no masses Heart: Irreg irreg, normal S1, S2, systolic murmur Lungs: decreased breath sounds, no wheezing no rhonchi, no rales, 5 L O2  Abdomen: +bs, soft, non tender, non distended, no masses, no hepatomegaly, no splenomegaly Musculoskeletal: nontender, no swelling, no obvious deformity Extremities: no edema at this time, no cyanosis, no clubbing Pulses: 2+ symmetric, upper and lower extremities, normal cap refill Rectal exam: negative without mass, lesions or tenderness, external hemorrhoids noted, stool guaiac negative. Neurological: alert, oriented x 3, CN2-12 intact, strength normal upper extremities and lower extremities,  DTRs 2+ throughout, no cerebellar signs, patient in wheelchair due to SOB Psychiatric: normal affect, behavior normal, patient crying and clearly upset about her husband richard in the hospital.    [name], PA-C 7:26 AM [facility]

## 2017-04-03 ENCOUNTER — Ambulatory Visit (INDEPENDENT_AMBULATORY_CARE_PROVIDER_SITE_OTHER): Payer: PPO | Admitting: Physician Assistant

## 2017-04-03 ENCOUNTER — Encounter: Payer: Self-pay | Admitting: Physician Assistant

## 2017-04-03 VITALS — BP 130/90 | HR 89 | Temp 97.2°F | Resp 18 | Ht 65.0 in | Wt 243.0 lb

## 2017-04-03 DIAGNOSIS — I272 Pulmonary hypertension, unspecified: Secondary | ICD-10-CM

## 2017-04-03 DIAGNOSIS — I5033 Acute on chronic diastolic (congestive) heart failure: Secondary | ICD-10-CM

## 2017-04-03 DIAGNOSIS — E538 Deficiency of other specified B group vitamins: Secondary | ICD-10-CM | POA: Diagnosis not present

## 2017-04-03 DIAGNOSIS — I10 Essential (primary) hypertension: Secondary | ICD-10-CM

## 2017-04-03 DIAGNOSIS — D509 Iron deficiency anemia, unspecified: Secondary | ICD-10-CM

## 2017-04-03 DIAGNOSIS — I4892 Unspecified atrial flutter: Secondary | ICD-10-CM

## 2017-04-03 LAB — CBC WITH DIFFERENTIAL/PLATELET
Basophils Absolute: 0 cells/uL (ref 0–200)
Basophils Relative: 0 %
Eosinophils Absolute: 77 cells/uL (ref 15–500)
Eosinophils Relative: 1 %
HEMATOCRIT: 34.5 % — AB (ref 35.0–45.0)
HEMOGLOBIN: 10.3 g/dL — AB (ref 11.7–15.5)
LYMPHS ABS: 1309 {cells}/uL (ref 850–3900)
Lymphocytes Relative: 17 %
MCH: 26 pg — ABNORMAL LOW (ref 27.0–33.0)
MCHC: 29.9 g/dL — AB (ref 32.0–36.0)
MCV: 87.1 fL (ref 80.0–100.0)
MONO ABS: 462 {cells}/uL (ref 200–950)
MPV: 9.1 fL (ref 7.5–12.5)
Monocytes Relative: 6 %
NEUTROS ABS: 5852 {cells}/uL (ref 1500–7800)
NEUTROS PCT: 76 %
Platelets: 297 10*3/uL (ref 140–400)
RBC: 3.96 MIL/uL (ref 3.80–5.10)
RDW: 16.1 % — ABNORMAL HIGH (ref 11.0–15.0)
WBC: 7.7 10*3/uL (ref 3.8–10.8)

## 2017-04-04 LAB — HEPATIC FUNCTION PANEL
ALK PHOS: 88 U/L (ref 33–130)
ALT: 7 U/L (ref 6–29)
AST: 12 U/L (ref 10–35)
Albumin: 3.6 g/dL (ref 3.6–5.1)
BILIRUBIN DIRECT: 0.1 mg/dL (ref <=0.2)
BILIRUBIN INDIRECT: 0.2 mg/dL (ref 0.2–1.2)
TOTAL PROTEIN: 6.3 g/dL (ref 6.1–8.1)
Total Bilirubin: 0.3 mg/dL (ref 0.2–1.2)

## 2017-04-04 LAB — VITAMIN B12

## 2017-04-04 LAB — PROTIME-INR
INR: 2.1 — AB
PROTHROMBIN TIME: 22.2 s — AB (ref 9.0–11.5)

## 2017-04-04 LAB — BASIC METABOLIC PANEL WITH GFR
BUN: 23 mg/dL (ref 7–25)
CHLORIDE: 100 mmol/L (ref 98–110)
CO2: 25 mmol/L (ref 20–31)
Calcium: 9.3 mg/dL (ref 8.6–10.4)
Creat: 0.83 mg/dL (ref 0.60–0.93)
GFR, EST NON AFRICAN AMERICAN: 71 mL/min (ref >=60)
GFR, Est African American: 82 mL/min (ref >=60)
GLUCOSE: 111 mg/dL — AB (ref 65–99)
POTASSIUM: 4.1 mmol/L (ref 3.5–5.3)
Sodium: 142 mmol/L (ref 135–146)

## 2017-04-04 LAB — IRON AND TIBC
%SAT: 7 % — ABNORMAL LOW (ref 11–50)
Iron: 35 ug/dL — ABNORMAL LOW (ref 45–160)
TIBC: 531 ug/dL — ABNORMAL HIGH (ref 250–450)
UIBC: 496 ug/dL

## 2017-04-04 NOTE — Progress Notes (Signed)
Patient has not heard from [place] about rx that was Faxed to [place] 03-20-17 for Dueoneb and Pulmicort. Called and spoke with [name] at [facility] to check status. He will call be back with update.

## 2017-04-08 ENCOUNTER — Other Ambulatory Visit: Payer: Self-pay | Admitting: Internal Medicine

## 2017-04-10 ENCOUNTER — Encounter (HOSPITAL_BASED_OUTPATIENT_CLINIC_OR_DEPARTMENT_OTHER): Payer: PPO

## 2017-04-10 DIAGNOSIS — J9610 Chronic respiratory failure, unspecified whether with hypoxia or hypercapnia: Secondary | ICD-10-CM | POA: Diagnosis not present

## 2017-04-10 DIAGNOSIS — J449 Chronic obstructive pulmonary disease, unspecified: Secondary | ICD-10-CM | POA: Diagnosis not present

## 2017-04-13 DIAGNOSIS — J449 Chronic obstructive pulmonary disease, unspecified: Secondary | ICD-10-CM | POA: Diagnosis not present

## 2017-04-14 ENCOUNTER — Ambulatory Visit: Payer: PPO | Admitting: Physician Assistant

## 2017-04-18 DIAGNOSIS — J449 Chronic obstructive pulmonary disease, unspecified: Secondary | ICD-10-CM | POA: Diagnosis not present

## 2017-04-18 NOTE — Progress Notes (Deleted)
Assessment and Plan:  Hypertension:  -Continue medication -monitor blood pressure at home. -Continue DASH diet -Reminder to go to the ER if any CP, SOB, nausea, dizziness, severe HA, changes vision/speech, left arm numbness and tingling and jaw pain.  Diabetes with diabetic chronic kidney disease  -well controlled with current medications -Continue diet and exercise.   COPD oxygen dependent -cont O2 -cont inhalers - due to severe COPD will change from symbicort to budesonide BID via nebulizer to improve pulmonary hygiene and increase expectoration, continue inhaler PRN and DUONEB TID at this time and will try to [facility]   Acute on chronic diastolic CHF (congestive heart failure) ([place])  continue to decrease weight and monitor at home -     BASIC METABOLIC PANEL WITH GFR  Atrial flutter with rapid ventricular response (HCC) Monitor coumadin -     Protime-INR  Pulmonary hypertension ([place]) Stressed importance of using bipap  Morbid Obesity with co morbidities - long discussion about weight loss, diet, and exercise  Anemia Has CKD possible anemia of chronic disease  negative hemoccult card, has follow up with GYN first week august for vaginal bleeding Denies any easy bleeding, will monitor closely Go to er if any SOB, dizziness, weakness, gross blood  Grief Continue valium PRN, hospice counseling number given Will call if any issues.   Continue diet and meds as discussed. Further disposition pending results of labs. Discussed med's effects and SE's.   Future Appointments Date Time Provider [place]  04/21/2017 11:30 AM [name], PA-C [facility] None  04/23/2017 2:30 PM Barrett, Evelene Croon, PA-C [facility]  05/05/2017 3:15 PM Chesley Mires, MD [facility] None  05/23/2017 10:45 AM Unk Pinto, MD [facility] None  12/16/2017 3:00 PM Unk Pinto, MD [facility] None   HPI 71 y.o. female  presents for 1 month follow up for COPD, heart failure, DM  with CKD, and Afib presents with worsening anemia and depression.   She is on coumadin for Afib, had a drop in her H/H, was holding steady last time it was checked.  Patient is suppose to follow up with GYN for post menopausal bleeding, vaginal US showed thickened endometrium, appointment postponed until first week of august. She denies any further bleeding. Denies blood in her stool/urine. Does have black stool occ but is on iron.   She is very depressed, her husband, Jillian Wood, recently died in hospice, has valium for nerves/anxiety takes PRN, has her sons to help support her.    Patient has severe end stage COPD with CO2 retention, she is on 5 L of O2 depending on activity level and on diamox for CO2 retention. She walks with walker or is in wheel chair, no falls.  She is unable to do house work due to her COPD and occ needs helps with ADLs. Suppose to have sleep study with pulmonary, going to do next Thursday, HOWEVER she is doing BIPAP at home. States breathing has been better with Bipap She has Afib is on coumadin, she is now on 7.5mg  2 days and 5 mg 5 days.  Lab Results  Component Value Date   INR 2.1 (H) 04/03/2017   INR 1.9 (H) 03/21/2017   INR CANCELED 03/20/2017   BMI is There is no height or weight on file to calculate BMI., she is working on diet and exercise. Wt Readings from Last 3 Encounters:  04/03/17 243 lb (110.2 kg)  03/20/17 249 lb 6.4 oz (113.1 kg)  02/11/17 248 lb 6.4 oz (112.7 kg)  Current Medications:  Current Outpatient Prescriptions on File Prior to Visit  Medication Sig Dispense Refill  . Acetaminophen (TYLENOL 8 HOUR ARTHRITIS PAIN PO) Take 1 tablet by mouth. PRN    . acetaminophen-codeine (TYLENOL #3) 300-30 MG tablet Take 1 tablet by mouth every 8 (eight) hours as needed for moderate pain or severe pain. 90 tablet 0  . acetaZOLAMIDE (DIAMOX) 250 MG tablet TAKE 1 TABLET (250 MG TOTAL) BY MOUTH 3 (THREE) TIMES DAILY. 270 tablet 1  . albuterol (PROVENTIL  HFA;VENTOLIN HFA) 108 (90 Base) MCG/ACT inhaler Inhale 1-2 puffs into the lungs every 6 (six) hours as needed for wheezing or shortness of breath. 18 g 2  . aspirin 81 MG tablet Take 81 mg by mouth daily.    . bisoprolol (ZEBETA) 10 MG tablet Take 1 tablet (10 mg total) by mouth daily. 90 tablet 1  . budesonide (PULMICORT) 0.5 MG/2ML nebulizer solution Take 2 mLs (0.5 mg total) by nebulization 2 (two) times daily. 360 mL 3  . budesonide-formoterol (SYMBICORT) 160-4.5 MCG/ACT inhaler Inhale 2 puffs into the lungs 2 (two) times daily. 1 Inhaler 0  . Cholecalciferol (VITAMIN D PO) Take 2,000 Units by mouth daily.     . diazepam (VALIUM) 5 MG tablet Take one tablet up to 2 x a daily for anxiety AS NEEDED 60 tablet 0  . diltiazem (CARDIZEM CD) 300 MG 24 hr capsule TAKE 1 CAPSULE (300 MG TOTAL) BY MOUTH DAILY. 90 capsule 1  . DULoxetine (CYMBALTA) 60 MG capsule TAKE 1 CAPSULE BY MOUTH DAILY 90 capsule 1  . famotidine (PEPCID) 20 MG tablet TAKE 1 TABLET (20 MG TOTAL) BY MOUTH 2 (TWO) TIMES DAILY. FOR ACID REFLUX 180 tablet 1  . ferrous sulfate 325 (65 FE) MG tablet Take 325 mg by mouth 2 (two) times daily with a meal.    . fluticasone (FLONASE) 50 MCG/ACT nasal spray Place 2 sprays into both nostrils daily. (Patient taking differently: Place 2 sprays into both nostrils daily as needed for allergies. ) 16 g 0  . furosemide (LASIX) 80 MG tablet TAKE 1 TABLET (80 MG TOTAL) BY MOUTH DAILY. 90 tablet 0  . guaiFENesin (MUCINEX) 600 MG 12 hr tablet Take 600 mg by mouth 2 (two) times daily.     Marland Kitchen ipratropium-albuterol (DUONEB) 0.5-2.5 (3) MG/3ML SOLN Inhale 3 ml by Nebulizer 4 x / day 1080 mL 3  . levothyroxine (SYNTHROID, LEVOTHROID) 175 MCG tablet TAKE 1 TABLET (175 MCG TOTAL) BY MOUTH DAILY BEFORE BREAKFAST. 90 tablet 1  . loratadine-pseudoephedrine (CLARITIN-D 24-HOUR) 10-240 MG per 24 hr tablet Take 1 tablet by mouth as needed for allergies.     Marland Kitchen losartan (COZAAR) 50 MG tablet TAKE 1 TABLET BY MOUTH DAILY 90  tablet 2  . Magnesium Chloride (MAGNESIUM DR PO) Take 1 capsule by mouth daily.     . metFORMIN (GLUCOPHAGE-XR) 500 MG 24 hr tablet TAKE 1 TABLET (500 MG TOTAL) BY MOUTH 4 (FOUR) TIMES DAILY - AFTER MEALS AND AT BEDTIME. 360 tablet 1  . OXYGEN-HELIUM IN Inhale 5-6 L into the lungs continuous.     . pravastatin (PRAVACHOL) 40 MG tablet TAKE 1 TABLET (40 MG TOTAL) BY MOUTH DAILY. 90 tablet 1  . warfarin (COUMADIN) 5 MG tablet Take 1 to 1.5 tablets daily or as directed. (Patient taking differently: Take 5-7.5 mg by mouth daily at 6 PM. TAKES 7.5MG  ON TUES AND THURS TAKES 5MG  ALL OTHER DAYS) 90 tablet 1   No current facility-administered medications on file  prior to visit.    Medical History:  Past Medical History:  Diagnosis Date  . Arthritis   . CHF (congestive heart failure) ([place])   . COPD (chronic obstructive pulmonary disease) ([place])   . Depression   . Diabetes mellitus type 2 in obese ([place])   . GERD (gastroesophageal reflux disease)   . Hyperlipidemia   . Hypertension   . Morbid obesity ([place])   . OAB (overactive bladder)   . PSVT (paroxysmal supraventricular tachycardia) ([place])   . Thyroid disease   . Vitamin D deficiency    Allergies:  Allergies  Allergen Reactions  . Inderal [Propranolol] Other (See Comments)    Hair loss  . Ace Inhibitors Cough     Review of Systems:  Review of Systems  Constitutional: Negative for chills, fever and malaise/fatigue.  HENT: Negative for congestion, ear pain and sore throat.   Eyes: Negative.   Respiratory: Negative for cough, shortness of breath and wheezing.   Cardiovascular: Negative for chest pain, palpitations and leg swelling.  Gastrointestinal: Negative for abdominal pain, blood in stool, constipation, diarrhea, heartburn and melena.  Genitourinary: Negative.   Skin: Negative.   Neurological: Negative for dizziness, sensory change, loss of consciousness and headaches.  Psychiatric/Behavioral: Negative for depression,  hallucinations, memory loss, substance abuse and suicidal ideas. The patient is nervous/anxious. The patient does not have insomnia.     Family history- Review and unchanged  Social history- Review and unchanged  Physical Exam: There were no vitals taken for this visit. Wt Readings from Last 3 Encounters:  04/03/17 243 lb (110.2 kg)  03/20/17 249 lb 6.4 oz (113.1 kg)  02/11/17 248 lb 6.4 oz (112.7 kg)   General appearance: alert, no distress, WD/WN,  female HEENT: normocephalic, sclerae anicteric, TMs pearly, nares patent, no discharge or erythema, pharynx normal Oral cavity: MMM, no lesions Neck: supple, no lymphadenopathy, no thyromegaly, no masses Heart: Irreg irreg, normal S1, S2, systolic murmur Lungs: decreased breath sounds, no wheezing no rhonchi, no rales, 5 L O2  Abdomen: +bs, soft, non tender, non distended, no masses, no hepatomegaly, no splenomegaly Musculoskeletal: nontender, no swelling, no obvious deformity Extremities: no edema at this time, no cyanosis, no clubbing Pulses: 2+ symmetric, upper and lower extremities, normal cap refill Rectal exam: negative without mass, lesions or tenderness, external hemorrhoids noted, stool guaiac negative. Neurological: alert, oriented x 3, CN2-12 intact, strength normal upper extremities and lower extremities,  DTRs 2+ throughout, no cerebellar signs, patient in wheelchair due to SOB Psychiatric: normal affect, behavior normal, patient crying and clearly upset about her husband richard in the hospital.    [name], PA-C 2:04 PM [facility]

## 2017-04-21 ENCOUNTER — Ambulatory Visit: Payer: Self-pay | Admitting: Physician Assistant

## 2017-04-23 ENCOUNTER — Ambulatory Visit: Payer: PPO | Admitting: Physician Assistant

## 2017-04-23 NOTE — Progress Notes (Deleted)
Assessment and Plan:  Hypertension:  -Continue medication -monitor blood pressure at home. -Continue DASH diet -Reminder to go to the ER if any CP, SOB, nausea, dizziness, severe HA, changes vision/speech, left arm numbness and tingling and jaw pain.  Diabetes with diabetic chronic kidney disease  -well controlled with current medications -Continue diet and exercise.   COPD oxygen dependent -cont O2 -cont inhalers - due to severe COPD will change from symbicort to budesonide BID via nebulizer to improve pulmonary hygiene and increase expectoration, continue inhaler PRN and DUONEB TID at this time and will try to send to [facility]   Acute on chronic diastolic CHF (congestive heart failure) ([place])  continue to decrease weight and monitor at home -     BASIC METABOLIC PANEL WITH GFR  Atrial flutter with rapid ventricular response (HCC) Monitor coumadin -     Protime-INR  Pulmonary hypertension ([place]) Stressed importance of using bipap  Morbid Obesity with co morbidities - long discussion about weight loss, diet, and exercise  Anemia Has CKD possible anemia of chronic disease  negative hemoccult card, has follow up with GYN first week august for vaginal bleeding Denies any easy bleeding, will monitor closely Go to er if any SOB, dizziness, weakness, gross blood  Grief Continue valium PRN, hospice counseling number given Will call if any issues.   Continue diet and meds as discussed. Further disposition pending results of labs. Discussed med's effects and SE's.   Future Appointments Date Time Provider [place]  04/24/2017 2:45 PM [name], PA-C [facility] None  05/05/2017 3:15 PM Chesley Mires, MD [facility] None  05/16/2017 2:30 PM Barrett, Felisa Bonier [facility]  05/23/2017 10:45 AM Unk Pinto, MD [facility] None  12/16/2017 3:00 PM Unk Pinto, MD [facility] None   HPI 71 y.o. female  presents for 1 month follow up for COPD, heart failure, DM  with CKD, and Afib presents with worsening anemia and depression.   She is on coumadin for Afib, had a drop in her H/H, was holding steady last time it was checked.  Patient is suppose to follow up with GYN for post menopausal bleeding, vaginal US showed thickened endometrium, appointment postponed until first week of august. She denies any further bleeding. Denies blood in her stool/urine. Does have black stool occ but is on iron.   She is very depressed, her husband, Delfino Lovett, recently died in hospice, has valium for nerves/anxiety takes PRN, has her sons to help support her.    Patient has severe end stage COPD with CO2 retention, she is on 5 L of O2 depending on activity level and on diamox for CO2 retention. She walks with walker or is in wheel chair, no falls.  She is unable to do house work due to her COPD and occ needs helps with ADLs. Suppose to have sleep study with pulmonary, going to do next Thursday, HOWEVER she is doing BIPAP at home. States breathing has been better with Bipap She has Afib is on coumadin, she is now on 7.5mg  2 days and 5 mg 5 days.  Lab Results  Component Value Date   INR 2.1 (H) 04/03/2017   INR 1.9 (H) 03/21/2017   INR CANCELED 03/20/2017   BMI is There is no height or weight on file to calculate BMI., she is working on diet and exercise. Wt Readings from Last 3 Encounters:  04/03/17 243 lb (110.2 kg)  03/20/17 249 lb 6.4 oz (113.1 kg)  02/11/17 248 lb 6.4 oz (112.7 kg)  Current Medications:  Current Outpatient Prescriptions on File Prior to Visit  Medication Sig Dispense Refill  . Acetaminophen (TYLENOL 8 HOUR ARTHRITIS PAIN PO) Take 1 tablet by mouth. PRN    . acetaminophen-codeine (TYLENOL #3) 300-30 MG tablet Take 1 tablet by mouth every 8 (eight) hours as needed for moderate pain or severe pain. 90 tablet 0  . acetaZOLAMIDE (DIAMOX) 250 MG tablet TAKE 1 TABLET (250 MG TOTAL) BY MOUTH 3 (THREE) TIMES DAILY. 270 tablet 1  . albuterol (PROVENTIL  HFA;VENTOLIN HFA) 108 (90 Base) MCG/ACT inhaler Inhale 1-2 puffs into the lungs every 6 (six) hours as needed for wheezing or shortness of breath. 18 g 2  . aspirin 81 MG tablet Take 81 mg by mouth daily.    . bisoprolol (ZEBETA) 10 MG tablet Take 1 tablet (10 mg total) by mouth daily. 90 tablet 1  . budesonide (PULMICORT) 0.5 MG/2ML nebulizer solution Take 2 mLs (0.5 mg total) by nebulization 2 (two) times daily. 360 mL 3  . budesonide-formoterol (SYMBICORT) 160-4.5 MCG/ACT inhaler Inhale 2 puffs into the lungs 2 (two) times daily. 1 Inhaler 0  . Cholecalciferol (VITAMIN D PO) Take 2,000 Units by mouth daily.     . diazepam (VALIUM) 5 MG tablet Take one tablet up to 2 x a daily for anxiety AS NEEDED 60 tablet 0  . diltiazem (CARDIZEM CD) 300 MG 24 hr capsule TAKE 1 CAPSULE (300 MG TOTAL) BY MOUTH DAILY. 90 capsule 1  . DULoxetine (CYMBALTA) 60 MG capsule TAKE 1 CAPSULE BY MOUTH DAILY 90 capsule 1  . famotidine (PEPCID) 20 MG tablet TAKE 1 TABLET (20 MG TOTAL) BY MOUTH 2 (TWO) TIMES DAILY. FOR ACID REFLUX 180 tablet 1  . ferrous sulfate 325 (65 FE) MG tablet Take 325 mg by mouth 2 (two) times daily with a meal.    . fluticasone (FLONASE) 50 MCG/ACT nasal spray Place 2 sprays into both nostrils daily. (Patient taking differently: Place 2 sprays into both nostrils daily as needed for allergies. ) 16 g 0  . furosemide (LASIX) 80 MG tablet TAKE 1 TABLET (80 MG TOTAL) BY MOUTH DAILY. 90 tablet 0  . guaiFENesin (MUCINEX) 600 MG 12 hr tablet Take 600 mg by mouth 2 (two) times daily.     Marland Kitchen ipratropium-albuterol (DUONEB) 0.5-2.5 (3) MG/3ML SOLN Inhale 3 ml by Nebulizer 4 x / day 1080 mL 3  . levothyroxine (SYNTHROID, LEVOTHROID) 175 MCG tablet TAKE 1 TABLET (175 MCG TOTAL) BY MOUTH DAILY BEFORE BREAKFAST. 90 tablet 1  . loratadine-pseudoephedrine (CLARITIN-D 24-HOUR) 10-240 MG per 24 hr tablet Take 1 tablet by mouth as needed for allergies.     Marland Kitchen losartan (COZAAR) 50 MG tablet TAKE 1 TABLET BY MOUTH DAILY 90  tablet 2  . Magnesium Chloride (MAGNESIUM DR PO) Take 1 capsule by mouth daily.     . metFORMIN (GLUCOPHAGE-XR) 500 MG 24 hr tablet TAKE 1 TABLET (500 MG TOTAL) BY MOUTH 4 (FOUR) TIMES DAILY - AFTER MEALS AND AT BEDTIME. 360 tablet 1  . OXYGEN-HELIUM IN Inhale 5-6 L into the lungs continuous.     . pravastatin (PRAVACHOL) 40 MG tablet TAKE 1 TABLET (40 MG TOTAL) BY MOUTH DAILY. 90 tablet 1  . warfarin (COUMADIN) 5 MG tablet Take 1 to 1.5 tablets daily or as directed. (Patient taking differently: Take 5-7.5 mg by mouth daily at 6 PM. TAKES 7.5MG  ON TUES AND THURS TAKES 5MG  ALL OTHER DAYS) 90 tablet 1   No current facility-administered medications on file  prior to visit.    Medical History:  Past Medical History:  Diagnosis Date  . Arthritis   . CHF (congestive heart failure) ([place])   . COPD (chronic obstructive pulmonary disease) ([place])   . Depression   . Diabetes mellitus type 2 in obese ([place])   . GERD (gastroesophageal reflux disease)   . Hyperlipidemia   . Hypertension   . Morbid obesity ([place])   . OAB (overactive bladder)   . PSVT (paroxysmal supraventricular tachycardia) ([place])   . Thyroid disease   . Vitamin D deficiency    Allergies:  Allergies  Allergen Reactions  . Inderal [Propranolol] Other (See Comments)    Hair loss  . Ace Inhibitors Cough     Review of Systems:  Review of Systems  Constitutional: Negative for chills, fever and malaise/fatigue.  HENT: Negative for congestion, ear pain and sore throat.   Eyes: Negative.   Respiratory: Negative for cough, shortness of breath and wheezing.   Cardiovascular: Negative for chest pain, palpitations and leg swelling.  Gastrointestinal: Negative for abdominal pain, blood in stool, constipation, diarrhea, heartburn and melena.  Genitourinary: Negative.   Skin: Negative.   Neurological: Negative for dizziness, sensory change, loss of consciousness and headaches.  Psychiatric/Behavioral: Negative for depression,  hallucinations, memory loss, substance abuse and suicidal ideas. The patient is nervous/anxious. The patient does not have insomnia.     Family history- Review and unchanged  Social history- Review and unchanged  Physical Exam: There were no vitals taken for this visit. Wt Readings from Last 3 Encounters:  04/03/17 243 lb (110.2 kg)  03/20/17 249 lb 6.4 oz (113.1 kg)  02/11/17 248 lb 6.4 oz (112.7 kg)   General appearance: alert, no distress, WD/WN,  female HEENT: normocephalic, sclerae anicteric, TMs pearly, nares patent, no discharge or erythema, pharynx normal Oral cavity: MMM, no lesions Neck: supple, no lymphadenopathy, no thyromegaly, no masses Heart: Irreg irreg, normal S1, S2, systolic murmur Lungs: decreased breath sounds, no wheezing no rhonchi, no rales, 5 L O2  Abdomen: +bs, soft, non tender, non distended, no masses, no hepatomegaly, no splenomegaly Musculoskeletal: nontender, no swelling, no obvious deformity Extremities: no edema at this time, no cyanosis, no clubbing Pulses: 2+ symmetric, upper and lower extremities, normal cap refill Rectal exam: negative without mass, lesions or tenderness, external hemorrhoids noted, stool guaiac negative. Neurological: alert, oriented x 3, CN2-12 intact, strength normal upper extremities and lower extremities,  DTRs 2+ throughout, no cerebellar signs, patient in wheelchair due to SOB Psychiatric: normal affect, behavior normal, patient crying and clearly upset about her husband richard in the hospital.    [name], PA-C 1:40 PM [facility]

## 2017-04-24 ENCOUNTER — Ambulatory Visit: Payer: Self-pay | Admitting: Physician Assistant

## 2017-04-29 ENCOUNTER — Other Ambulatory Visit: Payer: Self-pay | Admitting: *Deleted

## 2017-04-29 ENCOUNTER — Ambulatory Visit (INDEPENDENT_AMBULATORY_CARE_PROVIDER_SITE_OTHER): Payer: PPO | Admitting: Internal Medicine

## 2017-04-29 ENCOUNTER — Encounter: Payer: Self-pay | Admitting: Internal Medicine

## 2017-04-29 VITALS — BP 116/80 | HR 80 | Temp 97.4°F | Resp 18 | Ht 65.0 in | Wt 250.0 lb

## 2017-04-29 DIAGNOSIS — Z79899 Other long term (current) drug therapy: Secondary | ICD-10-CM | POA: Diagnosis not present

## 2017-04-29 DIAGNOSIS — J449 Chronic obstructive pulmonary disease, unspecified: Secondary | ICD-10-CM

## 2017-04-29 DIAGNOSIS — I10 Essential (primary) hypertension: Secondary | ICD-10-CM | POA: Diagnosis not present

## 2017-04-29 DIAGNOSIS — I480 Paroxysmal atrial fibrillation: Secondary | ICD-10-CM

## 2017-04-29 DIAGNOSIS — Z7901 Long term (current) use of anticoagulants: Secondary | ICD-10-CM

## 2017-04-29 DIAGNOSIS — E1122 Type 2 diabetes mellitus with diabetic chronic kidney disease: Secondary | ICD-10-CM

## 2017-04-29 DIAGNOSIS — N183 Chronic kidney disease, stage 3 (moderate): Secondary | ICD-10-CM

## 2017-04-29 LAB — CBC WITH DIFFERENTIAL/PLATELET
BASOS ABS: 0 {cells}/uL (ref 0–200)
Basophils Relative: 0 %
Eosinophils Absolute: 87 cells/uL (ref 15–500)
Eosinophils Relative: 1 %
HEMATOCRIT: 35.2 % (ref 35.0–45.0)
HEMOGLOBIN: 10.7 g/dL — AB (ref 11.7–15.5)
Lymphocytes Relative: 11 %
Lymphs Abs: 957 cells/uL (ref 850–3900)
MCH: 25.7 pg — ABNORMAL LOW (ref 27.0–33.0)
MCHC: 30.4 g/dL — ABNORMAL LOW (ref 32.0–36.0)
MCV: 84.6 fL (ref 80.0–100.0)
MPV: 9.1 fL (ref 7.5–12.5)
Monocytes Absolute: 870 cells/uL (ref 200–950)
Monocytes Relative: 10 %
Neutro Abs: 6786 cells/uL (ref 1500–7800)
Neutrophils Relative %: 78 %
Platelets: 241 10*3/uL (ref 140–400)
RBC: 4.16 MIL/uL (ref 3.80–5.10)
RDW: 15.8 % — AB (ref 11.0–15.0)
WBC: 8.7 10*3/uL (ref 3.8–10.8)

## 2017-04-29 MED ORDER — METFORMIN HCL ER 500 MG PO TB24: ORAL_TABLET | ORAL | 1 refills | Status: DC

## 2017-04-29 NOTE — Patient Instructions (Signed)
Bleeding Precautions When on Anticoagulant Therapy  WHAT IS ANTICOAGULANT THERAPY?  Anticoagulant therapy is taking medicine to prevent or reduce blood clots. It is also called blood thinner therapy. Blood clots that form in your blood vessels can be dangerous. They can break loose and travel to your heart, lungs, or brain. This increases your risk of a heart attack or stroke. Anticoagulant therapy causes blood to clot more slowly.  You may need anticoagulant therapy if you have:   A medical condition that increases the likelihood that blood clots will form.   A heart defect or a problem with heart rhythm.  It is also a common treatment after heart surgery, such as valve replacement.  WHAT ARE COMMON TYPES OF ANTICOAGULANT THERAPY?  Anticoagulant medicine can be injected or taken by mouth.If you need anticoagulant therapy quickly at the hospital, the medicine may be injected under your skin or given through an IV tube. Heparin is a common example of an anticoagulant that you may get at the hospital.  Most anticoagulant therapy is in the form of pills that you take at home every day. These may include:   Aspirin. This common blood thinner works by preventing blood cells (platelets) from sticking together to form a clot. Aspirin is not as strong as anticoagulants that slow down the time that it takes for your body to form a clot.   Clopidogrel. This is a newer type of drug that affects platelets. It is stronger than aspirin.   Warfarin. This is the most common anticoagulant. It changes the way your body uses vitamin K, a vitamin that helps your blood to clot. The risk of bleeding is higher with warfarin than with aspirin. You will need frequent blood tests to make sure you are taking the safest amount.   New anticoagulants. Several new drugs have been approved. They are all taken by mouth. Studies show that these drugs work as well as warfarin. They do not require blood testing. They may cause less bleeding  risk than warfarin.  WHAT DO I NEED TO REMEMBER WHEN TAKING ANTICOAGULANT THERAPY?  Anticoagulant therapy decreases your risk of forming a blood clot, but it increases your risk of bleeding. Work closely with your health care provider to make sure you are taking your medicine safely. These tips can help:   Learn ways to reduce your risk of bleeding.   If you are taking warfarin:  ? Have blood tests as ordered by your health care provider.  ? Do not make any sudden changes to your diet. Vitamin K in your diet can make warfarin less effective.  ? Do not get pregnant. This medicine may cause birth defects.   Take your medicine at the same time every day. If you forget to take your medicine, take it as soon as you remember. If you miss a whole day, do not double your dose of medicine. Take your normal dose and call your health care provider to check in.   Do not stop taking your medicine on your own.   Tell your health care provider before you start taking any new medicine, vitamin, or herbal product. Some of these could interfere with your therapy.   Tell all of your health care providers that you are on anticoagulant therapy.   Do not have surgery, medical procedures, or dental work until you tell your health care provider that you are on anticoagulant therapy.  WHAT CAN AFFECT HOW ANTICOAGULANTS WORK?  Certain foods, vitamins, medicines, supplements, and herbal   medicines change the way that anticoagulant therapy works. They may increase or decrease the effects of your anticoagulant therapy. Either result can be dangerous for you.   Many over-the-counter medicines for pain, colds, or stomach problems interfere with anticoagulant therapy. Take these only as told by your health care provider.   Do not drink alcohol. It can interfere with your medicine and increase your risk of an injury that causes bleeding.   If you are taking warfarin, do not begin eating more foods that contain vitamin K. These include  leafy green vegetables. Ask your health care provider if you should avoid any foods.  WHAT ARE SOME WAYS TO PREVENT BLEEDING?  You can prevent bleeding by taking certain precautions:   Be extra careful when you use knives, scissors, or other sharp objects.   Use an electric razor instead of a blade.   Do not use toothpicks.   Use a soft toothbrush.   Wear shoes that have nonskid soles.   Use bath mats and handrails in your bathroom.   Wear gloves while you do yard work.   Wear a helmet when you ride a bike.   Wear your seat belt.   Prevent falls by removing loose rugs and extension cords from areas where you walk.   Do not play contact sports or participate in other activities that have a high risk of injury.  WHEN SHOULD I CONTACT MY HEALTH CARE PROVIDER?  Call your health care provider if:   You miss a dose of medicine:  ? And you are not sure what to do.  ? For more than one day.   You have:  ? Menstrual bleeding that is heavier than normal.  ? Blood in your urine.  ? A bloody nose or bleeding gums.  ? Easy bruising.  ? Blood in your stool (feces) or have black and tarry stool.  ? Side effects from your medicine.   You feel weak or dizzy.   You become pregnant.  Seek immediate medical care if:   You have bleeding that will not stop.   You have sudden and severe headache or belly pain.   You vomit or you cough up bright red blood.   You have a severe blow to your head.  WHAT ARE SOME QUESTIONS TO ASK MY HEALTH CARE PROVIDER?   What is the best anticoagulant therapy for my condition?   What side effects should I watch for?   When should I take my medicine? What should I do if I forget to take it?   Will I need to have regular blood tests?   Do I need to change my diet? Are there foods or drinks that I should avoid?   What activities are safe for me?   What should I do if I want to get pregnant?  This information is not intended to replace advice given to you by your health care provider.  Make sure you discuss any questions you have with your health care provider.  Document Released: 08/14/2015 Document Reviewed: 08/14/2015  Elsevier Interactive Patient Education  2017 [employer] Inc[employer]

## 2017-04-29 NOTE — Progress Notes (Signed)
Subjective:    Patient ID: Jillian Wood, female    DOB: 1946/09/11, 71 y.o.   MRN: [id number]  HPI  This nice 71 yo recently widowed WF  (in July) with multiple co-morbidities - HTN, HLD, ASHD/pAfib, ES  O2 dependent COPD/OSA/CPAP, Morbid Obesity (BMI 41+), T2_DM, Hypothyroidism, GERD  and Vitamin D Deficiency. presents for coag monitoring on Coumadin for pAfib. She denies andy bleeding or bruising. Patient is mobility limited to a W/C and had CO2 Retention and is on 5 lit/min O2 with peak O2 target at 91-92 %sat to avoid CO2 narcosis.   Medication Sig  . Acetaminophen (TYLENOL 8 HOUR ARTHRITIS PAIN PO) Take 1 tablet by mouth. PRN  . acetaminophen-codeine (TYLENOL #3) 300-30 MG tablet Take 1 tablet by mouth every 8 (eight) hours as needed for moderate pain or severe pain.  Marland Kitchen acetaZOLAMIDE (DIAMOX) 250 MG tablet TAKE 1 TABLET (250 MG TOTAL) BY MOUTH 3 (THREE) TIMES DAILY.  Marland Kitchen albuterol (PROVENTIL HFA;VENTOLIN HFA) 108 (90 Base) MCG/ACT inhaler Inhale 1-2 puffs into the lungs every 6 (six) hours as needed for wheezing or shortness of breath.  Marland Kitchen aspirin 81 MG tablet Take 81 mg by mouth daily.  . bisoprolol (ZEBETA) 10 MG tablet Take 1 tablet (10 mg total) by mouth daily.  . budesonide (PULMICORT) 0.5 MG/2ML nebulizer solution Take 2 mLs (0.5 mg total) by nebulization 2 (two) times daily.  . budesonide-formoterol (SYMBICORT) 160-4.5 MCG/ACT inhaler Inhale 2 puffs into the lungs 2 (two) times daily.  . Cholecalciferol (VITAMIN D PO) Take 2,000 Units by mouth daily.   . diazepam (VALIUM) 5 MG tablet Take one tablet up to 2 x a daily for anxiety AS NEEDED  . diltiazem (CARDIZEM CD) 300 MG 24 hr capsule TAKE 1 CAPSULE (300 MG TOTAL) BY MOUTH DAILY.  . DULoxetine (CYMBALTA) 60 MG capsule TAKE 1 CAPSULE BY MOUTH DAILY  . famotidine (PEPCID) 20 MG tablet TAKE 1 TABLET (20 MG TOTAL) BY MOUTH 2 (TWO) TIMES DAILY. FOR ACID REFLUX  . ferrous sulfate 325 (65 FE) MG tablet Take 325 mg by mouth 2 (two) times  daily with a meal.  . fluticasone (FLONASE) 50 MCG/ACT nasal spray Place 2 sprays into both nostrils daily. (Patient taking differently: Place 2 sprays into both nostrils daily as needed for allergies. )  . furosemide (LASIX) 80 MG tablet TAKE 1 TABLET (80 MG TOTAL) BY MOUTH DAILY.  Marland Kitchen guaiFENesin (MUCINEX) 600 MG 12 hr tablet Take 600 mg by mouth 2 (two) times daily.   Marland Kitchen ipratropium-albuterol (DUONEB) 0.5-2.5 (3) MG/3ML SOLN Inhale 3 ml by Nebulizer 4 x / day  . levothyroxine (SYNTHROID, LEVOTHROID) 175 MCG tablet TAKE 1 TABLET (175 MCG TOTAL) BY MOUTH DAILY BEFORE BREAKFAST.  Marland Kitchen loratadine-pseudoephedrine (CLARITIN-D 24-HOUR) 10-240 MG per 24 hr tablet Take 1 tablet by mouth as needed for allergies.   Marland Kitchen losartan (COZAAR) 50 MG tablet TAKE 1 TABLET BY MOUTH DAILY  . Magnesium Chloride (MAGNESIUM DR PO) Take 1 capsule by mouth daily.   . OXYGEN-HELIUM IN Inhale 5-6 L into the lungs continuous.   . pravastatin (PRAVACHOL) 40 MG tablet TAKE 1 TABLET (40 MG TOTAL) BY MOUTH DAILY.  Marland Kitchen warfarin (COUMADIN) 5 MG tablet Take 1 to 1.5 tablets daily or as directed. (Patient taking differently: Take 5-7.5 mg by mouth daily at 6 PM. TAKES 7.5MG  ON TUES AND THURS TAKES 5MG  ALL OTHER DAYS)  . metFORMIN (GLUCOPHAGE-XR) 500 MG 24 hr tablet TAKE 1 TABLET (500 MG TOTAL) BY MOUTH 4 (  FOUR) TIMES DAILY - AFTER MEALS AND AT BEDTIME.   No facility-administered medications prior to visit.    Allergies  Allergen Reactions  . Inderal [Propranolol] Other (See Comments)    Hair loss  . Ace Inhibitors Cough   Past Medical History:  Diagnosis Date  . Arthritis   . CHF (congestive heart failure) ([place])   . COPD (chronic obstructive pulmonary disease) ([place])   . Depression   . Diabetes mellitus type 2 in obese ([place])   . GERD (gastroesophageal reflux disease)   . Hyperlipidemia   . Hypertension   . Morbid obesity ([place])   . OAB (overactive bladder)   . PSVT (paroxysmal supraventricular tachycardia) ([place])   . Thyroid  disease   . Vitamin D deficiency    Past Surgical History:  Procedure Laterality Date  . CARPAL TUNNEL RELEASE     L 1992 R 1984  . EYE SURGERY Bilateral    cataract  . NM MYOVIEW LTD  01/2011   Dobutamine Myoview: Negative perfusion scan for ischemia / infarct (poor image capture); Pt developed SVT with Dobutamine that reproduced her CP as it resolved with restoration of NSR.  Marland Kitchen PUBOVAGINAL SLING    . TEE WITHOUT CARDIOVERSION N/A 12/16/2016   Procedure: TRANSESOPHAGEAL ECHOCARDIOGRAM (TEE);  Surgeon: Sanda Klein, MD;  Location: [facility]COPY;  Service: Cardiovascular;  Laterality: N/A;  . TONSILLECTOMY AND ADENOIDECTOMY    . TRANSTHORACIC ECHOCARDIOGRAM  01/2011   Hyperdynamic LV with EF 65-70%, Gr 1 DD, Mild Ao Stenosis - mean gradient ~19 mmHg   Review of Systems  10 point systems review negative except as above.    Objective:   Physical Exam  BP 116/80   Pulse 80   Temp (!) 97.4 F (36.3 C)   Resp 18   Ht 5\' 5"  (1.651 m)   Wt 250 lb (113.4 kg)   BMI 41.60 kg/m    Over nourished in a W/C and in no distress.  Eyes: PERRLA, EOMs, conjunctiva no swelling or erythema. Sinuses: No frontal/maxillary tenderness ENT/Mouth: EAC's clear, TM's nl w/o erythema, bulging. Nares clear w/o erythema, swelling, exudates. Oropharynx clear without erythema or exudates. Oral hygiene is good. Tongue normal, non obstructing. Hearing intact.  Neck: Supple. Thyroid nl. Car 2+/2+ without bruits, nodes or JVD. Chest: Increased AP, Respirations very distant w/o rales, wheezing or stridor.  Cor: Heart sounds very distant w/ regular rate and rhythm without sig. murmurs. Pedal pulses obscured by ankle/pedal edema. Abdomen: Soft & benign. Musculoskeletal:  Generalized decrease in muscle power, tone, bulk and patient is in a wheelchair consequent of deconditioning.   Skin: Warm, dry without exposed rashes, lesions or ecchymosis apparent.  Neuro: Cranial nerves intact, reflexes equal bilaterally.  Sensory-motor testing grossly intact. Tendon reflexes grossly intact.  Pysch: Alert & oriented x 3.  Insight and judgement nl & appropriate. No ideations    Assessment & Plan:   1. Essential hypertension  - CBC with Differential/Platelet - BASIC METABOLIC PANEL WITH GFR  2. Type 2 diabetes mellitus with stage 3 chronic kidney disease, without long-term current use of insulin (HCC)  - BASIC METABOLIC PANEL WITH GFR  3. Paroxysmal A-fib (HCC)  - Protime-INR  4. End stage COPD ([place])   5. Current use of long term anticoagulation  - Protime-INR  6. Medication management  - CBC with Differential/Platelet - BASIC METABOLIC PANEL WITH GFR

## 2017-04-30 LAB — BASIC METABOLIC PANEL WITH GFR
BUN: 18 mg/dL (ref 7–25)
CHLORIDE: 100 mmol/L (ref 98–110)
CO2: 26 mmol/L (ref 20–32)
Calcium: 9 mg/dL (ref 8.6–10.4)
Creat: 0.77 mg/dL (ref 0.60–0.93)
GFR, EST NON AFRICAN AMERICAN: 78 mL/min (ref >=60)
GLUCOSE: 119 mg/dL — AB (ref 65–99)
POTASSIUM: 4.1 mmol/L (ref 3.5–5.3)
Sodium: 140 mmol/L (ref 135–146)

## 2017-04-30 LAB — PROTIME-INR
INR: 2.5 — ABNORMAL HIGH
Prothrombin Time: 26.6 s — ABNORMAL HIGH (ref 9.0–11.5)

## 2017-05-05 ENCOUNTER — Ambulatory Visit: Payer: PPO | Admitting: Pulmonary Disease

## 2017-05-11 DIAGNOSIS — J9610 Chronic respiratory failure, unspecified whether with hypoxia or hypercapnia: Secondary | ICD-10-CM | POA: Diagnosis not present

## 2017-05-11 DIAGNOSIS — J449 Chronic obstructive pulmonary disease, unspecified: Secondary | ICD-10-CM | POA: Diagnosis not present

## 2017-05-14 DIAGNOSIS — J449 Chronic obstructive pulmonary disease, unspecified: Secondary | ICD-10-CM | POA: Diagnosis not present

## 2017-05-16 ENCOUNTER — Ambulatory Visit: Payer: PPO | Admitting: Physician Assistant

## 2017-05-19 DIAGNOSIS — J449 Chronic obstructive pulmonary disease, unspecified: Secondary | ICD-10-CM | POA: Diagnosis not present

## 2017-05-20 ENCOUNTER — Ambulatory Visit: Payer: PPO | Admitting: Physician Assistant

## 2017-05-22 NOTE — Progress Notes (Signed)
CANCELED

## 2017-05-23 ENCOUNTER — Ambulatory Visit: Payer: Self-pay | Admitting: Internal Medicine

## 2017-05-23 ENCOUNTER — Other Ambulatory Visit: Payer: Self-pay | Admitting: Internal Medicine

## 2017-05-25 NOTE — Progress Notes (Signed)
Assessment and Plan:  Hypertension:  -Continue medication -monitor blood pressure at home. -Continue DASH diet -Reminder to go to the ER if any CP, SOB, nausea, dizziness, severe HA, changes vision/speech, left arm numbness and tingling and jaw pain.  Cholesterol - Continue diet and exercise -Check cholesterol.   Diabetes with diabetic chronic kidney disease  -well controlled with current medications -Continue diet and exercise.  -Check A1C  Vitamin D Def -check level -continue medications.   COPD oxygen dependent -continue O2, continue follow up pulmonary  Hypothyroidism -tsh -cont levothyroxine  Acute on chronic diastolic CHF (congestive heart failure) ([place])  continue to decrease weight and monitor at home -     BASIC METABOLIC PANEL WITH GFR  Atrial flutter with rapid ventricular response (HCC) monitor labs, rate controlled -     Protime-INR  Pulmonary hypertension ([place]) Stressed importance of using bipap  Morbid Obesity with co morbidities - long discussion about weight loss, diet, and exercise  Post-menopausal bleeding Continue follow up Dr. Radene Knee  Continue diet and meds as discussed. Further disposition pending results of labs. Discussed med's effects and SE's.    HPI 71 y.o. female  presents for 3 month follow up with hypertension, hyperlipidemia, diabetes and vitamin D deficiency.    Her blood pressure has been controlled at home, today their BP is BP: 138/90.She does not workout. She denies chest pain, shortness of breath, dizziness.  Patient has severe end stage COPD with CO2 retention, she is on 6 L (normally on 5L) of O2 with O2 at 91% depending on activity level, higher with moving around and on diamox for CO2 retention,. She is in a wheelchair.  She is unable to do house work due to her COPD.Suppose to get sleep study on the 24th, need to reschedule, she is wearing her bipap still.  Getting further imaging with Dr. Radene Knee.  She has Afib is on  coumadin, not changed 7.5mg  2 days and 5 mg 5 days.   Lab Results  Component Value Date   INR 2.5 (H) 04/29/2017   INR 2.1 (H) 04/03/2017   INR 1.9 (H) 03/21/2017     She is on cholesterol medication and denies myalgias. Her cholesterol is at goal. The cholesterol was:  02/11/2017: Cholesterol 177; HDL 59; LDL Cholesterol 80; Triglycerides 191   She has been working on diet and exercise for diabetes with diabetic chronic kidney disease, she is on bASA, she is on ACE/ARB, and denies  foot ulcerations, hyperglycemia, hypoglycemia , increased appetite, nausea, paresthesia of the feet, polydipsia, polyuria, visual disturbances, vomiting and weight loss. Last A1C was: 02/11/2017: Hgb A1c MFr Bld 6.5   Patient is on Vitamin D supplement. 02/11/2017: Vit D, 25-Hydroxy 68   She is on thyroid medication. Her medication was not changed last visit.   Lab Results  Component Value Date   TSH 0.69 03/20/2017   BMI is Body mass index is 41.77 kg/m., she is working on diet and exercise. Weight is stable Wt Readings from Last 3 Encounters:  05/26/17 251 lb (113.9 kg)  04/29/17 250 lb (113.4 kg)  04/03/17 243 lb (110.2 kg)     Current Medications:  Current Outpatient Prescriptions on File Prior to Visit  Medication Sig Dispense Refill  . Acetaminophen (TYLENOL 8 HOUR ARTHRITIS PAIN PO) Take 1 tablet by mouth. PRN    . acetaminophen-codeine (TYLENOL #3) 300-30 MG tablet Take 1 tablet by mouth every 8 (eight) hours as needed for moderate pain or severe pain. 90 tablet 0  .  acetaZOLAMIDE (DIAMOX) 250 MG tablet TAKE 1 TABLET (250 MG TOTAL) BY MOUTH 3 (THREE) TIMES DAILY. 270 tablet 1  . albuterol (PROVENTIL HFA;VENTOLIN HFA) 108 (90 Base) MCG/ACT inhaler Inhale 1-2 puffs into the lungs every 6 (six) hours as needed for wheezing or shortness of breath. 18 g 2  . aspirin 81 MG tablet Take 81 mg by mouth daily.    . bisoprolol (ZEBETA) 10 MG tablet Take 1 tablet (10 mg total) by mouth daily. 90 tablet 1  .  budesonide (PULMICORT) 0.5 MG/2ML nebulizer solution Take 2 mLs (0.5 mg total) by nebulization 2 (two) times daily. 360 mL 3  . budesonide-formoterol (SYMBICORT) 160-4.5 MCG/ACT inhaler Inhale 2 puffs into the lungs 2 (two) times daily. 1 Inhaler 0  . Cholecalciferol (VITAMIN D PO) Take 2,000 Units by mouth daily.     . diazepam (VALIUM) 5 MG tablet Take one tablet up to 2 x a daily for anxiety AS NEEDED 60 tablet 0  . diltiazem (CARDIZEM CD) 300 MG 24 hr capsule TAKE 1 CAPSULE (300 MG TOTAL) BY MOUTH DAILY. 90 capsule 1  . DULoxetine (CYMBALTA) 60 MG capsule TAKE 1 CAPSULE BY MOUTH DAILY 90 capsule 1  . famotidine (PEPCID) 20 MG tablet TAKE 1 TABLET (20 MG TOTAL) BY MOUTH 2 (TWO) TIMES DAILY. FOR ACID REFLUX 180 tablet 1  . ferrous sulfate 325 (65 FE) MG tablet Take 325 mg by mouth 2 (two) times daily with a meal.    . fluticasone (FLONASE) 50 MCG/ACT nasal spray Place 2 sprays into both nostrils daily. (Patient taking differently: Place 2 sprays into both nostrils daily as needed for allergies. ) 16 g 0  . furosemide (LASIX) 80 MG tablet TAKE 1 TABLET (80 MG TOTAL) BY MOUTH DAILY. 90 tablet 0  . guaiFENesin (MUCINEX) 600 MG 12 hr tablet Take 600 mg by mouth 2 (two) times daily.     Marland Kitchen ipratropium-albuterol (DUONEB) 0.5-2.5 (3) MG/3ML SOLN Inhale 3 ml by Nebulizer 4 x / day 1080 mL 3  . levothyroxine (SYNTHROID, LEVOTHROID) 175 MCG tablet TAKE 1 TABLET (175 MCG TOTAL) BY MOUTH DAILY BEFORE BREAKFAST. 90 tablet 1  . loratadine-pseudoephedrine (CLARITIN-D 24-HOUR) 10-240 MG per 24 hr tablet Take 1 tablet by mouth as needed for allergies.     Marland Kitchen losartan (COZAAR) 50 MG tablet TAKE 1 TABLET BY MOUTH DAILY 90 tablet 2  . Magnesium Chloride (MAGNESIUM DR PO) Take 1 capsule by mouth daily.     . metFORMIN (GLUCOPHAGE-XR) 500 MG 24 hr tablet TAKE 1 TABLET (500 MG TOTAL) BY MOUTH 4 (FOUR) TIMES DAILY - AFTER MEALS AND AT BEDTIME. 360 tablet 1  . OXYGEN-HELIUM IN Inhale 5-6 L into the lungs continuous.     .  pravastatin (PRAVACHOL) 40 MG tablet TAKE 1 TABLET (40 MG TOTAL) BY MOUTH DAILY. 90 tablet 1  . warfarin (COUMADIN) 5 MG tablet Take 1 to 1.5 tablets daily or as directed. (Patient taking differently: Take 5-7.5 mg by mouth daily at 6 PM. TAKES 7.5MG  ON TUES AND THURS TAKES 5MG  ALL OTHER DAYS) 90 tablet 1   No current facility-administered medications on file prior to visit.    Medical History:  Past Medical History:  Diagnosis Date  . Arthritis   . CHF (congestive heart failure) ([place])   . COPD (chronic obstructive pulmonary disease) ([place])   . Depression   . Diabetes mellitus type 2 in obese ([place])   . GERD (gastroesophageal reflux disease)   . Hyperlipidemia   .  Hypertension   . Morbid obesity ([place])   . OAB (overactive bladder)   . PSVT (paroxysmal supraventricular tachycardia) ([place])   . Thyroid disease   . Vitamin D deficiency    Allergies:  Allergies  Allergen Reactions  . Inderal [Propranolol] Other (See Comments)    Hair loss  . Ace Inhibitors Cough     Review of Systems:  Review of Systems  Constitutional: Negative for chills, fever and malaise/fatigue.  HENT: Negative for congestion, ear pain and sore throat.   Eyes: Negative.   Respiratory: Negative for cough, shortness of breath and wheezing.   Cardiovascular: Negative for chest pain, palpitations and leg swelling.  Gastrointestinal: Negative for abdominal pain, blood in stool, constipation, diarrhea, heartburn and melena.  Genitourinary: Negative.   Skin: Negative.   Neurological: Negative for dizziness, sensory change, loss of consciousness and headaches.  Psychiatric/Behavioral: Negative for depression. The patient is not nervous/anxious and does not have insomnia.     Family history- Review and unchanged  Social history- Review and unchanged  Physical Exam: BP 138/90   Pulse 100   Ht 5\' 5"  (1.651 m)   Wt 251 lb (113.9 kg)   SpO2 (!) 80%   BMI 41.77 kg/m  Wt Readings from Last 3 Encounters:   05/26/17 251 lb (113.9 kg)  04/29/17 250 lb (113.4 kg)  04/03/17 243 lb (110.2 kg)   General appearance: alert, no distress, WD/WN,  female HEENT: normocephalic, sclerae anicteric, TMs pearly, nares patent, no discharge or erythema, pharynx normal Oral cavity: MMM, no lesions Neck: supple, no lymphadenopathy, no thyromegaly, no masses Heart: Irreg irreg, normal S1, S2, systolic murmur Lungs: decreased breath sounds, no wheezing no rhonchi, no rales, 6 L O2  Abdomen: +bs, soft, non tender, non distended, no masses, no hepatomegaly, no splenomegaly Musculoskeletal: nontender, no swelling, no obvious deformity Extremities: no edema at this time, no cyanosis, no clubbing Pulses: 2+ symmetric, upper and lower extremities, normal cap refill Neurological: alert, oriented x 3, CN2-12 intact, strength normal upper extremities and lower extremities,  DTRs 2+ throughout, no cerebellar signs, patient in wheelchair due to SOB Psychiatric: normal affect, behavior normal, pleasant    [name], PA-C 3:56 PM [facility]

## 2017-05-26 ENCOUNTER — Ambulatory Visit (INDEPENDENT_AMBULATORY_CARE_PROVIDER_SITE_OTHER): Payer: PPO | Admitting: Physician Assistant

## 2017-05-26 ENCOUNTER — Encounter: Payer: Self-pay | Admitting: Physician Assistant

## 2017-05-26 VITALS — BP 138/90 | HR 100 | Ht 65.0 in | Wt 251.0 lb

## 2017-05-26 DIAGNOSIS — I480 Paroxysmal atrial fibrillation: Secondary | ICD-10-CM

## 2017-05-26 DIAGNOSIS — E039 Hypothyroidism, unspecified: Secondary | ICD-10-CM

## 2017-05-26 DIAGNOSIS — I272 Pulmonary hypertension, unspecified: Secondary | ICD-10-CM | POA: Diagnosis not present

## 2017-05-26 DIAGNOSIS — E1122 Type 2 diabetes mellitus with diabetic chronic kidney disease: Secondary | ICD-10-CM

## 2017-05-26 DIAGNOSIS — I4892 Unspecified atrial flutter: Secondary | ICD-10-CM | POA: Diagnosis not present

## 2017-05-26 DIAGNOSIS — Z79899 Other long term (current) drug therapy: Secondary | ICD-10-CM

## 2017-05-26 DIAGNOSIS — E782 Mixed hyperlipidemia: Secondary | ICD-10-CM | POA: Diagnosis not present

## 2017-05-26 DIAGNOSIS — I10 Essential (primary) hypertension: Secondary | ICD-10-CM

## 2017-05-26 DIAGNOSIS — N183 Chronic kidney disease, stage 3 unspecified: Secondary | ICD-10-CM

## 2017-05-26 DIAGNOSIS — J449 Chronic obstructive pulmonary disease, unspecified: Secondary | ICD-10-CM | POA: Diagnosis not present

## 2017-05-26 DIAGNOSIS — I5033 Acute on chronic diastolic (congestive) heart failure: Secondary | ICD-10-CM

## 2017-05-26 DIAGNOSIS — N950 Postmenopausal bleeding: Secondary | ICD-10-CM | POA: Diagnosis not present

## 2017-05-26 NOTE — Patient Instructions (Addendum)
   [facility] Address: [address] [place], [place] [address]  Phone: [phone]    Bad carbs also include fruit juice, alcohol, and sweet tea. These are empty calories that do not signal to your brain that you are full.   Please remember the good carbs are still carbs which convert into sugar. So please measure them out no more than 1/2-1 cup of rice, oatmeal, pasta, and beans  Veggies are however free foods! Pile them on.   Not all fruit is created equal. Please see the list below, the fruit at the bottom is higher in sugars than the fruit at the top. Please avoid all dried fruits.      Here is some information to help you keep your heart healthy: Move it! - Aim for 30 mins of activity every day. Take it slowly at first. Talk to [place] before starting any new exercise program.   Lose it.  -Body Mass Index (BMI) can indicate if you need to lose weight. A healthy range is 18.5-24.9. For a BMI calculator, go to [employer]  Waist Management -Excess abdominal fat is a risk factor for heart disease, diabetes, asthma, stroke and more. Ideal waist circumference is less than 35" for women and less than 40" for men.   Eat Right -focus on fruits, vegetables, whole grains, and meals you make yourself. Avoid foods with trans fat and high sugar/sodium content.   Snooze or Snore? - Loud snoring can be a sign of sleep apnea, a significant risk factor for high blood pressure, heart attach, stroke, and heart arrhythmias.  Kick the habit -Quit Smoking! Avoid second hand smoke. A single cigarette raises your blood pressure for 20 mins and increases the risk of heart attack and stroke for the next 24 hours.   Are Aspirin and Supplements right for you? -Add ENTERIC COATED low dose 81 mg Aspirin daily OR can do every other day if you have easy bruising to protect your heart and head. As well as to reduce risk of Colon Cancer by 20 %, Skin Cancer by 26 % , Melanoma by 46% and  Pancreatic cancer by 60%  Say "No to Stress -There may be little you can do about problems that cause stress. However, techniques such as long walks, meditation, and exercise can help you manage it.   Start Now! - Make changes one at a time and set reasonable goals to increase your likelihood of success.

## 2017-05-27 ENCOUNTER — Other Ambulatory Visit: Payer: Self-pay | Admitting: Physician Assistant

## 2017-05-27 DIAGNOSIS — Z79899 Other long term (current) drug therapy: Secondary | ICD-10-CM

## 2017-05-27 LAB — LIPID PANEL
Cholesterol: 139 mg/dL (ref <200)
HDL: 58 mg/dL (ref >50)
LDL CHOLESTEROL (CALC): 60 mg/dL
NON-HDL CHOLESTEROL (CALC): 81 mg/dL (ref <130)
TRIGLYCERIDES: 125 mg/dL (ref <150)
Total CHOL/HDL Ratio: 2.4 (calc) (ref <5.0)

## 2017-05-27 LAB — CBC WITH DIFFERENTIAL/PLATELET
BASOS PCT: 0.4 %
Basophils Absolute: 21 cells/uL (ref 0–200)
EOS PCT: 1.1 %
Eosinophils Absolute: 58 cells/uL (ref 15–500)
HEMATOCRIT: 35.4 % (ref 35.0–45.0)
Hemoglobin: 10.6 g/dL — ABNORMAL LOW (ref 11.7–15.5)
LYMPHS ABS: 1049 {cells}/uL (ref 850–3900)
MCH: 24.9 pg — ABNORMAL LOW (ref 27.0–33.0)
MCHC: 29.9 g/dL — ABNORMAL LOW (ref 32.0–36.0)
MCV: 83.1 fL (ref 80.0–100.0)
MPV: 9.9 fL (ref 7.5–12.5)
Monocytes Relative: 7.7 %
NEUTROS PCT: 71 %
Neutro Abs: 3763 cells/uL (ref 1500–7800)
PLATELETS: 278 10*3/uL (ref 140–400)
RBC: 4.26 10*6/uL (ref 3.80–5.10)
RDW: 15.2 % — AB (ref 11.0–15.0)
Total Lymphocyte: 19.8 %
WBC: 5.3 10*3/uL (ref 3.8–10.8)
WBCMIX: 408 {cells}/uL (ref 200–950)

## 2017-05-27 LAB — BASIC METABOLIC PANEL WITH GFR
BUN/Creatinine Ratio: 20 (calc) (ref 6–22)
BUN: 12 mg/dL (ref 7–25)
CALCIUM: 9.1 mg/dL (ref 8.6–10.4)
CHLORIDE: 102 mmol/L (ref 98–110)
CO2: 35 mmol/L — AB (ref 20–32)
Creat: 0.59 mg/dL — ABNORMAL LOW (ref 0.60–0.93)
GFR, EST AFRICAN AMERICAN: 107 mL/min/{1.73_m2} (ref >=60)
GFR, Est Non African American: 92 mL/min/{1.73_m2} (ref >=60)
Glucose, Bld: 107 mg/dL — ABNORMAL HIGH (ref 65–99)
POTASSIUM: 4.2 mmol/L (ref 3.5–5.3)
Sodium: 143 mmol/L (ref 135–146)

## 2017-05-27 LAB — PROTIME-INR
INR: 5.7 — AB
Prothrombin Time: 59.6 s — ABNORMAL HIGH (ref 9.0–11.5)

## 2017-05-27 LAB — HEPATIC FUNCTION PANEL
AG Ratio: 1.6 (calc) (ref 1.0–2.5)
ALBUMIN MSPROF: 3.9 g/dL (ref 3.6–5.1)
ALT: 13 U/L (ref 6–29)
AST: 13 U/L (ref 10–35)
Alkaline phosphatase (APISO): 93 U/L (ref 33–130)
BILIRUBIN DIRECT: 0.1 mg/dL (ref 0.0–0.2)
BILIRUBIN INDIRECT: 0.3 mg/dL (ref 0.2–1.2)
BILIRUBIN TOTAL: 0.4 mg/dL (ref 0.2–1.2)
GLOBULIN: 2.5 g/dL (ref 1.9–3.7)
Total Protein: 6.4 g/dL (ref 6.1–8.1)

## 2017-05-27 LAB — HEMOGLOBIN A1C
Hgb A1c MFr Bld: 5.7 % of total Hgb — ABNORMAL HIGH (ref <5.7)
Mean Plasma Glucose: 117 (calc)
eAG (mmol/L): 6.5 (calc)

## 2017-05-27 LAB — TSH: TSH: 0.63 mIU/L (ref 0.40–4.50)

## 2017-05-27 LAB — MAGNESIUM: MAGNESIUM: 1.7 mg/dL (ref 1.5–2.5)

## 2017-05-30 ENCOUNTER — Other Ambulatory Visit: Payer: Self-pay

## 2017-05-30 ENCOUNTER — Other Ambulatory Visit: Payer: PPO

## 2017-05-30 DIAGNOSIS — Z79899 Other long term (current) drug therapy: Secondary | ICD-10-CM | POA: Diagnosis not present

## 2017-05-30 LAB — CBC WITH DIFFERENTIAL/PLATELET
BASOS ABS: 30 {cells}/uL (ref 0–200)
Basophils Relative: 0.4 %
EOS PCT: 2 %
Eosinophils Absolute: 148 cells/uL (ref 15–500)
HCT: 34.9 % — ABNORMAL LOW (ref 35.0–45.0)
HEMOGLOBIN: 10.5 g/dL — AB (ref 11.7–15.5)
Lymphs Abs: 1236 cells/uL (ref 850–3900)
MCH: 25.2 pg — ABNORMAL LOW (ref 27.0–33.0)
MCHC: 30.1 g/dL — AB (ref 32.0–36.0)
MCV: 83.7 fL (ref 80.0–100.0)
MONOS PCT: 8.1 %
MPV: 9.7 fL (ref 7.5–12.5)
Neutro Abs: 5387 cells/uL (ref 1500–7800)
Neutrophils Relative %: 72.8 %
PLATELETS: 300 10*3/uL (ref 140–400)
RBC: 4.17 10*6/uL (ref 3.80–5.10)
RDW: 15.2 % — AB (ref 11.0–15.0)
TOTAL LYMPHOCYTE: 16.7 %
WBC mixed population: 599 cells/uL (ref 200–950)
WBC: 7.4 10*3/uL (ref 3.8–10.8)

## 2017-05-30 LAB — BASIC METABOLIC PANEL WITH GFR
BUN: 21 mg/dL (ref 7–25)
CALCIUM: 9.2 mg/dL (ref 8.6–10.4)
CHLORIDE: 102 mmol/L (ref 98–110)
CO2: 34 mmol/L — ABNORMAL HIGH (ref 20–32)
Creat: 0.78 mg/dL (ref 0.60–0.93)
GFR, EST AFRICAN AMERICAN: 89 mL/min/{1.73_m2} (ref >=60)
GFR, Est Non African American: 76 mL/min/{1.73_m2} (ref >=60)
Glucose, Bld: 124 mg/dL — ABNORMAL HIGH (ref 65–99)
POTASSIUM: 4.6 mmol/L (ref 3.5–5.3)
Sodium: 142 mmol/L (ref 135–146)

## 2017-05-30 LAB — PROTIME-INR
INR: 1.9 — AB
Prothrombin Time: 20.1 s — ABNORMAL HIGH (ref 9.0–11.5)

## 2017-05-30 MED ORDER — ALBUTEROL SULFATE HFA 108 (90 BASE) MCG/ACT IN AERS: 1.0000 | INHALATION_SPRAY | Freq: Four times a day (QID) | RESPIRATORY_TRACT | 2 refills | Status: AC | PRN

## 2017-06-04 ENCOUNTER — Other Ambulatory Visit: Payer: Self-pay | Admitting: Physician Assistant

## 2017-06-06 ENCOUNTER — Ambulatory Visit: Payer: PPO | Admitting: Physician Assistant

## 2017-06-11 DIAGNOSIS — J449 Chronic obstructive pulmonary disease, unspecified: Secondary | ICD-10-CM | POA: Diagnosis not present

## 2017-06-11 DIAGNOSIS — J9610 Chronic respiratory failure, unspecified whether with hypoxia or hypercapnia: Secondary | ICD-10-CM | POA: Diagnosis not present

## 2017-06-14 DIAGNOSIS — J449 Chronic obstructive pulmonary disease, unspecified: Secondary | ICD-10-CM | POA: Diagnosis not present

## 2017-06-15 NOTE — Progress Notes (Signed)
Coumadin/chronic medical conditions follow up 71 y/o pleasant Caucasian female with multiple co-morbidities - HTN, HLD, ASHD/pAfib, ES  O2 dependent COPD/OSA/CPAP, Morbid Obesity (BMI 41+), T2_DM, Hypothyroidism, GERD and Vitamin D Deficiency. Patient is on Coumadin for atrial flutter with rapid ventricular response. She is here for a 2-week coumadin follow up after dose change and for monitoring of chronic medical conditions. She is concerned about constipation after initiating an iron supplement today.   Patient's last INR was below goal; Patient denies SOB, CP, dizziness, nose bleeds, easy bleeding, and blood in stool/urine.  Her coumadin dose was changed last visit. She has not taken ABX, has not missed any doses and denies a fall.  Lab Results  Component Value Date   INR 1.9 (H) 05/30/2017   INR 5.7 (H) 05/26/2017   INR 2.5 (H) 04/29/2017  Current coumadin schedule: 5 mg daily but 2.5 mg on Tues/Thurs  BMI is trending up at Body mass index is 42.93 kg/m., she is not currently working on diet/exercise. She has previously lost 99 lb with portion control.  Wt Readings from Last 3 Encounters:  06/16/17 258 lb (117 kg)  05/26/17 251 lb (113.9 kg)  04/29/17 250 lb (113.4 kg)   Patient with documented history of renal disease which appears stable over last several visits- last GFR in office: Lab Results  Component Value Date   [facility] 76 05/30/2017       Current Outpatient Prescriptions on File Prior to Visit  Medication Sig Dispense Refill  . Acetaminophen (TYLENOL 8 HOUR ARTHRITIS PAIN PO) Take 1 tablet by mouth. PRN    . acetaminophen-codeine (TYLENOL #3) 300-30 MG tablet Take 1 tablet by mouth every 8 (eight) hours as needed for moderate pain or severe pain. 90 tablet 0  . acetaZOLAMIDE (DIAMOX) 250 MG tablet TAKE 1 TABLET (250 MG TOTAL) BY MOUTH 3 (THREE) TIMES DAILY. 270 tablet 1  . albuterol (PROVENTIL HFA;VENTOLIN HFA) 108 (90 Base) MCG/ACT inhaler Inhale 1-2 puffs into the  lungs every 6 (six) hours as needed for wheezing or shortness of breath. 18 g 2  . aspirin 81 MG tablet Take 81 mg by mouth daily.    . bisoprolol (ZEBETA) 10 MG tablet Take 1 tablet (10 mg total) by mouth daily. 90 tablet 1  . budesonide (PULMICORT) 0.5 MG/2ML nebulizer solution Take 2 mLs (0.5 mg total) by nebulization 2 (two) times daily. 360 mL 3  . budesonide-formoterol (SYMBICORT) 160-4.5 MCG/ACT inhaler Inhale 2 puffs into the lungs 2 (two) times daily. 1 Inhaler 0  . Cholecalciferol (VITAMIN D PO) Take 2,000 Units by mouth daily.     . diazepam (VALIUM) 5 MG tablet Take one tablet up to 2 x a daily for anxiety AS NEEDED 60 tablet 0  . diltiazem (CARDIZEM CD) 300 MG 24 hr capsule TAKE 1 CAPSULE (300 MG TOTAL) BY MOUTH DAILY. 90 capsule 1  . DULoxetine (CYMBALTA) 60 MG capsule TAKE 1 CAPSULE BY MOUTH DAILY 90 capsule 1  . famotidine (PEPCID) 20 MG tablet TAKE 1 TABLET (20 MG TOTAL) BY MOUTH 2 (TWO) TIMES DAILY. FOR ACID REFLUX 180 tablet 1  . ferrous sulfate 325 (65 FE) MG tablet Take 325 mg by mouth 2 (two) times daily with a meal.    . fluticasone (FLONASE) 50 MCG/ACT nasal spray Place 2 sprays into both nostrils daily. (Patient taking differently: Place 2 sprays into both nostrils daily as needed for allergies. ) 16 g 0  . furosemide (LASIX) 80 MG tablet TAKE 1 TABLET (  80 MG TOTAL) BY MOUTH DAILY. 90 tablet 0  . guaiFENesin (MUCINEX) 600 MG 12 hr tablet Take 600 mg by mouth 2 (two) times daily.     Marland Kitchen ipratropium-albuterol (DUONEB) 0.5-2.5 (3) MG/3ML SOLN Inhale 3 ml by Nebulizer 4 x / day 1080 mL 3  . levothyroxine (SYNTHROID, LEVOTHROID) 175 MCG tablet TAKE 1 TABLET (175 MCG TOTAL) BY MOUTH DAILY BEFORE BREAKFAST. 90 tablet 1  . loratadine-pseudoephedrine (CLARITIN-D 24-HOUR) 10-240 MG per 24 hr tablet Take 1 tablet by mouth as needed for allergies.     Marland Kitchen losartan (COZAAR) 50 MG tablet TAKE 1 TABLET BY MOUTH DAILY 90 tablet 2  . Magnesium Chloride (MAGNESIUM DR PO) Take 1 capsule by mouth  daily.     . metFORMIN (GLUCOPHAGE-XR) 500 MG 24 hr tablet TAKE 1 TABLET (500 MG TOTAL) BY MOUTH 4 (FOUR) TIMES DAILY - AFTER MEALS AND AT BEDTIME. 360 tablet 1  . OXYGEN-HELIUM IN Inhale 5-6 L into the lungs continuous.     . pravastatin (PRAVACHOL) 40 MG tablet TAKE 1 TABLET (40 MG TOTAL) BY MOUTH DAILY. 90 tablet 1  . warfarin (COUMADIN) 5 MG tablet Take 1 to 1.5 tablets daily or as directed. (Patient taking differently: Take 5-7.5 mg by mouth daily at 6 PM. TAKES 7.5MG  ON TUES AND THURS TAKES 5MG  ALL OTHER DAYS) 90 tablet 1   No current facility-administered medications on file prior to visit.    Past Medical History:  Diagnosis Date  . Arthritis   . CHF (congestive heart failure) ([place])   . COPD (chronic obstructive pulmonary disease) ([place])   . Depression   . Diabetes mellitus type 2 in obese ([place])   . GERD (gastroesophageal reflux disease)   . Hyperlipidemia   . Hypertension   . Morbid obesity ([place])   . OAB (overactive bladder)   . PSVT (paroxysmal supraventricular tachycardia) ([place])   . Thyroid disease   . Vitamin D deficiency    Allergies  Allergen Reactions  . Inderal [Propranolol] Other (See Comments)    Hair loss  . Ace Inhibitors Cough    ROS Constitutional: Denies fever, chills, headaches, fatigue. Cardio: Denies chest pain, palpitations, irregular heartbeat, syncope, dyspnea, diaphoresis, orthopnea, PND, claudication, edema Respiratory: denies cough, dyspnea, DOE, pleurisy, hoarseness, laryngitis, wheezing.  Gastrointestinal: Denies dysphagia, heartburn, reflux, pain, cramps, nausea, diarrhea, constipation, hematemesis, melena, hematochezia Genitourinary: Denies dysuria, frequency, hematuria, flank pain Musculoskeletal: Denies arthralgia, myalgia, stiffness, Jt. Swelling, pain, limp, strain/sprain. Skin: Denies rash, ecchymosis, petechial. Neuro: Denies Weakness, tremor, incoordination, spasms, paresthesia, pain Heme/Lymph: Denies Excessive bleeding, bruising,  enlarged lymph nodes  Physical: Blood pressure 128/72, height 5\' 5"  (1.651 m), weight 258 lb (117 kg). Filed Weights   06/16/17 1654  Weight: 258 lb (117 kg)   General Appearance: Well nourished, in no apparent distress. ENT/Mouth: Nares clear with no erythema, swelling, mucus on turbinates. No ulcers, cracking, on lips. No erythema, swelling, or exudate on post pharynx.  Neck: Supple, thyroid normal.  Respiratory: CTAB   Cardio:  Irregular, irregular rhythm, no murmurs, rubs or gallops. No edema  Abdomen: Soft, with bowl sounds. Non tender, no guarding, rebound, hernias, masses, or organomegaly.  Skin: Warm, dry without rashes, lesions, ecchymosis.  Neuro: Unremarkable  Assessment and plan: Chronic anticoagulation/typical atrial flutter- check INR and will adjust medication according to labs.  Discussed if patient falls to immediately contact office or go to ER. Discussed foods that can increase or decrease Coumadin levels. Patient understands to call the office before starting a new  medication. Follow up in one month.   Morbid obesity ([place]) - long discussion about weight loss, diet, and exercise - Provided information about small changes she can make - Patient is not ready at this time to implement changes  Essential hypertension - Continue medication:  - Monitor blood pressure at home, call if greater than 130/80 - Continue DASH diet.   - Reminder to go to the ER if any CP, SOB, nausea, dizziness, severe HA, changes vision/speech, left arm numbness and tingling and jaw pain.  Tobacco abuse - Congratulated patient on approaching 1 year without tobacco  Medication management - Check CBC/BMP  Constipation - Encouraged increased veggies/whole grains, gentle regular exercise - May take metamucil etc fiber supplement, encouraged to drink more water with this - May utilize OTC dulcolax if no BM in 3 days- call the office should it go longer than this   Future  Appointments Date Time Provider [place]  06/19/2017 2:30 PM Barrett, Felisa Bonier [facility]  07/25/2017 12:00 PM Chesley Mires, MD [facility] None  12/16/2017 3:00 PM Unk Pinto, MD [facility] None

## 2017-06-16 ENCOUNTER — Ambulatory Visit (INDEPENDENT_AMBULATORY_CARE_PROVIDER_SITE_OTHER): Payer: PPO | Admitting: Adult Health

## 2017-06-16 ENCOUNTER — Encounter: Payer: Self-pay | Admitting: Adult Health

## 2017-06-16 VITALS — BP 128/72 | Ht 65.0 in | Wt 258.0 lb

## 2017-06-16 DIAGNOSIS — K5900 Constipation, unspecified: Secondary | ICD-10-CM

## 2017-06-16 DIAGNOSIS — Z79899 Other long term (current) drug therapy: Secondary | ICD-10-CM

## 2017-06-16 DIAGNOSIS — N939 Abnormal uterine and vaginal bleeding, unspecified: Secondary | ICD-10-CM | POA: Diagnosis not present

## 2017-06-16 DIAGNOSIS — I483 Typical atrial flutter: Secondary | ICD-10-CM

## 2017-06-16 DIAGNOSIS — Z720 Tobacco use: Secondary | ICD-10-CM | POA: Diagnosis not present

## 2017-06-16 DIAGNOSIS — N950 Postmenopausal bleeding: Secondary | ICD-10-CM | POA: Diagnosis not present

## 2017-06-16 DIAGNOSIS — Z7901 Long term (current) use of anticoagulants: Secondary | ICD-10-CM

## 2017-06-16 DIAGNOSIS — I10 Essential (primary) hypertension: Secondary | ICD-10-CM

## 2017-06-16 NOTE — Patient Instructions (Addendum)
Congratulations on approaching 1 year off of tobacco!  You may take a fiber supplement as we discussed such as metamucil; increase your fruit/veggies/whole grain intake;  be sure that you are drinking plenty of water with this. If you do not have a BM for over three days, you may try a dulcolax which is also available OTC.   We want weight loss that will last so you should lose 1-2 pounds a week.  THAT IS IT! Please pick THREE things a month to change. Once it is a habit check off the item. Then pick another three items off the list to become habits.  If you are already doing a habit on the list GREAT!  Cross that item off! o Don't drink your calories. Ie, alcohol, soda, fruit juice, and sweet tea.  o Drink more water. Drink a glass when you feel hungry or before each meal.  o Eat breakfast - Complex carb and protein (likeDannon light and fit yogurt, oatmeal, fruit, eggs, [place] bacon). o Measure your cereal.  Eat no more than one cup a day. (ie [place]) o Eat an apple a day. o Add a vegetable a day. o Try a new vegetable a month. o Use Pam! Stop using oil or butter to cook. o Don't finish your plate or use smaller plates. o Share your dessert. o Eat sugar free Jello for dessert or frozen grapes. o Don't eat 2-3 hours before bed. o Switch to whole wheat bread, pasta, and brown rice. o Make healthier choices when you eat out. No fries! o Pick baked chicken, NOT fried. o Don't forget to SLOW DOWN when you eat. It is not going anywhere.  o Take the stairs. o Park far away in the parking lot o [employer] (or weights) for 10 minutes while watching TV. o Walk at work for 10 minutes during break. o Walk outside 1 time a week with your friend, kids, dog, or significant other. o Start a walking group at [place] the mall as much as you can tolerate.  o Keep a food diary. o Weigh yourself daily. o Walk for 15 minutes 3 days per week. o Cook at home more often and eat out less.  If life  happens and you go back to old habits, it is okay.  Just start over. You can do it!   If you experience chest pain, get short of breath, or tired during the exercise, please stop immediately and inform your doctor.

## 2017-06-17 ENCOUNTER — Other Ambulatory Visit: Payer: Self-pay | Admitting: Adult Health

## 2017-06-17 ENCOUNTER — Other Ambulatory Visit: Payer: Self-pay | Admitting: Internal Medicine

## 2017-06-17 DIAGNOSIS — Z1231 Encounter for screening mammogram for malignant neoplasm of breast: Secondary | ICD-10-CM

## 2017-06-17 DIAGNOSIS — I483 Typical atrial flutter: Secondary | ICD-10-CM

## 2017-06-17 LAB — CBC WITH DIFFERENTIAL/PLATELET
Basophils Absolute: 22 cells/uL (ref 0–200)
Basophils Relative: 0.4 %
EOS PCT: 0.9 %
Eosinophils Absolute: 50 cells/uL (ref 15–500)
HEMATOCRIT: 34.2 % — AB (ref 35.0–45.0)
HEMOGLOBIN: 10.2 g/dL — AB (ref 11.7–15.5)
LYMPHS ABS: 851 {cells}/uL (ref 850–3900)
MCH: 24.8 pg — ABNORMAL LOW (ref 27.0–33.0)
MCHC: 29.8 g/dL — ABNORMAL LOW (ref 32.0–36.0)
MCV: 83.2 fL (ref 80.0–100.0)
MPV: 9.7 fL (ref 7.5–12.5)
Monocytes Relative: 7.2 %
NEUTROS ABS: 4273 {cells}/uL (ref 1500–7800)
Neutrophils Relative %: 76.3 %
Platelets: 287 10*3/uL (ref 140–400)
RBC: 4.11 10*6/uL (ref 3.80–5.10)
RDW: 14.8 % (ref 11.0–15.0)
Total Lymphocyte: 15.2 %
WBC mixed population: 403 cells/uL (ref 200–950)
WBC: 5.6 10*3/uL (ref 3.8–10.8)

## 2017-06-17 LAB — BASIC METABOLIC PANEL WITH GFR
BUN: 19 mg/dL (ref 7–25)
CALCIUM: 8.9 mg/dL (ref 8.6–10.4)
CO2: 32 mmol/L (ref 20–32)
CREATININE: 0.8 mg/dL (ref 0.60–0.93)
Chloride: 102 mmol/L (ref 98–110)
GFR, EST AFRICAN AMERICAN: 86 mL/min/{1.73_m2} (ref >=60)
GFR, EST NON AFRICAN AMERICAN: 74 mL/min/{1.73_m2} (ref >=60)
Glucose, Bld: 123 mg/dL — ABNORMAL HIGH (ref 65–99)
Potassium: 4.7 mmol/L (ref 3.5–5.3)
Sodium: 142 mmol/L (ref 135–146)

## 2017-06-17 LAB — PROTIME-INR
INR: 1.9 — ABNORMAL HIGH
PROTHROMBIN TIME: 19.9 s — AB (ref 9.0–11.5)

## 2017-06-17 MED ORDER — WARFARIN SODIUM 5 MG PO TABS: 5.0000 mg | ORAL_TABLET | Freq: Every day | ORAL | 2 refills | Status: DC

## 2017-06-18 DIAGNOSIS — J449 Chronic obstructive pulmonary disease, unspecified: Secondary | ICD-10-CM | POA: Diagnosis not present

## 2017-06-19 ENCOUNTER — Other Ambulatory Visit: Payer: Self-pay | Admitting: Internal Medicine

## 2017-06-19 ENCOUNTER — Encounter: Payer: Self-pay | Admitting: Physician Assistant

## 2017-06-19 ENCOUNTER — Ambulatory Visit (INDEPENDENT_AMBULATORY_CARE_PROVIDER_SITE_OTHER): Payer: PPO | Admitting: Physician Assistant

## 2017-06-19 VITALS — BP 136/62 | HR 81 | Ht 62.0 in | Wt 256.0 lb

## 2017-06-19 DIAGNOSIS — J449 Chronic obstructive pulmonary disease, unspecified: Secondary | ICD-10-CM | POA: Diagnosis not present

## 2017-06-19 DIAGNOSIS — I5032 Chronic diastolic (congestive) heart failure: Secondary | ICD-10-CM | POA: Diagnosis not present

## 2017-06-19 DIAGNOSIS — I4892 Unspecified atrial flutter: Secondary | ICD-10-CM

## 2017-06-19 NOTE — Progress Notes (Signed)
Cardiology Office Note   Date:  06/19/2017   ID:  Jillian Wood, DOB [birth year], MRN [id number]  PCP:  Jillian Pinto, MD  Cardiologist:  Dr. Gwenlyn Found, 01/17/2017  Rosaria Ferries, PA-C 10/18/2016   History of Present Illness: Jillian Wood is a 71 y.o. female with a history of - HTN, HLD, ASHD/pAfib, ES O2 dependent COPD/OSA/CPAP, Morbid Obesity (BMI 41+), T2_DM, Hypothyroidism, GERD and Vitamin D Deficiency. Atrial flutter on coumadin  [name] presents for cardiology follow up. She is here today with her son.  Her husband died 3 months ago. She has not been watching her eating very well. She feels she may have gained some weight. She is struggling with his death. They have been married 52 years.  She has not been waking with edema. She gets some LE edema during the day, that is normal.   She feels her breathing is stable. She is on 5-6 lpm O2, has been at this level for about 6 months. She is compliant with BiPAP qhs. Her O2 sats were 80% on arrival, but she had just exerted herself significantly. When she was rechecked, she was 90%. She has not been weighing herself.  She has not had chest pain with exertion. She is not having palpitations. She is not having any bleeding issues on the Coumadin.  She was told she had end-stage COPD and about a year to live. She wants to know if this is true.   Past Medical History:  Diagnosis Date  . Arthritis   . CHF (congestive heart failure) ([place])   . COPD (chronic obstructive pulmonary disease) ([place])   . Depression   . Diabetes mellitus type 2 in obese ([place])   . GERD (gastroesophageal reflux disease)   . Hyperlipidemia   . Hypertension   . Morbid obesity ([place])   . OAB (overactive bladder)   . PSVT (paroxysmal supraventricular tachycardia) ([place])   . Thyroid disease   . Vitamin D deficiency     Past Surgical History:  Procedure Laterality Date  . CARPAL TUNNEL RELEASE     L 1992 R 1984  . EYE SURGERY Bilateral      cataract  . NM MYOVIEW LTD  01/2011   Dobutamine Myoview: Negative perfusion scan for ischemia / infarct (poor image capture); Pt developed SVT with Dobutamine that reproduced her CP as it resolved with restoration of NSR.  Marland Kitchen PUBOVAGINAL SLING    . TEE WITHOUT CARDIOVERSION N/A 12/16/2016   Procedure: TRANSESOPHAGEAL ECHOCARDIOGRAM (TEE);  Surgeon: Sanda Klein, MD;  Location: [facility] ENDOSCOPY;  Service: Cardiovascular;  Laterality: N/A;  . TONSILLECTOMY AND ADENOIDECTOMY    . TRANSTHORACIC ECHOCARDIOGRAM  01/2011   Hyperdynamic LV with EF 65-70%, Gr 1 DD, Mild Ao Stenosis - mean gradient ~19 mmHg    Medication Sig  . Acetaminophen (TYLENOL 8 HOUR ARTHRITIS PAIN PO) Take 1 tablet by mouth. PRN  . acetaminophen-codeine (TYLENOL #3) 300-30 MG tablet Take 1 tablet by mouth every 8 (eight) hours as needed for moderate pain or severe pain.  Marland Kitchen acetaZOLAMIDE (DIAMOX) 250 MG tablet TAKE 1 TABLET (250 MG TOTAL) BY MOUTH 3 (THREE) TIMES DAILY.  Marland Kitchen albuterol (PROVENTIL HFA;VENTOLIN HFA) 108 (90 Base) MCG/ACT inhaler Inhale 1-2 puffs into the lungs every 6 (six) hours as needed for wheezing or shortness of breath.  Marland Kitchen aspirin 81 MG tablet Take 81 mg by mouth daily.  . bisoprolol (ZEBETA) 10 MG tablet TAKE 1 TABLET (10 MG TOTAL) BY MOUTH DAILY.  Marland Kitchen  budesonide (PULMICORT) 0.5 MG/2ML nebulizer solution Take 2 mLs (0.5 mg total) by nebulization 2 (two) times daily.  . budesonide-formoterol (SYMBICORT) 160-4.5 MCG/ACT inhaler Inhale 2 puffs into the lungs 2 (two) times daily.  . Cholecalciferol (VITAMIN D PO) Take 2,000 Units by mouth daily.   . diazepam (VALIUM) 5 MG tablet Take one tablet up to 2 x a daily for anxiety AS NEEDED  . diltiazem (CARDIZEM CD) 300 MG 24 hr capsule TAKE 1 CAPSULE (300 MG TOTAL) BY MOUTH DAILY.  . DULoxetine (CYMBALTA) 60 MG capsule TAKE 1 CAPSULE BY MOUTH DAILY  . famotidine (PEPCID) 20 MG tablet TAKE 1 TABLET (20 MG TOTAL) BY MOUTH 2 (TWO) TIMES DAILY. FOR ACID REFLUX  . ferrous  sulfate 325 (65 FE) MG tablet Take 325 mg by mouth 2 (two) times daily with a meal.  . fluticasone (FLONASE) 50 MCG/ACT nasal spray Place 2 sprays into both nostrils daily. (Patient taking differently: Place 2 sprays into both nostrils daily as needed for allergies. )  . furosemide (LASIX) 80 MG tablet TAKE 1 TABLET (80 MG TOTAL) BY MOUTH DAILY.  Marland Kitchen guaiFENesin (MUCINEX) 600 MG 12 hr tablet Take 600 mg by mouth 2 (two) times daily.   Marland Kitchen ipratropium-albuterol (DUONEB) 0.5-2.5 (3) MG/3ML SOLN Inhale 3 ml by Nebulizer 4 x / day  . levothyroxine (SYNTHROID, LEVOTHROID) 175 MCG tablet TAKE 1 TABLET (175 MCG TOTAL) BY MOUTH DAILY BEFORE BREAKFAST.  Marland Kitchen loratadine-pseudoephedrine (CLARITIN-D 24-HOUR) 10-240 MG per 24 hr tablet Take 1 tablet by mouth as needed for allergies.   Marland Kitchen losartan (COZAAR) 50 MG tablet TAKE 1 TABLET BY MOUTH DAILY  . Magnesium Chloride (MAGNESIUM DR PO) Take 1 capsule by mouth daily.   . metFORMIN (GLUCOPHAGE-XR) 500 MG 24 hr tablet TAKE 1 TABLET (500 MG TOTAL) BY MOUTH 4 (FOUR) TIMES DAILY - AFTER MEALS AND AT BEDTIME.  Marland Kitchen OXYGEN-HELIUM IN Inhale 5-6 L into the lungs continuous.   . pravastatin (PRAVACHOL) 40 MG tablet TAKE 1 TABLET (40 MG TOTAL) BY MOUTH DAILY.  Marland Kitchen warfarin (COUMADIN) 5 MG tablet Take 1-1.5 tablets (5-7.5 mg total) by mouth daily at 6 PM. TAKES 7.5MG  ON TUES, THURS, SUNDAY TAKES 5MG  ALL OTHER DAYS    Allergies:   Inderal [propranolol] and Ace inhibitors    Social History:  The patient  reports that she quit smoking about 10 months ago. Her smoking use included Cigarettes. She has a 22.00 pack-year smoking history. She has never used smokeless tobacco. She reports that she does not drink alcohol or use drugs.   Family History:  The patient's family history includes Alcohol abuse in her father; Heart disease in her father.    ROS:  Please see the history of present illness. All other systems are reviewed and negative.    PHYSICAL EXAM: VS:  BP 136/62   Pulse 81    Ht 5\' 2"  (1.575 m)   Wt 256 lb (116.1 kg)   SpO2 (!) 80%   BMI 46.82 kg/m  , BMI Body mass index is 46.82 kg/m. GEN: Well nourished, Chronically ill appearing, female in no acute distress  HEENT: normal for age  Neck: JVD 8 cm, no carotid bruit, no masses Cardiac: sliighty Irreg R&R; soft murmur, no rubs, or gallops Respiratory: Decreased breath sounds bases with a few scattered rales bilaterally, normal work of breathing GI: soft, nontender, nondistended, + BS MS: no deformity or atrophy; no edema; distal pulses are 2+ in all 4 extremities   Skin: warm and dry,  no rash Neuro:  Strength and sensation are intact Psych: euthymic mood, full affect   EKG:  EKG is not ordered today.  Recent Labs: 12/09/2016: B Natriuretic Peptide 472.3 05/26/2017: ALT 13; Magnesium 1.7; TSH 0.63 06/16/2017: BUN 19; Creat 0.80; Hemoglobin 10.2; Platelets 287; Potassium 4.7; Sodium 142    Lipid Panel    Component Value Date/Time   CHOL 139 05/26/2017 1621   TRIG 125 05/26/2017 1621   HDL 58 05/26/2017 1621   CHOLHDL 2.4 05/26/2017 1621   VLDL 38 (H) 02/11/2017 1422   LDLCALC 80 02/11/2017 1422     Wt Readings from Last 3 Encounters:  06/19/17 256 lb (116.1 kg)  06/16/17 258 lb (117 kg)  05/26/17 251 lb (113.9 kg)     Other studies Reviewed: Additional studies/ records that were reviewed today include: Office notes, hospital records and testing.  ASSESSMENT AND PLAN:  1.  PAF: She is not having any palpitations today. She is not having any presyncope or syncope. She is anticoagulated. Her heart rate is stable.  2. Chronic diastolic CHF: Her weight is down 2 pounds from a few days ago. According to both she and her son she has been eating poorly and eating the wrong foods. She does not feel that her breathing is any worse, feels that she has gained some actual weight.  She is encouraged to monitor her sodium intake more carefully and focus on eating the right foods. Call us if she starts  waking with lower extremity edema or breathing gets worse.  3. End-stage COPD: I advised that I did not have any way to say how long she would live. I advised that she had to be very careful about managing her disease, exposing herself to sick contacts and being aggressive in seeking medical care if she got any kind of an upper respiratory infection. I advised that her oxygen needs are very high.  She states that she gets scared at times and will feel short of breath. I talked to her about making sure that she takes deep breaths, in through her nose and out through mouth. That she tries to slow her breathing down and focus on the breathing itself. Her son says he will help her with this.   Current medicines are reviewed at length with the patient today.  The patient does not have concerns regarding medicines.  The following changes have been made:  no change  Labs/ tests ordered today include:  No orders of the defined types were placed in this encounter.    Disposition:   FU with Dr. Gwenlyn Found  Signed, Rosaria Ferries, PA-C  06/19/2017 6:07 PM    [place] Phone: [phone]; Fax: [phone]  This note was written with the assistance of speech recognition software. Please excuse any transcriptional errors.

## 2017-06-19 NOTE — Patient Instructions (Signed)
Medication Instructions:  NO CHANGES If you need a refill on your cardiac medications before your next appointment, please call your pharmacy.  Follow-Up: Your physician wants you to follow-up in: 3 MONTHS WITH DR BERRY IF UNAVAILABLE OK TO SEE RHONDA.   Special Instructions: TAKE DAILY WEIGHTS  2,000MG  LOW SODIUM DIET   Thank you for choosing [facility]!!     Low-Sodium Eating Plan Sodium, which is an element that makes up salt, helps you maintain a healthy balance of fluids in your body. Too much sodium can increase your blood pressure and cause fluid and waste to be held in your body. Your health care provider or dietitian may recommend following this plan if you have high blood pressure (hypertension), kidney disease, liver disease, or heart failure. Eating less sodium can help lower your blood pressure, reduce swelling, and protect your heart, liver, and kidneys. What are tips for following this plan? General guidelines  Most people on this plan should limit their sodium intake to 1,500-2,000 mg (milligrams) of sodium each day. Reading food labels  The Nutrition Facts label lists the amount of sodium in one serving of the food. If you eat more than one serving, you must multiply the listed amount of sodium by the number of servings.  Choose foods with less than 140 mg of sodium per serving.  Avoid foods with 300 mg of sodium or more per serving. Shopping  Look for lower-sodium products, often labeled as "low-sodium" or "no salt added."  Always check the sodium content even if foods are labeled as "unsalted" or "no salt added".  Buy fresh foods. ? Avoid canned foods and premade or frozen meals. ? Avoid canned, cured, or processed meats  Buy breads that have less than 80 mg of sodium per slice. Cooking  Eat more home-cooked food and less restaurant, buffet, and fast food.  Avoid adding salt when cooking. Use salt-free seasonings or herbs instead of  table salt or sea salt. Check with your health care provider or pharmacist before using salt substitutes.  Cook with plant-based oils, such as canola, sunflower, or olive oil. Meal planning  When eating at a restaurant, ask that your food be prepared with less salt or no salt, if possible.  Avoid foods that contain MSG (monosodium glutamate). MSG is sometimes added to [place] food, bouillon, and some canned foods. What foods are recommended? The items listed may not be a complete list. Talk with your dietitian about what dietary choices are best for you. Grains Low-sodium cereals, including oats, puffed wheat and rice, and shredded wheat. Low-sodium crackers. Unsalted rice. Unsalted pasta. Low-sodium bread. Whole-grain breads and whole-grain pasta. Vegetables Fresh or frozen vegetables. "No salt added" canned vegetables. "No salt added" tomato sauce and paste. Low-sodium or reduced-sodium tomato and vegetable juice. Fruits Fresh, frozen, or canned fruit. Fruit juice. Meats and other protein foods Fresh or frozen (no salt added) meat, poultry, seafood, and fish. Low-sodium canned tuna and salmon. Unsalted nuts. Dried peas, beans, and lentils without added salt. Unsalted canned beans. Eggs. Unsalted nut butters. Dairy Milk. Soy milk. Cheese that is naturally low in sodium, such as ricotta cheese, fresh mozzarella, or Swiss cheese Low-sodium or reduced-sodium cheese. Cream cheese. Yogurt. Fats and oils Unsalted butter. Unsalted margarine with no trans fat. Vegetable oils such as canola or olive oils. Seasonings and other foods Fresh and dried herbs and spices. Salt-free seasonings. Low-sodium mustard and ketchup. Sodium-free salad dressing. Sodium-free light mayonnaise. Fresh or refrigerated horseradish. Lemon juice.  Vinegar. Homemade, reduced-sodium, or low-sodium soups. Unsalted popcorn and pretzels. Low-salt or salt-free chips. What foods are not recommended? The items listed may not be a  complete list. Talk with your dietitian about what dietary choices are best for you. Grains Instant hot cereals. Bread stuffing, pancake, and biscuit mixes. Croutons. Seasoned rice or pasta mixes. Noodle soup cups. Boxed or frozen macaroni and cheese. Regular salted crackers. Self-rising flour. Vegetables Sauerkraut, pickled vegetables, and relishes. Olives. [place] fries. Onion rings. Regular canned vegetables (not low-sodium or reduced-sodium). Regular canned tomato sauce and paste (not low-sodium or reduced-sodium). Regular tomato and vegetable juice (not low-sodium or reduced-sodium). Frozen vegetables in sauces. Meats and other protein foods Meat or fish that is salted, canned, smoked, spiced, or pickled. Bacon, ham, sausage, hotdogs, corned beef, chipped beef, packaged lunch meats, salt pork, jerky, pickled herring, anchovies, regular canned tuna, sardines, salted nuts. Dairy Processed cheese and cheese spreads. Cheese curds. Blue cheese. Feta cheese. String cheese. Regular cottage cheese. Buttermilk. Canned milk. Fats and oils Salted butter. Regular margarine. Ghee. Bacon fat. Seasonings and other foods Onion salt, garlic salt, seasoned salt, table salt, and sea salt. Canned and packaged gravies. Worcestershire sauce. Tartar sauce. Barbecue sauce. Teriyaki sauce. Soy sauce, including reduced-sodium. Steak sauce. Fish sauce. Oyster sauce. Cocktail sauce. Horseradish that you find on the shelf. Regular ketchup and mustard. Meat flavorings and tenderizers. Bouillon cubes. Hot sauce and Tabasco sauce. Premade or packaged marinades. Premade or packaged taco seasonings. Relishes. Regular salad dressings. Salsa. Potato and tortilla chips. Corn chips and puffs. Salted popcorn and pretzels. Canned or dried soups. Pizza. Frozen entrees and pot pies. Summary  Eating less sodium can help lower your blood pressure, reduce swelling, and protect your heart, liver, and kidneys.  Most people on this plan  should limit their sodium intake to 1,500-2,000 mg (milligrams) of sodium each day.  Canned, boxed, and frozen foods are high in sodium. Restaurant foods, fast foods, and pizza are also very high in sodium. You also get sodium by adding salt to food.  Try to cook at home, eat more fresh fruits and vegetables, and eat less fast food, canned, processed, or prepared foods. This information is not intended to replace advice given to you by your health care provider. Make sure you discuss any questions you have with your health care provider. Document Released: 02/22/2002 Document Revised: 08/26/2016 Document Reviewed: 08/26/2016 Elsevier Interactive Patient Education  2017 [place] DASH stands for "Dietary Approaches to Stop Hypertension." The DASH eating plan is a healthy eating plan that has been shown to reduce high blood pressure (hypertension). It may also reduce your risk for type 2 diabetes, heart disease, and stroke. The DASH eating plan may also help with weight loss. What are tips for following this plan? General guidelines  Avoid eating more than 2,300 mg (milligrams) of salt (sodium) a day. If you have hypertension, you may need to reduce your sodium intake to 1,500 mg a day.  Limit alcohol intake to no more than 1 drink a day for nonpregnant women and 2 drinks a day for men. One drink equals 12 oz of beer, 5 oz of wine, or 1 oz of hard liquor.  Work with your health care provider to maintain a healthy body weight or to lose weight. Ask what an ideal weight is for you.  Get at least 30 minutes of exercise that causes your heart to beat faster (aerobic exercise) most days of the week. Activities may  include walking, swimming, or biking.  Work with your health care provider or diet and nutrition specialist (dietitian) to adjust your eating plan to your individual calorie needs. Reading food labels  Check food labels for the amount of sodium per serving.  Choose foods with less than 5 percent of the Daily Value of sodium. Generally, foods with less than 300 mg of sodium per serving fit into this eating plan.  To find whole grains, look for the word "whole" as the first word in the ingredient list. Shopping  Buy products labeled as "low-sodium" or "no salt added."  Buy fresh foods. Avoid canned foods and premade or frozen meals. Cooking  Avoid adding salt when cooking. Use salt-free seasonings or herbs instead of table salt or sea salt. Check with your health care provider or pharmacist before using salt substitutes.  Do not fry foods. Cook foods using healthy methods such as baking, boiling, grilling, and broiling instead.  Cook with heart-healthy oils, such as olive, canola, soybean, or sunflower oil. Meal planning   Eat a balanced diet that includes: ? 5 or more servings of fruits and vegetables each day. At each meal, try to fill half of your plate with fruits and vegetables. ? Up to 6-8 servings of whole grains each day. ? Less than 6 oz of lean meat, poultry, or fish each day. A 3-oz serving of meat is about the same size as a deck of cards. One egg equals 1 oz. ? 2 servings of low-fat dairy each day. ? A serving of nuts, seeds, or beans 5 times each week. ? Heart-healthy fats. Healthy fats called Omega-3 fatty acids are found in foods such as flaxseeds and coldwater fish, like sardines, salmon, and mackerel.  Limit how much you eat of the following: ? Canned or prepackaged foods. ? Food that is high in trans fat, such as fried foods. ? Food that is high in saturated fat, such as fatty meat. ? Sweets, desserts, sugary drinks, and other foods with added sugar. ? Full-fat dairy products.  Do not salt foods before eating.  Try to eat at least 2 vegetarian meals each week.  Eat more home-cooked food and less restaurant, buffet, and fast food.  When eating at a restaurant, ask that your food be prepared with less salt or no  salt, if possible. What foods are recommended? The items listed may not be a complete list. Talk with your dietitian about what dietary choices are best for you. Grains Whole-grain or whole-wheat bread. Whole-grain or whole-wheat pasta. Brown rice. [place]. Bulgur. Whole-grain and low-sodium cereals. Pita bread. Low-fat, low-sodium crackers. Whole-wheat flour tortillas. Vegetables Fresh or frozen vegetables (raw, steamed, roasted, or grilled). Low-sodium or reduced-sodium tomato and vegetable juice. Low-sodium or reduced-sodium tomato sauce and tomato paste. Low-sodium or reduced-sodium canned vegetables. Fruits All fresh, dried, or frozen fruit. Canned fruit in natural juice (without added sugar). Meat and other protein foods Skinless chicken or [place]. Ground chicken or [place]. Pork with fat trimmed off. Fish and seafood. Egg whites. Dried beans, peas, or lentils. Unsalted nuts, nut butters, and seeds. Unsalted canned beans. Lean cuts of beef with fat trimmed off. Low-sodium, lean deli meat. Dairy Low-fat (1%) or fat-free (skim) milk. Fat-free, low-fat, or reduced-fat cheeses. Nonfat, low-sodium ricotta or cottage cheese. Low-fat or nonfat yogurt. Low-fat, low-sodium cheese. Fats and oils Soft margarine without trans fats. Vegetable oil. Low-fat, reduced-fat, or light mayonnaise and salad dressings (reduced-sodium). Canola, safflower, olive, soybean, and sunflower oils. Avocado. Seasoning and  other foods Herbs. Spices. Seasoning mixes without salt. Unsalted popcorn and pretzels. Fat-free sweets. What foods are not recommended? The items listed may not be a complete list. Talk with your dietitian about what dietary choices are best for you. Grains Baked goods made with fat, such as croissants, muffins, or some breads. Dry pasta or rice meal packs. Vegetables Creamed or fried vegetables. Vegetables in a cheese sauce. Regular canned vegetables (not low-sodium or reduced-sodium). Regular  canned tomato sauce and paste (not low-sodium or reduced-sodium). Regular tomato and vegetable juice (not low-sodium or reduced-sodium). Angie Fava. Olives. Fruits Canned fruit in a light or heavy syrup. Fried fruit. Fruit in cream or butter sauce. Meat and other protein foods Fatty cuts of meat. Ribs. Fried meat. [place] Salines. Sausage. Bologna and other processed lunch meats. Salami. Fatback. Hotdogs. Bratwurst. Salted nuts and seeds. Canned beans with added salt. Canned or smoked fish. Whole eggs or egg yolks. Chicken or [place] with skin. Dairy Whole or 2% milk, cream, and half-and-half. Whole or full-fat cream cheese. Whole-fat or sweetened yogurt. Full-fat cheese. Nondairy creamers. Whipped toppings. Processed cheese and cheese spreads. Fats and oils Butter. Stick margarine. Lard. Shortening. Ghee. Bacon fat. Tropical oils, such as coconut, palm kernel, or palm oil. Seasoning and other foods Salted popcorn and pretzels. Onion salt, garlic salt, seasoned salt, table salt, and sea salt. Worcestershire sauce. Tartar sauce. Barbecue sauce. Teriyaki sauce. Soy sauce, including reduced-sodium. Steak sauce. Canned and packaged gravies. Fish sauce. Oyster sauce. Cocktail sauce. Horseradish that you find on the shelf. Ketchup. Mustard. Meat flavorings and tenderizers. Bouillon cubes. Hot sauce and Tabasco sauce. Premade or packaged marinades. Premade or packaged taco seasonings. Relishes. Regular salad dressings. Where to find more information:  National Heart, Lung, and [place]: https://wilson-eaton.com/  American Heart Association: www.heart.org Summary  The DASH eating plan is a healthy eating plan that has been shown to reduce high blood pressure (hypertension). It may also reduce your risk for type 2 diabetes, heart disease, and stroke.  With the DASH eating plan, you should limit salt (sodium) intake to 2,300 mg a day. If you have hypertension, you may need to reduce your sodium intake to 1,500 mg a  day.  When on the DASH eating plan, aim to eat more fresh fruits and vegetables, whole grains, lean proteins, low-fat dairy, and heart-healthy fats.  Work with your health care provider or diet and nutrition specialist (dietitian) to adjust your eating plan to your individual calorie needs. This information is not intended to replace advice given to you by your health care provider. Make sure you discuss any questions you have with your health care provider. Document Released: 08/22/2011 Document Revised: 08/26/2016 Document Reviewed: 08/26/2016 Elsevier Interactive Patient Education  2017 [employer].

## 2017-06-21 ENCOUNTER — Other Ambulatory Visit: Payer: Self-pay | Admitting: Internal Medicine

## 2017-06-23 ENCOUNTER — Ambulatory Visit: Payer: Self-pay | Admitting: Physician Assistant

## 2017-06-25 ENCOUNTER — Other Ambulatory Visit: Payer: Self-pay | Admitting: Internal Medicine

## 2017-06-26 ENCOUNTER — Ambulatory Visit: Payer: PPO

## 2017-07-03 ENCOUNTER — Ambulatory Visit: Payer: PPO

## 2017-07-09 ENCOUNTER — Encounter: Payer: Self-pay | Admitting: Physician Assistant

## 2017-07-11 DIAGNOSIS — J449 Chronic obstructive pulmonary disease, unspecified: Secondary | ICD-10-CM | POA: Diagnosis not present

## 2017-07-11 DIAGNOSIS — J9610 Chronic respiratory failure, unspecified whether with hypoxia or hypercapnia: Secondary | ICD-10-CM | POA: Diagnosis not present

## 2017-07-14 DIAGNOSIS — J449 Chronic obstructive pulmonary disease, unspecified: Secondary | ICD-10-CM | POA: Diagnosis not present

## 2017-07-15 ENCOUNTER — Ambulatory Visit: Payer: PPO

## 2017-07-17 ENCOUNTER — Ambulatory Visit: Payer: Self-pay | Admitting: Internal Medicine

## 2017-07-19 DIAGNOSIS — J449 Chronic obstructive pulmonary disease, unspecified: Secondary | ICD-10-CM | POA: Diagnosis not present

## 2017-07-25 ENCOUNTER — Ambulatory Visit: Payer: PPO | Admitting: Pulmonary Disease

## 2017-07-29 ENCOUNTER — Ambulatory Visit: Payer: Self-pay | Admitting: Internal Medicine

## 2017-08-06 ENCOUNTER — Ambulatory Visit: Payer: PPO

## 2017-08-11 DIAGNOSIS — J9610 Chronic respiratory failure, unspecified whether with hypoxia or hypercapnia: Secondary | ICD-10-CM | POA: Diagnosis not present

## 2017-08-11 DIAGNOSIS — J449 Chronic obstructive pulmonary disease, unspecified: Secondary | ICD-10-CM | POA: Diagnosis not present

## 2017-08-12 ENCOUNTER — Other Ambulatory Visit: Payer: Self-pay | Admitting: *Deleted

## 2017-08-12 ENCOUNTER — Ambulatory Visit (INDEPENDENT_AMBULATORY_CARE_PROVIDER_SITE_OTHER): Payer: PPO | Admitting: Internal Medicine

## 2017-08-12 VITALS — BP 112/64 | HR 60 | Temp 97.9°F | Resp 20 | Ht 62.0 in | Wt 253.0 lb

## 2017-08-12 DIAGNOSIS — E1122 Type 2 diabetes mellitus with diabetic chronic kidney disease: Secondary | ICD-10-CM | POA: Diagnosis not present

## 2017-08-12 DIAGNOSIS — E559 Vitamin D deficiency, unspecified: Secondary | ICD-10-CM

## 2017-08-12 DIAGNOSIS — E782 Mixed hyperlipidemia: Secondary | ICD-10-CM | POA: Diagnosis not present

## 2017-08-12 DIAGNOSIS — I10 Essential (primary) hypertension: Secondary | ICD-10-CM

## 2017-08-12 DIAGNOSIS — Z79899 Other long term (current) drug therapy: Secondary | ICD-10-CM

## 2017-08-12 DIAGNOSIS — Z23 Encounter for immunization: Secondary | ICD-10-CM

## 2017-08-12 DIAGNOSIS — N183 Chronic kidney disease, stage 3 unspecified: Secondary | ICD-10-CM

## 2017-08-12 DIAGNOSIS — I480 Paroxysmal atrial fibrillation: Secondary | ICD-10-CM

## 2017-08-12 DIAGNOSIS — J9621 Acute and chronic respiratory failure with hypoxia: Secondary | ICD-10-CM

## 2017-08-12 DIAGNOSIS — Z7901 Long term (current) use of anticoagulants: Secondary | ICD-10-CM

## 2017-08-12 MED ORDER — FUROSEMIDE 80 MG PO TABS: 80.0000 mg | ORAL_TABLET | Freq: Every day | ORAL | 0 refills | Status: AC

## 2017-08-12 NOTE — Patient Instructions (Signed)
Bleeding Precautions When on Anticoagulant Therapy  WHAT IS ANTICOAGULANT THERAPY?  Anticoagulant therapy is taking medicine to prevent or reduce blood clots. It is also called blood thinner therapy. Blood clots that form in your blood vessels can be dangerous. They can break loose and travel to your heart, lungs, or brain. This increases your risk of a heart attack or stroke. Anticoagulant therapy causes blood to clot more slowly.  You may need anticoagulant therapy if you have:   A medical condition that increases the likelihood that blood clots will form.   A heart defect or a problem with heart rhythm.  It is also a common treatment after heart surgery, such as valve replacement.  WHAT ARE COMMON TYPES OF ANTICOAGULANT THERAPY?  Anticoagulant medicine can be injected or taken by mouth.If you need anticoagulant therapy quickly at the hospital, the medicine may be injected under your skin or given through an IV tube. Heparin is a common example of an anticoagulant that you may get at the hospital.  Most anticoagulant therapy is in the form of pills that you take at home every day. These may include:   Aspirin. This common blood thinner works by preventing blood cells (platelets) from sticking together to form a clot. Aspirin is not as strong as anticoagulants that slow down the time that it takes for your body to form a clot.   Clopidogrel. This is a newer type of drug that affects platelets. It is stronger than aspirin.   Warfarin. This is the most common anticoagulant. It changes the way your body uses vitamin K, a vitamin that helps your blood to clot. The risk of bleeding is higher with warfarin than with aspirin. You will need frequent blood tests to make sure you are taking the safest amount.   New anticoagulants. Several new drugs have been approved. They are all taken by mouth. Studies show that these drugs work as well as warfarin. They do not require blood testing. They may cause less bleeding  risk than warfarin.  WHAT DO I NEED TO REMEMBER WHEN TAKING ANTICOAGULANT THERAPY?  Anticoagulant therapy decreases your risk of forming a blood clot, but it increases your risk of bleeding. Work closely with your health care provider to make sure you are taking your medicine safely. These tips can help:   Learn ways to reduce your risk of bleeding.   If you are taking warfarin:  ? Have blood tests as ordered by your health care provider.  ? Do not make any sudden changes to your diet. Vitamin K in your diet can make warfarin less effective.  ? Do not get pregnant. This medicine may cause birth defects.   Take your medicine at the same time every day. If you forget to take your medicine, take it as soon as you remember. If you miss a whole day, do not double your dose of medicine. Take your normal dose and call your health care provider to check in.   Do not stop taking your medicine on your own.   Tell your health care provider before you start taking any new medicine, vitamin, or herbal product. Some of these could interfere with your therapy.   Tell all of your health care providers that you are on anticoagulant therapy.   Do not have surgery, medical procedures, or dental work until you tell your health care provider that you are on anticoagulant therapy.  WHAT CAN AFFECT HOW ANTICOAGULANTS WORK?  Certain foods, vitamins, medicines, supplements, and herbal   medicines change the way that anticoagulant therapy works. They may increase or decrease the effects of your anticoagulant therapy. Either result can be dangerous for you.   Many over-the-counter medicines for pain, colds, or stomach problems interfere with anticoagulant therapy. Take these only as told by your health care provider.   Do not drink alcohol. It can interfere with your medicine and increase your risk of an injury that causes bleeding.   If you are taking warfarin, do not begin eating more foods that contain vitamin K. These include  leafy green vegetables. Ask your health care provider if you should avoid any foods.  WHAT ARE SOME WAYS TO PREVENT BLEEDING?  You can prevent bleeding by taking certain precautions:   Be extra careful when you use knives, scissors, or other sharp objects.   Use an electric razor instead of a blade.   Do not use toothpicks.   Use a soft toothbrush.   Wear shoes that have nonskid soles.   Use bath mats and handrails in your bathroom.   Wear gloves while you do yard work.   Wear a helmet when you ride a bike.   Wear your seat belt.   Prevent falls by removing loose rugs and extension cords from areas where you walk.   Do not play contact sports or participate in other activities that have a high risk of injury.  WHEN SHOULD I CONTACT MY HEALTH CARE PROVIDER?  Call your health care provider if:   You miss a dose of medicine:  ? And you are not sure what to do.  ? For more than one day.   You have:  ? Menstrual bleeding that is heavier than normal.  ? Blood in your urine.  ? A bloody nose or bleeding gums.  ? Easy bruising.  ? Blood in your stool (feces) or have black and tarry stool.  ? Side effects from your medicine.   You feel weak or dizzy.   You become pregnant.  Seek immediate medical care if:   You have bleeding that will not stop.   You have sudden and severe headache or belly pain.   You vomit or you cough up bright red blood.   You have a severe blow to your head.  WHAT ARE SOME QUESTIONS TO ASK MY HEALTH CARE PROVIDER?   What is the best anticoagulant therapy for my condition?   What side effects should I watch for?   When should I take my medicine? What should I do if I forget to take it?   Will I need to have regular blood tests?   Do I need to change my diet? Are there foods or drinks that I should avoid?   What activities are safe for me?   What should I do if I want to get pregnant?  This information is not intended to replace advice given to you by your health care provider.  Make sure you discuss any questions you have with your health care provider.  Document Released: 08/14/2015 Document Reviewed: 08/14/2015  Elsevier Interactive Patient Education  2017 [employer] Inc[employer]

## 2017-08-13 ENCOUNTER — Inpatient Hospital Stay (HOSPITAL_COMMUNITY)
Admission: EM | Admit: 2017-08-13 | Discharge: 2017-08-20 | DRG: 291 | Disposition: A | Discharge status: Home / Self Care | Payer: PPO | Attending: Internal Medicine | Admitting: Internal Medicine

## 2017-08-13 ENCOUNTER — Emergency Department (HOSPITAL_COMMUNITY): Payer: PPO

## 2017-08-13 ENCOUNTER — Encounter (HOSPITAL_COMMUNITY): Payer: Self-pay

## 2017-08-13 DIAGNOSIS — R0902 Hypoxemia: Secondary | ICD-10-CM | POA: Diagnosis not present

## 2017-08-13 DIAGNOSIS — J9621 Acute and chronic respiratory failure with hypoxia: Secondary | ICD-10-CM | POA: Diagnosis present

## 2017-08-13 DIAGNOSIS — E669 Obesity, unspecified: Secondary | ICD-10-CM | POA: Diagnosis not present

## 2017-08-13 DIAGNOSIS — M1990 Unspecified osteoarthritis, unspecified site: Secondary | ICD-10-CM | POA: Diagnosis present

## 2017-08-13 DIAGNOSIS — E1122 Type 2 diabetes mellitus with diabetic chronic kidney disease: Secondary | ICD-10-CM | POA: Diagnosis not present

## 2017-08-13 DIAGNOSIS — I5033 Acute on chronic diastolic (congestive) heart failure: Secondary | ICD-10-CM

## 2017-08-13 DIAGNOSIS — Z66 Do not resuscitate: Secondary | ICD-10-CM | POA: Diagnosis present

## 2017-08-13 DIAGNOSIS — K219 Gastro-esophageal reflux disease without esophagitis: Secondary | ICD-10-CM | POA: Diagnosis present

## 2017-08-13 DIAGNOSIS — G4733 Obstructive sleep apnea (adult) (pediatric): Secondary | ICD-10-CM | POA: Diagnosis present

## 2017-08-13 DIAGNOSIS — N179 Acute kidney failure, unspecified: Secondary | ICD-10-CM | POA: Diagnosis present

## 2017-08-13 DIAGNOSIS — Z86010 Personal history of colonic polyps: Secondary | ICD-10-CM | POA: Diagnosis not present

## 2017-08-13 DIAGNOSIS — J449 Chronic obstructive pulmonary disease, unspecified: Secondary | ICD-10-CM

## 2017-08-13 DIAGNOSIS — Z7984 Long term (current) use of oral hypoglycemic drugs: Secondary | ICD-10-CM

## 2017-08-13 DIAGNOSIS — R195 Other fecal abnormalities: Secondary | ICD-10-CM | POA: Diagnosis present

## 2017-08-13 DIAGNOSIS — D649 Anemia, unspecified: Secondary | ICD-10-CM

## 2017-08-13 DIAGNOSIS — I4510 Unspecified right bundle-branch block: Secondary | ICD-10-CM | POA: Diagnosis present

## 2017-08-13 DIAGNOSIS — I10 Essential (primary) hypertension: Secondary | ICD-10-CM

## 2017-08-13 DIAGNOSIS — Z9111 Patient's noncompliance with dietary regimen: Secondary | ICD-10-CM | POA: Diagnosis not present

## 2017-08-13 DIAGNOSIS — E119 Type 2 diabetes mellitus without complications: Secondary | ICD-10-CM | POA: Diagnosis present

## 2017-08-13 DIAGNOSIS — Z7982 Long term (current) use of aspirin: Secondary | ICD-10-CM

## 2017-08-13 DIAGNOSIS — I110 Hypertensive heart disease with heart failure: Secondary | ICD-10-CM | POA: Diagnosis present

## 2017-08-13 DIAGNOSIS — Z87891 Personal history of nicotine dependence: Secondary | ICD-10-CM

## 2017-08-13 DIAGNOSIS — Z7951 Long term (current) use of inhaled steroids: Secondary | ICD-10-CM

## 2017-08-13 DIAGNOSIS — E559 Vitamin D deficiency, unspecified: Secondary | ICD-10-CM | POA: Diagnosis present

## 2017-08-13 DIAGNOSIS — K922 Gastrointestinal hemorrhage, unspecified: Secondary | ICD-10-CM

## 2017-08-13 DIAGNOSIS — E1169 Type 2 diabetes mellitus with other specified complication: Secondary | ICD-10-CM | POA: Diagnosis not present

## 2017-08-13 DIAGNOSIS — E785 Hyperlipidemia, unspecified: Secondary | ICD-10-CM | POA: Diagnosis present

## 2017-08-13 DIAGNOSIS — R791 Abnormal coagulation profile: Secondary | ICD-10-CM | POA: Diagnosis not present

## 2017-08-13 DIAGNOSIS — R0602 Shortness of breath: Secondary | ICD-10-CM

## 2017-08-13 DIAGNOSIS — E039 Hypothyroidism, unspecified: Secondary | ICD-10-CM | POA: Diagnosis present

## 2017-08-13 DIAGNOSIS — Z9981 Dependence on supplemental oxygen: Secondary | ICD-10-CM | POA: Diagnosis not present

## 2017-08-13 DIAGNOSIS — Z6841 Body Mass Index (BMI) 40.0 and over, adult: Secondary | ICD-10-CM | POA: Diagnosis not present

## 2017-08-13 DIAGNOSIS — R0609 Other forms of dyspnea: Secondary | ICD-10-CM | POA: Diagnosis not present

## 2017-08-13 DIAGNOSIS — E876 Hypokalemia: Secondary | ICD-10-CM | POA: Diagnosis not present

## 2017-08-13 DIAGNOSIS — E871 Hypo-osmolality and hyponatremia: Secondary | ICD-10-CM | POA: Diagnosis not present

## 2017-08-13 DIAGNOSIS — Z23 Encounter for immunization: Secondary | ICD-10-CM | POA: Diagnosis not present

## 2017-08-13 DIAGNOSIS — D62 Acute posthemorrhagic anemia: Secondary | ICD-10-CM | POA: Diagnosis present

## 2017-08-13 DIAGNOSIS — I480 Paroxysmal atrial fibrillation: Secondary | ICD-10-CM | POA: Diagnosis present

## 2017-08-13 DIAGNOSIS — Z7901 Long term (current) use of anticoagulants: Secondary | ICD-10-CM | POA: Diagnosis not present

## 2017-08-13 DIAGNOSIS — E782 Mixed hyperlipidemia: Secondary | ICD-10-CM | POA: Diagnosis not present

## 2017-08-13 DIAGNOSIS — J441 Chronic obstructive pulmonary disease with (acute) exacerbation: Secondary | ICD-10-CM

## 2017-08-13 DIAGNOSIS — Z79899 Other long term (current) drug therapy: Secondary | ICD-10-CM

## 2017-08-13 DIAGNOSIS — I4892 Unspecified atrial flutter: Secondary | ICD-10-CM | POA: Diagnosis present

## 2017-08-13 DIAGNOSIS — N289 Disorder of kidney and ureter, unspecified: Secondary | ICD-10-CM

## 2017-08-13 DIAGNOSIS — Z8249 Family history of ischemic heart disease and other diseases of the circulatory system: Secondary | ICD-10-CM

## 2017-08-13 DIAGNOSIS — N183 Chronic kidney disease, stage 3 (moderate): Secondary | ICD-10-CM | POA: Diagnosis not present

## 2017-08-13 DIAGNOSIS — I361 Nonrheumatic tricuspid (valve) insufficiency: Secondary | ICD-10-CM | POA: Diagnosis not present

## 2017-08-13 DIAGNOSIS — F329 Major depressive disorder, single episode, unspecified: Secondary | ICD-10-CM | POA: Diagnosis present

## 2017-08-13 DIAGNOSIS — R7309 Other abnormal glucose: Secondary | ICD-10-CM | POA: Diagnosis present

## 2017-08-13 LAB — CBC WITH DIFFERENTIAL/PLATELET
BASOS ABS: 0 10*3/uL (ref 0.0–0.1)
BASOS PCT: 0.3 %
Basophils Absolute: 20 cells/uL (ref 0–200)
Basophils Relative: 0 %
EOS ABS: 82 {cells}/uL (ref 15–500)
Eosinophils Absolute: 0 10*3/uL (ref 0.0–0.7)
Eosinophils Relative: 1.2 %
Eosinophils Relative: 0 %
HEMATOCRIT: 25.8 % — AB (ref 35.0–45.0)
HEMATOCRIT: 30.7 % — AB (ref 36.0–46.0)
HEMOGLOBIN: 7.7 g/dL — AB (ref 11.7–15.5)
Hemoglobin: 8.1 g/dL — ABNORMAL LOW (ref 12.0–15.0)
LYMPHS PCT: 6 %
Lymphs Abs: 877 cells/uL (ref 850–3900)
Lymphs Abs: 0.6 10*3/uL — ABNORMAL LOW (ref 0.7–4.0)
MCH: 23.5 pg — AB (ref 27.0–33.0)
MCH: 22.8 pg — ABNORMAL LOW (ref 26.0–34.0)
MCHC: 29.8 g/dL — ABNORMAL LOW (ref 32.0–36.0)
MCHC: 26.4 g/dL — AB (ref 30.0–36.0)
MCV: 78.9 fL — AB (ref 80.0–100.0)
MCV: 86.2 fL (ref 78.0–100.0)
MONO ABS: 0.5 10*3/uL (ref 0.1–1.0)
MONOS PCT: 5 %
MPV: 9.9 fL (ref 7.5–12.5)
Monocytes Relative: 7 %
NEUTROS ABS: 5345 {cells}/uL (ref 1500–7800)
NEUTROS ABS: 8.9 10*3/uL — AB (ref 1.7–7.7)
Neutrophils Relative %: 78.6 %
Neutrophils Relative %: 89 %
Platelets: 359 10*3/uL (ref 140–400)
Platelets: 422 10*3/uL — ABNORMAL HIGH (ref 150–400)
RBC: 3.27 10*6/uL — ABNORMAL LOW (ref 3.80–5.10)
RBC: 3.56 MIL/uL — ABNORMAL LOW (ref 3.87–5.11)
RDW: 15.5 % — ABNORMAL HIGH (ref 11.0–15.0)
RDW: 17.3 % — AB (ref 11.5–15.5)
Total Lymphocyte: 12.9 %
WBC: 6.8 10*3/uL (ref 3.8–10.8)
WBC: 10 10*3/uL (ref 4.0–10.5)
WBCMIX: 476 {cells}/uL (ref 200–950)

## 2017-08-13 LAB — COMPREHENSIVE METABOLIC PANEL
ALT: 10 U/L — ABNORMAL LOW (ref 14–54)
AST: 20 U/L (ref 15–41)
Albumin: 3.3 g/dL — ABNORMAL LOW (ref 3.5–5.0)
Alkaline Phosphatase: 95 U/L (ref 38–126)
Anion gap: 12 (ref 5–15)
BILIRUBIN TOTAL: 0.2 mg/dL — AB (ref 0.3–1.2)
BUN: 22 mg/dL — AB (ref 6–20)
CO2: 23 mmol/L (ref 22–32)
Calcium: 8.7 mg/dL — ABNORMAL LOW (ref 8.9–10.3)
Chloride: 102 mmol/L (ref 101–111)
Creatinine, Ser: 1.3 mg/dL — ABNORMAL HIGH (ref 0.44–1.00)
GFR calc Af Amer: 47 mL/min — ABNORMAL LOW (ref >60)
GFR, EST NON AFRICAN AMERICAN: 40 mL/min — AB (ref >60)
Glucose, Bld: 165 mg/dL — ABNORMAL HIGH (ref 65–99)
POTASSIUM: 4.3 mmol/L (ref 3.5–5.1)
Sodium: 137 mmol/L (ref 135–145)
TOTAL PROTEIN: 6.4 g/dL — AB (ref 6.5–8.1)

## 2017-08-13 LAB — BASIC METABOLIC PANEL WITH GFR
BUN: 17 mg/dL (ref 7–25)
CO2: 30 mmol/L (ref 20–32)
CREATININE: 0.81 mg/dL (ref 0.60–0.93)
Calcium: 9 mg/dL (ref 8.6–10.4)
Chloride: 103 mmol/L (ref 98–110)
GFR, EST AFRICAN AMERICAN: 85 mL/min/{1.73_m2} (ref >=60)
GFR, EST NON AFRICAN AMERICAN: 73 mL/min/{1.73_m2} (ref >=60)
GLUCOSE: 136 mg/dL — AB (ref 65–99)
Potassium: 4.3 mmol/L (ref 3.5–5.3)
Sodium: 141 mmol/L (ref 135–146)

## 2017-08-13 LAB — PROTIME-INR
INR: 2.3 — ABNORMAL HIGH
INR: 2.14
PROTHROMBIN TIME: 23.9 s — AB (ref 9.0–11.5)
Prothrombin Time: 23.8 seconds — ABNORMAL HIGH (ref 11.4–15.2)

## 2017-08-13 LAB — ABO/RH: ABO/RH(D): O POS

## 2017-08-13 LAB — POC OCCULT BLOOD, ED: FECAL OCCULT BLD: POSITIVE — AB

## 2017-08-13 LAB — PREPARE RBC (CROSSMATCH)

## 2017-08-13 MED ORDER — SODIUM CHLORIDE 0.9 % IV SOLN: 10.0000 mL/h | Freq: Once | INTRAVENOUS | Status: DC

## 2017-08-13 MED ORDER — ALBUTEROL SULFATE (2.5 MG/3ML) 0.083% IN NEBU: 5.0000 mg | INHALATION_SOLUTION | Freq: Once | RESPIRATORY_TRACT | Status: DC

## 2017-08-13 MED ORDER — ALBUTEROL SULFATE (2.5 MG/3ML) 0.083% IN NEBU
5.0000 mg | INHALATION_SOLUTION | Freq: Once | RESPIRATORY_TRACT | Status: AC
  Administered 2017-08-13: 5 mg via RESPIRATORY_TRACT
  Filled 2017-08-13: qty 6

## 2017-08-13 NOTE — ED Triage Notes (Signed)
Pt coming from home bib [facility]. Pt was at the doctor office yesterday and had blood work done. Pt was called about blood work pt rbc was 3.27 and Hgb is 7.7. When ems arrived pt became short of breath. Pt stats are usually in the low 90's. When ems checked them they were in the mid 80's. Pt was put on a nonreabreather and given 2.5 of albuterol. Pt states that she can breathe better. But pt is still on a nonrebreather at 7L.

## 2017-08-13 NOTE — Progress Notes (Signed)
Attempted to wean O2 by placing pt on 10L for Neb tx. Pt unable to tolerate, SpO2 dropped to around 84% and stayed. NRB placed back on pt with neb tx held under mask. Pt tolerated well. Pt states she is feeling better post neb tx. RT will continue to monitor.

## 2017-08-13 NOTE — ED Notes (Signed)
Pt reports she has not been feeling well for a week now.  States she went to her doctor's office yesterday for a routine blood work, received a phone call today stating her Hgb is 7.7.  Pt reports dizziness and lightheadedness with worsening SOB.  + COPD with home O2 at 5-6L [place].  Pt's son at bedside is her caregiver.  Pt is A&Ox 4.  Pt reports she has chronic back pain and is not able to lay down, states she has to sit up.

## 2017-08-13 NOTE — ED Provider Notes (Addendum)
[facility] EMERGENCY DEPARTMENT Provider Note   CSN: [id number] Arrival date & time: 08/13/17  2124     History   Chief Complaint Chief Complaint  Patient presents with  . Shortness of Breath  . abnormal labs    HPI Jillian Wood is a 71 y.o. female.  HPI Patient was sent in from primary care doctor for anemia.  Seen in the office yesterday for shortness of breath.  Found hemoglobin is 7.7 and is on Coumadin for her atrial flutter.  Has been feeling bad for around the back last week.  However today she became more short of breath when she was coming to the ER.  She is on home oxygen for COPD at around 5-6 L at baseline and up to 10 L with exertion.  No blood in the stool or black stool.  No history of anemia.  States she has had colonoscopies in the past but never found a bleed.  INR of 2.3 yesterday. Past Medical History:  Diagnosis Date  . Arthritis   . CHF (congestive heart failure) ([place])   . COPD (chronic obstructive pulmonary disease) ([place])   . Depression   . Diabetes mellitus type 2 in obese ([place])   . GERD (gastroesophageal reflux disease)   . Hyperlipidemia   . Hypertension   . Morbid obesity ([place])   . OAB (overactive bladder)   . PSVT (paroxysmal supraventricular tachycardia) ([place])   . Thyroid disease   . Vitamin D deficiency     Patient Active Problem List   Diagnosis Date Noted  . Abnormality of mitral valve annulus   . Aortic atherosclerosis ([place]) 12/09/2016  . Typical atrial flutter ([place])   . Dilated cardiomyopathy ([place])   . Pulmonary hypertension ([place])   . Diabetes mellitus type 2 in obese ([place])   . Acute on chronic respiratory failure with hypoxia ([place]) 07/31/2016  . COPD with acute exacerbation ([place]) 07/31/2016  . Atrial flutter with rapid ventricular response ([place]) 07/31/2016  . GERD (gastroesophageal reflux disease) 07/31/2015  . Tobacco abuse 10/18/2014  . Acute on chronic diastolic CHF (congestive heart failure) ([place])  10/18/2014  . Paroxysmal SVT (supraventricular tachycardia) ([place]) 08/04/2014  . Hypothyroidism 02/22/2014  . Medication management 11/06/2013  . Essential hypertension   . Hyperlipidemia   . Morbid obesity ([place])   . Vitamin D deficiency   . 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 Iron deficiency anemia 07/11/2010  . History of colonic polyps 07/11/2010    Past Surgical History:  Procedure Laterality Date  . CARPAL TUNNEL RELEASE     L 1992 R 1984  . EYE SURGERY Bilateral    cataract  . NM MYOVIEW LTD  01/2011   Dobutamine Myoview: Negative perfusion scan for ischemia / infarct (poor image capture); Pt developed SVT with Dobutamine that reproduced her CP as it resolved with restoration of NSR.  Marland Kitchen PUBOVAGINAL SLING    . TEE WITHOUT CARDIOVERSION N/A 12/16/2016   Procedure: TRANSESOPHAGEAL ECHOCARDIOGRAM (TEE);  Surgeon: Sanda Klein, MD;  Location: [facility] ENDOSCOPY;  Service: Cardiovascular;  Laterality: N/A;  . TONSILLECTOMY AND ADENOIDECTOMY    . TRANSTHORACIC ECHOCARDIOGRAM  01/2011   Hyperdynamic LV with EF 65-70%, Gr 1 DD, Mild Ao Stenosis - mean gradient ~19 mmHg    OB History    No data available       Home Medications    Prior to Admission medications   Medication Sig Start Date End Date Taking? Authorizing Provider  Acetaminophen (TYLENOL 8 HOUR ARTHRITIS PAIN PO) Take 1 tablet by mouth. PRN   Yes [provider]  acetaminophen-codeine (TYLENOL #3) 300-30 MG tablet Take 1 tablet by mouth every 8 (eight) hours as needed for moderate pain or severe pain. 11/19/16  Yes [name], PA-C  acetaZOLAMIDE (DIAMOX) 250 MG tablet TAKE 1 TABLET (250 MG TOTAL) BY MOUTH 3 (THREE) TIMES DAILY. 03/23/17  Yes Unk Pinto, MD  albuterol (PROVENTIL HFA;VENTOLIN HFA) 108 (90 Base) MCG/ACT inhaler Inhale 1-2 puffs into the lungs every 6 (six) hours as needed for wheezing or shortness of breath. 05/30/17  Yes [name], PA-C  aspirin 81 MG tablet Take 81 mg by mouth  daily.   Yes [provider]  bisoprolol (ZEBETA) 10 MG tablet TAKE 1 TABLET (10 MG TOTAL) BY MOUTH DAILY. 06/19/17  Yes [name], PA-C  budesonide-formoterol [facility]) 160-4.5 MCG/ACT inhaler Inhale 2 puffs into the lungs 2 (two) times daily. 01/10/17  Yes Chesley Mires, MD  Cholecalciferol (VITAMIN D PO) Take 2,000 Units by mouth daily.    Yes [provider]  diazepam (VALIUM) 5 MG tablet Take one tablet up to 2 x a daily for anxiety AS NEEDED 03/20/17  Yes [name], PA-C  diltiazem (CARDIZEM CD) 300 MG 24 hr capsule TAKE 1 CAPSULE (300 MG TOTAL) BY MOUTH DAILY. 06/21/17  Yes Unk Pinto, MD  DULoxetine (CYMBALTA) 60 MG capsule TAKE 1 CAPSULE BY MOUTH DAILY Patient taking differently: TAKE 60 mg CAPSULE BY MOUTH DAILY 02/13/17  Yes Unk Pinto, MD  famotidine (PEPCID) 20 MG tablet TAKE 1 TABLET (20 MG TOTAL) BY MOUTH 2 (TWO) TIMES DAILY. FOR ACID REFLUX 06/25/17  Yes [name], PA-C  ferrous sulfate 325 (65 FE) MG tablet Take 325 mg by mouth 2 (two) times daily with a meal.   Yes [provider]  fluticasone (FLONASE) 50 MCG/ACT nasal spray Place 2 sprays into both nostrils daily. Patient taking differently: Place 2 sprays into both nostrils daily as needed for allergies.  10/29/16  Yes Forcucci, Courtney, PA-C  furosemide (LASIX) 80 MG tablet Take 1 tablet (80 mg total) by mouth daily. 08/12/17  Yes Unk Pinto, MD  guaiFENesin (MUCINEX) 600 MG 12 hr tablet Take 600 mg by mouth 2 (two) times daily.    Yes [provider]  levothyroxine (SYNTHROID, LEVOTHROID) 175 MCG tablet TAKE 1 TABLET (175 MCG TOTAL) BY MOUTH DAILY BEFORE BREAKFAST. 06/21/17  Yes Unk Pinto, MD  loratadine-pseudoephedrine (CLARITIN-D 24-HOUR) 10-240 MG per 24 hr tablet Take 1 tablet by mouth as needed for allergies.    Yes [provider]  losartan (COZAAR) 50 MG tablet TAKE 1 TABLET BY MOUTH DAILY Patient taking differently: TAKE 50 mg TABLET BY  MOUTH DAILY 06/04/17  Yes [name], PA-C  Magnesium Chloride (MAGNESIUM DR PO) Take 1 capsule by mouth daily.    Yes [provider]  metFORMIN (GLUCOPHAGE-XR) 500 MG 24 hr tablet TAKE 1 TABLET (500 MG TOTAL) BY MOUTH 4 (FOUR) TIMES DAILY - AFTER MEALS AND AT BEDTIME. 04/29/17  Yes Unk Pinto, MD  OXYGEN-HELIUM IN Inhale 5-6 L into the lungs continuous.    Yes [provider]  pravastatin (PRAVACHOL) 40 MG tablet TAKE 1 TABLET (40 MG TOTAL) BY MOUTH DAILY. 04/08/17  Yes [name], PA-C  warfarin (COUMADIN) 5 MG tablet Take 1-1.5 tablets (5-7.5 mg total) by mouth daily at 6 PM. TAKES 7.5MG  ON TUES, THURS, SUNDAY TAKES 5MG  ALL OTHER DAYS 06/17/17  Yes Liane Comber, NP  budesonide (PULMICORT) 0.5 MG/2ML nebulizer solution Take 2 mLs (0.5 mg total) by nebulization 2 (two) times daily. Patient not taking: Reported on 08/12/2017 03/20/17   [name], PA-C  ipratropium-albuterol (DUONEB) 0.5-2.5 (3) MG/3ML SOLN Inhale 3 ml by Nebulizer 4 x / day Patient not taking: Reported on 08/12/2017 03/20/17 03/20/18  [name], PA-C    Family History Family History  Problem Relation Age of Onset  . Alcohol abuse Father   . Heart disease Father     Social History Social History   Tobacco Use  . Smoking status: Former Smoker    Packs/day: 0.50    Years: 44.00    Pack years: 22.00    Types: Cigarettes    Last attempt to quit: 07/31/2016    Years since quitting: 1.0  . Smokeless tobacco: Never Used  . Tobacco comment: smoke about 5-6 cigarette daily  Substance Use Topics  . Alcohol use: No    Alcohol/week: 0.0 oz  . Drug use: No     Allergies   Inderal [propranolol] and Ace inhibitors   Review of Systems Review of Systems  Constitutional: Positive for appetite change and fatigue.  HENT: Negative for congestion.   Respiratory: Positive for shortness of breath, wheezing and stridor.   Cardiovascular: Negative for chest pain.  Gastrointestinal:  Negative for abdominal pain.  Genitourinary: Negative for flank pain.  Musculoskeletal: Positive for back pain.  Skin: Negative for wound.  Neurological: Positive for weakness.     Physical Exam Updated Vital Signs BP (!) 109/93   Pulse (!) 52   Temp 97.7 F (36.5 C) (Oral)   Resp (!) 23   SpO2 94%   Physical Exam  Constitutional: She appears well-developed.  HENT:  Head: Atraumatic.  Eyes: Pupils are equal, round, and reactive to light.  Cardiovascular: Normal rate.  Pulmonary/Chest: She has wheezes.  Diffuse wheezes and prolonged expirations.  Abdominal: Soft. There is no tenderness.  Genitourinary: Rectal exam shows guaiac positive stool.  Genitourinary Comments: Brown stool both externally and internally.  Neurological: She is alert.  Skin: Capillary refill takes less than 2 seconds.     ED Treatments / Results  Labs (all labs ordered are listed, but only abnormal results are displayed) Labs Reviewed  CBC WITH DIFFERENTIAL/PLATELET - Abnormal; Notable for the following components:      Result Value   RBC 3.56 (*)    Hemoglobin 8.1 (*)    HCT 30.7 (*)    MCH 22.8 (*)    MCHC 26.4 (*)    RDW 17.3 (*)    Platelets 422 (*)    Neutro Abs 8.9 (*)    Lymphs Abs 0.6 (*)    All other components within normal limits  COMPREHENSIVE METABOLIC PANEL - Abnormal; Notable for the following components:   Glucose, Bld 165 (*)    BUN 22 (*)    Creatinine, Ser 1.30 (*)    Calcium 8.7 (*)    Total Protein 6.4 (*)    Albumin 3.3 (*)    ALT 10 (*)    Total Bilirubin 0.2 (*)    GFR calc non Af Amer 40 (*)    GFR calc Af Amer 47 (*)    All other components within normal limits  PROTIME-INR - Abnormal; Notable for the following components:   Prothrombin Time 23.8 (*)    All other components within normal limits  POC OCCULT BLOOD, ED - Abnormal; Notable for the following components:   Fecal Occult Bld POSITIVE (*)  All other components within normal limits  TYPE AND  SCREEN  PREPARE RBC (CROSSMATCH)  ABO/RH    EKG  EKG Interpretation  Date/Time:  Wednesday August 13 2017 22:01:15 EST Ventricular Rate:  77 PR Interval:    QRS Duration: 174 QT Interval:  440 QTC Calculation: 458 R Axis:   103 Text Interpretation:  Atrial flutter Right bundle branch block Anteroseptal infarct, age indeterminate Confirmed by Davonna Belling [phone]) on 08/13/2017 11:15:38 PM       Radiology No results found.  Procedures Procedures (including critical care time)  Medications Ordered in ED Medications  0.9 %  sodium chloride infusion (not administered)  albuterol (PROVENTIL) (2.5 MG/3ML) 0.083% nebulizer solution 5 mg (5 mg Nebulization Given 08/13/17 2233)     Initial Impression / Assessment and Plan / ED Course  I have reviewed the triage vital signs and the nursing notes.  Pertinent labs & imaging results that were available during my care of the patient were reviewed by me and considered in my medical decision making (see chart for details).     Patient with shortness of breath and anemia.  Likely has COPD exacerbation with her wheezes.  Breathing treatments given here.  However I think she likely would benefit from some blood to help improve her oxygen carrying capacity.  Will admit to hospitalist.  She is guaiac positive but did not have gross blood.  Final Clinical Impressions(s) / ED Diagnoses   Final diagnoses:  Gastrointestinal hemorrhage, unspecified gastrointestinal hemorrhage type  COPD exacerbation (HCC)  Anemia, unspecified type    ED Discharge Orders    None       Davonna Belling, MD 08/13/17 2482    Davonna Belling, MD 08/13/17 2315

## 2017-08-13 NOTE — ED Notes (Signed)
Attempted to place pt on [place] between 5-7L, pt's sats declined to 77%.  [place] EDP made aware.  Placed pt back on NRB at 15L.

## 2017-08-13 NOTE — H&P (Signed)
[facility] History and Physical  Jillian Wood LOV:[id number] DOB: 1946-07-05 DOA: 08/13/2017  Referring physician: Dr Alvino Chapel PCP: Unk Pinto, MD   Chief Complaint: SOB   HPI: Jillian Wood is a 71 y.o. female with hx of DJD, CHF, COPD, depression, DM2, HL, HTN, obesity who was at her PCP's office yest and had blood work done.  She was called today and told Hb was 7.7.  When EMS arrived pt was SOB and O2 sats were mid 80's.  She was put on nonbreather mask and given albuterol 2.5 mg.  In ED BP 116/ 64, RR 23, HR 60 atrial fib.  CXR showed bilat pulm edema.  Asked to see for admission.    Pt's son provides most of history.  She has had worsening DOE and now can't walk across the room due to SOB.  She denies any CP, prod cough, hemoptysis.  +orthopnea.  Her legs are more filled w/ fluid then usual.  She lives at home w/ her 3 sons.  Her husband died 36 mos ago.    She denies any recent abd pain, n/v/d, fevers, chills, rash.  She and her son both request no heroic measures, DNR status is requested.    Old chart >>  Nov 2017 - acute/ chron resp failure w hypoxia, multi factors including COPD/ OSA/ CHF.  Got IV abx, steroids, CPAP and O2 support. Need pulm rehab. Acute/ chron diast CHF.  New afib, severe CHADSVasc 4, on coumadin.  May 2018 - afib w RVR, acute/ chron diast CHF, a/c resp failure/ COPD on home O2. HTN/ DM. Diuresed 20 lbs, left at 247lbs.   Apr 2018 - acute/ chron resp failure, combination of COPD and diast CHF.  rx'd nebs, abx, lasix. Aflutter rx with po dilt / zebeta. Coumadin a/c.   ROS  denies CP  no joint pain   no HA  no blurry vision  no rash  no diarrhea  no nausea/ vomiting  no dysuria  no difficulty voiding  no change in urine color    Past Medical History  Past Medical History:  Diagnosis Date  . Arthritis   . CHF (congestive heart failure) ([place])   . COPD (chronic obstructive pulmonary disease) ([place])   . Depression   . Diabetes mellitus  type 2 in obese ([place])   . GERD (gastroesophageal reflux disease)   . Hyperlipidemia   . Hypertension   . Morbid obesity ([employer])   . OAB (overactive bladder)   . PSVT (paroxysmal supraventricular tachycardia) ([place])   . Thyroid disease   . Vitamin D deficiency    Past Surgical History  Past Surgical History:  Procedure Laterality Date  . CARPAL TUNNEL RELEASE     L 1992 R 1984  . EYE SURGERY Bilateral    cataract  . NM MYOVIEW LTD  01/2011   Dobutamine Myoview: Negative perfusion scan for ischemia / infarct (poor image capture); Pt developed SVT with Dobutamine that reproduced her CP as it resolved with restoration of NSR.  Marland Kitchen PUBOVAGINAL SLING    . TEE WITHOUT CARDIOVERSION N/A 12/16/2016   Procedure: TRANSESOPHAGEAL ECHOCARDIOGRAM (TEE);  Surgeon: Sanda Klein, MD;  Location: [facility] ENDOSCOPY;  Service: Cardiovascular;  Laterality: N/A;  . TONSILLECTOMY AND ADENOIDECTOMY    . TRANSTHORACIC ECHOCARDIOGRAM  01/2011   Hyperdynamic LV with EF 65-70%, Gr 1 DD, Mild Ao Stenosis - mean gradient ~19 mmHg   Family History  Family History  Problem Relation Age of Onset  . Alcohol abuse  Father   . Heart disease Father    Social History  reports that she quit smoking about a year ago. Her smoking use included cigarettes. She has a 22.00 pack-year smoking history. she has never used smokeless tobacco. She reports that she does not drink alcohol or use drugs. Allergies  Allergies  Allergen Reactions  . Inderal [Propranolol] Other (See Comments)    Hair loss  . Ace Inhibitors Cough   Home medications Prior to Admission medications   Medication Sig Start Date End Date Taking? Authorizing Provider  Acetaminophen (TYLENOL 8 HOUR ARTHRITIS PAIN PO) Take 1 tablet by mouth. PRN   Yes [provider]  acetaminophen-codeine (TYLENOL #3) 300-30 MG tablet Take 1 tablet by mouth every 8 (eight) hours as needed for moderate pain or severe pain. 11/19/16  Yes [name], PA-C  acetaZOLAMIDE  (DIAMOX) 250 MG tablet TAKE 1 TABLET (250 MG TOTAL) BY MOUTH 3 (THREE) TIMES DAILY. 03/23/17  Yes Unk Pinto, MD  albuterol (PROVENTIL HFA;VENTOLIN HFA) 108 (90 Base) MCG/ACT inhaler Inhale 1-2 puffs into the lungs every 6 (six) hours as needed for wheezing or shortness of breath. 05/30/17  Yes [name], PA-C  aspirin 81 MG tablet Take 81 mg by mouth daily.   Yes [provider]  bisoprolol (ZEBETA) 10 MG tablet TAKE 1 TABLET (10 MG TOTAL) BY MOUTH DAILY. 06/19/17  Yes [name], PA-C  budesonide-formoterol [facility]) 160-4.5 MCG/ACT inhaler Inhale 2 puffs into the lungs 2 (two) times daily. 01/10/17  Yes Chesley Mires, MD  Cholecalciferol (VITAMIN D PO) Take 2,000 Units by mouth daily.    Yes [provider]  diazepam (VALIUM) 5 MG tablet Take one tablet up to 2 x a daily for anxiety AS NEEDED 03/20/17  Yes [name], PA-C  diltiazem (CARDIZEM CD) 300 MG 24 hr capsule TAKE 1 CAPSULE (300 MG TOTAL) BY MOUTH DAILY. 06/21/17  Yes Unk Pinto, MD  DULoxetine (CYMBALTA) 60 MG capsule TAKE 1 CAPSULE BY MOUTH DAILY Patient taking differently: TAKE 60 mg CAPSULE BY MOUTH DAILY 02/13/17  Yes Unk Pinto, MD  famotidine (PEPCID) 20 MG tablet TAKE 1 TABLET (20 MG TOTAL) BY MOUTH 2 (TWO) TIMES DAILY. FOR ACID REFLUX 06/25/17  Yes [name], PA-C  ferrous sulfate 325 (65 FE) MG tablet Take 325 mg by mouth 2 (two) times daily with a meal.   Yes [provider]  fluticasone (FLONASE) 50 MCG/ACT nasal spray Place 2 sprays into both nostrils daily. Patient taking differently: Place 2 sprays into both nostrils daily as needed for allergies.  10/29/16  Yes Forcucci, Courtney, PA-C  furosemide (LASIX) 80 MG tablet Take 1 tablet (80 mg total) by mouth daily. 08/12/17  Yes Unk Pinto, MD  guaiFENesin (MUCINEX) 600 MG 12 hr tablet Take 600 mg by mouth 2 (two) times daily.    Yes [provider]  levothyroxine (SYNTHROID, LEVOTHROID) 175 MCG tablet  TAKE 1 TABLET (175 MCG TOTAL) BY MOUTH DAILY BEFORE BREAKFAST. 06/21/17  Yes Unk Pinto, MD  loratadine-pseudoephedrine (CLARITIN-D 24-HOUR) 10-240 MG per 24 hr tablet Take 1 tablet by mouth as needed for allergies.    Yes [provider]  losartan (COZAAR) 50 MG tablet TAKE 1 TABLET BY MOUTH DAILY Patient taking differently: TAKE 50 mg TABLET BY MOUTH DAILY 06/04/17  Yes [name], PA-C  Magnesium Chloride (MAGNESIUM DR PO) Take 1 capsule by mouth daily.    Yes [provider]  metFORMIN (GLUCOPHAGE-XR) 500 MG 24 hr tablet TAKE 1 TABLET (500  MG TOTAL) BY MOUTH 4 (FOUR) TIMES DAILY - AFTER MEALS AND AT BEDTIME. 04/29/17  Yes Unk Pinto, MD  OXYGEN-HELIUM IN Inhale 5-6 L into the lungs continuous.    Yes [provider]  pravastatin (PRAVACHOL) 40 MG tablet TAKE 1 TABLET (40 MG TOTAL) BY MOUTH DAILY. 04/08/17  Yes [name], PA-C  warfarin (COUMADIN) 5 MG tablet Take 1-1.5 tablets (5-7.5 mg total) by mouth daily at 6 PM. TAKES 7.5MG ON TUES, THURS, SUNDAY TAKES 5MG ALL OTHER DAYS 06/17/17  Yes Liane Comber, NP  budesonide (PULMICORT) 0.5 MG/2ML nebulizer solution Take 2 mLs (0.5 mg total) by nebulization 2 (two) times daily. Patient not taking: Reported on 08/12/2017 03/20/17   [name], PA-C  ipratropium-albuterol (DUONEB) 0.5-2.5 (3) MG/3ML SOLN Inhale 3 ml by Nebulizer 4 x / day Patient not taking: Reported on 08/12/2017 03/20/17 03/20/18  [name], PA-C   Liver Function Tests Recent Labs  Lab 08/13/17 2206  AST 20  ALT 10*  ALKPHOS 95  BILITOT 0.2*  PROT 6.4*  ALBUMIN 3.3*   No results for input(s): LIPASE, AMYLASE in the last 168 hours. CBC Recent Labs  Lab 08/12/17 1729 08/13/17 2206  WBC 6.8 10.0  NEUTROABS 5,345 8.9*  HGB 7.7* 8.1*  HCT 25.8* 30.7*  MCV 78.9* 86.2  PLT 359 161*   Basic Metabolic Panel Recent Labs  Lab 08/12/17 1729 08/13/17 2206  NA 141 137  K 4.3 4.3  CL 103 102  CO2 30 23  GLUCOSE 136*  165*  BUN 17 22*  CREATININE 0.81 1.30*  CALCIUM 9.0 8.7*     Vitals:   08/13/17 2127 08/13/17 2154 08/13/17 2215 08/13/17 2242  BP:  116/64 (!) 109/93   Pulse:  62 (!) 52   Resp:  (!) 23 (!) 23   Temp:  97.7 F (36.5 C)    TempSrc:  Oral    SpO2: 92% 97% 98% 94%   Exam: Gen pale, morbidly obese WF, looks tired , FM O2, not in distress No rash, cyanosis or gangrene Sclera anicteric, throat clear  +JVD to angle of jaw Chest bilat rales 1/3 up RRR no RG Abd soft ntnd no mass or ascites +bs obese GU defer MS no joint effusions or deformity Ext 2+ tense pitting LE edema / no wounds or ulcers Neuro is alert, Ox 3 , nf    Home meds: -diamox 250 tid/ zebeta 10 qd/ cardizem CD 300 qd/ lasix 80 qd/ losartan 50 qd/  -albuterol hfa/ symbicort bid/ mucinex/ claritin/ 5-6L home O2/ duoneb/ pulmicort -tylenol #3 prn/ valium 38m prn/ cymbalta 60 qd -coumadin/ pravastatin/ asa/ synthroid/ pepcid/ Mg -metformin xr 500 qid    Na 137  K 4.3  BUN 22  Cr 1.30   CO2 23  Alb 3.3  LFT's ok  eGFR 45   WBC 10k  Hb 8.1  plt 422    Assessment: 1. Dyspnea/ SOB - decompensated diast CHF, pulm edema on CXR and exam, etc.  Needs diuresis.  Recurrent problem, 3rd admit this year for similar issues.  2. COPD - not wheezing tonight. On home O2 3. Morbid obesity 4. OSA - uses bipap overnight at home 5. DM2 - on po only 6. Afib/ flutter - rate controlled, cont zebeta and dilt. Hold coumadin w/ #9 7. HTN - cont zebeta and dilt, hold losartan w/ ^[place]8. Acute renal insuff - hold ARB, prob d/t decomp CHF 9. Anemia/ guiac + stool - hold transfusion due to #1, Hb  may come up w/ diuresis. Stool guiac is +. Hold coumadin for now.  10. DNR - pt / family request     Plan - as above       [place] [facility] Pager [phone]   If 7PM-7AM, please contact night-coverage [email or web address] Password [email or web address] 08/13/2017, 11:14 PM

## 2017-08-13 NOTE — Care Management (Signed)
ED CM was consulted by [facility] personnel who stated patient is living alone at home in unsafe conditions. Patient presents tonight after receiving a call from PCP office  with Hgb 7.7 and SOB.  THN, CM and SW consult has been placed. Patient ED evaluation in progress.

## 2017-08-14 ENCOUNTER — Other Ambulatory Visit: Payer: Self-pay

## 2017-08-14 ENCOUNTER — Inpatient Hospital Stay (HOSPITAL_COMMUNITY): Payer: PPO

## 2017-08-14 DIAGNOSIS — E785 Hyperlipidemia, unspecified: Secondary | ICD-10-CM | POA: Diagnosis present

## 2017-08-14 DIAGNOSIS — I361 Nonrheumatic tricuspid (valve) insufficiency: Secondary | ICD-10-CM | POA: Diagnosis not present

## 2017-08-14 DIAGNOSIS — R0609 Other forms of dyspnea: Secondary | ICD-10-CM | POA: Diagnosis not present

## 2017-08-14 DIAGNOSIS — G4733 Obstructive sleep apnea (adult) (pediatric): Secondary | ICD-10-CM | POA: Diagnosis present

## 2017-08-14 DIAGNOSIS — E559 Vitamin D deficiency, unspecified: Secondary | ICD-10-CM | POA: Diagnosis present

## 2017-08-14 DIAGNOSIS — E669 Obesity, unspecified: Secondary | ICD-10-CM | POA: Diagnosis not present

## 2017-08-14 DIAGNOSIS — E876 Hypokalemia: Secondary | ICD-10-CM | POA: Diagnosis not present

## 2017-08-14 DIAGNOSIS — E039 Hypothyroidism, unspecified: Secondary | ICD-10-CM | POA: Diagnosis present

## 2017-08-14 DIAGNOSIS — Z86010 Personal history of colonic polyps: Secondary | ICD-10-CM | POA: Diagnosis not present

## 2017-08-14 DIAGNOSIS — F329 Major depressive disorder, single episode, unspecified: Secondary | ICD-10-CM | POA: Diagnosis present

## 2017-08-14 DIAGNOSIS — E119 Type 2 diabetes mellitus without complications: Secondary | ICD-10-CM | POA: Diagnosis present

## 2017-08-14 DIAGNOSIS — J441 Chronic obstructive pulmonary disease with (acute) exacerbation: Secondary | ICD-10-CM

## 2017-08-14 DIAGNOSIS — K219 Gastro-esophageal reflux disease without esophagitis: Secondary | ICD-10-CM | POA: Diagnosis present

## 2017-08-14 DIAGNOSIS — I4510 Unspecified right bundle-branch block: Secondary | ICD-10-CM | POA: Diagnosis present

## 2017-08-14 DIAGNOSIS — R195 Other fecal abnormalities: Secondary | ICD-10-CM | POA: Diagnosis not present

## 2017-08-14 DIAGNOSIS — K922 Gastrointestinal hemorrhage, unspecified: Secondary | ICD-10-CM

## 2017-08-14 DIAGNOSIS — I5033 Acute on chronic diastolic (congestive) heart failure: Secondary | ICD-10-CM | POA: Diagnosis present

## 2017-08-14 DIAGNOSIS — I4892 Unspecified atrial flutter: Secondary | ICD-10-CM | POA: Diagnosis present

## 2017-08-14 DIAGNOSIS — D649 Anemia, unspecified: Secondary | ICD-10-CM | POA: Diagnosis not present

## 2017-08-14 DIAGNOSIS — E871 Hypo-osmolality and hyponatremia: Secondary | ICD-10-CM | POA: Diagnosis not present

## 2017-08-14 DIAGNOSIS — R791 Abnormal coagulation profile: Secondary | ICD-10-CM | POA: Diagnosis not present

## 2017-08-14 DIAGNOSIS — J449 Chronic obstructive pulmonary disease, unspecified: Secondary | ICD-10-CM

## 2017-08-14 DIAGNOSIS — Z66 Do not resuscitate: Secondary | ICD-10-CM | POA: Diagnosis present

## 2017-08-14 DIAGNOSIS — J9621 Acute and chronic respiratory failure with hypoxia: Secondary | ICD-10-CM | POA: Diagnosis present

## 2017-08-14 DIAGNOSIS — Z9981 Dependence on supplemental oxygen: Secondary | ICD-10-CM | POA: Diagnosis not present

## 2017-08-14 DIAGNOSIS — R0602 Shortness of breath: Secondary | ICD-10-CM | POA: Diagnosis not present

## 2017-08-14 DIAGNOSIS — I480 Paroxysmal atrial fibrillation: Secondary | ICD-10-CM | POA: Diagnosis present

## 2017-08-14 DIAGNOSIS — D62 Acute posthemorrhagic anemia: Secondary | ICD-10-CM | POA: Diagnosis present

## 2017-08-14 DIAGNOSIS — Z6841 Body Mass Index (BMI) 40.0 and over, adult: Secondary | ICD-10-CM | POA: Diagnosis not present

## 2017-08-14 DIAGNOSIS — N179 Acute kidney failure, unspecified: Secondary | ICD-10-CM | POA: Diagnosis present

## 2017-08-14 DIAGNOSIS — Z7901 Long term (current) use of anticoagulants: Secondary | ICD-10-CM | POA: Diagnosis not present

## 2017-08-14 DIAGNOSIS — Z9111 Patient's noncompliance with dietary regimen: Secondary | ICD-10-CM | POA: Diagnosis not present

## 2017-08-14 DIAGNOSIS — E1169 Type 2 diabetes mellitus with other specified complication: Secondary | ICD-10-CM | POA: Diagnosis not present

## 2017-08-14 DIAGNOSIS — I110 Hypertensive heart disease with heart failure: Secondary | ICD-10-CM | POA: Diagnosis present

## 2017-08-14 DIAGNOSIS — N289 Disorder of kidney and ureter, unspecified: Secondary | ICD-10-CM | POA: Diagnosis not present

## 2017-08-14 DIAGNOSIS — I10 Essential (primary) hypertension: Secondary | ICD-10-CM | POA: Diagnosis not present

## 2017-08-14 LAB — GLUCOSE, CAPILLARY
GLUCOSE-CAPILLARY: 120 mg/dL — AB (ref 65–99)
Glucose-Capillary: 98 mg/dL (ref 65–99)

## 2017-08-14 LAB — CBC
HCT: 28 % — ABNORMAL LOW (ref 36.0–46.0)
HEMOGLOBIN: 7.8 g/dL — AB (ref 12.0–15.0)
MCH: 22.9 pg — AB (ref 26.0–34.0)
MCHC: 27.9 g/dL — AB (ref 30.0–36.0)
MCV: 82.4 fL (ref 78.0–100.0)
Platelets: 307 10*3/uL (ref 150–400)
RBC: 3.4 MIL/uL — ABNORMAL LOW (ref 3.87–5.11)
RDW: 16.9 % — ABNORMAL HIGH (ref 11.5–15.5)
WBC: 7.6 10*3/uL (ref 4.0–10.5)

## 2017-08-14 LAB — COMPREHENSIVE METABOLIC PANEL
ALT: 19 U/L (ref 14–54)
AST: 31 U/L (ref 15–41)
Albumin: 3.3 g/dL — ABNORMAL LOW (ref 3.5–5.0)
Alkaline Phosphatase: 98 U/L (ref 38–126)
Anion gap: 9 (ref 5–15)
BUN: 28 mg/dL — ABNORMAL HIGH (ref 6–20)
CHLORIDE: 98 mmol/L — AB (ref 101–111)
CO2: 30 mmol/L (ref 22–32)
CREATININE: 1.56 mg/dL — AB (ref 0.44–1.00)
Calcium: 8.8 mg/dL — ABNORMAL LOW (ref 8.9–10.3)
GFR calc non Af Amer: 32 mL/min — ABNORMAL LOW (ref >60)
GFR, EST AFRICAN AMERICAN: 37 mL/min — AB (ref >60)
Glucose, Bld: 113 mg/dL — ABNORMAL HIGH (ref 65–99)
Potassium: 4.1 mmol/L (ref 3.5–5.1)
SODIUM: 137 mmol/L (ref 135–145)
Total Bilirubin: 0.5 mg/dL (ref 0.3–1.2)
Total Protein: 6.3 g/dL — ABNORMAL LOW (ref 6.5–8.1)

## 2017-08-14 LAB — ECHOCARDIOGRAM COMPLETE
AOASC: 37 cm
CHL CUP MV DEC (S): 194
CHL CUP PV REG GRAD DIAS: 18 mmHg
E decel time: 194 msec
FS: 29 % (ref 28–44)
HEIGHTINCHES: 62 in
IVS/LV PW RATIO, ED: 1.03
LA diam end sys: 55 mm
LA vol A4C: 114 ml
LADIAMINDEX: 2.37 cm/m2
LASIZE: 55 mm
LAVOL: 127 mL
LAVOLIN: 54.7 mL/m2
LVOT area: 2.84 cm2
LVOT diameter: 19 mm
MV Peak grad: 5 mmHg
MV pk A vel: 60.6 m/s
MVPKEVEL: 115 m/s
PV Reg vel dias: 211 cm/s
PW: 11.5 mm — AB (ref 0.6–1.1)
Reg peak vel: 356 cm/s
TAPSE: 13.6 mm
TRMAXVEL: 356 cm/s
Weight: 4088.21 oz

## 2017-08-14 LAB — MRSA PCR SCREENING: MRSA by PCR: NEGATIVE

## 2017-08-14 LAB — BRAIN NATRIURETIC PEPTIDE: B NATRIURETIC PEPTIDE 5: 554.2 pg/mL — AB (ref 0.0–100.0)

## 2017-08-14 MED ORDER — DULOXETINE HCL 60 MG PO CPEP
60.0000 mg | ORAL_CAPSULE | Freq: Every day | ORAL | Status: DC
  Administered 2017-08-14 – 2017-08-20 (×7): 60 mg via ORAL
  Filled 2017-08-14 (×7): qty 1

## 2017-08-14 MED ORDER — SODIUM CHLORIDE 0.9 % IV SOLN: 250.0000 mL | INTRAVENOUS | Status: DC | PRN

## 2017-08-14 MED ORDER — DILTIAZEM HCL ER COATED BEADS 120 MG PO CP24
300.0000 mg | ORAL_CAPSULE | Freq: Every day | ORAL | Status: DC
  Administered 2017-08-14 – 2017-08-20 (×7): 300 mg via ORAL
  Filled 2017-08-14 (×7): qty 1

## 2017-08-14 MED ORDER — ACETAMINOPHEN-CODEINE #3 300-30 MG PO TABS
1.0000 | ORAL_TABLET | Freq: Three times a day (TID) | ORAL | Status: DC | PRN
  Administered 2017-08-15 – 2017-08-18 (×4): 1 via ORAL
  Filled 2017-08-14 (×4): qty 1

## 2017-08-14 MED ORDER — LEVALBUTEROL HCL 0.63 MG/3ML IN NEBU
0.6300 mg | INHALATION_SOLUTION | Freq: Four times a day (QID) | RESPIRATORY_TRACT | Status: DC
  Administered 2017-08-14 – 2017-08-20 (×23): 0.63 mg via RESPIRATORY_TRACT
  Filled 2017-08-14 (×26): qty 3

## 2017-08-14 MED ORDER — LEVOTHYROXINE SODIUM 100 MCG PO TABS
175.0000 ug | ORAL_TABLET | Freq: Every day | ORAL | Status: DC
  Administered 2017-08-14 – 2017-08-15 (×2): 175 ug via ORAL
  Administered 2017-08-16: 100 ug via ORAL
  Administered 2017-08-17 – 2017-08-20 (×4): 175 ug via ORAL
  Filled 2017-08-14 (×2): qty 1
  Filled 2017-08-14: qty 3
  Filled 2017-08-14 (×5): qty 1

## 2017-08-14 MED ORDER — BISOPROLOL FUMARATE 5 MG PO TABS
10.0000 mg | ORAL_TABLET | Freq: Every day | ORAL | Status: DC
  Administered 2017-08-14 – 2017-08-20 (×7): 10 mg via ORAL
  Filled 2017-08-14 (×7): qty 2

## 2017-08-14 MED ORDER — ACETAMINOPHEN 325 MG PO TABS
650.0000 mg | ORAL_TABLET | ORAL | Status: DC | PRN
Start: 2017-08-14 — End: 2017-08-20
  Administered 2017-08-14: 650 mg via ORAL
  Filled 2017-08-14: qty 2

## 2017-08-14 MED ORDER — GUAIFENESIN ER 600 MG PO TB12
600.0000 mg | ORAL_TABLET | Freq: Two times a day (BID) | ORAL | Status: DC
  Administered 2017-08-14: 600 mg via ORAL
  Filled 2017-08-14: qty 1

## 2017-08-14 MED ORDER — MOMETASONE FURO-FORMOTEROL FUM 200-5 MCG/ACT IN AERO
2.0000 | INHALATION_SPRAY | Freq: Two times a day (BID) | RESPIRATORY_TRACT | Status: DC
  Administered 2017-08-14 – 2017-08-20 (×8): 2 via RESPIRATORY_TRACT
  Filled 2017-08-14 (×2): qty 8.8

## 2017-08-14 MED ORDER — FERROUS SULFATE 325 (65 FE) MG PO TABS
325.0000 mg | ORAL_TABLET | Freq: Two times a day (BID) | ORAL | Status: DC
  Administered 2017-08-14 – 2017-08-18 (×8): 325 mg via ORAL
  Filled 2017-08-14 (×9): qty 1

## 2017-08-14 MED ORDER — METFORMIN HCL ER 500 MG PO TB24
500.0000 mg | ORAL_TABLET | Freq: Three times a day (TID) | ORAL | Status: DC
  Administered 2017-08-14 (×3): 500 mg via ORAL
  Filled 2017-08-14 (×5): qty 1

## 2017-08-14 MED ORDER — PRAVASTATIN SODIUM 40 MG PO TABS
40.0000 mg | ORAL_TABLET | Freq: Every day | ORAL | Status: DC
  Administered 2017-08-14 – 2017-08-19 (×6): 40 mg via ORAL
  Filled 2017-08-14 (×6): qty 1

## 2017-08-14 MED ORDER — GUAIFENESIN ER 600 MG PO TB12
1200.0000 mg | ORAL_TABLET | Freq: Two times a day (BID) | ORAL | Status: DC
  Administered 2017-08-14 – 2017-08-20 (×11): 1200 mg via ORAL
  Filled 2017-08-14 (×12): qty 2

## 2017-08-14 MED ORDER — FUROSEMIDE 10 MG/ML IJ SOLN
60.0000 mg | Freq: Once | INTRAMUSCULAR | Status: AC
  Administered 2017-08-14: 60 mg via INTRAVENOUS

## 2017-08-14 MED ORDER — DIAZEPAM 5 MG PO TABS
5.0000 mg | ORAL_TABLET | Freq: Two times a day (BID) | ORAL | Status: DC | PRN
  Administered 2017-08-14 – 2017-08-20 (×5): 5 mg via ORAL
  Filled 2017-08-14 (×5): qty 1

## 2017-08-14 MED ORDER — ONDANSETRON HCL 4 MG/2ML IJ SOLN: 4.0000 mg | Freq: Four times a day (QID) | INTRAMUSCULAR | Status: DC | PRN

## 2017-08-14 MED ORDER — WARFARIN SODIUM 7.5 MG PO TABS: 7.5000 mg | ORAL_TABLET | ORAL | Status: DC

## 2017-08-14 MED ORDER — IPRATROPIUM BROMIDE 0.02 % IN SOLN
0.5000 mg | Freq: Four times a day (QID) | RESPIRATORY_TRACT | Status: DC
  Administered 2017-08-14 – 2017-08-20 (×23): 0.5 mg via RESPIRATORY_TRACT
  Filled 2017-08-14 (×25): qty 2.5

## 2017-08-14 MED ORDER — ORAL CARE MOUTH RINSE
15.0000 mL | Freq: Two times a day (BID) | OROMUCOSAL | Status: DC
  Administered 2017-08-14 – 2017-08-19 (×5): 15 mL via OROMUCOSAL

## 2017-08-14 MED ORDER — FUROSEMIDE 10 MG/ML IJ SOLN
60.0000 mg | Freq: Three times a day (TID) | INTRAMUSCULAR | Status: DC
  Administered 2017-08-14 – 2017-08-18 (×12): 60 mg via INTRAVENOUS
  Filled 2017-08-14 (×15): qty 6

## 2017-08-14 MED ORDER — WARFARIN SODIUM 5 MG PO TABS: 5.0000 mg | ORAL_TABLET | ORAL | Status: DC

## 2017-08-14 MED ORDER — CHLORHEXIDINE GLUCONATE 0.12 % MT SOLN
15.0000 mL | Freq: Two times a day (BID) | OROMUCOSAL | Status: DC
  Administered 2017-08-14 – 2017-08-20 (×10): 15 mL via OROMUCOSAL
  Filled 2017-08-14 (×11): qty 15

## 2017-08-14 MED ORDER — SODIUM CHLORIDE 0.9% FLUSH
3.0000 mL | Freq: Two times a day (BID) | INTRAVENOUS | Status: DC
  Administered 2017-08-14 – 2017-08-20 (×12): 3 mL via INTRAVENOUS

## 2017-08-14 MED ORDER — FUROSEMIDE 10 MG/ML IJ SOLN
INTRAMUSCULAR | Status: AC
  Filled 2017-08-14: qty 8

## 2017-08-14 MED ORDER — SODIUM CHLORIDE 0.9% FLUSH: 3.0000 mL | INTRAVENOUS | Status: DC | PRN

## 2017-08-14 MED ORDER — FAMOTIDINE 20 MG PO TABS
20.0000 mg | ORAL_TABLET | Freq: Two times a day (BID) | ORAL | Status: DC
  Administered 2017-08-14 – 2017-08-20 (×12): 20 mg via ORAL
  Filled 2017-08-14 (×12): qty 1

## 2017-08-14 NOTE — Progress Notes (Signed)
Pt is on BIPAP at this time with increase O2 support. O2 therapy will be titrated as tolerated. Pt is stable at this time.

## 2017-08-14 NOTE — Progress Notes (Signed)
Pt transported from ED to [place] on BIPAP without incidence.

## 2017-08-14 NOTE — Consult Note (Signed)
   [facility] CM Inpatient Consult   08/14/2017  Jillian Wood 16-Aug-1946 [id number]   Referral received from ED [facility] for post hospital follow up with patient with COPD, issues with hoarding noted in referral for community follow up.  Patient is currently at [facility] level of care.  Will follow for progress and disposition needs.    Natividad Brood, RN BSN [facility] Liaison  [phone] business mobile phone Toll free office [phone]

## 2017-08-14 NOTE — ED Notes (Signed)
Resp called for bi-pap. Charge aware of pt status.

## 2017-08-14 NOTE — Progress Notes (Addendum)
Paged MD on call, [place], regarding pts bp 87/50 MAP (60). Holding lasix 60 mg per MD order. Verbal order to give if SBP > 100. Pt stated her normal BP is around what it is now, relayed message to MD, no new orders at this time. Pt alert and oriented. Will continue to monitor.

## 2017-08-14 NOTE — Progress Notes (Signed)
PROGRESS NOTE    Jillian Wood  UKG:[id number] DOB: 18-Apr-1946 DOA: 08/13/2017 PCP: Unk Pinto, MD  Brief Narrative:  Jillian Wood is a 71 y.o. female with hx of DJD, CHF, COPD, depression, DM2, HL, HTN, obesity who was at her PCP's office yest and had blood work done.  She was called today and told Hb was 7.7. When EMS arrived pt was SOB and O2 sats were mid 80's.  She was put on nonbreather mask and given albuterol 2.5 mg.  In ED BP 116/ 64, RR 23, HR 60 atrial fib.  CXR showed bilat pulm edema.  TRH to see for admission.   She has had worsening DOE and now can't walk across the room due to SOB.  She denies any CP, prod cough, hemoptysis.  +orthopnea.  Her legs are more filled w/ fluid then usual.  She lives at home w/ her 3 sons. Her husband died 47 mos ago. Admitted for Decompensated CHF and Acute on Chronic Respiratory Failure with Hypoxia. Found to be Anemic and FOBT +.  Assessment & Plan:   Principal Problem:   Acute on chronic diastolic CHF (congestive heart failure) (HCC) Active Problems:   Essential hypertension   Morbid obesity (HCC)   Acute on chronic respiratory failure with hypoxia (HCC)   Diabetes mellitus type 2 in obese (HCC)   OSA (obstructive sleep apnea)   Anemia   Acute renal insufficiency   DNR (do not resuscitate)   Dyspnea   Acute on chronic diastolic (congestive) heart failure (HCC)  Acute on Chronic Respiratory Failure with Hypoxia 2/2 to Pulmonary Edema and Decompensated Diastolic CHF -Wears Supplemental O2 at Home -Recurrent Problem and 3rd Admit this year -C/w BiPAP Support and Wean as Tolerated -C/w Diuresis with IV 60 mg q8h -Repeat CXR in AM   Acute on Chronic Diastolic CHF -BNP is 762.8 -C/w IV Diuresis with 60 mg q8h -Strict I's/O's, Daily Weights -Repeat ECHO -Patient is -1.045 -C/w BiPAP support -Cardiology Consulted for further evaluation and Recommendations  COPD  -C/w Supplemental O2 -C/w Xopenex/Atrovent q6h -Repeat CXR  in AM   OSA  -Uses bipap overnight at home  DM2  -On po Medications with Metformin 500 mg TIDwm and Bedtime at Home -Will Hold po Home Meds -Start Sensitive Novolog [facility]  ParoxysmalAtrial Fib/ Atrial Flutter  -Rate controlled,  -Cont zebeta and dilt.  -Hold coumadin w/ Positive Hemoccult and Anemia  HTN  -Cont Zebeta and Diltiazem,  -Hold Losartan w/ [place]  Acute Renal Insufficiency   -BUN/Cr went from 17/0.81 -> 22/1.30 -Hold ARB, prob d/t decomp CHF -C/w Diuresis and expect to improve -Repeat CMP in AM  Anemia / Guiac + stool  -Hold transfusion due to Acute on Chronic CHF, Hb may come up w/ diuresis. Stool guiac is +.  -Hold coumadin for now.  -Repeat Hb/Hct was 7.8/28.0 -C/w Ferrous Sulfate 325 mg po BID  Hypothyroidism -Check TSH and Free T4 -C/w Levothyroxine 175 mcg po Daily   HLD -C/w Pravastatin 40 mg po Daily  Anxiety/Depression -C/w Duloxetine 60 mg po Daily and Diazepam 5 mg q12hprn   DVT prophylaxis: SCDs; Coumadin held Code Status: DO NOT RESUSCITATE Family Communication: No family at bedside Disposition Plan: Remain in SDU  Consultants:   Cardiology   Procedures:  ECHOCARDIOGRAM   Antimicrobials:  Anti-infectives (From admission, onward)   None     Subjective: Seen and examined at bedside and nursing stated would not keep BiPAP on. States she would try with Benzo.  No CP but still very SOB. No lightheadedness or dizziness.   Objective: Vitals:   08/14/17 0600 08/14/17 0630 08/14/17 0645 08/14/17 0700  BP: (!) 90/55 (!) 86/69 (!) 84/51 93/75  Pulse: 84 (!) 53 (!) 27 69  Resp: 19 17 (!) 22 (!) 24  Temp:      TempSrc:      SpO2: 92% 90% (!) 89% 97%  Weight:      Height:       No intake or output data in the 24 hours ending 08/14/17 0734 Filed Weights   08/14/17 0416  Weight: 115.9 kg (255 lb 8.2 oz)   Examination: Physical Exam:  Constitutional: WN/WD obese Caucasian female in moderate respiratory distress Eyes: Lids and  conjunctivae normal, sclerae anicteric  ENMT: External Ears, Nose appear normal. Grossly normal hearing. Mucous membranes are moist. Neck: Appears normal, supple, no cervical masses, normal ROM, no appreciable thyromegaly; Has JVD Respiratory: Diminished to auscultation bilaterally with some crackles and rhonchi. Mild wheezing. Increased respiratory effort but no accessory muscles.  Cardiovascular: Irregular but rate controlled, 2/6 Systolic Murmur but no gallops. S1 and S2 auscultated. Some Lower extremity edema.  Abdomen: Soft, non-tender, non-distended. No masses palpated. No appreciable hepatosplenomegaly. Bowel sounds positive x4.  GU: Deferred. Musculoskeletal: No clubbing / cyanosis of digits/nails. No joint deformity upper and lower extremities. Skin: No rashes, lesions, ulcers on a limited skin evaluation. No induration; Warm and dry.  Neurologic: CN 2-12 grossly intact with no focal deficits. Romberg sign and cerebellar reflexes not assessed.  Psychiatric: Normal judgment and insight. Alert and oriented x 3. Anxious mood and appropriate affect.   Data Reviewed: I have personally reviewed following labs and imaging studies  CBC: Recent Labs  Lab 08/12/17 1729 08/13/17 2206  WBC 6.8 10.0  NEUTROABS 5,345 8.9*  HGB 7.7* 8.1*  HCT 25.8* 30.7*  MCV 78.9* 86.2  PLT 359 361*   Basic Metabolic Panel: Recent Labs  Lab 08/12/17 1729 08/13/17 2206  NA 141 137  K 4.3 4.3  CL 103 102  CO2 30 23  GLUCOSE 136* 165*  BUN 17 22*  CREATININE 0.81 1.30*  CALCIUM 9.0 8.7*   GFR: Estimated Creatinine Clearance: 47.9 mL/min (A) (by C-G formula based on SCr of 1.3 mg/dL (H)). Liver Function Tests: Recent Labs  Lab 08/13/17 2206  AST 20  ALT 10*  ALKPHOS 95  BILITOT 0.2*  PROT 6.4*  ALBUMIN 3.3*   No results for input(s): LIPASE, AMYLASE in the last 168 hours. No results for input(s): AMMONIA in the last 168 hours. Coagulation Profile: Recent Labs  Lab 08/12/17 1729  08/13/17 2206  INR 2.3* 2.14   Cardiac Enzymes: No results for input(s): CKTOTAL, CKMB, CKMBINDEX, TROPONINI in the last 168 hours. BNP (last 3 results) No results for input(s): PROBNP in the last 8760 hours. HbA1C: No results for input(s): HGBA1C in the last 72 hours. CBG: No results for input(s): GLUCAP in the last 168 hours. Lipid Profile: No results for input(s): CHOL, HDL, LDLCALC, TRIG, CHOLHDL, LDLDIRECT in the last 72 hours. Thyroid Function Tests: No results for input(s): TSH, T4TOTAL, FREET4, T3FREE, THYROIDAB in the last 72 hours. Anemia Panel: No results for input(s): VITAMINB12, FOLATE, FERRITIN, TIBC, IRON, RETICCTPCT in the last 72 hours. Sepsis Labs: No results for input(s): PROCALCITON, LATICACIDVEN in the last 168 hours.  No results found for this or any previous visit (from the past 240 hour(s)).   Radiology Studies: Dg Chest Portable 1 View  Result Date: 08/13/2017 CLINICAL  DATA:  Shortness of breath EXAM: PORTABLE CHEST 1 VIEW COMPARISON:  12/09/2016, 11/11/2016, 07/31/2016 FINDINGS: Rotated patient. Low lung volumes. Enlarged cardiomediastinal silhouette with vascular congestion and diffuse interstitial edema. Bibasilar infiltrates. Increased right hilar opacity similar compared to 2018 study, more prominent compared to 2017. No pneumothorax IMPRESSION: 1. Low lung volumes 2. Cardiomegaly with vascular congestion and diffuse interstitial edema 3. Right hilar opacity could be secondary to enlarged pulmonary vessels although mass is also considered. Correlation with contrast enhanced CT is suggested. Electronically Signed   By: Donavan Foil M.D.   On: 08/13/2017 23:25   Scheduled Meds: . bisoprolol  10 mg Oral Daily  . chlorhexidine  15 mL Mouth Rinse BID  . diltiazem  300 mg Oral Daily  . DULoxetine  60 mg Oral Daily  . famotidine  20 mg Oral BID  . ferrous sulfate  325 mg Oral BID WC  . furosemide  60 mg Intravenous Q8H  . guaiFENesin  600 mg Oral BID  .  levothyroxine  175 mcg Oral QAC breakfast  . mouth rinse  15 mL Mouth Rinse q12n4p  . metFORMIN  500 mg Oral TID WC & HS  . mometasone-formoterol  2 puff Inhalation BID  . pravastatin  40 mg Oral q1800  . sodium chloride flush  3 mL Intravenous Q12H   Continuous Infusions: . sodium chloride       LOS: 0 days   Kerney Elbe, DO [facility] Pager [phone]  If 7PM-7AM, please contact night-coverage [email or web address] Password [facility] 08/14/2017, 7:34 AM

## 2017-08-14 NOTE — Plan of Care (Signed)
Patient constantly pulls off bipap, explained she needed to keep her O2 level up, patient uncooperative with requests, explained importance of care. She prefers her way or no way. Will continue to monitor and support

## 2017-08-14 NOTE — Consult Note (Signed)
Cardiology Consult    Patient ID: Jillian Wood MRN: [id number], DOB/AGE: [id number]   Admit date: 08/13/2017 Date of Consult: 08/14/2017  Primary Physician: Unk Pinto, MD Primary Cardiologist: Gwenlyn Found  Requesting Provider: Alfredia Ferguson Reason for Consultation: CHF  Jillian Wood is a 72 y.o. female who is being seen today for the evaluation of CHF at the request of Dr. Alfredia Ferguson.   Patient Profile    71 yo female with PMH of HTN, HL, PAF, COPD, Morbid Obesity, DM, Hypothyroidism who presented with shortness of breath.   Past Medical History   Past Medical History:  Diagnosis Date  . Arthritis   . CHF (congestive heart failure) ([place])   . COPD (chronic obstructive pulmonary disease) ([place])   . Depression   . Diabetes mellitus type 2 in obese ([place])   . GERD (gastroesophageal reflux disease)   . Hyperlipidemia   . Hypertension   . Morbid obesity ([place])   . OAB (overactive bladder)   . PSVT (paroxysmal supraventricular tachycardia) ([place])   . Thyroid disease   . Vitamin D deficiency     Past Surgical History:  Procedure Laterality Date  . CARPAL TUNNEL RELEASE     L 1992 R 1984  . EYE SURGERY Bilateral    cataract  . NM MYOVIEW LTD  01/2011   Dobutamine Myoview: Negative perfusion scan for ischemia / infarct (poor image capture); Pt developed SVT with Dobutamine that reproduced her CP as it resolved with restoration of NSR.  Marland Kitchen PUBOVAGINAL SLING    . TEE WITHOUT CARDIOVERSION N/A 12/16/2016   Procedure: TRANSESOPHAGEAL ECHOCARDIOGRAM (TEE);  Surgeon: Sanda Klein, MD;  Location: [facility] ENDOSCOPY;  Service: Cardiovascular;  Laterality: N/A;  . TONSILLECTOMY AND ADENOIDECTOMY    . TRANSTHORACIC ECHOCARDIOGRAM  01/2011   Hyperdynamic LV with EF 65-70%, Gr 1 DD, Mild Ao Stenosis - mean gradient ~19 mmHg     Allergies  Allergies  Allergen Reactions  . Inderal [Propranolol] Other (See Comments)    Hair loss  . Ace Inhibitors Cough    History of Present Illness    Jillian Wood is a 12 female with PMH of HTN, HL, PAF, COPD, Morbid Obesity, DM, and Hypothyroidism.  She was last seen in the office on 06/19/17 by Rosaria Ferries.  At this appointment reported that her husband had recently died several months ago and was struggling with his death.  Stated she had not been sticking with her diet and noted that she had some weight gain.  She felt that her respiratory status was stable, but that her O2 sats have been dropping into the 80s at home.  Denies any chest pain on exertion, and was tolerating her Coumadin well.  Her Coumadin is followed by her PCP, and reports going for lab work yesterday.  She presented to the ED on 08/13/17 after being called by her PCP office and told her hemoglobin was 7.7.  When EMS arrived to her house she was significantly short of breath with O2 sats in the 80s.  She was placed on a nonrebreather and given a breathing treatment.  In the ED her labs showed stable electrolytes, creatinine 1.3, hemoglobin 8.1, INR 2.14, and she was occult stool positive.  EKG showed atrial flutter with rates in the 60s.  Chest x-ray showed cardiomegaly with diffuse edema.  She became acutely short of breath on the ER and required BiPAP.  She was started on IV Lasix, and admitted to internal medicine for further workup.  With the  patient she denies any symptoms prior to admission, no significant edema, chest pain, dizziness, lightheadedness or palpitations.  Inpatient Medications    . bisoprolol  10 mg Oral Daily  . chlorhexidine  15 mL Mouth Rinse BID  . diltiazem  300 mg Oral Daily  . DULoxetine  60 mg Oral Daily  . famotidine  20 mg Oral BID  . ferrous sulfate  325 mg Oral BID WC  . furosemide  60 mg Intravenous Q8H  . guaiFENesin  1,200 mg Oral BID  . ipratropium  0.5 mg Nebulization Q6H  . levalbuterol  0.63 mg Nebulization Q6H  . levothyroxine  175 mcg Oral QAC breakfast  . mouth rinse  15 mL Mouth Rinse q12n4p  . metFORMIN  500 mg Oral TID WC & HS  .  mometasone-formoterol  2 puff Inhalation BID  . pravastatin  40 mg Oral q1800  . sodium chloride flush  3 mL Intravenous Q12H    Family History    Family History  Problem Relation Age of Onset  . Alcohol abuse Father   . Heart disease Father     Social History    Social History   Socioeconomic History  . Marital status: Widowed    Spouse name: Not on file  . Number of children: Not on file  . Years of education: Not on file  . Highest education level: Not on file  Social Needs  . Financial resource strain: Not on file  . Food insecurity - worry: Not on file  . Food insecurity - inability: Not on file  . Transportation needs - medical: Not on file  . Transportation needs - non-medical: Not on file  Occupational History  . Occupation: retired  Tobacco Use  . Smoking status: Former Smoker    Packs/day: 0.50    Years: 44.00    Pack years: 22.00    Types: Cigarettes    Last attempt to quit: 07/31/2016    Years since quitting: 1.0  . Smokeless tobacco: Never Used  . Tobacco comment: smoke about 5-6 cigarette daily  Substance and Sexual Activity  . Alcohol use: No    Alcohol/week: 0.0 oz  . Drug use: No  . Sexual activity: Not on file  Other Topics Concern  . Not on file  Social History Narrative  . Not on file     Review of Systems    See HPI  All other systems reviewed and are otherwise negative except as noted above.  Physical Exam    Blood pressure 120/63, pulse 76, temperature 97.9 F (36.6 C), temperature source Oral, resp. rate 18, height 5\' 2"  (1.575 m), weight 255 lb 8.2 oz (115.9 kg), SpO2 98 %.  General: Pleasant, older white female NAD Psych: Normal affect. Neuro: Alert and oriented X 3. Moves all extremities spontaneously. HEENT: Normal  Neck: Supple + JVD Lungs:  Resp regular, mildly labored,  rhonchi in bilateral lower lobes. Heart: RRR no s3, s4, soft systolic murmur. Abdomen: Soft, non-tender, non-distended, BS + x 4.  Extremities: No  clubbing, cyanosis or edema. DP/PT/Radials 2+ and equal bilaterally.  Labs    Troponin (Point of Care Test) No results for input(s): TROPIPOC in the last 72 hours. No results for input(s): CKTOTAL, CKMB, TROPONINI in the last 72 hours. Lab Results  Component Value Date   WBC 10.0 08/13/2017   HGB 8.1 (L) 08/13/2017   HCT 30.7 (L) 08/13/2017   MCV 86.2 08/13/2017   PLT 422 (H) 08/13/2017  Recent Labs  Lab 08/13/17 2206  NA 137  K 4.3  CL 102  CO2 23  BUN 22*  CREATININE 1.30*  CALCIUM 8.7*  PROT 6.4*  BILITOT 0.2*  ALKPHOS 95  ALT 10*  AST 20  GLUCOSE 165*   Lab Results  Component Value Date   CHOL 139 05/26/2017   HDL 58 05/26/2017   LDLCALC 80 02/11/2017   TRIG 125 05/26/2017   No results found for: [facility]   Radiology Studies    Dg Chest Portable 1 View  Result Date: 08/13/2017 CLINICAL DATA:  Shortness of breath EXAM: PORTABLE CHEST 1 VIEW COMPARISON:  12/09/2016, 11/11/2016, 07/31/2016 FINDINGS: Rotated patient. Low lung volumes. Enlarged cardiomediastinal silhouette with vascular congestion and diffuse interstitial edema. Bibasilar infiltrates. Increased right hilar opacity similar compared to 2018 study, more prominent compared to 2017. No pneumothorax IMPRESSION: 1. Low lung volumes 2. Cardiomegaly with vascular congestion and diffuse interstitial edema 3. Right hilar opacity could be secondary to enlarged pulmonary vessels although mass is also considered. Correlation with contrast enhanced CT is suggested. Electronically Signed   By: Donavan Foil M.D.   On: 08/13/2017 23:25    ECG & Cardiac Imaging    EKG: Atrial Flutter, rate controlled.   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 Pt was on a High Flow   Nasal Cannula on 10L O2 sitting in high fowlers, technically   difficult study. - Left ventricle: Systolic function was vigorous. The estimated   ejection fraction was in the range of 65% to 70%. The study  is   not technically sufficient to allow evaluation of LV diastolic   function. - Aortic valve: Mildly calcified leaflets. There was no stenosis. - Mitral valve: Calcified annulus. 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 TEE is recommended for better   visualization. - Tricuspid valve: There was moderate regurgitation. - Pulmonary arteries: PA peak pressure: 61 mm Hg (S). - Inferior vena cava: The vessel was dilated. The respirophasic   diameter changes were blunted (< 50%), consistent with elevated   central venous pressure.  Impressions:  - Technically difficult study. Definity contrast given. LVEF   65-70%, calcified aortic and mitral annulus with a calcified   mitral leaflets and a mobile mass noted on the ventricular side   of the valve - this could be vegetation or thrombus. TEE is   recommended for further assessment. There is moderate TR, RVSP 61   mmHg, dilated IVC, no pericardial effusion.  Assessment & Plan    71 yo female with PMH of HTN, HL, PAF, COPD, Morbid Obesity, DM, Hypothyroidism who presented with shortness of breath.   1. Acute on chronic diastolic HF: Reports she did feel short of breath prior to admission. Reports being compliant with lasix dosing at home and did not notice any weight gain. CXR with edema on admission. Did decompensate in the ED and requiring Bipap.  -- echo pending -- would continue with IV lasix as pressure tolerates -- follow I&Os and weights  -- check BNP  2. Aflutter: Rates are controlled. Has been on coumadin for [place]. Compliant per her report. On Dilt 300mg  daily along with bisoprolol 10mg  daily. Would continue the same. -- CHADSvasc at least 5. Coumadin on hold given + hemoccult and anemia.   3. Acute blood loss anemia: + hemocult. Type and screen processed but transfusion held. Hgb improved from  7.7>8.1 on admission. Management per primary -- recheck CBC  now  4. Dyspnea: suspect this is multifactorial in the setting of COPD, anemia and CHF. Currently on Bipap and tolerating well. Reports being on 5-6lmp [place] at home.   5. HTN: blood pressures have been soft, but tolerating home medications. Would continue the same for now.   6. AKI: Cr 1.3 on admission. Monitor with diuresis.   Barnet Pall, NP-C Pager [phone] 08/14/2017, 12:59 PM   Attending Note:   The patient was seen and examined.  Agree with assessment and plan as noted above.  Changes made to the above note as needed.  Patient seen and independently examined with Reino Bellis, NP .   We discussed all aspects of the encounter. I agree with the assessment and plan as stated above.  1.  Acute on chronic diastolic congestive heart failure.  This is Isra[place] presents with worsening heart failure.  For the past month she has been very stressed about the recent death of her husband and has been eating lots of salty foods.  She has not made any effort to pay attention to her salt intake.  She presents with congestive heart failure symptoms.  She decompensated in the emergency room and required BiPAP.  She is been started on Lasix.  She seems to be diuresing fairly well.  2.  Atrial flutter: She is on Coumadin.  She now presents with anemia.  The Coumadin is currently on hold.  3.  Essential hypertension: Blood pressure readings were low in the emergency room.  If stabilized now.  Continue current medications.   I have spent a total of 40 minutes with patient reviewing hospital  notes , telemetry, EKGs, labs and examining patient as well as establishing an assessment and plan that was discussed with the patient. > 50% of time was spent in direct patient care.    Thayer Headings, Brooke Bonito., MD, [facility] 08/14/2017, 2:17 PM [address]  [place] Pager [phone]

## 2017-08-14 NOTE — ED Notes (Signed)
Per admitting holding on to start blood.

## 2017-08-14 NOTE — ED Notes (Signed)
In to help pt to restroom. Pt unable to stand. Pt gets sob while talking with this RN. Suggested trying pure wick. While putting pts legs up on the bed, pt became very sob and O2 sats dropped to 80% on NRB. Sat pt back up. Will check orders for cath and possibly bi-pap. Will let primary RN know also.

## 2017-08-14 NOTE — Progress Notes (Signed)
  Echocardiogram 2D Echocardiogram has been performed.  Yobani Schertzer G Coleman Kalas 08/14/2017, 2:26 PM

## 2017-08-14 NOTE — Plan of Care (Signed)
Still difficult to keep good seal on mask for bipap, pt talks and moves around a lot

## 2017-08-14 NOTE — Plan of Care (Signed)
Discussed foley with patient who states she cannot lay flat for [place] to put it in, Dr. Theone Murdoch aware

## 2017-08-15 ENCOUNTER — Inpatient Hospital Stay (HOSPITAL_COMMUNITY): Payer: PPO

## 2017-08-15 DIAGNOSIS — I4892 Unspecified atrial flutter: Secondary | ICD-10-CM

## 2017-08-15 DIAGNOSIS — D649 Anemia, unspecified: Secondary | ICD-10-CM

## 2017-08-15 DIAGNOSIS — R0602 Shortness of breath: Secondary | ICD-10-CM

## 2017-08-15 LAB — CBC WITH DIFFERENTIAL/PLATELET
Basophils Absolute: 0 10*3/uL (ref 0.0–0.1)
Basophils Relative: 0 %
EOS ABS: 0.1 10*3/uL (ref 0.0–0.7)
EOS PCT: 1 %
HCT: 27.8 % — ABNORMAL LOW (ref 36.0–46.0)
HEMOGLOBIN: 7.7 g/dL — AB (ref 12.0–15.0)
LYMPHS ABS: 0.9 10*3/uL (ref 0.7–4.0)
LYMPHS PCT: 13 %
MCH: 22.6 pg — AB (ref 26.0–34.0)
MCHC: 27.7 g/dL — AB (ref 30.0–36.0)
MCV: 81.5 fL (ref 78.0–100.0)
MONOS PCT: 8 %
Monocytes Absolute: 0.6 10*3/uL (ref 0.1–1.0)
Neutro Abs: 5.3 10*3/uL (ref 1.7–7.7)
Neutrophils Relative %: 78 %
PLATELETS: 312 10*3/uL (ref 150–400)
RBC: 3.41 MIL/uL — ABNORMAL LOW (ref 3.87–5.11)
RDW: 17 % — ABNORMAL HIGH (ref 11.5–15.5)
WBC: 6.9 10*3/uL (ref 4.0–10.5)

## 2017-08-15 LAB — GLUCOSE, CAPILLARY
GLUCOSE-CAPILLARY: 109 mg/dL — AB (ref 65–99)
GLUCOSE-CAPILLARY: 120 mg/dL — AB (ref 65–99)
GLUCOSE-CAPILLARY: 88 mg/dL (ref 65–99)
Glucose-Capillary: 106 mg/dL — ABNORMAL HIGH (ref 65–99)
Glucose-Capillary: 105 mg/dL — ABNORMAL HIGH (ref 65–99)
Glucose-Capillary: 104 mg/dL — ABNORMAL HIGH (ref 65–99)

## 2017-08-15 LAB — COMPREHENSIVE METABOLIC PANEL
ALK PHOS: 95 U/L (ref 38–126)
ALT: 20 U/L (ref 14–54)
ANION GAP: 11 (ref 5–15)
AST: 31 U/L (ref 15–41)
Albumin: 3.4 g/dL — ABNORMAL LOW (ref 3.5–5.0)
BUN: 29 mg/dL — ABNORMAL HIGH (ref 6–20)
CALCIUM: 8.7 mg/dL — AB (ref 8.9–10.3)
CO2: 29 mmol/L (ref 22–32)
CREATININE: 1.49 mg/dL — AB (ref 0.44–1.00)
Chloride: 95 mmol/L — ABNORMAL LOW (ref 101–111)
GFR, EST AFRICAN AMERICAN: 40 mL/min — AB (ref >60)
GFR, EST NON AFRICAN AMERICAN: 34 mL/min — AB (ref >60)
Glucose, Bld: 102 mg/dL — ABNORMAL HIGH (ref 65–99)
Potassium: 3.6 mmol/L (ref 3.5–5.1)
SODIUM: 135 mmol/L (ref 135–145)
Total Bilirubin: 0.5 mg/dL (ref 0.3–1.2)
Total Protein: 6.4 g/dL — ABNORMAL LOW (ref 6.5–8.1)

## 2017-08-15 LAB — PHOSPHORUS: PHOSPHORUS: 5.1 mg/dL — AB (ref 2.5–4.6)

## 2017-08-15 LAB — PREPARE RBC (CROSSMATCH)

## 2017-08-15 LAB — MAGNESIUM: MAGNESIUM: 1.7 mg/dL (ref 1.7–2.4)

## 2017-08-15 MED ORDER — SODIUM CHLORIDE 0.9 % IV SOLN
Freq: Once | INTRAVENOUS | Status: AC
  Administered 2017-08-15: 13:00:00 via INTRAVENOUS

## 2017-08-15 MED ORDER — FUROSEMIDE 10 MG/ML IJ SOLN
20.0000 mg | Freq: Once | INTRAMUSCULAR | Status: AC
  Administered 2017-08-15: 20 mg via INTRAVENOUS
  Filled 2017-08-15: qty 2

## 2017-08-15 NOTE — Consult Note (Signed)
   [facility] CM Inpatient Consult   08/15/2017  Jillian Wood Feb 08, 1946 [id number]  Came by to follow up on referral patient is receiving care from staff.  Will follow up at a more appropriate time.  For questions please contact:  Natividad Brood, RN BSN [facility] Liaison  [phone] business mobile phone Toll free office [phone]

## 2017-08-15 NOTE — Progress Notes (Signed)
Patient transferred to 6E-02 via bed and two techs, bipapa and all personal belongings sent. Patient A/O X 4.

## 2017-08-15 NOTE — Progress Notes (Signed)
PROGRESS NOTE    Jillian Wood  NLZ:[id number] DOB: 1945-11-04 DOA: 08/13/2017 PCP: Unk Pinto, MD  Brief Narrative:  Jillian Wood is a 71 y.o. female with hx of DJD, CHF, COPD, depression, DM2, HL, HTN, obesity who was at her PCP's office yest and had blood work done.  She was called today and told Hb was 7.7. When EMS arrived pt was SOB and O2 sats were mid 80's.  She was put on nonbreather mask and given albuterol 2.5 mg.  In ED BP 116/ 64, RR 23, HR 60 atrial fib.  CXR showed bilat pulm edema.  TRH to see for admission.   She has had worsening DOE and now can't walk across the room due to SOB.  She denies any CP, prod cough, hemoptysis.  +orthopnea.  Her legs are more filled w/ fluid then usual.  She lives at home w/ her 3 sons. Her husband died 53 mos ago. Admitted for Decompensated CHF and Acute on Chronic Respiratory Failure with Hypoxia. Found to be Anemic and FOBT +.  Assessment & Plan:   Principal Problem:   Acute on chronic diastolic CHF (congestive heart failure) (HCC) Active Problems:   Essential hypertension   Morbid obesity (HCC)   Acute on chronic respiratory failure with hypoxia (HCC)   Diabetes mellitus type 2 in obese (HCC)   OSA (obstructive sleep apnea)   Anemia   Acute renal insufficiency   DNR (do not resuscitate)   Dyspnea   Acute on chronic diastolic (congestive) heart failure (HCC)  Acute on Chronic Respiratory Failure with Hypoxia 2/2 to Pulmonary Edema and Decompensated Diastolic CHF -Wears Supplemental O2 at Home -Recurrent Problem and 3rd Admit this year -C/w BiPAP Support and Wean as Tolerated to Nasal Cannula -C/w Diuresis with IV 60 mg q8h; Will give additional 20 mg prior and in between transfusions of units. -Repeat CXR this AM showed Stable cardiomegaly and central pulmonary vascular congestion is noted with probable bilateral pulmonary edema. Hypoinflation of the lungs is noted with mild bibasilar subsegmental atelectasis -Incentive  Spirometer   Acute on Chronic Diastolic CHF -BNP is 902.4 -C/w IV Diuresis with 60 mg q8h -Strict I's/O's, Daily Weights -Repeat ECHO showed EF of 55-60% with Right Ventricular Systolic Function Reduction  -Patient is -3.085 Liters; Weight is essentially the same -C/w BiPAP support -Cardiology Consulted for further evaluation and Recommendations  Anemia / Guiac + stool  -Stool Guiac is +.  -Hold coumadin for now.  -Hemoglobin/Hct was now 7.7/27.8 -C/w Ferrous Sulfate 325 mg po BID -Will transfuse 2 units of pRBC's -Hold Coumadin for now  COPD  -C/w Supplemental O2 -C/w Xopenex/Atrovent q6h -Repeat CXR in AM   OSA  -Uses Bipap overnight at home  DM2  -On po Medications with Metformin 500 mg TIDwm and Bedtime at Home -Will Hold po Home Meds -Start Sensitive Novolog SSI [facility]  ParoxysmalAtrial Fib/ Atrial Flutter  -Rate controlled,  -Cont Zebeta and Dilt.  -Hold coumadin w/ Positive Hemoccult and Anemia  HTN  -Cont Zebeta and Diltiazem,  -Hold Losartan increased Creatinine   Acute Renal Insufficiency   -BUN/Cr went from 17/0.81 -> 22/1.30 -> 28/1.56 -> 29/1.49 -Hold ARB, prob d/t decomp CHF -C/w Diuresis and expect to improve -Repeat CMP in AM  Hypothyroidism -Check TSH and Free T4 -C/w Levothyroxine 175 mcg po Daily   HLD -C/w Pravastatin 40 mg po Daily  Anxiety/Depression -C/w Duloxetine 60 mg po Daily and Diazepam 5 mg q12hprn   DVT prophylaxis: SCDs; Coumadin held  for now and consider restarting in AM  Code Status: DO NOT RESUSCITATE Family Communication: No family at bedside Disposition Plan: Remain in SDU  Consultants:   Cardiology   Procedures:  ECHOCARDIOGRAM ------------------------------------------------------------------- Study Conclusions  - Left ventricle: The cavity size was normal. Wall thickness was   normal. Systolic function was normal. The estimated ejection   fraction was in the range of 55% to 60%. - Mitral valve: Calcified  annulus. Mildly thickened leaflets.   There was mild regurgitation. - Left atrium: The atrium was severely dilated. - Right ventricle: The cavity size was mildly dilated. Systolic   function was mildly to moderately reduced. - Right atrium: The atrium was severely dilated.   Antimicrobials:  Anti-infectives (From admission, onward)   None     Subjective: Seen and examined at bedside and she stated that she was breathing better. Still SOB but no CP. No lightheadedness or dizziness.   Objective: Vitals:   08/15/17 1600 08/15/17 1645 08/15/17 1700 08/15/17 1800  BP: (!) 109/92 116/64 124/61 110/69  Pulse: 79 81 65 80  Resp: (!) 21 19 15 20   Temp:  98.2 F (36.8 C)    TempSrc:  Oral    SpO2: 93% 90% 90% 92%  Weight:      Height:        Intake/Output Summary (Last 24 hours) at 08/15/2017 1931 Last data filed at 08/15/2017 1700 Gross per 24 hour  Intake 860 ml  Output 2900 ml  Net -2040 ml   Filed Weights   08/14/17 0416 08/15/17 0600  Weight: 115.9 kg (255 lb 8.2 oz) 115.7 kg (255 lb 1.2 oz)   Examination: Physical Exam:  Constitutional: WN/WD obese Caucasian female in mild respiratory distress Eyes: Sclerae anicteric. Lids and conjunctivae normal ENMT: External Ears and Nose appear normal. MMM Neck: Appears normal supple. Mild JVD Respiratory: Diminished to auscultation bilaterally with some crackles and rhonchi. No appreciable wheezing today. Slightly increased respiratory effort but no accessory muscles.  Cardiovascular: Irregular but rate controlled. 2/6 Systolic Murmur. Has Trace LE edema Abdomen:  Soft, NT, ND. No appreciable hepatosplenomegaly. Bowel sounds present x4. GU: Deferred Musculoskeletal: No contractures or cyanosis Skin: No rashes or lesions on a limited skin evaluation. Warm and dry Neurologic: CN 2-12 grossly intact with no appreciable focal deficits.  Psychiatric: Anxious mood and appropriate affect. Normal judgement and insight  Data Reviewed:  I have personally reviewed following labs and imaging studies  CBC: Recent Labs  Lab 08/12/17 1729 08/13/17 2206 08/14/17 1423 08/15/17 0249  WBC 6.8 10.0 7.6 6.9  NEUTROABS 5,345 8.9*  --  5.3  HGB 7.7* 8.1* 7.8* 7.7*  HCT 25.8* 30.7* 28.0* 27.8*  MCV 78.9* 86.2 82.4 81.5  PLT 359 422* 307 409   Basic Metabolic Panel: Recent Labs  Lab 08/12/17 1729 08/13/17 2206 08/14/17 2052 08/15/17 0249  NA 141 137 137 135  K 4.3 4.3 4.1 3.6  CL 103 102 98* 95*  CO2 30 23 30 29   GLUCOSE 136* 165* 113* 102*  BUN 17 22* 28* 29*  CREATININE 0.81 1.30* 1.56* 1.49*  CALCIUM 9.0 8.7* 8.8* 8.7*  MG  --   --   --  1.7  PHOS  --   --   --  5.1*   GFR: Estimated Creatinine Clearance: 41.7 mL/min (A) (by C-G formula based on SCr of 1.49 mg/dL (H)). Liver Function Tests: Recent Labs  Lab 08/13/17 2206 08/14/17 2052 08/15/17 0249  AST 20 31 31   ALT 10* 19  20  ALKPHOS 95 98 95  BILITOT 0.2* 0.5 0.5  PROT 6.4* 6.3* 6.4*  ALBUMIN 3.3* 3.3* 3.4*   No results for input(s): LIPASE, AMYLASE in the last 168 hours. No results for input(s): AMMONIA in the last 168 hours. Coagulation Profile: Recent Labs  Lab 08/12/17 1729 08/13/17 2206  INR 2.3* 2.14   Cardiac Enzymes: No results for input(s): CKTOTAL, CKMB, CKMBINDEX, TROPONINI in the last 168 hours. BNP (last 3 results) No results for input(s): PROBNP in the last 8760 hours. HbA1C: No results for input(s): HGBA1C in the last 72 hours. CBG: Recent Labs  Lab 08/14/17 2357 08/15/17 0332 08/15/17 0745 08/15/17 1212 08/15/17 1600  GLUCAP 88 106* 105* 109* 104*   Lipid Profile: No results for input(s): CHOL, HDL, LDLCALC, TRIG, CHOLHDL, LDLDIRECT in the last 72 hours. Thyroid Function Tests: No results for input(s): TSH, T4TOTAL, FREET4, T3FREE, THYROIDAB in the last 72 hours. Anemia Panel: No results for input(s): VITAMINB12, FOLATE, FERRITIN, TIBC, IRON, RETICCTPCT in the last 72 hours. Sepsis Labs: No results for input(s):  PROCALCITON, LATICACIDVEN in the last 168 hours.  Recent Results (from the past 240 hour(s))  MRSA PCR Screening     Status: None   Collection Time: 08/14/17  8:08 AM  Result Value Ref Range Status   MRSA by PCR NEGATIVE NEGATIVE Final    Comment:        The GeneXpert MRSA Assay (FDA approved for NASAL specimens only), is one component of a comprehensive MRSA colonization surveillance program. It is not intended to diagnose MRSA infection nor to guide or monitor treatment for MRSA infections.     Radiology Studies: Dg Chest Port 1 View  Result Date: 08/15/2017 CLINICAL DATA:  Shortness of breath. EXAM: PORTABLE CHEST 1 VIEW COMPARISON:  Radiograph August 13, 2017. FINDINGS: Stable cardiomegaly with central pulmonary vascular congestion is noted. Stable diffuse interstitial densities are noted most consistent with pulmonary edema. Hypoinflation of the lungs is noted with mild bibasilar subsegmental atelectasis. No pneumothorax is noted. Bony thorax is unremarkable. IMPRESSION: Stable cardiomegaly and central pulmonary vascular congestion is noted with probable bilateral pulmonary edema. Hypoinflation of the lungs is noted with mild bibasilar subsegmental atelectasis. Electronically Signed   By: Marijo Conception, M.D.   On: 08/15/2017 09:38   Dg Chest Portable 1 View  Result Date: 08/13/2017 CLINICAL DATA:  Shortness of breath EXAM: PORTABLE CHEST 1 VIEW COMPARISON:  12/09/2016, 11/11/2016, 07/31/2016 FINDINGS: Rotated patient. Low lung volumes. Enlarged cardiomediastinal silhouette with vascular congestion and diffuse interstitial edema. Bibasilar infiltrates. Increased right hilar opacity similar compared to 2018 study, more prominent compared to 2017. No pneumothorax IMPRESSION: 1. Low lung volumes 2. Cardiomegaly with vascular congestion and diffuse interstitial edema 3. Right hilar opacity could be secondary to enlarged pulmonary vessels although mass is also considered. Correlation  with contrast enhanced CT is suggested. Electronically Signed   By: Donavan Foil M.D.   On: 08/13/2017 23:25   Scheduled Meds: . bisoprolol  10 mg Oral Daily  . chlorhexidine  15 mL Mouth Rinse BID  . diltiazem  300 mg Oral Daily  . DULoxetine  60 mg Oral Daily  . famotidine  20 mg Oral BID  . ferrous sulfate  325 mg Oral BID WC  . furosemide  60 mg Intravenous Q8H  . guaiFENesin  1,200 mg Oral BID  . ipratropium  0.5 mg Nebulization Q6H  . levalbuterol  0.63 mg Nebulization Q6H  . levothyroxine  175 mcg Oral QAC breakfast  .  mouth rinse  15 mL Mouth Rinse q12n4p  . mometasone-formoterol  2 puff Inhalation BID  . pravastatin  40 mg Oral q1800  . sodium chloride flush  3 mL Intravenous Q12H   Continuous Infusions: . sodium chloride      LOS: 1 day   Kerney Elbe, DO [facility] Pager [phone]  If 7PM-7AM, please contact night-coverage [email or web address] Password [facility] 08/15/2017, 7:31 PM

## 2017-08-15 NOTE — Care Management Note (Signed)
Case Management Note  Patient Details  Name: Jillian Wood MRN: [id number] Date of Birth: 08-21-1946  Subjective/Objective:  Presents with CHF, Form home with son, [name], who is her caregiver 24/7.  She is w/chair bound, states she ambulates very little with a cane.  She has two other sons, also which one of them her caregiver also takes care him as well.  She has home oxygen with [employer] (6 to 8 liters, depending on what she is doing).  She has PCP , [place] and she has medication coverage.  NCM spoke with her about CHF Disease Management  With [facility], she states she does not wish to have this.                     Action/Plan: NCM will follow for dc needs.   Expected Discharge Date:                  Expected Discharge Plan:  [place]  In-House Referral:     Discharge planning Services  CM Consult  Post Acute Care Choice:    Choice offered to:     DME Arranged:    DME Agency:     HH Arranged:    [place] Agency:     Status of Service:  In process, will continue to follow  If discussed at Long Length of Stay Meetings, dates discussed:    Additional Comments:  Zenon Mayo, RN 08/15/2017, 11:51 AM

## 2017-08-15 NOTE — Progress Notes (Signed)
Progress Note  Patient Name: Jillian Wood Date of Encounter: 08/15/2017  Primary Cardiologist: Gwenlyn Found   Subjective   71 year old female who was admitted with symptoms consistent with CHF.  She has a history of hypertension, hyperlipidemia, paroxysmal atrial fibrillation, COPD, morbid obesity, diabetes mellitus, hypothyroidism.  This is [place] lost her husband about a month ago.  She has not been eating right since that time.  He is developed progressive shortness of breath over the past several weeks.  She is diuresed 2.1 L so far this admission.   Inpatient Medications    Scheduled Meds: . bisoprolol  10 mg Oral Daily  . chlorhexidine  15 mL Mouth Rinse BID  . diltiazem  300 mg Oral Daily  . DULoxetine  60 mg Oral Daily  . famotidine  20 mg Oral BID  . ferrous sulfate  325 mg Oral BID WC  . furosemide  60 mg Intravenous Q8H  . guaiFENesin  1,200 mg Oral BID  . ipratropium  0.5 mg Nebulization Q6H  . levalbuterol  0.63 mg Nebulization Q6H  . levothyroxine  175 mcg Oral QAC breakfast  . mouth rinse  15 mL Mouth Rinse q12n4p  . mometasone-formoterol  2 puff Inhalation BID  . pravastatin  40 mg Oral q1800  . sodium chloride flush  3 mL Intravenous Q12H   Continuous Infusions: . sodium chloride     PRN Meds: sodium chloride, acetaminophen, acetaminophen-codeine, diazepam, ondansetron (ZOFRAN) IV, sodium chloride flush   Vital Signs    Vitals:   08/15/17 0700 08/15/17 0723 08/15/17 0747 08/15/17 0800  BP: (!) 121/54 (!) 121/54  (!) 114/56  Pulse: 80 80  82  Resp:  (!) 21  18  Temp:   98.2 F (36.8 C)   TempSrc:   Oral   SpO2: 97% 95%  95%  Weight:      Height:        Intake/Output Summary (Last 24 hours) at 08/15/2017 9476 Last data filed at 08/15/2017 0600 Gross per 24 hour  Intake 100 ml  Output 2375 ml  Net -2275 ml   Filed Weights   08/14/17 0416 08/15/17 0600  Weight: 255 lb 8.2 oz (115.9 kg) 255 lb 1.2 oz (115.7 kg)    Telemetry      Atrial flutter with a controlled ventricular response- Personally Reviewed  ECG    Atrial Flutter with a controlled ventricular response- Personally Reviewed  Physical Exam   GEN: morbidly obese female, No acute distress.   Neck: No JVD Cardiac: RRR, no murmurs, rubs, or gallops.  Respiratory:  rales bilaterally  GI: Soft, nontender, non-distended  MS: No edema; No deformity. Neuro:  Nonfocal  Psych: Normal affect   Labs    Chemistry Recent Labs  Lab 08/13/17 2206 08/14/17 2052 08/15/17 0249  NA 137 137 135  K 4.3 4.1 3.6  CL 102 98* 95*  CO2 23 30 29   GLUCOSE 165* 113* 102*  BUN 22* 28* 29*  CREATININE 1.30* 1.56* 1.49*  CALCIUM 8.7* 8.8* 8.7*  PROT 6.4* 6.3* 6.4*  ALBUMIN 3.3* 3.3* 3.4*  AST 20 31 31   ALT 10* 19 20  ALKPHOS 95 98 95  BILITOT 0.2* 0.5 0.5  GFRNONAA 40* 32* 34*  GFRAA 47* 37* 40*  ANIONGAP 12 9 11      Hematology Recent Labs  Lab 08/13/17 2206 08/14/17 1423 08/15/17 0249  WBC 10.0 7.6 6.9  RBC 3.56* 3.40* 3.41*  HGB 8.1* 7.8* 7.7*  HCT 30.7* 28.0* 27.8*  MCV 86.2 82.4 81.5  MCH 22.8* 22.9* 22.6*  MCHC 26.4* 27.9* 27.7*  RDW 17.3* 16.9* 17.0*  PLT 422* 307 312    Cardiac EnzymesNo results for input(s): TROPONINI in the last 168 hours. No results for input(s): TROPIPOC in the last 168 hours.   BNP Recent Labs  Lab 08/14/17 1423  BNP 554.2*     DDimer No results for input(s): DDIMER in the last 168 hours.   Radiology    Dg Chest Portable 1 View  Result Date: 08/13/2017 CLINICAL DATA:  Shortness of breath EXAM: PORTABLE CHEST 1 VIEW COMPARISON:  12/09/2016, 11/11/2016, 07/31/2016 FINDINGS: Rotated patient. Low lung volumes. Enlarged cardiomediastinal silhouette with vascular congestion and diffuse interstitial edema. Bibasilar infiltrates. Increased right hilar opacity similar compared to 2018 study, more prominent compared to 2017. No pneumothorax IMPRESSION: 1. Low lung volumes 2. Cardiomegaly with vascular congestion and  diffuse interstitial edema 3. Right hilar opacity could be secondary to enlarged pulmonary vessels although mass is also considered. Correlation with contrast enhanced CT is suggested. Electronically Signed   By: Donavan Foil M.D.   On: 08/13/2017 23:25    Cardiac Studies     Patient Profile     71 y.o. female admitted with congestive heart failure  Assessment & Plan    1.  Congestive heart failure: She is diuresed 2.1 L so far.  Current medications.  2.  Atrial flutter: Her heart rate is well controlled.  Continue current medications.  Coumadin is on hold because of her anemia.  3.  Anemia: Hemoglobin is 7.7.  I would agree with the need for transfusing her.  This will help her heart failure.  She may need an extra dose of Lasix between units of blood. Coumadin is currently on hold. Other plans and management will be per the internal medicine team.   For questions or updates, please contact [place] Please consult [email or web address] for contact info under Cardiology/STEMI.      Signed, Mertie Moores, MD  08/15/2017, 9:17 AM

## 2017-08-15 NOTE — Progress Notes (Signed)
Attempted to call report, gave my name and numer to Network engineer. Nurse receiving patient in with another patient at the time.

## 2017-08-16 DIAGNOSIS — R791 Abnormal coagulation profile: Secondary | ICD-10-CM

## 2017-08-16 DIAGNOSIS — E871 Hypo-osmolality and hyponatremia: Secondary | ICD-10-CM

## 2017-08-16 DIAGNOSIS — R195 Other fecal abnormalities: Secondary | ICD-10-CM

## 2017-08-16 DIAGNOSIS — J9621 Acute and chronic respiratory failure with hypoxia: Secondary | ICD-10-CM

## 2017-08-16 LAB — CBC WITH DIFFERENTIAL/PLATELET
BASOS PCT: 0 %
Basophils Absolute: 0 10*3/uL (ref 0.0–0.1)
Eosinophils Absolute: 0.1 10*3/uL (ref 0.0–0.7)
Eosinophils Relative: 1 %
HEMATOCRIT: 35.1 % — AB (ref 36.0–46.0)
HEMOGLOBIN: 10.2 g/dL — AB (ref 12.0–15.0)
LYMPHS ABS: 1.2 10*3/uL (ref 0.7–4.0)
Lymphocytes Relative: 14 %
MCH: 23.6 pg — ABNORMAL LOW (ref 26.0–34.0)
MCHC: 29.1 g/dL — AB (ref 30.0–36.0)
MCV: 81.3 fL (ref 78.0–100.0)
MONO ABS: 0.8 10*3/uL (ref 0.1–1.0)
Monocytes Relative: 10 %
NEUTROS ABS: 6.2 10*3/uL (ref 1.7–7.7)
NEUTROS PCT: 75 %
Platelets: 307 10*3/uL (ref 150–400)
RBC: 4.32 MIL/uL (ref 3.87–5.11)
RDW: 16.5 % — AB (ref 11.5–15.5)
WBC: 8.2 10*3/uL (ref 4.0–10.5)

## 2017-08-16 LAB — COMPREHENSIVE METABOLIC PANEL WITH GFR
ALT: 26 U/L (ref 14–54)
AST: 34 U/L (ref 15–41)
Albumin: 3.6 g/dL (ref 3.5–5.0)
Alkaline Phosphatase: 104 U/L (ref 38–126)
Anion gap: 16 — ABNORMAL HIGH (ref 5–15)
BUN: 27 mg/dL — ABNORMAL HIGH (ref 6–20)
CO2: 32 mmol/L (ref 22–32)
Calcium: 8.5 mg/dL — ABNORMAL LOW (ref 8.9–10.3)
Chloride: 90 mmol/L — ABNORMAL LOW (ref 101–111)
Creatinine, Ser: 1.15 mg/dL — ABNORMAL HIGH (ref 0.44–1.00)
GFR calc Af Amer: 54 mL/min — ABNORMAL LOW
GFR calc non Af Amer: 47 mL/min — ABNORMAL LOW
Glucose, Bld: 96 mg/dL (ref 65–99)
Potassium: 3 mmol/L — ABNORMAL LOW (ref 3.5–5.1)
Sodium: 138 mmol/L (ref 135–145)
Total Bilirubin: 0.9 mg/dL (ref 0.3–1.2)
Total Protein: 6.8 g/dL (ref 6.5–8.1)

## 2017-08-16 LAB — TYPE AND SCREEN
ABO/RH(D): O POS
ANTIBODY SCREEN: NEGATIVE
Unit division: 0
Unit division: 0
Unit division: 0

## 2017-08-16 LAB — GLUCOSE, CAPILLARY
GLUCOSE-CAPILLARY: 115 mg/dL — AB (ref 65–99)
GLUCOSE-CAPILLARY: 128 mg/dL — AB (ref 65–99)
Glucose-Capillary: 118 mg/dL — ABNORMAL HIGH (ref 65–99)
Glucose-Capillary: 127 mg/dL — ABNORMAL HIGH (ref 65–99)
Glucose-Capillary: 230 mg/dL — ABNORMAL HIGH (ref 65–99)

## 2017-08-16 LAB — BPAM RBC
BLOOD PRODUCT EXPIRATION DATE: 201812202359
BLOOD PRODUCT EXPIRATION DATE: 201812212359
Blood Product Expiration Date: 201812202359
ISSUE DATE / TIME: 201811301231
ISSUE DATE / TIME: 201811301459
ISSUE DATE / TIME: 201811290017
UNIT TYPE AND RH: 5100
UNIT TYPE AND RH: 5100
Unit Type and Rh: 5100

## 2017-08-16 LAB — MAGNESIUM: Magnesium: 1.6 mg/dL — ABNORMAL LOW (ref 1.7–2.4)

## 2017-08-16 LAB — PHOSPHORUS: Phosphorus: 4.1 mg/dL (ref 2.5–4.6)

## 2017-08-16 LAB — T4, FREE: Free T4: 1.07 ng/dL (ref 0.61–1.12)

## 2017-08-16 LAB — TSH: TSH: 3.887 u[IU]/mL (ref 0.350–4.500)

## 2017-08-16 MED ORDER — MAGNESIUM SULFATE 2 GM/50ML IV SOLN
2.0000 g | Freq: Once | INTRAVENOUS | Status: AC
  Administered 2017-08-16: 2 g via INTRAVENOUS
  Filled 2017-08-16: qty 50

## 2017-08-16 MED ORDER — POTASSIUM CHLORIDE CRYS ER 20 MEQ PO TBCR
40.0000 meq | EXTENDED_RELEASE_TABLET | Freq: Two times a day (BID) | ORAL | Status: AC
  Administered 2017-08-16 (×2): 40 meq via ORAL
  Filled 2017-08-16 (×2): qty 2

## 2017-08-16 MED ORDER — POTASSIUM CHLORIDE CRYS ER 20 MEQ PO TBCR
40.0000 meq | EXTENDED_RELEASE_TABLET | ORAL | Status: AC
  Administered 2017-08-16: 40 meq via ORAL
  Filled 2017-08-16: qty 2

## 2017-08-16 MED ORDER — LEVALBUTEROL HCL 0.63 MG/3ML IN NEBU: 0.6300 mg | INHALATION_SOLUTION | RESPIRATORY_TRACT | Status: DC | PRN

## 2017-08-16 MED ORDER — ACETAMINOPHEN 10 MG/ML IV SOLN
1000.0000 mg | Freq: Four times a day (QID) | INTRAVENOUS | Status: AC
  Administered 2017-08-16 – 2017-08-17 (×3): 1000 mg via INTRAVENOUS
  Filled 2017-08-16 (×4): qty 100

## 2017-08-16 NOTE — Consult Note (Signed)
Referring Provider: [facility]   Primary Care Physician:  Unk Pinto, MD Primary Gastroenterologist:  Previously Erskine Emery,  MD  Reason for Consultation:  Anemia, heme positive stool   Attending physician's note   I have taken a history, examined the patient and reviewed the chart. I agree with the Advanced Practitioner's note, impression and recommendations.  Acute on chronic anemia with heme positive stool. No overt bleeding. History of adenomatous colon polyps A fib with chronic anticoagulation.  Acute on chronic resp failure due to CHF, COPD, OSA  Trend CBC, transfuse to keep Hb > 8, PPI daily, hold Coumadin, colonoscopy/EGD prior to discharge when cardiopulmonary status has improved. GI will see again on Monday- please call for questions/problems.    Lucio Edward, MD Marval Regal [phone] Mon-Fri 8a-5p [phone] after 5p, weekends, holidays   ASSESSMENT AND PLAN:   2.  71 year old female with multiple medical problems as described below.  She is chronically anticoagulated.  Admitted with shortness of breath, acute on chronic anemia and heme positive stool, Two gram drop in hemoglobin from low 10 range in October to around 8.  Hemoglobin back up to around 10 post 2 units of blood.  No overt bleeding at home.  INR therapeutic on admission. -Patient needs endoscopic workup when medically stable.  She should get an EGD given history of multiple duodenal erosions when last evaluated for iron deficiency anemia in 2011.  She should also undergo colonoscopy given history of tubular adenomatous colon polyps without high-grade dysplasia in 2011.  She is overdue for surveillance colonoscopy by a couple of years.  2.  Diastolic heart failure.  Cardiology following  3.  COPD, oxygen dependent.  Currently on high flow oxygen  4.  A. fib, rate controlled.  Coumadin on hold given anemia and Hemoccult-positive stool  5.  AKI, improving  6. Hypokalemia / low Mg+. Repletion in  progress     HPI: Jillian Wood is a 71 y.o. female with multiple medical problems not limited to COPD on home oxygen, DM 2, obesity, diastolic heart failure , A- fib/flutter, on home Coumadin.  Recently seen by PCP, called with report that hemoglobin was low.  Patient says the blood work was routine and she was not particularly short of breath or weak.  After being called with lab results patient apparently called EMS who arrived at her home and found her short of breath and hypoxic.  She was placed on a nonrebreather at 7 L . Hemoglobin at baseline and as of October was in the low 10 range, at PCPs office it was 7.7 .   In the ED showed cardiomegaly with vascular congestion and diffuse interstitial edema as well as a right hilar opacity, felt to be enlarged pulmonary vessels but mass is also consideration.  O2 saturation was 88%. Hemoglobin 8.1.  Found to be in acute renal failure with creatinine of 1.3 INR 2.3.   Difficult to obtain history right now patient is on high flow oxygen and I can hardly hear her through the mask . Known remotely to [place] (Dr. Deatra Ina). She was evaluated in 2011 for iron deficiency anemia.  Multiple duodenal erosions were found on EGD. Complete colonoscopy done same day with excellent bowel prep.  Two polyps were found in the cecum ranging from 3 mm to 8 mm in size.  Pathology compatible tubular adenomas without high-grade dysplasia.   A sessile polyp was found in the sigmoid colon, it was 1 cm in size and path c/w with  Hyperplastic polyp.  Small bowel biopsies were negative for including findings of celiac.  Patient denies any  Bowel changes.  She has had no abdominal pain, nausea vomiting or overt GI bleeding.  She takes Coumadin.  Denies NSAID use including asa though daily baby aspirin is on home med list.. Takes Pepcis BID for GERD   Past Medical History:  Diagnosis Date  . Arthritis   . CHF (congestive heart failure) ([place])   . COPD (chronic obstructive pulmonary  disease) ([place])   . Depression   . Diabetes mellitus type 2 in obese ([place])   . GERD (gastroesophageal reflux disease)   . Hyperlipidemia   . Hypertension   . Morbid obesity ([place])   . OAB (overactive bladder)   . PSVT (paroxysmal supraventricular tachycardia) ([place])   . Thyroid disease   . Vitamin D deficiency     Past Surgical History:  Procedure Laterality Date  . CARPAL TUNNEL RELEASE     L 1992 R 1984  . EYE SURGERY Bilateral    cataract  . NM MYOVIEW LTD  01/2011   Dobutamine Myoview: Negative perfusion scan for ischemia / infarct (poor image capture); Pt developed SVT with Dobutamine that reproduced her CP as it resolved with restoration of NSR.  Marland Kitchen PUBOVAGINAL SLING    . TEE WITHOUT CARDIOVERSION N/A 12/16/2016   Procedure: TRANSESOPHAGEAL ECHOCARDIOGRAM (TEE);  Surgeon: Sanda Klein, MD;  Location[facility] ENDOSCOPY;  Service: Cardiovascular;  Laterality: N/A;  . TONSILLECTOMY AND ADENOIDECTOMY    . TRANSTHORACIC ECHOCARDIOGRAM  01/2011   Hyperdynamic LV with EF 65-70%, Gr 1 DD, Mild Ao Stenosis - mean gradient ~19 mmHg    Prior to Admission medications   Medication Sig Start Date End Date Taking? Authorizing Provider  Acetaminophen (TYLENOL 8 HOUR ARTHRITIS PAIN PO) Take 1 tablet by mouth. PRN   Yes [provider]  acetaminophen-codeine (TYLENOL #3) 300-30 MG tablet Take 1 tablet by mouth every 8 (eight) hours as needed for moderate pain or severe pain. 11/19/16  Yes [name], PA-C  acetaZOLAMIDE (DIAMOX) 250 MG tablet TAKE 1 TABLET (250 MG TOTAL) BY MOUTH 3 (THREE) TIMES DAILY. 03/23/17  Yes Unk Pinto, MD  albuterol (PROVENTIL HFA;VENTOLIN HFA) 108 (90 Base) MCG/ACT inhaler Inhale 1-2 puffs into the lungs every 6 (six) hours as needed for wheezing or shortness of breath. 05/30/17  Yes [name], PA-C  aspirin 81 MG tablet Take 81 mg by mouth daily.   Yes [provider]  bisoprolol (ZEBETA) 10 MG tablet TAKE 1 TABLET (10 MG TOTAL) BY MOUTH DAILY.  06/19/17  Yes [name], PA-C  budesonide-formoterol [facility]) 160-4.5 MCG/ACT inhaler Inhale 2 puffs into the lungs 2 (two) times daily. 01/10/17  Yes Chesley Mires, MD  Cholecalciferol (VITAMIN D PO) Take 2,000 Units by mouth daily.    Yes [provider]  diazepam (VALIUM) 5 MG tablet Take one tablet up to 2 x a daily for anxiety AS NEEDED 03/20/17  Yes [name], PA-C  diltiazem (CARDIZEM CD) 300 MG 24 hr capsule TAKE 1 CAPSULE (300 MG TOTAL) BY MOUTH DAILY. 06/21/17  Yes Unk Pinto, MD  DULoxetine (CYMBALTA) 60 MG capsule TAKE 1 CAPSULE BY MOUTH DAILY Patient taking differently: TAKE 60 mg CAPSULE BY MOUTH DAILY 02/13/17  Yes Unk Pinto, MD  famotidine (PEPCID) 20 MG tablet TAKE 1 TABLET (20 MG TOTAL) BY MOUTH 2 (TWO) TIMES DAILY. FOR ACID REFLUX 06/25/17  Yes [name], PA-C  ferrous sulfate 325 (65 FE) MG tablet Take 325  mg by mouth 2 (two) times daily with a meal.   Yes [provider]  fluticasone (FLONASE) 50 MCG/ACT nasal spray Place 2 sprays into both nostrils daily. Patient taking differently: Place 2 sprays into both nostrils daily as needed for allergies.  10/29/16  Yes Forcucci, Courtney, PA-C  furosemide (LASIX) 80 MG tablet Take 1 tablet (80 mg total) by mouth daily. 08/12/17  Yes Unk Pinto, MD  guaiFENesin (MUCINEX) 600 MG 12 hr tablet Take 600 mg by mouth 2 (two) times daily.    Yes [provider]  levothyroxine (SYNTHROID, LEVOTHROID) 175 MCG tablet TAKE 1 TABLET (175 MCG TOTAL) BY MOUTH DAILY BEFORE BREAKFAST. 06/21/17  Yes Unk Pinto, MD  loratadine-pseudoephedrine (CLARITIN-D 24-HOUR) 10-240 MG per 24 hr tablet Take 1 tablet by mouth as needed for allergies.    Yes [provider]  losartan (COZAAR) 50 MG tablet TAKE 1 TABLET BY MOUTH DAILY Patient taking differently: TAKE 50 mg TABLET BY MOUTH DAILY 06/04/17  Yes [name], PA-C  Magnesium Chloride (MAGNESIUM DR PO) Take 1 capsule by mouth daily.     Yes [provider]  metFORMIN (GLUCOPHAGE-XR) 500 MG 24 hr tablet TAKE 1 TABLET (500 MG TOTAL) BY MOUTH 4 (FOUR) TIMES DAILY - AFTER MEALS AND AT BEDTIME. 04/29/17  Yes Unk Pinto, MD  OXYGEN-HELIUM IN Inhale 5-6 L into the lungs continuous.    Yes [provider]  pravastatin (PRAVACHOL) 40 MG tablet TAKE 1 TABLET (40 MG TOTAL) BY MOUTH DAILY. 04/08/17  Yes [name], PA-C  warfarin (COUMADIN) 5 MG tablet Take 1-1.5 tablets (5-7.5 mg total) by mouth daily at 6 PM. TAKES 7.5MG  ON TUES, THURS, SUNDAY TAKES 5MG  ALL OTHER DAYS 06/17/17  Yes Liane Comber, NP  budesonide (PULMICORT) 0.5 MG/2ML nebulizer solution Take 2 mLs (0.5 mg total) by nebulization 2 (two) times daily. Patient not taking: Reported on 08/12/2017 03/20/17   [name], PA-C    Current Facility-Administered Medications  Medication Dose Route Frequency Provider Last Rate Last Dose  . 0.9 %  sodium chloride infusion  250 mL Intravenous PRN Roney Jaffe, MD      . acetaminophen (TYLENOL) tablet 650 mg  650 mg Oral Q4H PRN Roney Jaffe, MD   650 mg at 08/14/17 2155  . acetaminophen-codeine (TYLENOL #3) 300-30 MG per tablet 1 tablet  1 tablet Oral Q8H PRN Roney Jaffe, MD   1 tablet at 08/16/17 [id number]  . bisoprolol (ZEBETA) tablet 10 mg  10 mg Oral Daily Roney Jaffe, MD   10 mg at 08/16/17 1007  . chlorhexidine (PERIDEX) 0.12 % solution 15 mL  15 mL Mouth Rinse BID Roney Jaffe, MD   15 mL at 08/16/17 1000  . diazepam (VALIUM) tablet 5 mg  5 mg Oral Q12H PRN Raiford Noble Latif, DO   5 mg at 08/15/17 2110  . diltiazem (CARDIZEM CD) 24 hr capsule 300 mg  300 mg Oral Daily Roney Jaffe, MD   300 mg at 08/16/17 1007  . DULoxetine (CYMBALTA) DR capsule 60 mg  60 mg Oral Daily Roney Jaffe, MD   60 mg at 08/16/17 0956  . famotidine (PEPCID) tablet 20 mg  20 mg Oral BID Roney Jaffe, MD   20 mg at 08/16/17 1000  . ferrous sulfate tablet 325 mg  325 mg Oral BID WC Roney Jaffe, MD    325 mg at 08/16/17 0957  . furosemide (LASIX) injection 60 mg  60 mg Intravenous Q8H Roney Jaffe, MD   60 mg  at 08/16/17 1300  . guaiFENesin (MUCINEX) 12 hr tablet 1,200 mg  1,200 mg Oral BID Raiford Noble Latif, DO   1,200 mg at 08/16/17 1000  . ipratropium (ATROVENT) nebulizer solution 0.5 mg  0.5 mg Nebulization Q6H Sheikh, Georgina Quint Dawson, DO   0.5 mg at 08/16/17 1303  . levalbuterol (XOPENEX) nebulizer solution 0.63 mg  0.63 mg Nebulization Q6H Sheikh, Georgina Quint [place], DO   0.63 mg at 08/16/17 1303  . levalbuterol (XOPENEX) nebulizer solution 0.63 mg  0.63 mg Nebulization Q4H PRN Dana Allan I, MD      . levothyroxine (SYNTHROID, LEVOTHROID) tablet 175 mcg  175 mcg Oral QAC breakfast Roney Jaffe, MD   100 mcg at 08/16/17 0803  . MEDLINE mouth rinse  15 mL Mouth Rinse q12n4p Roney Jaffe, MD   15 mL at 08/16/17 1218  . mometasone-formoterol (DULERA) 200-5 MCG/ACT inhaler 2 puff  2 puff Inhalation BID Roney Jaffe, MD   2 puff at 08/15/17 1957  . ondansetron (ZOFRAN) injection 4 mg  4 mg Intravenous Q6H PRN Roney Jaffe, MD      . potassium chloride SA (K-DUR,KLOR-CON) CR tablet 40 mEq  40 mEq Oral Q4H Dana Allan I, MD      . pravastatin (PRAVACHOL) tablet 40 mg  40 mg Oral q1800 Roney Jaffe, MD   40 mg at 08/15/17 1701  . sodium chloride flush (NS) 0.9 % injection 3 mL  3 mL Intravenous Q12H Roney Jaffe, MD   3 mL at 08/16/17 1000  . sodium chloride flush (NS) 0.9 % injection 3 mL  3 mL Intravenous PRN Roney Jaffe, MD        Allergies as of 08/13/2017 - Review Complete 08/13/2017  Allergen Reaction Noted  . Inderal [propranolol] Other (See Comments) 08/16/2013  . Ace inhibitors Cough 08/16/2013    Family History  Problem Relation Age of Onset  . Alcohol abuse Father   . Heart disease Father     Social History   Socioeconomic History  . Marital status: Widowed    Spouse name: Not on file  . Number of children: Not on file  . Years of education:  Not on file  . Highest education level: Not on file  Social Needs  . Financial resource strain: Not on file  . Food insecurity - worry: Not on file  . Food insecurity - inability: Not on file  . Transportation needs - medical: Not on file  . Transportation needs - non-medical: Not on file  Occupational History  . Occupation: retired  Tobacco Use  . Smoking status: Former Smoker    Packs/day: 0.50    Years: 44.00    Pack years: 22.00    Types: Cigarettes    Last attempt to quit: 07/31/2016    Years since quitting: 1.0  . Smokeless tobacco: Never Used  . Tobacco comment: smoke about 5-6 cigarette daily  Substance and Sexual Activity  . Alcohol use: No    Alcohol/week: 0.0 oz  . Drug use: No  . Sexual activity: Not on file  Other Topics Concern  . Not on file  Social History Narrative  . Not on file    Review of Systems: All systems reviewed and negative except where noted in HPI.  Physical Exam: Vital signs in last 24 hours: Temp:  [97.5 F (36.4 C)-98.8 F (37.1 C)] 98.8 F (37.1 C) (12/01 1150) Pulse Rate:  [55-103] 90 (12/01 1300) Resp:  [15-29] 29 (12/01 1300) BP: (97-124)/(59-92) 102/89 (12/01 1129) SpO2:  [  88 %-97 %] 94 % (12/01 1303) FiO2 (%):  [65 %] 65 % (12/01 1303) Weight:  [241 lb 10 oz (109.6 kg)] 241 lb 10 oz (109.6 kg) (12/01 0438) Last BM Date: 08/13/17 General:   Alert, obese white female in mild resp distress Psych:   Cooperative. Normal mood and affect. Eyes:  Pupils equal, sclera clear, no icterus.   Conjunctiva pink. Ears:  Normal auditory acuity. Nose:  No deformity, discharge,  or lesions. Neck:  Supple; no masses Lungs:  Clear throughout to auscultation.   No wheezes, crackles, or rhonchi.  Heart:  Normal rate, Irregular rhythm, no edema Abdomen:  Soft, obese, non-distended, nontender, BS active, no palp mass    Rectal:  Deferred  Msk:  Symmetrical without gross deformities. . Pulses:  Normal pulses noted. Neurologic:  Alert and   oriented x4;  grossly normal neurologically. Skin:  Intact without significant lesions or rashes..   Intake/Output from previous day: 11/30 0701 - 12/01 0700 In: 2956 [P.O.:360; I.V.:405; Blood:760; IV Piggyback:50] Out: 3100 [Urine:3100] Intake/Output this shift: No intake/output data recorded.  Lab Results: Recent Labs    08/14/17 1423 08/15/17 0249 08/16/17 0303  WBC 7.6 6.9 8.2  HGB 7.8* 7.7* 10.2*  HCT 28.0* 27.8* 35.1*  PLT 307 312 307   BMET Recent Labs    08/14/17 2052 08/15/17 0249 08/16/17 0303  NA 137 135 138  K 4.1 3.6 3.0*  CL 98* 95* 90*  CO2 30 29 32  GLUCOSE 113* 102* 96  BUN 28* 29* 27*  CREATININE 1.56* 1.49* 1.15*  CALCIUM 8.8* 8.7* 8.5*   LFT Recent Labs    08/16/17 0303  PROT 6.8  ALBUMIN 3.6  AST 34  ALT 26  ALKPHOS 104  BILITOT 0.9   PT/INR Recent Labs    08/13/17 2206  LABPROT 23.8*  INR 2.14    Studies/Results: Dg Chest Port 1 View  Result Date: 08/15/2017 CLINICAL DATA:  Shortness of breath. EXAM: PORTABLE CHEST 1 VIEW COMPARISON:  Radiograph August 13, 2017. FINDINGS: Stable cardiomegaly with central pulmonary vascular congestion is noted. Stable diffuse interstitial densities are noted most consistent with pulmonary edema. Hypoinflation of the lungs is noted with mild bibasilar subsegmental atelectasis. No pneumothorax is noted. Bony thorax is unremarkable. IMPRESSION: Stable cardiomegaly and central pulmonary vascular congestion is noted with probable bilateral pulmonary edema. Hypoinflation of the lungs is noted with mild bibasilar subsegmental atelectasis. Electronically Signed   By: Marijo Conception, M.D.   On: 08/15/2017 09:38    Tye Savoy, NP-C @  08/16/2017, 1:21 PM Pager number [phone]

## 2017-08-16 NOTE — Progress Notes (Addendum)
PROGRESS NOTE    Jillian Wood  GUR:[id number] DOB: 10-Jan-1946 DOA: 08/13/2017 PCP: Unk Pinto, MD  Brief Narrative:  Jillian Wood is a 71 y.o. female with hx of DJD, CHF, COPD, depression, DM2, HL, HTN, obesity who was at her PCP's office yest and had blood work done.  She was called today and told Hb was 7.7. When EMS arrived pt was SOB and O2 sats were mid 80's.  She was put on nonbreather mask and given albuterol 2.5 mg.  In ED BP 116/ 64, RR 23, HR 60 atrial fib.  CXR showed bilat pulm edema.  TRH to see for admission.   She has had worsening DOE and now can't walk across the room due to SOB.  She denies any CP, prod cough, hemoptysis.  +orthopnea.  Her legs are more filled w/ fluid then usual.  She lives at home w/ her 3 sons. Her husband died 81 mos ago. Admitted for Decompensated CHF and Acute on Chronic Respiratory Failure with Hypoxia. Found to be Anemic and FOBT +.  Assessment & Plan:   Principal Problem:   Acute on chronic diastolic CHF (congestive heart failure) (HCC) Active Problems:   Essential hypertension   Morbid obesity (HCC)   Acute on chronic respiratory failure with hypoxia (HCC)   Diabetes mellitus type 2 in obese (HCC)   OSA (obstructive sleep apnea)   Anemia   Acute renal insufficiency   DNR (do not resuscitate)   Dyspnea   Acute on chronic diastolic (congestive) heart failure (HCC)  Acute on Chronic Respiratory Failure with Hypoxia 2/2 to Pulmonary Edema and Decompensated Diastolic CHF -Wears Supplemental O2 at Home (6-8L/min according to the patient) -Recurrent Problem and 3rd Admit this year -C/w BiPAP Support and Wean as Tolerated to Nasal Cannula -C/w Diuresis with IV 60 mg q8h -Repeat CXR showed Stable cardiomegaly, central pulmonary vascular congestion and Hypoinflation of the lungs with mild bibasilar subsegmental atelectasis   Acute on Chronic Diastolic CHF -C/w IV Diuresis with 60 mg q8h -Strict I's/O's, Daily Weights -Repeat ECHO  showed EF of 55-60% with Right Ventricular Systolic Function Reduction   Anemia / Guiac + stool  -Stool Guiac is +.  -Hold coumadin for now.  -Hemoglobin/Hct was now 7.7/27.8 -C/w Ferrous Sulfate 325 mg po BID -Transfused 2 units of pRBC's -Coumadin on hold for now - Consult GI - Dr. Fuller Plan (Called in)  COPD  -C/w Supplemental O2 -C/w Xopenex/Atrovent q6h  OSA  -Uses Bipap overnight at home  DM2  -On po Medications with Metformin 500 mg TIDwm and Bedtime at Home -Will Hold po Home Meds -Start Sensitive Novolog SSI [facility]  ParoxysmalAtrial Fib/ Atrial Flutter  -Rate controlled,  -Hold coumadin w/ Positive Hemoccult and Anemia - Consult GI  HTN  - Optimize  Hypokalemia - Replete  Acute Renal Insufficiency   - Improving - Avoid Nephrotoxics - Renally dose all medications.  Hypothyroidism -Check TSH and Free T4 -C/w Levothyroxine 175 mcg po Daily   HLD -C/w Pravastatin 40 mg po Daily  Anxiety/Depression -C/w Duloxetine 60 mg po Daily and Diazepam 5 mg q12hprn   DVT prophylaxis: SCDs; Coumadin held for now and consider restarting in AM  Code Status: DO NOT RESUSCITATE Family Communication: No family at bedside Disposition Plan: Remain in SDU  Consultants:   Cardiology   Procedures:  ECHOCARDIOGRAM ------------------------------------------------------------------- Study Conclusions  - Left ventricle: The cavity size was normal. Wall thickness was   normal. Systolic function was normal. The estimated ejection  fraction was in the range of 55% to 60%. - Mitral valve: Calcified annulus. Mildly thickened leaflets.   There was mild regurgitation. - Left atrium: The atrium was severely dilated. - Right ventricle: The cavity size was mildly dilated. Systolic   function was mildly to moderately reduced. - Right atrium: The atrium was severely dilated.   Antimicrobials:  Anti-infectives (From admission, onward)   None     Subjective: Seen and examined at  bedside and she stated that she was breathing better. Still SOB but no CP. No lightheadedness or dizziness.   Objective: Vitals:   08/16/17 0438 08/16/17 0820 08/16/17 0852 08/16/17 0901  BP: 118/81 113/72    Pulse: 79 66    Resp: 19 (!) 21    Temp: 97.6 F (36.4 C)  97.9 F (36.6 C)   TempSrc: Axillary  Oral   SpO2: 91% (!) 89%  93%  Weight: 109.6 kg (241 lb 10 oz)     Height:        Intake/Output Summary (Last 24 hours) at 08/16/2017 1047 Last data filed at 08/16/2017 0646 Gross per 24 hour  Intake 1575 ml  Output 2500 ml  Net -925 ml   Filed Weights   08/14/17 0416 08/15/17 0600 08/16/17 0438  Weight: 115.9 kg (255 lb 8.2 oz) 115.7 kg (255 lb 1.2 oz) 109.6 kg (241 lb 10 oz)   Examination: Physical Exam:  Constitutional: WN/WD obese Caucasian female in mild respiratory distress Eyes: Sclerae anicteric. Lids and conjunctivae normal ENMT: External Ears and Nose appear normal. MMM Neck: Appears normal supple. Mild JVD Respiratory: Decreased air entry globally. No wheezing. Cardiovascular: Irregular but rate controlled. 2/6 Systolic Murmur. Has Trace LE edema Abdomen:  Soft, NT, ND. No appreciable hepatosplenomegaly. Bowel sounds present x4. GU: Deferred Musculoskeletal: No contractures or cyanosis Skin: No rashes or lesions on a limited skin evaluation. Warm and dry Neurologic: CN 2-12 grossly intact with no appreciable focal deficits.   Data Reviewed: I have personally reviewed following labs and imaging studies  CBC: Recent Labs  Lab 08/12/17 1729 08/13/17 2206 08/14/17 1423 08/15/17 0249 08/16/17 0303  WBC 6.8 10.0 7.6 6.9 8.2  NEUTROABS 5,345 8.9*  --  5.3 6.2  HGB 7.7* 8.1* 7.8* 7.7* 10.2*  HCT 25.8* 30.7* 28.0* 27.8* 35.1*  MCV 78.9* 86.2 82.4 81.5 81.3  PLT 359 422* 307 312 161   Basic Metabolic Panel: Recent Labs  Lab 08/12/17 1729 08/13/17 2206 08/14/17 2052 08/15/17 0249 08/16/17 0303  NA 141 137 137 135 138  K 4.3 4.3 4.1 3.6 3.0*  CL 103  102 98* 95* 90*  CO2 30 23 30 29  32  GLUCOSE 136* 165* 113* 102* 96  BUN 17 22* 28* 29* 27*  CREATININE 0.81 1.30* 1.56* 1.49* 1.15*  CALCIUM 9.0 8.7* 8.8* 8.7* 8.5*  MG  --   --   --  1.7 1.6*  PHOS  --   --   --  5.1* 4.1   GFR: Estimated Creatinine Clearance: 52.3 mL/min (A) (by C-G formula based on SCr of 1.15 mg/dL (H)). Liver Function Tests: Recent Labs  Lab 08/13/17 2206 08/14/17 2052 08/15/17 0249 08/16/17 0303  AST 20 31 31  34  ALT 10* 19 20 26   ALKPHOS 95 98 95 104  BILITOT 0.2* 0.5 0.5 0.9  PROT 6.4* 6.3* 6.4* 6.8  ALBUMIN 3.3* 3.3* 3.4* 3.6   No results for input(s): LIPASE, AMYLASE in the last 168 hours. No results for input(s): AMMONIA in the last 168  hours. Coagulation Profile: Recent Labs  Lab 08/12/17 1729 08/13/17 2206  INR 2.3* 2.14   Cardiac Enzymes: No results for input(s): CKTOTAL, CKMB, CKMBINDEX, TROPONINI in the last 168 hours. BNP (last 3 results) No results for input(s): PROBNP in the last 8760 hours. HbA1C: No results for input(s): HGBA1C in the last 72 hours. CBG: Recent Labs  Lab 08/15/17 1212 08/15/17 1600 08/15/17 2011 08/16/17 0442 08/16/17 0737  GLUCAP 109* 104* 120* 115* 118*   Lipid Profile: No results for input(s): CHOL, HDL, LDLCALC, TRIG, CHOLHDL, LDLDIRECT in the last 72 hours. Thyroid Function Tests: Recent Labs    08/16/17 0303  TSH 3.887  FREET4 1.07   Anemia Panel: No results for input(s): VITAMINB12, FOLATE, FERRITIN, TIBC, IRON, RETICCTPCT in the last 72 hours. Sepsis Labs: No results for input(s): PROCALCITON, LATICACIDVEN in the last 168 hours.  Recent Results (from the past 240 hour(s))  MRSA PCR Screening     Status: None   Collection Time: 08/14/17  8:08 AM  Result Value Ref Range Status   MRSA by PCR NEGATIVE NEGATIVE Final    Comment:        The GeneXpert MRSA Assay (FDA approved for NASAL specimens only), is one component of a comprehensive MRSA colonization surveillance program. It is  not intended to diagnose MRSA infection nor to guide or monitor treatment for MRSA infections.     Radiology Studies: Dg Chest Port 1 View  Result Date: 08/15/2017 CLINICAL DATA:  Shortness of breath. EXAM: PORTABLE CHEST 1 VIEW COMPARISON:  Radiograph August 13, 2017. FINDINGS: Stable cardiomegaly with central pulmonary vascular congestion is noted. Stable diffuse interstitial densities are noted most consistent with pulmonary edema. Hypoinflation of the lungs is noted with mild bibasilar subsegmental atelectasis. No pneumothorax is noted. Bony thorax is unremarkable. IMPRESSION: Stable cardiomegaly and central pulmonary vascular congestion is noted with probable bilateral pulmonary edema. Hypoinflation of the lungs is noted with mild bibasilar subsegmental atelectasis. Electronically Signed   By: Marijo Conception, M.D.   On: 08/15/2017 09:38   Scheduled Meds: . bisoprolol  10 mg Oral Daily  . chlorhexidine  15 mL Mouth Rinse BID  . diltiazem  300 mg Oral Daily  . DULoxetine  60 mg Oral Daily  . famotidine  20 mg Oral BID  . ferrous sulfate  325 mg Oral BID WC  . furosemide  60 mg Intravenous Q8H  . guaiFENesin  1,200 mg Oral BID  . ipratropium  0.5 mg Nebulization Q6H  . levalbuterol  0.63 mg Nebulization Q6H  . levothyroxine  175 mcg Oral QAC breakfast  . mouth rinse  15 mL Mouth Rinse q12n4p  . mometasone-formoterol  2 puff Inhalation BID  . potassium chloride  40 mEq Oral BID  . pravastatin  40 mg Oral q1800  . sodium chloride flush  3 mL Intravenous Q12H   Continuous Infusions: . sodium chloride      LOS: 2 days   Bonnell Public, MD [facility] Pager [phone]  If 7PM-7AM, please contact night-coverage [email or web address] Password [facility] 08/16/2017, 10:47 AM

## 2017-08-16 NOTE — Progress Notes (Signed)
RT Note: Rt just spoke to the patient's RN, Jiles Garter and she is going to transition the patient back onto her high flow nasal cannula. Patient has been maintaining well on her non rebreather mask with no complications. Rt will continue to assist as needed.

## 2017-08-16 NOTE — Progress Notes (Signed)
Called by RN for a second set of eyes.  On my arrival patient resting on BIPAP, HR 111, RR 20, sats94%.  RN states she just received valium and seems a little better.  Patient denies any complaints at this time.  No RRt interventions.  RN to call if assistance needed

## 2017-08-16 NOTE — Progress Notes (Signed)
Progress Note  Patient Name: Jillian Wood Date of Encounter: 08/16/2017  Primary Cardiologist: Gwenlyn Found  Subjective   Substantial improvement since admission, not at baseline.  Still on high flow oxygen. Net diuresis 3.7 L since admission.  Weight reportedly down 14 pounds, but probably a measurement error.  Weight was 246 pounds when she saw Dr. Alvester Chou May, 256 pounds when she saw Rosaria Ferries in October. Baseline BNP appears to be 350 or less.  Inpatient Medications    Scheduled Meds: . bisoprolol  10 mg Oral Daily  . chlorhexidine  15 mL Mouth Rinse BID  . diltiazem  300 mg Oral Daily  . DULoxetine  60 mg Oral Daily  . famotidine  20 mg Oral BID  . ferrous sulfate  325 mg Oral BID WC  . furosemide  60 mg Intravenous Q8H  . guaiFENesin  1,200 mg Oral BID  . ipratropium  0.5 mg Nebulization Q6H  . levalbuterol  0.63 mg Nebulization Q6H  . levothyroxine  175 mcg Oral QAC breakfast  . mouth rinse  15 mL Mouth Rinse q12n4p  . mometasone-formoterol  2 puff Inhalation BID  . potassium chloride  40 mEq Oral BID  . pravastatin  40 mg Oral q1800  . sodium chloride flush  3 mL Intravenous Q12H   Continuous Infusions: . sodium chloride     PRN Meds: sodium chloride, acetaminophen, acetaminophen-codeine, diazepam, ondansetron (ZOFRAN) IV, sodium chloride flush   Vital Signs    Vitals:   08/16/17 0438 08/16/17 0820 08/16/17 0852 08/16/17 0901  BP: 118/81 113/72    Pulse: 79 66    Resp: 19 (!) 21    Temp: 97.6 F (36.4 C)  97.9 F (36.6 C)   TempSrc: Axillary  Oral   SpO2: 91% (!) 89%  93%  Weight: 241 lb 10 oz (109.6 kg)     Height:        Intake/Output Summary (Last 24 hours) at 08/16/2017 1117 Last data filed at 08/16/2017 0646 Gross per 24 hour  Intake 1575 ml  Output 2500 ml  Net -925 ml   Filed Weights   08/14/17 0416 08/15/17 0600 08/16/17 0438  Weight: 255 lb 8.2 oz (115.9 kg) 255 lb 1.2 oz (115.7 kg) 241 lb 10 oz (109.6 kg)    Telemetry    Atrial  fibrillation with very coarse fibrillatory waves, well rate controlled- Personally Reviewed  ECG    Atrial fibrillation, right bundle branch block,- Personally Reviewed  Physical Exam  Obese.  Looks comfortable sitting straight up at  bedside.  Not cyanotic. GEN: No acute distress.   Neck: Very difficult to see JVD Cardiac:  Irregular, 2/6 early peaking systolic ejection murmur in the aortic focus no diastolic murmurs, rubs, or gallops.  Respiratory: Clear to auscultation bilaterally. GI: Soft, nontender, non-distended  MS: No edema, compression devices on; No deformity. Neuro:  Nonfocal  Psych: Normal affect   Labs    Chemistry Recent Labs  Lab 08/14/17 2052 08/15/17 0249 08/16/17 0303  NA 137 135 138  K 4.1 3.6 3.0*  CL 98* 95* 90*  CO2 30 29 32  GLUCOSE 113* 102* 96  BUN 28* 29* 27*  CREATININE 1.56* 1.49* 1.15*  CALCIUM 8.8* 8.7* 8.5*  PROT 6.3* 6.4* 6.8  ALBUMIN 3.3* 3.4* 3.6  AST 31 31 34  ALT 19 20 26   ALKPHOS 98 95 104  BILITOT 0.5 0.5 0.9  GFRNONAA 32* 34* 47*  GFRAA 37* 40* 54*  ANIONGAP 9 11 16*  Hematology Recent Labs  Lab 08/14/17 1423 08/15/17 0249 08/16/17 0303  WBC 7.6 6.9 8.2  RBC 3.40* 3.41* 4.32  HGB 7.8* 7.7* 10.2*  HCT 28.0* 27.8* 35.1*  MCV 82.4 81.5 81.3  MCH 22.9* 22.6* 23.6*  MCHC 27.9* 27.7* 29.1*  RDW 16.9* 17.0* 16.5*  PLT 307 312 307    Cardiac EnzymesNo results for input(s): TROPONINI in the last 168 hours. No results for input(s): TROPIPOC in the last 168 hours.   BNP Recent Labs  Lab 08/14/17 1423  BNP 554.2*     DDimer No results for input(s): DDIMER in the last 168 hours.   Radiology    Dg Chest Port 1 View  Result Date: 08/15/2017 CLINICAL DATA:  Shortness of breath. EXAM: PORTABLE CHEST 1 VIEW COMPARISON:  Radiograph August 13, 2017. FINDINGS: Stable cardiomegaly with central pulmonary vascular congestion is noted. Stable diffuse interstitial densities are noted most consistent with pulmonary edema.  Hypoinflation of the lungs is noted with mild bibasilar subsegmental atelectasis. No pneumothorax is noted. Bony thorax is unremarkable. IMPRESSION: Stable cardiomegaly and central pulmonary vascular congestion is noted with probable bilateral pulmonary edema. Hypoinflation of the lungs is noted with mild bibasilar subsegmental atelectasis. Electronically Signed   By: Marijo Conception, M.D.   On: 08/15/2017 09:38    Cardiac Studies   Echo 08/14/2017 - Left ventricle: The cavity size was normal. Wall thickness was   normal. Systolic function was normal. The estimated ejection   fraction was in the range of 55% to 60%. - Mitral valve: Calcified annulus. Mildly thickened leaflets .   There was mild regurgitation. - Left atrium: The atrium was severely dilated. - Right ventricle: The cavity size was mildly dilated. Systolic   function was mildly to moderately reduced. - Right atrium: The atrium was severely dilated.   Patient Profile     71 y.o. female with acute on chronic diastolic heart failure in the setting of permanent atrial fibrillation, moderate to severe anemia, severe oxygen dependent COPD, noncompliance with dietary sodium restriction, following her husband's demise.  Assessment & Plan    1. CHF: Strongly doubt today's weight is accurate.  As far as I can tell her "dry weight" is around 240 pounds on her home scale.  Very difficult to assess lung status due to obesity and concomitant lung disease.  Can also use BNP, will shoot for a target BNP of 350 or less.  Continue IV diuretics today, potentially switch to oral diuretics tomorrow. 2. COPD: Severe, oxygen dependent.  The goal will be to get her off high flow oxygen. 3. Anemia: Improved and stable since transfusion. 4.  AFib: Appropriate rate control.  Anticoagulation currently on hold. Hard to assess risk-benefit of anticoagulation without knowing the cause of her anemia.  For questions or updates, please contact [place] Please consult [email or web address] for contact info under Cardiology/STEMI.      Signed, Sanda Klein, MD  08/16/2017, 11:17 AM

## 2017-08-16 NOTE — Progress Notes (Signed)
Rt Note: RT came to check on the patient and check on her Bipap machine. When RT came into the room the patient's Spo2 was 88%. The patient had a large leak so RT adjusted her mask which fixed the leak. The patient then pulled her mask off and started asking for water. She refused to put her mask back on and de saturated down to 70%. RT quickly placed her on a non re-breather mask at 100% O2 and she came up to 94%. Rt notified the patient that if she desaturated again, we would need to place her back on her Bipap machine and she agreed that she would go back on if needed after she gets some ice water. She is presently maintaining well on her oxygen mask and her Spo2 is in the upper 90's now. Rt will continue to monitor and assist as needed.

## 2017-08-16 NOTE — Progress Notes (Signed)
Pt was placed on Bipap per RT at 1300.  O2 sats 83-88% and pt feeling very uncomfortable and weak.  Scheduled dose of  IV lasix given early at same time.  About one hour later, pt was very anxious and stated, "Please don't leave me, I don't want to die.  I don't feel good."  Prn Valium was given and pt feeling much better now.  Rapid Response was notified.  Will let MD know also.  Pt continues to be on Bipap now and has put out a total of 575 cc Urine since 1300 lasix dose.  She is resting now and O2 sats 88-92% on Bipap.

## 2017-08-16 NOTE — Evaluation (Signed)
Physical Therapy Evaluation Patient Details Name: Jillian Wood MRN: [id number] DOB: Jan 12, 1946 Today's Date: 08/16/2017   History of Present Illness  71 y.o. female with acute on chronic diastolic heart failure in the setting of permanent atrial fibrillation, moderate to severe anemia, severe oxygen dependent COPD, noncompliance with dietary sodium restriction, following her husband's demise.  Clinical Impression  Pt admitted with the above complications. Pt currently with functional limitations due to the deficits listed below (see PT Problem List). Initially anxious however after a long discussion and education patient was at ease and able to mobilize in bed and stand at [facility] with minimal to supervision assist. She was eager to learn several exercises for her lower extremities and a handout was provided. She is fearful of going to a SNF but states she cannot afford HHPT as she has tried to have this ordered in the past. She has been relatively sedentary and I fear if she does not get some type of continued physical therapy after discharge she will continue to decline functionally. Pt will benefit from skilled PT to increase their independence and safety with mobility to allow discharge to the venue listed below.       Follow Up Recommendations Home health PT;Supervision for mobility/OOB(States she cannot afford HHPT, has tried to get in the past.)    Equipment Recommendations  None recommended by PT    Recommendations for Other Services OT consult     Precautions / Restrictions Precautions Precautions: Fall Restrictions Weight Bearing Restrictions: No      Mobility  Bed Mobility Overal bed mobility: Needs Assistance Bed Mobility: Supine to Sit;Sit to Supine     Supine to sit: Min guard;HOB elevated Sit to supine: Min assist;HOB elevated   General bed mobility comments: Pt able to come to EOB with encouragement and cues for technique. She did require min assist for LE support  to get back into bed.  Transfers Overall transfer level: Needs assistance Equipment used: Rolling walker (2 wheeled) Transfers: Sit to/from Stand Sit to Stand: Min guard         General transfer comment: Min guard for safety. Cues for hand placement. Slow but steady power-up to stand without physical assist.  Ambulation/Gait                Stairs            Wheelchair Mobility    Modified Rankin (Stroke Patients Only)       Balance Overall balance assessment: Needs assistance Sitting-balance support: No upper extremity supported Sitting balance-Leahy Scale: Fair Sitting balance - Comments: Tolerated sitting EOB approx 15 min   Standing balance support: Single extremity supported Standing balance-Leahy Scale: Poor Standing balance comment: Pt tolerated standing EOB approx 3 minutes. To anxious to allow further balance testing but able to stand with 1UE support lightly.                             Pertinent Vitals/Pain Pain Assessment: No/denies pain    Home Living Family/patient expects to be discharged to:: Private residence Living Arrangements: Children Available Help at Discharge: Family;Available 24 hours/day Type of Home: House Home Access: Ramped entrance     Home Layout: One level Home Equipment: Cane - single point;Shower seat;Walker - 4 wheels;Bedside commode;Wheelchair - power      Prior Function Level of Independence: Needs assistance   Gait / Transfers Assistance Needed: ambulates short household distances with RW  ADL's /  Homemaking Assistance Needed: Son assists with ADLs, only leaves house for MD appointments        Hand Dominance        Extremity/Trunk Assessment   Upper Extremity Assessment Upper Extremity Assessment: Defer to OT evaluation    Lower Extremity Assessment Lower Extremity Assessment: Generalized weakness       Communication      Cognition Arousal/Alertness: Awake/alert Behavior During  Therapy: Anxious Overall Cognitive Status: Within Functional Limits for tasks assessed                                 General Comments: Patient anxious initially, afraid to move, but eventually was calm and appreciative of PT visit.      General Comments General comments (skin integrity, edema, etc.): HR up to 122 max during therapy, SpO2 in low 90s throughout on hi flow O2    Exercises General Exercises - Lower Extremity Ankle Circles/Pumps: AROM;Both;20 reps;Supine Quad Sets: Strengthening;Both;20 reps;Supine Gluteal Sets: Strengthening;Both;20 reps;Supine Straight Leg Raises: Both;Supine;10 reps;Strengthening   Assessment/Plan    PT Assessment Patient needs continued PT services  PT Problem List Decreased strength;Decreased activity tolerance;Decreased balance;Decreased mobility;Decreased knowledge of use of DME;Cardiopulmonary status limiting activity;Obesity       PT Treatment Interventions DME instruction;Gait training;Functional mobility training;Therapeutic activities;Therapeutic exercise;Balance training;Neuromuscular re-education;Patient/family education    PT Goals (Current goals can be found in the Care Plan section)  Acute Rehab PT Goals Patient Stated Goal: Go home, get a little stronger PT Goal Formulation: With patient Time For Goal Achievement: 08/30/17 Potential to Achieve Goals: Good    Frequency Min 3X/week   Barriers to discharge        Co-evaluation               AM-PAC PT "6 Clicks" Daily Activity  Outcome Measure Difficulty turning over in bed (including adjusting bedclothes, sheets and blankets)?: A Little Difficulty moving from lying on back to sitting on the side of the bed? : A Little Difficulty sitting down on and standing up from a chair with arms (e.g., wheelchair, bedside commode, etc,.)?: A Little Help needed moving to and from a bed to chair (including a wheelchair)?: A Lot Help needed walking in hospital room?: A  Lot Help needed climbing 3-5 steps with a railing? : Total 6 Click Score: 14    End of Session Equipment Utilized During Treatment: Gait belt;Oxygen Activity Tolerance: Treatment limited secondary to medical complications (Comment)(To anxious to ambulate at this time.) Patient left: in bed;with call bell/phone within reach;with bed alarm set Nurse Communication: Mobility status PT Visit Diagnosis: Muscle weakness (generalized) (M62.81);Difficulty in walking, not elsewhere classified (R26.2)    Time: 6578-4696 PT Time Calculation (min) (ACUTE ONLY): 27 min   Charges:   PT Evaluation $PT Eval Moderate Complexity: 1 Mod PT Treatments $Therapeutic Activity: 8-22 mins   PT G Codes:        Elayne Snare, PT, DPT  Ellouise Newer 08/16/2017, 4:12 PM

## 2017-08-17 ENCOUNTER — Encounter: Payer: Self-pay | Admitting: Internal Medicine

## 2017-08-17 LAB — CBC
HCT: 34.7 % — ABNORMAL LOW (ref 36.0–46.0)
Hemoglobin: 10 g/dL — ABNORMAL LOW (ref 12.0–15.0)
MCH: 24 pg — ABNORMAL LOW (ref 26.0–34.0)
MCHC: 28.8 g/dL — ABNORMAL LOW (ref 30.0–36.0)
MCV: 83.2 fL (ref 78.0–100.0)
Platelets: 289 10*3/uL (ref 150–400)
RBC: 4.17 MIL/uL (ref 3.87–5.11)
RDW: 16.9 % — ABNORMAL HIGH (ref 11.5–15.5)
WBC: 7.2 10*3/uL (ref 4.0–10.5)

## 2017-08-17 LAB — RENAL FUNCTION PANEL
Albumin: 3.3 g/dL — ABNORMAL LOW (ref 3.5–5.0)
Anion gap: 11 (ref 5–15)
BUN: 32 mg/dL — ABNORMAL HIGH (ref 6–20)
CO2: 33 mmol/L — ABNORMAL HIGH (ref 22–32)
Calcium: 8.4 mg/dL — ABNORMAL LOW (ref 8.9–10.3)
Chloride: 92 mmol/L — ABNORMAL LOW (ref 101–111)
Creatinine, Ser: 1.15 mg/dL — ABNORMAL HIGH (ref 0.44–1.00)
GFR calc Af Amer: 54 mL/min — ABNORMAL LOW (ref >60)
GFR calc non Af Amer: 47 mL/min — ABNORMAL LOW (ref >60)
Glucose, Bld: 116 mg/dL — ABNORMAL HIGH (ref 65–99)
Phosphorus: 3.7 mg/dL (ref 2.5–4.6)
Potassium: 3.3 mmol/L — ABNORMAL LOW (ref 3.5–5.1)
Sodium: 136 mmol/L (ref 135–145)

## 2017-08-17 LAB — GLUCOSE, CAPILLARY
GLUCOSE-CAPILLARY: 112 mg/dL — AB (ref 65–99)
GLUCOSE-CAPILLARY: 124 mg/dL — AB (ref 65–99)
GLUCOSE-CAPILLARY: 135 mg/dL — AB (ref 65–99)
GLUCOSE-CAPILLARY: 80 mg/dL (ref 65–99)
Glucose-Capillary: 113 mg/dL — ABNORMAL HIGH (ref 65–99)
Glucose-Capillary: 188 mg/dL — ABNORMAL HIGH (ref 65–99)
Glucose-Capillary: 194 mg/dL — ABNORMAL HIGH (ref 65–99)

## 2017-08-17 LAB — BRAIN NATRIURETIC PEPTIDE: B Natriuretic Peptide: 610.4 pg/mL — ABNORMAL HIGH (ref 0.0–100.0)

## 2017-08-17 MED ORDER — POTASSIUM CHLORIDE 10 MEQ/100ML IV SOLN
10.0000 meq | INTRAVENOUS | Status: AC
  Administered 2017-08-17 (×2): 10 meq via INTRAVENOUS
  Filled 2017-08-17 (×2): qty 100

## 2017-08-17 MED ORDER — POTASSIUM CHLORIDE CRYS ER 20 MEQ PO TBCR
40.0000 meq | EXTENDED_RELEASE_TABLET | ORAL | Status: AC
  Administered 2017-08-17 (×2): 40 meq via ORAL
  Filled 2017-08-17 (×2): qty 2

## 2017-08-17 NOTE — Plan of Care (Signed)
Verbalizes understanding of POC.

## 2017-08-17 NOTE — Progress Notes (Signed)
PROGRESS NOTE    Jillian Wood  CZY:[id number] DOB: [birth year] DOA: 08/13/2017 PCP: Unk Pinto, MD  Brief Narrative:  Jillian Wood is a 71 y.o. female with hx of DJD, CHF, COPD, depression, DM2, HL, HTN, obesity who was at her PCP's office yest and had blood work done.  She was called today and told Hb was 7.7. When EMS arrived pt was SOB and O2 sats were mid 80's.  She was put on nonbreather mask and given albuterol 2.5 mg.  In ED BP 116/ 64, RR 23, HR 60 atrial fib.  CXR showed bilat pulm edema.  TRH to see for admission.   She has had worsening DOE and now can't walk across the room due to SOB.  She denies any CP, prod cough, hemoptysis.  +orthopnea.  Her legs are more filled w/ fluid then usual.  She lives at home w/ her 3 sons. Her husband died 66 mos ago. Admitted for Decompensated CHF and Acute on Chronic Respiratory Failure with Hypoxia. Found to be Anemic and FOBT +.  08/17/17 - Patient's coumadin is on hold as fecal occult blood came back positive. Discussed with GI yesterday, EGD and colonoscopy planned once respiratory and cardiac status stabilizes. Patient seen alongside patient's son. No new complaints today. Cardiology input is appreciated. Still on IV diuresis. Slowly improving.  Assessment & Plan:   Principal Problem:   Acute on chronic diastolic CHF (congestive heart failure) (HCC) Active Problems:   Essential hypertension   Morbid obesity (HCC)   Acute on chronic respiratory failure with hypoxia (HCC)   Diabetes mellitus type 2 in obese (HCC)   OSA (obstructive sleep apnea)   Anemia   Acute renal insufficiency   DNR (do not resuscitate)   Dyspnea   Acute on chronic diastolic (congestive) heart failure (HCC)  Acute on Chronic Respiratory Failure with Hypoxia 2/2 to Pulmonary Edema and Decompensated Diastolic CHF -Wears Supplemental O2 at Home (6-8L/min according to the patient) -Recurrent Problem and 3rd Admit this year -C/w BiPAP Support and Wean as  Tolerated to Nasal Cannula -C/w Diuresis with IV 60 mg q8h -Repeat CXR showed Stable cardiomegaly, central pulmonary vascular congestion and Hypoinflation of the lungs with mild bibasilar subsegmental atelectasis   Acute on Chronic Diastolic CHF -C/w IV Diuresis with 60 mg q8h -Strict I's/O's, Daily Weights -Repeat ECHO showed EF of 55-60% with Right Ventricular Systolic Function Reduction   Anemia / Guiac + stool  -Stool Guiac is +.  -Hold coumadin for now.  -Hemoglobin/Hct was now 7.7/27.8 -C/w Ferrous Sulfate 325 mg po BID -Transfused 2 units of pRBC's -Coumadin on hold for now - Consult GI - Dr. Fuller Plan (Called in)  COPD  -C/w Supplemental O2 -C/w Xopenex/Atrovent q6h  OSA  -Uses Bipap overnight at home  DM2  -On po Medications with Metformin 500 mg TIDwm and Bedtime at Home -Will Hold po Home Meds -Start Sensitive Novolog SSI [facility]  ParoxysmalAtrial Fib/ Atrial Flutter  -Rate controlled,  -Hold coumadin w/ Positive Hemoccult and Anemia - Consult GI  HTN  - Optimize  Hypokalemia - Replete  Acute Renal Insufficiency   - Improving - Avoid Nephrotoxics - Renally dose all medications.  Hypothyroidism -Check TSH and Free T4 -C/w Levothyroxine 175 mcg po Daily   HLD -C/w Pravastatin 40 mg po Daily  Anxiety/Depression -C/w Duloxetine 60 mg po Daily and Diazepam 5 mg q12hprn   DVT prophylaxis: SCDs; Coumadin held for now and consider restarting in AM  Code Status: DO NOT RESUSCITATE  Family Communication: No family at bedside Disposition Plan: Remain in SDU  Consultants:   Cardiology   Procedures:  ECHOCARDIOGRAM ------------------------------------------------------------------- Study Conclusions  - Left ventricle: The cavity size was normal. Wall thickness was   normal. Systolic function was normal. The estimated ejection   fraction was in the range of 55% to 60%. - Mitral valve: Calcified annulus. Mildly thickened leaflets.   There was mild  regurgitation. - Left atrium: The atrium was severely dilated. - Right ventricle: The cavity size was mildly dilated. Systolic   function was mildly to moderately reduced. - Right atrium: The atrium was severely dilated.   Antimicrobials:  Anti-infectives (From admission, onward)   None     Subjective: Seen and examined at bedside and she stated that she was breathing better. Still SOB but no CP. No lightheadedness or dizziness.   Objective: Vitals:   08/17/17 0731 08/17/17 0800 08/17/17 1030 08/17/17 1134  BP: 122/71   116/86  Pulse: 85 81 95 88  Resp: 19 17 16 14   Temp: 97.6 F (36.4 C)   97.7 F (36.5 C)  TempSrc: Axillary   Oral  SpO2: 94% 95% 93% 97%  Weight:      Height:        Intake/Output Summary (Last 24 hours) at 08/17/2017 1242 Last data filed at 08/17/2017 1000 Gross per 24 hour  Intake 100 ml  Output 1775 ml  Net -1675 ml   Filed Weights   08/15/17 0600 08/16/17 0438 08/17/17 0348  Weight: 115.7 kg (255 lb 1.2 oz) 109.6 kg (241 lb 10 oz) 109.9 kg (242 lb 4.6 oz)   Examination: Physical Exam:  Constitutional: WN/WD obese Caucasian female in mild respiratory distress Eyes: Sclerae anicteric. Lids and conjunctivae normal ENMT: External Ears and Nose appear normal. MMM Neck: Appears normal supple. Mild JVD Respiratory: Decreased air entry globally. No wheezing. Cardiovascular: Irregular but rate controlled. 2/6 Systolic Murmur. Has Trace LE edema Abdomen:  Soft, NT, ND. No appreciable hepatosplenomegaly. Bowel sounds present x4. GU: Deferred Musculoskeletal: No contractures or cyanosis Skin: No rashes or lesions on a limited skin evaluation. Warm and dry Neurologic: CN 2-12 grossly intact with no appreciable focal deficits.   Data Reviewed: I have personally reviewed following labs and imaging studies  CBC: Recent Labs  Lab 08/12/17 1729 08/13/17 2206 08/14/17 1423 08/15/17 0249 08/16/17 0303 08/17/17 0257  WBC 6.8 10.0 7.6 6.9 8.2 7.2    NEUTROABS 5,345 8.9*  --  5.3 6.2  --   HGB 7.7* 8.1* 7.8* 7.7* 10.2* 10.0*  HCT 25.8* 30.7* 28.0* 27.8* 35.1* 34.7*  MCV 78.9* 86.2 82.4 81.5 81.3 83.2  PLT 359 422* 307 312 307 161   Basic Metabolic Panel: Recent Labs  Lab 08/13/17 2206 08/14/17 2052 08/15/17 0249 08/16/17 0303 08/17/17 0257  NA 137 137 135 138 136  K 4.3 4.1 3.6 3.0* 3.3*  CL 102 98* 95* 90* 92*  CO2 23 30 29  32 33*  GLUCOSE 165* 113* 102* 96 116*  BUN 22* 28* 29* 27* 32*  CREATININE 1.30* 1.56* 1.49* 1.15* 1.15*  CALCIUM 8.7* 8.8* 8.7* 8.5* 8.4*  MG  --   --  1.7 1.6*  --   PHOS  --   --  5.1* 4.1 3.7   GFR: Estimated Creatinine Clearance: 52.4 mL/min (A) (by C-G formula based on SCr of 1.15 mg/dL (H)). Liver Function Tests: Recent Labs  Lab 08/13/17 2206 08/14/17 2052 08/15/17 0249 08/16/17 0303 08/17/17 0257  AST 20 31 31  34  --  ALT 10* 19 20 26   --   ALKPHOS 95 98 95 104  --   BILITOT 0.2* 0.5 0.5 0.9  --   PROT 6.4* 6.3* 6.4* 6.8  --   ALBUMIN 3.3* 3.3* 3.4* 3.6 3.3*   No results for input(s): LIPASE, AMYLASE in the last 168 hours. No results for input(s): AMMONIA in the last 168 hours. Coagulation Profile: Recent Labs  Lab 08/12/17 1729 08/13/17 2206  INR 2.3* 2.14   Cardiac Enzymes: No results for input(s): CKTOTAL, CKMB, CKMBINDEX, TROPONINI in the last 168 hours. BNP (last 3 results) No results for input(s): PROBNP in the last 8760 hours. HbA1C: No results for input(s): HGBA1C in the last 72 hours. CBG: Recent Labs  Lab 08/16/17 2004 08/17/17 0034 08/17/17 0343 08/17/17 0736 08/17/17 1140  GLUCAP 230* 112* 124* 135* 188*   Lipid Profile: No results for input(s): CHOL, HDL, LDLCALC, TRIG, CHOLHDL, LDLDIRECT in the last 72 hours. Thyroid Function Tests: Recent Labs    08/16/17 0303  TSH 3.887  FREET4 1.07   Anemia Panel: No results for input(s): VITAMINB12, FOLATE, FERRITIN, TIBC, IRON, RETICCTPCT in the last 72 hours. Sepsis Labs: No results for input(s):  PROCALCITON, LATICACIDVEN in the last 168 hours.  Recent Results (from the past 240 hour(s))  MRSA PCR Screening     Status: None   Collection Time: 08/14/17  8:08 AM  Result Value Ref Range Status   MRSA by PCR NEGATIVE NEGATIVE Final    Comment:        The GeneXpert MRSA Assay (FDA approved for NASAL specimens only), is one component of a comprehensive MRSA colonization surveillance program. It is not intended to diagnose MRSA infection nor to guide or monitor treatment for MRSA infections.     Radiology Studies: No results found. Scheduled Meds: . bisoprolol  10 mg Oral Daily  . chlorhexidine  15 mL Mouth Rinse BID  . diltiazem  300 mg Oral Daily  . DULoxetine  60 mg Oral Daily  . famotidine  20 mg Oral BID  . ferrous sulfate  325 mg Oral BID WC  . furosemide  60 mg Intravenous Q8H  . guaiFENesin  1,200 mg Oral BID  . ipratropium  0.5 mg Nebulization Q6H  . levalbuterol  0.63 mg Nebulization Q6H  . levothyroxine  175 mcg Oral QAC breakfast  . mouth rinse  15 mL Mouth Rinse q12n4p  . mometasone-formoterol  2 puff Inhalation BID  . pravastatin  40 mg Oral q1800  . sodium chloride flush  3 mL Intravenous Q12H   Continuous Infusions: . sodium chloride    . acetaminophen 1,000 mg (08/17/17 1207)    LOS: 3 days   Bonnell Public, MD [facility] Pager [phone]  If 7PM-7AM, please contact night-coverage [email or web address] Password [facility] 08/17/2017, 12:42 PM

## 2017-08-17 NOTE — Progress Notes (Signed)
Progress Note  Patient Name: Jillian Wood Date of Encounter: 08/17/2017  Primary Cardiologist: [place] continues to improve Net 5L diuresis since admission. Weight down 13 lb. Approaching assumed baseline dry weight, but BNP still high at >600 (probable baseline <350)   Inpatient Medications    Scheduled Meds: . bisoprolol  10 mg Oral Daily  . chlorhexidine  15 mL Mouth Rinse BID  . diltiazem  300 mg Oral Daily  . DULoxetine  60 mg Oral Daily  . famotidine  20 mg Oral BID  . ferrous sulfate  325 mg Oral BID WC  . furosemide  60 mg Intravenous Q8H  . guaiFENesin  1,200 mg Oral BID  . ipratropium  0.5 mg Nebulization Q6H  . levalbuterol  0.63 mg Nebulization Q6H  . levothyroxine  175 mcg Oral QAC breakfast  . mouth rinse  15 mL Mouth Rinse q12n4p  . mometasone-formoterol  2 puff Inhalation BID  . pravastatin  40 mg Oral q1800  . sodium chloride flush  3 mL Intravenous Q12H   Continuous Infusions: . sodium chloride    . acetaminophen Stopped (08/16/17 2348)  . potassium chloride 10 mEq (08/17/17 0738)   PRN Meds: sodium chloride, acetaminophen, acetaminophen-codeine, diazepam, levalbuterol, ondansetron (ZOFRAN) IV, sodium chloride flush   Vital Signs    Vitals:   08/17/17 0348 08/17/17 0405 08/17/17 0723 08/17/17 0731  BP: 113/79   122/71  Pulse: (!) 47 80  85  Resp: 16 (!) 21  19  Temp: 97.6 F (36.4 C)   97.6 F (36.4 C)  TempSrc: Axillary   Axillary  SpO2: 97% 93% 94% 94%  Weight: 242 lb 4.6 oz (109.9 kg)     Height:        Intake/Output Summary (Last 24 hours) at 08/17/2017 0823 Last data filed at 08/17/2017 0350 Gross per 24 hour  Intake 100 ml  Output 1275 ml  Net -1175 ml   Filed Weights   08/15/17 0600 08/16/17 0438 08/17/17 0348  Weight: 255 lb 1.2 oz (115.7 kg) 241 lb 10 oz (109.6 kg) 242 lb 4.6 oz (109.9 kg)    Telemetry    AFib, rate controlled - Personally Reviewed  ECG    No new tracing - Personally  Reviewed  Physical Exam  Comfortable at [facility] 30 deg GEN: No acute distress.   Neck: No JVD Cardiac: irregular, 2/6 early peaking systolic ejection murmur in the aortic focus, no diastolic murmurs, , rubs, or gallops.  Respiratory: diminished breath sounds throughout , only a few wheezes. GI: Soft, nontender, non-distended  MS: No edema; No deformity. Neuro:  Nonfocal  Psych: Normal affect   Labs    Chemistry Recent Labs  Lab 08/14/17 2052 08/15/17 0249 08/16/17 0303 08/17/17 0257  NA 137 135 138 136  K 4.1 3.6 3.0* 3.3*  CL 98* 95* 90* 92*  CO2 30 29 32 33*  GLUCOSE 113* 102* 96 116*  BUN 28* 29* 27* 32*  CREATININE 1.56* 1.49* 1.15* 1.15*  CALCIUM 8.8* 8.7* 8.5* 8.4*  PROT 6.3* 6.4* 6.8  --   ALBUMIN 3.3* 3.4* 3.6 3.3*  AST 31 31 34  --   ALT 19 20 26   --   ALKPHOS 98 95 104  --   BILITOT 0.5 0.5 0.9  --   GFRNONAA 32* 34* 47* 47*  GFRAA 37* 40* 54* 54*  ANIONGAP 9 11 16* 11     Hematology Recent Labs  Lab 08/15/17 0249 08/16/17  0303 08/17/17 0257  WBC 6.9 8.2 7.2  RBC 3.41* 4.32 4.17  HGB 7.7* 10.2* 10.0*  HCT 27.8* 35.1* 34.7*  MCV 81.5 81.3 83.2  MCH 22.6* 23.6* 24.0*  MCHC 27.7* 29.1* 28.8*  RDW 17.0* 16.5* 16.9*  PLT 312 307 289    Cardiac EnzymesNo results for input(s): TROPONINI in the last 168 hours. No results for input(s): TROPIPOC in the last 168 hours.   BNP Recent Labs  Lab 08/14/17 1423 08/17/17 0257  BNP 554.2* 610.4*     DDimer No results for input(s): DDIMER in the last 168 hours.   Radiology    No results found.  Cardiac Studies   Echo 08/14/2017 - Left ventricle: The cavity size was normal. Wall thickness was normal. Systolic function was normal. The estimated ejection fraction was in the range of 55% to 60%. - Mitral valve: Calcified annulus. Mildly thickened leaflets . There was mild regurgitation. - Left atrium: The atrium was severely dilated. - Right ventricle: The cavity size was mildly dilated.  Systolic function was mildly to moderately reduced. - Right atrium: The atrium was severely dilated.  Patient Profile     71 y.o. female with acute on chronic diastolic heart failure in the setting of permanent atrial fibrillation, moderate to severe anemia, severe oxygen dependent COPD, noncompliance with dietary sodium restriction, following her husband's demise.  Assessment & Plan    1. CHF: Approaching estimated "dry weight" , around 240 pounds on her home scale. Very difficult to assess volume status due to obesity and concomitant lung disease. BNP still high, will shoot for a target BNP of 350 or less. Creatinine still essentially unchanged. One more day of IV diuretics today, potentially switch to oral diuretics tomorrow. 2. COPD: Severe, oxygen dependent.  The goal will be to get her off high flow oxygen. 3. Anemia: stable after transfusion. 4.  AFib: Appropriate rate control.  Anticoagulation currently on hold. Hard to assess risk-benefit of anticoagulation without knowing the cause of her anemia. GI planning endoscopy prior to DC, but after improvement in respiratory status.  For questions or updates, please contact [place] Please consult [email or web address] for contact info under Cardiology/STEMI.      Signed, Sanda Klein, MD  08/17/2017, 8:23 AM

## 2017-08-17 NOTE — Progress Notes (Signed)
Subjective:    Patient ID: Jillian Wood, female    DOB: March 19, 1946, 71 y.o.   MRN: [id number]  HPI   This nice 71 yo WWF with HTN, HLD, ASHD/cAfib, End Stage O2 dependent COPD w/OSA/CPAP overlap, Morbid Obesity, T2_DM, Hypothyroidism , GERD and Vit D Def presents for her periodic coag monitoring . She denies any issues with bleeding/bruising or noted blood in urine or stools.  Patient is wheel chair dependent and reports her O2 sat's on 7-9 lpm range about 85%. She is a CO2 retainer and on Diamox.   She has ongoing c/o dyspnea. Denies CP,or PND/Orthopnea.   Medication Sig  . Acetaminophen (TYLENOL 8 HOUR ARTHRITIS PAIN PO) Take 1 tablet by mouth. PRN  . acetaminophen-codeine (TYLENOL #3) 300-30 MG tablet Take 1 tablet by mouth every 8 (eight) hours as needed for moderate pain or severe pain.  Marland Kitchen acetaZOLAMIDE (DIAMOX) 250 MG tablet TAKE 1 TABLET (250 MG TOTAL) BY MOUTH 3 (THREE) TIMES DAILY.  Marland Kitchen albuterol (PROVENTIL HFA;VENTOLIN HFA) 108 (90 Base) MCG/ACT inhaler Inhale 1-2 puffs into the lungs every 6 (six) hours as needed for wheezing or shortness of breath.  Marland Kitchen aspirin 81 MG tablet Take 81 mg by mouth daily.  . bisoprolol (ZEBETA) 10 MG tablet TAKE 1 TABLET (10 MG TOTAL) BY MOUTH DAILY.  . budesonide-formoterol (SYMBICORT) 160-4.5 MCG/ACT inhaler Inhale 2 puffs into the lungs 2 (two) times daily.  . Cholecalciferol (VITAMIN D PO) Take 2,000 Units by mouth daily.   . diazepam (VALIUM) 5 MG tablet Take one tablet up to 2 x a daily for anxiety AS NEEDED  . diltiazem (CARDIZEM CD) 300 MG 24 hr capsule TAKE 1 CAPSULE (300 MG TOTAL) BY MOUTH DAILY.  . DULoxetine (CYMBALTA) 60 MG capsule TAKE 1 CAPSULE BY MOUTH DAILY (Patient taking differently: TAKE 60 mg CAPSULE BY MOUTH DAILY)  . famotidine (PEPCID) 20 MG tablet TAKE 1 TABLET (20 MG TOTAL) BY MOUTH 2 (TWO) TIMES DAILY. FOR ACID REFLUX  . ferrous sulfate 325 (65 FE) MG tablet Take 325 mg by mouth 2 (two) times daily with a meal.  . fluticasone  (FLONASE) 50 MCG/ACT nasal spray Place 2 sprays into both nostrils daily. (Patient taking differently: Place 2 sprays into both nostrils daily as needed for allergies. )  . guaiFENesin (MUCINEX) 600 MG 12 hr tablet Take 600 mg by mouth 2 (two) times daily.   Marland Kitchen levothyroxine (SYNTHROID, LEVOTHROID) 175 MCG tablet TAKE 1 TABLET (175 MCG TOTAL) BY MOUTH DAILY BEFORE BREAKFAST.  Marland Kitchen loratadine-pseudoephedrine (CLARITIN-D 24-HOUR) 10-240 MG per 24 hr tablet Take 1 tablet by mouth as needed for allergies.   Marland Kitchen losartan (COZAAR) 50 MG tablet TAKE 1 TABLET BY MOUTH DAILY (Patient taking differently: TAKE 50 mg TABLET BY MOUTH DAILY)  . Magnesium Chloride (MAGNESIUM DR PO) Take 1 capsule by mouth daily.   . metFORMIN (GLUCOPHAGE-XR) 500 MG 24 hr tablet TAKE 1 TABLET (500 MG TOTAL) BY MOUTH 4 (FOUR) TIMES DAILY - AFTER MEALS AND AT BEDTIME.  Marland Kitchen OXYGEN-HELIUM IN Inhale 5-6 L into the lungs continuous.   . pravastatin (PRAVACHOL) 40 MG tablet TAKE 1 TABLET (40 MG TOTAL) BY MOUTH DAILY.  Marland Kitchen warfarin (COUMADIN) 5 MG tablet Take 1-1.5 tablets (5-7.5 mg total) by mouth daily at 6 PM. TAKES 7.5MG  ON TUES, THURS, SUNDAY TAKES 5MG  ALL OTHER DAYS  . furosemide (LASIX) 80 MG tablet TAKE 1 TABLET (80 MG TOTAL) BY MOUTH DAILY.  . budesonide (PULMICORT) 0.5 MG/2ML nebulizer solution Take 2 mLs (  0.5 mg total) by nebulization 2 (two) times daily. (Patient not taking: Reported on 08/12/2017)  . ipratropium-albuterol (DUONEB) 0.5-2.5 (3) MG/3ML SOLN Inhale 3 ml by Nebulizer 4 x / day (Patient not taking: Reported on 08/12/2017)   Allergies  Allergen Reactions  . Inderal [Propranolol] Other (See Comments)    Hair loss  . Ace Inhibitors Cough   Past Medical History:  Diagnosis Date  . Arthritis   . CHF (congestive heart failure) ([place])   . COPD (chronic obstructive pulmonary disease) ([place])   . Depression   . Diabetes mellitus type 2 in obese ([place])   . GERD (gastroesophageal reflux disease)   . Hyperlipidemia   .  Hypertension   . Morbid obesity ([place])   . OAB (overactive bladder)   . PSVT (paroxysmal supraventricular tachycardia) ([place])   . Thyroid disease   . Vitamin D deficiency    Review of Systems  10 point systems review negative except as above.    Objective:   Physical Exam  BP 112/64   Pulse 60   Temp 97.9 F (36.6 C)   Resp 20   Ht 5\' 2"  (1.575 m)   Wt 253 lb (114.8 kg)   BMI 46.27 kg/m   Morbidly Obese. On Nasal O2.   HEENT - WNL. Neck - supple.  Chest - BS very distant due to chest wall thickness.  Cor - Nl HS. Sl ir RR w/o sig m.  1-2 (+)edema. MS- FROM w/o deformities.  In W/C Neuro -  Nl w/o focal abnormalities.    Assessment & Plan:   1. Essential hypertension  - CBC with Differential/Platelet - BASIC METABOLIC PANEL WITH GFR  2. Hyperlipidemia, mixed   3. Type 2 diabetes mellitus with stage 3 chronic kidney disease, without long-term current use of insulin (HCC)  - BASIC METABOLIC PANEL WITH GFR  4. Vitamin D deficiency   5. Paroxysmal A-fib (HCC)  - Protime-INR  6. Acute on chronic respiratory failure with hypoxia (HCC)   7. Current use of long term anticoagulation  - Protime-INR  8. Medication management  - CBC with Differential/Platelet - BASIC METABOLIC PANEL WITH GFR  9. Need for immunization against influenza  - Flu vaccine HIGH DOSE PF (Fluzone High dose)

## 2017-08-18 DIAGNOSIS — K922 Gastrointestinal hemorrhage, unspecified: Secondary | ICD-10-CM

## 2017-08-18 DIAGNOSIS — Z86010 Personal history of colonic polyps: Secondary | ICD-10-CM

## 2017-08-18 LAB — GLUCOSE, CAPILLARY
GLUCOSE-CAPILLARY: 145 mg/dL — AB (ref 65–99)
Glucose-Capillary: 119 mg/dL — ABNORMAL HIGH (ref 65–99)
Glucose-Capillary: 119 mg/dL — ABNORMAL HIGH (ref 65–99)
Glucose-Capillary: 107 mg/dL — ABNORMAL HIGH (ref 65–99)
Glucose-Capillary: 111 mg/dL — ABNORMAL HIGH (ref 65–99)

## 2017-08-18 LAB — CBC
HCT: 35.9 % — ABNORMAL LOW (ref 36.0–46.0)
Hemoglobin: 10.2 g/dL — ABNORMAL LOW (ref 12.0–15.0)
MCH: 23.7 pg — ABNORMAL LOW (ref 26.0–34.0)
MCHC: 28.4 g/dL — ABNORMAL LOW (ref 30.0–36.0)
MCV: 83.5 fL (ref 78.0–100.0)
Platelets: 270 10*3/uL (ref 150–400)
RBC: 4.3 MIL/uL (ref 3.87–5.11)
RDW: 17.1 % — ABNORMAL HIGH (ref 11.5–15.5)
WBC: 6.3 10*3/uL (ref 4.0–10.5)

## 2017-08-18 LAB — RENAL FUNCTION PANEL
Albumin: 3.4 g/dL — ABNORMAL LOW (ref 3.5–5.0)
Anion gap: 12 (ref 5–15)
BUN: 30 mg/dL — ABNORMAL HIGH (ref 6–20)
CO2: 35 mmol/L — ABNORMAL HIGH (ref 22–32)
Calcium: 8.8 mg/dL — ABNORMAL LOW (ref 8.9–10.3)
Chloride: 92 mmol/L — ABNORMAL LOW (ref 101–111)
Creatinine, Ser: 1.07 mg/dL — ABNORMAL HIGH (ref 0.44–1.00)
GFR calc Af Amer: 59 mL/min — ABNORMAL LOW (ref >60)
GFR calc non Af Amer: 51 mL/min — ABNORMAL LOW (ref >60)
Glucose, Bld: 122 mg/dL — ABNORMAL HIGH (ref 65–99)
Phosphorus: 3.5 mg/dL (ref 2.5–4.6)
Potassium: 3.7 mmol/L (ref 3.5–5.1)
Sodium: 139 mmol/L (ref 135–145)

## 2017-08-18 LAB — PROTIME-INR
INR: 1.19
PROTHROMBIN TIME: 15 s (ref 11.4–15.2)

## 2017-08-18 MED ORDER — PEG-KCL-NACL-NASULF-NA ASC-C 100 G PO SOLR
0.5000 | Freq: Once | ORAL | Status: AC
  Administered 2017-08-18: 100 g via ORAL
  Filled 2017-08-18 (×2): qty 1

## 2017-08-18 MED ORDER — POTASSIUM CHLORIDE CRYS ER 20 MEQ PO TBCR
40.0000 meq | EXTENDED_RELEASE_TABLET | Freq: Once | ORAL | Status: AC
  Administered 2017-08-18: 40 meq via ORAL
  Filled 2017-08-18: qty 2

## 2017-08-18 MED ORDER — BISACODYL 5 MG PO TBEC
10.0000 mg | DELAYED_RELEASE_TABLET | Freq: Once | ORAL | Status: AC
  Administered 2017-08-18: 10 mg via ORAL
  Filled 2017-08-18: qty 2

## 2017-08-18 MED ORDER — POLYETHYLENE GLYCOL 3350 17 G PO PACK
17.0000 g | PACK | Freq: Two times a day (BID) | ORAL | Status: DC
  Administered 2017-08-18 – 2017-08-20 (×2): 17 g via ORAL
  Filled 2017-08-18 (×4): qty 1

## 2017-08-18 MED ORDER — METOCLOPRAMIDE HCL 5 MG/ML IJ SOLN
10.0000 mg | Freq: Once | INTRAMUSCULAR | Status: AC
  Administered 2017-08-19: 10 mg via INTRAVENOUS
  Filled 2017-08-18: qty 2

## 2017-08-18 MED ORDER — PEG-KCL-NACL-NASULF-NA ASC-C 100 G PO SOLR: 1.0000 | Freq: Once | ORAL | Status: DC

## 2017-08-18 MED ORDER — METOCLOPRAMIDE HCL 5 MG/ML IJ SOLN
10.0000 mg | Freq: Once | INTRAMUSCULAR | Status: AC
  Administered 2017-08-18: 10 mg via INTRAVENOUS
  Filled 2017-08-18: qty 2

## 2017-08-18 MED ORDER — FUROSEMIDE 80 MG PO TABS
80.0000 mg | ORAL_TABLET | Freq: Every day | ORAL | Status: DC
  Administered 2017-08-18 – 2017-08-20 (×3): 80 mg via ORAL
  Filled 2017-08-18 (×3): qty 1

## 2017-08-18 MED ORDER — PEG-KCL-NACL-NASULF-NA ASC-C 100 G PO SOLR
0.5000 | Freq: Once | ORAL | Status: AC
  Administered 2017-08-19: 100 g via ORAL
  Filled 2017-08-18: qty 1

## 2017-08-18 NOTE — Progress Notes (Signed)
Titrated to 10L, tolerating well RCP wo continue to monitor.

## 2017-08-18 NOTE — Consult Note (Signed)
   [facility] CM Inpatient Consult   08/18/2017  BRADI ARBUTHNOT 1946/06/16 [id number]  Referral received to assess for care management services.    Met with the patient's son, [name], at the door who states, "my mom is using the bathroom and she's not in a very good mood at this time. I take care of her and my brother.  My dad died in 04-16-2023 and all of this is on me and [facility] has not been able to help me. Ma'am there's nothing you all can do for [place] unless you can get the lab her doctor uses to approve it as in network.I am still trying to get her oxygen covered from home in which that had been declined."  Explain the visit regarding the benefits of [facility] services with her [employer] plan for resources, education, follow up and navigation.  Explained that [place] [facility] is a covered benefit of insurance.  He states, "Look I will look it over and see what this is saying but I know that transportation is too far out for anyone to help with that.  I have tried everything I can."  Review information for [facility] and a folder was provided with contact information.  Explained that [facility] does not interfere with or replace any services arranged by the inpatient care management staff. [name], accepted the brochure, 24 hour nurse advise line and contact information.  Patient and son declined services with [facility] at this time.  This Probation officer did follow up with inpatient RNCM about findings. She states patient is still on 5 L of oxygen at this time and her home oxygen needs will be re-assessed.   Natividad Brood, RN BSN [facility] Liaison  [phone] business mobile phone Toll free office [phone]

## 2017-08-18 NOTE — Progress Notes (Signed)
Physical Therapy Discharge Patient Details Name: Jillian Wood MRN: [id number] DOB: [birth year] Today's Date: 08/18/2017 Time:  -     Patient discharged from PT services secondary to pt and son request. Pt reports that she just wants to be left alone and does not want therapy to come back. Son reports that she transfers only at baseline and can currently do that and he will be helping her and can handle her current LOC.   Please see latest therapy progress note for current level of functioning and progress toward goals.    Progress and discharge plan discussed with patient and/or caregiver and they agree. PT signing off.       [place] 08/18/2017, 1:21 PM

## 2017-08-18 NOTE — Progress Notes (Signed)
Pt wearing bipap tonight. No issues noted and pt in NAD. Treatments were placed in aerogen on E78 and no complications

## 2017-08-18 NOTE — Progress Notes (Signed)
[facility] Progress Note    Since last GI note: She has been diuresing well, breathing much easier overall. Still on [place] oxygen but she is always on oxygen (6-8liters at home).  She does not ambulate well due to OA pains.  Eating well.  No overt GI bleeding.  Objective: Vital signs in last 24 hours: Temp:  [97.3 F (36.3 C)-98.7 F (37.1 C)] 97.3 F (36.3 C) (12/03 0742) Pulse Rate:  [62-95] 84 (12/03 0742) Resp:  [14-21] 20 (12/03 0742) BP: (92-118)/(64-86) 116/72 (12/03 0742) SpO2:  [92 %-100 %] 99 % (12/03 0910) FiO2 (%):  [70 %] 70 % (12/03 0352) Weight:  [242 lb (109.8 kg)] 242 lb (109.8 kg) (12/03 0352) Last BM Date: 08/13/17 General: alert and oriented times 3 Heart: regular rate and rythm Abdomen: soft, non-tender, non-distended, normal bowel sounds   Lab Results: Recent Labs    08/16/17 0303 08/17/17 0257 08/18/17 0159  WBC 8.2 7.2 6.3  HGB 10.2* 10.0* 10.2*  PLT 307 289 270  MCV 81.3 83.2 83.5   Recent Labs    08/16/17 0303 08/17/17 0257 08/18/17 0159  NA 138 136 139  K 3.0* 3.3* 3.7  CL 90* 92* 92*  CO2 32 33* 35*  GLUCOSE 96 116* 122*  BUN 27* 32* 30*  CREATININE 1.15* 1.15* 1.07*  CALCIUM 8.5* 8.4* 8.8*   Recent Labs    08/16/17 0303 08/17/17 0257 08/18/17 0159  PROT 6.8  --   --   ALBUMIN 3.6 3.3* 3.4*  AST 34  --   --   ALT 26  --   --   ALKPHOS 104  --   --   BILITOT 0.9  --   --     Medications: Scheduled Meds: . bisoprolol  10 mg Oral Daily  . chlorhexidine  15 mL Mouth Rinse BID  . diltiazem  300 mg Oral Daily  . DULoxetine  60 mg Oral Daily  . famotidine  20 mg Oral BID  . ferrous sulfate  325 mg Oral BID WC  . furosemide  80 mg Oral Daily  . guaiFENesin  1,200 mg Oral BID  . ipratropium  0.5 mg Nebulization Q6H  . levalbuterol  0.63 mg Nebulization Q6H  . levothyroxine  175 mcg Oral QAC breakfast  . mouth rinse  15 mL Mouth Rinse q12n4p  . mometasone-formoterol  2 puff Inhalation BID  . pravastatin  40 mg  Oral q1800  . sodium chloride flush  3 mL Intravenous Q12H   Continuous Infusions: . sodium chloride     PRN Meds:.sodium chloride, acetaminophen, acetaminophen-codeine, diazepam, levalbuterol, ondansetron (ZOFRAN) IV, sodium chloride flush    Assessment/Plan: 71 y.o. female heme + anemia, history of precancerous colon polyps.  Her CHF, fluid overload is much improved in past 2-3 days and I think she is OK to proceed with the recommended GI testing for the above.  She's having no overt bleeding.  Will check INR today and if adequate will plan for colonoscopy and EGD tomorrow    Milus Banister, MD  08/18/2017, 9:50 AM [facility] Pager [phone]

## 2017-08-18 NOTE — Progress Notes (Signed)
PROGRESS NOTE  Jillian Wood OVZ:[id number] DOB: April 24, 1946 DOA: 08/13/2017 PCP: Jillian Pinto, MD  Brief Narrative: 71yow with acute hypoxia via EMS, reported DOE, orthopnea, leg edema. Admitted for acute CHF, acute on chronic hypoxia, CHF  Assessment/Plan Acute on chronic hypoxic resp failure. Treated with BiPAP. - improving. Currently on [place]. Continue treatment as below.  Acute on chronic diastolic CHF - Minus 7L since admission. Now on oral Lasix per cardiology. Would take an additional 40 mg for weight gain of 2-3 pounds.  The patient needs low-sodium diet and fluid restriction to 1.5 L daily  AKI. Resolving. Hold ARB. - no further evaluation suggested  Normocytic anemia - stable s/p PRBC 2 units 11/30 - GI rec PPI, hold warfarin; colonoscopy/EGD 12/4.  Afib/flutter. CHA2DS2-VASc 5 - resume warfarin when anemia stable - continue bisoprolol and cardizem.  Endstage COPD, oxygen dependent 5-6 L [name] at baseline and 10 L with exertion.  DM type 2 - stable.   OSA/CPAP - BiPAP QHS and PRN  Morbid obesity - per dietician  DVT prophylaxis: SCDs Code Status: DNR Family Communication: none Disposition Plan: home    Jillian Hodgkins, MD  [facility] Direct contact: [phone] --[place]ia [place]  --[email or web address]; password [email or web address]  7PM-7AM contact night coverage as above 08/18/2017, 11:11 AM  LOS: 4 days   Consultants:  Cardiology  GI  Procedures:  11/29 Echo Study Conclusions  - Left ventricle: The cavity size was normal. Wall thickness was   normal. Systolic function was normal. The estimated ejection   fraction was in the range of 55% to 60%. - Mitral valve: Calcified annulus. Mildly thickened leaflets .   There was mild regurgitation. - Left atrium: The atrium was severely dilated. - Right ventricle: The cavity size was mildly dilated. Systolic   function was mildly to moderately reduced. - Right atrium: The atrium was severely dilated.  2  units PRBC 11/30  Antimicrobials:    Interval history/Subjective: Feeling better, breathing better.  Objective: Vitals:  Vitals:   08/18/17 0742 08/18/17 0910  BP: 116/72   Pulse: 84   Resp: 20   Temp: (!) 97.3 F (36.3 C)   SpO2: 100% 99%    Exam:  Constitutional:  . Appears calm and comfortable sitting on side of bed Respiratory:  . CTA bilaterally, no w/r/r.  . Respiratory effort normal. Cardiovascular:  . RRR, no m/r/g . No LE extremity edema   Psychiatric:  . Mental status o Mood, affect appropriate  I have personally reviewed the following:  Filed Weights   08/16/17 0438 08/17/17 0348 08/18/17 0352  Weight: 109.6 kg (241 lb 10 oz) 109.9 kg (242 lb 4.6 oz) 109.8 kg (242 lb)   Weight change: -0.13 kg (-4.6 oz)  UOP: -3200 mL I/O since admission: -7.4 L Last BM charted: unrecorded Telemetry: afib Status: SDU  Labs:  CBG stable  BMP unremarkable  Hgb stable 10.2   Scheduled Meds: . bisoprolol  10 mg Oral Daily  . chlorhexidine  15 mL Mouth Rinse BID  . diltiazem  300 mg Oral Daily  . DULoxetine  60 mg Oral Daily  . famotidine  20 mg Oral BID  . ferrous sulfate  325 mg Oral BID WC  . furosemide  80 mg Oral Daily  . guaiFENesin  1,200 mg Oral BID  . ipratropium  0.5 mg Nebulization Q6H  . levalbuterol  0.63 mg Nebulization Q6H  . levothyroxine  175 mcg Oral QAC breakfast  . mouth rinse  15 mL Mouth  Rinse q12n4p  . mometasone-formoterol  2 puff Inhalation BID  . pravastatin  40 mg Oral q1800  . sodium chloride flush  3 mL Intravenous Q12H   Continuous Infusions: . sodium chloride      Principal Problem:   Acute on chronic diastolic CHF (congestive heart failure) (HCC) Active Problems:   Essential hypertension   Morbid obesity (HCC)   Acute on chronic respiratory failure with hypoxia (HCC)   Diabetes mellitus type 2 in obese (HCC)   OSA (obstructive sleep apnea)   Anemia   Acute renal insufficiency   DNR (do not resuscitate)    Dyspnea   Acute on chronic diastolic (congestive) heart failure ([place])   Blood in stool   LOS: 4 days

## 2017-08-18 NOTE — Care Management Note (Addendum)
Case Management Note  Patient Details  Name: Jillian Wood MRN: [id number] Date of Birth: [birth year]  Subjective/Objective:  Pt presented for CHF. Initially from home with son- Jillian Wood that is her caregiver 24/7. Pt has DME: WC, Cane, RW and home oxygen with [employer] (6 to 8) liters-depending on activity. Pt is now on 02 via 12 Liters [place].  Pt has PCP-[facility] and pt is able to afford medication coverage. CM did speak with pt in regards to [facility] RN and [place] is declining services 2/2 to co-pay for each discipline. Pt lives in [place].    Action/Plan: Pt's son will be able to bring a portable tank to hospital once stable for d/c. No further needs from CM at this time. [facility] to follow the patient while in the Hospital.   Expected Discharge Date:                  Expected Discharge Plan:  [place]  In-House Referral:  NA  Discharge planning Services  CM Consult  Post Acute Care Choice:  NA Choice offered to:  NA  DME Arranged:  N/A DME Agency:  NA  HH Arranged:  NA HH Agency:  NA  Status of Service:  Completed, signed off  If discussed at [place] of Stay Meetings, dates discussed:    Additional Comments:  Bethena Roys, RN 08/18/2017, 11:38 AM

## 2017-08-18 NOTE — Care Management Important Message (Signed)
Important Message  Patient Details  Name: Jillian Wood MRN: [id number] Date of Birth: 16-Dec-1945   Medicare Important Message Given:  Yes    Nathen May 08/18/2017, 10:57 AM

## 2017-08-18 NOTE — Progress Notes (Signed)
Bipap removed at 0805 per pt request.  Pt fully awake and alert.  No difficulty breathing.  Idolina Primer, RN

## 2017-08-18 NOTE — Progress Notes (Signed)
Progress Note  Patient Name: Jillian Wood Date of Encounter: 08/18/2017  Primary Cardiologist: Dr Gwenlyn Found  Subjective   Weak; no chest pain or dyspnea   Inpatient Medications    Scheduled Meds: . bisoprolol  10 mg Oral Daily  . chlorhexidine  15 mL Mouth Rinse BID  . diltiazem  300 mg Oral Daily  . DULoxetine  60 mg Oral Daily  . famotidine  20 mg Oral BID  . ferrous sulfate  325 mg Oral BID WC  . furosemide  60 mg Intravenous Q8H  . guaiFENesin  1,200 mg Oral BID  . ipratropium  0.5 mg Nebulization Q6H  . levalbuterol  0.63 mg Nebulization Q6H  . levothyroxine  175 mcg Oral QAC breakfast  . mouth rinse  15 mL Mouth Rinse q12n4p  . mometasone-formoterol  2 puff Inhalation BID  . pravastatin  40 mg Oral q1800  . sodium chloride flush  3 mL Intravenous Q12H   Continuous Infusions: . sodium chloride     PRN Meds: sodium chloride, acetaminophen, acetaminophen-codeine, diazepam, levalbuterol, ondansetron (ZOFRAN) IV, sodium chloride flush   Vital Signs    Vitals:   08/17/17 2358 08/18/17 0154 08/18/17 0352 08/18/17 0742  BP: 110/69  118/75 116/72  Pulse: 90  78 84  Resp: 20  17 20   Temp: 97.8 F (36.6 C)  97.6 F (36.4 C) (!) 97.3 F (36.3 C)  TempSrc: Axillary  Axillary Axillary  SpO2: 98% 96% 99% 100%  Weight:   242 lb (109.8 kg)   Height:        Intake/Output Summary (Last 24 hours) at 08/18/2017 0838 Last data filed at 08/18/2017 0357 Gross per 24 hour  Intake 240 ml  Output 3200 ml  Net -2960 ml   Filed Weights   08/16/17 0438 08/17/17 0348 08/18/17 0352  Weight: 241 lb 10 oz (109.6 kg) 242 lb 4.6 oz (109.9 kg) 242 lb (109.8 kg)    Telemetry    Atrial flutter, rate controlled - Personally Reviewed  Physical Exam   GEN: obese NAD Neck: Supple Cardiac: irregular Respiratory: diminished BS throughout GI: Soft, NT, ND MS: No edema Neuro:  Grossly intact   Labs    Chemistry Recent Labs  Lab 08/14/17 2052 08/15/17 0249 08/16/17 0303  08/17/17 0257 08/18/17 0159  NA 137 135 138 136 139  K 4.1 3.6 3.0* 3.3* 3.7  CL 98* 95* 90* 92* 92*  CO2 30 29 32 33* 35*  GLUCOSE 113* 102* 96 116* 122*  BUN 28* 29* 27* 32* 30*  CREATININE 1.56* 1.49* 1.15* 1.15* 1.07*  CALCIUM 8.8* 8.7* 8.5* 8.4* 8.8*  PROT 6.3* 6.4* 6.8  --   --   ALBUMIN 3.3* 3.4* 3.6 3.3* 3.4*  AST 31 31 34  --   --   ALT 19 20 26   --   --   ALKPHOS 98 95 104  --   --   BILITOT 0.5 0.5 0.9  --   --   GFRNONAA 32* 34* 47* 47* 51*  GFRAA 37* 40* 54* 54* 59*  ANIONGAP 9 11 16* 11 12     Hematology Recent Labs  Lab 08/16/17 0303 08/17/17 0257 08/18/17 0159  WBC 8.2 7.2 6.3  RBC 4.32 4.17 4.30  HGB 10.2* 10.0* 10.2*  HCT 35.1* 34.7* 35.9*  MCV 81.3 83.2 83.5  MCH 23.6* 24.0* 23.7*  MCHC 29.1* 28.8* 28.4*  RDW 16.5* 16.9* 17.1*  PLT 307 289 270    BNP Recent Labs  Lab 08/14/17 1423 08/17/17 0257  BNP 554.2* 610.4*      Cardiac Studies   Echo 08/14/2017 - Left ventricle: The cavity size was normal. Wall thickness was normal. Systolic function was normal. The estimated ejection fraction was in the range of 55% to 60%. - Mitral valve: Calcified annulus. Mildly thickened leaflets . There was mild regurgitation. - Left atrium: The atrium was severely dilated. - Right ventricle: The cavity size was mildly dilated. Systolic function was mildly to moderately reduced. - Right atrium: The atrium was severely dilated.  Patient Profile     71 y.o. female with acute on chronic diastolic heart failure in the setting of permanent atrial fibrillation, moderate to severe anemia, severe oxygen dependent COPD, noncompliance with dietary sodium restriction, following her husband's demise.  Assessment & Plan    1. CHF: Weight 242; I/O -7445 since admission. Back to baseline per patient.  Will discontinue IV Lasix.  Resume home dose of oral Lasix at 80 mg daily.  Follow renal function.  Would take an additional 40 mg for weight gain of 2-3  pounds.  The patient needs low-sodium diet and fluid restriction to 1.5 L daily.   2. COPD: On home O2. 3. Anemia: Hgb unchanged; for endoscopy per GI. 4.  AFib: continue bisoprolol and cardizem for rate control.  Coumadin currently on hold. Would resume following GI evaluation when ok with GI.  We will sign off; please call with questions.  For questions or updates, please contact [place] Please consult [email or web address] for contact info under Cardiology/STEMI.      Signed, Kirk Ruths, MD  08/18/2017, 8:38 AM

## 2017-08-18 NOTE — H&P (View-Only) (Signed)
[facility] Progress Note    Since last GI note: She has been diuresing well, breathing much easier overall. Still on [place] oxygen but she is always on oxygen (6-8liters at home).  She does not ambulate well due to OA pains.  Eating well.  No overt GI bleeding.  Objective: Vital signs in last 24 hours: Temp:  [97.3 F (36.3 C)-98.7 F (37.1 C)] 97.3 F (36.3 C) (12/03 0742) Pulse Rate:  [62-95] 84 (12/03 0742) Resp:  [14-21] 20 (12/03 0742) BP: (92-118)/(64-86) 116/72 (12/03 0742) SpO2:  [92 %-100 %] 99 % (12/03 0910) FiO2 (%):  [70 %] 70 % (12/03 0352) Weight:  [242 lb (109.8 kg)] 242 lb (109.8 kg) (12/03 0352) Last BM Date: 08/13/17 General: alert and oriented times 3 Heart: regular rate and rythm Abdomen: soft, non-tender, non-distended, normal bowel sounds   Lab Results: Recent Labs    08/16/17 0303 08/17/17 0257 08/18/17 0159  WBC 8.2 7.2 6.3  HGB 10.2* 10.0* 10.2*  PLT 307 289 270  MCV 81.3 83.2 83.5   Recent Labs    08/16/17 0303 08/17/17 0257 08/18/17 0159  NA 138 136 139  K 3.0* 3.3* 3.7  CL 90* 92* 92*  CO2 32 33* 35*  GLUCOSE 96 116* 122*  BUN 27* 32* 30*  CREATININE 1.15* 1.15* 1.07*  CALCIUM 8.5* 8.4* 8.8*   Recent Labs    08/16/17 0303 08/17/17 0257 08/18/17 0159  PROT 6.8  --   --   ALBUMIN 3.6 3.3* 3.4*  AST 34  --   --   ALT 26  --   --   ALKPHOS 104  --   --   BILITOT 0.9  --   --     Medications: Scheduled Meds: . bisoprolol  10 mg Oral Daily  . chlorhexidine  15 mL Mouth Rinse BID  . diltiazem  300 mg Oral Daily  . DULoxetine  60 mg Oral Daily  . famotidine  20 mg Oral BID  . ferrous sulfate  325 mg Oral BID WC  . furosemide  80 mg Oral Daily  . guaiFENesin  1,200 mg Oral BID  . ipratropium  0.5 mg Nebulization Q6H  . levalbuterol  0.63 mg Nebulization Q6H  . levothyroxine  175 mcg Oral QAC breakfast  . mouth rinse  15 mL Mouth Rinse q12n4p  . mometasone-formoterol  2 puff Inhalation BID  . pravastatin  40 mg  Oral q1800  . sodium chloride flush  3 mL Intravenous Q12H   Continuous Infusions: . sodium chloride     PRN Meds:.sodium chloride, acetaminophen, acetaminophen-codeine, diazepam, levalbuterol, ondansetron (ZOFRAN) IV, sodium chloride flush    Assessment/Plan: 71 y.o. female heme + anemia, history of precancerous colon polyps.  Her CHF, fluid overload is much improved in past 2-3 days and I think she is OK to proceed with the recommended GI testing for the above.  She's having no overt bleeding.  Will check INR today and if adequate will plan for colonoscopy and EGD tomorrow    Milus Banister, MD  08/18/2017, 9:50 AM [facility] Pager [phone]

## 2017-08-19 ENCOUNTER — Encounter (HOSPITAL_COMMUNITY): Payer: Self-pay | Admitting: *Deleted

## 2017-08-19 ENCOUNTER — Encounter (HOSPITAL_COMMUNITY)
Admission: EM | Disposition: A | Discharge status: Home / Self Care | Payer: Self-pay | Source: Home / Self Care | Attending: Family Medicine

## 2017-08-19 ENCOUNTER — Inpatient Hospital Stay (HOSPITAL_COMMUNITY): Payer: PPO | Admitting: Anesthesiology

## 2017-08-19 HISTORY — PX: COLONOSCOPY: SHX5424

## 2017-08-19 LAB — RENAL FUNCTION PANEL
Albumin: 3.3 g/dL — ABNORMAL LOW (ref 3.5–5.0)
Anion gap: 10 (ref 5–15)
BUN: 25 mg/dL — ABNORMAL HIGH (ref 6–20)
CO2: 41 mmol/L — ABNORMAL HIGH (ref 22–32)
Calcium: 9.1 mg/dL (ref 8.9–10.3)
Chloride: 89 mmol/L — ABNORMAL LOW (ref 101–111)
Creatinine, Ser: 1 mg/dL (ref 0.44–1.00)
GFR calc Af Amer: >60 mL/min (ref >60)
GFR calc non Af Amer: 55 mL/min — ABNORMAL LOW (ref >60)
Glucose, Bld: 127 mg/dL — ABNORMAL HIGH (ref 65–99)
Phosphorus: 3.6 mg/dL (ref 2.5–4.6)
Potassium: 4.1 mmol/L (ref 3.5–5.1)
Sodium: 140 mmol/L (ref 135–145)

## 2017-08-19 LAB — GLUCOSE, CAPILLARY
GLUCOSE-CAPILLARY: 120 mg/dL — AB (ref 65–99)
GLUCOSE-CAPILLARY: 127 mg/dL — AB (ref 65–99)
Glucose-Capillary: 125 mg/dL — ABNORMAL HIGH (ref 65–99)
Glucose-Capillary: 138 mg/dL — ABNORMAL HIGH (ref 65–99)
Glucose-Capillary: 212 mg/dL — ABNORMAL HIGH (ref 65–99)
Glucose-Capillary: 87 mg/dL (ref 65–99)

## 2017-08-19 SURGERY — COLONOSCOPY
Anesthesia: Monitor Anesthesia Care

## 2017-08-19 MED ORDER — WARFARIN - PHARMACIST DOSING INPATIENT
Freq: Every day | Status: DC
  Administered 2017-08-19: 17:00:00

## 2017-08-19 MED ORDER — KETAMINE HCL 10 MG/ML IJ SOLN
INTRAMUSCULAR | Status: DC | PRN
  Administered 2017-08-19: 30 mg via INTRAVENOUS
  Administered 2017-08-19: 10 mg via INTRAVENOUS

## 2017-08-19 MED ORDER — INSULIN ASPART 100 UNIT/ML ~~LOC~~ SOLN
0.0000 [IU] | Freq: Three times a day (TID) | SUBCUTANEOUS | Status: DC
Start: 2017-08-19 — End: 2017-08-20
  Administered 2017-08-19: 3 [IU] via SUBCUTANEOUS
  Administered 2017-08-20: 1 [IU] via SUBCUTANEOUS

## 2017-08-19 MED ORDER — LACTATED RINGERS IV SOLN
INTRAVENOUS | Status: DC
  Administered 2017-08-19 (×2): via INTRAVENOUS

## 2017-08-19 MED ORDER — WARFARIN SODIUM 7.5 MG PO TABS
7.5000 mg | ORAL_TABLET | Freq: Once | ORAL | Status: AC
  Administered 2017-08-19: 7.5 mg via ORAL
  Filled 2017-08-19: qty 1

## 2017-08-19 MED ORDER — KETAMINE HCL-SODIUM CHLORIDE 100-0.9 MG/10ML-% IV SOSY
PREFILLED_SYRINGE | INTRAVENOUS | Status: AC
Start: 2017-08-19 — End: 2017-08-19
  Filled 2017-08-19: qty 10

## 2017-08-19 MED ORDER — DEXMEDETOMIDINE HCL 200 MCG/2ML IV SOLN
INTRAVENOUS | Status: DC | PRN
  Administered 2017-08-19 (×3): 8 ug via INTRAVENOUS

## 2017-08-19 NOTE — Interval H&P Note (Deleted)
History and Physical Interval Note:  08/19/2017 9:51 AM  Margot Ables  has presented today for surgery, with the diagnosis of fobt+ anemia, hx adenomatous colon polyps  The various methods of treatment have been discussed with the patient and family. After consideration of risks, benefits and other options for treatment, the patient has consented to  Procedure(s): COLONOSCOPY (N/A) ESOPHAGOGASTRODUODENOSCOPY (EGD) WITH PROPOFOL (N/A) as a surgical intervention .  The patient's history has been reviewed, patient examined, no change in status, stable for surgery.  I have reviewed the patient's chart and labs.  Questions were answered to the patient's satisfaction.     Milus Banister  Anesthesia recommends strongly against EGD today given her continued  need (10 liters currently).

## 2017-08-19 NOTE — Clinical Social Work Note (Signed)
Clinical Social Work Assessment  Patient Details  Name: Jillian Wood MRN: [id number] Date of Birth: 02/13/1946  Date of referral:  08/19/17               Reason for consult:  Legal Concerns, Other (Comment Required)("unsafe living conditions")                Permission sought to share information with:    Permission granted to share information::     Name::        Agency::     Relationship::     Contact Information:     Housing/Transportation Living arrangements for the past 2 months:  Single Family Home Source of Information:  Patient, Adult Children Patient Interpreter Needed:  None Criminal Activity/Legal Involvement Pertinent to Current Situation/Hospitalization:  No - Comment as needed Significant Relationships:  Adult Children Lives with:  Adult Children Do you feel safe going back to the place where you live?  Yes Need for family participation in patient care:  Yes (Comment)  Care giving concerns: Patient from home with adult sons, one of whom is her caregiver.   Social Worker assessment / plan: CSW met with patient and son, James, at bedside and assessed for home safety concerns. CSW reflected documented EMS concerns for safety in the home. Patient and son indicated there were no concerns, though endorsed that there is some clutter in the home they are working on cleaning out. Patient also lives with another adult son, for whom James provides care. Patient and son indicated they have all durable medical equipment patient needs. PT recommended home health; patient declined due to being unable to afford co-pays. RNCM aware.   Employment status:  Retired Insurance information:  Managed Care(HTA) PT Recommendations:  Home with Home Health Information / Referral to community resources:     Patient/Family's Response to care: Did not discuss response to care.  Patient/Family's Understanding of and Emotional Response to Diagnosis, Current Treatment, and Prognosis:  Did not  discuss response to care.  Emotional Assessment Appearance:  Appears stated age Attitude/Demeanor/Rapport:  Other(appropriate) Affect (typically observed):  Calm, Blunt Orientation:  Oriented to Situation, Oriented to Self, Oriented to Place, Oriented to  Time Alcohol / Substance use:  Not Applicable Psych involvement (Current and /or in the community):  No (Comment)  Discharge Needs  Concerns to be addressed:  Home Safety Concerns Readmission within the last 30 days:  No Current discharge risk:  Physical Impairment, Other(home safety concerns) Barriers to Discharge:  Continued Medical Work up    , LCSW 08/19/2017, 4:58 PM  

## 2017-08-19 NOTE — Anesthesia Preprocedure Evaluation (Addendum)
Anesthesia Evaluation  Patient identified by MRN, date of birth, ID band Patient awake    Reviewed: Allergy & Precautions, NPO status , Patient's Chart, lab work & pertinent test results  Airway Mallampati: II  TM Distance: >3 FB Neck ROM: Full    Dental no notable dental hx.    Pulmonary sleep apnea , COPD, former smoker,   Requiring 10L of oxygen to maintain saturation of 90%   + decreased breath sounds      Cardiovascular hypertension, +CHF  + dysrhythmias Atrial Fibrillation  Rhythm:Irregular Rate:Normal  Left ventricle: The cavity size was normal. Wall thickness was normal. Systolic function was normal. The estimated ejection fraction was in the range of 55% to 60%. - Mitral valve: Calcified annulus. Mildly thickened leaflets . There was mild regurgitation. - Left atrium: The atrium was severely dilated. - Right ventricle: The cavity size was mildly dilated. Systolic function was mildly to moderately reduced. - Right atrium: The atrium was severely dilated     Neuro/Psych negative neurological ROS  negative psych ROS   GI/Hepatic negative GI ROS, Neg liver ROS,   Endo/Other  diabetesHypothyroidism Morbid obesity  Renal/GU Renal InsufficiencyRenal disease  negative genitourinary   Musculoskeletal negative musculoskeletal ROS (+)   Abdominal   Peds negative pediatric ROS (+)  Hematology  (+) anemia ,   Anesthesia Other Findings   Reproductive/Obstetrics negative OB ROS                           Anesthesia Physical Anesthesia Plan  ASA: IV  Anesthesia Plan: MAC   Post-op Pain Management:    Induction: Intravenous  PONV Risk Score and Plan:   Airway Management Planned: Simple Face Mask  Additional Equipment:   Intra-op Plan:   Post-operative Plan:   Informed Consent: I have reviewed the patients History and Physical, chart, labs and discussed the procedure  including the risks, benefits and alternatives for the proposed anesthesia with the patient or authorized representative who has indicated his/her understanding and acceptance.   Dental advisory given  Plan Discussed with: CRNA and Surgeon  Anesthesia Plan Comments: (Upper endoscopy noit feasible due to patients respiratory status)       Anesthesia Quick Evaluation

## 2017-08-19 NOTE — Transfer of Care (Signed)
Immediate Anesthesia Transfer of Care Note  Patient: Jillian Wood  Procedure(s) Performed: COLONOSCOPY (N/A )  Patient Location: Endoscopy Unit  Anesthesia Type:MAC  Level of Consciousness: awake, alert  and oriented  Airway & Oxygen Therapy: Patient connected to nasal cannula oxygen  Post-op Assessment: Post -op Vital signs reviewed and stable  Post vital signs: stable  Last Vitals:  Vitals:   08/19/17 0920 08/19/17 1057  BP: (!) 117/59 106/71  Pulse: 98 98  Resp: 15 (!) 21  Temp: (!) 36.3 C 36.9 C  SpO2: 92% (!) 87%    Last Pain:  Vitals:   08/19/17 1057  TempSrc: Oral  PainSc:       Patients Stated Pain Goal: 0 (89/37/34 2876)  Complications: No apparent anesthesia complications

## 2017-08-19 NOTE — Interval H&P Note (Signed)
History and Physical Interval Note:  08/19/2017 10:07 AM  Jillian Wood  has presented today for surgery, with the diagnosis of fobt+ anemia, hx adenomatous colon polyps  The various methods of treatment have been discussed with the patient and family. After consideration of risks, benefits and other options for treatment, the patient has consented to  Procedure(s): COLONOSCOPY (N/A) ESOPHAGOGASTRODUODENOSCOPY (EGD) WITH PROPOFOL (N/A) as a surgical intervention .  The patient's history has been reviewed, patient examined, no change in status, stable for surgery.  I have reviewed the patient's chart and labs.  Questions were answered to the patient's satisfaction.     Milus Banister  Initially there was concern about EGD portion of today's procedures. I spoke with Dr. Kalman Shan.  She is satting 90-95% on 7 liters currently (this is her baseline o2 requirement at home).  Will proceed with colonoscopy and if that goes smoothly will attempt EGD with idea of quick abort if any significant desturation.

## 2017-08-19 NOTE — Progress Notes (Signed)
ANTICOAGULATION CONSULT NOTE - Initial Consult  Pharmacy Consult for coumadin Indication: atrial fibrillation  Allergies  Allergen Reactions  . Inderal [Propranolol] Other (See Comments)    Hair loss  . Ace Inhibitors Cough    Patient Measurements: Height: 5\' 2"  (157.5 cm) Weight: 241 lb 8 oz (109.5 kg) IBW/kg (Calculated) : 50.1   Vital Signs: Temp: 98.6 F (37 C) (12/04 1500) Temp Source: Oral (12/04 1500) BP: 102/72 (12/04 1500) Pulse Rate: 123 (12/04 1500)  Labs: Recent Labs    08/17/17 0257 08/18/17 0159 08/18/17 1001 08/19/17 0345  HGB 10.0* 10.2*  --   --   HCT 34.7* 35.9*  --   --   PLT 289 270  --   --   LABPROT  --   --  15.0  --   INR  --   --  1.19  --   CREATININE 1.15* 1.07*  --  1.00    Estimated Creatinine Clearance: 60.2 mL/min (by C-G formula based on SCr of 1 mg/dL).   Medical History: Past Medical History:  Diagnosis Date  . Arthritis   . CHF (congestive heart failure) ([place])   . COPD (chronic obstructive pulmonary disease) ([place])   . Depression   . Diabetes mellitus type 2 in obese ([place])   . GERD (gastroesophageal reflux disease)   . Hyperlipidemia   . Hypertension   . Morbid obesity ([place])   . OAB (overactive bladder)   . PSVT (paroxysmal supraventricular tachycardia) ([place])   . Thyroid disease   . Vitamin D deficiency     Medications:  Medications Prior to Admission  Medication Sig Dispense Refill Last Dose  . Acetaminophen (TYLENOL 8 HOUR ARTHRITIS PAIN PO) Take 1 tablet by mouth. PRN   08/12/2017 at prn  . acetaminophen-codeine (TYLENOL #3) 300-30 MG tablet Take 1 tablet by mouth every 8 (eight) hours as needed for moderate pain or severe pain. 90 tablet 0 Past Week at prn  . acetaZOLAMIDE (DIAMOX) 250 MG tablet TAKE 1 TABLET (250 MG TOTAL) BY MOUTH 3 (THREE) TIMES DAILY. 270 tablet 1 08/13/2017 at Unknown time  . albuterol (PROVENTIL HFA;VENTOLIN HFA) 108 (90 Base) MCG/ACT inhaler Inhale 1-2 puffs into the lungs every 6 (six)  hours as needed for wheezing or shortness of breath. 18 g 2 08/13/2017 at prn  . aspirin 81 MG tablet Take 81 mg by mouth daily.   08/13/2017 at Unknown time  . bisoprolol (ZEBETA) 10 MG tablet TAKE 1 TABLET (10 MG TOTAL) BY MOUTH DAILY. 90 tablet 1 08/13/2017 at Unknown time  . budesonide-formoterol (SYMBICORT) 160-4.5 MCG/ACT inhaler Inhale 2 puffs into the lungs 2 (two) times daily. 1 Inhaler 0 08/13/2017 at Unknown time  . Cholecalciferol (VITAMIN D PO) Take 2,000 Units by mouth daily.    08/13/2017 at Unknown time  . diazepam (VALIUM) 5 MG tablet Take one tablet up to 2 x a daily for anxiety AS NEEDED 60 tablet 0 08/13/2017 at Unknown time  . diltiazem (CARDIZEM CD) 300 MG 24 hr capsule TAKE 1 CAPSULE (300 MG TOTAL) BY MOUTH DAILY. 90 capsule 1 08/13/2017 at Unknown time  . DULoxetine (CYMBALTA) 60 MG capsule TAKE 1 CAPSULE BY MOUTH DAILY (Patient taking differently: TAKE 60 mg CAPSULE BY MOUTH DAILY) 90 capsule 1 08/13/2017 at Unknown time  . famotidine (PEPCID) 20 MG tablet TAKE 1 TABLET (20 MG TOTAL) BY MOUTH 2 (TWO) TIMES DAILY. FOR ACID REFLUX 180 tablet 1 08/13/2017 at Unknown time  . ferrous sulfate 325 (65 FE)  MG tablet Take 325 mg by mouth 2 (two) times daily with a meal.   08/13/2017 at Unknown time  . fluticasone (FLONASE) 50 MCG/ACT nasal spray Place 2 sprays into both nostrils daily. (Patient taking differently: Place 2 sprays into both nostrils daily as needed for allergies. ) 16 g 0 Past Week at prn  . furosemide (LASIX) 80 MG tablet Take 1 tablet (80 mg total) by mouth daily. 90 tablet 0 08/13/2017 at Unknown time  . guaiFENesin (MUCINEX) 600 MG 12 hr tablet Take 600 mg by mouth 2 (two) times daily.    08/13/2017 at Unknown time  . levothyroxine (SYNTHROID, LEVOTHROID) 175 MCG tablet TAKE 1 TABLET (175 MCG TOTAL) BY MOUTH DAILY BEFORE BREAKFAST. 90 tablet 1 08/13/2017 at 1100  . loratadine-pseudoephedrine (CLARITIN-D 24-HOUR) 10-240 MG per 24 hr tablet Take 1 tablet by mouth as  needed for allergies.    08/11/2017 at prn  . losartan (COZAAR) 50 MG tablet TAKE 1 TABLET BY MOUTH DAILY (Patient taking differently: TAKE 50 mg TABLET BY MOUTH DAILY) 90 tablet 2 08/13/2017 at Unknown time  . Magnesium Chloride (MAGNESIUM DR PO) Take 1 capsule by mouth daily.    08/13/2017 at Unknown time  . metFORMIN (GLUCOPHAGE-XR) 500 MG 24 hr tablet TAKE 1 TABLET (500 MG TOTAL) BY MOUTH 4 (FOUR) TIMES DAILY - AFTER MEALS AND AT BEDTIME. 360 tablet 1 08/13/2017 at Unknown time  . OXYGEN-HELIUM IN Inhale 5-6 L into the lungs continuous.    08/13/2017 at Unknown time  . pravastatin (PRAVACHOL) 40 MG tablet TAKE 1 TABLET (40 MG TOTAL) BY MOUTH DAILY. 90 tablet 1 08/13/2017 at Unknown time  . warfarin (COUMADIN) 5 MG tablet Take 1-1.5 tablets (5-7.5 mg total) by mouth daily at 6 PM. TAKES 7.5MG  ON TUES, THURS, SUNDAY TAKES 5MG  ALL OTHER DAYS 30 tablet 2 08/13/2017 at Unknown time  . budesonide (PULMICORT) 0.5 MG/2ML nebulizer solution Take 2 mLs (0.5 mg total) by nebulization 2 (two) times daily. (Patient not taking: Reported on 08/12/2017) 360 mL 3 Not Taking at Unknown time   Scheduled:  . bisoprolol  10 mg Oral Daily  . chlorhexidine  15 mL Mouth Rinse BID  . diltiazem  300 mg Oral Daily  . DULoxetine  60 mg Oral Daily  . famotidine  20 mg Oral BID  . furosemide  80 mg Oral Daily  . guaiFENesin  1,200 mg Oral BID  . insulin aspart  0-9 Units Subcutaneous TID WC  . ipratropium  0.5 mg Nebulization Q6H  . levalbuterol  0.63 mg Nebulization Q6H  . levothyroxine  175 mcg Oral QAC breakfast  . mouth rinse  15 mL Mouth Rinse q12n4p  . mometasone-formoterol  2 puff Inhalation BID  . polyethylene glycol  17 g Oral BID  . pravastatin  40 mg Oral q1800  . sodium chloride flush  3 mL Intravenous Q12H    Assessment: 71 yo female with HF on coumadin for afib (CHADSVASC= 5). She was noted heme positive with anemia and per chart notes colonoscopy is unremarkable.  Pharmacy consulted to resume  coumadin.  -INR= 1.19  PTA Coumadin: 5 mg MWFSat, 7.5 mg TTSun. last dose  5mg  on 11/28, day of admission  Goal of Therapy:  INR 2-3 Monitor platelets by anticoagulation protocol: Yes   Plan:  -Coumadin 7.5mg  po today -Daily PT/INR  Hildred Laser, Pharm D 08/19/2017 4:57 PM

## 2017-08-19 NOTE — Anesthesia Postprocedure Evaluation (Signed)
Anesthesia Post Note  Patient: Jillian Wood  Procedure(s) Performed: COLONOSCOPY (N/A )     Patient location during evaluation: PACU Anesthesia Type: MAC Level of consciousness: awake and alert Pain management: pain level controlled Vital Signs Assessment: post-procedure vital signs reviewed and stable Respiratory status: spontaneous breathing, nonlabored ventilation, respiratory function stable and patient connected to nasal cannula oxygen Cardiovascular status: stable and blood pressure returned to baseline Postop Assessment: no apparent nausea or vomiting Anesthetic complications: no    Last Vitals:  Vitals:   08/19/17 0920 08/19/17 1057  BP: (!) 117/59 106/71  Pulse: 98 98  Resp: 15 (!) 21  Temp: (!) 36.3 C 36.9 C  SpO2: 92% (!) 87%    Last Pain:  Vitals:   08/19/17 1057  TempSrc: Oral  PainSc:                  Aylana Hirschfeld S

## 2017-08-19 NOTE — Op Note (Addendum)
[facility] Patient Name: Jillian Wood Procedure Date : 08/19/2017 MRN: [id number] Attending MD: Milus Banister , MD Date of Birth: 27-Nov-1945 CSN: [id number] Age: 71 Admit Type: Inpatient Procedure:                Colonoscopy Indications:              Heme positive stool, anemia Providers:                Milus Banister, MD, Cleda Daub, RN, Cherylynn Ridges, Technician, Theodoro Grist, CRNA Referring MD:              Medicines:                Monitored Anesthesia Care Complications:            No immediate complications. Estimated blood loss:                            None. Estimated Blood Loss:     Estimated blood loss: none. Procedure:                Pre-Anesthesia Assessment:                           - Prior to the procedure, a History and Physical                            was performed, and patient medications and                            allergies were reviewed. The patient's tolerance of                            previous anesthesia was also reviewed. The risks                            and benefits of the procedure and the sedation                            options and risks were discussed with the patient.                            All questions were answered, and informed consent                            was obtained. Prior Anticoagulants: The patient has                            taken Coumadin (warfarin), last dose was 5 days                            prior to procedure. ASA Grade Assessment: IV - A  patient with severe systemic disease that is a                            constant threat to life. After reviewing the risks                            and benefits, the patient was deemed in                            satisfactory condition to undergo the procedure.                           After obtaining informed consent, the colonoscope                            was passed under direct  vision. Throughout the                            procedure, the patient's blood pressure, pulse, and                            oxygen saturations were monitored continuously. The                            Colonoscope was introduced through the anus and                            advanced to the the cecum, identified by                            appendiceal orifice and ileocecal valve. The                            colonoscopy was performed without difficulty. The                            patient tolerated the procedure fairly well. The                            quality of the bowel preparation was adequate to                            identify polyps 6 mm and larger in size. The                            ileocecal valve, appendiceal orifice, and rectum                            were photographed. Scope In: 10:31:24 AM Scope Out: 10:46:30 AM Scope Withdrawal Time: 0 hours 5 minutes 8 seconds  Total Procedure Duration: 0 hours 15 minutes 6 seconds  Findings:      The entire examined colon appeared normal on direct and retroflexion       views. Impression:               -  The entire examined colon is normal on direct and                            retroflexion views.                           - No polyps or cancers. Moderate Sedation:      none Recommendation:           - Return patient to hospital ward for ongoing care.                           - Advance diet as tolerated.                           - Continue present medications.                           - She had signficant desaturation from presadex and                            ketamine during this examination. Anesthesia did                            not feel it was safe to proceed with the EGD                            without intubating her but also feels strongly that                            if she is intubated it will be very difficult to                            extubate her. I agree with them. She has been  in                            hospital with CHF exacerbation with 7-8 liter                            diuresis and is actually at her baseline O2                            requirement (6-8 liters at home). I don't think she                            should have any further invasive tests such EGD,                            other GI workup for her heme + anemia unless she                            has life threatening overt bleeding. Instead she  should stay on iron supplement once daily,                            indefinitely. She should be on PPI once daily,                            indefinitely. Her blood counts should be monitored                            by PCP every 6-8 weeks and she should get a blood                            transfusion if Hb 7 or less.                           - OK to resume her blood thinners today. Procedure Code(s):        --- Professional ---                           [phone], Colonoscopy, flexible; diagnostic, including                            collection of specimen(s) by brushing or washing,                            when performed (separate procedure) Diagnosis Code(s):        --- Professional ---                           R19.5, Other fecal abnormalities CPT copyright 2016 American Medical Association. All rights reserved. The codes documented in this report are preliminary and upon coder review may  be revised to meet current compliance requirements. Milus Banister, MD 08/19/2017 10:57:27 AM This report has been signed electronically. Number of Addenda: 0

## 2017-08-19 NOTE — Progress Notes (Addendum)
PROGRESS NOTE  Jillian Wood GMW:[id number] DOB: Jul 04, 1946 DOA: 08/13/2017 PCP: Unk Pinto, MD  Brief Narrative: 71yow with acute hypoxia via EMS, reported DOE, orthopnea, leg edema. Admitted for acute CHF, acute on chronic hypoxia, CHF. Seen by cardiology and treated with IV diuresis with gradual clinical improvement. Afib/flutter responded to rate control. Anemia was noted without overt bleeding and patient was seen by GI, underwent colonoscopy but EGD was not attempted because of respiratory status. At this point appears to be nearing baseline and would anticipate home 12/5.  Assessment/Plan Acute on chronic hypoxic resp failure. Treated with BiPAP. - appears to be near baseline on B and E  Acute on chronic diastolic CHF - Minus 8+L since admission.  - continue oral furosemide per cardiology. Take an additional 40 mg for weight gain of 2-3 pounds. Low-sodium diet and fluid restriction to 1.5 L daily - f/u with Dr. Gwenlyn Found outpatient  AKI. Resolving. Hold ARB. - no further evaluation suggested  Normocytic anemia - stable s/p PRBC 2 units 11/30 - GI rec PPI - colonoscopy unremarkable 12/4; EGD deferred permanently secondary to respiratory status, unless for overt life-threatening bleeding. - GI rec check CBC q6-8 weeks and transfuse as needed; iron once daily indefinitely. PPI once daily indefinitely.  - may resume warfarin today  Afib/flutter. CHA2DS2-VASc 5 - resume warfarin - continue bisoprolol and cardizem. May need to increase dose if HR does not settle down.  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 - stable  DM type 2 - remains stable.   OSA/CPAP - BiPAP QHS and PRN  Morbid obesity - per dietician  Overall improving. Start warfarin. Monitor overnight. Likely home 12/5.  DVT prophylaxis: SCDs Code Status: DNR Family Communication: none Disposition Plan: home    Murray Hodgkins, MD  [facility] Direct contact:  [phone] --[place] [place]  --[email or web address]; password [email or web address]  7PM-7AM contact night coverage as above 08/19/2017, 4:31 PM  LOS: 5 days   Consultants:  Cardiology  GI  Procedures:  11/29 Echo Study Conclusions  - Left ventricle: The cavity size was normal. Wall thickness was   normal. Systolic function was normal. The estimated ejection   fraction was in the range of 55% to 60%. - Mitral valve: Calcified annulus. Mildly thickened leaflets .   There was mild regurgitation. - Left atrium: The atrium was severely dilated. - Right ventricle: The cavity size was mildly dilated. Systolic   function was mildly to moderately reduced. - Right atrium: The atrium was severely dilated.  2 units PRBC 11/30  Colonoscopy Impression:               - The entire examined colon is normal on direct and retroflexion views.                           - No polyps or cancers.  She had signficant desaturation from presadex and                            ketamine during this examination. Anesthesia did                            not feel it was safe to proceed with the EGD  without intubating her but also feels strongly that                            if she is intubated it will be very difficult to                            extubate her. I agree with them. She has been in                            hospital with CHF exacerbation with 7-8 liter                            diuresis and is actually at her baseline O2                            requirement (6-8 liters at home). I don't think she                            should have any further invasive tests such EGD,                            other GI workup for her heme + anemia unless she                            has life threatening overt bleeding. Instead she                            should stay on iron supplement once daily,                            indefinitely. She should be on PPI once daily,                             indefinitely. Her blood counts should be monitored                            by PCP every 6-8 weeks and she should get a blood                            transfusion if Hb 7 or less.                           - OK to resume her blood thinners today.    Antimicrobials:    Interval history/Subjective: Feels well. Breathing fine. No complaints.  RN reported narrow complex tachycardia up to 120s.  Objective: Vitals:  Vitals:   08/19/17 1329 08/19/17 1500  BP: 124/82 102/72  Pulse:  (!) 123  Resp:  (!) 21  Temp:  98.6 F (37 C)  SpO2:  91%    Exam:  Constitutional:   . Appears calm and comfortable Respiratory:  . CTA bilaterally, no w/r/r.  . Respiratory effort normal.   Cardiovascular:  . Tachycardic, irregular, no m/r/g. Telemetry 90-130s . No  significant LE extremity edema   Psychiatric:  . Mental status o Mood, affect appropriate   I have personally reviewed the following:   UOP: -1600 mL I/O since admission: -8.2 L Last BM charted: 12/3 Telemetry: afib Status: SDU  Labs:  CBG stable  BMP unremarkable  Scheduled Meds: . bisoprolol  10 mg Oral Daily  . chlorhexidine  15 mL Mouth Rinse BID  . diltiazem  300 mg Oral Daily  . DULoxetine  60 mg Oral Daily  . famotidine  20 mg Oral BID  . furosemide  80 mg Oral Daily  . guaiFENesin  1,200 mg Oral BID  . ipratropium  0.5 mg Nebulization Q6H  . levalbuterol  0.63 mg Nebulization Q6H  . levothyroxine  175 mcg Oral QAC breakfast  . mouth rinse  15 mL Mouth Rinse q12n4p  . mometasone-formoterol  2 puff Inhalation BID  . polyethylene glycol  17 g Oral BID  . pravastatin  40 mg Oral q1800  . sodium chloride flush  3 mL Intravenous Q12H   Continuous Infusions: . sodium chloride      Principal Problem:   Acute on chronic diastolic CHF (congestive heart failure) (H[facility]) Active Problems:   Essential hypertension   Morbid obesity (H[facility])   Acute on chronic respiratory failure with hypoxia  (H[facility])   Diabetes mellitus type 2 in obese ([facility])   OSA (obstructive sleep apnea)   Anemia   Acute renal insufficiency   DNR (do not resuscitate)   Dyspnea   Acute on chronic diastolic (congestive) heart failure (HCC)   Gastrointestinal hemorrhage   LOS: 5 days

## 2017-08-20 ENCOUNTER — Encounter (HOSPITAL_COMMUNITY): Payer: Self-pay | Admitting: Gastroenterology

## 2017-08-20 LAB — GLUCOSE, CAPILLARY
GLUCOSE-CAPILLARY: 129 mg/dL — AB (ref 65–99)
GLUCOSE-CAPILLARY: 101 mg/dL — AB (ref 65–99)

## 2017-08-20 LAB — PROTIME-INR
INR: 1.1
Prothrombin Time: 14.2 seconds (ref 11.4–15.2)

## 2017-08-20 MED ORDER — POLYETHYLENE GLYCOL 3350 17 G PO PACK: 17.0000 g | PACK | Freq: Two times a day (BID) | ORAL | 0 refills | Status: AC

## 2017-08-20 MED ORDER — WARFARIN SODIUM 7.5 MG PO TABS: 7.5000 mg | ORAL_TABLET | Freq: Once | ORAL | Status: DC

## 2017-08-20 MED ORDER — PANTOPRAZOLE SODIUM 40 MG PO TBEC
40.0000 mg | DELAYED_RELEASE_TABLET | Freq: Every day | ORAL | Status: DC
  Administered 2017-08-20: 40 mg via ORAL
  Filled 2017-08-20: qty 1

## 2017-08-20 MED ORDER — PANTOPRAZOLE SODIUM 40 MG PO TBEC: 40.0000 mg | DELAYED_RELEASE_TABLET | Freq: Every day | ORAL | 0 refills | Status: DC

## 2017-08-20 MED ORDER — IPRATROPIUM BROMIDE 0.02 % IN SOLN: 0.5000 mg | Freq: Four times a day (QID) | RESPIRATORY_TRACT | 12 refills | Status: AC

## 2017-08-20 MED ORDER — LEVALBUTEROL HCL 0.63 MG/3ML IN NEBU: 0.6300 mg | INHALATION_SOLUTION | Freq: Four times a day (QID) | RESPIRATORY_TRACT | 12 refills | Status: DC

## 2017-08-20 MED ORDER — FERROUS SULFATE 325 (65 FE) MG PO TABS: 325.0000 mg | ORAL_TABLET | Freq: Every day | ORAL | Status: DC
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
## 2017-08-20 NOTE — Progress Notes (Signed)
Discharge instruction was given to pt.  Pt is going home with portable o2 brought from home.  Belongings were sent home with pt.  Idolina Primer, RN

## 2017-08-20 NOTE — Progress Notes (Addendum)
Discharge Planning: NCM did follow up with pt on HH. States she is on a fixed income and cannot afford HH. Contacted [facility] rep, Cory for [employer] program to make aware. Pt has declined [facility]. She has oxygen ([facility]) high flow tank up to 10L, CPAP, RW and bedside commode at home. Son at home to assist with care. Contacted [facility] for nebulizer machine for home to be delivered to room prior to dc. Jonnie Finner RN CCM Case Mgmt phone [phone]   08/20/2017 217 NCM spoke to pt and son, [name] at bedside. He provides 24 hour care for pt at home. She has Bipap, and neb machine at home. He has declined HH along with pt. She is on a fixed income and cannot afford. Explained the [facility] will bill for copay. She declines. Jonnie Finner RN CCM Case Mgmt phone [phone]

## 2017-08-20 NOTE — Care Management Important Message (Signed)
Important Message  Patient Details  Name: Jillian Wood MRN: [id number] Date of Birth: 1946-08-13   Medicare Important Message Given:  Yes    Torii Royse Abena 08/20/2017, 10:30 AM

## 2017-08-20 NOTE — Progress Notes (Signed)
[place] for coumadin Indication: atrial fibrillation  Allergies  Allergen Reactions  . Inderal [Propranolol] Other (See Comments)    Hair loss  . Ace Inhibitors Cough    Patient Measurements: Height: 5\' 2"  (157.5 cm) Weight: 239 lb 3.2 oz (108.5 kg) IBW/kg (Calculated) : 50.1   Vital Signs: Temp: 98.2 F (36.8 C) (12/05 0500) Temp Source: Axillary (12/05 0500) BP: 103/62 (12/05 0500) Pulse Rate: 61 (12/05 0500)  Labs: Recent Labs    08/18/17 0159 08/18/17 1001 08/19/17 0345 08/20/17 0337  HGB 10.2*  --   --   --   HCT 35.9*  --   --   --   PLT 270  --   --   --   LABPROT  --  15.0  --  14.2  INR  --  1.19  --  1.10  CREATININE 1.07*  --  1.00  --     Assessment: 71 yo female with HF on coumadin for afib (CHADSVASC= 5). Held on admission 2/2 to heme positive and anemia. Colonoscopy on 12/4 was unremarkable and coumadin resumed 12/4 pm.   INR 1.10. CBC from 12/3 stable. No reported bleeding.     PTA Coumadin: 5 mg MWFSat, 7.5 mg TTSun. last dose  5mg  on 11/28, day of admission  Goal of Therapy:  INR 2-3 Monitor platelets by anticoagulation protocol: Yes   Plan:  -Coumadin 7.5mg  x 1 this evening  -Daily PT/INR -CBC in am   Vincenza Hews, PharmD, BCPS 08/20/2017, 8:37 AM

## 2017-08-24 DIAGNOSIS — J449 Chronic obstructive pulmonary disease, unspecified: Secondary | ICD-10-CM | POA: Diagnosis not present

## 2017-08-25 ENCOUNTER — Ambulatory Visit: Payer: PPO | Admitting: Pulmonary Disease

## 2017-08-25 NOTE — Discharge Summary (Signed)
[facility] Discharge Summary   Patient: Jillian Wood NGE:[id number]   PCP: Jillian Pinto, MD DOB: 1946/03/10   Date of admission: 08/13/2017   Date of discharge: 08/20/2017   Discharge Diagnoses:  Principal Problem:   Acute on chronic diastolic CHF (congestive heart failure) ([place]) Active Problems:   Essential hypertension   Morbid obesity ([place])   Acute on chronic respiratory failure with hypoxia ([place])   Diabetes mellitus type 2 in obese ([place])   OSA (obstructive sleep apnea)   Anemia   Acute renal insufficiency   DNR (do not resuscitate)   Dyspnea   Acute on chronic diastolic (congestive) heart failure ([place])   Gastrointestinal hemorrhage   Admitted From: home Disposition:  home  Recommendations for Outpatient Follow-up:  1. Please follow-up with cardiology and PCP.  Follow-up Information    Jillian Wood, Jillian Croon, PA-C Follow up on 09/04/2017.   Specialties:  Cardiology, Radiology Why:  Cardiology Hospital Follow-Up on 09/04/2017 at 8:30AM with Jillian Ferries, PA-C (Dr. Kennon Holter Physician Assistant).  Contact information: [address] [place] [address] [phone]        Jillian Pinto, MD. Schedule an appointment as soon as possible for a visit in 1 week(s).   Specialty:  Internal Medicine Contact information: [address] [place] [address] [phone]          Diet recommendation: Cardiac diet  Activity: The patient is advised to gradually reintroduce usual activities.  Discharge Condition: good  Code Status: DNR/DNI  History of present illness: As per the H and P dictated on admission, " Jillian Wood is a 72 y.o. female with hx of DJD, CHF, COPD, depression, DM2, HL, HTN, obesity who was at her PCP's office yest and had blood work done.  She was called today and told Hb was 7.7.  When EMS arrived pt was SOB and O2 sats were mid 80's.  She was put on nonbreather mask and given albuterol 2.5 mg.  In ED  BP 116/ 64, RR 23, HR 60 atrial fib.  CXR showed bilat pulm edema.  Asked to see for admission.    Pt's son provides most of history.  She has had worsening DOE and now can't walk across the room due to SOB.  She denies any CP, prod cough, hemoptysis.  +orthopnea.  Her legs are more filled w/ fluid then usual.  She lives at home w/ her 3 sons.  Her husband died 68 mos ago.    She denies any recent abd pain, n/v/d, fevers, chills, rash.  She and her son both request no heroic measures, DNR status is requested.    Old chart >>  Nov 2017 - acute/ chron resp failure w hypoxia, multi factors including COPD/ OSA/ CHF.  Got IV abx, steroids, CPAP and O2 support. Need pulm rehab. Acute/ chron diast CHF.  New afib, severe CHADSVasc 4, on coumadin.  May 2018 - afib w RVR, acute/ chron diast CHF, a/c resp failure/ COPD on home O2. HTN/ DM. Diuresed 20 lbs, left at 247lbs.   Apr 2018 - acute/ chron resp failure, combination of COPD and diast CHF.  rx'd nebs, abx, lasix. Aflutter rx with po dilt / zebeta. Coumadin a/c."  Hospital Course:  Summary of her active problems in the hospital is as following. Acute on chronic hypoxic resp failure. Treated with BiPAP. - improving. Currently on [place]. Continue treatment as below.  Acute on chronic diastolic CHF - Minus 7L since admission. Now on oral  Lasix per cardiology. Would take an additional 40 mg for weight gain of 2-3 pounds. The patient needs low-sodium diet and fluid restriction to 1.5 L daily  AKI. Resolving. Hold ARB. - no further evaluation suggested  Normocytic anemia - stable s/p PRBC 2 units 11/30 - GI rec PPI, resume warfarin; colonoscopy/EGD 12/4.  Afib/flutter. CHA2DS2-VASc 5 - resume warfarin on discharge - continue bisoprolol and cardizem.  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 Highly recommend PCP and patient to consider option of hospice and have goals of care discussion  DM type 2 -  stable.   OSA/CPAP - BiPAP QHS and PRN  Morbid obesity - per dietician  All other chronic medical condition were stable during the hospitalization.  Patient was seen by physical therapy, who recommended SNF which patient refused.  Home health was arranged by Education officer, museum and case Freight forwarder. On the day of the discharge the patient's vitals were stable, and no other acute medical condition were reported by patient. the patient was felt safe to be discharge at home with home health.  Procedures and Results:  Echocardiogram    Consultations:  Cardiology   Gastroenterology  DISCHARGE MEDICATION: Allergies as of 08/20/2017      Reactions   Inderal [propranolol] Other (See Comments)   Hair loss   Ace Inhibitors Cough      Medication List    STOP taking these medications   budesonide-formoterol 160-4.5 MCG/ACT inhaler Commonly known as:  SYMBICORT   losartan 50 MG tablet Commonly known as:  COZAAR     TAKE these medications   acetaminophen-codeine 300-30 MG tablet Commonly known as:  TYLENOL #3 Take 1 tablet by mouth every 8 (eight) hours as needed for moderate pain or severe pain.   acetaZOLAMIDE 250 MG tablet Commonly known as:  DIAMOX TAKE 1 TABLET (250 MG TOTAL) BY MOUTH 3 (THREE) TIMES DAILY.   albuterol 108 (90 Base) MCG/ACT inhaler Commonly known as:  PROVENTIL HFA;VENTOLIN HFA Inhale 1-2 puffs into the lungs every 6 (six) hours as needed for wheezing or shortness of breath.   aspirin 81 MG tablet Take 81 mg by mouth daily.   bisoprolol 10 MG tablet Commonly known as:  ZEBETA TAKE 1 TABLET (10 MG TOTAL) BY MOUTH DAILY.   budesonide 0.5 MG/2ML nebulizer solution Commonly known as:  PULMICORT Take 2 mLs (0.5 mg total) by nebulization 2 (two) times daily.   diazepam 5 MG tablet Commonly known as:  VALIUM Take one tablet up to 2 x a daily for anxiety AS NEEDED   diltiazem 300 MG 24 hr capsule Commonly known as:  CARDIZEM CD TAKE 1 CAPSULE (300 MG  TOTAL) BY MOUTH DAILY.   DULoxetine 60 MG capsule Commonly known as:  CYMBALTA TAKE 1 CAPSULE BY MOUTH DAILY What changed:    how much to take  how to take this  when to take this   famotidine 20 MG tablet Commonly known as:  PEPCID TAKE 1 TABLET (20 MG TOTAL) BY MOUTH 2 (TWO) TIMES DAILY. FOR ACID REFLUX   ferrous sulfate 325 (65 FE) MG tablet Take 325 mg by mouth 2 (two) times daily with a meal.   fluticasone 50 MCG/ACT nasal spray Commonly known as:  FLONASE Place 2 sprays into both nostrils daily. What changed:    when to take this  reasons to take this   furosemide 80 MG tablet Commonly known as:  LASIX Take 1 tablet (80 mg total) by  mouth daily.   guaiFENesin 600 MG 12 hr tablet Commonly known as:  MUCINEX Take 600 mg by mouth 2 (two) times daily.   ipratropium 0.02 % nebulizer solution Commonly known as:  ATROVENT Take 2.5 mLs (0.5 mg total) by nebulization every 6 (six) hours.   levalbuterol 0.63 MG/3ML nebulizer solution Commonly known as:  XOPENEX Take 3 mLs (0.63 mg total) by nebulization every 6 (six) hours.   levothyroxine 175 MCG tablet Commonly known as:  SYNTHROID, LEVOTHROID TAKE 1 TABLET (175 MCG TOTAL) BY MOUTH DAILY BEFORE BREAKFAST.   loratadine-pseudoephedrine 10-240 MG 24 hr tablet Commonly known as:  CLARITIN-D 24-hour Take 1 tablet by mouth as needed for allergies.   MAGNESIUM DR PO Take 1 capsule by mouth daily.   metFORMIN 500 MG 24 hr tablet Commonly known as:  GLUCOPHAGE-XR TAKE 1 TABLET (500 MG TOTAL) BY MOUTH 4 (FOUR) TIMES DAILY - AFTER MEALS AND AT BEDTIME.   OXYGEN Inhale 5-6 L into the lungs continuous.   pantoprazole 40 MG tablet Commonly known as:  PROTONIX Take 1 tablet (40 mg total) by mouth daily at 6 (six) AM.   polyethylene glycol packet Commonly known as:  MIRALAX / GLYCOLAX Take 17 g by mouth 2 (two) times daily.   pravastatin 40 MG tablet Commonly known as:  PRAVACHOL TAKE 1 TABLET (40 MG TOTAL) BY  MOUTH DAILY.   TYLENOL 8 HOUR ARTHRITIS PAIN PO Take 1 tablet by mouth. PRN   VITAMIN D PO Take 2,000 Units by mouth daily.   warfarin 5 MG tablet Commonly known as:  COUMADIN Take 1-1.5 tablets (5-7.5 mg total) by mouth daily at 6 PM. TAKES 7.5MG  ON TUES, THURS, SUNDAY TAKES 5MG  ALL OTHER DAYS      Allergies  Allergen Reactions  . Inderal [Propranolol] Other (See Comments)    Hair loss  . Ace Inhibitors Cough   Discharge Instructions    Diet - low sodium heart healthy   Complete by:  As directed    Discharge instructions   Complete by:  As directed    It is important that you read following instructions as well as go over your medication list with RN to help you understand your care after this hospitalization.  Discharge Instructions: Please follow-up with PCP in one week  Please request your primary care physician to go over all Hospital Tests and Procedure/Radiological results at the follow up,  Please get all Hospital records sent to your PCP by signing hospital release before you go home.   Do not take more than prescribed Pain, Sleep and Anxiety Medications. You were cared for by a hospitalist during your hospital stay. If you have any questions about your discharge medications or the care you received while you were in the hospital after you are discharged, you can call the unit and ask to speak with the hospitalist on call if the hospitalist that took care of you is not available.  Once you are discharged, your primary care physician will handle any further medical issues. Please note that NO REFILLS for any discharge medications will be authorized once you are discharged, as it is imperative that you return to your primary care physician (or establish a relationship with a primary care physician if you do not have one) for your aftercare needs so that they can reassess your need for medications and monitor your lab values. You Must read complete instructions/literature  along with all the possible adverse reactions/side effects for all the Medicines you take  and that have been prescribed to you. Take any new Medicines after you have completely understood and accept all the possible adverse reactions/side effects. Wear Seat belts while driving.   Increase activity slowly   Complete by:  As directed      Discharge Exam: Filed Weights   08/18/17 0352 08/19/17 0400 08/20/17 0500  Weight: 109.8 kg (242 lb) 109.5 kg (241 lb 8 oz) 108.5 kg (239 lb 3.2 oz)   Vitals:   08/20/17 1100 08/20/17 1315  BP: (!) 100/57   Pulse: 86   Resp: (!) 23   Temp: 97.9 F (36.6 C)   SpO2: 91% 92%   General: Appear in no distress, no Rash; Oral Mucosa moist. Cardiovascular: S1 and S2 Present, no Murmur, no JVD Respiratory: Bilateral Air entry present and no Crackles, Occasional wheezes Abdomen: Bowel Sound present, Soft and no tenderness Extremities: no Pedal edema, no calf tenderness Neurology: Grossly no focal neuro deficit.  The results of significant diagnostics from this hospitalization (including imaging, microbiology, ancillary and laboratory) are listed below for reference.    Significant Diagnostic Studies: Dg Chest Port 1 View  Result Date: 08/15/2017 CLINICAL DATA:  Shortness of breath. EXAM: PORTABLE CHEST 1 VIEW COMPARISON:  Radiograph August 13, 2017. FINDINGS: Stable cardiomegaly with central pulmonary vascular congestion is noted. Stable diffuse interstitial densities are noted most consistent with pulmonary edema. Hypoinflation of the lungs is noted with mild bibasilar subsegmental atelectasis. No pneumothorax is noted. Bony thorax is unremarkable. IMPRESSION: Stable cardiomegaly and central pulmonary vascular congestion is noted with probable bilateral pulmonary edema. Hypoinflation of the lungs is noted with mild bibasilar subsegmental atelectasis. Electronically Signed   By: Marijo Conception, M.D.   On: 08/15/2017 09:38   Dg Chest Portable 1  View  Result Date: 08/13/2017 CLINICAL DATA:  Shortness of breath EXAM: PORTABLE CHEST 1 VIEW COMPARISON:  12/09/2016, 11/11/2016, 07/31/2016 FINDINGS: Rotated patient. Low lung volumes. Enlarged cardiomediastinal silhouette with vascular congestion and diffuse interstitial edema. Bibasilar infiltrates. Increased right hilar opacity similar compared to 2018 study, more prominent compared to 2017. No pneumothorax IMPRESSION: 1. Low lung volumes 2. Cardiomegaly with vascular congestion and diffuse interstitial edema 3. Right hilar opacity could be secondary to enlarged pulmonary vessels although mass is also considered. Correlation with contrast enhanced CT is suggested. Electronically Signed   By: Donavan Foil M.D.   On: 08/13/2017 23:25    Microbiology: No results found for this or any previous visit (from the past 240 hour(s)).   Labs: CBC: No results for input(s): WBC, NEUTROABS, HGB, HCT, MCV, PLT in the last 168 hours. Basic Metabolic Panel: Recent Labs  Lab 08/19/17 0345  NA 140  K 4.1  CL 89*  CO2 41*  GLUCOSE 127*  BUN 25*  CREATININE 1.00  CALCIUM 9.1  PHOS 3.6   Liver Function Tests: Recent Labs  Lab 08/19/17 0345  ALBUMIN 3.3*   BNP (last 3 results) Recent Labs    12/09/16 1750 08/14/17 1423 08/17/17 0257  BNP 472.3* 554.2* 610.4*   CBG: Recent Labs  Lab 08/19/17 1156 08/19/17 1541 08/19/17 2054 08/20/17 0749 08/20/17 1148  GLUCAP 138* 212* 87 129* 101*   Time spent: 35 minutes  Signed:  Berle Mull  [facility] 08/20/2017 , 11:59 AM

## 2017-08-26 ENCOUNTER — Encounter: Payer: Self-pay | Admitting: Pulmonary Disease

## 2017-08-27 ENCOUNTER — Ambulatory Visit: Payer: PPO | Admitting: Pulmonary Disease

## 2017-08-27 NOTE — Telephone Encounter (Signed)
Appt has been cancelled.  

## 2017-08-28 ENCOUNTER — Ambulatory Visit: Payer: Self-pay | Admitting: Internal Medicine

## 2017-08-30 ENCOUNTER — Other Ambulatory Visit: Payer: Self-pay | Admitting: Internal Medicine

## 2017-09-01 ENCOUNTER — Ambulatory Visit: Payer: PPO | Admitting: Pulmonary Disease

## 2017-09-02 ENCOUNTER — Ambulatory Visit: Payer: PPO

## 2017-09-02 ENCOUNTER — Encounter: Payer: Self-pay | Admitting: Internal Medicine

## 2017-09-02 ENCOUNTER — Ambulatory Visit (INDEPENDENT_AMBULATORY_CARE_PROVIDER_SITE_OTHER): Payer: PPO | Admitting: Internal Medicine

## 2017-09-02 VITALS — BP 128/78 | HR 88 | Temp 97.5°F | Resp 20 | Ht 62.0 in | Wt 249.4 lb

## 2017-09-02 DIAGNOSIS — J9622 Acute and chronic respiratory failure with hypercapnia: Secondary | ICD-10-CM | POA: Diagnosis not present

## 2017-09-02 DIAGNOSIS — E1122 Type 2 diabetes mellitus with diabetic chronic kidney disease: Secondary | ICD-10-CM | POA: Diagnosis not present

## 2017-09-02 DIAGNOSIS — N289 Disorder of kidney and ureter, unspecified: Secondary | ICD-10-CM | POA: Diagnosis not present

## 2017-09-02 DIAGNOSIS — J449 Chronic obstructive pulmonary disease, unspecified: Secondary | ICD-10-CM | POA: Diagnosis not present

## 2017-09-02 DIAGNOSIS — I10 Essential (primary) hypertension: Secondary | ICD-10-CM | POA: Diagnosis not present

## 2017-09-02 DIAGNOSIS — I5033 Acute on chronic diastolic (congestive) heart failure: Secondary | ICD-10-CM | POA: Diagnosis not present

## 2017-09-02 DIAGNOSIS — I482 Chronic atrial fibrillation, unspecified: Secondary | ICD-10-CM

## 2017-09-02 DIAGNOSIS — Z7901 Long term (current) use of anticoagulants: Secondary | ICD-10-CM

## 2017-09-02 DIAGNOSIS — N183 Chronic kidney disease, stage 3 unspecified: Secondary | ICD-10-CM

## 2017-09-02 DIAGNOSIS — Z79899 Other long term (current) drug therapy: Secondary | ICD-10-CM

## 2017-09-02 DIAGNOSIS — K922 Gastrointestinal hemorrhage, unspecified: Secondary | ICD-10-CM

## 2017-09-02 DIAGNOSIS — G4733 Obstructive sleep apnea (adult) (pediatric): Secondary | ICD-10-CM | POA: Diagnosis not present

## 2017-09-02 NOTE — Progress Notes (Signed)
Jillian Wood     This very nice 71 y.o. recently widowed WF was admitted to the hospital on 08/13/2017  and patient was discharged from the hospital on 12/5/20178. The patient now presents for follow up for transition from recent hospitalization.  The day after discharge our clinical staff contacted the patient to assure stability and schedule a follow up appointment. The discharge summary, medications and diagnostic test results were reviewed before meeting with the patient. The patient was admitted for:  Name  . Gastrointestinal hemorrhage, unspecified gastrointestinal hemorrhage type  . Acute on chronic diastolic CHF (congestive heart failure) ([place])  . Essential hypertension  . Acute on chronic respiratory failure with hypercapnia ([place])  . OSA and COPD overlap syndrome ([place])  . Atrial fibrillation, chronic (HCC)   T2_NIDDM   Hypothyroidism  . Acute renal insufficiency  . Current use of long term anticoagulation      Patient has hx/o End Stage O2 dependent COPD on 7-9 Lit/min nasal O2 at home also w/ hx/o CO2 retention. Patient has been on Coumadin since Nov 2107 for pAfib  (CHADSVasc 4). Patient recently had an asymptomatic drop in Hgb to 7.7gm% discovered at her coag check and she was referred for hospitalization and was transfused. She was felt to have acute on chronic CHF and diureses. She also was identified with Acute kidney injury. She required BiPAP. She was felt also to have acute on chronic Respiratory Failure as O2 sats were in the 80's on 8-9 L/M w/ CXR showing Pulmonary Edema. On 12/4 she had a negative colonoscopy and EGD was deferred due to overall poor condition and presuming her blood loss was Upper GI, she was advised switch from famotidine to Pantoprazole and allowed to resume Coumadin.     Patient is extremely deconditioned and has very unstable gait and is considered very high fall risk and also in consideration of her significant GI bleed, she is not felt a  candidate to continue coumadin.      Hospitalization discharge instructions and medications are reconciled with the patient and caretaker son.      Patient is also followed with Hypertension, Hyperlipidemia, Pre-Diabetes, Hypothyroidism and Vitamin D Deficiency.      Patient is treated for HTN (1980's)  & BP has been controlled at home. Today's BP was at goal -  128/78. Patient has had no complaints of any cardiac type chest pain, palpitations, dyspnea/orthopnea/PND, dizziness, claudication, or dependent edema.     Hyperlipidemia is controlled with diet & meds. Patient denies myalgias or other med SE's. Last Lipids were at goal: Lab Results  Component Value Date   CHOL 139 05/26/2017   HDL 58 05/26/2017   LDLCALC 80 02/11/2017   TRIG 125 05/26/2017   CHOLHDL 2.4 05/26/2017      Also, the patient has history of  Morbid Obesity (BMI 45+) and T2_NIDDM (2011) / CKD 3 and she has had no symptoms of reactive hypoglycemia, diabetic polys, paresthesias or visual blurring.  Last A1c was near goal):  Lab Results  Component Value Date   HGBA1C 5.7 (H) 05/26/2017      Further, the patient also has history of Vitamin D Deficiency ("9"/2009) and supplements vitamin D without any suspected side-effects. Last vitamin D was at goal: Lab Results  Component Value Date   VD25OH 68 02/11/2017   Current Outpatient Medications on File Prior to Visit  Medication Sig  . Acetaminophen (TYLENOL 8 HOUR ARTHRITIS PAIN PO) Take 1 tablet by mouth. PRN  .  acetaminophen-codeine (TYLENOL #3) 300-30 MG tablet Take 1 tablet by mouth every 8 (eight) hours as needed for moderate pain or severe pain.  Marland Kitchen acetaZOLAMIDE (DIAMOX) 250 MG tablet TAKE 1 TABLET (250 MG TOTAL) BY MOUTH 3 (THREE) TIMES DAILY.  Marland Kitchen albuterol (PROVENTIL HFA;VENTOLIN HFA) 108 (90 Base) MCG/ACT inhaler Inhale 1-2 puffs into the lungs every 6 (six) hours as needed for wheezing or shortness of breath.  Marland Kitchen aspirin 81 MG tablet Take 81 mg by mouth daily.  .  bisoprolol (ZEBETA) 10 MG tablet TAKE 1 TABLET (10 MG TOTAL) BY MOUTH DAILY.  . budesonide (PULMICORT) 0.5 MG/2ML nebulizer solution Take 2 mLs (0.5 mg total) by nebulization 2 (two) times daily. (Patient not taking: Reported on 08/12/2017)  . Cholecalciferol (VITAMIN D PO) Take 2,000 Units by mouth daily.   . diazepam (VALIUM) 5 MG tablet Take one tablet up to 2 x a daily for anxiety AS NEEDED  . diltiazem (CARDIZEM CD) 300 MG 24 hr capsule TAKE 1 CAPSULE (300 MG TOTAL) BY MOUTH DAILY.  . DULoxetine (CYMBALTA) 60 MG capsule TAKE 1 CAPSULE BY MOUTH DAILY  . famotidine (PEPCID) 20 MG tablet TAKE 1 TABLET (20 MG TOTAL) BY MOUTH 2 (TWO) TIMES DAILY. FOR ACID REFLUX  . ferrous sulfate 325 (65 FE) MG tablet Take 325 mg by mouth 2 (two) times daily with a meal.  . fluticasone (FLONASE) 50 MCG/ACT nasal spray Place 2 sprays into both nostrils daily. (Patient taking differently: Place 2 sprays into both nostrils daily as needed for allergies. )  . furosemide (LASIX) 80 MG tablet Take 1 tablet (80 mg total) by mouth daily.  Marland Kitchen guaiFENesin (MUCINEX) 600 MG 12 hr tablet Take 600 mg by mouth 2 (two) times daily.   Marland Kitchen ipratropium (ATROVENT) 0.02 % nebulizer solution Take 2.5 mLs (0.5 mg total) by nebulization every 6 (six) hours.  Marland Kitchen levalbuterol (XOPENEX) 0.63 MG/3ML nebulizer solution Take 3 mLs (0.63 mg total) by nebulization every 6 (six) hours.  Marland Kitchen levothyroxine (SYNTHROID, LEVOTHROID) 175 MCG tablet TAKE 1 TABLET (175 MCG TOTAL) BY MOUTH DAILY BEFORE BREAKFAST.  Marland Kitchen loratadine-pseudoephedrine (CLARITIN-D 24-HOUR) 10-240 MG per 24 hr tablet Take 1 tablet by mouth as needed for allergies.   . Magnesium Chloride (MAGNESIUM DR PO) Take 1 capsule by mouth daily.   . metFORMIN (GLUCOPHAGE-XR) 500 MG 24 hr tablet TAKE 1 TABLET (500 MG TOTAL) BY MOUTH 4 (FOUR) TIMES DAILY - AFTER MEALS AND AT BEDTIME.  Marland Kitchen OXYGEN-HELIUM IN Inhale 5-6 L into the lungs continuous.   . pantoprazole (PROTONIX) 40 MG tablet Take 1 tablet (40  mg total) by mouth daily at 6 (six) AM. (Patient not taking: Reported on 09/02/2017)  . polyethylene glycol (MIRALAX / GLYCOLAX) packet Take 17 g by mouth 2 (two) times daily.  . pravastatin (PRAVACHOL) 40 MG tablet TAKE 1 TABLET (40 MG TOTAL) BY MOUTH DAILY.  Marland Kitchen warfarin (COUMADIN) 5 MG tablet Take 1-1.5 tablets (5-7.5 mg total) by mouth daily at 6 PM. TAKES 7.5MG  ON TUES, THURS, SUNDAY TAKES 5MG  ALL OTHER DAYS   No current facility-administered medications on file prior to visit.    Allergies  Allergen Reactions  . Inderal [Propranolol] Other (See Comments)    Hair loss  . Ace Inhibitors Cough   PMHx:   Past Medical History:  Diagnosis Date  . Arthritis   . CHF (congestive heart failure) ([place])   . COPD (chronic obstructive pulmonary disease) ([place])   . Depression   . Diabetes mellitus type 2 in  obese ([place])   . GERD (gastroesophageal reflux disease)   . Hyperlipidemia   . Hypertension   . Morbid obesity ([place])   . OAB (overactive bladder)   . PSVT (paroxysmal supraventricular tachycardia) ([place])   . Thyroid disease   . Vitamin D deficiency    Immunization History  Administered Date(s) Administered  . Influenza, High Dose Seasonal PF 08/16/2014, 07/27/2015, 06/06/2016, 08/13/2017  . Pneumococcal Conjugate-13 07/27/2015  . Pneumococcal-Unspecified 05/16/2011  . Tdap 08/16/2008   Past Surgical History:  Procedure Laterality Date  . CARPAL TUNNEL RELEASE     L 1992 R 1984  . COLONOSCOPY N/A 08/19/2017   Procedure: COLONOSCOPY;  Surgeon: Milus Banister, MD;  Location: [facility] ENDOSCOPY;  Service: Endoscopy;  Laterality: N/A;  . EYE SURGERY Bilateral    cataract  . NM MYOVIEW LTD  01/2011   Dobutamine Myoview: Negative perfusion scan for ischemia / infarct (poor image capture); Pt developed SVT with Dobutamine that reproduced her CP as it resolved with restoration of NSR.  Marland Kitchen PUBOVAGINAL SLING    . TEE WITHOUT CARDIOVERSION N/A 12/16/2016   Procedure: TRANSESOPHAGEAL ECHOCARDIOGRAM  (TEE);  Surgeon: Sanda Klein, MD;  Location: [facility] ENDOSCOPY;  Service: Cardiovascular;  Laterality: N/A;  . TONSILLECTOMY AND ADENOIDECTOMY    . TRANSTHORACIC ECHOCARDIOGRAM  01/2011   Hyperdynamic LV with EF 65-70%, Gr 1 DD, Mild Ao Stenosis - mean gradient ~19 mmHg   FHx:    Reviewed / unchanged  SHx:    Reviewed / unchanged   Systems Review:  Constitutional: Denies fever, chills, wt changes, headaches, insomnia, fatigue, night sweats, change in appetite. Eyes: Denies redness, blurred vision, diplopia, discharge, itchy, watery eyes.  ENT: Denies discharge, congestion, post nasal drip, epistaxis, sore throat, earache, hearing loss, dental pain, tinnitus, vertigo, sinus pain, snoring.  CV: Denies chest pain, palpitations, irregular heartbeat, syncope, dyspnea, diaphoresis, orthopnea, PND, claudication or edema. Respiratory: denies pleurisy, hoarseness, laryngitis, but has c/o dry cough, DOE, and intermittent wheezing.  Gastrointestinal: Denies dysphagia, odynophagia, heartburn, reflux, water brash, abdominal pain or cramps, nausea, vomiting, bloating, diarrhea, constipation, hematemesis, melena, hematochezia  or hemorrhoids. Genitourinary: Denies dysuria, frequency, urgency, nocturia, hesitancy, discharge, hematuria or flank pain. Musculoskeletal: Denies arthralgias, myalgias, stiffness, jt. swelling, pain, limping or strain/sprain.  Skin: Denies pruritus, rash, hives, warts, acne, eczema or change in skin lesion(s). Neuro: No weakness, tremor, incoordination, spasms, paresthesia or pain. Psychiatric: Denies confusion, memory loss or sensory loss. Endo: Denies change in weight, skin or hair change.  Heme/Lymph: No excessive bleeding, bruising or enlarged lymph nodes.  Physical Exam  BP 128/78   Pulse 88   Temp (!) 97.5 F (36.4 C)   Resp 20   Ht 5\' 2"  (1.575 m)   Wt 249 lb 6.4 oz (113.1 kg)   BMI 45.62 kg/m  O2 sat is 84 % on 8 lit/min nasal O2. Off Oxygen her O2 sat drops to 70 at  rest.   Appears morbidly obese and in no distress. Spech hoarse.   Eyes: PERRLA, EOMs, conjunctiva no swelling or erythema. Sinuses: No frontal/maxillary tenderness ENT/Mouth: EAC's clear, TM's nl w/o erythema, bulging. Nares clear w/o erythema, swelling, exudates. Oropharynx clear without erythema or exudates. Oral hygiene is good. Tongue normal, non obstructing. Hearing intact.  Neck: Supple. Thyroid nl. Car 2+/2+ without bruits, nodes or JVD. Chest: Respirations nl with BS clear & equal w/o rales, rhonchi, wheezing or stridor.  Cor: Heart sounds soft w/ irregular rate and rhythm without sig. murmurs. Pedal pulses obscured by pedal edema.  Abdomen: Soft, rotund & bowel sounds normal. Non-tender w/o guarding, rebound, hernias, masses or organomegaly.  Lymphatics: Unremarkable.  Musculoskeletal: Full ROM all peripheral extremities, joint stability, 5/5 strength and normal gait.  Skin: Warm, dry without exposed rashes, lesions or ecchymosis apparent.  Neuro: Cranial nerves intact, reflexes equal bilaterally. Sensory-motor testing grossly intact. Tendon reflexes grossly intact.  Pysch: Alert & oriented x 3.  Insight and judgement nl & appropriate. No ideations.  Assessment and Plan:  1. Gastrointestinal hemorrhage, unspecified gastrointestinal hemorrhage type  - CBC with Differential/Platelet - Protime-INR  2. Acute on chronic diastolic CHF (congestive heart failure) ([place])   3. Essential hypertension  - Continue medication, monitor blood pressure at home.   - Continue DASH diet. Reminder to go to the ER if any CP,  SOB, nausea, dizziness, severe HA, changes vision/speech. - CBC with Differential/Platelet - BASIC METABOLIC PANEL WITH GFR  4. Acute on chronic respiratory failure with hypercapnia (HCC)  - BASIC METABOLIC PANEL WITH GFR  5. OSA and COPD overlap syndrome ([place])   6. Atrial fibrillation, chronic ([place])  - Protime-INR  7. Acute renal insufficiency  - BASIC  METABOLIC PANEL WITH GFR  8. Current use of long term anticoagulation  - As above with her very high fall risk and recent life threatening GI bleed and due to poor guarded prognosis, she is no longer deemed a candidate for Coumadin and is recommended to take only 1 LD bASA 81 mg daily.   - Protime-INR  9. Medication management  - CBC with Differential/Platelet - BASIC METABOLIC PANEL WITH GFR         Discussed  regular exercise, BP monitoring, weight control to achieve/maintain BMI less than 25 and discussed meds and SE's. Recommended labs to assess and monitor clinical status with further disposition pending results of labs. Over 30 minutes of exam, counseling, chart review was performed.

## 2017-09-02 NOTE — Patient Instructions (Signed)

## 2017-09-03 LAB — CBC WITH DIFFERENTIAL/PLATELET
BASOS ABS: 42 {cells}/uL (ref 0–200)
Basophils Relative: 0.7 %
EOS ABS: 72 {cells}/uL (ref 15–500)
EOS PCT: 1.2 %
HCT: 36.4 % (ref 35.0–45.0)
Hemoglobin: 10.3 g/dL — ABNORMAL LOW (ref 11.7–15.5)
Lymphs Abs: 990 cells/uL (ref 850–3900)
MCH: 22.9 pg — AB (ref 27.0–33.0)
MCHC: 28.3 g/dL — AB (ref 32.0–36.0)
MCV: 81.1 fL (ref 80.0–100.0)
MONOS PCT: 7.2 %
MPV: 10.2 fL (ref 7.5–12.5)
NEUTROS PCT: 74.4 %
Neutro Abs: 4464 cells/uL (ref 1500–7800)
PLATELETS: 390 10*3/uL (ref 140–400)
RBC: 4.49 10*6/uL (ref 3.80–5.10)
RDW: 16.5 % — AB (ref 11.0–15.0)
TOTAL LYMPHOCYTE: 16.5 %
WBC mixed population: 432 cells/uL (ref 200–950)
WBC: 6 10*3/uL (ref 3.8–10.8)

## 2017-09-03 LAB — BASIC METABOLIC PANEL WITH GFR
BUN / CREAT RATIO: 21 (calc) (ref 6–22)
BUN: 24 mg/dL (ref 7–25)
CHLORIDE: 98 mmol/L (ref 98–110)
CO2: 37 mmol/L — ABNORMAL HIGH (ref 20–32)
Calcium: 9.8 mg/dL (ref 8.6–10.4)
Creat: 1.17 mg/dL — ABNORMAL HIGH (ref 0.60–0.93)
GFR, EST AFRICAN AMERICAN: 54 mL/min/{1.73_m2} — AB (ref >=60)
GFR, Est Non African American: 47 mL/min/{1.73_m2} — ABNORMAL LOW (ref >=60)
GLUCOSE: 120 mg/dL — AB (ref 65–99)
Potassium: 5.4 mmol/L — ABNORMAL HIGH (ref 3.5–5.3)
SODIUM: 143 mmol/L (ref 135–146)

## 2017-09-03 LAB — PROTIME-INR
INR: 2.3 — AB
Prothrombin Time: 24 s — ABNORMAL HIGH (ref 9.0–11.5)

## 2017-09-04 ENCOUNTER — Ambulatory Visit: Payer: PPO | Admitting: Physician Assistant

## 2017-09-10 DIAGNOSIS — J9610 Chronic respiratory failure, unspecified whether with hypoxia or hypercapnia: Secondary | ICD-10-CM | POA: Diagnosis not present

## 2017-09-10 DIAGNOSIS — J449 Chronic obstructive pulmonary disease, unspecified: Secondary | ICD-10-CM | POA: Diagnosis not present

## 2017-09-13 DIAGNOSIS — J449 Chronic obstructive pulmonary disease, unspecified: Secondary | ICD-10-CM | POA: Diagnosis not present

## 2017-09-16 DIAGNOSIS — J9610 Chronic respiratory failure, unspecified whether with hypoxia or hypercapnia: Secondary | ICD-10-CM | POA: Diagnosis not present

## 2017-09-16 DIAGNOSIS — J449 Chronic obstructive pulmonary disease, unspecified: Secondary | ICD-10-CM | POA: Diagnosis not present

## 2017-09-16 NOTE — Progress Notes (Deleted)
FOLLOW UP  Assessment and Plan:   Diagnoses and all orders for this visit:  Typical atrial flutter ([place]) Check INR and will adjust medication according to labs.  Discussed if patient falls to immediately contact office or go to ER. Discussed foods that can increase or decrease Coumadin levels. Patient understands to call the office before starting a new medication. Follow up in one month.   Essential hypertension At goal; continue medications Monitor blood pressure at home; call if consistently over 130/80 Continue DASH diet.   Reminder to go to the ER if any CP, SOB, nausea, dizziness, severe HA, changes vision/speech, left arm numbness and tingling and jaw pain.  COPD with acute exacerbation (HCC) ***  Type 2 diabetes mellitus with stage 3 chronic kidney disease, without long-term current use of insulin ([place]) Recently well controlled with A1Cs in prediabetic range Continue predent diet Perform daily foot/skin check, notify office of any concerning changes.  Check A1C  Hypothyroidism, unspecified type continue medications the same reminded to take on an empty stomach 30-61mins before food.  check TSH level  Pure hypercholesterolemia Currently at goal with diet modification only Continue low cholesterol diet and exercise.  Check lipid panel.   Vitamin D deficiency Continue supplementation for goal 70-100 Check vitamin D level  Anemia due to acute blood loss Check CBC   Continue diet and meds as discussed. Further disposition pending results of labs. Discussed med's effects and SE's.   Over 30 minutes of exam, counseling, chart review, and critical decision making was performed.   Future Appointments  Date Time Provider [place]  09/17/2017  3:30 PM Liane Comber, NP [facility] None  09/19/2017  1:30 PM Lorretta Harp, MD [facility]  10/20/2017  3:30 PM [name], PA-C [facility] None  11/17/2017  3:30 PM Liane Comber, NP [facility] None   12/16/2017  3:00 PM Unk Pinto, MD [facility] None    ----------------------------------------------------------------------------------------------------------------------  HPI 72 y.o. female  With O2 dependent end stage COPD presents for medical management; she is on coumadin for typical a. Flutter, and followed for htn, hyperlipidemia, T2DM with Stage 3 CKD, hypothyroidism, vitamin D deficiency. She is recovering from recent hospitalization with acute GI bleed with consequential anemia requiring transfusion. She is using 8-9L of O2  she currently continues to smoke *** pack a day; discussed risks associated with smoking, patient {ACTION; IS/IS EUM:35361443} ready to quit.   Patient is on Coumadin for Typical atrial flutter ([place]) [I48.3] Patient's last INR is  Lab Results  Component Value Date   INR 2.3 (H) 09/02/2017   INR 1.10 08/20/2017   INR 1.19 08/18/2017    Patient denies SOB, CP, dizziness, nose bleeds, easy bleeding, and blood in stool/urine. Her coumadin dose was not changed last visit. She {HAS HAS NOT:18834} taken ABX, {HAS HAS NOT:18834} missed any doses and {Actions; denies-reports:120008} a fall.     Her blood pressure {HAS HAS NOT:18834} been controlled at home, today their BP is    She {DOES_DOES XVQ:00867} workout. She denies chest pain, shortness of breath, dizziness.   She is not on cholesterol medication. Her cholesterol is at goal. The cholesterol last visit was:   Lab Results  Component Value Date   CHOL 139 05/26/2017   HDL 58 05/26/2017   LDLCALC 80 02/11/2017   TRIG 125 05/26/2017   CHOLHDL 2.4 05/26/2017    She {Has/has not:18111} been working on diet and exercise for T2 diabetes, and denies {Symptoms; diabetes w/o none:19199}. Last A1C in the office  was:  Lab Results  Component Value Date   HGBA1C 5.7 (H) 05/26/2017   She is on thyroid medication. Her medication was not changed last visit.   Lab Results  Component Value Date   TSH 3.887  08/16/2017   Patient is on Vitamin D supplement and was near goal at the last check:   Lab Results  Component Value Date   VD25OH 68 02/11/2017        Current Medications:  Current Outpatient Medications on File Prior to Visit  Medication Sig  . Acetaminophen (TYLENOL 8 HOUR ARTHRITIS PAIN PO) Take 1 tablet by mouth. PRN  . acetaminophen-codeine (TYLENOL #3) 300-30 MG tablet Take 1 tablet by mouth every 8 (eight) hours as needed for moderate pain or severe pain.  Marland Kitchen acetaZOLAMIDE (DIAMOX) 250 MG tablet TAKE 1 TABLET (250 MG TOTAL) BY MOUTH 3 (THREE) TIMES DAILY.  Marland Kitchen albuterol (PROVENTIL HFA;VENTOLIN HFA) 108 (90 Base) MCG/ACT inhaler Inhale 1-2 puffs into the lungs every 6 (six) hours as needed for wheezing or shortness of breath.  Marland Kitchen aspirin 81 MG tablet Take 81 mg by mouth daily.  . bisoprolol (ZEBETA) 10 MG tablet TAKE 1 TABLET (10 MG TOTAL) BY MOUTH DAILY.  . budesonide (PULMICORT) 0.5 MG/2ML nebulizer solution Take 2 mLs (0.5 mg total) by nebulization 2 (two) times daily. (Patient not taking: Reported on 08/12/2017)  . Cholecalciferol (VITAMIN D PO) Take 2,000 Units by mouth daily.   . diazepam (VALIUM) 5 MG tablet Take one tablet up to 2 x a daily for anxiety AS NEEDED  . diltiazem (CARDIZEM CD) 300 MG 24 hr capsule TAKE 1 CAPSULE (300 MG TOTAL) BY MOUTH DAILY.  . DULoxetine (CYMBALTA) 60 MG capsule TAKE 1 CAPSULE BY MOUTH DAILY  . famotidine (PEPCID) 20 MG tablet TAKE 1 TABLET (20 MG TOTAL) BY MOUTH 2 (TWO) TIMES DAILY. FOR ACID REFLUX  . ferrous sulfate 325 (65 FE) MG tablet Take 325 mg by mouth 2 (two) times daily with a meal.  . fluticasone (FLONASE) 50 MCG/ACT nasal spray Place 2 sprays into both nostrils daily. (Patient taking differently: Place 2 sprays into both nostrils daily as needed for allergies. )  . furosemide (LASIX) 80 MG tablet Take 1 tablet (80 mg total) by mouth daily.  Marland Kitchen guaiFENesin (MUCINEX) 600 MG 12 hr tablet Take 600 mg by mouth 2 (two) times daily.   Marland Kitchen  ipratropium (ATROVENT) 0.02 % nebulizer solution Take 2.5 mLs (0.5 mg total) by nebulization every 6 (six) hours.  Marland Kitchen levalbuterol (XOPENEX) 0.63 MG/3ML nebulizer solution Take 3 mLs (0.63 mg total) by nebulization every 6 (six) hours.  Marland Kitchen levothyroxine (SYNTHROID, LEVOTHROID) 175 MCG tablet TAKE 1 TABLET (175 MCG TOTAL) BY MOUTH DAILY BEFORE BREAKFAST.  Marland Kitchen loratadine-pseudoephedrine (CLARITIN-D 24-HOUR) 10-240 MG per 24 hr tablet Take 1 tablet by mouth as needed for allergies.   . Magnesium Chloride (MAGNESIUM DR PO) Take 1 capsule by mouth daily.   . metFORMIN (GLUCOPHAGE-XR) 500 MG 24 hr tablet TAKE 1 TABLET (500 MG TOTAL) BY MOUTH 4 (FOUR) TIMES DAILY - AFTER MEALS AND AT BEDTIME.  Marland Kitchen OXYGEN-HELIUM IN Inhale 5-6 L into the lungs continuous.   . pantoprazole (PROTONIX) 40 MG tablet Take 1 tablet (40 mg total) by mouth daily at 6 (six) AM. (Patient not taking: Reported on 09/02/2017)  . polyethylene glycol (MIRALAX / GLYCOLAX) packet Take 17 g by mouth 2 (two) times daily.  . pravastatin (PRAVACHOL) 40 MG tablet TAKE 1 TABLET (40 MG TOTAL) BY MOUTH DAILY.  Marland Kitchen  warfarin (COUMADIN) 5 MG tablet Take 1-1.5 tablets (5-7.5 mg total) by mouth daily at 6 PM. TAKES 7.5MG  ON TUES, THURS, SUNDAY TAKES 5MG  ALL OTHER DAYS   No current facility-administered medications on file prior to visit.      Allergies:  Allergies  Allergen Reactions  . Inderal [Propranolol] Other (See Comments)    Hair loss  . Ace Inhibitors Cough     Medical History:  Past Medical History:  Diagnosis Date  . Arthritis   . CHF (congestive heart failure) ([place])   . COPD (chronic obstructive pulmonary disease) ([place])   . Depression   . Diabetes mellitus type 2 in obese ([place])   . GERD (gastroesophageal reflux disease)   . Hyperlipidemia   . Hypertension   . Morbid obesity ([place])   . OAB (overactive bladder)   . PSVT (paroxysmal supraventricular tachycardia) ([place])   . Thyroid disease   . Vitamin D deficiency    Family history-  Reviewed and unchanged Social history- Reviewed and unchanged   Review of Systems:  ROS    Physical Exam: There were no vitals taken for this visit. Wt Readings from Last 3 Encounters:  09/02/17 249 lb 6.4 oz (113.1 kg)  08/20/17 239 lb 3.2 oz (108.5 kg)  08/12/17 253 lb (114.8 kg)   General Appearance: Well nourished, in no apparent distress. Eyes: PERRLA, EOMs, conjunctiva no swelling or erythema Sinuses: No Frontal/maxillary tenderness ENT/Mouth: Ext aud canals clear, TMs without erythema, bulging. No erythema, swelling, or exudate on post pharynx.  Tonsils not swollen or erythematous. Hearing normal.  Neck: Supple, thyroid normal.  Respiratory: Respiratory effort normal, BS equal bilaterally without rales, rhonchi, wheezing or stridor.  Cardio: RRR with no MRGs. Brisk peripheral pulses without edema.  Abdomen: Soft, + BS.  Non tender, no guarding, rebound, hernias, masses. Lymphatics: Non tender without lymphadenopathy.  Musculoskeletal: Full ROM, 5/5 strength, {PSY - GAIT AND STATION:22860} gait Skin: Warm, dry without rashes, lesions, ecchymosis.  Neuro: Cranial nerves intact. No cerebellar symptoms.  Psych: Awake and oriented X 3, normal affect, Insight and Judgment appropriate.    Izora Ribas, NP 5:40 PM [facility]

## 2017-09-17 ENCOUNTER — Ambulatory Visit: Payer: Self-pay | Admitting: Adult Health

## 2017-09-18 DIAGNOSIS — J449 Chronic obstructive pulmonary disease, unspecified: Secondary | ICD-10-CM | POA: Diagnosis not present

## 2017-09-19 ENCOUNTER — Ambulatory Visit: Payer: PPO | Admitting: Cardiovascular Disease

## 2017-09-22 DIAGNOSIS — N183 Chronic kidney disease, stage 3 unspecified: Secondary | ICD-10-CM | POA: Insufficient documentation

## 2017-09-22 NOTE — Progress Notes (Deleted)
MEDICARE ANNUAL WELLNESS VISIT AND FOLLOW UP  Assessment:   Diagnoses and all orders for this visit:  Medicare annual wellness visit, subsequent  CKD stage 3 due to type 2 diabetes mellitus (HCC) Increase fluids, avoid NSAIDS, monitor sugars, will monitor -     BASIC METABOLIC PANEL WITH GFR -     Microalbumin / creatinine urine ratio  Essential hypertension At goal; continue medication Monitor blood pressure at home; call if consistently over 130/80 Continue DASH diet.   Reminder to go to the ER if any CP, SOB, nausea, dizziness, severe HA, changes vision/speech, left arm numbness and tingling and jaw pain. -     Magnesium  Paroxysmal SVT (supraventricular tachycardia) (HCC)      Continue follow up with cardiology  Atrial flutter with rapid ventricular response (HCC)       Check PT/INR       Continue follow up with cardiology  Dilated cardiomyopathy [facility])       Continue follow up with cardiology  Pulmonary hypertension ([place])        Continue follow up with pulmonology  Chronic diastolic congestive heart failure ([place]) Disease process and medications discussed. Questions answered fully. Emphasized salt restriction, less than 2000mg  a day. Encouraged daily monitoring of the patient's weight, call office if 5 lb weight loss or gain in a day.  Encouraged regular exercise. If any increasing shortness of breath, swelling, or chest pressure go to ER immediately.  decrease your fluid intake to less than 2 L daily please remember to always increase your potassium intake with any increase of your fluid pill.   Typical atrial flutter (HCC) -     Protime-INR  Aortic atherosclerosis (HCC) Control blood pressure, cholesterol, glucose, increase exercise.   Abnormality of mitral valve annulus Continue follow up with cardiology  Chronic obstructive pulmonary disease, unspecified COPD type ([place]) End stage; O2 dependent Continue daily respiratory medications Follow up  pulmonology Continue close follow up Recommended she stop smoking  OSA (obstructive sleep apnea) Continue BiPAP  Gastroesophageal reflux disease without esophagitis Well managed on current medications; continue PPI secondary to end stage disease and recent GI bleeding requiring transfusion Discussed diet, avoiding triggers and other lifestyle changes -     Magnesium  Hypothyroidism, unspecified type continue medications the same reminded to take on an empty stomach 30-50mins before food.  check TSH level  Diabetes mellitus type 2 in obese [facility]) Well controlled; continue medications: metformin Continue diet Reminded to get annual diabetic eye exam  Perform daily foot/skin check, notify office of any concerning changes.  Foot exam performed -     Hemoglobin A1c  Morbid obesity (HCC) Recommended diet heavy in fruits and veggies and low in animal meats, cheeses, and dairy products, appropriate calorie intake Discussed appropriate weight for height  Follow up 1 month  Iron deficiency anemia, unspecified iron deficiency anemia type       ? Related to GI bleed; continue iron supplement, monitor -     CBC with Differential/Platelet  History of colonic polyps       Likely can no longer tolerate colonoscopies secondary to end stage respiratory disease  Pure hypercholesterolemia At goal; continue statin Continue low cholesterol diet and exercise. -     Lipid panel -     TSH  Vitamin D deficiency Continue supplementation for goal of 70-100 -     VITAMIN D 25 Hydroxy (Vit-D Deficiency, Fractures)  Medication management -     CBC with Differential/Platelet -  BASIC METABOLIC PANEL WITH GFR -     Hepatic function panel  Tobacco abuse Smoking cessation-  instruction/counseling given, counseled patient on the dangers of tobacco use, advised patient to stop smoking, and reviewed strategies to maximize success, patient not ready to quit at this time.   Anemia due to acute blood  loss CBC  DNR (do not resuscitate) Reviewed  Over 40 minutes of exam, counseling, chart review and critical decision making was performed Future Appointments  Date Time Provider [place]  09/23/2017  3:00 PM Liane Comber, NP [facility] None  10/20/2017  3:30 PM [name], PA-C [facility] None  11/17/2017  3:30 PM Liane Comber, NP [facility] None  12/16/2017  3:00 PM Unk Pinto, MD [facility] None     Plan:   During the course of the visit the patient was educated and counseled about appropriate screening and preventive services including:    Pneumococcal vaccine   Prevnar 13  Influenza vaccine  Td vaccine  Screening electrocardiogram  Bone densitometry screening  Colorectal cancer screening  Diabetes screening  Glaucoma screening  Nutrition counseling   Advanced directives: requested   Subjective:  Jillian Wood is a 72 y.o. female HTN, HLD, ASHD/pAfib, ES O2 dependent COPD/OSA/CPAP, Morbid Obesity (BMI 41+), T2_DM, Hypothyroidism, GERD and Vitamin D Deficiency who presents for Medicare Annual Wellness Visit and 1 month follow up for coumadin management.    She has end stage COPD dependent on *** L of O2 with   Patient is on Coumadin for Typical Atrial flutter.  Patient's last INR is  Lab Results  Component Value Date   INR 2.3 (H) 09/02/2017   INR 1.10 08/20/2017   INR 1.19 08/18/2017    Patient denies SOB, CP, dizziness, nose bleeds, easy bleeding, and blood in stool/urine. Her coumadin dose {WAS/WAS NOT:[phone]::"was not"} changed last visit. She {HAS HAS NOT:18834} taken ABX, {HAS HAS NOT:18834} missed any doses and {Actions; denies-reports:120008} a fall.    She has a history of Diastolic CHF, denies {LGXQJJHERDE:08144::"YJEHUDJ on exertion","orthopnea","paroxysmal nocturnal dyspnea","edema"}. Positive for {CARDIAC SYMPTOMS:12860}. Wt Readings from Last 3 Encounters:  09/02/17 249 lb 6.4 oz (113.1 kg)  08/20/17 239 lb 3.2  oz (108.5 kg)  08/12/17 253 lb (114.8 kg)   She is on cholesterol medication and denies myalgias. Her cholesterol is at goal. The cholesterol last visit was:   Lab Results  Component Value Date   CHOL 139 05/26/2017   HDL 58 05/26/2017   LDLCALC 80 02/11/2017   TRIG 125 05/26/2017   CHOLHDL 2.4 05/26/2017    She {Has/has not:18111} been working on diet and exercise for T2 diabetes most , and denies {Symptoms; diabetes w/o none:19199}. Last A1C in the office was:  Lab Results  Component Value Date   HGBA1C 5.7 (H) 05/26/2017   Last GFR: Lab Results  Component Value Date   GFRNONAA 47 (L) 09/02/2017   Patient is on Vitamin D supplement and near goal at last check:    Lab Results  Component Value Date   VD25OH 68 02/11/2017      Medication Review: Current Outpatient Medications on File Prior to Visit  Medication Sig Dispense Refill  . Acetaminophen (TYLENOL 8 HOUR ARTHRITIS PAIN PO) Take 1 tablet by mouth. PRN    . acetaminophen-codeine (TYLENOL #3) 300-30 MG tablet Take 1 tablet by mouth every 8 (eight) hours as needed for moderate pain or severe pain. 90 tablet 0  . acetaZOLAMIDE (DIAMOX) 250 MG tablet TAKE 1 TABLET (250 MG TOTAL) BY  MOUTH 3 (THREE) TIMES DAILY. 270 tablet 1  . albuterol (PROVENTIL HFA;VENTOLIN HFA) 108 (90 Base) MCG/ACT inhaler Inhale 1-2 puffs into the lungs every 6 (six) hours as needed for wheezing or shortness of breath. 18 g 2  . aspirin 81 MG tablet Take 81 mg by mouth daily.    . bisoprolol (ZEBETA) 10 MG tablet TAKE 1 TABLET (10 MG TOTAL) BY MOUTH DAILY. 90 tablet 1  . budesonide (PULMICORT) 0.5 MG/2ML nebulizer solution Take 2 mLs (0.5 mg total) by nebulization 2 (two) times daily. (Patient not taking: Reported on 08/12/2017) 360 mL 3  . Cholecalciferol (VITAMIN D PO) Take 2,000 Units by mouth daily.     . diazepam (VALIUM) 5 MG tablet Take one tablet up to 2 x a daily for anxiety AS NEEDED 60 tablet 0  . diltiazem (CARDIZEM CD) 300 MG 24 hr capsule  TAKE 1 CAPSULE (300 MG TOTAL) BY MOUTH DAILY. 90 capsule 1  . DULoxetine (CYMBALTA) 60 MG capsule TAKE 1 CAPSULE BY MOUTH DAILY 90 capsule 1  . famotidine (PEPCID) 20 MG tablet TAKE 1 TABLET (20 MG TOTAL) BY MOUTH 2 (TWO) TIMES DAILY. FOR ACID REFLUX 180 tablet 1  . ferrous sulfate 325 (65 FE) MG tablet Take 325 mg by mouth 2 (two) times daily with a meal.    . fluticasone (FLONASE) 50 MCG/ACT nasal spray Place 2 sprays into both nostrils daily. (Patient taking differently: Place 2 sprays into both nostrils daily as needed for allergies. ) 16 g 0  . furosemide (LASIX) 80 MG tablet Take 1 tablet (80 mg total) by mouth daily. 90 tablet 0  . guaiFENesin (MUCINEX) 600 MG 12 hr tablet Take 600 mg by mouth 2 (two) times daily.     Marland Kitchen ipratropium (ATROVENT) 0.02 % nebulizer solution Take 2.5 mLs (0.5 mg total) by nebulization every 6 (six) hours. 75 mL 12  . levalbuterol (XOPENEX) 0.63 MG/3ML nebulizer solution Take 3 mLs (0.63 mg total) by nebulization every 6 (six) hours. 3 mL 12  . levothyroxine (SYNTHROID, LEVOTHROID) 175 MCG tablet TAKE 1 TABLET (175 MCG TOTAL) BY MOUTH DAILY BEFORE BREAKFAST. 90 tablet 1  . loratadine-pseudoephedrine (CLARITIN-D 24-HOUR) 10-240 MG per 24 hr tablet Take 1 tablet by mouth as needed for allergies.     . Magnesium Chloride (MAGNESIUM DR PO) Take 1 capsule by mouth daily.     . metFORMIN (GLUCOPHAGE-XR) 500 MG 24 hr tablet TAKE 1 TABLET (500 MG TOTAL) BY MOUTH 4 (FOUR) TIMES DAILY - AFTER MEALS AND AT BEDTIME. 360 tablet 1  . OXYGEN-HELIUM IN Inhale 5-6 L into the lungs continuous.     . pantoprazole (PROTONIX) 40 MG tablet Take 1 tablet (40 mg total) by mouth daily at 6 (six) AM. (Patient not taking: Reported on 09/02/2017) 30 tablet 0  . polyethylene glycol (MIRALAX / GLYCOLAX) packet Take 17 g by mouth 2 (two) times daily. 14 each 0  . pravastatin (PRAVACHOL) 40 MG tablet TAKE 1 TABLET (40 MG TOTAL) BY MOUTH DAILY. 90 tablet 1  . warfarin (COUMADIN) 5 MG tablet Take  1-1.5 tablets (5-7.5 mg total) by mouth daily at 6 PM. TAKES 7.5MG  ON TUES, THURS, SUNDAY TAKES 5MG  ALL OTHER DAYS 30 tablet 2   No current facility-administered medications on file prior to visit.     Allergies  Allergen Reactions  . Inderal [Propranolol] Other (See Comments)    Hair loss  . Ace Inhibitors Cough    Current Problems (verified) Patient Active Problem List  Diagnosis Date Noted  . CKD stage 3 due to type 2 diabetes mellitus ([place]) 09/22/2017  . OSA (obstructive sleep apnea) 08/14/2017  . Anemia 08/14/2017  . DNR (do not resuscitate) 08/14/2017  . Abnormality of mitral valve annulus   . Aortic atherosclerosis ([place]) 12/09/2016  . Typical atrial flutter ([place])   . CHF (congestive heart failure) ([place]) 11/11/2016  . Dilated cardiomyopathy ([place])   . Pulmonary hypertension ([place])   . Diabetes mellitus type 2 in obese ([place])   . COPD (chronic obstructive pulmonary disease) ([place]) 07/31/2016  . Atrial flutter with rapid ventricular response ([place]) 07/31/2016  . GERD (gastroesophageal reflux disease) 07/31/2015  . Tobacco abuse 10/18/2014  . Paroxysmal SVT (supraventricular tachycardia) ([place]) 08/04/2014  . Hypothyroidism 02/22/2014  . Medication management 11/06/2013  . Essential hypertension   . Hyperlipidemia   . Morbid obesity ([place])   . Vitamin D deficiency   . Iron deficiency anemia 07/11/2010  . History of colonic polyps 07/11/2010    Screening Tests Immunization History  Administered Date(s) Administered  . Influenza, High Dose Seasonal PF 08/16/2014, 07/27/2015, 06/06/2016, 08/13/2017  . Pneumococcal Conjugate-13 07/27/2015  . Pneumococcal-Unspecified 05/16/2011  . Tdap 08/16/2008   Preventative care: Last colonoscopy: 2011 Last mammogram:  2015*** DEXA:2015  Prior vaccinations: TD or Tdap: 2009  Influenza: 2018  Pneumococcal: 2015 Prevnar13: 2016 Shingles/Zostavax: Declined   Names of Other Physician/Practitioners you currently use: 1. [facility]- here for primary care 2. Has not seen, eye doctor, last visit 2016 3. Doesn't see, dentist, last visit remote, dentures  Patient Care Team: Unk Pinto, MD as PCP - General (Internal Medicine) Lorretta Harp, MD as PCP - Cardiology (Cardiology) Evans Lance, MD as Consulting Physician (Cardiology) Lorretta Harp, MD as Consulting Physician (Cardiology) Inda Castle, MD (Inactive) as Consulting Physician (Gastroenterology) Paulla Dolly Tamala Fothergill, DPM as Consulting Physician (Podiatry)  SURGICAL HISTORY She  has a past surgical history that includes Carpal tunnel release; Eye surgery (Bilateral); Pubovaginal sling; Tonsillectomy and adenoidectomy; transthoracic echocardiogram (01/2011); NM MYOVIEW LTD (01/2011); TEE without cardioversion (N/A, 12/16/2016); and Colonoscopy (N/A, 08/19/2017). FAMILY HISTORY Her family history includes Alcohol abuse in her father; Heart disease in her father. SOCIAL HISTORY She  reports that she quit smoking about 13 months ago. Her smoking use included cigarettes. She has a 22.00 pack-year smoking history. she has never used smokeless tobacco. She reports that she does not drink alcohol or use drugs.   MEDICARE WELLNESS OBJECTIVES: Physical activity:   Cardiac risk factors:   Depression/mood screen:   Depression screen [facility] 2/9 09/02/2017  Decreased Interest 1  Down, Depressed, Hopeless 1  PHQ - 2 Score 2  Altered sleeping 1  Tired, decreased energy 2  Feeling bad or failure about yourself  0  Trouble concentrating 0  PHQ-9 Score 5  Some recent data might be hidden    ADLs:  In your present state of health, do you have any difficulty performing the following activities: 09/02/2017 09/02/2017  Hearing? N N  Vision? N N  Difficulty concentrating or making decisions? N N  Walking or climbing stairs? N N  Dressing or bathing? N N  Doing errands, shopping? N N  Some recent data might be hidden      Cognitive Testing  Alert? Yes  Normal Appearance?Yes  Oriented to person? Yes  Place? Yes   Time? Yes  Recall of three objects?  Yes  Can perform simple calculations? Yes  Displays appropriate judgment?Yes  Can  read the correct time from a watch face?Yes  EOL planning:    ROS   Objective:     There were no vitals filed for this visit. There is no height or weight on file to calculate BMI.  General appearance: alert, no distress, WD/WN, female HEENT: normocephalic, sclerae anicteric, TMs pearly, nares patent, no discharge or erythema, pharynx normal Oral cavity: MMM, no lesions Neck: supple, no lymphadenopathy, no thyromegaly, no masses Heart: RRR, normal S1, S2, no murmurs Lungs: CTA bilaterally, no wheezes, rhonchi, or rales Abdomen: +bs, soft, non tender, non distended, no masses, no hepatomegaly, no splenomegaly Musculoskeletal: nontender, no swelling, no obvious deformity Extremities: no edema, no cyanosis, no clubbing Pulses: 2+ symmetric, upper and lower extremities, normal cap refill Neurological: alert, oriented x 3, CN2-12 intact, strength normal upper extremities and lower extremities, sensation normal throughout, DTRs 2+ throughout, no cerebellar signs, gait normal Psychiatric: normal affect, behavior normal, pleasant   Medicare Attestation I have personally reviewed: The patient's medical and social history Their use of alcohol, tobacco or illicit drugs Their current medications and supplements The patient's functional ability including ADLs,fall risks, home safety risks, cognitive, and hearing and visual impairment Diet and physical activities Evidence for depression or mood disorders  The patient's weight, height, BMI, and visual acuity have been recorded in the chart.  I have made referrals, counseling, and provided education to the patient based on review of the above and I have provided the patient with a written personalized care plan for preventive  services.     Izora Ribas, NP   09/22/2017

## 2017-09-23 ENCOUNTER — Ambulatory Visit: Payer: Self-pay | Admitting: Adult Health

## 2017-09-23 ENCOUNTER — Other Ambulatory Visit: Payer: Self-pay | Admitting: *Deleted

## 2017-09-23 MED ORDER — LEVALBUTEROL HCL 0.63 MG/3ML IN NEBU: 0.6300 mg | INHALATION_SOLUTION | Freq: Four times a day (QID) | RESPIRATORY_TRACT | 11 refills | Status: DC

## 2017-09-24 DIAGNOSIS — J449 Chronic obstructive pulmonary disease, unspecified: Secondary | ICD-10-CM | POA: Diagnosis not present

## 2017-09-28 NOTE — Progress Notes (Deleted)
MEDICARE ANNUAL WELLNESS VISIT AND FOLLOW UP  Assessment:   Diagnoses and all orders for this visit:  Medicare annual wellness visit, subsequent  CKD stage 3 ([place]) Increase fluids, avoid NSAIDS, monitor sugars, will monitor -     BASIC METABOLIC PANEL WITH GFR -     Microalbumin / creatinine urine ratio  Essential hypertension At goal; continue medication Monitor blood pressure at home; call if consistently over 130/80 Continue DASH diet.   Reminder to go to the ER if any CP, SOB, nausea, dizziness, severe HA, changes vision/speech, left arm numbness and tingling and jaw pain. -     Magnesium  Paroxysmal SVT (supraventricular tachycardia) (HCC)      Continue follow up with cardiology  Atrial flutter with rapid ventricular response (HCC)       Check PT/INR       Continue follow up with cardiology  Dilated cardiomyopathy [facility])       Continue follow up with cardiology  Pulmonary hypertension ([place])        Continue follow up with pulmonology  Chronic diastolic congestive heart failure ([place]) Disease process and medications discussed. Questions answered fully. Emphasized salt restriction, less than 2000mg  a day. Encouraged daily monitoring of the patient's weight, call office if 5 lb weight loss or gain in a day.  Encouraged regular exercise. If any increasing shortness of breath, swelling, or chest pressure go to ER immediately.  decrease your fluid intake to less than 2 L daily please remember to always increase your potassium intake with any increase of your fluid pill.   Typical atrial flutter (HCC) -     Protime-INR  Aortic atherosclerosis (HCC) Control blood pressure, cholesterol, glucose, increase exercise.   Abnormality of mitral valve annulus Continue follow up with cardiology  Chronic obstructive pulmonary disease, unspecified COPD type ([place]) End stage; O2 dependent Continue daily respiratory medications Follow up pulmonology Continue close follow  up Recommended she stop smoking  OSA (obstructive sleep apnea) Continue BiPAP  Gastroesophageal reflux disease without esophagitis Well managed on current medications; continue PPI secondary to end stage disease and recent GI bleeding requiring transfusion Discussed diet, avoiding triggers and other lifestyle changes -     Magnesium  Hypothyroidism, unspecified type continue medications the same reminded to take on an empty stomach 30-106mins before food.  check TSH level  Diabetes mellitus type 2 in obese [facility]) Well controlled; continue medications: metformin Continue diet Reminded to get annual diabetic eye exam  Perform daily foot/skin check, notify office of any concerning changes.  Foot exam performed -     Hemoglobin A1c  Morbid obesity (HCC) Recommended diet heavy in fruits and veggies and low in animal meats, cheeses, and dairy products, appropriate calorie intake Discussed appropriate weight for height  Follow up 1 month  Iron deficiency anemia, unspecified iron deficiency anemia type       ? Related to GI bleed; continue iron supplement, monitor -     CBC with Differential/Platelet  History of colonic polyps       Likely can no longer tolerate colonoscopies secondary to end stage respiratory disease  Pure hypercholesterolemia At goal; continue statin Continue low cholesterol diet and exercise. -     Lipid panel -     TSH  Vitamin D deficiency Continue supplementation for goal of 70-100 -     VITAMIN D 25 Hydroxy (Vit-D Deficiency, Fractures)  Medication management -     CBC with Differential/Platelet -     BASIC METABOLIC PANEL WITH  GFR -     Hepatic function panel  Tobacco abuse Smoking cessation-  instruction/counseling given, counseled patient on the dangers of tobacco use, advised patient to stop smoking, and reviewed strategies to maximize success, patient not ready to quit at this time.   Anemia due to acute blood loss CBC  DNR (do not  resuscitate) Reviewed  Over 40 minutes of exam, counseling, chart review and critical decision making was performed Future Appointments  Date Time Provider [place]  09/29/2017  3:45 PM Liane Comber, NP [facility] None  10/20/2017  3:30 PM [name], PA-C [facility] None  11/17/2017  3:30 PM Liane Comber, NP [facility] None  12/16/2017  3:00 PM Unk Pinto, MD [facility] None     Plan:   During the course of the visit the patient was educated and counseled about appropriate screening and preventive services including:    Pneumococcal vaccine   Prevnar 13  Influenza vaccine  Td vaccine  Screening electrocardiogram  Bone densitometry screening  Colorectal cancer screening  Diabetes screening  Glaucoma screening  Nutrition counseling   Advanced directives: requested   Subjective:  Jillian Wood is a 72 y.o. female HTN, HLD, ASHD/pAfib, ES O2 dependent COPD/OSA/CPAP, Morbid Obesity (BMI 41+), T2_DM, Hypothyroidism, GERD and Vitamin D Deficiency who presents for Medicare Annual Wellness Visit and 1 month follow up for coumadin management.    She has end stage COPD dependent on *** L of O2 with   Patient is on Coumadin for Typical Atrial flutter.  Patient's last INR is  Lab Results  Component Value Date   INR 2.3 (H) 09/02/2017   INR 1.10 08/20/2017   INR 1.19 08/18/2017    Patient denies SOB, CP, dizziness, nose bleeds, easy bleeding, and blood in stool/urine. Her coumadin dose {WAS/WAS NOT:[phone]::"was not"} changed last visit. She {HAS HAS NOT:18834} taken ABX, {HAS HAS NOT:18834} missed any doses and {Actions; denies-reports:120008} a fall.    She has a history of Diastolic CHF, denies {IWLNLGXQJJH:41740::"CXKGYJE on exertion","orthopnea","paroxysmal nocturnal dyspnea","edema"}. Positive for {CARDIAC SYMPTOMS:12860}. Wt Readings from Last 3 Encounters:  09/02/17 249 lb 6.4 oz (113.1 kg)  08/20/17 239 lb 3.2 oz (108.5 kg)  08/12/17  253 lb (114.8 kg)   She is on cholesterol medication and denies myalgias. Her cholesterol is at goal. The cholesterol last visit was:   Lab Results  Component Value Date   CHOL 139 05/26/2017   HDL 58 05/26/2017   LDLCALC 80 02/11/2017   TRIG 125 05/26/2017   CHOLHDL 2.4 05/26/2017    She {Has/has not:18111} been working on diet and exercise for prediabetes recently well controlled, and denies {Symptoms; diabetes w/o none:19199}. Last A1C in the office was:  Lab Results  Component Value Date   HGBA1C 5.7 (H) 05/26/2017   Last GFR: Lab Results  Component Value Date   GFRNONAA 47 (L) 09/02/2017   Patient is on Vitamin D supplement and near goal at last check:    Lab Results  Component Value Date   VD25OH 68 02/11/2017      Medication Review: Current Outpatient Medications on File Prior to Visit  Medication Sig Dispense Refill  . Acetaminophen (TYLENOL 8 HOUR ARTHRITIS PAIN PO) Take 1 tablet by mouth. PRN    . acetaminophen-codeine (TYLENOL #3) 300-30 MG tablet Take 1 tablet by mouth every 8 (eight) hours as needed for moderate pain or severe pain. 90 tablet 0  . acetaZOLAMIDE (DIAMOX) 250 MG tablet TAKE 1 TABLET (250 MG TOTAL) BY MOUTH 3 (THREE) TIMES  DAILY. 270 tablet 1  . albuterol (PROVENTIL HFA;VENTOLIN HFA) 108 (90 Base) MCG/ACT inhaler Inhale 1-2 puffs into the lungs every 6 (six) hours as needed for wheezing or shortness of breath. 18 g 2  . aspirin 81 MG tablet Take 81 mg by mouth daily.    . bisoprolol (ZEBETA) 10 MG tablet TAKE 1 TABLET (10 MG TOTAL) BY MOUTH DAILY. 90 tablet 1  . budesonide (PULMICORT) 0.5 MG/2ML nebulizer solution Take 2 mLs (0.5 mg total) by nebulization 2 (two) times daily. (Patient not taking: Reported on 08/12/2017) 360 mL 3  . Cholecalciferol (VITAMIN D PO) Take 2,000 Units by mouth daily.     . diazepam (VALIUM) 5 MG tablet Take one tablet up to 2 x a daily for anxiety AS NEEDED 60 tablet 0  . diltiazem (CARDIZEM CD) 300 MG 24 hr capsule TAKE 1  CAPSULE (300 MG TOTAL) BY MOUTH DAILY. 90 capsule 1  . DULoxetine (CYMBALTA) 60 MG capsule TAKE 1 CAPSULE BY MOUTH DAILY 90 capsule 1  . famotidine (PEPCID) 20 MG tablet TAKE 1 TABLET (20 MG TOTAL) BY MOUTH 2 (TWO) TIMES DAILY. FOR ACID REFLUX 180 tablet 1  . ferrous sulfate 325 (65 FE) MG tablet Take 325 mg by mouth 2 (two) times daily with a meal.    . fluticasone (FLONASE) 50 MCG/ACT nasal spray Place 2 sprays into both nostrils daily. (Patient taking differently: Place 2 sprays into both nostrils daily as needed for allergies. ) 16 g 0  . furosemide (LASIX) 80 MG tablet Take 1 tablet (80 mg total) by mouth daily. 90 tablet 0  . guaiFENesin (MUCINEX) 600 MG 12 hr tablet Take 600 mg by mouth 2 (two) times daily.     Marland Kitchen ipratropium (ATROVENT) 0.02 % nebulizer solution Take 2.5 mLs (0.5 mg total) by nebulization every 6 (six) hours. 75 mL 12  . levalbuterol (XOPENEX) 0.63 MG/3ML nebulizer solution Take 3 mLs (0.63 mg total) by nebulization every 6 (six) hours. 360 mL 11  . levothyroxine (SYNTHROID, LEVOTHROID) 175 MCG tablet TAKE 1 TABLET (175 MCG TOTAL) BY MOUTH DAILY BEFORE BREAKFAST. 90 tablet 1  . loratadine-pseudoephedrine (CLARITIN-D 24-HOUR) 10-240 MG per 24 hr tablet Take 1 tablet by mouth as needed for allergies.     . Magnesium Chloride (MAGNESIUM DR PO) Take 1 capsule by mouth daily.     . metFORMIN (GLUCOPHAGE-XR) 500 MG 24 hr tablet TAKE 1 TABLET (500 MG TOTAL) BY MOUTH 4 (FOUR) TIMES DAILY - AFTER MEALS AND AT BEDTIME. 360 tablet 1  . OXYGEN-HELIUM IN Inhale 5-6 L into the lungs continuous.     . pantoprazole (PROTONIX) 40 MG tablet Take 1 tablet (40 mg total) by mouth daily at 6 (six) AM. (Patient not taking: Reported on 09/02/2017) 30 tablet 0  . polyethylene glycol (MIRALAX / GLYCOLAX) packet Take 17 g by mouth 2 (two) times daily. 14 each 0  . pravastatin (PRAVACHOL) 40 MG tablet TAKE 1 TABLET (40 MG TOTAL) BY MOUTH DAILY. 90 tablet 1  . warfarin (COUMADIN) 5 MG tablet Take 1-1.5  tablets (5-7.5 mg total) by mouth daily at 6 PM. TAKES 7.5MG  ON TUES, THURS, SUNDAY TAKES 5MG  ALL OTHER DAYS 30 tablet 2   No current facility-administered medications on file prior to visit.     Allergies  Allergen Reactions  . Inderal [Propranolol] Other (See Comments)    Hair loss  . Ace Inhibitors Cough    Current Problems (verified) Patient Active Problem List   Diagnosis Date Noted  .  CKD stage 3 due to type 2 diabetes mellitus ([place]) 09/22/2017  . OSA (obstructive sleep apnea) 08/14/2017  . Anemia 08/14/2017  . DNR (do not resuscitate) 08/14/2017  . Abnormality of mitral valve annulus   . Aortic atherosclerosis ([place]) 12/09/2016  . Typical atrial flutter ([place])   . CHF (congestive heart failure) ([place]) 11/11/2016  . Dilated cardiomyopathy ([place])   . Pulmonary hypertension ([place])   . Diabetes mellitus type 2 in obese ([place])   . COPD (chronic obstructive pulmonary disease) ([place]) 07/31/2016  . Atrial flutter with rapid ventricular response ([place]) 07/31/2016  . GERD (gastroesophageal reflux disease) 07/31/2015  . Tobacco abuse 10/18/2014  . Paroxysmal SVT (supraventricular tachycardia) ([place]) 08/04/2014  . Hypothyroidism 02/22/2014  . Medication management 11/06/2013  . Essential hypertension   . Hyperlipidemia   . Morbid obesity ([place])   . Vitamin D deficiency   . Iron deficiency anemia 07/11/2010  . History of colonic polyps 07/11/2010    Screening Tests Immunization History  Administered Date(s) Administered  . Influenza, High Dose Seasonal PF 08/16/2014, 07/27/2015, 06/06/2016, 08/13/2017  . Pneumococcal Conjugate-13 07/27/2015  . Pneumococcal-Unspecified 05/16/2011  . Tdap 08/16/2008   Preventative care: Last colonoscopy: 2011 Last mammogram:  2015*** DEXA:2015  Prior vaccinations: TD or Tdap: 2009  Influenza: 2018  Pneumococcal: 2015 Prevnar13: 2016 Shingles/Zostavax: Declined   Names of Other Physician/Practitioners you currently use: 1. [facility]- here for primary care 2. Has not seen, eye doctor, last visit 2016 3. Doesn't see, dentist, last visit remote, dentures  Patient Care Team: Unk Pinto, MD as PCP - General (Internal Medicine) Lorretta Harp, MD as PCP - Cardiology (Cardiology) Evans Lance, MD as Consulting Physician (Cardiology) Lorretta Harp, MD as Consulting Physician (Cardiology) Inda Castle, MD (Inactive) as Consulting Physician (Gastroenterology) Paulla Dolly Tamala Fothergill, DPM as Consulting Physician (Podiatry)  SURGICAL HISTORY She  has a past surgical history that includes Carpal tunnel release; Eye surgery (Bilateral); Pubovaginal sling; Tonsillectomy and adenoidectomy; transthoracic echocardiogram (01/2011); [facility] (01/2011); TEE without cardioversion (N/A, 12/16/2016); and Colonoscopy (N/A, 08/19/2017). FAMILY HISTORY Her family history includes Alcohol abuse in her father; Heart disease in her father. SOCIAL HISTORY She  reports that she quit smoking about 13 months ago. Her smoking use included cigarettes. She has a 22.00 pack-year smoking history. she has never used smokeless tobacco. She reports that she does not drink alcohol or use drugs.   MEDICARE WELLNESS OBJECTIVES: Physical activity:   Cardiac risk factors:   Depression/mood screen:   Depression screen [facility] 2/9 09/02/2017  Decreased Interest 1  Down, Depressed, Hopeless 1  PHQ - 2 Score 2  Altered sleeping 1  Tired, decreased energy 2  Feeling bad or failure about yourself  0  Trouble concentrating 0  PHQ-9 Score 5  Some recent data might be hidden    ADLs:  In your present state of health, do you have any difficulty performing the following activities: 09/02/2017 09/02/2017  Hearing? N N  Vision? N N  Difficulty concentrating or making decisions? N N  Walking or climbing stairs? N N  Dressing or bathing? N N  Doing errands, shopping? N N  Some recent data might be hidden     Cognitive  Testing  Alert? Yes  Normal Appearance?Yes  Oriented to person? Yes  Place? Yes   Time? Yes  Recall of three objects?  Yes  Can perform simple calculations? Yes  Displays appropriate judgment?Yes  Can read the correct time from  a watch face?Yes  EOL planning:    ROS   Objective:     There were no vitals filed for this visit. There is no height or weight on file to calculate BMI.  General appearance: alert, no distress, WD/WN, female HEENT: normocephalic, sclerae anicteric, TMs pearly, nares patent, no discharge or erythema, pharynx normal Oral cavity: MMM, no lesions Neck: supple, no lymphadenopathy, no thyromegaly, no masses Heart: RRR, normal S1, S2, no murmurs Lungs: CTA bilaterally, no wheezes, rhonchi, or rales Abdomen: +bs, soft, non tender, non distended, no masses, no hepatomegaly, no splenomegaly Musculoskeletal: nontender, no swelling, no obvious deformity Extremities: no edema, no cyanosis, no clubbing Pulses: 2+ symmetric, upper and lower extremities, normal cap refill Neurological: alert, oriented x 3, CN2-12 intact, strength normal upper extremities and lower extremities, sensation normal throughout, DTRs 2+ throughout, no cerebellar signs, gait normal Psychiatric: normal affect, behavior normal, pleasant   Medicare Attestation I have personally reviewed: The patient's medical and social history Their use of alcohol, tobacco or illicit drugs Their current medications and supplements The patient's functional ability including ADLs,fall risks, home safety risks, cognitive, and hearing and visual impairment Diet and physical activities Evidence for depression or mood disorders  The patient's weight, height, BMI, and visual acuity have been recorded in the chart.  I have made referrals, counseling, and provided education to the patient based on review of the above and I have provided the patient with a written personalized care plan for preventive services.      Izora Ribas, NP   09/28/2017

## 2017-09-29 ENCOUNTER — Ambulatory Visit: Payer: Self-pay | Admitting: Adult Health

## 2017-10-01 NOTE — Progress Notes (Deleted)
MEDICARE ANNUAL WELLNESS VISIT AND FOLLOW UP  Assessment:   Diagnoses and all orders for this visit:  Medicare annual wellness visit, subsequent  CKD stage 3 ([place]) Increase fluids, avoid NSAIDS, monitor sugars, will monitor -     BASIC METABOLIC PANEL WITH GFR -     Microalbumin / creatinine urine ratio  Essential hypertension At goal; continue medication Monitor blood pressure at home; call if consistently over 130/80 Continue DASH diet.   Reminder to go to the ER if any CP, SOB, nausea, dizziness, severe HA, changes vision/speech, left arm numbness and tingling and jaw pain. -     Magnesium  Paroxysmal SVT (supraventricular tachycardia) (HCC)      Continue follow up with cardiology  Atrial flutter with rapid ventricular response (HCC)       Check PT/INR       Continue follow up with cardiology  Dilated cardiomyopathy [facility])       Continue follow up with cardiology  Pulmonary hypertension ([place])        Continue follow up with pulmonology  Chronic diastolic congestive heart failure ([place]) Disease process and medications discussed. Questions answered fully. Emphasized salt restriction, less than 2000mg  a day. Encouraged daily monitoring of the patient's weight, call office if 5 lb weight loss or gain in a day.  Encouraged regular exercise. If any increasing shortness of breath, swelling, or chest pressure go to ER immediately.  decrease your fluid intake to less than 2 L daily please remember to always increase your potassium intake with any increase of your fluid pill.   Typical atrial flutter (HCC) -     Protime-INR  Aortic atherosclerosis (HCC) Control blood pressure, cholesterol, glucose, increase exercise.   Abnormality of mitral valve annulus Continue follow up with cardiology  Chronic obstructive pulmonary disease, unspecified COPD type ([place]) End stage; O2 dependent Continue daily respiratory medications Follow up pulmonology Continue close follow  up Recommended she stop smoking  OSA (obstructive sleep apnea) Continue BiPAP  Gastroesophageal reflux disease without esophagitis Well managed on current medications; continue PPI secondary to end stage disease and recent GI bleeding requiring transfusion Discussed diet, avoiding triggers and other lifestyle changes -     Magnesium  Hypothyroidism, unspecified type continue medications the same reminded to take on an empty stomach 30-67mins before food.  check TSH level  Diabetes mellitus type 2 in obese [facility]) Well controlled; continue medications: metformin Continue diet Reminded to get annual diabetic eye exam  Perform daily foot/skin check, notify office of any concerning changes.  Foot exam performed -     Hemoglobin A1c  Morbid obesity (HCC) Recommended diet heavy in fruits and veggies and low in animal meats, cheeses, and dairy products, appropriate calorie intake Discussed appropriate weight for height  Follow up 1 month  Iron deficiency anemia, unspecified iron deficiency anemia type       ? Related to GI bleed; continue iron supplement, monitor -     CBC with Differential/Platelet  History of colonic polyps       Likely can no longer tolerate colonoscopies secondary to end stage respiratory disease  Pure hypercholesterolemia At goal; continue statin Continue low cholesterol diet and exercise. -     Lipid panel -     TSH  Vitamin D deficiency Continue supplementation for goal of 70-100 -     VITAMIN D 25 Hydroxy (Vit-D Deficiency, Fractures)  Medication management -     CBC with Differential/Platelet -     BASIC METABOLIC PANEL WITH  GFR -     Hepatic function panel  Tobacco abuse Smoking cessation-  instruction/counseling given, counseled patient on the dangers of tobacco use, advised patient to stop smoking, and reviewed strategies to maximize success, patient not ready to quit at this time.   Anemia due to acute blood loss CBC  DNR (do not  resuscitate) Reviewed  Over 40 minutes of exam, counseling, chart review and critical decision making was performed Future Appointments  Date Time Provider [place]  10/02/2017  3:00 PM Liane Comber, NP [facility] None  10/20/2017  3:30 PM [name], PA-C [facility] None  11/17/2017  3:30 PM Liane Comber, NP [facility] None  12/16/2017  3:00 PM Unk Pinto, MD [facility] None     Plan:   During the course of the visit the patient was educated and counseled about appropriate screening and preventive services including:    Pneumococcal vaccine   Prevnar 13  Influenza vaccine  Td vaccine  Screening electrocardiogram  Bone densitometry screening  Colorectal cancer screening  Diabetes screening  Glaucoma screening  Nutrition counseling   Advanced directives: requested   Subjective:  Jillian Wood is a 72 y.o. female presenting accompanied by her son, with HTN, HLD, ASHD/pAfib, ES O2 dependent COPD/OSA/CPAP, Morbid Obesity (BMI 41+), T2_DM, Hypothyroidism, GERD and Vitamin D Deficiency who presents for Medicare Annual Wellness Visit and 1 month follow up for coumadin management.  She was recently hospitalized on 11/28 due to GI bleed with acute decline in hemoglobin (10.2 in 10/1 to 7.7 11/27). She was seen by Dr. Melford Aase for hospital follow up on 12/18 with improved/stable hgb of 10.2.   She has end stage COPD dependent on 8 L of O2 with sats 93-95% at rest, quickly drops with any activity to 82-88%. With RA she quickly drops to 78-82%. She reports she is doing well today, and feels at her baseline. She denies dyspnea, CP, palpitations, HA,   She is not confused, interacts approrpiately .   She has a history of {type of heart failure:30421350}, denies {BlankSingle:19196::"dyspnea on exertion","orthopnea","paroxysmal nocturnal dyspnea","edema"}. Positive for {CARDIAC SYMPTOMS:12860}. Wt Readings from Last 3 Encounters:  10/02/17 248 lb (112.5 kg)   09/02/17 249 lb 6.4 oz (113.1 kg)  08/20/17 239 lb 3.2 oz (108.5 kg)      Patient is on Coumadin for Typical Atrial flutter.  Patient's last INR is  Lab Results  Component Value Date   INR 2.3 (H) 09/02/2017   INR 1.10 08/20/2017   INR 1.19 08/18/2017    Patient denies SOB, CP, dizziness, nose bleeds, easy bleeding, and blood in stool/urine. Her coumadin dose {WAS/WAS NOT:[phone]::"was not"} changed last visit. She {HAS HAS NOT:18834} taken ABX, {HAS HAS NOT:18834} missed any doses and {Actions; denies-reports:120008} a fall.    She has a history of Diastolic CHF, denies {PTWSFKCLEXN:17001::"VCBSWHQ on exertion","orthopnea","paroxysmal nocturnal dyspnea","edema"}. Positive for {CARDIAC SYMPTOMS:12860}. Wt Readings from Last 3 Encounters:  09/02/17 249 lb 6.4 oz (113.1 kg)  08/20/17 239 lb 3.2 oz (108.5 kg)  08/12/17 253 lb (114.8 kg)   She is on cholesterol medication and denies myalgias. Her cholesterol is at goal. The cholesterol last visit was:   Lab Results  Component Value Date   CHOL 139 05/26/2017   HDL 58 05/26/2017   LDLCALC 80 02/11/2017   TRIG 125 05/26/2017   CHOLHDL 2.4 05/26/2017    She {Has/has not:18111} been working on diet and exercise for prediabetes recently well controlled, and denies {Symptoms; diabetes w/o none:19199}. Last A1C in the office  was:  Lab Results  Component Value Date   HGBA1C 5.7 (H) 05/26/2017   Last GFR: Lab Results  Component Value Date   GFRNONAA 47 (L) 09/02/2017   Patient is on Vitamin D supplement and near goal at last check:    Lab Results  Component Value Date   VD25OH 68 02/11/2017      Medication Review: Current Outpatient Medications on File Prior to Visit  Medication Sig Dispense Refill  . Acetaminophen (TYLENOL 8 HOUR ARTHRITIS PAIN PO) Take 1 tablet by mouth. PRN    . acetaminophen-codeine (TYLENOL #3) 300-30 MG tablet Take 1 tablet by mouth every 8 (eight) hours as needed for moderate pain or severe pain. 90  tablet 0  . acetaZOLAMIDE (DIAMOX) 250 MG tablet TAKE 1 TABLET (250 MG TOTAL) BY MOUTH 3 (THREE) TIMES DAILY. 270 tablet 1  . albuterol (PROVENTIL HFA;VENTOLIN HFA) 108 (90 Base) MCG/ACT inhaler Inhale 1-2 puffs into the lungs every 6 (six) hours as needed for wheezing or shortness of breath. 18 g 2  . aspirin 81 MG tablet Take 81 mg by mouth daily.    . bisoprolol (ZEBETA) 10 MG tablet TAKE 1 TABLET (10 MG TOTAL) BY MOUTH DAILY. 90 tablet 1  . budesonide (PULMICORT) 0.5 MG/2ML nebulizer solution Take 2 mLs (0.5 mg total) by nebulization 2 (two) times daily. (Patient not taking: Reported on 08/12/2017) 360 mL 3  . Cholecalciferol (VITAMIN D PO) Take 2,000 Units by mouth daily.     . diazepam (VALIUM) 5 MG tablet Take one tablet up to 2 x a daily for anxiety AS NEEDED 60 tablet 0  . diltiazem (CARDIZEM CD) 300 MG 24 hr capsule TAKE 1 CAPSULE (300 MG TOTAL) BY MOUTH DAILY. 90 capsule 1  . DULoxetine (CYMBALTA) 60 MG capsule TAKE 1 CAPSULE BY MOUTH DAILY 90 capsule 1  . famotidine (PEPCID) 20 MG tablet TAKE 1 TABLET (20 MG TOTAL) BY MOUTH 2 (TWO) TIMES DAILY. FOR ACID REFLUX 180 tablet 1  . ferrous sulfate 325 (65 FE) MG tablet Take 325 mg by mouth 2 (two) times daily with a meal.    . fluticasone (FLONASE) 50 MCG/ACT nasal spray Place 2 sprays into both nostrils daily. (Patient taking differently: Place 2 sprays into both nostrils daily as needed for allergies. ) 16 g 0  . furosemide (LASIX) 80 MG tablet Take 1 tablet (80 mg total) by mouth daily. 90 tablet 0  . guaiFENesin (MUCINEX) 600 MG 12 hr tablet Take 600 mg by mouth 2 (two) times daily.     Marland Kitchen ipratropium (ATROVENT) 0.02 % nebulizer solution Take 2.5 mLs (0.5 mg total) by nebulization every 6 (six) hours. 75 mL 12  . levalbuterol (XOPENEX) 0.63 MG/3ML nebulizer solution Take 3 mLs (0.63 mg total) by nebulization every 6 (six) hours. 360 mL 11  . levothyroxine (SYNTHROID, LEVOTHROID) 175 MCG tablet TAKE 1 TABLET (175 MCG TOTAL) BY MOUTH DAILY  BEFORE BREAKFAST. 90 tablet 1  . loratadine-pseudoephedrine (CLARITIN-D 24-HOUR) 10-240 MG per 24 hr tablet Take 1 tablet by mouth as needed for allergies.     . Magnesium Chloride (MAGNESIUM DR PO) Take 1 capsule by mouth daily.     . metFORMIN (GLUCOPHAGE-XR) 500 MG 24 hr tablet TAKE 1 TABLET (500 MG TOTAL) BY MOUTH 4 (FOUR) TIMES DAILY - AFTER MEALS AND AT BEDTIME. 360 tablet 1  . OXYGEN-HELIUM IN Inhale 5-6 L into the lungs continuous.     . pantoprazole (PROTONIX) 40 MG tablet Take 1 tablet (40  mg total) by mouth daily at 6 (six) AM. (Patient not taking: Reported on 09/02/2017) 30 tablet 0  . polyethylene glycol (MIRALAX / GLYCOLAX) packet Take 17 g by mouth 2 (two) times daily. 14 each 0  . pravastatin (PRAVACHOL) 40 MG tablet TAKE 1 TABLET (40 MG TOTAL) BY MOUTH DAILY. 90 tablet 1  . warfarin (COUMADIN) 5 MG tablet Take 1-1.5 tablets (5-7.5 mg total) by mouth daily at 6 PM. TAKES 7.5MG  ON TUES, THURS, SUNDAY TAKES 5MG  ALL OTHER DAYS 30 tablet 2   No current facility-administered medications on file prior to visit.     Allergies  Allergen Reactions  . Inderal [Propranolol] Other (See Comments)    Hair loss  . Ace Inhibitors Cough    Current Problems (verified) Patient Active Problem List   Diagnosis Date Noted  . CKD (chronic kidney disease) stage 3, GFR 30-59 ml/min (HCC) 09/22/2017  . OSA (obstructive sleep apnea) 08/14/2017  . Anemia 08/14/2017  . DNR (do not resuscitate) 08/14/2017  . Abnormality of mitral valve annulus   . Aortic atherosclerosis ([place]) 12/09/2016  . Typical atrial flutter ([place])   . CHF (congestive heart failure) ([place]) 11/11/2016  . Dilated cardiomyopathy ([place])   . Pulmonary hypertension ([place])   . Other abnormal glucose   . COPD (chronic obstructive pulmonary disease) ([place]) 07/31/2016  . Atrial flutter with rapid ventricular response ([place]) 07/31/2016  . GERD (gastroesophageal reflux disease) 07/31/2015  . Tobacco abuse 10/18/2014  . Paroxysmal SVT  (supraventricular tachycardia) ([place]) 08/04/2014  . Hypothyroidism 02/22/2014  . Medication management 11/06/2013  . Essential hypertension   . Hyperlipidemia   . Morbid obesity ([place])   . Vitamin D deficiency   . Iron deficiency anemia 07/11/2010  . History of colonic polyps 07/11/2010    Screening Tests Immunization History  Administered Date(s) Administered  . Influenza, High Dose Seasonal PF 08/16/2014, 07/27/2015, 06/06/2016, 08/13/2017  . Pneumococcal Conjugate-13 07/27/2015  . Pneumococcal-Unspecified 05/16/2011  . Tdap 08/16/2008   Preventative care: Last colonoscopy: 2011 Last mammogram:  2015*** DEXA:2015  Prior vaccinations: TD or Tdap: 2009  Influenza: 2018  Pneumococcal: 2015 Prevnar13: 2016 Shingles/Zostavax: Declined   Names of Other Physician/Practitioners you currently use: 1. [facility]- here for primary care 2. Has not seen, eye doctor, last visit 2016 3. Doesn't see, dentist, last visit remote, dentures  Patient Care Team: Unk Pinto, MD as PCP - General (Internal Medicine) Lorretta Harp, MD as PCP - Cardiology (Cardiology) Evans Lance, MD as Consulting Physician (Cardiology) Lorretta Harp, MD as Consulting Physician (Cardiology) Inda Castle, MD (Inactive) as Consulting Physician (Gastroenterology) Paulla Dolly Tamala Fothergill, DPM as Consulting Physician (Podiatry)  SURGICAL HISTORY She  has a past surgical history that includes Carpal tunnel release; Eye surgery (Bilateral); Pubovaginal sling; Tonsillectomy and adenoidectomy; transthoracic echocardiogram (01/2011); NM MYOVIEW LTD (01/2011); TEE without cardioversion (N/A, 12/16/2016); and Colonoscopy (N/A, 08/19/2017). FAMILY HISTORY Her family history includes Alcohol abuse in her father; Heart disease in her father. SOCIAL HISTORY She  reports that she quit smoking about 14 months ago. Her smoking use included cigarettes. She has a 22.00 pack-year  smoking history. she has never used smokeless tobacco. She reports that she does not drink alcohol or use drugs.   MEDICARE WELLNESS OBJECTIVES: Physical activity:   Cardiac risk factors:   Depression/mood screen:   Depression screen [facility] 2/9 09/02/2017  Decreased Interest 1  Down, Depressed, Hopeless 1  PHQ - 2 Score 2  Altered sleeping 1  Tired,  decreased energy 2  Feeling bad or failure about yourself  0  Trouble concentrating 0  PHQ-9 Score 5  Some recent data might be hidden    ADLs:  In your present state of health, do you have any difficulty performing the following activities: 09/02/2017 09/02/2017  Hearing? N N  Vision? N N  Difficulty concentrating or making decisions? N N  Walking or climbing stairs? N N  Dressing or bathing? N N  Doing errands, shopping? N N  Some recent data might be hidden     Cognitive Testing  Alert? Yes  Normal Appearance?Yes  Oriented to person? Yes  Place? Yes   Time? Yes  Recall of three objects?  Yes  Can perform simple calculations? Yes  Displays appropriate judgment?Yes  Can read the correct time from a watch face?Yes  EOL planning:    ROS   Objective:     There were no vitals filed for this visit. There is no height or weight on file to calculate BMI.  General appearance: alert, no distress, WD/WN, female HEENT: normocephalic, sclerae anicteric, TMs pearly, nares patent, no discharge or erythema, pharynx normal Oral cavity: MMM, no lesions Neck: supple, no lymphadenopathy, no thyromegaly, no masses Heart: RRR, normal S1, S2, no murmurs Lungs: CTA bilaterally, no wheezes, rhonchi, or rales Abdomen: +bs, soft, non tender, non distended, no masses, no hepatomegaly, no splenomegaly Musculoskeletal: nontender, no swelling, no obvious deformity Extremities: no edema, no cyanosis, no clubbing Pulses: 2+ symmetric, upper and lower extremities, normal cap refill Neurological: alert, oriented x 3, CN2-12 intact, strength normal  upper extremities and lower extremities, sensation normal throughout, DTRs 2+ throughout, no cerebellar signs, gait normal Psychiatric: normal affect, behavior normal, pleasant   Medicare Attestation I have personally reviewed: The patient's medical and social history Their use of alcohol, tobacco or illicit drugs Their current medications and supplements The patient's functional ability including ADLs,fall risks, home safety risks, cognitive, and hearing and visual impairment Diet and physical activities Evidence for depression or mood disorders  The patient's weight, height, BMI, and visual acuity have been recorded in the chart.  I have made referrals, counseling, and provided education to the patient based on review of the above and I have provided the patient with a written personalized care plan for preventive services.     Izora Ribas, NP   10/01/2017

## 2017-10-02 ENCOUNTER — Encounter: Payer: Self-pay | Admitting: Adult Health

## 2017-10-02 ENCOUNTER — Ambulatory Visit (INDEPENDENT_AMBULATORY_CARE_PROVIDER_SITE_OTHER): Payer: PPO | Admitting: Adult Health

## 2017-10-02 VITALS — BP 132/76 | HR 95 | Temp 97.3°F | Wt 248.0 lb

## 2017-10-02 DIAGNOSIS — I10 Essential (primary) hypertension: Secondary | ICD-10-CM

## 2017-10-02 DIAGNOSIS — R7309 Other abnormal glucose: Secondary | ICD-10-CM | POA: Diagnosis not present

## 2017-10-02 DIAGNOSIS — N183 Chronic kidney disease, stage 3 unspecified: Secondary | ICD-10-CM

## 2017-10-02 DIAGNOSIS — I5032 Chronic diastolic (congestive) heart failure: Secondary | ICD-10-CM | POA: Diagnosis not present

## 2017-10-02 DIAGNOSIS — D509 Iron deficiency anemia, unspecified: Secondary | ICD-10-CM | POA: Diagnosis not present

## 2017-10-02 DIAGNOSIS — E7800 Pure hypercholesterolemia, unspecified: Secondary | ICD-10-CM

## 2017-10-02 DIAGNOSIS — E039 Hypothyroidism, unspecified: Secondary | ICD-10-CM | POA: Diagnosis not present

## 2017-10-02 DIAGNOSIS — K219 Gastro-esophageal reflux disease without esophagitis: Secondary | ICD-10-CM | POA: Diagnosis not present

## 2017-10-02 DIAGNOSIS — E559 Vitamin D deficiency, unspecified: Secondary | ICD-10-CM | POA: Diagnosis not present

## 2017-10-02 DIAGNOSIS — Z79899 Other long term (current) drug therapy: Secondary | ICD-10-CM | POA: Diagnosis not present

## 2017-10-02 DIAGNOSIS — I483 Typical atrial flutter: Secondary | ICD-10-CM

## 2017-10-02 DIAGNOSIS — J449 Chronic obstructive pulmonary disease, unspecified: Secondary | ICD-10-CM

## 2017-10-02 NOTE — Progress Notes (Signed)
FOLLOW UP  Assessment and Plan:   Gertrude was seen today for follow-up.  Diagnoses and all orders for this visit:  Chronic diastolic congestive heart failure ([employer]) Disease process and medications discussed. Questions answered fully. Emphasized salt restriction, less than 2000mg  a day. Encouraged daily monitoring of the patient's weight, call office if 5 lb weight loss or gain in a day.  Encouraged regular exercise. If any increasing shortness of breath, swelling, or chest pressure go to ER immediately.  decrease your fluid intake to less than 2 L daily please remember to always increase your potassium intake with any increase of your fluid pill.   Typical atrial flutter (HCC) -     Protime-INR  Chronic obstructive pulmonary disease, unspecified COPD type ([place]) Doing well, stable; Continue O2, follow up quickly for new cough or respiratory distress -     BASIC METABOLIC PANEL WITH GFR  Gastroesophageal reflux disease without esophagitis Well managed on current medications; continue PPI secondary to recent GI bleed Discussed diet, avoiding triggers and other lifestyle changes  Hypothyroidism, unspecified type continue medications the same reminded to take on an empty stomach 30-77mins before food.  -     TSH  CKD (chronic kidney disease) stage 3, GFR 30-59 ml/min (HCC) -     BASIC METABOLIC PANEL WITH GFR  Iron deficiency anemia, unspecified iron deficiency anemia type -     CBC with Differential/Platelet  Medication management -     CBC with Differential/Platelet -     BASIC METABOLIC PANEL WITH GFR -     Hepatic function panel  Hypertension Well controlled with current medications  Monitor blood pressure at home; patient to call if consistently greater than 130/80 Continue DASH diet.   Reminder to go to the ER if any CP, SOB, nausea, dizziness, severe HA, changes vision/speech, left arm numbness and tingling and jaw pain.  Cholesterol Currently at goal; continue  statin Continue low cholesterol diet and exercise.  Check lipid panel.   Prediabetes Discussed disease and risks Discussed diet/exercise, weight management  A1C  Vitamin D Def At goal at last visit; continue supplementation to maintain goal of 70-100 Check Vit D level  Continue diet and meds as discussed. Further disposition pending results of labs. Discussed med's effects and SE's.   Over 30 minutes of exam, counseling, chart review, and critical decision making was performed.   Future Appointments  Date Time Provider [place]  10/20/2017  3:30 PM [name], PA-C [facility] None  11/17/2017  3:30 PM Liane Comber, NP [facility] None  12/16/2017  3:00 PM Unk Pinto, MD [facility] None    ----------------------------------------------------------------------------------------------------------------------  HPI JAYDIN JALOMO is a 72 y.o. female HTN, HLD, ASHD/pAfib, ES O2 dependent COPD/OSA/CPAP, Morbid Obesity (BMI 41+), T2_DM, Hypothyroidism, GERD and Vitamin D Deficiency who presents for presents for 1 month follow up on chronic medical conditions. She was recently hospitalized in Nov/Dec 2018 for acute drop in Hgb secondary to GI bleed. Her coumadin was discontinued and has not been restarted secondary to bleeding concerns and end stage COPD/chronic respiratory failure. She presents reporting she feels quite well today with no concerns, though report she has had several "bad" days - crying regarding her husband passing away. Discussed at length, encouraged to follow up with hospice counseling. She reports anxiety is doing well, uses valium approx 1x/week.   She is nearly wheelchair bound and on 8L O2 for end stage respiratory disease - her sats are 93-95% at rest, but with any activity quickly  drops to 82-88%. On RA at rest she quickly drops to 72-80% - per her son, they have noted her O2 drop to 50s/60s % when oxygen cannula has fallen off while asleep at home.    She has a history of mild Diastolic CHF, denies orthopnea, paroxysmal nocturnal dyspnea and edema. Positive for dyspnea and fatigue. Wt Readings from Last 3 Encounters:  10/02/17 248 lb (112.5 kg)  09/02/17 249 lb 6.4 oz (113.1 kg)  08/20/17 239 lb 3.2 oz (108.5 kg)    Her blood pressure has been controlled at home, today their BP is BP: 132/76  She does not workout. She denies chest pain, shortness of breath, dizziness.  she has a diagnosis of GERD which is currently managed by protonix 40 mg daily secondary to recent GI bleed.  she reports symptoms is currently well controlled, and denies breakthrough reflux, burning in chest, hoarseness or cough. She denies hematochezia or melena.     She is on cholesterol medication and denies myalgias. Her cholesterol is at goal. The cholesterol last visit was:   Lab Results  Component Value Date   CHOL 139 05/26/2017   HDL 58 05/26/2017   LDLCALC 80 02/11/2017   TRIG 125 05/26/2017   CHOLHDL 2.4 05/26/2017    She has not been working on diet and exercise for prediabetes, and denies foot ulcerations, increased appetite, nausea, paresthesia of the feet, polydipsia, polyuria, visual disturbances and vomiting. Last A1C in the office was:  Lab Results  Component Value Date   HGBA1C 5.7 (H) 05/26/2017   Patient is on Vitamin D supplement.   Lab Results  Component Value Date   VD25OH 68 02/11/2017        Current Medications:  Current Outpatient Medications on File Prior to Visit  Medication Sig  . Acetaminophen (TYLENOL 8 HOUR ARTHRITIS PAIN PO) Take 1 tablet by mouth. PRN  . acetaminophen-codeine (TYLENOL #3) 300-30 MG tablet Take 1 tablet by mouth every 8 (eight) hours as needed for moderate pain or severe pain.  Marland Kitchen acetaZOLAMIDE (DIAMOX) 250 MG tablet TAKE 1 TABLET (250 MG TOTAL) BY MOUTH 3 (THREE) TIMES DAILY.  Marland Kitchen albuterol (PROVENTIL HFA;VENTOLIN HFA) 108 (90 Base) MCG/ACT inhaler Inhale 1-2 puffs into the lungs every 6 (six) hours as  needed for wheezing or shortness of breath.  Marland Kitchen aspirin 81 MG tablet Take 81 mg by mouth daily.  . bisoprolol (ZEBETA) 10 MG tablet TAKE 1 TABLET (10 MG TOTAL) BY MOUTH DAILY.  . budesonide (PULMICORT) 0.5 MG/2ML nebulizer solution Take 2 mLs (0.5 mg total) by nebulization 2 (two) times daily. (Patient not taking: Reported on 08/12/2017)  . Cholecalciferol (VITAMIN D PO) Take 2,000 Units by mouth daily.   . diazepam (VALIUM) 5 MG tablet Take one tablet up to 2 x a daily for anxiety AS NEEDED  . diltiazem (CARDIZEM CD) 300 MG 24 hr capsule TAKE 1 CAPSULE (300 MG TOTAL) BY MOUTH DAILY.  . DULoxetine (CYMBALTA) 60 MG capsule TAKE 1 CAPSULE BY MOUTH DAILY  . famotidine (PEPCID) 20 MG tablet TAKE 1 TABLET (20 MG TOTAL) BY MOUTH 2 (TWO) TIMES DAILY. FOR ACID REFLUX (Patient not taking: Reported on 10/02/2017)  . ferrous sulfate 325 (65 FE) MG tablet Take 325 mg by mouth 2 (two) times daily with a meal.  . fluticasone (FLONASE) 50 MCG/ACT nasal spray Place 2 sprays into both nostrils daily. (Patient taking differently: Place 2 sprays into both nostrils daily as needed for allergies. )  . furosemide (LASIX) 80 MG  tablet Take 1 tablet (80 mg total) by mouth daily.  Marland Kitchen guaiFENesin (MUCINEX) 600 MG 12 hr tablet Take 600 mg by mouth 2 (two) times daily.   Marland Kitchen ipratropium (ATROVENT) 0.02 % nebulizer solution Take 2.5 mLs (0.5 mg total) by nebulization every 6 (six) hours.  Marland Kitchen levalbuterol (XOPENEX) 0.63 MG/3ML nebulizer solution Take 3 mLs (0.63 mg total) by nebulization every 6 (six) hours.  Marland Kitchen levothyroxine (SYNTHROID, LEVOTHROID) 175 MCG tablet TAKE 1 TABLET (175 MCG TOTAL) BY MOUTH DAILY BEFORE BREAKFAST.  Marland Kitchen loratadine-pseudoephedrine (CLARITIN-D 24-HOUR) 10-240 MG per 24 hr tablet Take 1 tablet by mouth as needed for allergies.   . Magnesium Chloride (MAGNESIUM DR PO) Take 1 capsule by mouth daily.   . metFORMIN (GLUCOPHAGE-XR) 500 MG 24 hr tablet TAKE 1 TABLET (500 MG TOTAL) BY MOUTH 4 (FOUR) TIMES DAILY - AFTER  MEALS AND AT BEDTIME.  Marland Kitchen OXYGEN-HELIUM IN Inhale 5-6 L into the lungs continuous.   . pantoprazole (PROTONIX) 40 MG tablet Take 1 tablet (40 mg total) by mouth daily at 6 (six) AM. (Patient not taking: Reported on 09/02/2017)  . polyethylene glycol (MIRALAX / GLYCOLAX) packet Take 17 g by mouth 2 (two) times daily.  . pravastatin (PRAVACHOL) 40 MG tablet TAKE 1 TABLET (40 MG TOTAL) BY MOUTH DAILY.  Marland Kitchen warfarin (COUMADIN) 5 MG tablet Take 1-1.5 tablets (5-7.5 mg total) by mouth daily at 6 PM. TAKES 7.5MG  ON TUES, THURS, SUNDAY TAKES 5MG  ALL OTHER DAYS (Patient not taking: Reported on 10/02/2017)   No current facility-administered medications on file prior to visit.      Allergies:  Allergies  Allergen Reactions  . Inderal [Propranolol] Other (See Comments)    Hair loss  . Ace Inhibitors Cough     Medical History:  Past Medical History:  Diagnosis Date  . Arthritis   . CHF (congestive heart failure) ([place])   . COPD (chronic obstructive pulmonary disease) ([place])   . Depression   . Diabetes mellitus type 2 in obese ([place])   . GERD (gastroesophageal reflux disease)   . Hyperlipidemia   . Hypertension   . Morbid obesity ([place])   . OAB (overactive bladder)   . PSVT (paroxysmal supraventricular tachycardia) ([place])   . Thyroid disease   . Vitamin D deficiency    Family history- Reviewed and unchanged Social history- Reviewed and unchanged   Review of Systems:  Review of Systems  Constitutional: Negative for chills, fever, malaise/fatigue and weight loss.  HENT: Negative for hearing loss and tinnitus.   Eyes: Negative for blurred vision and double vision.  Respiratory: Positive for shortness of breath (Consistent with baseline - "I feel great" "10/10" ). Negative for cough, sputum production and wheezing.   Cardiovascular: Negative for chest pain, palpitations, orthopnea, claudication and leg swelling.  Gastrointestinal: Negative for abdominal pain, blood in stool, constipation,  diarrhea, heartburn, melena, nausea and vomiting.  Genitourinary: Negative.   Musculoskeletal: Negative for joint pain and myalgias.  Skin: Negative for rash.  Neurological: Negative for dizziness, tingling, sensory change, weakness and headaches.  Endo/Heme/Allergies: Negative for polydipsia.  Psychiatric/Behavioral: Negative.   All other systems reviewed and are negative.     Physical Exam: BP 132/76   Pulse 95   Temp (!) 97.3 F (36.3 C)   Wt 248 lb (112.5 kg)   BMI 45.36 kg/m  Wt Readings from Last 3 Encounters:  10/02/17 248 lb (112.5 kg)  09/02/17 249 lb 6.4 oz (113.1 kg)  08/20/17 239 lb 3.2 oz (108.5 kg)  General Appearance: Well nourished, obese female in no apparent distress, in wheelchair. Extremely poor hygiene with body odor.  Eyes: PERRLA, EOMs, conjunctiva no swelling or erythema Sinuses: No Frontal/maxillary tenderness ENT/Mouth: Ext aud canals clear, TMs without erythema, bulging. No erythema, swelling, or exudate on post pharynx.  Tonsils not swollen or erythematous. Hearing normal.  Neck: Supple, thyroid normal.  Respiratory: Respiratory effort normal, BS equal bilaterally without rales, rhonchi, wheezing or stridor. On 8 L O2.  Cardio: Heart sounds distant and indistinct secondary to body habitus. Brisk peripheral pulses without edema.  Abdomen: Soft, + BS.  Non tender, no guarding, rebound, hernias, masses. Lymphatics: Non tender without lymphadenopathy.  Musculoskeletal: Non ambulatory in wheelchair.  Skin: Warm, dry without rashes, lesions, ecchymosis.  Neuro: Cranial nerves and sensation intact.   Psych: Awake and oriented X 3, normal affect, Insight and Judgment appropriate.   Izora Ribas, NP 5:47 PM [facility]

## 2017-10-03 ENCOUNTER — Telehealth: Payer: Self-pay | Admitting: Adult Health

## 2017-10-03 LAB — BASIC METABOLIC PANEL WITH GFR
BUN/Creatinine Ratio: 25 (calc) — ABNORMAL HIGH (ref 6–22)
BUN: 25 mg/dL (ref 7–25)
CALCIUM: 10.1 mg/dL (ref 8.6–10.4)
CO2: 36 mmol/L — ABNORMAL HIGH (ref 20–32)
Chloride: 100 mmol/L (ref 98–110)
Creat: 1.01 mg/dL — ABNORMAL HIGH (ref 0.60–0.93)
GFR, EST AFRICAN AMERICAN: 65 mL/min/{1.73_m2} (ref >=60)
GFR, EST NON AFRICAN AMERICAN: 56 mL/min/{1.73_m2} — AB (ref >=60)
Glucose, Bld: 107 mg/dL — ABNORMAL HIGH (ref 65–99)
POTASSIUM: 4.5 mmol/L (ref 3.5–5.3)
Sodium: 144 mmol/L (ref 135–146)

## 2017-10-03 LAB — HEPATIC FUNCTION PANEL
AG RATIO: 1.6 (calc) (ref 1.0–2.5)
ALBUMIN MSPROF: 4.1 g/dL (ref 3.6–5.1)
ALT: 9 U/L (ref 6–29)
AST: 14 U/L (ref 10–35)
Alkaline phosphatase (APISO): 85 U/L (ref 33–130)
BILIRUBIN DIRECT: 0.2 mg/dL (ref 0.0–0.2)
BILIRUBIN TOTAL: 0.5 mg/dL (ref 0.2–1.2)
Globulin: 2.5 g/dL (calc) (ref 1.9–3.7)
Indirect Bilirubin: 0.3 mg/dL (calc) (ref 0.2–1.2)
Total Protein: 6.6 g/dL (ref 6.1–8.1)

## 2017-10-03 LAB — CBC WITH DIFFERENTIAL/PLATELET
BASOS ABS: 32 {cells}/uL (ref 0–200)
Basophils Relative: 0.5 %
Eosinophils Absolute: 82 cells/uL (ref 15–500)
Eosinophils Relative: 1.3 %
HCT: 37.7 % (ref 35.0–45.0)
Hemoglobin: 11.1 g/dL — ABNORMAL LOW (ref 11.7–15.5)
LYMPHS ABS: 731 {cells}/uL — AB (ref 850–3900)
MCH: 23.5 pg — ABNORMAL LOW (ref 27.0–33.0)
MCHC: 29.4 g/dL — AB (ref 32.0–36.0)
MCV: 79.9 fL — AB (ref 80.0–100.0)
MPV: 10.1 fL (ref 7.5–12.5)
Monocytes Relative: 7.1 %
NEUTROS PCT: 79.5 %
Neutro Abs: 5009 cells/uL (ref 1500–7800)
Platelets: 328 10*3/uL (ref 140–400)
RBC: 4.72 10*6/uL (ref 3.80–5.10)
RDW: 16.6 % — AB (ref 11.0–15.0)
Total Lymphocyte: 11.6 %
WBC: 6.3 10*3/uL (ref 3.8–10.8)
WBCMIX: 447 {cells}/uL (ref 200–950)

## 2017-10-03 LAB — LIPID PANEL
CHOL/HDL RATIO: 2.8 (calc) (ref <5.0)
Cholesterol: 156 mg/dL (ref <200)
HDL: 56 mg/dL (ref >50)
LDL CHOLESTEROL (CALC): 80 mg/dL
NON-HDL CHOLESTEROL (CALC): 100 mg/dL (ref <130)
Triglycerides: 105 mg/dL (ref <150)

## 2017-10-03 LAB — TSH: TSH: 1.47 mIU/L (ref 0.40–4.50)

## 2017-10-03 LAB — HEMOGLOBIN A1C
EAG (MMOL/L): 7.1 (calc)
Hgb A1c MFr Bld: 6.1 % of total Hgb — ABNORMAL HIGH (ref <5.7)
Mean Plasma Glucose: 128 (calc)

## 2017-10-03 LAB — PROTIME-INR
INR: 1
PROTHROMBIN TIME: 10.8 s (ref 9.0–11.5)

## 2017-10-03 LAB — VITAMIN D 25 HYDROXY (VIT D DEFICIENCY, FRACTURES): VIT D 25 HYDROXY: 56 ng/mL (ref 30–100)

## 2017-10-03 NOTE — Telephone Encounter (Signed)
Per patient request. Refaxed form  Medical Necessity O2 portable concentrator to [facility] & [employer]. Patient received denial for DOS  05-2017 to 06-2017. Also, mailed to patient

## 2017-10-06 ENCOUNTER — Telehealth: Payer: Self-pay | Admitting: Pulmonary Disease

## 2017-10-06 NOTE — Telephone Encounter (Signed)
Spoke with Debbie. She stated that she needed to have the vent settings for the patient as well as the clinical rationale for the vent.   Advised her that the patient last saw VS last April and has not followed up since then.   Jackelyn Poling stated that she has called [employer] to get the report and they do not have the settings either. '  Found form from 2017. Faxed it to [place] and she is aware.

## 2017-10-08 ENCOUNTER — Other Ambulatory Visit: Payer: Self-pay

## 2017-10-08 NOTE — Patient Outreach (Signed)
[facility]) Care Management  10/08/2017  Jillian Wood May 04, 1946 [id number]   TELEPHONE SCREENING Referral date: 10/07/17 Referral source: [facility] utilization management Referral reason: cancelled pulmonary appointments.  Needs follow up with pulmonary due to NIV home vent Insurance: [employer]  Telephone call to patient regarding Utilization Management referral. HIPAA verified with patient. Patient gave verbal authorization to speak with her son, [name] regarding her health information.  Patient and son, [name] on line at time of call.  Discussed and offered [facility] care management services to patient. Patient and son state she does not need the program at this time. Patient and son verbally agreed to received [facility] care management letter/ brochure for future reference.   PLAN: RNCM will refer patient to care management assistant to close due to patient patient stating she does not need the services.  RNCM will send notification to patients primary MD of closure.   Quinn Plowman RN,BSN,CCM [facility] Telephonic  [phone]

## 2017-10-11 ENCOUNTER — Other Ambulatory Visit: Payer: Self-pay | Admitting: Internal Medicine

## 2017-10-11 DIAGNOSIS — J449 Chronic obstructive pulmonary disease, unspecified: Secondary | ICD-10-CM | POA: Diagnosis not present

## 2017-10-11 DIAGNOSIS — I483 Typical atrial flutter: Secondary | ICD-10-CM

## 2017-10-11 DIAGNOSIS — J9610 Chronic respiratory failure, unspecified whether with hypoxia or hypercapnia: Secondary | ICD-10-CM | POA: Diagnosis not present

## 2017-10-13 ENCOUNTER — Other Ambulatory Visit: Payer: Self-pay | Admitting: *Deleted

## 2017-10-13 MED ORDER — PANTOPRAZOLE SODIUM 40 MG PO TBEC: 40.0000 mg | DELAYED_RELEASE_TABLET | Freq: Every day | ORAL | 0 refills | Status: DC

## 2017-10-15 ENCOUNTER — Other Ambulatory Visit: Payer: Self-pay | Admitting: *Deleted

## 2017-10-15 MED ORDER — ATENOLOL 50 MG PO TABS: 50.0000 mg | ORAL_TABLET | Freq: Every day | ORAL | 0 refills | Status: DC

## 2017-10-19 DIAGNOSIS — J449 Chronic obstructive pulmonary disease, unspecified: Secondary | ICD-10-CM | POA: Diagnosis not present

## 2017-10-20 ENCOUNTER — Ambulatory Visit: Payer: Self-pay | Admitting: Physician Assistant

## 2017-10-25 DIAGNOSIS — J449 Chronic obstructive pulmonary disease, unspecified: Secondary | ICD-10-CM | POA: Diagnosis not present

## 2017-11-09 NOTE — Progress Notes (Signed)
Patient ID: Jillian Wood, female   DOB: 03/21/1946, 72 y.o.   MRN: [id number]  MEDICARE ANNUAL WELLNESS VISIT AND FOLLOW UP  Assessment:   Essential hypertension - continue medications, DASH diet, exercise and monitor at home. Call if greater than 130/80.   Paroxysmal SVT (supraventricular tachycardia) (HCC) Continue follow up cardio  Atrial fibrillation with RVR (HCC) Only on ASA due to GI bleed  Acute on chronic diastolic CHF (congestive heart failure) ([place]) Continue follow up cardio Weight is up but no swelling, lungs CTAB, will check BNP today  Dilated cardiomyopathy ([place]) Continue follow up cardio  Pulmonary hypertension -     Follow up pulmonary  Typical atrial flutter ([place]) Continue follow up cardio Needs BiPAP  Chronic obstructive pulmonary disease, unspecified COPD type ([place]) - continue duoneb inhaler, doing better, xopenix too expensive - prednisone sent in  Acute on chronic respiratory failure with hypoxia ([place]) -     Ambulatory referral to Pulmonology  Type 2 diabetes mellitus with stage 3 chronic kidney disease, without long-term current use of insulin ([place]) Discussed general issues about diabetes pathophysiology and management., Educational material distributed., Suggested low cholesterol diet., Encouraged aerobic exercise., Discussed foot care., Reminded to get yearly retinal exam.  Diabetes mellitus type 2 in obese [facility]) Discussed general issues about diabetes pathophysiology and management., Educational material distributed., Suggested low cholesterol diet., Encouraged aerobic exercise., Discussed foot care., Reminded to get yearly retinal exam.  Other specified hypothyroidism Hypothyroidism-check TSH level, continue medications the same, reminded to take on an empty stomach 30-27mins before food.   Morbid Obesity with co morbidities - long discussion about weight loss, diet, and exercise  Gastroesophageal reflux disease, esophagitis presence not  specified Continue PPI/H2 blocker, diet discussed  Pure hypercholesterolemia -continue medications, check lipids, decrease fatty foods, increase activity.   Iron deficiency anemia, unspecified iron deficiency anemia type Check cbc  History of colonic polyps  Tobacco abuse States quit 07/2016 but other smokers in the house  Vitamin D deficiency Continue supplement  Medication management  Aortic atherosclerosis ([place]) Control blood pressure, cholesterol, glucose, increase exercise.   Over 30 minutes of exam, counseling, chart review, and critical decision making was performed  Plan:   During the course of the visit the patient was educated and counseled about appropriate screening and preventive services including:    Pneumococcal vaccine   Influenza vaccine  Td vaccine  Prevnar 13  Screening electrocardiogram  Screening mammography  Bone densitometry screening  Colorectal cancer screening  Diabetes screening  Glaucoma screening  Nutrition counseling   Advanced directives: given info/requested copies   Subjective:   Jillian Wood is a 72 y.o. female who presents for Medicare Annual Wellness Visit and 3 month follow up on hypertension, prediabetes, hyperlipidemia, vitamin D def.   Her blood pressure has been controlled at home, today their BP is BP: 132/82 She does not workout. She denies chest pain, shortness of breath, dizziness.   Patient has severe end stage COPD with CO2 retention, she is on 8 L of O2 RECENTLY due to difficulty breathing, depending on activity level and on diamox for CO2 retention. She walks with walker or is in wheel chair, no falls in past year. Sleep study needs to be scheduled, she is on biPAP at night. She is unable to do house work due to her COPD. She has history of CHF. She denies any leg swelling, AB swelling, she has not laid down flat in years due to back pain. .  Wt Readings from  Last 3 Encounters:  11/10/17 262 lb 3.2 oz  (118.9 kg)  10/02/17 248 lb (112.5 kg)  09/02/17 249 lb 6.4 oz (113.1 kg)   She  Is off coumadin and just on bASA due to GI bleed in Nov, unknown cause, unable to do EGD. Also she is suppose to have repeat vaginal [place] with GYN for endometriosis, will schedule.  Lab Results  Component Value Date   INR 1.0 10/02/2017   INR 2.3 (H) 09/02/2017   INR 1.10 08/20/2017   Lab Results  Component Value Date   WBC 6.3 10/02/2017   HGB 11.1 (L) 10/02/2017   HCT 37.7 10/02/2017   MCV 79.9 (L) 10/02/2017   PLT 328 10/02/2017   She is on cholesterol medication and denies myalgias. Her cholesterol is at goal. The cholesterol last visit was:   Lab Results  Component Value Date   CHOL 156 10/02/2017   HDL 56 10/02/2017   LDLCALC 80 02/11/2017   TRIG 105 10/02/2017   CHOLHDL 2.8 10/02/2017   She has been working on diet and exercise for prediabetes, and denies foot ulcerations, hyperglycemia, hypoglycemia , increased appetite, nausea, paresthesia of the feet, polydipsia, polyuria, visual disturbances, vomiting and weight loss. Last A1C in the office was:  Lab Results  Component Value Date   HGBA1C 6.1 (H) 10/02/2017   Last GFR Lab Results  Component Value Date   GFRNONAA 56 (L) 10/02/2017   Patient is on Vitamin D supplement. Lab Results  Component Value Date   VD25OH 56 10/02/2017   She is on thyroid medication. Her medication was changed last visit.   Lab Results  Component Value Date   TSH 1.47 10/02/2017  .  BMI is Body mass index is 47.96 kg/m., she is working on diet and exercise. She has never had sleep study but she is on BiPAP at night and occ during the day. It is medically necessary due to her end stage COPD.  Wt Readings from Last 3 Encounters:  11/10/17 262 lb 3.2 oz (118.9 kg)  10/02/17 248 lb (112.5 kg)  09/02/17 249 lb 6.4 oz (113.1 kg)    Medication Review Current Outpatient Medications on File Prior to Visit  Medication Sig Dispense Refill  . Acetaminophen  (TYLENOL 8 HOUR ARTHRITIS PAIN PO) Take 1 tablet by mouth. PRN    . acetaminophen-codeine (TYLENOL #3) 300-30 MG tablet Take 1 tablet by mouth every 8 (eight) hours as needed for moderate pain or severe pain. 90 tablet 0  . acetaZOLAMIDE (DIAMOX) 250 MG tablet TAKE 1 TABLET (250 MG TOTAL) BY MOUTH 3 (THREE) TIMES DAILY. 270 tablet 1  . albuterol (PROVENTIL HFA;VENTOLIN HFA) 108 (90 Base) MCG/ACT inhaler Inhale 1-2 puffs into the lungs every 6 (six) hours as needed for wheezing or shortness of breath. 18 g 2  . aspirin 81 MG tablet Take 81 mg by mouth daily.    Marland Kitchen atenolol (TENORMIN) 50 MG tablet Take 1 tablet (50 mg total) by mouth daily. 90 tablet 0  . budesonide (PULMICORT) 0.5 MG/2ML nebulizer solution Take 2 mLs (0.5 mg total) by nebulization 2 (two) times daily. 360 mL 3  . Cholecalciferol (VITAMIN D PO) Take 2,000 Units by mouth daily.     . diazepam (VALIUM) 5 MG tablet Take one tablet up to 2 x a daily for anxiety AS NEEDED 60 tablet 0  . diltiazem (CARDIZEM CD) 300 MG 24 hr capsule TAKE 1 CAPSULE (300 MG TOTAL) BY MOUTH DAILY. 90 capsule 1  .  DULoxetine (CYMBALTA) 60 MG capsule TAKE 1 CAPSULE BY MOUTH DAILY 90 capsule 1  . ferrous sulfate 325 (65 FE) MG tablet Take 325 mg by mouth 2 (two) times daily with a meal.    . fluticasone (FLONASE) 50 MCG/ACT nasal spray Place 2 sprays into both nostrils daily. (Patient taking differently: Place 2 sprays into both nostrils daily as needed for allergies. ) 16 g 0  . furosemide (LASIX) 80 MG tablet Take 1 tablet (80 mg total) by mouth daily. 90 tablet 0  . guaiFENesin (MUCINEX) 600 MG 12 hr tablet Take 600 mg by mouth 2 (two) times daily.     Marland Kitchen ipratropium (ATROVENT) 0.02 % nebulizer solution Take 2.5 mLs (0.5 mg total) by nebulization every 6 (six) hours. 75 mL 12  . levothyroxine (SYNTHROID, LEVOTHROID) 175 MCG tablet TAKE 1 TABLET (175 MCG TOTAL) BY MOUTH DAILY BEFORE BREAKFAST. 90 tablet 1  . loratadine-pseudoephedrine (CLARITIN-D 24-HOUR) 10-240  MG per 24 hr tablet Take 1 tablet by mouth as needed for allergies.     . Magnesium Chloride (MAGNESIUM DR PO) Take 1 capsule by mouth daily.     . metFORMIN (GLUCOPHAGE-XR) 500 MG 24 hr tablet TAKE 1 TABLET (500 MG TOTAL) BY MOUTH 4 (FOUR) TIMES DAILY - AFTER MEALS AND AT BEDTIME. 360 tablet 1  . OXYGEN-HELIUM IN Inhale 5-6 L into the lungs continuous.     . pantoprazole (PROTONIX) 40 MG tablet Take 1 tablet (40 mg total) by mouth daily at 6 (six) AM. 90 tablet 0  . polyethylene glycol (MIRALAX / GLYCOLAX) packet Take 17 g by mouth 2 (two) times daily. 14 each 0  . pravastatin (PRAVACHOL) 40 MG tablet TAKE 1 TABLET (40 MG TOTAL) BY MOUTH DAILY. 90 tablet 1   No current facility-administered medications on file prior to visit.     Current Problems (verified) Patient Active Problem List   Diagnosis Date Noted  . CKD (chronic kidney disease) stage 3, GFR 30-59 ml/min (HCC) 09/22/2017  . OSA and COPD overlap syndrome ([place]) 08/14/2017  . DNR (do not resuscitate) 08/14/2017  . Abnormality of mitral valve annulus   . Aortic atherosclerosis ([place]) 12/09/2016  . CHF (congestive heart failure) ([place]) 11/11/2016  . Dilated cardiomyopathy ([place])   . Pulmonary hypertension ([place])   . Other abnormal glucose   . COPD (chronic obstructive pulmonary disease) ([place]) 07/31/2016  . Atrial flutter with rapid ventricular response ([place]) 07/31/2016  . GERD (gastroesophageal reflux disease) 07/31/2015  . Tobacco abuse 10/18/2014  . Paroxysmal SVT (supraventricular tachycardia) ([place]) 08/04/2014  . Hypothyroidism 02/22/2014  . Medication management 11/06/2013  . Essential hypertension   . Hyperlipidemia   . Morbid obesity ([place])   . Vitamin D deficiency   . Iron deficiency anemia 07/11/2010  . History of colonic polyps 07/11/2010    Screening Tests Immunization History  Administered Date(s) Administered  . Influenza, High Dose Seasonal PF 08/16/2014, 07/27/2015, 06/06/2016, 08/13/2017  . Pneumococcal  Conjugate-13 07/27/2015  . Pneumococcal-Unspecified 05/16/2011  . Tdap 08/16/2008    Preventative care: Last colonoscopy: 2018 Last mammogram:  11/2014 [id number] Echo 07/2017 PFT 12/2016 Stress test 2015  Prior vaccinations: TD or Tdap: 2009  Influenza: 2016  Pneumococcal: 2015 Prevnar13: 2016 Shingles/Zostavax: Declined   Names of Other Physician/Practitioners you currently use: 1. [facility]- here for primary care 2. Has not seen, eye doctor, last visit 2016 3. Doesn't see, dentist, last visit remote, dentures Patient Care Team: Unk Pinto, MD as PCP - General (Internal  Medicine) Lorretta Harp, MD as PCP - Cardiology (Cardiology) Evans Lance, MD as Consulting Physician (Cardiology) Lorretta Harp, MD as Consulting Physician (Cardiology) Inda Castle, MD (Inactive) as Consulting Physician (Gastroenterology) Regal, Tamala Fothergill, DPM as Consulting Physician (Podiatry) Dannielle Karvonen, RN as [place] [facility]  Allergies Allergies  Allergen Reactions  . Inderal [Propranolol] Other (See Comments)    Hair loss  . Ace Inhibitors Cough    SURGICAL HISTORY She  has a past surgical history that includes Carpal tunnel release; Eye surgery (Bilateral); Pubovaginal sling; Tonsillectomy and adenoidectomy; transthoracic echocardiogram (01/2011); NM MYOVIEW LTD (01/2011); TEE without cardioversion (N/A, 12/16/2016); and Colonoscopy (N/A, 08/19/2017). FAMILY HISTORY Her family history includes Alcohol abuse in her father; Heart disease in her father. SOCIAL HISTORY She  reports that she quit smoking about 15 months ago. Her smoking use included cigarettes. She has a 22.00 pack-year smoking history. she has never used smokeless tobacco. She reports that she does not drink alcohol or use drugs.  MEDICARE WELLNESS OBJECTIVES: Physical activity: Current Exercise Habits: The patient does not participate in regular  exercise at present, Exercise limited by: respiratory conditions(s) Cardiac risk factors: Cardiac Risk Factors include: advanced age (>61men, >30 women);dyslipidemia Depression/mood screen:   Depression screen [facility] 2/9 11/10/2017  Decreased Interest 0  Down, Depressed, Hopeless 0  PHQ - 2 Score 0  Altered sleeping -  Tired, decreased energy -  Feeling bad or failure about yourself  -  Trouble concentrating -  PHQ-9 Score -  Some recent data might be hidden    ADLs:  In your present state of health, do you have any difficulty performing the following activities: 11/10/2017 09/02/2017  Hearing? N N  Vision? N N  Difficulty concentrating or making decisions? Y N  Walking or climbing stairs? Y N  Dressing or bathing? Y N  Doing errands, shopping? Y N  Preparing Food and eating ? Y -  Using the Toilet? Y -  In the past six months, have you accidently leaked urine? N -  Do you have problems with loss of bowel control? N -  Managing your Medications? N -  Managing your Finances? N -  Housekeeping or managing your Housekeeping? Y -  Some recent data might be hidden    Cognitive Testing  Alert? Yes  Normal Appearance?Yes  Oriented to person? Yes  Place? Yes   Time? Yes  Recall of three objects?  Yes  Can perform simple calculations? Yes  Displays appropriate judgment?Yes  Can read the correct time from a watch face?Yes  EOL planning: Does Patient Have a Medical Advance Directive?: Yes Type of Advance Directive: Out of facility DNR (pink MOST or yellow form) Does patient want to make changes to medical advance directive?: No - Patient declined  Has papers at home but needs filled out/notarized, she is DNR/DNI in the hospital  Objective:   Today's Vitals   11/10/17 1530  BP: 132/82  Pulse: 99  Resp: 20  Temp: (!) 97.5 F (36.4 C)  SpO2: (!) 78%  Weight: 262 lb 3.2 oz (118.9 kg)  Height: 5\' 2"  (1.575 m)   Body mass index is 47.96 kg/m.   Wt Readings from Last 3  Encounters:  11/10/17 262 lb 3.2 oz (118.9 kg)  10/02/17 248 lb (112.5 kg)  09/02/17 249 lb 6.4 oz (113.1 kg)    General appearance: alert, no distress, WD/WN,  female HEENT: normocephalic, sclerae anicteric, TMs pearly, nares patent, no discharge or  erythema, pharynx normal Oral cavity: MMM, no lesions Neck: supple, no lymphadenopathy, no thyromegaly, no masses Heart: Irreg irreg, normal S1, S2, systolic murmur Lungs: decreased breath sounds, wheezing and possible rhonchi, no rales, 8 L O2 at 87% Abdomen: +bs, soft, non tender, non distended, no masses, no hepatomegaly, no splenomegaly Musculoskeletal: nontender, no swelling, no obvious deformity Extremities: no edema, no cyanosis, no clubbing Pulses: 2+ symmetric, upper and lower extremities, normal cap refill Neurological: alert, oriented x 3, CN2-12 intact, strength normal upper extremities and lower extremities,  DTRs 2+ throughout, no cerebellar signs, patient in wheelchair due to SOB Psychiatric: normal affect, behavior normal, pleasant  Breast: defer Gyn: defer Rectal: defer   Medicare Attestation I have personally reviewed: The patient's medical and social history Their use of alcohol, tobacco or illicit drugs Their current medications and supplements The patient's functional ability including ADLs,fall risks, home safety risks, cognitive, and hearing and visual impairment Diet and physical activities Evidence for depression or mood disorders  The patient's weight, height, BMI, and visual acuity have been recorded in the chart.  I have made referrals, counseling, and provided education to the patient based on review of the above and I have provided the patient with a written personalized care plan for preventive services.     [name], PA-C   11/10/2017

## 2017-11-10 ENCOUNTER — Encounter: Payer: Self-pay | Admitting: Physician Assistant

## 2017-11-10 ENCOUNTER — Ambulatory Visit (INDEPENDENT_AMBULATORY_CARE_PROVIDER_SITE_OTHER): Payer: PPO | Admitting: Physician Assistant

## 2017-11-10 VITALS — BP 132/82 | HR 99 | Temp 97.5°F | Resp 20 | Ht 62.0 in | Wt 262.2 lb

## 2017-11-10 DIAGNOSIS — Q248 Other specified congenital malformations of heart: Secondary | ICD-10-CM

## 2017-11-10 DIAGNOSIS — N183 Chronic kidney disease, stage 3 unspecified: Secondary | ICD-10-CM

## 2017-11-10 DIAGNOSIS — Z0001 Encounter for general adult medical examination with abnormal findings: Secondary | ICD-10-CM

## 2017-11-10 DIAGNOSIS — I700 Atherosclerosis of aorta: Secondary | ICD-10-CM

## 2017-11-10 DIAGNOSIS — R7309 Other abnormal glucose: Secondary | ICD-10-CM

## 2017-11-10 DIAGNOSIS — I272 Pulmonary hypertension, unspecified: Secondary | ICD-10-CM

## 2017-11-10 DIAGNOSIS — D509 Iron deficiency anemia, unspecified: Secondary | ICD-10-CM

## 2017-11-10 DIAGNOSIS — Z79899 Other long term (current) drug therapy: Secondary | ICD-10-CM

## 2017-11-10 DIAGNOSIS — I5032 Chronic diastolic (congestive) heart failure: Secondary | ICD-10-CM | POA: Diagnosis not present

## 2017-11-10 DIAGNOSIS — R0600 Dyspnea, unspecified: Secondary | ICD-10-CM

## 2017-11-10 DIAGNOSIS — E7800 Pure hypercholesterolemia, unspecified: Secondary | ICD-10-CM

## 2017-11-10 DIAGNOSIS — J449 Chronic obstructive pulmonary disease, unspecified: Secondary | ICD-10-CM | POA: Diagnosis not present

## 2017-11-10 DIAGNOSIS — I10 Essential (primary) hypertension: Secondary | ICD-10-CM

## 2017-11-10 DIAGNOSIS — I420 Dilated cardiomyopathy: Secondary | ICD-10-CM | POA: Diagnosis not present

## 2017-11-10 DIAGNOSIS — I471 Supraventricular tachycardia, unspecified: Secondary | ICD-10-CM

## 2017-11-10 DIAGNOSIS — Z0000 Encounter for general adult medical examination without abnormal findings: Secondary | ICD-10-CM

## 2017-11-10 DIAGNOSIS — Z720 Tobacco use: Secondary | ICD-10-CM

## 2017-11-10 DIAGNOSIS — G4733 Obstructive sleep apnea (adult) (pediatric): Secondary | ICD-10-CM

## 2017-11-10 DIAGNOSIS — Z66 Do not resuscitate: Secondary | ICD-10-CM

## 2017-11-10 DIAGNOSIS — R6889 Other general symptoms and signs: Secondary | ICD-10-CM

## 2017-11-10 DIAGNOSIS — E039 Hypothyroidism, unspecified: Secondary | ICD-10-CM

## 2017-11-10 DIAGNOSIS — I4892 Unspecified atrial flutter: Secondary | ICD-10-CM

## 2017-11-10 DIAGNOSIS — K219 Gastro-esophageal reflux disease without esophagitis: Secondary | ICD-10-CM

## 2017-11-10 DIAGNOSIS — E559 Vitamin D deficiency, unspecified: Secondary | ICD-10-CM

## 2017-11-10 DIAGNOSIS — Q238 Other congenital malformations of aortic and mitral valves: Secondary | ICD-10-CM

## 2017-11-10 DIAGNOSIS — Z860100 Personal history of colon polyps, unspecified: Secondary | ICD-10-CM

## 2017-11-10 DIAGNOSIS — Q2388 Other congenital malformations of aortic and mitral valves: Secondary | ICD-10-CM

## 2017-11-10 MED ORDER — ALBUTEROL SULFATE (2.5 MG/3ML) 0.083% IN NEBU: 2.5000 mg | INHALATION_SOLUTION | Freq: Four times a day (QID) | RESPIRATORY_TRACT | 12 refills | Status: AC | PRN

## 2017-11-10 MED ORDER — PREDNISONE 20 MG PO TABS: ORAL_TABLET | ORAL | 0 refills | Status: DC

## 2017-11-10 NOTE — Patient Instructions (Addendum)
NEED TO GET SLEEP STUDY She is on Bipap at night   Do the following things EVERYDAY: 1) Weigh yourself in the morning before breakfast or at the same time every day. Write it down and keep it in a log. 2) Take your medicines as prescribed 3) Eat low salt foods-Limit salt (sodium) to 2000 mg per day. Best thing to do is avoid processed foods.   4) Stay as active as you can everyday 5) Limit all fluids for the day to less than 1.5 liters  Call your doctor if:  Anytime you have any of the following symptoms:  1) 2 pound weight gain in 24 hours or 5 pounds in 1 week  2) shortness of breath, with or without a dry hacking cough  3) swelling in the hands, LEGs, feet or stomach  4) if you have to sleep on extra pillows at night in order to breathe. 5) after laying down at night for 20-30 mins, you wake up short of breath.   These can all be signs of fluid overload.    Heart Failure Heart failure means your heart has trouble pumping blood. This makes it hard for your body to work well. Heart failure is usually a long-term (chronic) condition. You must take good care of yourself and follow your doctor's treatment plan. Follow these instructions at home:  Take your heart medicine as told by your doctor. ? Do not stop taking medicine unless your doctor tells you to. ? Do not skip any dose of medicine. ? Refill your medicines before they run out. ? Take other medicines only as told by your doctor or pharmacist.  Stay active if told by your doctor. The elderly and people with severe heart failure should talk with a doctor about physical activity.  Eat heart-healthy foods. Choose foods that are without trans fat and are low in saturated fat, cholesterol, and salt (sodium). This includes fresh or frozen fruits and vegetables, fish, lean meats, fat-free or low-fat dairy foods, whole grains, and high-fiber foods. Lentils and dried peas and beans (legumes) are also good choices.  Limit salt if told  by your doctor.  Cook in a healthy way. Roast, grill, broil, bake, poach, steam, or stir-fry foods.  Limit fluids as told by your doctor.  Weigh yourself every morning. Do this after you pee (urinate) and before you eat breakfast. Write down your weight to give to your doctor.  Take your blood pressure and write it down if your doctor tells you to.  Ask your doctor how to check your pulse. Check your pulse as told.  Lose weight if told by your doctor.  Stop smoking or chewing tobacco. Do not use gum or patches that help you quit without your doctor's approval.  Schedule and go to doctor visits as told.  Nonpregnant women should have no more than 1 drink a day. Men should have no more than 2 drinks a day. Talk to your doctor about drinking alcohol.  Stop illegal drug use.  Stay current with shots (immunizations).  Manage your health conditions as told by your doctor.  Learn to manage your stress.  Rest when you are tired.  If it is really hot outside: ? Avoid intense activities. ? Use air conditioning or fans, or get in a cooler place. ? Avoid caffeine and alcohol. ? Wear loose-fitting, lightweight, and light-colored clothing.  If it is really cold outside: ? Avoid intense activities. ? Layer your clothing. ? Wear mittens or gloves, a  hat, and a scarf when going outside. ? Avoid alcohol.  Learn about heart failure and get support as needed.  Get help to maintain or improve your quality of life and your ability to care for yourself as needed. Contact a doctor if:  You gain weight quickly.  You are more short of breath than usual.  You cannot do your normal activities.  You tire easily.  You cough more than normal, especially with activity.  You have any or more puffiness (swelling) in areas such as your hands, feet, ankles, or belly (abdomen).  You cannot sleep because it is hard to breathe.  You feel like your heart is beating fast (palpitations).  You  get dizzy or light-headed when you stand up. Get help right away if:  You have trouble breathing.  There is a change in mental status, such as becoming less alert or not being able to focus.  You have chest pain or discomfort.  You faint. This information is not intended to replace advice given to you by your health care provider. Make sure you discuss any questions you have with your health care provider. Document Released: 06/11/2008 Document Revised: 02/08/2016 Document Reviewed: 10/19/2012 Elsevier Interactive Patient Education  2017 [employer].

## 2017-11-11 DIAGNOSIS — J449 Chronic obstructive pulmonary disease, unspecified: Secondary | ICD-10-CM | POA: Diagnosis not present

## 2017-11-11 DIAGNOSIS — J9610 Chronic respiratory failure, unspecified whether with hypoxia or hypercapnia: Secondary | ICD-10-CM | POA: Diagnosis not present

## 2017-11-11 LAB — MAGNESIUM: MAGNESIUM: 1.6 mg/dL (ref 1.5–2.5)

## 2017-11-11 LAB — BRAIN NATRIURETIC PEPTIDE: BRAIN NATRIURETIC PEPTIDE: 727 pg/mL — AB (ref <100)

## 2017-11-11 LAB — CBC WITH DIFFERENTIAL/PLATELET
Basophils Absolute: 17 cells/uL (ref 0–200)
Basophils Relative: 0.2 %
EOS ABS: 84 {cells}/uL (ref 15–500)
Eosinophils Relative: 1 %
HCT: 42.8 % (ref 35.0–45.0)
Hemoglobin: 12.5 g/dL (ref 11.7–15.5)
Lymphs Abs: 958 cells/uL (ref 850–3900)
MCH: 23.7 pg — AB (ref 27.0–33.0)
MCHC: 29.2 g/dL — ABNORMAL LOW (ref 32.0–36.0)
MCV: 81.1 fL (ref 80.0–100.0)
MONOS PCT: 9.2 %
MPV: 9.5 fL (ref 7.5–12.5)
NEUTROS PCT: 78.2 %
Neutro Abs: 6569 cells/uL (ref 1500–7800)
PLATELETS: 355 10*3/uL (ref 140–400)
RBC: 5.28 10*6/uL — ABNORMAL HIGH (ref 3.80–5.10)
RDW: 16.7 % — ABNORMAL HIGH (ref 11.0–15.0)
TOTAL LYMPHOCYTE: 11.4 %
WBC: 8.4 10*3/uL (ref 3.8–10.8)
WBCMIX: 773 {cells}/uL (ref 200–950)

## 2017-11-11 LAB — HEMOGLOBIN A1C
EAG (MMOL/L): 7.3 (calc)
Hgb A1c MFr Bld: 6.2 % of total Hgb — ABNORMAL HIGH (ref <5.7)
MEAN PLASMA GLUCOSE: 131 (calc)

## 2017-11-11 LAB — BASIC METABOLIC PANEL WITH GFR
BUN / CREAT RATIO: 24 (calc) — AB (ref 6–22)
BUN: 24 mg/dL (ref 7–25)
CHLORIDE: 100 mmol/L (ref 98–110)
CO2: 32 mmol/L (ref 20–32)
Calcium: 9.8 mg/dL (ref 8.6–10.4)
Creat: 1.01 mg/dL — ABNORMAL HIGH (ref 0.60–0.93)
GFR, EST AFRICAN AMERICAN: 65 mL/min/{1.73_m2} (ref >=60)
GFR, Est Non African American: 56 mL/min/{1.73_m2} — ABNORMAL LOW (ref >=60)
GLUCOSE: 129 mg/dL — AB (ref 65–99)
Potassium: 4.9 mmol/L (ref 3.5–5.3)
Sodium: 141 mmol/L (ref 135–146)

## 2017-11-11 LAB — HEPATIC FUNCTION PANEL
AG Ratio: 1.5 (calc) (ref 1.0–2.5)
ALT: 10 U/L (ref 6–29)
AST: 15 U/L (ref 10–35)
Albumin: 3.9 g/dL (ref 3.6–5.1)
Alkaline phosphatase (APISO): 74 U/L (ref 33–130)
BILIRUBIN DIRECT: 0.1 mg/dL (ref 0.0–0.2)
BILIRUBIN INDIRECT: 0.3 mg/dL (ref 0.2–1.2)
Globulin: 2.6 g/dL (calc) (ref 1.9–3.7)
TOTAL PROTEIN: 6.5 g/dL (ref 6.1–8.1)
Total Bilirubin: 0.4 mg/dL (ref 0.2–1.2)

## 2017-11-11 LAB — LIPID PANEL
CHOL/HDL RATIO: 3.1 (calc) (ref <5.0)
Cholesterol: 144 mg/dL (ref <200)
HDL: 47 mg/dL — ABNORMAL LOW (ref >50)
LDL CHOLESTEROL (CALC): 76 mg/dL
NON-HDL CHOLESTEROL (CALC): 97 mg/dL (ref <130)
Triglycerides: 127 mg/dL (ref <150)

## 2017-11-11 LAB — TSH: TSH: 4.48 mIU/L (ref 0.40–4.50)

## 2017-11-16 DIAGNOSIS — J449 Chronic obstructive pulmonary disease, unspecified: Secondary | ICD-10-CM | POA: Diagnosis not present

## 2017-11-17 ENCOUNTER — Ambulatory Visit: Payer: Self-pay | Admitting: Adult Health

## 2017-11-22 DIAGNOSIS — J449 Chronic obstructive pulmonary disease, unspecified: Secondary | ICD-10-CM | POA: Diagnosis not present

## 2017-11-24 ENCOUNTER — Ambulatory Visit: Payer: PPO | Admitting: Pulmonary Disease

## 2017-11-24 ENCOUNTER — Ambulatory Visit: Payer: Self-pay | Admitting: Physician Assistant

## 2017-11-25 ENCOUNTER — Telehealth: Payer: Self-pay | Admitting: *Deleted

## 2017-11-25 ENCOUNTER — Encounter: Payer: Self-pay | Admitting: Physician Assistant

## 2017-11-25 ENCOUNTER — Telehealth: Payer: Self-pay

## 2017-11-25 ENCOUNTER — Other Ambulatory Visit: Payer: Self-pay | Admitting: Physician Assistant

## 2017-11-25 NOTE — Telephone Encounter (Signed)
-----   Message from [name], [place] sent at 11/25/2017  3:32 PM EDT ----- Regarding: RE: CBD OIL The studies for CBD are better for seizure disorders than any other claims for CBD oil.  The biggest issues with it, is CBD oil is NOT regulated. A recent test of 18 over the counter CBD oil showed that 20% had TOO much, 60 % did not have the claimed amount, and 20 % had the claimed amount.   I have had some patients where it interacts with their medications as well and there is not a way for me to check the interactions since it is not a controlled substance.   With any other the counter medication OR supplement, if you try it, try to get from good source and if you have ANY abnormal symptoms over the next 3 months or don't see any benefits from it stop it.   The quality and quanity of the CBD oil varies greatly so I can not endorse patients taking it at this time.  ----- Message ----- From: Elenor Quinones, CMA Sent: 11/25/2017   2:18 PM To: [name], PA-C Subject: CBD OIL                                        Pt reports she that since she is no longer on coumadin, can she use the CBD oil under her tongue for her pain.   Please advise?

## 2017-11-25 NOTE — Progress Notes (Deleted)
Assessment and Plan:   COPD oxygen dependent -cont O2 -cont inhalers - due to severe COPD will change from symbicort to budesonide BID via nebulizer to improve pulmonary hygiene and increase expectoration, continue inhaler PRN and DUONEB TID at this time and will try to send to [facility]   Acute on chronic diastolic CHF (congestive heart failure) ([place])  continue to decrease weight and monitor at home -     BASIC METABOLIC PANEL WITH GFR  Pulmonary hypertension ([place]) Stressed importance of using bipap  Morbid Obesity with co morbidities - long discussion about weight loss, diet, and exercise   Continue diet and meds as discussed. Further disposition pending results of labs. Discussed med's effects and SE's.   Future Appointments  Date Time Provider [place]  11/26/2017  2:30 PM [name], PA-C [facility] None  12/16/2017  3:00 PM Unk Pinto, MD [facility] None  01/07/2018  2:30 PM Chesley Mires, MD [facility] None   HPI 72 y.o. female  presents for 2 week follow up for COPD, heart failure.   Patient was seen last OV with elevated BNP, increased weight and increased demand for O2. Her lasix was increased and she was given prednisone for possible COPD exacerbation.    Her blood pressure has been controlled at home, today their BP is  .She does not workout. She denies chest pain, dizziness.  Patient has severe end stage COPD with CO2 retention, she is on 5 L of O2 depending on activity level and on diamox for CO2 retention. She walks with walker or is in wheel chair, no falls.  She is unable to do house work due to her COPD and occ needs helps with ADLs. On Bipap NOT using currently.  She has Afib is on NOT on coumadin due to recent GI bleeds.   BMI is There is no height or weight on file to calculate BMI., she is working on diet and exercise. Wt Readings from Last 3 Encounters:  11/10/17 262 lb 3.2 oz (118.9 kg)  10/02/17 248 lb (112.5 kg)  09/02/17 249 lb 6.4 oz  (113.1 kg)    Current Medications:  Current Outpatient Medications on File Prior to Visit  Medication Sig Dispense Refill  . Acetaminophen (TYLENOL 8 HOUR ARTHRITIS PAIN PO) Take 1 tablet by mouth. PRN    . acetaminophen-codeine (TYLENOL #3) 300-30 MG tablet Take 1 tablet by mouth every 8 (eight) hours as needed for moderate pain or severe pain. 90 tablet 0  . acetaZOLAMIDE (DIAMOX) 250 MG tablet TAKE 1 TABLET (250 MG TOTAL) BY MOUTH 3 (THREE) TIMES DAILY. 270 tablet 1  . albuterol (PROVENTIL HFA;VENTOLIN HFA) 108 (90 Base) MCG/ACT inhaler Inhale 1-2 puffs into the lungs every 6 (six) hours as needed for wheezing or shortness of breath. 18 g 2  . albuterol (PROVENTIL) (2.5 MG/3ML) 0.083% nebulizer solution Take 3 mLs (2.5 mg total) by nebulization every 6 (six) hours as needed for wheezing or shortness of breath. 75 mL 12  . aspirin 81 MG tablet Take 81 mg by mouth daily.    Marland Kitchen atenolol (TENORMIN) 50 MG tablet Take 1 tablet (50 mg total) by mouth daily. 90 tablet 0  . budesonide (PULMICORT) 0.5 MG/2ML nebulizer solution Take 2 mLs (0.5 mg total) by nebulization 2 (two) times daily. 360 mL 3  . Cholecalciferol (VITAMIN D PO) Take 2,000 Units by mouth daily.     . diazepam (VALIUM) 5 MG tablet Take one tablet up to 2 x a daily for anxiety AS  NEEDED 60 tablet 0  . diltiazem (CARDIZEM CD) 300 MG 24 hr capsule TAKE 1 CAPSULE (300 MG TOTAL) BY MOUTH DAILY. 90 capsule 1  . DULoxetine (CYMBALTA) 60 MG capsule TAKE 1 CAPSULE BY MOUTH DAILY 90 capsule 1  . ferrous sulfate 325 (65 FE) MG tablet Take 325 mg by mouth 2 (two) times daily with a meal.    . fluticasone (FLONASE) 50 MCG/ACT nasal spray Place 2 sprays into both nostrils daily. (Patient taking differently: Place 2 sprays into both nostrils daily as needed for allergies. ) 16 g 0  . furosemide (LASIX) 80 MG tablet Take 1 tablet (80 mg total) by mouth daily. 90 tablet 0  . guaiFENesin (MUCINEX) 600 MG 12 hr tablet Take 600 mg by mouth 2 (two) times  daily.     Marland Kitchen ipratropium (ATROVENT) 0.02 % nebulizer solution Take 2.5 mLs (0.5 mg total) by nebulization every 6 (six) hours. 75 mL 12  . levothyroxine (SYNTHROID, LEVOTHROID) 175 MCG tablet TAKE 1 TABLET (175 MCG TOTAL) BY MOUTH DAILY BEFORE BREAKFAST. 90 tablet 1  . loratadine-pseudoephedrine (CLARITIN-D 24-HOUR) 10-240 MG per 24 hr tablet Take 1 tablet by mouth as needed for allergies.     . Magnesium Chloride (MAGNESIUM DR PO) Take 1 capsule by mouth daily.     . metFORMIN (GLUCOPHAGE-XR) 500 MG 24 hr tablet TAKE 1 TABLET (500 MG TOTAL) BY MOUTH 4 (FOUR) TIMES DAILY - AFTER MEALS AND AT BEDTIME. 360 tablet 1  . OXYGEN-HELIUM IN Inhale 5-6 L into the lungs continuous.     . pantoprazole (PROTONIX) 40 MG tablet Take 1 tablet (40 mg total) by mouth daily at 6 (six) AM. 90 tablet 0  . polyethylene glycol (MIRALAX / GLYCOLAX) packet Take 17 g by mouth 2 (two) times daily. 14 each 0  . pravastatin (PRAVACHOL) 40 MG tablet TAKE 1 TABLET (40 MG TOTAL) BY MOUTH DAILY. 90 tablet 1  . predniSONE (DELTASONE) 20 MG tablet 2 tablets daily for 3 days, 1 tablet daily for 4 days. 10 tablet 0   No current facility-administered medications on file prior to visit.    Medical History:  Past Medical History:  Diagnosis Date  . Arthritis   . CHF (congestive heart failure) ([place])   . COPD (chronic obstructive pulmonary disease) ([place])   . Depression   . Diabetes mellitus type 2 in obese ([place])   . GERD (gastroesophageal reflux disease)   . Hyperlipidemia   . Hypertension   . Morbid obesity ([place])   . OAB (overactive bladder)   . PSVT (paroxysmal supraventricular tachycardia) ([place])   . Thyroid disease   . Vitamin D deficiency    Allergies:  Allergies  Allergen Reactions  . Inderal [Propranolol] Other (See Comments)    Hair loss  . Ace Inhibitors Cough     Review of Systems:  Review of Systems  Constitutional: Negative for chills, fever and malaise/fatigue.  HENT: Negative for congestion, ear pain  and sore throat.   Eyes: Negative.   Respiratory: Negative for cough, shortness of breath and wheezing.   Cardiovascular: Negative for chest pain, palpitations and leg swelling.  Gastrointestinal: Negative for abdominal pain, blood in stool, constipation, diarrhea, heartburn and melena.  Genitourinary: Negative.   Skin: Negative.   Neurological: Negative for dizziness, sensory change, loss of consciousness and headaches.  Psychiatric/Behavioral: Negative for depression, hallucinations, memory loss, substance abuse and suicidal ideas. The patient is nervous/anxious. The patient does not have insomnia.     Family history- Review  and unchanged  Social history- Review and unchanged  Physical Exam: There were no vitals taken for this visit. Wt Readings from Last 3 Encounters:  11/10/17 262 lb 3.2 oz (118.9 kg)  10/02/17 248 lb (112.5 kg)  09/02/17 249 lb 6.4 oz (113.1 kg)   General appearance: alert, no distress, WD/WN,  female HEENT: normocephalic, sclerae anicteric, TMs pearly, nares patent, no discharge or erythema, pharynx normal Oral cavity: MMM, no lesions Neck: supple, no lymphadenopathy, no thyromegaly, no masses Heart: Irreg irreg, normal S1, S2, systolic murmur Lungs: decreased breath sounds, no wheezing no rhonchi, no rales, 5 L O2  Abdomen: +bs, soft, non tender, non distended, no masses, no hepatomegaly, no splenomegaly Musculoskeletal: nontender, no swelling, no obvious deformity Extremities: no edema at this time, no cyanosis, no clubbing Pulses: 2+ symmetric, upper and lower extremities, normal cap refill Neurological: alert, oriented x 3, CN2-12 intact, strength normal upper extremities and lower extremities,  DTRs 2+ throughout, no cerebellar signs, patient in wheelchair due to SOB Psychiatric: normal affect, behavior normal, patient crying and clearly upset about her husband richard in the hospital.    [name], PA-C 7:42 AM [facility]  [facility] Medicine

## 2017-11-25 NOTE — Telephone Encounter (Signed)
Patient's son called and reported the patient's 02 was at 72 LPM and she was still having difficulty breathing.  Patient was advised to go to the ED.

## 2017-11-25 NOTE — Telephone Encounter (Signed)
Pt  Has been made aware of facts about CBD oil & a MyChart message was sent as well. Ptt's caregiver voiced understanding.

## 2017-11-26 ENCOUNTER — Ambulatory Visit: Payer: Self-pay | Admitting: Physician Assistant

## 2017-11-26 ENCOUNTER — Telehealth: Payer: Self-pay | Admitting: *Deleted

## 2017-11-26 NOTE — Telephone Encounter (Signed)
[name], the patient's son, called and reported the patient's O2 is on 10 LPM and her O2 sat is less than 80%. Per Dr Melford Aase, the patient must go to the ED. The son is aware.

## 2017-11-26 NOTE — Progress Notes (Signed)
Assessment and Plan:   COPD oxygen dependent - will try to get pulmicort nebulizer - duoneb DAILY - keep O2 at 90, if less than 88 on 8 L need to go to ER -cont O2 -cont inhalers  Acute on chronic diastolic CHF (congestive heart failure) ([facility])  increase lasix to 160mg  daily for 3-5 days Decrease water Decrease salt/sugar Monitor weight closely Elevate legs STRICT ER PRECAUTIONS -     BASIC METABOLIC PANEL WITH GFR  Pulmonary hypertension ([facility]) Stressed importance of using bipap  Morbid Obesity with co morbidities - long discussion about weight loss, diet, and exercise  LONG DISCUSSION WITH PATIENT AND SON ABOUT COMPLIANCE AND SERIOUSNESS OF COMORBID CONDITIONS. SHE PREFERS TO NO GO TO THE ER/HOSPITAL AND SINCE HER O2 IS GOOD WE WILL TRY TO TREAT OUTPATIENT BUT THEY UNDERSTAND THE RISK OF DEATH/COMPLCIATIONS.  DISCUSSED HOSPICE WHICH PATIENT STARTED CRYING WITH DISCUSSING BUT EXPLAINED THE BEST WAY TO KEEP HER AT HOME WOULD BE WITH HOSPICE STRICT ER PRECAUTIONS DISCUSSED WITH SON/PATIENT.  CLOSE FOLLOW UP 1 WEEK  Continue diet and meds as discussed. Further disposition pending results of labs. Discussed med's effects and SE's.  OVER 40 mins was spent with patient and son  Future Appointments  Date Time Provider [place]  12/16/2017  3:00 PM Unk Pinto, MD [facility] None  01/07/2018  2:30 PM Chesley Mires, MD [facility] None   HPI 72 y.o. female  presents for 2 week follow up for COPD, heart failure.   Patient was seen last OV with elevated BNP, increased weight and increased demand for O2. Her lasix was increased and she was given prednisone for possible COPD exacerbation.  She is on 8 L O2 and her O2 is 90%. She has NOT increase her lasix, she is only on 1 a day. She does not have budesonide, just albuterol/ipatropium.   She is very tired, states she can not sleep due to pain and SOB. She has decreased appetite. She has pain bilateral legs, shoulders and hips.   Wt Readings from Last 3 Encounters:  11/27/17 268 lb (121.6 kg)  11/10/17 262 lb 3.2 oz (118.9 kg)  10/02/17 248 lb (112.5 kg)   Lab Results  Component Value Date   CREATININE 1.01 (H) 11/10/2017   BUN 24 11/10/2017   NA 141 11/10/2017   K 4.9 11/10/2017   CL 100 11/10/2017   CO2 32 11/10/2017    Her blood pressure has been controlled at home, today their BP is BP: 130/80.She does not workout. She denies chest pain, dizziness.  Patient has severe end stage COPD with CO2 retention, she is on 5 L of O2 depending on activity level and on diamox for CO2 retention. She walks with walker or is in wheel chair, no falls.  She is unable to do house work due to her COPD and occ needs helps with ADLs. On Bipap.   She has Afib is on NOT on coumadin due to recent GI bleeds.   BMI is Body mass index is 49.02 kg/m., she is working on diet and exercise. Wt Readings from Last 3 Encounters:  11/27/17 268 lb (121.6 kg)  11/10/17 262 lb 3.2 oz (118.9 kg)  10/02/17 248 lb (112.5 kg)    Current Medications:  Current Outpatient Medications on File Prior to Visit  Medication Sig Dispense Refill  . Acetaminophen (TYLENOL 8 HOUR ARTHRITIS PAIN PO) Take 1 tablet by mouth. PRN    . acetaminophen-codeine (TYLENOL #3) 300-30 MG tablet Take 1 tablet by  mouth every 8 (eight) hours as needed for moderate pain or severe pain. 90 tablet 0  . acetaZOLAMIDE (DIAMOX) 250 MG tablet TAKE 1 TABLET (250 MG TOTAL) BY MOUTH 3 (THREE) TIMES DAILY. 270 tablet 1  . albuterol (PROVENTIL HFA;VENTOLIN HFA) 108 (90 Base) MCG/ACT inhaler Inhale 1-2 puffs into the lungs every 6 (six) hours as needed for wheezing or shortness of breath. 18 g 2  . albuterol (PROVENTIL) (2.5 MG/3ML) 0.083% nebulizer solution Take 3 mLs (2.5 mg total) by nebulization every 6 (six) hours as needed for wheezing or shortness of breath. 75 mL 12  . aspirin 81 MG tablet Take 81 mg by mouth daily.    Marland Kitchen atenolol (TENORMIN) 50 MG tablet Take 1 tablet (50 mg  total) by mouth daily. 90 tablet 0  . budesonide (PULMICORT) 0.5 MG/2ML nebulizer solution Take 2 mLs (0.5 mg total) by nebulization 2 (two) times daily. 360 mL 3  . Cholecalciferol (VITAMIN D PO) Take 2,000 Units by mouth daily.     . diazepam (VALIUM) 5 MG tablet Take one tablet up to 2 x a daily for anxiety AS NEEDED 60 tablet 0  . diltiazem (CARDIZEM CD) 300 MG 24 hr capsule TAKE 1 CAPSULE (300 MG TOTAL) BY MOUTH DAILY. 90 capsule 1  . DULoxetine (CYMBALTA) 60 MG capsule TAKE 1 CAPSULE BY MOUTH DAILY 90 capsule 1  . ferrous sulfate 325 (65 FE) MG tablet Take 325 mg by mouth 2 (two) times daily with a meal.    . fluticasone (FLONASE) 50 MCG/ACT nasal spray Place 2 sprays into both nostrils daily. (Patient taking differently: Place 2 sprays into both nostrils daily as needed for allergies. ) 16 g 0  . furosemide (LASIX) 80 MG tablet Take 1 tablet (80 mg total) by mouth daily. 90 tablet 0  . guaiFENesin (MUCINEX) 600 MG 12 hr tablet Take 600 mg by mouth 2 (two) times daily.     Marland Kitchen ipratropium (ATROVENT) 0.02 % nebulizer solution Take 2.5 mLs (0.5 mg total) by nebulization every 6 (six) hours. 75 mL 12  . levothyroxine (SYNTHROID, LEVOTHROID) 175 MCG tablet TAKE 1 TABLET (175 MCG TOTAL) BY MOUTH DAILY BEFORE BREAKFAST. 90 tablet 1  . loratadine-pseudoephedrine (CLARITIN-D 24-HOUR) 10-240 MG per 24 hr tablet Take 1 tablet by mouth as needed for allergies.     . Magnesium Chloride (MAGNESIUM DR PO) Take 1 capsule by mouth daily.     . metFORMIN (GLUCOPHAGE-XR) 500 MG 24 hr tablet TAKE 1 TABLET (500 MG TOTAL) BY MOUTH 4 (FOUR) TIMES DAILY - AFTER MEALS AND AT BEDTIME. 360 tablet 1  . OXYGEN-HELIUM IN Inhale 5-6 L into the lungs continuous.     . pantoprazole (PROTONIX) 40 MG tablet Take 1 tablet (40 mg total) by mouth daily at 6 (six) AM. 90 tablet 0  . polyethylene glycol (MIRALAX / GLYCOLAX) packet Take 17 g by mouth 2 (two) times daily. 14 each 0  . pravastatin (PRAVACHOL) 40 MG tablet TAKE 1 TABLET  BY MOUTH EVERY DAY 90 tablet 1   No current facility-administered medications on file prior to visit.    Medical History:  Past Medical History:  Diagnosis Date  . Arthritis   . CHF (congestive heart failure) ([place])   . COPD (chronic obstructive pulmonary disease) ([place])   . Depression   . Diabetes mellitus type 2 in obese ([place])   . GERD (gastroesophageal reflux disease)   . Hyperlipidemia   . Hypertension   . Morbid obesity ([place])   .  OAB (overactive bladder)   . PSVT (paroxysmal supraventricular tachycardia) ([place])   . Thyroid disease   . Vitamin D deficiency    Allergies:  Allergies  Allergen Reactions  . Inderal [Propranolol] Other (See Comments)    Hair loss  . Ace Inhibitors Cough     Review of Systems:  Review of Systems  Constitutional: Positive for malaise/fatigue. Negative for chills and fever.  HENT: Negative for congestion, ear pain and sore throat.   Eyes: Negative.   Respiratory: Positive for shortness of breath. Negative for cough and wheezing.   Cardiovascular: Positive for leg swelling. Negative for chest pain and palpitations.  Gastrointestinal: Negative for abdominal pain, blood in stool, constipation, diarrhea, heartburn and melena.  Genitourinary: Negative.   Skin: Negative.   Neurological: Positive for weakness. Negative for dizziness, sensory change, loss of consciousness and headaches.  Psychiatric/Behavioral: Negative for depression, hallucinations, memory loss, substance abuse and suicidal ideas. The patient is nervous/anxious. The patient does not have insomnia.     Family history- Review and unchanged  Social history- Review and unchanged  Physical Exam: BP 130/80   Pulse (!) 110   Temp 97.6 F (36.4 C)   Resp 18   Ht 5\' 2"  (1.575 m)   Wt 268 lb (121.6 kg)   SpO2 (!) 83%   BMI 49.02 kg/m  Wt Readings from Last 3 Encounters:  11/27/17 268 lb (121.6 kg)  11/10/17 262 lb 3.2 oz (118.9 kg)  10/02/17 248 lb (112.5 kg)   General  appearance: alert, no distress, WD/WN,  female HEENT: normocephalic, sclerae anicteric, TMs pearly, nares patent, no discharge or erythema, pharynx normal Oral cavity: MMM, no lesions Neck: supple, no lymphadenopathy, no thyromegaly, no masses Heart: Irreg irreg, normal S1, S2, systolic murmur Lungs: decreased breath sounds, no wheezing no rhonchi, no rales, 8 L O2  Abdomen: +bs, soft, non tender, non distended, no masses, no hepatomegaly, no splenomegaly Musculoskeletal: nontender, no swelling, no obvious deformity Extremities: 3-4+pitting  EDEMA, + dependent rubor, bilateral legs with erythema, no warmth, no weeping.  Pulses: 2+ symmetric, upper and lower extremities, normal cap refill Neurological: alert, oriented x 3, CN2-12 intact, strength normal upper extremities and lower extremities,  DTRs 2+ throughout, no cerebellar signs, patient in wheelchair due to SOB Psychiatric: normal affect, behavior normal   [name], PA-C 2:49 PM [facility] Medicine

## 2017-11-27 ENCOUNTER — Ambulatory Visit (INDEPENDENT_AMBULATORY_CARE_PROVIDER_SITE_OTHER): Payer: PPO | Admitting: Physician Assistant

## 2017-11-27 ENCOUNTER — Encounter: Payer: Self-pay | Admitting: Physician Assistant

## 2017-11-27 VITALS — BP 130/80 | HR 110 | Temp 97.6°F | Resp 18 | Ht 62.0 in | Wt 268.0 lb

## 2017-11-27 DIAGNOSIS — Z79899 Other long term (current) drug therapy: Secondary | ICD-10-CM | POA: Diagnosis not present

## 2017-11-27 LAB — HEPATIC FUNCTION PANEL
AG RATIO: 1.8 (calc) (ref 1.0–2.5)
ALBUMIN MSPROF: 3.8 g/dL (ref 3.6–5.1)
ALT: 10 U/L (ref 6–29)
AST: 16 U/L (ref 10–35)
Alkaline phosphatase (APISO): 67 U/L (ref 33–130)
BILIRUBIN INDIRECT: 0.5 mg/dL (ref 0.2–1.2)
Bilirubin, Direct: 0.3 mg/dL — ABNORMAL HIGH (ref 0.0–0.2)
Globulin: 2.1 g/dL (calc) (ref 1.9–3.7)
TOTAL PROTEIN: 5.9 g/dL — AB (ref 6.1–8.1)
Total Bilirubin: 0.8 mg/dL (ref 0.2–1.2)

## 2017-11-27 LAB — CBC WITH DIFFERENTIAL/PLATELET
BASOS PCT: 0.2 %
Basophils Absolute: 17 cells/uL (ref 0–200)
EOS ABS: 94 {cells}/uL (ref 15–500)
Eosinophils Relative: 1.1 %
HCT: 43.5 % (ref 35.0–45.0)
HEMOGLOBIN: 12.9 g/dL (ref 11.7–15.5)
Lymphs Abs: 672 cells/uL — ABNORMAL LOW (ref 850–3900)
MCH: 23.6 pg — ABNORMAL LOW (ref 27.0–33.0)
MCHC: 29.7 g/dL — ABNORMAL LOW (ref 32.0–36.0)
MCV: 79.7 fL — ABNORMAL LOW (ref 80.0–100.0)
MONOS PCT: 7.8 %
MPV: 10.6 fL (ref 7.5–12.5)
NEUTROS ABS: 7055 {cells}/uL (ref 1500–7800)
Neutrophils Relative %: 83 %
PLATELETS: 264 10*3/uL (ref 140–400)
RBC: 5.46 10*6/uL — AB (ref 3.80–5.10)
RDW: 16.6 % — ABNORMAL HIGH (ref 11.0–15.0)
TOTAL LYMPHOCYTE: 7.9 %
WBC: 8.5 10*3/uL (ref 3.8–10.8)
WBCMIX: 663 {cells}/uL (ref 200–950)

## 2017-11-27 LAB — BASIC METABOLIC PANEL WITH GFR
BUN/Creatinine Ratio: 23 (calc) — ABNORMAL HIGH (ref 6–22)
BUN: 28 mg/dL — ABNORMAL HIGH (ref 7–25)
CALCIUM: 9.3 mg/dL (ref 8.6–10.4)
CHLORIDE: 100 mmol/L (ref 98–110)
CO2: 33 mmol/L — AB (ref 20–32)
CREATININE: 1.22 mg/dL — AB (ref 0.60–0.93)
GFR, Est African American: 52 mL/min/{1.73_m2} — ABNORMAL LOW (ref >=60)
GFR, Est Non African American: 45 mL/min/{1.73_m2} — ABNORMAL LOW (ref >=60)
GLUCOSE: 122 mg/dL — AB (ref 65–99)
Potassium: 4.3 mmol/L (ref 3.5–5.3)
Sodium: 142 mmol/L (ref 135–146)

## 2017-11-27 LAB — MAGNESIUM: MAGNESIUM: 1.6 mg/dL (ref 1.5–2.5)

## 2017-11-27 NOTE — Patient Instructions (Addendum)
Make sure she is doing pulmicort twice a day in nebulizer NO MATTER WHAT Make sure she is doing albuterol/ipatromcium twice a day no matter water Can do albuterol/ipatropium up to every 4 hours AS needed for shortness of breath  Increase lasix to 2 pills at one time or 160mg  at one time for 5 days Weigh self daily- call if more than 5 lbs weight loss in a day Call if weight does not go down Elevate your legs.   ANY WORSENING SHORTNESS OF BREATH, ANY CONFUSION, IF O2 STAYS BELOW 88% ON 8 L, IF ANY CHEST PAIN, IF WEIGHT DOES NOT GO DOWN, GO TO THE HOSPITAL.  Call if she gets warmth in her legs, will send in antibiotic.   Follow up 1 week   Heart Failure Heart failure means your heart has trouble pumping blood. This makes it hard for your body to work well. Heart failure is usually a long-term (chronic) condition. You must take good care of yourself and follow your doctor's treatment plan. Follow these instructions at home:  Take your heart medicine as told by your doctor. ? Do not stop taking medicine unless your doctor tells you to. ? Do not skip any dose of medicine. ? Refill your medicines before they run out. ? Take other medicines only as told by your doctor or pharmacist.  Stay active if told by your doctor. The elderly and people with severe heart failure should talk with a doctor about physical activity.  Eat heart-healthy foods. Choose foods that are without trans fat and are low in saturated fat, cholesterol, and salt (sodium). This includes fresh or frozen fruits and vegetables, fish, lean meats, fat-free or low-fat dairy foods, whole grains, and high-fiber foods. Lentils and dried peas and beans (legumes) are also good choices.  Limit salt if told by your doctor.  Cook in a healthy way. Roast, grill, broil, bake, poach, steam, or stir-fry foods.  Limit fluids as told by your doctor.  Weigh yourself every morning. Do this after you pee (urinate) and before you eat  breakfast. Write down your weight to give to your doctor.  Take your blood pressure and write it down if your doctor tells you to.  Ask your doctor how to check your pulse. Check your pulse as told.  Lose weight if told by your doctor.  Stop smoking or chewing tobacco. Do not use gum or patches that help you quit without your doctor's approval.  Schedule and go to doctor visits as told.  Nonpregnant women should have no more than 1 drink a day. Men should have no more than 2 drinks a day. Talk to your doctor about drinking alcohol.  Stop illegal drug use.  Stay current with shots (immunizations).  Manage your health conditions as told by your doctor.  Learn to manage your stress.  Rest when you are tired.  If it is really hot outside: ? Avoid intense activities. ? Use air conditioning or fans, or get in a cooler place. ? Avoid caffeine and alcohol. ? Wear loose-fitting, lightweight, and light-colored clothing.  If it is really cold outside: ? Avoid intense activities. ? Layer your clothing. ? Wear mittens or gloves, a hat, and a scarf when going outside. ? Avoid alcohol.  Learn about heart failure and get support as needed.  Get help to maintain or improve your quality of life and your ability to care for yourself as needed. Contact a doctor if:  You gain weight quickly.  You are  more short of breath than usual.  You cannot do your normal activities.  You tire easily.  You cough more than normal, especially with activity.  You have any or more puffiness (swelling) in areas such as your hands, feet, ankles, or belly (abdomen).  You cannot sleep because it is hard to breathe.  You feel like your heart is beating fast (palpitations).  You get dizzy or light-headed when you stand up. Get help right away if:  You have trouble breathing.  There is a change in mental status, such as becoming less alert or not being able to focus.  You have chest pain or  discomfort.  You faint. This information is not intended to replace advice given to you by your health care provider. Make sure you discuss any questions you have with your health care provider. Document Released: 06/11/2008 Document Revised: 02/08/2016 Document Reviewed: 10/19/2012 Elsevier Interactive Patient Education  2017 [employer].

## 2017-11-28 NOTE — Progress Notes (Signed)
Per [name], Faxed new order for Pulmicort to [facility]. Scanned order into [employer] tab. Advised patient. Patient states Pulmicort was too expensive previously (2018). Will request [place] file with Medicare part B, to see if it covers for dx J44.9.

## 2017-11-28 NOTE — Progress Notes (Signed)
Pulmicort not covered by part D. Rx sent to [facility].

## 2017-11-28 NOTE — Progress Notes (Signed)
Pulmicort not covered by Medicare Part B

## 2017-12-02 NOTE — Progress Notes (Signed)
Assessment and Plan:   COPD oxygen dependent - will try to get pulmicort nebulizer - duoneb DAILY BID AT LEAST, more if short of breath - keep O2 at 90, if less than 88 on 8 L need to go to ER -cont O2  -cont inhalers - will try to get hospital bed for COPD and CHF and arthrits - will refer for RT  Acute on chronic diastolic CHF (congestive heart failure) (HCC) Continue lasix to 160mg - ? Need to switch to demadex Follow up cardiology Decrease water Decrease salt/sugar Monitor weight closely- CALL Friday WITH WEIGHT UPDATE and MONDAY Elevate legs Follow up 1 week STRICT ER PRECAUTIONS -     BASIC METABOLIC PANEL WITH GFR  Pulmonary hypertension ([place]) Stressed importance of using bipap  Morbid Obesity with co morbidities - long discussion about weight loss, diet, and exercise  Arthritis Right shoulder pain Bilateral leg pain Will refer to homes health, difficult for patient to leave the house Refer ortho- ? Benefit from injections- do not want to give narcotics/pain meds with COPD  PATIENT WAS 30 MINS LATE TO OV, STILL SPENT OVER 45- 60 MINS WITH PATIENT AND SON   Continue diet and meds as discussed. Further disposition pending results of labs. Discussed med's effects and SE's.  OVER 40 mins was spent with patient and son  Future Appointments  Date Time Provider [place]  12/11/2017  4:00 PM [name], PA-C [facility] None  12/16/2017  3:00 PM Unk Pinto, MD [facility] None  01/07/2018  2:30 PM Chesley Mires, MD [facility] None   HPI 72 y.o. female  presents for 2 week follow up for COPD, heart failure.   Patient has severe end stage COPD with CO2 retention and diastolic heart failure.  Patient was seen last OV with elevated BNP, increased weight and increased demand for O2 from 5 to 8 L. Her lasix was increased last visit to 160mg  daily with strict ER precautions.   She does not have budesonide, just albuterol/ipatropium. She has lost 4 lbs, still on  8 L of 02.   She is very tired, states she can not sleep due to pain and SOB. She has decreased appetite. She has pain bilateral legs, shoulders and hips. She states it is hard to get up and down, hard to walk, hard to get out of bed. She states that she feels weak and painful. She does not see ortho. She does not have a hospital bed.  Patient has end stage COPD, CHF, arthritis which requires her to be positioned in ways that are not feasible with a normal bed. Head must be elevated at least 30 degrees or more, if not she has shortness of breath and chronic pain.   Wt Readings from Last 3 Encounters:  12/03/17 264 lb (119.7 kg)  11/27/17 268 lb (121.6 kg)  11/10/17 262 lb 3.2 oz (118.9 kg)   Lab Results  Component Value Date   CREATININE 1.22 (H) 11/27/2017   BUN 28 (H) 11/27/2017   NA 142 11/27/2017   K 4.3 11/27/2017   CL 100 11/27/2017   CO2 33 (H) 11/27/2017     Lab Results  Component Value Date   GFRNONAA 45 (L) 11/27/2017   Lab Results  Component Value Date   HGBA1C 6.2 (H) 11/10/2017     Her blood pressure has been controlled at home, today their BP is BP: 122/80.She does not workout. She denies chest pain, dizziness.  She has Afib is on NOT on coumadin due  to recent GI bleeds.   BMI is Body mass index is 48.29 kg/m., she is working on diet and exercise. Wt Readings from Last 3 Encounters:  12/03/17 264 lb (119.7 kg)  11/27/17 268 lb (121.6 kg)  11/10/17 262 lb 3.2 oz (118.9 kg)    Current Medications:  Current Outpatient Medications on File Prior to Visit  Medication Sig  . Acetaminophen (TYLENOL 8 HOUR ARTHRITIS PAIN PO) Take 1 tablet by mouth. PRN  . acetaminophen-codeine (TYLENOL #3) 300-30 MG tablet Take 1 tablet by mouth every 8 (eight) hours as needed for moderate pain or severe pain.  Marland Kitchen acetaZOLAMIDE (DIAMOX) 250 MG tablet TAKE 1 TABLET (250 MG TOTAL) BY MOUTH 3 (THREE) TIMES DAILY.  Marland Kitchen albuterol (PROVENTIL HFA;VENTOLIN HFA) 108 (90 Base) MCG/ACT inhaler  Inhale 1-2 puffs into the lungs every 6 (six) hours as needed for wheezing or shortness of breath.  Marland Kitchen albuterol (PROVENTIL) (2.5 MG/3ML) 0.083% nebulizer solution Take 3 mLs (2.5 mg total) by nebulization every 6 (six) hours as needed for wheezing or shortness of breath.  Marland Kitchen aspirin 81 MG tablet Take 81 mg by mouth daily.  Marland Kitchen atenolol (TENORMIN) 50 MG tablet Take 1 tablet (50 mg total) by mouth daily.  . budesonide (PULMICORT) 0.5 MG/2ML nebulizer solution Take 2 mLs (0.5 mg total) by nebulization 2 (two) times daily.  . Cholecalciferol (VITAMIN D PO) Take 2,000 Units by mouth daily.   . diazepam (VALIUM) 5 MG tablet Take one tablet up to 2 x a daily for anxiety AS NEEDED  . diltiazem (CARDIZEM CD) 300 MG 24 hr capsule TAKE 1 CAPSULE (300 MG TOTAL) BY MOUTH DAILY.  . DULoxetine (CYMBALTA) 60 MG capsule TAKE 1 CAPSULE BY MOUTH DAILY  . ferrous sulfate 325 (65 FE) MG tablet Take 325 mg by mouth 2 (two) times daily with a meal.  . fluticasone (FLONASE) 50 MCG/ACT nasal spray Place 2 sprays into both nostrils daily. (Patient taking differently: Place 2 sprays into both nostrils daily as needed for allergies. )  . furosemide (LASIX) 80 MG tablet Take 1 tablet (80 mg total) by mouth daily.  Marland Kitchen guaiFENesin (MUCINEX) 600 MG 12 hr tablet Take 600 mg by mouth 2 (two) times daily.   Marland Kitchen ipratropium (ATROVENT) 0.02 % nebulizer solution Take 2.5 mLs (0.5 mg total) by nebulization every 6 (six) hours.  Marland Kitchen levothyroxine (SYNTHROID, LEVOTHROID) 175 MCG tablet TAKE 1 TABLET (175 MCG TOTAL) BY MOUTH DAILY BEFORE BREAKFAST.  Marland Kitchen loratadine-pseudoephedrine (CLARITIN-D 24-HOUR) 10-240 MG per 24 hr tablet Take 1 tablet by mouth as needed for allergies.   . Magnesium Chloride (MAGNESIUM DR PO) Take 1 capsule by mouth daily.   . metFORMIN (GLUCOPHAGE-XR) 500 MG 24 hr tablet TAKE 1 TABLET (500 MG TOTAL) BY MOUTH 4 (FOUR) TIMES DAILY - AFTER MEALS AND AT BEDTIME.  Marland Kitchen OXYGEN-HELIUM IN Inhale 5-6 L into the lungs continuous.   .  pantoprazole (PROTONIX) 40 MG tablet Take 1 tablet (40 mg total) by mouth daily at 6 (six) AM.  . polyethylene glycol (MIRALAX / GLYCOLAX) packet Take 17 g by mouth 2 (two) times daily.  . pravastatin (PRAVACHOL) 40 MG tablet TAKE 1 TABLET BY MOUTH EVERY DAY   No current facility-administered medications on file prior to visit.    Medical History:  Past Medical History:  Diagnosis Date  . Arthritis   . CHF (congestive heart failure) ([place])   . COPD (chronic obstructive pulmonary disease) ([place])   . Depression   . Diabetes mellitus type 2  in obese ([place])   . GERD (gastroesophageal reflux disease)   . Hyperlipidemia   . Hypertension   . Morbid obesity ([place])   . OAB (overactive bladder)   . PSVT (paroxysmal supraventricular tachycardia) ([place])   . Thyroid disease   . Vitamin D deficiency    Allergies:  Allergies  Allergen Reactions  . Inderal [Propranolol] Other (See Comments)    Hair loss  . Ace Inhibitors Cough     Review of Systems:  Review of Systems  Constitutional: Positive for malaise/fatigue. Negative for chills and fever.  HENT: Negative for congestion, ear pain and sore throat.   Eyes: Negative.   Respiratory: Positive for shortness of breath. Negative for cough and wheezing.   Cardiovascular: Positive for leg swelling. Negative for chest pain and palpitations.  Gastrointestinal: Negative for abdominal pain, blood in stool, constipation, diarrhea, heartburn and melena.  Genitourinary: Negative.   Skin: Negative.   Neurological: Positive for weakness. Negative for dizziness, sensory change, loss of consciousness and headaches.  Psychiatric/Behavioral: Negative for depression, hallucinations, memory loss, substance abuse and suicidal ideas. The patient is nervous/anxious. The patient does not have insomnia.     Family history- Review and unchanged  Social history- Review and unchanged  Physical Exam: BP 122/80   Pulse 71   Temp 97.7 F (36.5 C)   Ht 5\' 2"  (1.575  m)   Wt 264 lb (119.7 kg)   SpO2 90%   BMI 48.29 kg/m  Wt Readings from Last 3 Encounters:  12/03/17 264 lb (119.7 kg)  11/27/17 268 lb (121.6 kg)  11/10/17 262 lb 3.2 oz (118.9 kg)   General appearance: alert, no distress, WD/WN,  female HEENT: normocephalic, sclerae anicteric, TMs pearly, nares patent, no discharge or erythema, pharynx normal Oral cavity: MMM, no lesions Neck: supple, no lymphadenopathy, no thyromegaly, no masses Heart: Irreg irreg, normal S1, S2, systolic murmur Lungs: decreased breath sounds, no wheezing no rhonchi, no rales, 8 L O2  Abdomen: +bs, soft, non tender, non distended, no masses, no hepatomegaly, no splenomegaly Musculoskeletal: nontender, no swelling, no obvious deformity Extremities: 3-4+pitting  EDEMA, + dependent rubor, bilateral legs with erythema, no warmth, no weeping.  Pulses: 2+ symmetric, upper and lower extremities, normal cap refill Neurological: alert, oriented x 3, CN2-12 intact, strength normal upper extremities and lower extremities,  DTRs 2+ throughout, no cerebellar signs, patient in wheelchair due to SOB Psychiatric: normal affect, behavior normal   [name], PA-C 5:30 PM [facility]

## 2017-12-03 ENCOUNTER — Telehealth: Payer: Self-pay | Admitting: Physician Assistant

## 2017-12-03 ENCOUNTER — Encounter: Payer: Self-pay | Admitting: Physician Assistant

## 2017-12-03 ENCOUNTER — Ambulatory Visit (INDEPENDENT_AMBULATORY_CARE_PROVIDER_SITE_OTHER): Payer: PPO | Admitting: Physician Assistant

## 2017-12-03 VITALS — BP 122/80 | HR 71 | Temp 97.7°F | Ht 62.0 in | Wt 264.0 lb

## 2017-12-03 DIAGNOSIS — M25511 Pain in right shoulder: Secondary | ICD-10-CM | POA: Diagnosis not present

## 2017-12-03 DIAGNOSIS — R29898 Other symptoms and signs involving the musculoskeletal system: Secondary | ICD-10-CM | POA: Diagnosis not present

## 2017-12-03 DIAGNOSIS — M5440 Lumbago with sciatica, unspecified side: Secondary | ICD-10-CM

## 2017-12-03 DIAGNOSIS — J449 Chronic obstructive pulmonary disease, unspecified: Secondary | ICD-10-CM

## 2017-12-03 DIAGNOSIS — G8929 Other chronic pain: Secondary | ICD-10-CM | POA: Diagnosis not present

## 2017-12-03 DIAGNOSIS — I420 Dilated cardiomyopathy: Secondary | ICD-10-CM

## 2017-12-03 NOTE — Patient Instructions (Addendum)
Monitor weight Bipap at night always and during the day IF she is short of breath  Continue lasix 160mg  at one time We are getting kidney fucntion We will send out respiratory therapy and physical therapy  We will get your hospital bed  Follow up 1 week  Call if weight does not go down Elevate your legs.   ANY WORSENING SHORTNESS OF BREATH, ANY CONFUSION, IF O2 STAYS BELOW 88% ON 8 L, IF ANY CHEST PAIN, IF WEIGHT DOES NOT GO DOWN, GO TO THE HOSPITAL.    Do the following things EVERYDAY: 1) Weigh yourself in the morning before breakfast or at the same time every day. Write it down and keep it in a log. 2) Take your medicines as prescribed 3) Eat low salt foods-Limit salt (sodium) to 2000 mg per day. Best thing to do is avoid processed foods.   4) Stay as active as you can everyday 5) Limit all fluids for the day to less than 1.5 liters  Call your doctor if:  Anytime you have any of the following symptoms:  1) 2 pound weight gain in 24 hours or 5 pounds in 1 week  2) shortness of breath, with or without a dry hacking cough  3) swelling in the hands, LEGs, feet or stomach  4) if you have to sleep on extra pillows at night in order to breathe. 5) after laying down at night for 20-30 mins, you wake up short of breath.   These can all be signs of fluid overload.    Heart Failure Heart failure means your heart has trouble pumping blood. This makes it hard for your body to work well. Heart failure is usually a long-term (chronic) condition. You must take good care of yourself and follow your doctor's treatment plan. Follow these instructions at home:  Take your heart medicine as told by your doctor. ? Do not stop taking medicine unless your doctor tells you to. ? Do not skip any dose of medicine. ? Refill your medicines before they run out. ? Take other medicines only as told by your doctor or pharmacist.  Stay active if told by your doctor. The elderly and people with severe  heart failure should talk with a doctor about physical activity.  Eat heart-healthy foods. Choose foods that are without trans fat and are low in saturated fat, cholesterol, and salt (sodium). This includes fresh or frozen fruits and vegetables, fish, lean meats, fat-free or low-fat dairy foods, whole grains, and high-fiber foods. Lentils and dried peas and beans (legumes) are also good choices.  Limit salt if told by your doctor.  Cook in a healthy way. Roast, grill, broil, bake, poach, steam, or stir-fry foods.  Limit fluids as told by your doctor.  Weigh yourself every morning. Do this after you pee (urinate) and before you eat breakfast. Write down your weight to give to your doctor.  Take your blood pressure and write it down if your doctor tells you to.  Ask your doctor how to check your pulse. Check your pulse as told.  Lose weight if told by your doctor.  Stop smoking or chewing tobacco. Do not use gum or patches that help you quit without your doctor's approval.  Schedule and go to doctor visits as told.  Nonpregnant women should have no more than 1 drink a day. Men should have no more than 2 drinks a day. Talk to your doctor about drinking alcohol.  Stop illegal drug use.  Stay current  with shots (immunizations).  Manage your health conditions as told by your doctor.  Learn to manage your stress.  Rest when you are tired.  If it is really hot outside: ? Avoid intense activities. ? Use air conditioning or fans, or get in a cooler place. ? Avoid caffeine and alcohol. ? Wear loose-fitting, lightweight, and light-colored clothing.  If it is really cold outside: ? Avoid intense activities. ? Layer your clothing. ? Wear mittens or gloves, a hat, and a scarf when going outside. ? Avoid alcohol.  Learn about heart failure and get support as needed.  Get help to maintain or improve your quality of life and your ability to care for yourself as needed. Contact a doctor  if:  You gain weight quickly.  You are more short of breath than usual.  You cannot do your normal activities.  You tire easily.  You cough more than normal, especially with activity.  You have any or more puffiness (swelling) in areas such as your hands, feet, ankles, or belly (abdomen).  You cannot sleep because it is hard to breathe.  You feel like your heart is beating fast (palpitations).  You get dizzy or light-headed when you stand up. Get help right away if:  You have trouble breathing.  There is a change in mental status, such as becoming less alert or not being able to focus.  You have chest pain or discomfort.  You faint. This information is not intended to replace advice given to you by your health care provider. Make sure you discuss any questions you have with your health care provider. Document Released: 06/11/2008 Document Revised: 02/08/2016 Document Reviewed: 10/19/2012 Elsevier Interactive Patient Education  2017 [employer].

## 2017-12-03 NOTE — Telephone Encounter (Signed)
Advised patient's son, sending prescription for Pulmicort to [facility], requesting for [employer] consider filing under Medicare B, DME Nebulizer medicine. faxed order per [name]

## 2017-12-04 ENCOUNTER — Telehealth: Payer: Self-pay | Admitting: Physician Assistant

## 2017-12-04 ENCOUNTER — Encounter: Payer: Self-pay | Admitting: Physician Assistant

## 2017-12-04 LAB — BASIC METABOLIC PANEL WITH GFR
BUN/Creatinine Ratio: 20 (calc) (ref 6–22)
BUN: 21 mg/dL (ref 7–25)
CALCIUM: 9.4 mg/dL (ref 8.6–10.4)
CO2: 39 mmol/L — ABNORMAL HIGH (ref 20–32)
CREATININE: 1.04 mg/dL — AB (ref 0.60–0.93)
Chloride: 98 mmol/L (ref 98–110)
GFR, Est African American: 63 mL/min/{1.73_m2} (ref >=60)
GFR, Est Non African American: 54 mL/min/{1.73_m2} — ABNORMAL LOW (ref >=60)
GLUCOSE: 133 mg/dL — AB (ref 65–99)
Potassium: 3.6 mmol/L (ref 3.5–5.3)
Sodium: 144 mmol/L (ref 135–146)

## 2017-12-04 LAB — MAGNESIUM: Magnesium: 1.6 mg/dL (ref 1.5–2.5)

## 2017-12-04 NOTE — Telephone Encounter (Signed)
Faxed order from [name] to [place] for DME [facility] Bed. Advised patient's son,  DME order for semi electric bed, 1/2 rail, and gel overlay  was faxed to [place].

## 2017-12-05 ENCOUNTER — Other Ambulatory Visit: Payer: Self-pay | Admitting: Physician Assistant

## 2017-12-05 ENCOUNTER — Telehealth: Payer: Self-pay | Admitting: Physician Assistant

## 2017-12-05 DIAGNOSIS — R29898 Other symptoms and signs involving the musculoskeletal system: Secondary | ICD-10-CM | POA: Diagnosis not present

## 2017-12-05 DIAGNOSIS — J449 Chronic obstructive pulmonary disease, unspecified: Secondary | ICD-10-CM | POA: Diagnosis not present

## 2017-12-05 DIAGNOSIS — M5440 Lumbago with sciatica, unspecified side: Secondary | ICD-10-CM | POA: Diagnosis not present

## 2017-12-05 MED ORDER — METOLAZONE 5 MG PO TABS: ORAL_TABLET | ORAL | 0 refills | Status: DC

## 2017-12-05 NOTE — Telephone Encounter (Signed)
Patient was suppose to call to day with her weight. Son, Roselyn Reef, states that it is unchanged at 264.6 while on the lasix 160mg  a day and water restriction. Discussed switching patient to toresmide but Dr. Melford Aase suggest adding on short dose of zaroxyln 5mg . Talked with Roselyn Reef, will start tomorrow and call Monday. ER precautions dicussed.

## 2017-12-05 NOTE — Telephone Encounter (Signed)
NOT TAKING PULMICORT. TOO EXPENSIVE. TRYING APRI, FILE UNDER MCR B AS DME MEDICATION/NEBULIZER

## 2017-12-08 ENCOUNTER — Encounter: Payer: Self-pay | Admitting: Physician Assistant

## 2017-12-09 DIAGNOSIS — J9610 Chronic respiratory failure, unspecified whether with hypoxia or hypercapnia: Secondary | ICD-10-CM | POA: Diagnosis not present

## 2017-12-09 DIAGNOSIS — J449 Chronic obstructive pulmonary disease, unspecified: Secondary | ICD-10-CM | POA: Diagnosis not present

## 2017-12-10 NOTE — Progress Notes (Signed)
Assessment and Plan:   COPD oxygen dependent - will try to get pulmicort nebulizer - duoneb DAILY BID AT LEAST, more if short of breath - keep O2 at 90, if less than 88 on 8 L need to go to ER -cont O2  -cont inhalers - needs to follow up with pulmonary- has OV  Acute on chronic diastolic CHF (congestive heart failure) (HCC) Continue lasix to 160mg - added on zaroxyln- weight down significantly No muscle cramps and breathing is better Pending labs will adjust zaroxyln.  Follow up cardiology Decrease water Decrease salt/sugar Elevate legs Follow up 1 week STRICT ER PRECAUTIONS -     BASIC METABOLIC PANEL WITH GFR and MAG  Pulmonary hypertension (HCC) Stressed importance of using bipap Recheck CO2  Morbid Obesity with co morbidities - long discussion about weight loss, diet, and exercise  Arthritis Will refer to homes health, difficult for patient to leave the house Doing better with hospital bed Has follow up with ortho  Continue diet and meds as discussed. Further disposition pending results of labs. Discussed med's effects and SE's.  OVER 60 mins was spent with patient and son, very complicated patient with high level of medical decision making and counseling.  Future Appointments  Date Time Provider Cassandra  12/16/2017  3:00 PM Unk Pinto, MD [facility] None  12/22/2017  2:45 PM Marlou Sa Tonna Corner, MD [facility] None  01/01/2018  2:30 PM Barrett, Evelene Croon, PA-C [facility]  01/07/2018  2:30 PM Chesley Mires, MD [facility] None   HPI 72 y.o. female  presents for 1 week follow up for COPD, heart failure, patient is being followed very closely for fluid overload and hospital admission risk with multiple comorbid conditions.   Patient has severe end stage COPD with CO2 retention and diastolic heart failure.  Patient was seen last OV with elevated BNP, increased weight and increased demand for O2 from 5 to 8 L. Her lasix was increased last visit to 160mg   daily with strict ER precautions and zaroxyln 5mg  was added on daily for 3-4 days, she has lost to 251 today in the office she is 244 and her zaroxyln was cut back to 1/2 pill 2 days a week. She is on 10 L right now, 02 is 81 %. Her 02 at home has been in 90's. Her legs are still swollen but she does not have as much pain in her legs, she is doing better, no muscle aches.  Wt Readings from Last 3 Encounters:  12/11/17 242 lb 3.2 oz (109.9 kg)  12/03/17 264 lb (119.7 kg)  11/27/17 268 lb (121.6 kg)     She does not have budesonide, just albuterol/ipatropium.    She has pain bilateral legs, shoulders and hips. She states it is hard to get up and down, hard to walk, hard to get out of bed. She states that she feels weak and painful.  She has a hospital bed since last visit which she states is helping and was referred to ortho and she has April 8th appointment.   Lab Results  Component Value Date   CREATININE 1.04 (H) 12/03/2017   BUN 21 12/03/2017   NA 144 12/03/2017   K 3.6 12/03/2017   CL 98 12/03/2017   CO2 39 (H) 12/03/2017     Lab Results  Component Value Date   GFRNONAA 54 (L) 12/03/2017   Lab Results  Component Value Date   HGBA1C 6.2 (H) 11/10/2017     Her blood pressure has been  controlled at home, today their BP is BP: 118/66.She does not workout. She denies chest pain, dizziness.  She has Afib is on NOT on coumadin due to recent GI bleeds.   BMI is Body mass index is 44.3 kg/m., she is working on diet and exercise. Wt Readings from Last 3 Encounters:  12/11/17 242 lb 3.2 oz (109.9 kg)  12/03/17 264 lb (119.7 kg)  11/27/17 268 lb (121.6 kg)    Current Medications:  Current Outpatient Medications on File Prior to Visit  Medication Sig  . Acetaminophen (TYLENOL 8 HOUR ARTHRITIS PAIN PO) Take 1 tablet by mouth. PRN  . acetaminophen-codeine (TYLENOL #3) 300-30 MG tablet Take 1 tablet by mouth every 8 (eight) hours as needed for moderate pain or severe pain.  Marland Kitchen  acetaZOLAMIDE (DIAMOX) 250 MG tablet TAKE 1 TABLET (250 MG TOTAL) BY MOUTH 3 (THREE) TIMES DAILY.  Marland Kitchen albuterol (PROVENTIL HFA;VENTOLIN HFA) 108 (90 Base) MCG/ACT inhaler Inhale 1-2 puffs into the lungs every 6 (six) hours as needed for wheezing or shortness of breath.  Marland Kitchen albuterol (PROVENTIL) (2.5 MG/3ML) 0.083% nebulizer solution Take 3 mLs (2.5 mg total) by nebulization every 6 (six) hours as needed for wheezing or shortness of breath.  Marland Kitchen aspirin 81 MG tablet Take 81 mg by mouth daily.  Marland Kitchen atenolol (TENORMIN) 50 MG tablet Take 1 tablet (50 mg total) by mouth daily.  . budesonide (PULMICORT) 0.5 MG/2ML nebulizer solution Take 2 mLs (0.5 mg total) by nebulization 2 (two) times daily.  . Cholecalciferol (VITAMIN D PO) Take 2,000 Units by mouth daily.   . diazepam (VALIUM) 5 MG tablet Take one tablet up to 2 x a daily for anxiety AS NEEDED  . diltiazem (CARDIZEM CD) 300 MG 24 hr capsule TAKE 1 CAPSULE (300 MG TOTAL) BY MOUTH DAILY.  . DULoxetine (CYMBALTA) 60 MG capsule TAKE 1 CAPSULE BY MOUTH DAILY  . ferrous sulfate 325 (65 FE) MG tablet Take 325 mg by mouth 2 (two) times daily with a meal.  . fluticasone (FLONASE) 50 MCG/ACT nasal spray Place 2 sprays into both nostrils daily. (Patient taking differently: Place 2 sprays into both nostrils daily as needed for allergies. )  . furosemide (LASIX) 80 MG tablet Take 1 tablet (80 mg total) by mouth daily.  Marland Kitchen guaiFENesin (MUCINEX) 600 MG 12 hr tablet Take 600 mg by mouth 2 (two) times daily.   Marland Kitchen ipratropium (ATROVENT) 0.02 % nebulizer solution Take 2.5 mLs (0.5 mg total) by nebulization every 6 (six) hours.  Marland Kitchen levothyroxine (SYNTHROID, LEVOTHROID) 175 MCG tablet TAKE 1 TABLET (175 MCG TOTAL) BY MOUTH DAILY BEFORE BREAKFAST.  Marland Kitchen loratadine-pseudoephedrine (CLARITIN-D 24-HOUR) 10-240 MG per 24 hr tablet Take 1 tablet by mouth as needed for allergies.   . Magnesium Chloride (MAGNESIUM DR PO) Take 1 capsule by mouth daily.   . metFORMIN (GLUCOPHAGE-XR) 500 MG  24 hr tablet TAKE 1 TABLET (500 MG TOTAL) BY MOUTH 4 (FOUR) TIMES DAILY - AFTER MEALS AND AT BEDTIME.  . metolazone (ZAROXOLYN) 5 MG tablet Take 1 pill 1 hour before the lasix every day, call the office monday  . OXYGEN-HELIUM IN Inhale 5-6 L into the lungs continuous.   . pantoprazole (PROTONIX) 40 MG tablet Take 1 tablet (40 mg total) by mouth daily at 6 (six) AM.  . polyethylene glycol (MIRALAX / GLYCOLAX) packet Take 17 g by mouth 2 (two) times daily.  . pravastatin (PRAVACHOL) 40 MG tablet TAKE 1 TABLET BY MOUTH EVERY DAY   No current facility-administered medications  on file prior to visit.    Medical History:  Past Medical History:  Diagnosis Date  . Arthritis   . CHF (congestive heart failure) ([place])   . COPD (chronic obstructive pulmonary disease) ([place])   . Depression   . Diabetes mellitus type 2 in obese ([place])   . GERD (gastroesophageal reflux disease)   . Hyperlipidemia   . Hypertension   . Morbid obesity ([place])   . OAB (overactive bladder)   . PSVT (paroxysmal supraventricular tachycardia) ([place])   . Thyroid disease   . Vitamin D deficiency    Allergies:  Allergies  Allergen Reactions  . Inderal [Propranolol] Other (See Comments)    Hair loss  . Ace Inhibitors Cough     Review of Systems:  Review of Systems  Constitutional: Positive for malaise/fatigue. Negative for chills and fever.  HENT: Negative for congestion, ear pain and sore throat.   Eyes: Negative.   Respiratory: Positive for shortness of breath. Negative for cough and wheezing.   Cardiovascular: Positive for leg swelling. Negative for chest pain and palpitations.  Gastrointestinal: Negative for abdominal pain, blood in stool, constipation, diarrhea, heartburn and melena.  Genitourinary: Negative.   Skin: Negative.   Neurological: Positive for weakness. Negative for dizziness, sensory change, loss of consciousness and headaches.  Psychiatric/Behavioral: Negative for depression, hallucinations, memory  loss, substance abuse and suicidal ideas. The patient is nervous/anxious. The patient does not have insomnia.     Family history- Review and unchanged  Social history- Review and unchanged  Physical Exam: BP 118/66   Pulse (!) 111   Temp 97.7 F (36.5 C)   Ht 5\' 2"  (1.575 m)   Wt 242 lb 3.2 oz (109.9 kg)   SpO2 (!) 75%   BMI 44.30 kg/m  Wt Readings from Last 3 Encounters:  12/11/17 242 lb 3.2 oz (109.9 kg)  12/03/17 264 lb (119.7 kg)  11/27/17 268 lb (121.6 kg)   General appearance: alert, no distress, WD/WN,  female HEENT: normocephalic, sclerae anicteric, TMs pearly, nares patent, no discharge or erythema, pharynx normal Oral cavity: MMM, no lesions Neck: supple, no lymphadenopathy, no thyromegaly, no masses Heart: Irreg irreg, normal S1, S2, systolic murmur Lungs: decreased breath sounds, no wheezing no rhonchi, no rales, 8 L O2  Abdomen: +bs, soft, non tender, non distended, no masses, no hepatomegaly, no splenomegaly Musculoskeletal: nontender, no swelling, no obvious deformity Extremities: 3-4+pitting  EDEMA, + dependent rubor, bilateral legs with erythema, no warmth, no weeping.  Pulses: 2+ symmetric, upper and lower extremities, normal cap refill Neurological: alert, oriented x 3, CN2-12 intact, strength normal upper extremities and lower extremities,  DTRs 2+ throughout, no cerebellar signs, patient in wheelchair due to SOB Psychiatric: normal affect, behavior normal   [name], PA-C 4:19 PM [facility]

## 2017-12-11 ENCOUNTER — Encounter: Payer: Self-pay | Admitting: Physician Assistant

## 2017-12-11 ENCOUNTER — Ambulatory Visit (INDEPENDENT_AMBULATORY_CARE_PROVIDER_SITE_OTHER): Payer: PPO | Admitting: Physician Assistant

## 2017-12-11 ENCOUNTER — Telehealth: Payer: Self-pay | Admitting: Physician Assistant

## 2017-12-11 VITALS — BP 118/66 | HR 111 | Temp 97.7°F | Ht 62.0 in | Wt 244.0 lb

## 2017-12-11 DIAGNOSIS — I272 Pulmonary hypertension, unspecified: Secondary | ICD-10-CM

## 2017-12-11 DIAGNOSIS — I10 Essential (primary) hypertension: Secondary | ICD-10-CM | POA: Diagnosis not present

## 2017-12-11 DIAGNOSIS — I4892 Unspecified atrial flutter: Secondary | ICD-10-CM

## 2017-12-11 DIAGNOSIS — J449 Chronic obstructive pulmonary disease, unspecified: Secondary | ICD-10-CM | POA: Diagnosis not present

## 2017-12-11 DIAGNOSIS — I471 Supraventricular tachycardia: Secondary | ICD-10-CM | POA: Diagnosis not present

## 2017-12-11 DIAGNOSIS — N183 Chronic kidney disease, stage 3 unspecified: Secondary | ICD-10-CM

## 2017-12-11 DIAGNOSIS — I700 Atherosclerosis of aorta: Secondary | ICD-10-CM

## 2017-12-11 DIAGNOSIS — I5032 Chronic diastolic (congestive) heart failure: Secondary | ICD-10-CM

## 2017-12-11 DIAGNOSIS — I420 Dilated cardiomyopathy: Secondary | ICD-10-CM | POA: Diagnosis not present

## 2017-12-11 DIAGNOSIS — G4733 Obstructive sleep apnea (adult) (pediatric): Secondary | ICD-10-CM | POA: Diagnosis not present

## 2017-12-11 NOTE — Telephone Encounter (Signed)
Left voicemail for [name] at [place], what is status of Pulmicort rx,  to be filed on Medicare B?

## 2017-12-11 NOTE — Patient Instructions (Signed)
Depending on your kidney function we will adjust the metolazone today.   Do the following things EVERYDAY: 1) Weigh yourself in the morning before breakfast or at the same time every day. Write it down and keep it in a log. 2) Take your medicines as prescribed 3) Eat low salt foods-Limit salt (sodium) to 2000 mg per day. Best thing to do is avoid processed foods.   4) Stay as active as you can everyday 5) Limit all fluids for the day to less than 1.5 liters  Call your doctor if:  Anytime you have any of the following symptoms:  1) 2 pound weight gain in 24 hours or 5 pounds in 1 week  2) shortness of breath, with or without a dry hacking cough  3) swelling in the hands, LEGs, feet or stomach  4) if you have to sleep on extra pillows at night in order to breathe. 5) after laying down at night for 20-30 mins, you wake up short of breath.   These can all be signs of fluid overload.    Heart Failure Heart failure means your heart has trouble pumping blood. This makes it hard for your body to work well. Heart failure is usually a long-term (chronic) condition. You must take good care of yourself and follow your doctor's treatment plan. Follow these instructions at home:  Take your heart medicine as told by your doctor. ? Do not stop taking medicine unless your doctor tells you to. ? Do not skip any dose of medicine. ? Refill your medicines before they run out. ? Take other medicines only as told by your doctor or pharmacist.  Stay active if told by your doctor. The elderly and people with severe heart failure should talk with a doctor about physical activity.  Eat heart-healthy foods. Choose foods that are without trans fat and are low in saturated fat, cholesterol, and salt (sodium). This includes fresh or frozen fruits and vegetables, fish, lean meats, fat-free or low-fat dairy foods, whole grains, and high-fiber foods. Lentils and dried peas and beans (legumes) are also good  choices.  Limit salt if told by your doctor.  Cook in a healthy way. Roast, grill, broil, bake, poach, steam, or stir-fry foods.  Limit fluids as told by your doctor.  Weigh yourself every morning. Do this after you pee (urinate) and before you eat breakfast. Write down your weight to give to your doctor.  Take your blood pressure and write it down if your doctor tells you to.  Ask your doctor how to check your pulse. Check your pulse as told.  Lose weight if told by your doctor.  Stop smoking or chewing tobacco. Do not use gum or patches that help you quit without your doctor's approval.  Schedule and go to doctor visits as told.  Nonpregnant women should have no more than 1 drink a day. Men should have no more than 2 drinks a day. Talk to your doctor about drinking alcohol.  Stop illegal drug use.  Stay current with shots (immunizations).  Manage your health conditions as told by your doctor.  Learn to manage your stress.  Rest when you are tired.  If it is really hot outside: ? Avoid intense activities. ? Use air conditioning or fans, or get in a cooler place. ? Avoid caffeine and alcohol. ? Wear loose-fitting, lightweight, and light-colored clothing.  If it is really cold outside: ? Avoid intense activities. ? Layer your clothing. ? Wear mittens or gloves, a  hat, and a scarf when going outside. ? Avoid alcohol.  Learn about heart failure and get support as needed.  Get help to maintain or improve your quality of life and your ability to care for yourself as needed. Contact a doctor if:  You gain weight quickly.  You are more short of breath than usual.  You cannot do your normal activities.  You tire easily.  You cough more than normal, especially with activity.  You have any or more puffiness (swelling) in areas such as your hands, feet, ankles, or belly (abdomen).  You cannot sleep because it is hard to breathe.  You feel like your heart is beating  fast (palpitations).  You get dizzy or light-headed when you stand up. Get help right away if:  You have trouble breathing.  There is a change in mental status, such as becoming less alert or not being able to focus.  You have chest pain or discomfort.  You faint. This information is not intended to replace advice given to you by your health care provider. Make sure you discuss any questions you have with your health care provider. Document Released: 06/11/2008 Document Revised: 02/08/2016 Document Reviewed: 10/19/2012 Elsevier Interactive Patient Education  2017 [employer].

## 2017-12-12 ENCOUNTER — Telehealth: Payer: Self-pay | Admitting: Physician Assistant

## 2017-12-12 ENCOUNTER — Inpatient Hospital Stay (HOSPITAL_COMMUNITY)
Admission: EM | Admit: 2017-12-12 | Discharge: 2017-12-16 | DRG: 291 | Disposition: A | Discharge status: Home / Self Care | Payer: PPO | Attending: Internal Medicine | Admitting: Internal Medicine

## 2017-12-12 ENCOUNTER — Other Ambulatory Visit: Payer: Self-pay

## 2017-12-12 ENCOUNTER — Emergency Department (HOSPITAL_COMMUNITY): Payer: PPO

## 2017-12-12 ENCOUNTER — Encounter (HOSPITAL_COMMUNITY): Payer: Self-pay

## 2017-12-12 DIAGNOSIS — I420 Dilated cardiomyopathy: Secondary | ICD-10-CM | POA: Diagnosis not present

## 2017-12-12 DIAGNOSIS — G4733 Obstructive sleep apnea (adult) (pediatric): Secondary | ICD-10-CM | POA: Diagnosis not present

## 2017-12-12 DIAGNOSIS — Z6841 Body Mass Index (BMI) 40.0 and over, adult: Secondary | ICD-10-CM

## 2017-12-12 DIAGNOSIS — R0602 Shortness of breath: Secondary | ICD-10-CM | POA: Diagnosis not present

## 2017-12-12 DIAGNOSIS — N3281 Overactive bladder: Secondary | ICD-10-CM | POA: Diagnosis not present

## 2017-12-12 DIAGNOSIS — I5023 Acute on chronic systolic (congestive) heart failure: Secondary | ICD-10-CM

## 2017-12-12 DIAGNOSIS — Z9989 Dependence on other enabling machines and devices: Secondary | ICD-10-CM | POA: Diagnosis not present

## 2017-12-12 DIAGNOSIS — Z7951 Long term (current) use of inhaled steroids: Secondary | ICD-10-CM

## 2017-12-12 DIAGNOSIS — I110 Hypertensive heart disease with heart failure: Secondary | ICD-10-CM | POA: Diagnosis not present

## 2017-12-12 DIAGNOSIS — Z7982 Long term (current) use of aspirin: Secondary | ICD-10-CM

## 2017-12-12 DIAGNOSIS — K219 Gastro-esophageal reflux disease without esophagitis: Secondary | ICD-10-CM | POA: Diagnosis not present

## 2017-12-12 DIAGNOSIS — Z79890 Hormone replacement therapy (postmenopausal): Secondary | ICD-10-CM

## 2017-12-12 DIAGNOSIS — N179 Acute kidney failure, unspecified: Secondary | ICD-10-CM | POA: Diagnosis present

## 2017-12-12 DIAGNOSIS — R0603 Acute respiratory distress: Secondary | ICD-10-CM | POA: Diagnosis present

## 2017-12-12 DIAGNOSIS — I471 Supraventricular tachycardia: Secondary | ICD-10-CM | POA: Diagnosis not present

## 2017-12-12 DIAGNOSIS — Z86010 Personal history of colonic polyps: Secondary | ICD-10-CM

## 2017-12-12 DIAGNOSIS — F329 Major depressive disorder, single episode, unspecified: Secondary | ICD-10-CM | POA: Diagnosis present

## 2017-12-12 DIAGNOSIS — M1990 Unspecified osteoarthritis, unspecified site: Secondary | ICD-10-CM | POA: Diagnosis present

## 2017-12-12 DIAGNOSIS — Z87891 Personal history of nicotine dependence: Secondary | ICD-10-CM

## 2017-12-12 DIAGNOSIS — Z8249 Family history of ischemic heart disease and other diseases of the circulatory system: Secondary | ICD-10-CM

## 2017-12-12 DIAGNOSIS — F419 Anxiety disorder, unspecified: Secondary | ICD-10-CM | POA: Diagnosis present

## 2017-12-12 DIAGNOSIS — J9602 Acute respiratory failure with hypercapnia: Secondary | ICD-10-CM | POA: Diagnosis not present

## 2017-12-12 DIAGNOSIS — Z811 Family history of alcohol abuse and dependence: Secondary | ICD-10-CM

## 2017-12-12 DIAGNOSIS — I130 Hypertensive heart and chronic kidney disease with heart failure and stage 1 through stage 4 chronic kidney disease, or unspecified chronic kidney disease: Secondary | ICD-10-CM | POA: Diagnosis not present

## 2017-12-12 DIAGNOSIS — E039 Hypothyroidism, unspecified: Secondary | ICD-10-CM | POA: Diagnosis not present

## 2017-12-12 DIAGNOSIS — J9621 Acute and chronic respiratory failure with hypoxia: Secondary | ICD-10-CM | POA: Diagnosis not present

## 2017-12-12 DIAGNOSIS — E559 Vitamin D deficiency, unspecified: Secondary | ICD-10-CM | POA: Diagnosis present

## 2017-12-12 DIAGNOSIS — N183 Chronic kidney disease, stage 3 (moderate): Secondary | ICD-10-CM | POA: Diagnosis not present

## 2017-12-12 DIAGNOSIS — Z888 Allergy status to other drugs, medicaments and biological substances status: Secondary | ICD-10-CM

## 2017-12-12 DIAGNOSIS — E1122 Type 2 diabetes mellitus with diabetic chronic kidney disease: Secondary | ICD-10-CM | POA: Diagnosis present

## 2017-12-12 DIAGNOSIS — E785 Hyperlipidemia, unspecified: Secondary | ICD-10-CM | POA: Diagnosis present

## 2017-12-12 DIAGNOSIS — E876 Hypokalemia: Secondary | ICD-10-CM | POA: Diagnosis not present

## 2017-12-12 DIAGNOSIS — I5043 Acute on chronic combined systolic (congestive) and diastolic (congestive) heart failure: Secondary | ICD-10-CM | POA: Diagnosis present

## 2017-12-12 DIAGNOSIS — Z9981 Dependence on supplemental oxygen: Secondary | ICD-10-CM

## 2017-12-12 DIAGNOSIS — J9622 Acute and chronic respiratory failure with hypercapnia: Secondary | ICD-10-CM | POA: Diagnosis present

## 2017-12-12 DIAGNOSIS — J449 Chronic obstructive pulmonary disease, unspecified: Secondary | ICD-10-CM | POA: Diagnosis not present

## 2017-12-12 DIAGNOSIS — J9690 Respiratory failure, unspecified, unspecified whether with hypoxia or hypercapnia: Secondary | ICD-10-CM | POA: Diagnosis not present

## 2017-12-12 DIAGNOSIS — I4892 Unspecified atrial flutter: Secondary | ICD-10-CM | POA: Diagnosis not present

## 2017-12-12 DIAGNOSIS — J9612 Chronic respiratory failure with hypercapnia: Secondary | ICD-10-CM | POA: Diagnosis not present

## 2017-12-12 DIAGNOSIS — I272 Pulmonary hypertension, unspecified: Secondary | ICD-10-CM | POA: Diagnosis not present

## 2017-12-12 DIAGNOSIS — Z7984 Long term (current) use of oral hypoglycemic drugs: Secondary | ICD-10-CM

## 2017-12-12 DIAGNOSIS — E6609 Other obesity due to excess calories: Secondary | ICD-10-CM | POA: Diagnosis not present

## 2017-12-12 DIAGNOSIS — J9601 Acute respiratory failure with hypoxia: Secondary | ICD-10-CM | POA: Diagnosis not present

## 2017-12-12 DIAGNOSIS — J9600 Acute respiratory failure, unspecified whether with hypoxia or hypercapnia: Secondary | ICD-10-CM | POA: Diagnosis not present

## 2017-12-12 DIAGNOSIS — L8990 Pressure ulcer of unspecified site, unspecified stage: Secondary | ICD-10-CM | POA: Diagnosis present

## 2017-12-12 LAB — CBC WITH DIFFERENTIAL/PLATELET
BASOS ABS: 20 {cells}/uL (ref 0–200)
BASOS PCT: 0 %
Basophils Absolute: 0 10*3/uL (ref 0.0–0.1)
Basophils Relative: 0.3 %
EOS ABS: 0.1 10*3/uL (ref 0.0–0.7)
Eosinophils Absolute: 40 cells/uL (ref 15–500)
Eosinophils Relative: 0.6 %
Eosinophils Relative: 1 %
HCT: 40.9 % (ref 36.0–46.0)
HEMATOCRIT: 41.1 % (ref 35.0–45.0)
Hemoglobin: 12 g/dL (ref 11.7–15.5)
Hemoglobin: 11.5 g/dL — ABNORMAL LOW (ref 12.0–15.0)
LYMPHS ABS: 657 {cells}/uL — AB (ref 850–3900)
Lymphocytes Relative: 15 %
Lymphs Abs: 0.9 10*3/uL (ref 0.7–4.0)
MCH: 23.5 pg — ABNORMAL LOW (ref 27.0–33.0)
MCH: 24 pg — ABNORMAL LOW (ref 26.0–34.0)
MCHC: 29.2 g/dL — AB (ref 32.0–36.0)
MCHC: 28.1 g/dL — AB (ref 30.0–36.0)
MCV: 80.4 fL (ref 80.0–100.0)
MCV: 85.2 fL (ref 78.0–100.0)
MONO ABS: 0.4 10*3/uL (ref 0.1–1.0)
MONOS PCT: 7 %
MPV: 9.9 fL (ref 7.5–12.5)
Monocytes Relative: 9.8 %
NEUTROS PCT: 79.5 %
NEUTROS PCT: 77 %
Neutro Abs: 5327 cells/uL (ref 1500–7800)
Neutro Abs: 4.9 10*3/uL (ref 1.7–7.7)
PLATELETS: 284 10*3/uL (ref 140–400)
Platelets: 256 10*3/uL (ref 150–400)
RBC: 5.11 10*6/uL — ABNORMAL HIGH (ref 3.80–5.10)
RBC: 4.8 MIL/uL (ref 3.87–5.11)
RDW: 15.9 % — ABNORMAL HIGH (ref 11.0–15.0)
RDW: 18.6 % — AB (ref 11.5–15.5)
TOTAL LYMPHOCYTE: 9.8 %
WBC: 6.7 10*3/uL (ref 3.8–10.8)
WBC: 6.3 10*3/uL (ref 4.0–10.5)
WBCMIX: 657 {cells}/uL (ref 200–950)

## 2017-12-12 LAB — CBC
HEMATOCRIT: 41.2 % (ref 36.0–46.0)
Hemoglobin: 11.2 g/dL — ABNORMAL LOW (ref 12.0–15.0)
MCH: 23.2 pg — AB (ref 26.0–34.0)
MCHC: 27.2 g/dL — AB (ref 30.0–36.0)
MCV: 85.3 fL (ref 78.0–100.0)
Platelets: 277 10*3/uL (ref 150–400)
RBC: 4.83 MIL/uL (ref 3.87–5.11)
RDW: 18.8 % — AB (ref 11.5–15.5)
WBC: 5.7 10*3/uL (ref 4.0–10.5)

## 2017-12-12 LAB — BASIC METABOLIC PANEL WITH GFR
BUN / CREAT RATIO: 24 (calc) — AB (ref 6–22)
BUN: 33 mg/dL — ABNORMAL HIGH (ref 7–25)
CHLORIDE: 86 mmol/L — AB (ref 98–110)
CO2: >45 mmol/L — ABNORMAL HIGH (ref 20–32)
CREATININE: 1.36 mg/dL — AB (ref 0.60–0.93)
Calcium: 9.9 mg/dL (ref 8.6–10.4)
GFR, EST AFRICAN AMERICAN: 45 mL/min/{1.73_m2} — AB (ref >=60)
GFR, Est Non African American: 39 mL/min/{1.73_m2} — ABNORMAL LOW (ref >=60)
GLUCOSE: 127 mg/dL — AB (ref 65–99)
Potassium: 3.4 mmol/L — ABNORMAL LOW (ref 3.5–5.3)
SODIUM: 142 mmol/L (ref 135–146)

## 2017-12-12 LAB — CREATININE, SERUM
CREATININE: 1.39 mg/dL — AB (ref 0.44–1.00)
GFR calc Af Amer: 43 mL/min — ABNORMAL LOW (ref >60)
GFR, EST NON AFRICAN AMERICAN: 37 mL/min — AB (ref >60)

## 2017-12-12 LAB — I-STAT ARTERIAL BLOOD GAS, ED
ACID-BASE EXCESS: 21 mmol/L — AB (ref 0.0–2.0)
BICARBONATE: 49.1 mmol/L — AB (ref 20.0–28.0)
O2 SAT: 86 %
TCO2: >50 mmol/L — ABNORMAL HIGH (ref 22–32)
pCO2 arterial: 68.6 mmHg (ref 32.0–48.0)
pH, Arterial: 7.463 — ABNORMAL HIGH (ref 7.350–7.450)
pO2, Arterial: 51 mmHg — ABNORMAL LOW (ref 83.0–108.0)

## 2017-12-12 LAB — MAGNESIUM: Magnesium: 1.7 mg/dL (ref 1.5–2.5)

## 2017-12-12 LAB — I-STAT TROPONIN, ED: TROPONIN I, POC: 0.04 ng/mL (ref 0.00–0.08)

## 2017-12-12 LAB — BASIC METABOLIC PANEL
Anion gap: 17 — ABNORMAL HIGH (ref 5–15)
BUN: 33 mg/dL — ABNORMAL HIGH (ref 6–20)
CALCIUM: 9.4 mg/dL (ref 8.9–10.3)
CO2: 41 mmol/L — AB (ref 22–32)
CREATININE: 1.4 mg/dL — AB (ref 0.44–1.00)
Chloride: 82 mmol/L — ABNORMAL LOW (ref 101–111)
GFR calc non Af Amer: 37 mL/min — ABNORMAL LOW (ref >60)
GFR, EST AFRICAN AMERICAN: 43 mL/min — AB (ref >60)
GLUCOSE: 110 mg/dL — AB (ref 65–99)
Potassium: 2.9 mmol/L — ABNORMAL LOW (ref 3.5–5.1)
Sodium: 140 mmol/L (ref 135–145)

## 2017-12-12 LAB — BRAIN NATRIURETIC PEPTIDE: B Natriuretic Peptide: 978.6 pg/mL — ABNORMAL HIGH (ref 0.0–100.0)

## 2017-12-12 LAB — GLUCOSE, CAPILLARY: Glucose-Capillary: 115 mg/dL — ABNORMAL HIGH (ref 65–99)

## 2017-12-12 MED ORDER — IPRATROPIUM-ALBUTEROL 0.5-2.5 (3) MG/3ML IN SOLN: 3.0000 mL | Freq: Four times a day (QID) | RESPIRATORY_TRACT | Status: DC

## 2017-12-12 MED ORDER — ALBUTEROL SULFATE (2.5 MG/3ML) 0.083% IN NEBU
5.0000 mg | INHALATION_SOLUTION | Freq: Once | RESPIRATORY_TRACT | Status: AC
  Administered 2017-12-12: 5 mg via RESPIRATORY_TRACT
  Filled 2017-12-12: qty 6

## 2017-12-12 MED ORDER — POTASSIUM CHLORIDE 10 MEQ/100ML IV SOLN
10.0000 meq | Freq: Once | INTRAVENOUS | Status: AC
  Administered 2017-12-12: 10 meq via INTRAVENOUS
  Filled 2017-12-12: qty 100

## 2017-12-12 MED ORDER — METHYLPREDNISOLONE SODIUM SUCC 125 MG IJ SOLR: 60.0000 mg | Freq: Four times a day (QID) | INTRAMUSCULAR | Status: DC

## 2017-12-12 MED ORDER — BUDESONIDE 0.5 MG/2ML IN SUSP: 0.5000 mg | Freq: Two times a day (BID) | RESPIRATORY_TRACT | Status: DC

## 2017-12-12 MED ORDER — ALBUTEROL SULFATE (2.5 MG/3ML) 0.083% IN NEBU: 2.5000 mg | INHALATION_SOLUTION | RESPIRATORY_TRACT | Status: DC | PRN

## 2017-12-12 MED ORDER — LEVOTHYROXINE SODIUM 75 MCG PO TABS
175.0000 ug | ORAL_TABLET | Freq: Every day | ORAL | Status: DC
  Administered 2017-12-13 – 2017-12-16 (×4): 175 ug via ORAL
  Filled 2017-12-12 (×4): qty 1

## 2017-12-12 MED ORDER — INSULIN ASPART 100 UNIT/ML ~~LOC~~ SOLN
0.0000 [IU] | SUBCUTANEOUS | Status: DC
  Administered 2017-12-13: 7 [IU] via SUBCUTANEOUS
  Administered 2017-12-13: 3 [IU] via SUBCUTANEOUS
  Administered 2017-12-13: 7 [IU] via SUBCUTANEOUS
  Administered 2017-12-14 – 2017-12-16 (×9): 3 [IU] via SUBCUTANEOUS

## 2017-12-12 MED ORDER — ATENOLOL 25 MG PO TABS
50.0000 mg | ORAL_TABLET | Freq: Every day | ORAL | Status: DC
  Administered 2017-12-12 – 2017-12-16 (×5): 50 mg via ORAL
  Filled 2017-12-12 (×5): qty 2

## 2017-12-12 MED ORDER — DULOXETINE HCL 60 MG PO CPEP
60.0000 mg | ORAL_CAPSULE | Freq: Every day | ORAL | Status: DC
  Administered 2017-12-12 – 2017-12-16 (×5): 60 mg via ORAL
  Filled 2017-12-12 (×5): qty 1

## 2017-12-12 MED ORDER — METHYLPREDNISOLONE SODIUM SUCC 40 MG IJ SOLR
40.0000 mg | Freq: Every day | INTRAMUSCULAR | Status: DC
  Administered 2017-12-13: 40 mg via INTRAVENOUS
  Filled 2017-12-12: qty 1

## 2017-12-12 MED ORDER — SODIUM CHLORIDE 0.9 % IV SOLN: 2.0000 g | INTRAVENOUS | Status: DC

## 2017-12-12 MED ORDER — GUAIFENESIN ER 600 MG PO TB12
1200.0000 mg | ORAL_TABLET | Freq: Two times a day (BID) | ORAL | Status: DC
  Administered 2017-12-12 – 2017-12-16 (×8): 1200 mg via ORAL
  Filled 2017-12-12 (×9): qty 2

## 2017-12-12 MED ORDER — FUROSEMIDE 10 MG/ML IJ SOLN
100.0000 mg | Freq: Three times a day (TID) | INTRAVENOUS | Status: DC
  Administered 2017-12-13 (×2): 100 mg via INTRAVENOUS
  Filled 2017-12-12 (×3): qty 10

## 2017-12-12 MED ORDER — HEPARIN SODIUM (PORCINE) 5000 UNIT/ML IJ SOLN
5000.0000 [IU] | Freq: Three times a day (TID) | INTRAMUSCULAR | Status: DC
  Administered 2017-12-13 – 2017-12-16 (×9): 5000 [IU] via SUBCUTANEOUS
  Filled 2017-12-12 (×9): qty 1

## 2017-12-12 MED ORDER — ASPIRIN 81 MG PO CHEW
81.0000 mg | CHEWABLE_TABLET | Freq: Every day | ORAL | Status: DC
  Administered 2017-12-12 – 2017-12-16 (×5): 81 mg via ORAL
  Filled 2017-12-12 (×5): qty 1

## 2017-12-12 MED ORDER — PRAVASTATIN SODIUM 40 MG PO TABS
40.0000 mg | ORAL_TABLET | Freq: Every day | ORAL | Status: DC
  Administered 2017-12-12 – 2017-12-16 (×5): 40 mg via ORAL
  Filled 2017-12-12 (×5): qty 1

## 2017-12-12 MED ORDER — POTASSIUM CHLORIDE CRYS ER 20 MEQ PO TBCR
40.0000 meq | EXTENDED_RELEASE_TABLET | Freq: Once | ORAL | Status: AC
  Administered 2017-12-12: 40 meq via ORAL
  Filled 2017-12-12: qty 2

## 2017-12-12 MED ORDER — PANTOPRAZOLE SODIUM 40 MG PO TBEC
40.0000 mg | DELAYED_RELEASE_TABLET | Freq: Every day | ORAL | Status: DC
  Administered 2017-12-13 – 2017-12-14 (×2): 40 mg via ORAL
  Filled 2017-12-12 (×2): qty 1

## 2017-12-12 MED ORDER — IPRATROPIUM BROMIDE 0.02 % IN SOLN
0.5000 mg | Freq: Once | RESPIRATORY_TRACT | Status: AC
  Administered 2017-12-12: 0.5 mg via RESPIRATORY_TRACT
  Filled 2017-12-12: qty 2.5

## 2017-12-12 MED ORDER — IPRATROPIUM-ALBUTEROL 0.5-2.5 (3) MG/3ML IN SOLN
3.0000 mL | Freq: Three times a day (TID) | RESPIRATORY_TRACT | Status: DC
  Administered 2017-12-12 – 2017-12-16 (×11): 3 mL via RESPIRATORY_TRACT
  Filled 2017-12-12 (×11): qty 3

## 2017-12-12 MED ORDER — ACETAZOLAMIDE 250 MG PO TABS
250.0000 mg | ORAL_TABLET | Freq: Three times a day (TID) | ORAL | Status: DC
  Administered 2017-12-12 – 2017-12-13 (×4): 250 mg via ORAL
  Filled 2017-12-12 (×5): qty 1

## 2017-12-12 MED ORDER — FLUTICASONE PROPIONATE 50 MCG/ACT NA SUSP
2.0000 | Freq: Every day | NASAL | Status: DC | PRN
  Filled 2017-12-12: qty 16

## 2017-12-12 MED ORDER — METOLAZONE 5 MG PO TABS
5.0000 mg | ORAL_TABLET | Freq: Every day | ORAL | Status: DC
  Administered 2017-12-13: 5 mg via ORAL
  Filled 2017-12-12: qty 1

## 2017-12-12 MED ORDER — BUDESONIDE 0.5 MG/2ML IN SUSP
0.5000 mg | Freq: Two times a day (BID) | RESPIRATORY_TRACT | Status: DC
  Administered 2017-12-12 – 2017-12-16 (×8): 0.5 mg via RESPIRATORY_TRACT
  Filled 2017-12-12 (×8): qty 2

## 2017-12-12 NOTE — ED Notes (Signed)
Admitting at bedside 

## 2017-12-12 NOTE — H&P (Signed)
[facility] History and Physical  VEDA ARRELLANO XTG:[id number] DOB: August 19, 1946 DOA: 12/12/2017  Referring physician:  PCP: Unk Pinto, MD   Chief Complaint: "I was fine and they said I needed to come to the emergency room."  HPI: Jillian Wood is a 72 y.o. female with past medical history of heart failure, COPD, hypertension, super morbid obesity, chronic respiratory failure on 8 L oxygen.  Patient states that she has been in her normal state of health for several days.  No sick contacts.  No symptoms of illness.  Went to see her doctor and was told that she had low oxygen.  Was advised strongly to go to the emergency room as it was noted that her oxygen dropped more when she moved.  Patient states this is normal and that when she ambulates she needs to wear 10 L oxygen.  Patient came to the emergency room for evaluation.  ED course: Chest x-ray was clear.  Patient told EDP that she felt that she was holding more fluid in her body.  Denied any cough.  Troponin negative.  Patient given nebulizer treatment.  Placed on BiPAP as her O2 saturation was low.  ABG was done that showed some hypoxia and hypercarbia.  Hospitalist consulted for admission.   Review of Systems:  As per HPI otherwise 10 point review of systems negative.    Past Medical History:  Diagnosis Date  . Arthritis   . CHF (congestive heart failure) ([place])   . COPD (chronic obstructive pulmonary disease) ([place])   . Depression   . Diabetes mellitus type 2 in obese ([place])   . GERD (gastroesophageal reflux disease)   . Hyperlipidemia   . Hypertension   . Morbid obesity ([place])   . OAB (overactive bladder)   . PSVT (paroxysmal supraventricular tachycardia) ([place])   . Thyroid disease   . Vitamin D deficiency    Past Surgical History:  Procedure Laterality Date  . CARPAL TUNNEL RELEASE     L 1992 R 1984  . COLONOSCOPY N/A 08/19/2017   Procedure: COLONOSCOPY;  Surgeon: Milus Banister, MD;  Location: [facility]  ENDOSCOPY;  Service: Endoscopy;  Laterality: N/A;  . EYE SURGERY Bilateral    cataract  . NM MYOVIEW LTD  01/2011   Dobutamine Myoview: Negative perfusion scan for ischemia / infarct (poor image capture); Pt developed SVT with Dobutamine that reproduced her CP as it resolved with restoration of NSR.  Marland Kitchen PUBOVAGINAL SLING    . TEE WITHOUT CARDIOVERSION N/A 12/16/2016   Procedure: TRANSESOPHAGEAL ECHOCARDIOGRAM (TEE);  Surgeon: Sanda Klein, MD;  Location[facility] ENDOSCOPY;  Service: Cardiovascular;  Laterality: N/A;  . TONSILLECTOMY AND ADENOIDECTOMY    . TRANSTHORACIC ECHOCARDIOGRAM  01/2011   Hyperdynamic LV with EF 65-70%, Gr 1 DD, Mild Ao Stenosis - mean gradient ~19 mmHg   Social History:  reports that she quit smoking about 16 months ago. Her smoking use included cigarettes. She has a 22.00 pack-year smoking history. She has never used smokeless tobacco. She reports that she does not drink alcohol or use drugs.  Allergies  Allergen Reactions  . Inderal [Propranolol] Other (See Comments)    Hair loss  . Ace Inhibitors Cough    Family History  Problem Relation Age of Onset  . Alcohol abuse Father   . Heart disease Father      Prior to Admission medications   Medication Sig Start Date End Date Taking? Authorizing Provider  Acetaminophen (TYLENOL 8 HOUR ARTHRITIS PAIN PO) Take 1 tablet  by mouth. PRN   Yes [provider]  acetaminophen-codeine (TYLENOL #3) 300-30 MG tablet Take 1 tablet by mouth every 8 (eight) hours as needed for moderate pain or severe pain. 11/19/16  Yes [name], PA-C  acetaZOLAMIDE (DIAMOX) 250 MG tablet TAKE 1 TABLET (250 MG TOTAL) BY MOUTH 3 (THREE) TIMES DAILY. 03/23/17  Yes Unk Pinto, MD  albuterol (PROVENTIL HFA;VENTOLIN HFA) 108 (90 Base) MCG/ACT inhaler Inhale 1-2 puffs into the lungs every 6 (six) hours as needed for wheezing or shortness of breath. 05/30/17  Yes [name], PA-C  albuterol (PROVENTIL) (2.5 MG/3ML) 0.083% nebulizer  solution Take 3 mLs (2.5 mg total) by nebulization every 6 (six) hours as needed for wheezing or shortness of breath. 11/10/17  Yes [name], PA-C  aspirin 81 MG tablet Take 81 mg by mouth daily.   Yes [provider]  atenolol (TENORMIN) 50 MG tablet Take 1 tablet (50 mg total) by mouth daily. 10/15/17  Yes Unk Pinto, MD  budesonide (PULMICORT) 0.5 MG/2ML nebulizer solution Take 2 mLs (0.5 mg total) by nebulization 2 (two) times daily. 03/20/17  Yes [name], PA-C  Cholecalciferol (VITAMIN D PO) Take 2,000 Units by mouth daily.    Yes [provider]  diazepam (VALIUM) 5 MG tablet Take one tablet up to 2 x a daily for anxiety AS NEEDED 03/20/17  Yes [name], PA-C  diltiazem (CARDIZEM CD) 300 MG 24 hr capsule TAKE 1 CAPSULE (300 MG TOTAL) BY MOUTH DAILY. 06/21/17  Yes Unk Pinto, MD  DULoxetine (CYMBALTA) 60 MG capsule TAKE 1 CAPSULE BY MOUTH DAILY Patient taking differently: TAKE 60 mg  CAPSULE BY MOUTH DAILY 08/30/17  Yes Unk Pinto, MD  ferrous sulfate 325 (65 FE) MG tablet Take 325 mg by mouth 2 (two) times daily with a meal.   Yes [provider]  fluticasone (FLONASE) 50 MCG/ACT nasal spray Place 2 sprays into both nostrils daily. Patient taking differently: Place 2 sprays into both nostrils daily as needed for allergies.  10/29/16  Yes Forcucci, Courtney, PA-C  furosemide (LASIX) 80 MG tablet Take 1 tablet (80 mg total) by mouth daily. 08/12/17  Yes Unk Pinto, MD  guaiFENesin (MUCINEX) 600 MG 12 hr tablet Take 600 mg by mouth 2 (two) times daily.    Yes [provider]  ipratropium (ATROVENT) 0.02 % nebulizer solution Take 2.5 mLs (0.5 mg total) by nebulization every 6 (six) hours. 08/20/17  Yes Lavina Hamman, MD  levothyroxine (SYNTHROID, LEVOTHROID) 175 MCG tablet TAKE 1 TABLET (175 MCG TOTAL) BY MOUTH DAILY BEFORE BREAKFAST. 06/21/17  Yes Unk Pinto, MD  loratadine-pseudoephedrine (CLARITIN-D 24-HOUR) 10-240  MG per 24 hr tablet Take 1 tablet by mouth as needed for allergies.    Yes [provider]  Magnesium Chloride (MAGNESIUM DR PO) Take 1 capsule by mouth daily.    Yes [provider]  metFORMIN (GLUCOPHAGE-XR) 500 MG 24 hr tablet TAKE 1 TABLET (500 MG TOTAL) BY MOUTH 4 (FOUR) TIMES DAILY - AFTER MEALS AND AT BEDTIME. 04/29/17  Yes Unk Pinto, MD  metolazone (ZAROXOLYN) 5 MG tablet Take 1 pill 1 hour before the lasix every day, call the office monday Patient taking differently: Take 5 mg by mouth daily. Take 1 pill 1 hour before the lasix every day, call the office monday 12/05/17  Yes [name], PA-C  OXYGEN-HELIUM IN Inhale 8 L into the lungs continuous.    Yes [provider]  pantoprazole (PROTONIX) 40 MG tablet Take 1 tablet (  40 mg total) by mouth daily at 6 (six) AM. 10/13/17  Yes Unk Pinto, MD  polyethylene glycol [facility] / Floria Raveling) packet Take 17 g by mouth 2 (two) times daily. 08/20/17  Yes Lavina Hamman, MD  pravastatin (PRAVACHOL) 40 MG tablet TAKE 1 TABLET BY MOUTH EVERY DAY Patient taking differently: TAKE 40 mg TABLET BY MOUTH EVERY DAY 11/25/17  Yes Liane Comber, NP   Physical Exam: Vitals:   12/12/17 1330 12/12/17 1450 12/12/17 1547 12/12/17 1615  BP: 105/72 108/66 108/60 104/69  Pulse: 80 78 77 78  Resp: 17 18 16  (!) 25  Temp:      TempSrc:      SpO2: (!) 85% (!) 88% (!) 85% 92%  Weight:      Height:        Wt Readings from Last 3 Encounters:  12/12/17 110.7 kg (244 lb)  12/11/17 110.7 kg (244 lb)  12/03/17 119.7 kg (264 lb)    General:  Appears calm and comfortable; a&Ox3 Eyes:  PERRL, EOMI, normal lids, iris ENT:  grossly normal hearing, lips & tongue Neck:  no LAD, masses or thyromegaly Cardiovascular:  RRR, no m/r/g. No LE edema.  Respiratory:  CTA bilaterally, no w/r/r. Bipap. Tachypnea. Abdomen:  soft, ntnd Skin:  no rash or induration seen on limited exam Musculoskeletal:  grossly normal tone  BUE/BLE Psychiatric:  grossly normal mood and affect, speech fluent and appropriate Neurologic:  CN 2-12 grossly intact, moves all extremities in coordinated fashion.          Labs on Admission:  Basic Metabolic Panel: Recent Labs  Lab 12/11/17 1633 12/12/17 1407  NA 142 140  K 3.4* 2.9*  CL 86* 82*  CO2 >45* 41*  GLUCOSE 127* 110*  BUN 33* 33*  CREATININE 1.36* 1.40*  CALCIUM 9.9 9.4  MG 1.7  --    Liver Function Tests: No results for input(s): AST, ALT, ALKPHOS, BILITOT, PROT, ALBUMIN in the last 168 hours. No results for input(s): LIPASE, AMYLASE in the last 168 hours. No results for input(s): AMMONIA in the last 168 hours. CBC: Recent Labs  Lab 12/11/17 1633 12/12/17 1407  WBC 6.7 6.3  NEUTROABS 5,327 4.9  HGB 12.0 11.5*  HCT 41.1 40.9  MCV 80.4 85.2  PLT 284 256   Cardiac Enzymes: No results for input(s): CKTOTAL, CKMB, CKMBINDEX, TROPONINI in the last 168 hours.  BNP (last 3 results) Recent Labs    08/17/17 0257 11/10/17 1620 12/12/17 1407  BNP 610.4* 727* 978.6*    ProBNP (last 3 results) No results for input(s): PROBNP in the last 8760 hours.   Serum creatinine: 1.4 mg/dL (H) 12/12/17 1407 Estimated creatinine clearance: 43.2 mL/min (A)  CBG: No results for input(s): GLUCAP in the last 168 hours.  Radiological Exams on Admission: Dg Chest Port 1 View  Result Date: 12/12/2017 CLINICAL DATA:  Shortness of breath, fluid retention. History of CHF, COPD, diabetes, hypertension. Former smoker. EXAM: PORTABLE CHEST 1 VIEW COMPARISON:  Chest x-rays dated 08/15/2017, 11/11/2016 and 07/31/2016. FINDINGS: Stable cardiomegaly. Aortic atherosclerosis. Diffuse interstitial prominence is stable. Additional patchy opacities at the lung bases are not significantly changed, presumed chronic atelectasis and/or pleural thickening. No pleural effusion or pneumothorax seen. IMPRESSION: 1. Stable interstitial prominence, similar to multiple previous chest x-rays  suggesting chronic interstitial lung disease. No evidence of active CHF/pulmonary edema. 2. Stable bibasilar opacities, most likely chronic atelectasis and/or pleural thickening. No evidence of pneumonia or pleural effusion. 3. Stable cardiomegaly. 4. Aortic atherosclerosis. Electronically Signed  By: Franki Cabot M.D.   On: 12/12/2017 14:20    EKG: Independently reviewed. RBBB, LPFB. No stemi.  Assessment/Plan Active Problems:   Respiratory distress  Acute on Chronic Resp Failure with Hypoxia and Hypercarbia Will tx as combo of fluid overload and COPD AE Scheduled DuoNeb's When necessary albuterol Antibiotic-rocpephin Oxygen therapy Continuous pulse oximetry IV steroids Lasix 100mg  IV TID + 5mg  PO metolazone RT consult Pulm consult Mucinex BID 600-->1200mg  BID  Diabetes Sliding-scale insulin q4 Hold metformin  Hyperlipidemia Continue statin  High blood pressure Continue Tenormin  Anxiety Continue Valium, Cymbalta  Allergies Continue Flonase  Hypothyroidism Cont OP synthroid 175 mcg qd No signs of hyper or hypothyroidism   Code Status: FC  DVT Prophylaxis: heparin Family Communication: son Disposition Plan: Pending Improvement  Status: sdu inpt  Elwin Mocha, MD Family Medicine [facility] [email or web address] Password [email or web address]

## 2017-12-12 NOTE — Progress Notes (Signed)
Pt received in ED on home BIPAP ([employer]) pt found to have 10L HFNC under BIPAP mask with large air leak. [place] removed and  5L O2 bled through vent circuit, pt sats >88% pt appears stable in NARD. Will cont to monitor.

## 2017-12-12 NOTE — Telephone Encounter (Signed)
73 y.o. morbidly obese WF with severe end stage COPD O2 dependent on BiPAP a night, heart failure having failed out patient treatment.   Patient's O2 demand was increased to 8 L from 5L 3 weeks ago and she had an increase in her weight. Her lasix was increased from 80 mg to 160 mg a day, she continued to NOT have weight loss with SOB, edema. Zaroxolyn was added on, she had significant weight loss but continued to have SOB, O2 on 8-10 L at home, most recent labs show hypercapnic respiratory failure with hypokalemia and acute on chronic renal failure.   She was called this AM, spoke with son Audelia Acton, and advised him to take her to the ER for evaluation.

## 2017-12-12 NOTE — ED Notes (Signed)
Attempted report 

## 2017-12-12 NOTE — ED Provider Notes (Signed)
[facility] EMERGENCY DEPARTMENT Provider Note   CSN: [id number] Arrival date & time: 12/12/17  1229     History   Chief Complaint No chief complaint on file.   HPI Jillian Wood is a 72 y.o. female.  HPI   71 year old female with history of COPD currently on CPAP, CHF, hypertension, diabetes, OSA presents to the ED from home for evaluation of shortness of breath.  History obtained through son who is at bedside and through patient.  For the past 3-4 weeks patient has been gaining a lot of fluid in her body.  She follow-ups with her PCP every 2 weeks for close management of her condition.  During the last 6 follow-up visit recently, her labs were abnormal and patient was instructed to come to the ER for further evaluation of her condition.  Furthermore, her medication has been altered recently in regard to her fluid gain.  She is on increased dose of LasixAt 80 mg twice daily as well as  Metolazone 5 mg.  Patient is dependent on home oxygenation at 8-year since December.  She sleeps with a BiPAP as needed.  She denies any recent fever, chills, productive cough, or increased shortness of breath according to her.  She did acknowledge fluid gain.  She normally only able to ambulate approximately 5-10 feet before getting out of breath.  She denies any active chest pain.  Past Medical History:  Diagnosis Date  . Arthritis   . CHF (congestive heart failure) ([place])   . COPD (chronic obstructive pulmonary disease) ([place])   . Depression   . Diabetes mellitus type 2 in obese ([place])   . GERD (gastroesophageal reflux disease)   . Hyperlipidemia   . Hypertension   . Morbid obesity ([place])   . OAB (overactive bladder)   . PSVT (paroxysmal supraventricular tachycardia) ([place])   . Thyroid disease   . Vitamin D deficiency     Patient Active Problem List   Diagnosis Date Noted  . CKD (chronic kidney disease) stage 3, GFR 30-59 ml/min (HCC) 09/22/2017  . OSA and COPD overlap  syndrome ([place]) 08/14/2017  . DNR (do not resuscitate) 08/14/2017  . Abnormality of mitral valve annulus   . Aortic atherosclerosis ([place]) 12/09/2016  . CHF (congestive heart failure) ([place]) 11/11/2016  . Dilated cardiomyopathy ([place])   . Pulmonary hypertension ([place])   . Other abnormal glucose   . COPD (chronic obstructive pulmonary disease) ([place]) 07/31/2016  . Atrial flutter with rapid ventricular response ([place]) 07/31/2016  . GERD (gastroesophageal reflux disease) 07/31/2015  . Tobacco abuse 10/18/2014  . Paroxysmal SVT (supraventricular tachycardia) ([place]) 08/04/2014  . Hypothyroidism 02/22/2014  . Medication management 11/06/2013  . Essential hypertension   . Hyperlipidemia   . Morbid obesity ([place])   . Vitamin D deficiency   . Iron deficiency anemia 07/11/2010  . History of colonic polyps 07/11/2010    Past Surgical History:  Procedure Laterality Date  . CARPAL TUNNEL RELEASE     L 1992 R 1984  . COLONOSCOPY N/A 08/19/2017   Procedure: COLONOSCOPY;  Surgeon: Milus Banister, MD;  Location: [facility] ENDOSCOPY;  Service: Endoscopy;  Laterality: N/A;  . EYE SURGERY Bilateral    cataract  . NM MYOVIEW LTD  01/2011   Dobutamine Myoview: Negative perfusion scan for ischemia / infarct (poor image capture); Pt developed SVT with Dobutamine that reproduced her CP as it resolved with restoration of NSR.  Marland Kitchen PUBOVAGINAL SLING    . TEE WITHOUT CARDIOVERSION N/A 12/16/2016  Procedure: TRANSESOPHAGEAL ECHOCARDIOGRAM (TEE);  Surgeon: Sanda Klein, MD;  Location: [facility] ENDOSCOPY;  Service: Cardiovascular;  Laterality: N/A;  . TONSILLECTOMY AND ADENOIDECTOMY    . TRANSTHORACIC ECHOCARDIOGRAM  01/2011   Hyperdynamic LV with EF 65-70%, Gr 1 DD, Mild Ao Stenosis - mean gradient ~19 mmHg     OB History   None      Home Medications    Prior to Admission medications   Medication Sig Start Date End Date Taking? Authorizing Provider  Acetaminophen (TYLENOL 8 HOUR ARTHRITIS PAIN PO) Take 1 tablet by  mouth. PRN   Yes [provider]  acetaminophen-codeine (TYLENOL #3) 300-30 MG tablet Take 1 tablet by mouth every 8 (eight) hours as needed for moderate pain or severe pain. 11/19/16  Yes [name], PA-C  acetaZOLAMIDE (DIAMOX) 250 MG tablet TAKE 1 TABLET (250 MG TOTAL) BY MOUTH 3 (THREE) TIMES DAILY. 03/23/17  Yes Unk Pinto, MD  albuterol (PROVENTIL HFA;VENTOLIN HFA) 108 (90 Base) MCG/ACT inhaler Inhale 1-2 puffs into the lungs every 6 (six) hours as needed for wheezing or shortness of breath. 05/30/17  Yes [name], PA-C  albuterol (PROVENTIL) (2.5 MG/3ML) 0.083% nebulizer solution Take 3 mLs (2.5 mg total) by nebulization every 6 (six) hours as needed for wheezing or shortness of breath. 11/10/17  Yes [name], PA-C  aspirin 81 MG tablet Take 81 mg by mouth daily.   Yes [provider]  atenolol (TENORMIN) 50 MG tablet Take 1 tablet (50 mg total) by mouth daily. 10/15/17  Yes Unk Pinto, MD  budesonide (PULMICORT) 0.5 MG/2ML nebulizer solution Take 2 mLs (0.5 mg total) by nebulization 2 (two) times daily. 03/20/17  Yes [name], PA-C  Cholecalciferol (VITAMIN D PO) Take 2,000 Units by mouth daily.    Yes [provider]  diazepam (VALIUM) 5 MG tablet Take one tablet up to 2 x a daily for anxiety AS NEEDED 03/20/17  Yes [name], PA-C  diltiazem (CARDIZEM CD) 300 MG 24 hr capsule TAKE 1 CAPSULE (300 MG TOTAL) BY MOUTH DAILY. 06/21/17  Yes Unk Pinto, MD  DULoxetine (CYMBALTA) 60 MG capsule TAKE 1 CAPSULE BY MOUTH DAILY Patient taking differently: TAKE 60 mg  CAPSULE BY MOUTH DAILY 08/30/17  Yes Unk Pinto, MD  ferrous sulfate 325 (65 FE) MG tablet Take 325 mg by mouth 2 (two) times daily with a meal.   Yes [provider]  fluticasone (FLONASE) 50 MCG/ACT nasal spray Place 2 sprays into both nostrils daily. Patient taking differently: Place 2 sprays into both nostrils daily as needed for allergies.  10/29/16  Yes  Forcucci, Courtney, PA-C  furosemide (LASIX) 80 MG tablet Take 1 tablet (80 mg total) by mouth daily. 08/12/17  Yes Unk Pinto, MD  guaiFENesin (MUCINEX) 600 MG 12 hr tablet Take 600 mg by mouth 2 (two) times daily.    Yes [provider]  ipratropium (ATROVENT) 0.02 % nebulizer solution Take 2.5 mLs (0.5 mg total) by nebulization every 6 (six) hours. 08/20/17  Yes Lavina Hamman, MD  levothyroxine (SYNTHROID, LEVOTHROID) 175 MCG tablet TAKE 1 TABLET (175 MCG TOTAL) BY MOUTH DAILY BEFORE BREAKFAST. 06/21/17  Yes Unk Pinto, MD  loratadine-pseudoephedrine (CLARITIN-D 24-HOUR) 10-240 MG per 24 hr tablet Take 1 tablet by mouth as needed for allergies.    Yes [provider]  Magnesium Chloride (MAGNESIUM DR PO) Take 1 capsule by mouth daily.    Yes [provider]  metFORMIN (GLUCOPHAGE-XR) 500 MG 24 hr tablet TAKE 1 TABLET (500  MG TOTAL) BY MOUTH 4 (FOUR) TIMES DAILY - AFTER MEALS AND AT BEDTIME. 04/29/17  Yes Unk Pinto, MD  metolazone (ZAROXOLYN) 5 MG tablet Take 1 pill 1 hour before the lasix every day, call the office monday Patient taking differently: Take 5 mg by mouth daily. Take 1 pill 1 hour before the lasix every day, call the office monday 12/05/17  Yes [name], PA-C  OXYGEN-HELIUM IN Inhale 8 L into the lungs continuous.    Yes [provider]  pantoprazole (PROTONIX) 40 MG tablet Take 1 tablet (40 mg total) by mouth daily at 6 (six) AM. 10/13/17  Yes Unk Pinto, MD  polyethylene glycol (MIRALAX / Floria Raveling) packet Take 17 g by mouth 2 (two) times daily. 08/20/17  Yes Lavina Hamman, MD  pravastatin (PRAVACHOL) 40 MG tablet TAKE 1 TABLET BY MOUTH EVERY DAY Patient taking differently: TAKE 40 mg TABLET BY MOUTH EVERY DAY 11/25/17  Yes Liane Comber, NP    Family History Family History  Problem Relation Age of Onset  . Alcohol abuse Father   . Heart disease Father     Social History Social History   Tobacco Use  .  Smoking status: Former Smoker    Packs/day: 0.50    Years: 44.00    Pack years: 22.00    Types: Cigarettes    Last attempt to quit: 07/31/2016    Years since quitting: 1.3  . Smokeless tobacco: Never Used  Substance Use Topics  . Alcohol use: No    Alcohol/week: 0.0 oz  . Drug use: No     Allergies   Inderal [propranolol] and Ace inhibitors   Review of Systems Review of Systems  All other systems reviewed and are negative.    Physical Exam Updated Vital Signs BP 120/75 (BP Location: Right Arm)   Pulse 79   Temp 98 F (36.7 C) (Axillary)   Resp (!) 21   Ht 5\' 2"  (1.575 m)   Wt 110.7 kg (244 lb)   SpO2 90%   BMI 44.63 kg/m   Physical Exam  Constitutional:  Obese female appears uncomfortable wearing a  CPAP, sitting upright  Eyes: Pupils are equal, round, and reactive to light. EOM are normal.  Neck: No JVD present.  Cardiovascular: Normal rate and regular rhythm.  Pulmonary/Chest:  Decreased breath sounds with poor effort, no obvious wheeze, rales, rhonchi heard  Abdominal: Soft. There is no tenderness.  Musculoskeletal: She exhibits edema (2+ pitting edema to bilateral lower extremities.  Evidence of chronic venous stasis involving bilateral feet.).  Neurological: She is alert.  Skin: Skin is warm.  Psychiatric: She has a normal mood and affect.     ED Treatments / Results  Labs (all labs ordered are listed, but only abnormal results are displayed) Labs Reviewed  BASIC METABOLIC PANEL - Abnormal; Notable for the following components:      Result Value   Potassium 2.9 (*)    Chloride 82 (*)    CO2 41 (*)    Glucose, Bld 110 (*)    BUN 33 (*)    Creatinine, Ser 1.40 (*)    GFR calc non Af Amer 37 (*)    GFR calc Af Amer 43 (*)    Anion gap 17 (*)    All other components within normal limits  CBC WITH DIFFERENTIAL/PLATELET - Abnormal; Notable for the following components:   Hemoglobin 11.5 (*)    MCH 24.0 (*)    MCHC 28.1 (*)    RDW  18.6 (*)     All other components within normal limits  I-STAT ARTERIAL BLOOD GAS, ED - Abnormal; Notable for the following components:   pH, Arterial 7.463 (*)    pCO2 arterial 68.6 (*)    pO2, Arterial 51.0 (*)    Bicarbonate 49.1 (*)    TCO2 >50 (*)    Acid-Base Excess 21.0 (*)    All other components within normal limits  BRAIN NATRIURETIC PEPTIDE  I-STAT TROPONIN, ED    EKG EKG Interpretation  Date/Time:  Friday December 12 2017 12:47:03 EDT Ventricular Rate:  79 PR Interval:    QRS Duration: 169 QT Interval:  440 QTC Calculation: 505 R Axis:   163 Text Interpretation:  Normal sinus rhythm replaced a flutter RBBB and LPFB Otherwise no significant change Confirmed by Deno Etienne [phone]) on 12/12/2017 3:21:31 PM  ED ECG REPORT   Radiology Dg Chest Port 1 View  Result Date: 12/12/2017 CLINICAL DATA:  Shortness of breath, fluid retention. History of CHF, COPD, diabetes, hypertension. Former smoker. EXAM: PORTABLE CHEST 1 VIEW COMPARISON:  Chest x-rays dated 08/15/2017, 11/11/2016 and 07/31/2016. FINDINGS: Stable cardiomegaly. Aortic atherosclerosis. Diffuse interstitial prominence is stable. Additional patchy opacities at the lung bases are not significantly changed, presumed chronic atelectasis and/or pleural thickening. No pleural effusion or pneumothorax seen. IMPRESSION: 1. Stable interstitial prominence, similar to multiple previous chest x-rays suggesting chronic interstitial lung disease. No evidence of active CHF/pulmonary edema. 2. Stable bibasilar opacities, most likely chronic atelectasis and/or pleural thickening. No evidence of pneumonia or pleural effusion. 3. Stable cardiomegaly. 4. Aortic atherosclerosis. Electronically Signed   By: Franki Cabot M.D.   On: 12/12/2017 14:20    Procedures Procedures (including critical care time)  Medications Ordered in ED Medications  albuterol (PROVENTIL) (2.5 MG/3ML) 0.083% nebulizer solution 5 mg (has no administration in time range)    ipratropium (ATROVENT) nebulizer solution 0.5 mg (has no administration in time range)  potassium chloride SA (K-DUR,KLOR-CON) CR tablet 40 mEq (has no administration in time range)  potassium chloride 10 mEq in 100 mL IVPB (has no administration in time range)     Initial Impression / Assessment and Plan / ED Course  I have reviewed the triage vital signs and the nursing notes.  Pertinent labs & imaging results that were available during my care of the patient were reviewed by me and considered in my medical decision making (see chart for details).     BP 105/72   Pulse 80   Temp 98 F (36.7 C) (Axillary)   Resp 17   Ht 5\' 2"  (1.575 m)   Wt 110.7 kg (244 lb)   SpO2 (!) 85%   BMI 44.63 kg/m    Final Clinical Impressions(s) / ED Diagnoses   Final diagnoses:  Chronic respiratory failure with hypercapnia (HCC)  Acute on chronic systolic congestive heart failure (HCC)  Hypokalemia    ED Discharge Orders    None     1:59 PM Patient here with increased fluid retention in the setting of CHF and having progressive worsening shortness of breath.  She has home O2 dependent currently at 8 L.  Currently her O2 sats at 85%'s on CPAP.  She does appear to be fluid overload.  Workup initiated, anticipate admission for further management. Picture of hypercapnic respiratory failure.  Care discussed with Dr. Zoe Lan.   3:13 PM Arterial blood gas obtained with pH 7.46, CO2 68 , bicarb 49.  K+ 2.9, will give potassium supplementation.  Chest x-ray without any  acute changes. Cr 1.4.  BNP 978.  Pt will benefit from admission.   3:46 PM Appreciate consultation from [facility] Dr. Aggie Moats who agrees to see and admit pt for further management of her condition.  Pt is aware and agrees with plan.    Domenic Moras, PA-C 12/12/17 [place], MD 12/20/17 [phone]

## 2017-12-12 NOTE — ED Notes (Signed)
Pt states she is a former smoker, son smokes in the house

## 2017-12-12 NOTE — ED Notes (Signed)
Pt receive 10 mEq potassium in 100 mL ns

## 2017-12-12 NOTE — Progress Notes (Signed)
Critical ABG results given to MD. No new orders at this time

## 2017-12-12 NOTE — Consult Note (Signed)
Name: Jillian Wood MRN: [id number] DOB: [birth year]    ADMISSION DATE:  12/12/2017 CONSULTATION DATE:  12/12/17  REFERRING MD :  Aggie Moats  CHIEF COMPLAINT:  SOB   HISTORY OF PRESENT ILLNESS:  Jillian Wood is a 72 y.o. female with a PMH as outlined below including COPD / obstructive lung disease, restrictive lung disease, chronic hypoxia and hypercapnia (was on 5L but recently has had to increase to 8L O2 as well as nocturnal BiPAP) for which she has been seen by Dr. Halford Chessman (last seen April 2018 and scheduled to see again January 07, 2018).  PFT's from April 2018 showed severe obstruction and restriction (FVC 37%, FEV1 37%).    She sees her PCP weekly for basic lab work given that she is on lasix and diamox daily.  She went for routine checkup on 3/28 and had lab work as usual.  On 3/29, she received call from PCP telling her that she had hypercapnia, hypokalemia and AKI; therefore, she needed to come to ED for further evaluation.  She also had hypoxia during her visit with SpO2 of 81% despite 10L O2 via [place].  Recently, she has had weight gain and increased O2 requirements, now 8L O2 which is up from 5L previously.  She also uses BiPAP nocturnally.  She has hax lasix increased from 80mg  daily to 160mg  daily and had zaroxyln added for a few days.  In ED 3/29, she was found to have acute on chronic hypoxemic and hypercapnic respiratory failure (ABG 7.46 / 68 / 51), hypokalemia (2.9), AKI (1.4).  CXR showed chronic interstitial changes as well as some interstitial edema. She denies any recent fevers/chills/sweats, chest pain, dyspnea, N/V/D, abd pain, myalgias, exposures to known sick contacts, recent travel.  She does endorse increase in LE edema and orthopnea.  States she has been taking her meds as prescribed and has not missed any doses.  Son confirms this to be true as he lives with her and cares for her daily.  PCP has apparently suggested that family meet with palliative care; however, pt is not  ready for this therefore they have not pursued it.  She was admitted by [facility] and PCCM was asked to see in consultation.  She has been on BiPAP since early AM 3/29 and remains on it now.   PAST MEDICAL HISTORY :   has a past medical history of Arthritis, CHF (congestive heart failure) ([place]), COPD (chronic obstructive pulmonary disease) ([place]), Depression, Diabetes mellitus type 2 in obese ([place]), GERD (gastroesophageal reflux disease), Hyperlipidemia, Hypertension, Morbid obesity ([place]), OAB (overactive bladder), PSVT (paroxysmal supraventricular tachycardia) ([place]), Thyroid disease, and Vitamin D deficiency.  has a past surgical history that includes Carpal tunnel release; Eye surgery (Bilateral); Pubovaginal sling; Tonsillectomy and adenoidectomy; transthoracic echocardiogram (01/2011); NM MYOVIEW LTD (01/2011); TEE without cardioversion (N/A, 12/16/2016); and Colonoscopy (N/A, 08/19/2017). Prior to Admission medications   Medication Sig Start Date End Date Taking? Authorizing Provider  Acetaminophen (TYLENOL 8 HOUR ARTHRITIS PAIN PO) Take 1 tablet by mouth. PRN   Yes [provider]  acetaminophen-codeine (TYLENOL #3) 300-30 MG tablet Take 1 tablet by mouth every 8 (eight) hours as needed for moderate pain or severe pain. 11/19/16  Yes [name], PA-C  acetaZOLAMIDE (DIAMOX) 250 MG tablet TAKE 1 TABLET (250 MG TOTAL) BY MOUTH 3 (THREE) TIMES DAILY. 03/23/17  Yes Unk Pinto, MD  albuterol (PROVENTIL HFA;VENTOLIN HFA) 108 (90 Base) MCG/ACT inhaler Inhale 1-2 puffs into the lungs every 6 (six) hours as needed for  wheezing or shortness of breath. 05/30/17  Yes [name], PA-C  albuterol (PROVENTIL) (2.5 MG/3ML) 0.083% nebulizer solution Take 3 mLs (2.5 mg total) by nebulization every 6 (six) hours as needed for wheezing or shortness of breath. 11/10/17  Yes [name], PA-C  aspirin 81 MG tablet Take 81 mg by mouth daily.   Yes [provider]  atenolol (TENORMIN) 50 MG  tablet Take 1 tablet (50 mg total) by mouth daily. 10/15/17  Yes Unk Pinto, MD  budesonide (PULMICORT) 0.5 MG/2ML nebulizer solution Take 2 mLs (0.5 mg total) by nebulization 2 (two) times daily. 03/20/17  Yes [name], PA-C  Cholecalciferol (VITAMIN D PO) Take 2,000 Units by mouth daily.    Yes [provider]  diazepam (VALIUM) 5 MG tablet Take one tablet up to 2 x a daily for anxiety AS NEEDED 03/20/17  Yes [name], PA-C  diltiazem (CARDIZEM CD) 300 MG 24 hr capsule TAKE 1 CAPSULE (300 MG TOTAL) BY MOUTH DAILY. 06/21/17  Yes Unk Pinto, MD  DULoxetine (CYMBALTA) 60 MG capsule TAKE 1 CAPSULE BY MOUTH DAILY Patient taking differently: TAKE 60 mg  CAPSULE BY MOUTH DAILY 08/30/17  Yes Unk Pinto, MD  ferrous sulfate 325 (65 FE) MG tablet Take 325 mg by mouth 2 (two) times daily with a meal.   Yes [provider]  fluticasone (FLONASE) 50 MCG/ACT nasal spray Place 2 sprays into both nostrils daily. Patient taking differently: Place 2 sprays into both nostrils daily as needed for allergies.  10/29/16  Yes Forcucci, Courtney, PA-C  furosemide (LASIX) 80 MG tablet Take 1 tablet (80 mg total) by mouth daily. 08/12/17  Yes Unk Pinto, MD  guaiFENesin (MUCINEX) 600 MG 12 hr tablet Take 600 mg by mouth 2 (two) times daily.    Yes [provider]  ipratropium (ATROVENT) 0.02 % nebulizer solution Take 2.5 mLs (0.5 mg total) by nebulization every 6 (six) hours. 08/20/17  Yes Lavina Hamman, MD  levothyroxine (SYNTHROID, LEVOTHROID) 175 MCG tablet TAKE 1 TABLET (175 MCG TOTAL) BY MOUTH DAILY BEFORE BREAKFAST. 06/21/17  Yes Unk Pinto, MD  loratadine-pseudoephedrine (CLARITIN-D 24-HOUR) 10-240 MG per 24 hr tablet Take 1 tablet by mouth as needed for allergies.    Yes [provider]  Magnesium Chloride (MAGNESIUM DR PO) Take 1 capsule by mouth daily.    Yes [provider]  metFORMIN (GLUCOPHAGE-XR) 500 MG 24 hr tablet TAKE 1 TABLET  (500 MG TOTAL) BY MOUTH 4 (FOUR) TIMES DAILY - AFTER MEALS AND AT BEDTIME. 04/29/17  Yes Unk Pinto, MD  metolazone (ZAROXOLYN) 5 MG tablet Take 1 pill 1 hour before the lasix every day, call the office monday Patient taking differently: Take 5 mg by mouth daily. Take 1 pill 1 hour before the lasix every day, call the office monday 12/05/17  Yes [name], PA-C  OXYGEN-HELIUM IN Inhale 8 L into the lungs continuous.    Yes [provider]  pantoprazole (PROTONIX) 40 MG tablet Take 1 tablet (40 mg total) by mouth daily at 6 (six) AM. 10/13/17  Yes Unk Pinto, MD  polyethylene glycol (MIRALAX / Floria Raveling) packet Take 17 g by mouth 2 (two) times daily. 08/20/17  Yes Lavina Hamman, MD  pravastatin (PRAVACHOL) 40 MG tablet TAKE 1 TABLET BY MOUTH EVERY DAY Patient taking differently: TAKE 40 mg TABLET BY MOUTH EVERY DAY 11/25/17  Yes Liane Comber, NP   Allergies  Allergen Reactions  . Inderal [Propranolol] Other (See Comments)    Hair  loss  . Ace Inhibitors Cough    FAMILY HISTORY:  family history includes Alcohol abuse in her father; Heart disease in her father. SOCIAL HISTORY:  reports that she quit smoking about 16 months ago. Her smoking use included cigarettes. She has a 22.00 pack-year smoking history. She has never used smokeless tobacco. She reports that she does not drink alcohol or use drugs.  REVIEW OF SYSTEMS:   All negative; except for those that are bolded, which indicate positives.  Constitutional: weight loss, weight gain, night sweats, fevers, chills, fatigue, weakness.  HEENT: headaches, sore throat, sneezing, nasal congestion, post nasal drip, difficulty swallowing, tooth/dental problems, visual complaints, visual changes, ear aches. Neuro: difficulty with speech, weakness, numbness, ataxia. CV:  chest pain, orthopnea, PND, swelling in lower extremities, dizziness, palpitations, syncope.  Resp: cough, hemoptysis, dyspnea, wheezing. GI: heartburn,  indigestion, abdominal pain, nausea, vomiting, diarrhea, constipation, change in bowel habits, loss of appetite, hematemesis, melena, hematochezia.  GU: dysuria, change in color of urine, urgency or frequency, flank pain, hematuria. MSK: joint pain or swelling, decreased range of motion. Psych: change in mood or affect, depression, anxiety, suicidal ideations, homicidal ideations. Skin: rash, itching, bruising.   SUBJECTIVE:  Wants a break from BiPAP so that she can eat.  VITAL SIGNS: Temp:  [98 F (36.7 C)] 98 F (36.7 C) (03/29 1256) Pulse Rate:  [77-80] 78 (03/29 1615) Resp:  [15-25] 25 (03/29 1615) BP: (104-120)/(60-75) 104/69 (03/29 1615) SpO2:  [83 %-92 %] 92 % (03/29 1615) Weight:  [110.7 kg (244 lb)] 110.7 kg (244 lb) (03/29 1245)  PHYSICAL EXAMINATION: General: Obese female, chronically ill appearing, resting in bed, in NAD. Neuro: A&O x 3, non-focal.  HEENT: [place]/AT. EOMI, sclerae anicteric. Cardiovascular: RRR, no M/R/G.  Lungs: Respirations even and unlabored.  Very faint end expiratory wheeze. On BiPAP. Abdomen: Obese, BS x 4, soft, NT/ND.  Musculoskeletal: No gross deformities, 2+ pitting edema to knees bilaterally. Skin: Intact, warm, no rashes.    Recent Labs  Lab 12/11/17 1633 12/12/17 1407  NA 142 140  K 3.4* 2.9*  CL 86* 82*  CO2 >45* 41*  BUN 33* 33*  CREATININE 1.36* 1.40*  GLUCOSE 127* 110*   Recent Labs  Lab 12/11/17 1633 12/12/17 1407  HGB 12.0 11.5*  HCT 41.1 40.9  WBC 6.7 6.3  PLT 284 256   Dg Chest Port 1 View  Result Date: 12/12/2017 CLINICAL DATA:  Shortness of breath, fluid retention. History of CHF, COPD, diabetes, hypertension. Former smoker. EXAM: PORTABLE CHEST 1 VIEW COMPARISON:  Chest x-rays dated 08/15/2017, 11/11/2016 and 07/31/2016. FINDINGS: Stable cardiomegaly. Aortic atherosclerosis. Diffuse interstitial prominence is stable. Additional patchy opacities at the lung bases are not significantly changed, presumed chronic  atelectasis and/or pleural thickening. No pleural effusion or pneumothorax seen. IMPRESSION: 1. Stable interstitial prominence, similar to multiple previous chest x-rays suggesting chronic interstitial lung disease. No evidence of active CHF/pulmonary edema. 2. Stable bibasilar opacities, most likely chronic atelectasis and/or pleural thickening. No evidence of pneumonia or pleural effusion. 3. Stable cardiomegaly. 4. Aortic atherosclerosis. Electronically Signed   By: Franki Cabot M.D.   On: 12/12/2017 14:20    STUDIES:  CXR 3/29 > stable interstitial changes, some interstitial edema.  SIGNIFICANT EVENTS  3/29 > admit.  ASSESSMENT / PLAN:  Acute on chronic hypoxemic and hypercapnic respiratory failure - wears 8L O2 via [place] daily as well as nocturnal BiPAP (and during naps).  Likely exacerbated by volume overload / CHF exacerbation. COPD with possible acute exacerbation. R/o viral  process. Plan: Continue supplemental O2 as needed to maintain SpO2 > 88% (does not need to be higher). Continue nocturnal BiPAP (and during naps) - would NOT aim for normo-capnia as pt is chronically hypercapnic and is almost fully compensated. Agree with aggressive diuresis, continue as BP and SCr permit with goal neg balance. Continue steroids (dose lowered as she has minimal bronchospasm), BD's. Check RVP. Bronchial hygiene. Follow CXR intermittently. Has follow up with Dr. Halford Chessman already scheduled for 01/07/18.  Rest per primary team.   Montey Hora, [place] [facility] [place] [facility] Pager: [phone]  or [phone] 12/12/2017, 5:05 PM

## 2017-12-12 NOTE — ED Notes (Signed)
Purwick applied 

## 2017-12-12 NOTE — ED Triage Notes (Signed)
Pt arrives to ED from home with complaints of SOB, PCP called this morning because "her labs were off and oxygen was bad". Pt arrives 83% on trilogy Cipap pt brought form home. Respiratory therapy bedside, found filter that was past due for cleaning. Pt placed in position of comfort with bed locked and lowered, call bell in reach.

## 2017-12-13 ENCOUNTER — Inpatient Hospital Stay (HOSPITAL_COMMUNITY): Payer: PPO

## 2017-12-13 ENCOUNTER — Encounter (HOSPITAL_COMMUNITY): Payer: Self-pay

## 2017-12-13 DIAGNOSIS — J9602 Acute respiratory failure with hypercapnia: Secondary | ICD-10-CM

## 2017-12-13 DIAGNOSIS — J9601 Acute respiratory failure with hypoxia: Secondary | ICD-10-CM

## 2017-12-13 DIAGNOSIS — J9621 Acute and chronic respiratory failure with hypoxia: Secondary | ICD-10-CM

## 2017-12-13 DIAGNOSIS — J9612 Chronic respiratory failure with hypercapnia: Secondary | ICD-10-CM

## 2017-12-13 DIAGNOSIS — L8990 Pressure ulcer of unspecified site, unspecified stage: Secondary | ICD-10-CM

## 2017-12-13 DIAGNOSIS — I5023 Acute on chronic systolic (congestive) heart failure: Secondary | ICD-10-CM

## 2017-12-13 DIAGNOSIS — E876 Hypokalemia: Secondary | ICD-10-CM

## 2017-12-13 LAB — GLUCOSE, CAPILLARY
Glucose-Capillary: 125 mg/dL — ABNORMAL HIGH (ref 65–99)
Glucose-Capillary: 110 mg/dL — ABNORMAL HIGH (ref 65–99)
Glucose-Capillary: 133 mg/dL — ABNORMAL HIGH (ref 65–99)
Glucose-Capillary: 225 mg/dL — ABNORMAL HIGH (ref 65–99)
Glucose-Capillary: 223 mg/dL — ABNORMAL HIGH (ref 65–99)

## 2017-12-13 LAB — BASIC METABOLIC PANEL
Anion gap: 10 (ref 5–15)
BUN: <5 mg/dL — ABNORMAL LOW (ref 6–20)
CALCIUM: 8.3 mg/dL — AB (ref 8.9–10.3)
CHLORIDE: 102 mmol/L (ref 101–111)
CO2: 26 mmol/L (ref 22–32)
Creatinine, Ser: 0.82 mg/dL (ref 0.44–1.00)
GFR calc Af Amer: >60 mL/min (ref >60)
Glucose, Bld: 89 mg/dL (ref 65–99)
Potassium: 3.5 mmol/L (ref 3.5–5.1)
SODIUM: 138 mmol/L (ref 135–145)

## 2017-12-13 LAB — RESPIRATORY PANEL BY PCR
Adenovirus: NOT DETECTED
Bordetella pertussis: NOT DETECTED
CORONAVIRUS OC43-RVPPCR: NOT DETECTED
Chlamydophila pneumoniae: NOT DETECTED
Coronavirus 229E: NOT DETECTED
Coronavirus HKU1: NOT DETECTED
Coronavirus NL63: NOT DETECTED
INFLUENZA A-RVPPCR: NOT DETECTED
INFLUENZA B-RVPPCR: NOT DETECTED
METAPNEUMOVIRUS-RVPPCR: NOT DETECTED
MYCOPLASMA PNEUMONIAE-RVPPCR: NOT DETECTED
PARAINFLUENZA VIRUS 1-RVPPCR: NOT DETECTED
PARAINFLUENZA VIRUS 2-RVPPCR: NOT DETECTED
PARAINFLUENZA VIRUS 4-RVPPCR: NOT DETECTED
Parainfluenza Virus 3: NOT DETECTED
RESPIRATORY SYNCYTIAL VIRUS-RVPPCR: NOT DETECTED
Rhinovirus / Enterovirus: NOT DETECTED

## 2017-12-13 LAB — CBC WITH DIFFERENTIAL/PLATELET
BASOS ABS: 0 10*3/uL (ref 0.0–0.1)
Basophils Relative: 0 %
EOS PCT: 1 %
Eosinophils Absolute: 0.1 10*3/uL (ref 0.0–0.7)
HEMATOCRIT: 39.8 % (ref 36.0–46.0)
Hemoglobin: 11 g/dL — ABNORMAL LOW (ref 12.0–15.0)
LYMPHS PCT: 20 %
Lymphs Abs: 1.1 10*3/uL (ref 0.7–4.0)
MCH: 23.5 pg — ABNORMAL LOW (ref 26.0–34.0)
MCHC: 27.6 g/dL — AB (ref 30.0–36.0)
MCV: 84.9 fL (ref 78.0–100.0)
MONOS PCT: 9 %
Monocytes Absolute: 0.5 10*3/uL (ref 0.1–1.0)
NEUTROS ABS: 3.9 10*3/uL (ref 1.7–7.7)
Neutrophils Relative %: 70 %
PLATELETS: 247 10*3/uL (ref 150–400)
RBC: 4.69 MIL/uL (ref 3.87–5.11)
RDW: 18.7 % — ABNORMAL HIGH (ref 11.5–15.5)
WBC: 5.6 10*3/uL (ref 4.0–10.5)

## 2017-12-13 LAB — HIV ANTIBODY (ROUTINE TESTING W REFLEX): HIV Screen 4th Generation wRfx: NONREACTIVE

## 2017-12-13 LAB — MRSA PCR SCREENING: MRSA by PCR: NEGATIVE

## 2017-12-13 MED ORDER — ORAL CARE MOUTH RINSE
15.0000 mL | Freq: Two times a day (BID) | OROMUCOSAL | Status: DC
  Administered 2017-12-14 – 2017-12-15 (×2): 15 mL via OROMUCOSAL

## 2017-12-13 MED ORDER — CHLORHEXIDINE GLUCONATE 0.12 % MT SOLN
15.0000 mL | Freq: Two times a day (BID) | OROMUCOSAL | Status: DC
  Administered 2017-12-13 – 2017-12-16 (×5): 15 mL via OROMUCOSAL
  Filled 2017-12-13 (×7): qty 15

## 2017-12-13 MED ORDER — ALBUTEROL SULFATE (2.5 MG/3ML) 0.083% IN NEBU: 2.5000 mg | INHALATION_SOLUTION | RESPIRATORY_TRACT | Status: DC | PRN

## 2017-12-13 MED ORDER — FUROSEMIDE 10 MG/ML IJ SOLN
40.0000 mg | Freq: Two times a day (BID) | INTRAMUSCULAR | Status: DC
  Administered 2017-12-14: 40 mg via INTRAVENOUS
  Filled 2017-12-13 (×2): qty 4

## 2017-12-13 NOTE — Progress Notes (Signed)
PROGRESS NOTE    Jillian Wood  UYQ:[id number] DOB: March 01, 1946 DOA: 12/12/2017 PCP: Unk Pinto, MD    Brief Narrative:  72 yo female who presented with worsening hypoxemia. She does have a significant past medical history for congestive heart failure, COPD, hypertension, morbid obesity and chronic hypoxic respiratory failure, on supplemental oxygen 8 L/m. Patient was found to have severe hypoxemia, transferred to the hospital for further evaluation. On the initial physical examination blood pressure 105/72, heart rate 80, respiratory rate 25, oxygen saturation 85-88%, moist mucous membranes, heart S1-S2 present, rhythmic, no gallops, rubs or murmurs, lungs with decreased breath sounds, abdomen protuberant, no lower extremity edema. Sodium 140, potassium 2.9, chloride 82, bicarbonate 41, glucose 110, BUN 33, creatinine 1.40, BNP 978, white count 6.3, hemoglobin 11.5, hematocrit 40.9, platelets 256, arterial blood gas 7.46, 68.6, 51.0 49.1, 86%. Viral respiratory panel negative.Chest film with bilateral interstitial markings and small bilateral pleural effusion. EKG atrial flutter rhythm, 79 bpm,right axis deviation, right bundle branch block, lateral T-wave inversions.   Patient was admitted to the hospital with the working diagnosis of acute on chronic hypoxic respiratory failure.  Assessment & Plan:   Active Problems:   Respiratory distress   Acute respiratory failure (HCC)   Pressure injury of skin   1. Acute on chronic hypoxic respiratory failure. Will continue diuresis with furosemide to target negative fluid balance, continue oxymetry monitoring and supplemental 02 per [place]. No clinical signs of pneumonia, will hold on further antibiotic therapy. Continue on acetazolamide.   2. Congestive heart failure. Continue diuresis with furosemide and metolazone, strict in and out, continue heart failure management with atenolol, urine output 2,400 ml since amddision. Follow renal panel.    3. COPD. No significant wheezing, will hold on systemic steroids and will continue with bronchodilator therapy. Continue budesonide.   4. Atrial flutter. Will continue rate control, old records personally reviewed, patient off anticoagulation due to history of GI bleeding. Continue rate control with atenolol.   5. Morbid obesity with OSA. Out of bed as tolerated, physical therapy, will need outpatient follow up. Continue C pap.   6. T2DM. Continue glucose cover and monitoring with insulin sliding scale. Capillary glucose 101, 115, 125, 110, 133. Patient tolerating po well.   7. Hypothyroid. Will continue levothyroxine.    DVT prophylaxis: enoxaparin   Code Status:  full Family Communication: no family at the bedside Disposition Plan: home when clinically improved   Consultants:      Procedures:     Antimicrobials:       Subjective: Patient somnolent this am on cpap. Feeling better, dyspnea has improved but still not back to baseline, no cough or chest pain, no nausea or vomiting.   Objective: Vitals:   12/12/17 1857 12/12/17 2057 12/12/17 2300 12/13/17 0327  BP: (!) 103/56  95/60 104/61  Pulse: 73  76 81  Resp:   16 15  Temp: 98.2 F (36.8 C)  (!) 97.5 F (36.4 C) (!) 97.4 F (36.3 C)  TempSrc: Oral  Axillary Axillary  SpO2: 90% 91% 96% 97%  Weight:    114.8 kg (253 lb 1.4 oz)  Height:        Intake/Output Summary (Last 24 hours) at 12/13/2017 0751 Last data filed at 12/13/2017 0500 Gross per 24 hour  Intake 60 ml  Output 2400 ml  Net -2340 ml   Filed Weights   12/12/17 1245 12/13/17 0327  Weight: 110.7 kg (244 lb) 114.8 kg (253 lb 1.4 oz)    Examination:  General: Not in pain or dyspnea, deconditioned Neurology: somnolent, easy to arouse, non focal  E ENT: no pallor, no icterus, oral mucosa moist/ wide neck.  Cardiovascular: No JVD. S1-S2 present, rhythmic, no gallops, rubs, or murmurs. No lower extremity edema. Pulmonary: decreased breath sounds  bilaterally at bases, adequate air movement, no wheezing, rhonchi or rales. Gastrointestinal. Abdomen protuberant, no organomegaly, non tender, no rebound or guarding Skin. No rashes Musculoskeletal: no joint deformities     Data Reviewed: I have personally reviewed following labs and imaging studies  CBC: Recent Labs  Lab 12/11/17 1633 12/12/17 1407 12/12/17 1827 12/13/17 0311  WBC 6.7 6.3 5.7 5.6  NEUTROABS 5,327 4.9  --  3.9  HGB 12.0 11.5* 11.2* 11.0*  HCT 41.1 40.9 41.2 39.8  MCV 80.4 85.2 85.3 84.9  PLT 284 256 277 619   Basic Metabolic Panel: Recent Labs  Lab 12/11/17 1633 12/12/17 1407 12/12/17 1827 12/13/17 0311  NA 142 140  --  138  K 3.4* 2.9*  --  3.5  CL 86* 82*  --  102  CO2 >45* 41*  --  26  GLUCOSE 127* 110*  --  89  BUN 33* 33*  --  <5*  CREATININE 1.36* 1.40* 1.39* 0.82  CALCIUM 9.9 9.4  --  8.3*  MG 1.7  --   --   --    GFR: Estimated Creatinine Clearance: 75.5 mL/min (by C-G formula based on SCr of 0.82 mg/dL). Liver Function Tests: No results for input(s): AST, ALT, ALKPHOS, BILITOT, PROT, ALBUMIN in the last 168 hours. No results for input(s): LIPASE, AMYLASE in the last 168 hours. No results for input(s): AMMONIA in the last 168 hours. Coagulation Profile: No results for input(s): INR, PROTIME in the last 168 hours. Cardiac Enzymes: No results for input(s): CKTOTAL, CKMB, CKMBINDEX, TROPONINI in the last 168 hours. BNP (last 3 results) No results for input(s): PROBNP in the last 8760 hours. HbA1C: No results for input(s): HGBA1C in the last 72 hours. CBG: Recent Labs  Lab 12/12/17 2347 12/13/17 0325  GLUCAP 115* 125*   Lipid Profile: No results for input(s): CHOL, HDL, LDLCALC, TRIG, CHOLHDL, LDLDIRECT in the last 72 hours. Thyroid Function Tests: No results for input(s): TSH, T4TOTAL, FREET4, T3FREE, THYROIDAB in the last 72 hours. Anemia Panel: No results for input(s): VITAMINB12, FOLATE, FERRITIN, TIBC, IRON, RETICCTPCT in  the last 72 hours.    Radiology Studies: I have reviewed all of the imaging during this hospital visit personally     Scheduled Meds: . acetaZOLAMIDE  250 mg Oral TID  . aspirin  81 mg Oral Daily  . atenolol  50 mg Oral Daily  . budesonide (PULMICORT) nebulizer solution  0.5 mg Nebulization BID  . chlorhexidine  15 mL Mouth Rinse BID  . DULoxetine  60 mg Oral Daily  . guaiFENesin  1,200 mg Oral BID  . heparin  5,000 Units Subcutaneous Q8H  . insulin aspart  0-20 Units Subcutaneous Q4H  . ipratropium-albuterol  3 mL Nebulization TID  . levothyroxine  175 mcg Oral QAC breakfast  . mouth rinse  15 mL Mouth Rinse q12n4p  . methylPREDNISolone (SOLU-MEDROL) injection  40 mg Intravenous Daily  . metolazone  5 mg Oral Daily  . pantoprazole  40 mg Oral Q0600  . pravastatin  40 mg Oral Daily   Continuous Infusions: . cefTRIAXone (ROCEPHIN)  IV    . furosemide Stopped (12/13/17 0230)     LOS: 1 day  Mauricio Gerome Apley, MD [facility] Pager [phone]

## 2017-12-13 NOTE — Progress Notes (Signed)
STAFF NOTE: I, Dr Ann Lions have personally reviewed patient's available data, including medical history, events of note, physical examination and test results as part of my evaluation. I have discussed with resident/NP and other care providers such as pharmacist, RN and RRT.  In addition,  I personally evaluated patient and elicited key findings of   S: see APP Tammy notes - patient better. Dec 2018 admit for acute diast chf. BAsline 5L [place]  O: sitting in side of bed and eating lunch with o2 on Full setneces  cxr - very wet  Recent Labs  Lab 12/12/17 1433  PHART 7.463*  PCO2ART 68.6*  PO2ART 51.0*  HCO3 49.1*  TCO2 >50*  O2SAT 86.0   Recent Labs  Lab 12/11/17 1633 12/12/17 1407 12/12/17 1827 12/13/17 0311  CREATININE 1.36* 1.40* 1.39* 0.82   .   A: acute on chronic hypoxemic resp failure with hypercpnia - this is like acute on crhonic diast chf and some aecopd only  P: recommend night bipap mandatory + prn day time Agree aggressive lasix  Agree steroids Cosnider carfds consult   .  Rest per NP/medical resident whose note is outlined above and that I agree with   Dr. Brand Males, M.D., [facility].C.P Pulmonary and Critical Care Medicine Staff Physician [place] [facility] Pager: [phone], If no answer or between  15:00h - 7:00h: call [phone]  12/13/2017 2:00 PM

## 2017-12-13 NOTE — Progress Notes (Signed)
Name: Jillian Wood MRN: [id number] DOB: [birth year]    ADMISSION DATE:  12/12/2017 CONSULTATION DATE:  12/12/17  REFERRING MD :  Aggie Moats  CHIEF COMPLAINT:  SOB   HISTORY OF PRESENT ILLNESS:  Jillian Wood is a 72 y.o. female with a PMH as outlined below including COPD / obstructive lung disease, restrictive lung disease, chronic hypoxia and hypercapnia (was on 5L but recently has had to increase to 8L O2 as well as nocturnal BiPAP) for which she has been seen by Dr. Halford Chessman (last seen April 2018 and scheduled to see again January 07, 2018).  PFT's from April 2018 showed severe obstruction and restriction (FVC 37%, FEV1 37%).    She sees her PCP weekly for basic lab work given that she is on lasix and diamox daily.  She went for routine checkup on 3/28 and had lab work as usual.  On 3/29, she received call from PCP telling her that she had hypercapnia, hypokalemia and AKI; therefore, she needed to come to ED for further evaluation.  She also had hypoxia during her visit with SpO2 of 81% despite 10L O2 via [place].  Recently, she has had weight gain and increased O2 requirements, now 8L O2 which is up from 5L previously.  She also uses BiPAP nocturnally.  She has hax lasix increased from 80mg  daily to 160mg  daily and had zaroxyln added for a few days.  In ED 3/29, she was found to have acute on chronic hypoxemic and hypercapnic respiratory failure (ABG 7.46 / 68 / 51), hypokalemia (2.9), AKI (1.4).  CXR showed chronic interstitial changes as well as some interstitial edema. She denies any recent fevers/chills/sweats, chest pain, dyspnea, N/V/D, abd pain, myalgias, exposures to known sick contacts, recent travel.  She does endorse increase in LE edema and orthopnea.  States she has been taking her meds as prescribed and has not missed any doses.  Son confirms this to be true as he lives with her and cares for her daily.  PCP has apparently suggested that family meet with palliative care; however, pt is not  ready for this therefore they have not pursued it.  She was admitted by [facility] and PCCM was asked to see in consultation.    SUBJECTIVE:  Sitting on side of bed with BIPAP on , eating lunch Says she feels fine, no dyspnea.  Discussed not to eat with BIPAP on .  Suppose to be on BIPAP with naps and At bedtime  .  Good UOP with neg 2 L bal since admit .   VITAL SIGNS: Temp:  [97.4 F (36.3 C)-98.2 F (36.8 C)] 98 F (36.7 C) (03/30 1100) Pulse Rate:  [73-85] 85 (03/30 1100) Resp:  [13-25] 13 (03/30 1100) BP: (91-120)/(55-87) 104/69 (03/30 1100) SpO2:  [85 %-97 %] 97 % (03/30 1100) Weight:  [253 lb 1.4 oz (114.8 kg)] 253 lb 1.4 oz (114.8 kg) (03/30 0327)  PHYSICAL EXAMINATION: General: obese female , on BIPAP  Neuro: a/o x 3 , no focal deficits noted.  HEENT:[place]/AT  Cardiovascular: RRR , no m/r/g  Lungs: CTA , no wheezing  Abdomen: obese , soft, NT  Musculoskeletal: intact , tr to 1 edema  Skin: intact no rashes     Recent Labs  Lab 12/11/17 1633 12/12/17 1407 12/12/17 1827 12/13/17 0311  NA 142 140  --  138  K 3.4* 2.9*  --  3.5  CL 86* 82*  --  102  CO2 >45* 41*  --  26  BUN  33* 33*  --  <5*  CREATININE 1.36* 1.40* 1.39* 0.82  GLUCOSE 127* 110*  --  89   Recent Labs  Lab 12/12/17 1407 12/12/17 1827 12/13/17 0311  HGB 11.5* 11.2* 11.0*  HCT 40.9 41.2 39.8  WBC 6.3 5.7 5.6  PLT 256 277 247   Dg Chest Port 1 View  Result Date: 12/13/2017 CLINICAL DATA:  Respiratory failure EXAM: PORTABLE CHEST 1 VIEW COMPARISON:  Yesterday FINDINGS: Low lung volumes with diffuse interstitial coarsening. Asymmetric right more than left lower lung opacity, worsened on the right. Cardiomegaly. Mediastinal contours are distorted by marked rightward rotation. IMPRESSION: Low volume chest with increased atelectasis or pneumonia at the right more than left base. Increased interstitial coarsening suggesting edema. Electronically Signed   By: Monte Fantasia M.D.   On: 12/13/2017 08:32    Dg Chest Port 1 View  Result Date: 12/12/2017 CLINICAL DATA:  Shortness of breath, fluid retention. History of CHF, COPD, diabetes, hypertension. Former smoker. EXAM: PORTABLE CHEST 1 VIEW COMPARISON:  Chest x-rays dated 08/15/2017, 11/11/2016 and 07/31/2016. FINDINGS: Stable cardiomegaly. Aortic atherosclerosis. Diffuse interstitial prominence is stable. Additional patchy opacities at the lung bases are not significantly changed, presumed chronic atelectasis and/or pleural thickening. No pleural effusion or pneumothorax seen. IMPRESSION: 1. Stable interstitial prominence, similar to multiple previous chest x-rays suggesting chronic interstitial lung disease. No evidence of active CHF/pulmonary edema. 2. Stable bibasilar opacities, most likely chronic atelectasis and/or pleural thickening. No evidence of pneumonia or pleural effusion. 3. Stable cardiomegaly. 4. Aortic atherosclerosis. Electronically Signed   By: Franki Cabot M.D.   On: 12/12/2017 14:20    STUDIES:  CXR 3/29 > stable interstitial changes, some interstitial edema.  SIGNIFICANT EVENTS  3/29 > admit.  ASSESSMENT / PLAN:  Acute on chronic hypoxemic and hypercapnic respiratory failure - wears 8L O2 via [place] daily as well as nocturnal BiPAP (and during naps).  Likely exacerbated by volume overload / CHF exacerbation. COPD with possible acute exacerbation. R/o viral process.-RVP neg  Plan: Continue supplemental O2 as needed to maintain SpO2 > 88% (does not need to be higher). Continue nocturnal BiPAP (and during naps) - would NOT aim for normo-capnia as pt is chronically hypercapnic and is almost fully compensated. Agree with aggressive diuresis, continue as BP and SCr permit with goal neg balance. Continue steroids (dose lowered as she has minimal bronchospasm), BD's. Bronchial hygiene. Follow CXR intermittently. Has follow up with Dr. Halford Chessman already scheduled for 01/07/18.  Rest per primary team.  Rexene Edison NP-C  [facility]  [phone]   12/13/2017

## 2017-12-14 DIAGNOSIS — N179 Acute kidney failure, unspecified: Secondary | ICD-10-CM

## 2017-12-14 DIAGNOSIS — R0603 Acute respiratory distress: Secondary | ICD-10-CM

## 2017-12-14 LAB — GLUCOSE, CAPILLARY
GLUCOSE-CAPILLARY: 138 mg/dL — AB (ref 65–99)
GLUCOSE-CAPILLARY: 133 mg/dL — AB (ref 65–99)
GLUCOSE-CAPILLARY: 113 mg/dL — AB (ref 65–99)
GLUCOSE-CAPILLARY: 116 mg/dL — AB (ref 65–99)
Glucose-Capillary: 119 mg/dL — ABNORMAL HIGH (ref 65–99)
Glucose-Capillary: 145 mg/dL — ABNORMAL HIGH (ref 65–99)

## 2017-12-14 LAB — BASIC METABOLIC PANEL
ANION GAP: 16 — AB (ref 5–15)
BUN: 39 mg/dL — ABNORMAL HIGH (ref 6–20)
CALCIUM: 9.4 mg/dL (ref 8.9–10.3)
CHLORIDE: 83 mmol/L — AB (ref 101–111)
CO2: 40 mmol/L — AB (ref 22–32)
CREATININE: 1.51 mg/dL — AB (ref 0.44–1.00)
GFR calc non Af Amer: 34 mL/min — ABNORMAL LOW (ref >60)
GFR, EST AFRICAN AMERICAN: 39 mL/min — AB (ref >60)
Glucose, Bld: 137 mg/dL — ABNORMAL HIGH (ref 65–99)
Potassium: 2.7 mmol/L — CL (ref 3.5–5.1)
Sodium: 139 mmol/L (ref 135–145)

## 2017-12-14 MED ORDER — POTASSIUM CHLORIDE 10 MEQ/100ML IV SOLN
10.0000 meq | INTRAVENOUS | Status: AC
  Administered 2017-12-14 (×2): 10 meq via INTRAVENOUS
  Filled 2017-12-14 (×2): qty 100

## 2017-12-14 MED ORDER — POTASSIUM CHLORIDE CRYS ER 20 MEQ PO TBCR: 40.0000 meq | EXTENDED_RELEASE_TABLET | Freq: Two times a day (BID) | ORAL | Status: DC

## 2017-12-14 MED ORDER — POTASSIUM CHLORIDE CRYS ER 20 MEQ PO TBCR
40.0000 meq | EXTENDED_RELEASE_TABLET | Freq: Two times a day (BID) | ORAL | Status: AC
  Administered 2017-12-14 (×2): 40 meq via ORAL
  Filled 2017-12-14 (×2): qty 2

## 2017-12-14 NOTE — Progress Notes (Signed)
PROGRESS NOTE    Jillian Wood  VZD:[id number] DOB: 08-Feb-1946 DOA: 12/12/2017 PCP: Unk Pinto, MD    Brief Narrative:  72 yo female who presented with worsening hypoxemia. She does have a significant past medical history for congestive heart failure, COPD, hypertension, morbid obesity and chronic hypoxic respiratory failure, on supplemental oxygen 8 L/m. Patient was found to have severe hypoxemia, transferred to the hospital for further evaluation. On the initial physical examination blood pressure 105/72, heart rate 80, respiratory rate 25, oxygen saturation 85-88%, moist mucous membranes, heart S1-S2 present, rhythmic, no gallops, rubs or murmurs, lungs with decreased breath sounds, abdomen protuberant, no lower extremity edema. Sodium 140, potassium 2.9, chloride 82, bicarbonate 41, glucose 110, BUN 33, creatinine 1.40, BNP 978, white count 6.3, hemoglobin 11.5, hematocrit 40.9, platelets 256, arterial blood gas 7.46, 68.6, 51.0 49.1, 86%. Viral respiratory panel negative.Chest film with bilateral interstitial markings and small bilateral pleural effusion. EKG atrial flutter rhythm, 79 bpm,right axis deviation, right bundle branch block, lateral T-wave inversions.   Patient was admitted to the hospital with the working diagnosis of acute on chronic hypoxic respiratory failure.   Assessment & Plan:   Active Problems:   Respiratory distress   Acute respiratory failure (HCC)   Pressure injury of skin   Chronic respiratory failure with hypercapnia (HCC)   1. Acute on chronic hypoxic respiratory failure.  Improved dyspnea, patient reports to be close to baseline, will continue oxymetry monitoring and supplemental 02 per [place]. Patient has a very limited physical functional capacity, NYH class IV. Poor prognosis.   2. Congestive heart failure. Urine output 3,075 ml over last 24 hours, with improvement of her symptoms, worsening renal function per cr, will hold on diuresis for now and  follow on renal panel in am. Continue b blocker with atenolol. Old records personally reviewed, echocardiogram from 29/5188, with LV systolic function 55 to 41%.   3. AKI with hypokalemia. Suspected hypoperfusion due to over-diuresis, will hold on diuretics, will continue close follow up of renal function and electrolytes, avoid hypotension and nephrotoxic medications. Patient received kcl this am 60 meq. Hold on pantoprazole.   3. COPD with chronic hypoxic respiratory failure and pulmonary HTN. Continue with bronchodilator therapy. Continue budesonide. Oxymetry monitoring and supplemental 02 per [place]. Patient at home on 8 LPM.  4. Atrial flutter. Rate control with atenolol, no anticoagulation due to hx of GI bleeding.  5. Morbid obesity with OSA. Continue C pap, out of bed as tolerated.   6. T2DM.  Glucose cover and monitoring with insulin sliding scale. Capillary glucose 223, 138, 133, 113, 116. Systemic steroids have been discontinued.   7. Hypothyroid. Will continue levothyroxine.    DVT prophylaxis: enoxaparin   Code Status:  full Family Communication: no family at the bedside Disposition Plan: home when clinically improved   Consultants:      Procedures:     Antimicrobials:      Subjective: Patient with improved symptoms, dyspnea is close to her baseline, she dose have symptoms with lees than routine activities. On 8 LPM of supplemental 02 per [place].   Objective: Vitals:   12/14/17 0436 12/14/17 0500 12/14/17 0719 12/14/17 0744  BP: 116/81  105/67   Pulse:   76   Resp: (!) 24  18   Temp: 98.3 F (36.8 C)  98.5 F (36.9 C)   TempSrc: Oral  Oral   SpO2: 100%  100% 95%  Weight:  107.9 kg (237 lb 14 oz)    Height:  Intake/Output Summary (Last 24 hours) at 12/14/2017 0808 Last data filed at 12/14/2017 0439 Gross per 24 hour  Intake 322 ml  Output 3075 ml  Net -2753 ml   Filed Weights   12/12/17 1245 12/13/17 0327 12/14/17 0500  Weight: 110.7 kg  (244 lb) 114.8 kg (253 lb 1.4 oz) 107.9 kg (237 lb 14 oz)    Examination:   General: Not in pain or dyspnea, deconditioned  Neurology: Awake and alert, non focal  E ENT: mild pallor, no icterus, oral mucosa moist Cardiovascular: No JVD. Distant S1-S2 present, rhythmic, no gallops, rubs, or murmurs. ++/+++ pitting lower extremity edema. Pulmonary: decreased breath sounds bilaterally at bases, poor air movement, no wheezing, rhonchi or rales. Gastrointestinal. Abdomen protuberant, no organomegaly, non tender, no rebound or guarding Skin. No rashes Musculoskeletal: no joint deformities     Data Reviewed: I have personally reviewed following labs and imaging studies  CBC: Recent Labs  Lab 12/11/17 1633 12/12/17 1407 12/12/17 1827 12/13/17 0311  WBC 6.7 6.3 5.7 5.6  NEUTROABS 5,327 4.9  --  3.9  HGB 12.0 11.5* 11.2* 11.0*  HCT 41.1 40.9 41.2 39.8  MCV 80.4 85.2 85.3 84.9  PLT 284 256 277 295   Basic Metabolic Panel: Recent Labs  Lab 12/11/17 1633 12/12/17 1407 12/12/17 1827 12/13/17 0311 12/14/17 0231  NA 142 140  --  138 139  K 3.4* 2.9*  --  3.5 2.7*  CL 86* 82*  --  102 83*  CO2 >45* 41*  --  26 40*  GLUCOSE 127* 110*  --  89 137*  BUN 33* 33*  --  <5* 39*  CREATININE 1.36* 1.40* 1.39* 0.82 1.51*  CALCIUM 9.9 9.4  --  8.3* 9.4  MG 1.7  --   --   --   --    GFR: Estimated Creatinine Clearance: 39.5 mL/min (A) (by C-G formula based on SCr of 1.51 mg/dL (H)). Liver Function Tests: No results for input(s): AST, ALT, ALKPHOS, BILITOT, PROT, ALBUMIN in the last 168 hours. No results for input(s): LIPASE, AMYLASE in the last 168 hours. No results for input(s): AMMONIA in the last 168 hours. Coagulation Profile: No results for input(s): INR, PROTIME in the last 168 hours. Cardiac Enzymes: No results for input(s): CKTOTAL, CKMB, CKMBINDEX, TROPONINI in the last 168 hours. BNP (last 3 results) No results for input(s): PROBNP in the last 8760 hours. HbA1C: No  results for input(s): HGBA1C in the last 72 hours. CBG: Recent Labs  Lab 12/13/17 1634 12/13/17 1949 12/14/17 0111 12/14/17 0434 12/14/17 0725  GLUCAP 225* 223* 138* 133* 113*   Lipid Profile: No results for input(s): CHOL, HDL, LDLCALC, TRIG, CHOLHDL, LDLDIRECT in the last 72 hours. Thyroid Function Tests: No results for input(s): TSH, T4TOTAL, FREET4, T3FREE, THYROIDAB in the last 72 hours. Anemia Panel: No results for input(s): VITAMINB12, FOLATE, FERRITIN, TIBC, IRON, RETICCTPCT in the last 72 hours.    Radiology Studies: I have reviewed all of the imaging during this hospital visit personally     Scheduled Meds: . aspirin  81 mg Oral Daily  . atenolol  50 mg Oral Daily  . budesonide (PULMICORT) nebulizer solution  0.5 mg Nebulization BID  . chlorhexidine  15 mL Mouth Rinse BID  . DULoxetine  60 mg Oral Daily  . guaiFENesin  1,200 mg Oral BID  . heparin  5,000 Units Subcutaneous Q8H  . insulin aspart  0-20 Units Subcutaneous Q4H  . ipratropium-albuterol  3 mL Nebulization TID  .  levothyroxine  175 mcg Oral QAC breakfast  . mouth rinse  15 mL Mouth Rinse q12n4p  . pantoprazole  40 mg Oral Q0600  . potassium chloride  40 mEq Oral BID  . pravastatin  40 mg Oral Daily   Continuous Infusions:   LOS: 2 days        Mauricio Gerome Apley, MD [facility] Pager [phone]

## 2017-12-14 NOTE — Progress Notes (Signed)
D/w DR Pryor Montes MD - PCCM can skip 12/14/2017 and see again 12/15/17. If any issues will call u s   Dr. Brand Males, M.D., [facility].C.P Pulmonary and Critical Care Medicine Staff Physician, [place] Director - Interstitial Lung Disease  Program  Pulmonary [place] at [place], [place], [address]  Pager: [phone], If no answer or between  15:00h - 7:00h: call [phone] Telephone: [phone]

## 2017-12-14 NOTE — Progress Notes (Signed)
CRITICAL VALUE ALERT  Critical Value:  k-2.7  Date & Time Notied: 12/14/17 Provider Notified:  Kennon Holter  Orders Received/Actions taken:potassium replacement

## 2017-12-14 NOTE — Plan of Care (Signed)
Pt continue to progress 

## 2017-12-15 DIAGNOSIS — E876 Hypokalemia: Secondary | ICD-10-CM

## 2017-12-15 LAB — GLUCOSE, CAPILLARY
GLUCOSE-CAPILLARY: 104 mg/dL — AB (ref 65–99)
GLUCOSE-CAPILLARY: 114 mg/dL — AB (ref 65–99)
Glucose-Capillary: 127 mg/dL — ABNORMAL HIGH (ref 65–99)
Glucose-Capillary: 135 mg/dL — ABNORMAL HIGH (ref 65–99)
Glucose-Capillary: 144 mg/dL — ABNORMAL HIGH (ref 65–99)
Glucose-Capillary: 96 mg/dL (ref 65–99)
Glucose-Capillary: 96 mg/dL (ref 65–99)

## 2017-12-15 LAB — BASIC METABOLIC PANEL
Anion gap: 15 (ref 5–15)
BUN: 45 mg/dL — ABNORMAL HIGH (ref 6–20)
CALCIUM: 9.4 mg/dL (ref 8.9–10.3)
CO2: 40 mmol/L — ABNORMAL HIGH (ref 22–32)
CREATININE: 1.48 mg/dL — AB (ref 0.44–1.00)
Chloride: 86 mmol/L — ABNORMAL LOW (ref 101–111)
GFR calc non Af Amer: 34 mL/min — ABNORMAL LOW (ref >60)
GFR, EST AFRICAN AMERICAN: 40 mL/min — AB (ref >60)
Glucose, Bld: 105 mg/dL — ABNORMAL HIGH (ref 65–99)
Potassium: 3 mmol/L — ABNORMAL LOW (ref 3.5–5.1)
SODIUM: 141 mmol/L (ref 135–145)

## 2017-12-15 MED ORDER — POTASSIUM CHLORIDE 20 MEQ PO PACK
40.0000 meq | PACK | Freq: Every day | ORAL | Status: DC
  Administered 2017-12-15: 40 meq via ORAL
  Filled 2017-12-15 (×2): qty 2

## 2017-12-15 MED ORDER — ACETAMINOPHEN 325 MG PO TABS
650.0000 mg | ORAL_TABLET | Freq: Four times a day (QID) | ORAL | Status: DC | PRN
  Administered 2017-12-15 – 2017-12-16 (×2): 650 mg via ORAL
  Filled 2017-12-15 (×2): qty 2

## 2017-12-15 NOTE — Progress Notes (Signed)
Name: AGAPE HARDIMAN MRN: [id number] DOB: June 17, 1946    ADMISSION DATE:  12/12/2017 CONSULTATION DATE:  12/12/17  REFERRING MD :  Aggie Moats  CHIEF COMPLAINT:  SOB   HISTORY OF PRESENT ILLNESS:  Jillian Wood is a 72 y.o. female with a PMH as outlined below including COPD / obstructive lung disease, restrictive lung disease, chronic hypoxia and hypercapnia (was on 5L but recently has had to increase to 8L O2 as well as nocturnal BiPAP) for which she has been seen by Dr. Halford Chessman (last seen April 2018 and scheduled to see again January 07, 2018).  PFT's from April 2018 showed severe obstruction and restriction (FVC 37%, FEV1 37%).    She sees her PCP weekly for basic lab work given that she is on lasix and diamox daily.  She went for routine checkup on 3/28 and had lab work as usual.  On 3/29, she received call from PCP telling her that she had hypercapnia, hypokalemia and AKI; therefore, she needed to come to ED for further evaluation.  She also had hypoxia during her visit with SpO2 of 81% despite 10L O2 via .  Recently, she has had weight gain and increased O2 requirements, now 8L O2 which is up from 5L previously.  She also uses BiPAP nocturnally.  She has hax lasix increased from 80mg  daily to 160mg  daily and had zaroxyln added for a few days.  In ED 3/29, she was found to have acute on chronic hypoxemic and hypercapnic respiratory failure (ABG 7.46 / 68 / 51), hypokalemia (2.9), AKI (1.4).  CXR showed chronic interstitial changes as well as some interstitial edema. She denies any recent fevers/chills/sweats, chest pain, dyspnea, N/V/D, abd pain, myalgias, exposures to known sick contacts, recent travel.  She does endorse increase in LE edema and orthopnea.  States she has been taking her meds as prescribed and has not missed any doses.  Son confirms this to be true as he lives with her and cares for her daily.  PCP has apparently suggested that family meet with palliative care; however, pt is not  ready for this therefore they have not pursued it.  She was admitted by [facility] and PCCM was asked to see in consultation.    SUBJECTIVE:  No distress   VITAL SIGNS: Temp:  [96.5 F (35.8 C)-97.8 F (36.6 C)] 97.8 F (36.6 C) (04/01 1215) Pulse Rate:  [75-83] 77 (04/01 1215) Resp:  [15-22] 22 (04/01 1215) BP: (98-125)/(49-77) 98/56 (04/01 1215) SpO2:  [89 %-100 %] 89 % (04/01 1452) Weight:  [236 lb 15.9 oz (107.5 kg)] 236 lb 15.9 oz (107.5 kg) (04/01 0500) 8 liters  PHYSICAL EXAMINATION:  General -72 yo chronically ill appearing female resting in bed. No acute distress HENT -NCAT MMM no JVD pulm -decreased in bases no accessory use  Card- rrr no MRG Ext - brisk CR no sig edema currently  abd- obese + bowel sounds no OM  Neuro-hard of hearing awake and oriented. No focal def    Recent Labs  Lab 12/13/17 0311 12/14/17 0231 12/15/17 0253  NA 138 139 141  K 3.5 2.7* 3.0*  CL 102 83* 86*  CO2 26 40* 40*  BUN <5* 39* 45*  CREATININE 0.82 1.51* 1.48*  GLUCOSE 89 137* 105*   Recent Labs  Lab 12/12/17 1407 12/12/17 1827 12/13/17 0311  HGB 11.5* 11.2* 11.0*  HCT 40.9 41.2 39.8  WBC 6.3 5.7 5.6  PLT 256 277 247   No results found.  STUDIES:  CXR 3/29 > stable interstitial changes, some interstitial edema.  SIGNIFICANT EVENTS  3/29 > admit.  ASSESSMENT / PLAN:  Acute on chronic hypoxemic and hypercapnic respiratory failure - wears 8L O2 via [place] daily as well as nocturnal BiPAP (and during naps).  Likely exacerbated by volume overload / CHF exacerbation. COPD with possible acute exacerbation. R/o viral process.-RVP neg  Plan: Titrate oxygen for sats > 88%-->assess prior to dc Cont BIPAP at HS and PRN at home  Cont lasix Steroid taper  Cont BDs Agree likely ready for home soon 4/8 at 11am w/ Eric Form & Has follow up with Dr. Halford Chessman already scheduled for 01/07/18.  We will s/o  Erick Colace ACNP-BC [place] Pager # [phone] OR #  [phone] if no answer

## 2017-12-15 NOTE — Progress Notes (Signed)
PROGRESS NOTE    Jillian Wood  QQV:[id number] DOB: 02-13-1946 DOA: 12/12/2017 PCP: Unk Pinto, MD    Brief Narrative:  72 yo female who presented with worsening hypoxemia. She does have a significant past medical history for congestive heart failure, COPD, hypertension, morbid obesity and chronic hypoxic respiratory failure, on supplemental oxygen 8 L/m. Patient was found to have severe hypoxemia, transferred to the hospital for further evaluation. On the initial physical examination blood pressure 105/72, heart rate 80, respiratory rate 25, oxygen saturation 85-88%, moist mucous membranes, heart S1-S2 present, rhythmic, no gallops, rubs or murmurs, lungs with decreased breath sounds, abdomen protuberant, no lower extremity edema. Sodium 140, potassium 2.9, chloride 82, bicarbonate 41, glucose 110, BUN 33, creatinine 1.40, BNP 978, white count 6.3, hemoglobin 11.5, hematocrit 40.9, platelets 256, arterial blood gas 7.46, 68.6, 51.0 49.1, 86%. Viral respiratory panel negative.Chest film with bilateral interstitial markings and small bilateral pleural effusion.EKG atrial flutter rhythm, 79 bpm,right axis deviation, right bundle branch block, lateral T-wave inversions.  Patient was admitted to the hospital with the working diagnosis of acute on chronic hypoxic respiratory failure.  Assessment & Plan:   Active Problems:   Respiratory distress   Acute respiratory failure (HCC)   Pressure injury of skin   Chronic respiratory failure with hypercapnia (HCC)  1. Acute on chronic hypoxic respiratory failure.  Patient very close to her baseline, very poor functional status, will continue oxymetry monitoring and supplemental 02 per . AT home on 8 lpm of supplemental 02.   2. Congestive heart failure. Will resume diuresis in am, will continue heart failure management with atenolol, holding on ace inh to prevent hypotension or worsening renal function.   3. AKI with hypokalemia. Improved  renal function, with serum cr at 1,48, with K at 3,0 and sserum bicarbonate at 40, will order kcl 40 meq once and follow on renal panel in am.  3. COPD with chronic hypoxic respiratory failure and pulmonary HTN. On bronchodilator therapy with duoneb and budesonide.Poor functional status NYH class IV.  4. Atrial flutter. Continue rate control with atenolol. Currently not on anticoagulation due to hx of GI bleeding.  5. Morbid obesitywith OSA. Tolerating well C pap. Will need close follow up as outpatient.    6. T2DM.  Continue glucose cover and monitoring with insulin sliding scale. Capillary glucose 114, 104, 144, 127, 96.   7. Hypothyroid. On levothyroxine.   DVT prophylaxis:enoxaparin Code Status:full Family Communication:no family at the bedside Disposition Plan:home in am if continue to improve renal function   Consultants:    Procedures:    Antimicrobials:   Subjective: Patient is clinically feeling better, close to her baseline, no nausea or vomiting, no chest pain or abdominal pain.   Objective: Vitals:   12/15/17 0939 12/15/17 0942 12/15/17 1215 12/15/17 1452  BP:   (!) 98/56   Pulse:   77   Resp:   (!) 22   Temp:   97.8 F (36.6 C)   TempSrc:   Oral   SpO2: 93% 94% 96% (!) 89%  Weight:      Height:        Intake/Output Summary (Last 24 hours) at 12/15/2017 1538 Last data filed at 12/15/2017 1230 Gross per 24 hour  Intake 920 ml  Output 550 ml  Net 370 ml   Filed Weights   12/13/17 0327 12/14/17 0500 12/15/17 0500  Weight: 114.8 kg (253 lb 1.4 oz) 107.9 kg (237 lb 14 oz) 107.5 kg (236 lb 15.9 oz)  Examination:   General: Not in pain or dyspnea, deconditioned Neurology: Awake and alert, non focal  E ENT: no pallor, no icterus, oral mucosa moist Cardiovascular: No JVD. S1-S2 present, rhythmic, no gallops, rubs, or murmurs. ++ pitting bilateral lower extremity edema. Pulmonary: decreased breath sounds bilaterally, adequate air  movement, no wheezing, rhonchi, scattered rales bilateral difusse. Gastrointestinal. Abdomen protuberant with no organomegaly, non tender, no rebound or guarding Skin. No rashes Musculoskeletal: no joint deformities     Data Reviewed: I have personally reviewed following labs and imaging studies  CBC: Recent Labs  Lab 12/11/17 1633 12/12/17 1407 12/12/17 1827 12/13/17 0311  WBC 6.7 6.3 5.7 5.6  NEUTROABS 5,327 4.9  --  3.9  HGB 12.0 11.5* 11.2* 11.0*  HCT 41.1 40.9 41.2 39.8  MCV 80.4 85.2 85.3 84.9  PLT 284 256 277 295   Basic Metabolic Panel: Recent Labs  Lab 12/11/17 1633 12/12/17 1407 12/12/17 1827 12/13/17 0311 12/14/17 0231 12/15/17 0253  NA 142 140  --  138 139 141  K 3.4* 2.9*  --  3.5 2.7* 3.0*  CL 86* 82*  --  102 83* 86*  CO2 >45* 41*  --  26 40* 40*  GLUCOSE 127* 110*  --  89 137* 105*  BUN 33* 33*  --  <5* 39* 45*  CREATININE 1.36* 1.40* 1.39* 0.82 1.51* 1.48*  CALCIUM 9.9 9.4  --  8.3* 9.4 9.4  MG 1.7  --   --   --   --   --    GFR: Estimated Creatinine Clearance: 40.2 mL/min (A) (by C-G formula based on SCr of 1.48 mg/dL (H)). Liver Function Tests: No results for input(s): AST, ALT, ALKPHOS, BILITOT, PROT, ALBUMIN in the last 168 hours. No results for input(s): LIPASE, AMYLASE in the last 168 hours. No results for input(s): AMMONIA in the last 168 hours. Coagulation Profile: No results for input(s): INR, PROTIME in the last 168 hours. Cardiac Enzymes: No results for input(s): CKTOTAL, CKMB, CKMBINDEX, TROPONINI in the last 168 hours. BNP (last 3 results) No results for input(s): PROBNP in the last 8760 hours. HbA1C: No results for input(s): HGBA1C in the last 72 hours. CBG: Recent Labs  Lab 12/14/17 2027 12/15/17 0053 12/15/17 0403 12/15/17 0751 12/15/17 1147  GLUCAP 145* 114* 104* 144* 127*   Lipid Profile: No results for input(s): CHOL, HDL, LDLCALC, TRIG, CHOLHDL, LDLDIRECT in the last 72 hours. Thyroid Function Tests: No results  for input(s): TSH, T4TOTAL, FREET4, T3FREE, THYROIDAB in the last 72 hours. Anemia Panel: No results for input(s): VITAMINB12, FOLATE, FERRITIN, TIBC, IRON, RETICCTPCT in the last 72 hours.    Radiology Studies: I have reviewed all of the imaging during this hospital visit personally     Scheduled Meds: . aspirin  81 mg Oral Daily  . atenolol  50 mg Oral Daily  . budesonide (PULMICORT) nebulizer solution  0.5 mg Nebulization BID  . chlorhexidine  15 mL Mouth Rinse BID  . DULoxetine  60 mg Oral Daily  . guaiFENesin  1,200 mg Oral BID  . heparin  5,000 Units Subcutaneous Q8H  . insulin aspart  0-20 Units Subcutaneous Q4H  . ipratropium-albuterol  3 mL Nebulization TID  . levothyroxine  175 mcg Oral QAC breakfast  . mouth rinse  15 mL Mouth Rinse q12n4p  . potassium chloride  40 mEq Oral Daily  . pravastatin  40 mg Oral Daily   Continuous Infusions:   LOS: 3 days  Mauricio Gerome Apley, MD [facility] Pager [phone]

## 2017-12-16 ENCOUNTER — Encounter: Payer: Self-pay | Admitting: Internal Medicine

## 2017-12-16 DIAGNOSIS — E6609 Other obesity due to excess calories: Secondary | ICD-10-CM

## 2017-12-16 DIAGNOSIS — Z9989 Dependence on other enabling machines and devices: Secondary | ICD-10-CM

## 2017-12-16 DIAGNOSIS — J449 Chronic obstructive pulmonary disease, unspecified: Secondary | ICD-10-CM

## 2017-12-16 DIAGNOSIS — G4733 Obstructive sleep apnea (adult) (pediatric): Secondary | ICD-10-CM

## 2017-12-16 LAB — BASIC METABOLIC PANEL
Anion gap: 16 — ABNORMAL HIGH (ref 5–15)
BUN: 45 mg/dL — AB (ref 6–20)
CO2: 37 mmol/L — ABNORMAL HIGH (ref 22–32)
CREATININE: 1.37 mg/dL — AB (ref 0.44–1.00)
Calcium: 9.6 mg/dL (ref 8.9–10.3)
Chloride: 88 mmol/L — ABNORMAL LOW (ref 101–111)
GFR calc Af Amer: 44 mL/min — ABNORMAL LOW (ref >60)
GFR, EST NON AFRICAN AMERICAN: 38 mL/min — AB (ref >60)
GLUCOSE: 118 mg/dL — AB (ref 65–99)
POTASSIUM: 3.8 mmol/L (ref 3.5–5.1)
SODIUM: 141 mmol/L (ref 135–145)

## 2017-12-16 LAB — GLUCOSE, CAPILLARY
GLUCOSE-CAPILLARY: 139 mg/dL — AB (ref 65–99)
Glucose-Capillary: 126 mg/dL — ABNORMAL HIGH (ref 65–99)
Glucose-Capillary: 128 mg/dL — ABNORMAL HIGH (ref 65–99)

## 2017-12-16 MED ORDER — TRAMADOL HCL 50 MG PO TABS
50.0000 mg | ORAL_TABLET | Freq: Once | ORAL | Status: AC
  Administered 2017-12-16: 50 mg via ORAL
  Filled 2017-12-16: qty 1

## 2017-12-16 NOTE — Care Management Note (Signed)
Case Management Note  Patient Details  Name: Jillian Wood MRN: [id number] Date of Birth: 1946-02-02  Subjective/Objective:                 Spoke with patient and son at the bedside. They are agreeable to [facility] services. Referral accepted by [facility]. No further CM needs.    Action/Plan:   Expected Discharge Date:  12/16/17               Expected Discharge Plan:  [place]  In-House Referral:     Discharge planning Services  CM Consult  Post Acute Care Choice:  Home Health Choice offered to:  Patient  DME Arranged:    DME Agency:     HH Arranged:  RN, PT HH Agency:  [facility]  Status of Service:  Completed, signed off  If discussed at [place] of Stay Meetings, dates discussed:    Additional Comments:  Jillian Collet, RN 12/16/2017, 1:20 PM

## 2017-12-16 NOTE — Progress Notes (Signed)
Patient's son Roselyn Reef) called to get an update on his mother. He was concerned about his mother's mental status.   Patient is frequently saying " Help me" but when asked what she needs help with she says that its just something she says.  Patients son requested that this be noted so that rounding team can address this concern in the morning.

## 2017-12-16 NOTE — Progress Notes (Signed)
I reviewed Jillian Wood's charting after assessing the patient myself, and agree with her findings

## 2017-12-16 NOTE — Care Management Important Message (Signed)
Important Message  Patient Details  Name: Jillian Wood MRN: [id number] Date of Birth: 07-15-1946   Medicare Important Message Given:  Yes    Orbie Pyo 12/16/2017, 2:38 PM

## 2017-12-16 NOTE — Progress Notes (Signed)
Patient discharged via her own wheelchair on her own home oxygen accompanied by her son. Discharge paperwork discussed with patient. Questions answered.  All of patient's personal belongings sent with her.

## 2017-12-16 NOTE — Progress Notes (Addendum)
Patient has become increasingly restless, complains of pain in her back and right shoulder unrelieved by PRN tylenol. She is refusing to be repositioned onto her back or left side at this  time. This RN educated patient that if  continues to lay on her right side it will not help to alleviate pain in her shoulder.  Patient was given pain mediation and moved up in the bed.  Will continue to monitor

## 2017-12-16 NOTE — Discharge Summary (Signed)
BPhysician Discharge Summary  Jillian Wood BSJ:[id number] DOB: [birth year] DOA: 12/12/2017  PCP: Unk Pinto, MD  Admit date: 12/12/2017 Discharge date: 12/16/2017  Admitted From: Home Disposition:  Home  Recommendations for Outpatient Follow-up and new medication changes:  1. Follow up with PCP in 1- weeks 2. Patient will resume home diuretic regimen  Home Health: no   Equipment/Devices: walker, home 02 (8LPM)   Discharge Condition: home CODE STATUS: full  Diet recommendation: Heart healthy and diabetic prudent  Brief/Interim Summary: 72 yo female who presented with worsening hypoxemia. She does have a significant past medical history for congestive heart failure, COPD, hypertension, morbid obesity and chronic hypoxic respiratory failure, on supplemental oxygen 8 L/m. Patient was found to have severe hypoxemia, transferred to the hospital for further evaluation. On the initial physical examination blood pressure 105/72, heart rate 80, respiratory rate 25, oxygen saturation 85-88%, moist mucous membranes, heart S1-S2 present, rhythmic, no gallops, rubs or murmurs, lungs with decreased breath sounds, abdomen protuberant, postive lower extremity edema. Sodium 140, potassium 2.9, chloride 82, bicarbonate 41, glucose 110, BUN 33, creatinine 1.40, BNP 978, white count 6.3, hemoglobin 11.5, hematocrit 40.9, platelets 256, arterial blood gas 7.46, 68.6, 51.0 49.1, 86%. Viral respiratory panel negative.Chest film with bilateral interstitial markings and small bilateral pleural effusion.EKG atrial flutter rhythm, 79 bpm, right axis deviation, right bundle branch block, lateral T-wave inversions.  Patient was admitted to the hospital with the working diagnosis of acute on chronic hypoxic respiratory failure.  1. Acute on chronic hypoxic respiratory failure due to cardiogenic pulmonary edema, acute on chronic diastolic heart failure decompensation (present on admission). Patient was admitted to  the medical ward, she was placed on a telemetry monitor, continuous oximetry monitoring and supplemental oxygen per high flow nasal cannula. Patient was diuresed with IV furosemide, negative fluid balance was achieved -4,123 ml since admission, with significant improvement of her symptoms. Patient will continue heart failure regimen with atenolol, diltiazem, acetazolamide, furosemide, and metolazone. Oxygen saturation at discharge 96 to 97% on supplemental oxygen 8 L/m.   2. COPD with chronic hypoxic respiratory failure, pulmonary hypertension/ districtive lung disease due to obesity. Patient received bronchodilator therapy, oxygen supplementation, no clinical signs of exacerbation. Patient will continue bronchodilator therapy with albuterol, ipratropium, and inhaled corticosteroids with budesonide. Patient is on 8 L/m of supplemental oxygen per nasal cannula at home. She does have a significant limitation in her physical functional capacity, [place] Heart Association class III to IV.   3. Acute kidney injury with hypokalemia. Patient was diuresed with IV furosemide, patient showed features of overdiuresis with worsening renal function, peak creatinine 1.51, nephrotoxic agents and diuretics were held, discharge creatinine 1.37, potassium 3.8, serum bicarbonate 37. Patient will resume diuretic therapy at discharge.  4. Morbid obesity with obstructive sleep apnea. Patient very well CPAP at night. Improved symptoms with diuresis.  5. Atrial flutter. It remained rate control with atenolol and diltiazem. Continue aspirin for anticoagulation, patient had history of GI bleeding the past.  6. Type 2 diabetes mellitus. She was placed on insulin sliding scale for glucose coverage and monitoring, capillary glucose remained well-controlled. Patient will resume metformin discharge.  7. Hypothyroidism. Continue levothyroxine.   8. Anxiety/depression. Continue diazepam as needed twice daily, and 60 mg of duloxetine daily.  Close follow-up as an outpatient, consider to limit  benzodiazepines considering her decreased respiratory reserve.     Discharge Diagnoses:  Active Problems:   Respiratory distress   Acute respiratory failure (HCC)   Pressure injury of skin  Chronic respiratory failure with hypercapnia (HCC)   Hypokalemia    Discharge Instructions   Allergies as of 12/16/2017      Reactions   Inderal [propranolol] Other (See Comments)   Hair loss   Ace Inhibitors Cough      Medication List    STOP taking these medications   TYLENOL 8 HOUR ARTHRITIS PAIN PO     TAKE these medications   acetaminophen-codeine 300-30 MG tablet Commonly known as:  TYLENOL #3 Take 1 tablet by mouth every 8 (eight) hours as needed for moderate pain or severe pain.   acetaZOLAMIDE 250 MG tablet Commonly known as:  DIAMOX TAKE 1 TABLET (250 MG TOTAL) BY MOUTH 3 (THREE) TIMES DAILY.   albuterol 108 (90 Base) MCG/ACT inhaler Commonly known as:  PROVENTIL HFA;VENTOLIN HFA Inhale 1-2 puffs into the lungs every 6 (six) hours as needed for wheezing or shortness of breath.   albuterol (2.5 MG/3ML) 0.083% nebulizer solution Commonly known as:  PROVENTIL Take 3 mLs (2.5 mg total) by nebulization every 6 (six) hours as needed for wheezing or shortness of breath.   aspirin 81 MG tablet Take 81 mg by mouth daily.   atenolol 50 MG tablet Commonly known as:  TENORMIN Take 1 tablet (50 mg total) by mouth daily.   budesonide 0.5 MG/2ML nebulizer solution Commonly known as:  PULMICORT Take 2 mLs (0.5 mg total) by nebulization 2 (two) times daily.   diazepam 5 MG tablet Commonly known as:  VALIUM Take one tablet up to 2 x a daily for anxiety AS NEEDED   diltiazem 300 MG 24 hr capsule Commonly known as:  CARDIZEM CD TAKE 1 CAPSULE (300 MG TOTAL) BY MOUTH DAILY.   DULoxetine 60 MG capsule Commonly known as:  CYMBALTA TAKE 1 CAPSULE BY MOUTH DAILY What changed:    how much to take  how to take this  when  to take this   ferrous sulfate 325 (65 FE) MG tablet Take 325 mg by mouth 2 (two) times daily with a meal.   fluticasone 50 MCG/ACT nasal spray Commonly known as:  FLONASE Place 2 sprays into both nostrils daily. What changed:    when to take this  reasons to take this   furosemide 80 MG tablet Commonly known as:  LASIX Take 1 tablet (80 mg total) by mouth daily.   guaiFENesin 600 MG 12 hr tablet Commonly known as:  MUCINEX Take 600 mg by mouth 2 (two) times daily.   ipratropium 0.02 % nebulizer solution Commonly known as:  ATROVENT Take 2.5 mLs (0.5 mg total) by nebulization every 6 (six) hours.   levothyroxine 175 MCG tablet Commonly known as:  SYNTHROID, LEVOTHROID TAKE 1 TABLET (175 MCG TOTAL) BY MOUTH DAILY BEFORE BREAKFAST.   loratadine-pseudoephedrine 10-240 MG 24 hr tablet Commonly known as:  CLARITIN-D 24-hour Take 1 tablet by mouth as needed for allergies.   MAGNESIUM DR PO Take 1 capsule by mouth daily.   metFORMIN 500 MG 24 hr tablet Commonly known as:  GLUCOPHAGE-XR TAKE 1 TABLET (500 MG TOTAL) BY MOUTH 4 (FOUR) TIMES DAILY - AFTER MEALS AND AT BEDTIME.   metolazone 5 MG tablet Commonly known as:  ZAROXOLYN Take 1 pill 1 hour before the lasix every day, call the office monday What changed:    how much to take  how to take this  when to take this  additional instructions   OXYGEN Inhale 8 L into the lungs continuous.   pantoprazole 40 MG tablet  Commonly known as:  PROTONIX Take 1 tablet (40 mg total) by mouth daily at 6 (six) AM.   polyethylene glycol packet Commonly known as:  MIRALAX / GLYCOLAX Take 17 g by mouth 2 (two) times daily.   pravastatin 40 MG tablet Commonly known as:  PRAVACHOL TAKE 1 TABLET BY MOUTH EVERY DAY What changed:    how much to take  how to take this  when to take this   VITAMIN D PO Take 2,000 Units by mouth daily.      Follow-up Information    Magdalen Spatz, NP Follow up on 12/22/2017.    Specialty:  Pulmonary Disease Why:  at 11 am  Contact information: [address] [place] [address] [phone]          Allergies  Allergen Reactions  . Inderal [Propranolol] Other (See Comments)    Hair loss  . Ace Inhibitors Cough    Consultations:  Pulmonary    Procedures/Studies: Dg Chest Port 1 View  Result Date: 12/13/2017 CLINICAL DATA:  Respiratory failure EXAM: PORTABLE CHEST 1 VIEW COMPARISON:  Yesterday FINDINGS: Low lung volumes with diffuse interstitial coarsening. Asymmetric right more than left lower lung opacity, worsened on the right. Cardiomegaly. Mediastinal contours are distorted by marked rightward rotation. IMPRESSION: Low volume chest with increased atelectasis or pneumonia at the right more than left base. Increased interstitial coarsening suggesting edema. Electronically Signed   By: Monte Fantasia M.D.   On: 12/13/2017 08:32   Dg Chest Port 1 View  Result Date: 12/12/2017 CLINICAL DATA:  Shortness of breath, fluid retention. History of CHF, COPD, diabetes, hypertension. Former smoker. EXAM: PORTABLE CHEST 1 VIEW COMPARISON:  Chest x-rays dated 08/15/2017, 11/11/2016 and 07/31/2016. FINDINGS: Stable cardiomegaly. Aortic atherosclerosis. Diffuse interstitial prominence is stable. Additional patchy opacities at the lung bases are not significantly changed, presumed chronic atelectasis and/or pleural thickening. No pleural effusion or pneumothorax seen. IMPRESSION: 1. Stable interstitial prominence, similar to multiple previous chest x-rays suggesting chronic interstitial lung disease. No evidence of active CHF/pulmonary edema. 2. Stable bibasilar opacities, most likely chronic atelectasis and/or pleural thickening. No evidence of pneumonia or pleural effusion. 3. Stable cardiomegaly. 4. Aortic atherosclerosis. Electronically Signed   By: Franki Cabot M.D.   On: 12/12/2017 14:20       Subjective: Patient is feeling better, dyspnea is  back to her baseline, no wheezing, no chest pain, no nausea or vomiting.   Discharge Exam: Vitals:   12/16/17 0806 12/16/17 0807  BP:    Pulse:    Resp:    Temp:    SpO2: 96% 97%   Vitals:   12/16/17 0730 12/16/17 0800 12/16/17 0806 12/16/17 0807  BP: 120/88 120/88    Pulse: (!) 18 83    Resp: 18 20    Temp: 98.3 F (36.8 C) (!) 97.4 F (36.3 C)    TempSrc: Oral Axillary    SpO2: 97% 97% 96% 97%  Weight:      Height:        General: Not in pain or dyspnea.  Neurology: Awake and alert, non focal  E ENT: mild pallor, no icterus, oral mucosa moist Cardiovascular: No JVD. S1-S2 present, rhythmic, no gallops, rubs, or murmurs.+to ++ pitting bilateral lower extremity edema. Pulmonary: decreased breath sounds bilaterally, decreased air movement, no wheezing, rhonchi, sdcattered rales. Gastrointestinal. Abdomen protuberant no organomegaly, non tender, no rebound or guarding Skin. No rashes Musculoskeletal: no joint deformities   The results of significant diagnostics from this  hospitalization (including imaging, microbiology, ancillary and laboratory) are listed below for reference.     Microbiology: Recent Results (from the past 240 hour(s))  Respiratory Panel by PCR     Status: None   Collection Time: 12/13/17  1:21 AM  Result Value Ref Range Status   Adenovirus NOT DETECTED NOT DETECTED Final   Coronavirus 229E NOT DETECTED NOT DETECTED Final   Coronavirus HKU1 NOT DETECTED NOT DETECTED Final   Coronavirus NL63 NOT DETECTED NOT DETECTED Final   Coronavirus OC43 NOT DETECTED NOT DETECTED Final   Metapneumovirus NOT DETECTED NOT DETECTED Final   Rhinovirus / Enterovirus NOT DETECTED NOT DETECTED Final   Influenza A NOT DETECTED NOT DETECTED Final   Influenza B NOT DETECTED NOT DETECTED Final   Parainfluenza Virus 1 NOT DETECTED NOT DETECTED Final   Parainfluenza Virus 2 NOT DETECTED NOT DETECTED Final   Parainfluenza Virus 3 NOT DETECTED NOT DETECTED Final    Parainfluenza Virus 4 NOT DETECTED NOT DETECTED Final   Respiratory Syncytial Virus NOT DETECTED NOT DETECTED Final   Bordetella pertussis NOT DETECTED NOT DETECTED Final   Chlamydophila pneumoniae NOT DETECTED NOT DETECTED Final   Mycoplasma pneumoniae NOT DETECTED NOT DETECTED Final    Comment: Performed at [facility], [address] [place], [place] [address]  MRSA PCR Screening     Status: None   Collection Time: 12/13/17  1:21 AM  Result Value Ref Range Status   MRSA by PCR NEGATIVE NEGATIVE Final    Comment:        The GeneXpert MRSA Assay (FDA approved for NASAL specimens only), is one component of a comprehensive MRSA colonization surveillance program. It is not intended to diagnose MRSA infection nor to guide or monitor treatment for MRSA infections. Performed at [facility], [place] [address], [place], [place] [address]      Labs: BNP (last 3 results) Recent Labs    08/17/17 0257 11/10/17 1620 12/12/17 1407  BNP 610.4* 727* 657.8*   Basic Metabolic Panel: Recent Labs  Lab 12/11/17 1633  12/12/17 1407 12/12/17 1827 12/13/17 0311 12/14/17 0231 12/15/17 0253 12/16/17 0230  NA 142  --  140  --  138 139 141 141  K 3.4*  --  2.9*  --  3.5 2.7* 3.0* 3.8  CL 86*  --  82*  --  102 83* 86* 88*  CO2 >45*  --  41*  --  26 40* 40* 37*  GLUCOSE 127*  --  110*  --  89 137* 105* 118*  BUN 33*  --  33*  --  <5* 39* 45* 45*  CREATININE 1.36*   < > 1.40* 1.39* 0.82 1.51* 1.48* 1.37*  CALCIUM 9.9  --  9.4  --  8.3* 9.4 9.4 9.6  MG 1.7  --   --   --   --   --   --   --    < > = values in this interval not displayed.   Liver Function Tests: No results for input(s): AST, ALT, ALKPHOS, BILITOT, PROT, ALBUMIN in the last 168 hours. No results for input(s): LIPASE, AMYLASE in the last 168 hours. No results for input(s): AMMONIA in the last 168 hours. CBC: Recent Labs  Lab 12/11/17 1633 12/12/17 1407 12/12/17 1827 12/13/17 0311  WBC 6.7 6.3 5.7 5.6   NEUTROABS 5,327 4.9  --  3.9  HGB 12.0 11.5* 11.2* 11.0*  HCT 41.1 40.9 41.2 39.8  MCV 80.4 85.2 85.3 84.9  PLT 284 256 277 247   Cardiac Enzymes: No results for input(s): CKTOTAL, CKMB, CKMBINDEX, TROPONINI in the last 168 hours. BNP: Invalid input(s): POCBNP CBG: Recent Labs  Lab 12/15/17 1655 12/15/17 2015 12/15/17 2345 12/16/17 0402 12/16/17 0757  GLUCAP 96 135* 96 126* 139*   D-Dimer No results for input(s): DDIMER in the last 72 hours. Hgb A1c No results for input(s): HGBA1C in the last 72 hours. Lipid Profile No results for input(s): CHOL, HDL, LDLCALC, TRIG, CHOLHDL, LDLDIRECT in the last 72 hours. Thyroid function studies No results for input(s): TSH, T4TOTAL, T3FREE, THYROIDAB in the last 72 hours.  Invalid input(s): FREET3 Anemia work up No results for input(s): VITAMINB12, FOLATE, FERRITIN, TIBC, IRON, RETICCTPCT in the last 72 hours. Urinalysis    Component Value Date/Time   COLORURINE YELLOW 07/27/2015 1612   APPEARANCEUR CLEAR 07/27/2015 1612   LABSPEC 1.017 07/27/2015 1612   PHURINE 6.0 07/27/2015 1612   GLUCOSEU NEGATIVE 07/27/2015 1612   HGBUR 2+ (A) 07/27/2015 1612   BILIRUBINUR NEGATIVE 07/27/2015 1612   KETONESUR NEGATIVE 07/27/2015 1612   PROTEINUR NEGATIVE 07/27/2015 1612   UROBILINOGEN 1.0 10/23/2009 1145   NITRITE NEGATIVE 07/27/2015 1612   LEUKOCYTESUR NEGATIVE 07/27/2015 1612   Sepsis Labs Invalid input(s): PROCALCITONIN,  WBC,  LACTICIDVEN Microbiology Recent Results (from the past 240 hour(s))  Respiratory Panel by PCR     Status: None   Collection Time: 12/13/17  1:21 AM  Result Value Ref Range Status   Adenovirus NOT DETECTED NOT DETECTED Final   Coronavirus 229E NOT DETECTED NOT DETECTED Final   Coronavirus HKU1 NOT DETECTED NOT DETECTED Final   Coronavirus NL63 NOT DETECTED NOT DETECTED Final   Coronavirus OC43 NOT DETECTED NOT DETECTED Final   Metapneumovirus NOT DETECTED NOT DETECTED Final   Rhinovirus / Enterovirus NOT  DETECTED NOT DETECTED Final   Influenza A NOT DETECTED NOT DETECTED Final   Influenza B NOT DETECTED NOT DETECTED Final   Parainfluenza Virus 1 NOT DETECTED NOT DETECTED Final   Parainfluenza Virus 2 NOT DETECTED NOT DETECTED Final   Parainfluenza Virus 3 NOT DETECTED NOT DETECTED Final   Parainfluenza Virus 4 NOT DETECTED NOT DETECTED Final   Respiratory Syncytial Virus NOT DETECTED NOT DETECTED Final   Bordetella pertussis NOT DETECTED NOT DETECTED Final   Chlamydophila pneumoniae NOT DETECTED NOT DETECTED Final   Mycoplasma pneumoniae NOT DETECTED NOT DETECTED Final    Comment: Performed at [facility], [place] [address], [place], [place] [address]  MRSA PCR Screening     Status: None   Collection Time: 12/13/17  1:21 AM  Result Value Ref Range Status   MRSA by PCR NEGATIVE NEGATIVE Final    Comment:        The GeneXpert MRSA Assay (FDA approved for NASAL specimens only), is one component of a comprehensive MRSA colonization surveillance program. It is not intended to diagnose MRSA infection nor to guide or monitor treatment for MRSA infections. Performed at [facility], [place] [address] [place], [place] [address]      Time coordinating discharge: 45 minutes  SIGNED:   Tawni Millers, MD  [facility] 12/16/2017, 9:02 AM Pager [phone]  If 7PM-7AM, please contact night-coverage [email or web address] Password [email or web address]

## 2017-12-17 DIAGNOSIS — J449 Chronic obstructive pulmonary disease, unspecified: Secondary | ICD-10-CM | POA: Diagnosis not present

## 2017-12-22 ENCOUNTER — Ambulatory Visit (INDEPENDENT_AMBULATORY_CARE_PROVIDER_SITE_OTHER): Payer: PPO | Admitting: Orthopedic Surgery

## 2017-12-22 ENCOUNTER — Inpatient Hospital Stay: Payer: PPO | Admitting: Acute Care

## 2017-12-23 ENCOUNTER — Telehealth: Payer: Self-pay | Admitting: *Deleted

## 2017-12-23 DIAGNOSIS — J449 Chronic obstructive pulmonary disease, unspecified: Secondary | ICD-10-CM | POA: Diagnosis not present

## 2017-12-23 NOTE — Progress Notes (Signed)
Hospital follow up  [place] and Plan: Hospital visit follow up for   Acute respiratory failure with hypoxia and hypercapnia (HCC) O2 at 90% on 8L Continue albuterol, will try to get pulmicort covered Follow up respiratory Continue the diamox  Respiratory distress Resolved Monitor weight and continue Bipap at night and naps  Hypokalemia -     BASIC METABOLIC PANEL WITH GFR  Chronic respiratory failure with hypercapnia (HCC) Continue albuterol, will try to get pulmicort covered Follow up respiratory Continue the diamox  Chronic diastolic congestive heart failure (HCC) New dry weight is 230 Given strict eating parameters Given fluid restrictions If dry weight is 5 lbs above 230, will add on zaroxyln x 1 day Weight daily  Essential hypertension - continue medications, DASH diet, exercise and monitor at home. Call if greater than 130/80.  -     CBC with Differential/Platelet -     BASIC METABOLIC PANEL WITH GFR -     Hepatic function panel  Paroxysmal SVT (supraventricular tachycardia) (HCC) Control blood pressure, cholesterol, glucose, increase exercise.   Atrial flutter with rapid ventricular response (HCC) Not on anticoagulation at this time due to GI bleed  Dilated cardiomyopathy (HCC) New dry weight is 230 Given strict eating parameters Given fluid restrictions If dry weight is 5 lbs above 230, will add on zaroxyln x 1 day Weight daily  Pulmonary hypertension (HCC) Continue Bipap  Aortic atherosclerosis (HCC) Control blood pressure, cholesterol, glucose, increase exercise.   Chronic obstructive pulmonary disease, unspecified COPD type ([place]) Will try to get Pulmicort on board, going to follow up pulmonary  OSA and COPD overlap syndrome ([place]) Emphasized Bipap  CKD (chronic kidney disease) stage 3, GFR 30-59 ml/min (HCC) Monitor closely  Morbid obesity ([place]) LONG discussion about weight loss,  diet and how it affects her breathing  Hospital discharge meds were reviewed, and reconciled with the patient.   There are no discontinued medications.  Over 40 minutes of exam, counseling, chart review, and complex, high level critical decision making was performed this visit.     HPI 72 y.o.female presents for follow up for transition from recent hospitalization. Admit date to the hospital was 12/12/17, patient was discharged from the hospital on 12/16/17 and our clinical staff contacted the office the day after discharge to set up a follow up appointment. The discharge summary, medications, and diagnostic test results were reviewed before meeting with the patient. The patient was admitted for:   Acute respiratory failure with hypoxia and hypercapnia (HCC) with respiratory distress, hypokalemia and acute on chronic diastolic heart failure.   Patient was sent to the ER due to severe hypercapnia and fluid over load. She was diuresed with IV lasix and symptoms improved. She is on albuterol, ipratropium, and budsonide and BiPAP. Her lasix was held in the hospital due to ARF, she was to resume lasix and CHF protocol outpatient.   She is on duoneb BID and has not started the pulmicort yet.   Has follow up with Dr. Marlou Sa on Monday for her right shoulder pain.   Today she is on 8L O2, she is on 90%, her weight is stable, down 10 lbs. She has been on lasix 80mg  daily and off zaroxyln.  Wt Readings from Last 3 Encounters:  12/24/17 234 lb 9.6 oz (106.4 kg)  12/16/17 234 lb 9.1 oz (106.4 kg)  12/11/17 244 lb (110.7 kg)   Lab Results  Component Value Date  HGBA1C 6.2 (H) 11/10/2017    Lab Results  Component Value Date   CREATININE 1.37 (H) 12/16/2017   BUN 45 (H) 12/16/2017   NA 141 12/16/2017   K 3.8 12/16/2017   CL 88 (L) 12/16/2017   CO2 37 (H) 12/16/2017   Home health is not involved.   Blood pressure 118/68, pulse (!) 109, temperature (!) 97.2 F (36.2 C), resp. rate 16, height 5'  2" (1.575 m), weight 234 lb 9.6 oz (106.4 kg), SpO2 90 %.  Images while in the hospital: Dg Chest Port 1 View  Result Date: 12/13/2017 CLINICAL DATA:  Respiratory failure EXAM: PORTABLE CHEST 1 VIEW COMPARISON:  Yesterday FINDINGS: Low lung volumes with diffuse interstitial coarsening. Asymmetric right more than left lower lung opacity, worsened on the right. Cardiomegaly. Mediastinal contours are distorted by marked rightward rotation. IMPRESSION: Low volume chest with increased atelectasis or pneumonia at the right more than left base. Increased interstitial coarsening suggesting edema. Electronically Signed   By: Monte Fantasia M.D.   On: 12/13/2017 08:32   Dg Chest Port 1 View  Result Date: 12/12/2017 CLINICAL DATA:  Shortness of breath, fluid retention. History of CHF, COPD, diabetes, hypertension. Former smoker. EXAM: PORTABLE CHEST 1 VIEW COMPARISON:  Chest x-rays dated 08/15/2017, 11/11/2016 and 07/31/2016. FINDINGS: Stable cardiomegaly. Aortic atherosclerosis. Diffuse interstitial prominence is stable. Additional patchy opacities at the lung bases are not significantly changed, presumed chronic atelectasis and/or pleural thickening. No pleural effusion or pneumothorax seen. IMPRESSION: 1. Stable interstitial prominence, similar to multiple previous chest x-rays suggesting chronic interstitial lung disease. No evidence of active CHF/pulmonary edema. 2. Stable bibasilar opacities, most likely chronic atelectasis and/or pleural thickening. No evidence of pneumonia or pleural effusion. 3. Stable cardiomegaly. 4. Aortic atherosclerosis. Electronically Signed   By: Franki Cabot M.D.   On: 12/12/2017 14:20    Past Medical History:  Diagnosis Date  . Arthritis   . CHF (congestive heart failure) ([place])   . COPD (chronic obstructive pulmonary disease) ([place])   . Depression   . Diabetes mellitus type 2 in obese ([place])   . GERD (gastroesophageal reflux disease)   . Hyperlipidemia   . Hypertension    . Morbid obesity ([place])   . OAB (overactive bladder)   . PSVT (paroxysmal supraventricular tachycardia) ([place])   . Thyroid disease   . Vitamin D deficiency      Allergies  Allergen Reactions  . Inderal [Propranolol] Other (See Comments)    Hair loss  . Ace Inhibitors Cough      Current Outpatient Medications on File Prior to Visit  Medication Sig Dispense Refill  . acetaminophen-codeine (TYLENOL #3) 300-30 MG tablet Take 1 tablet by mouth every 8 (eight) hours as needed for moderate pain or severe pain. 90 tablet 0  . acetaZOLAMIDE (DIAMOX) 250 MG tablet TAKE 1 TABLET (250 MG TOTAL) BY MOUTH 3 (THREE) TIMES DAILY. 270 tablet 1  . albuterol (PROVENTIL HFA;VENTOLIN HFA) 108 (90 Base) MCG/ACT inhaler Inhale 1-2 puffs into the lungs every 6 (six) hours as needed for wheezing or shortness of breath. 18 g 2  . albuterol (PROVENTIL) (2.5 MG/3ML) 0.083% nebulizer solution Take 3 mLs (2.5 mg total) by nebulization every 6 (six) hours as needed for wheezing or shortness of breath. 75 mL 12  . aspirin 81 MG tablet Take 81 mg by mouth daily.    Marland Kitchen atenolol (TENORMIN) 50 MG tablet Take 1 tablet (50 mg total) by mouth daily. 90 tablet 0  . budesonide (PULMICORT) 0.5 MG/2ML  nebulizer solution Take 2 mLs (0.5 mg total) by nebulization 2 (two) times daily. 360 mL 3  . Cholecalciferol (VITAMIN D PO) Take 2,000 Units by mouth daily.     . diazepam (VALIUM) 5 MG tablet Take one tablet up to 2 x a daily for anxiety AS NEEDED 60 tablet 0  . diltiazem (CARDIZEM CD) 300 MG 24 hr capsule TAKE 1 CAPSULE (300 MG TOTAL) BY MOUTH DAILY. 90 capsule 1  . DULoxetine (CYMBALTA) 60 MG capsule TAKE 1 CAPSULE BY MOUTH DAILY (Patient taking differently: TAKE 60 mg  CAPSULE BY MOUTH DAILY) 90 capsule 1  . ferrous sulfate 325 (65 FE) MG tablet Take 325 mg by mouth 2 (two) times daily with a meal.    . fluticasone (FLONASE) 50 MCG/ACT nasal spray Place 2 sprays into both nostrils daily. (Patient taking differently: Place 2  sprays into both nostrils daily as needed for allergies. ) 16 g 0  . furosemide (LASIX) 80 MG tablet Take 1 tablet (80 mg total) by mouth daily. 90 tablet 0  . guaiFENesin (MUCINEX) 600 MG 12 hr tablet Take 600 mg by mouth 2 (two) times daily.     Marland Kitchen ipratropium (ATROVENT) 0.02 % nebulizer solution Take 2.5 mLs (0.5 mg total) by nebulization every 6 (six) hours. 75 mL 12  . levothyroxine (SYNTHROID, LEVOTHROID) 175 MCG tablet TAKE 1 TABLET (175 MCG TOTAL) BY MOUTH DAILY BEFORE BREAKFAST. 90 tablet 1  . loratadine-pseudoephedrine (CLARITIN-D 24-HOUR) 10-240 MG per 24 hr tablet Take 1 tablet by mouth as needed for allergies.     . Magnesium Chloride (MAGNESIUM DR PO) Take 1 capsule by mouth daily.     . metFORMIN (GLUCOPHAGE-XR) 500 MG 24 hr tablet TAKE 1 TABLET (500 MG TOTAL) BY MOUTH 4 (FOUR) TIMES DAILY - AFTER MEALS AND AT BEDTIME. 360 tablet 1  . metolazone (ZAROXOLYN) 5 MG tablet Take 1 pill 1 hour before the lasix every day, call the office monday (Patient taking differently: Take 5 mg by mouth daily. Take 1 pill 1 hour before the lasix every day, call the office monday) 30 tablet 0  . OXYGEN-HELIUM IN Inhale 8 L into the lungs continuous.     . pantoprazole (PROTONIX) 40 MG tablet Take 1 tablet (40 mg total) by mouth daily at 6 (six) AM. 90 tablet 0  . polyethylene glycol (MIRALAX / GLYCOLAX) packet Take 17 g by mouth 2 (two) times daily. 14 each 0  . pravastatin (PRAVACHOL) 40 MG tablet TAKE 1 TABLET BY MOUTH EVERY DAY (Patient taking differently: TAKE 40 mg TABLET BY MOUTH EVERY DAY) 90 tablet 1   No current facility-administered medications on file prior to visit.     ROS: all negative except above.   Physical Exam: Filed Weights   12/24/17 1544  Weight: 234 lb 9.6 oz (106.4 kg)   BP 118/68   Pulse (!) 109   Temp (!) 97.2 F (36.2 C)   Resp 16   Ht 5\' 2"  (1.575 m)   Wt 234 lb 9.6 oz (106.4 kg)   SpO2 90%   BMI 42.91 kg/m  General appearance: alert, no distress, WD/WN,   female HEENT: normocephalic, sclerae anicteric, TMs pearly, nares patent, no discharge or erythema, pharynx normal Oral cavity: MMM, no lesions Neck: supple, no lymphadenopathy, no thyromegaly, no masses Heart: Irreg irreg, normal S1, S2, systolic murmur Lungs: decreased breath sounds, no wheezing no rhonchi, no rales, 8 L O2  Abdomen: +bs, soft, non tender, non distended, no masses, no  hepatomegaly, no splenomegaly Musculoskeletal: nontender, no swelling, no obvious deformity Extremities: 1+ edema, very much improved, + dependent rubor, bilateral legs without erythema, no warmth, no weeping.  Pulses: 2+ symmetric, upper and lower extremities, normal cap refill Neurological: alert, oriented x 3, CN2-12 intact, strength normal upper extremities and lower extremities,  DTRs 2+ throughout, no cerebellar signs, patient in wheelchair due to SOB Psychiatric: normal affect, behavior normal    [name], PA-C 4:11 PM [facility]

## 2017-12-23 NOTE — Telephone Encounter (Signed)
Called patient on 12/15/17 , 2:31 PM   Admit date: 12/12/17 Discharge: 12/16/17    All questions were answered and a follow up appointment was made.   Prior to Admission medications   Medication Sig Start Date End Date Taking? Authorizing Provider  acetaminophen-codeine (TYLENOL #3) 300-30 MG tablet Take 1 tablet by mouth every 8 (eight) hours as needed for moderate pain or severe pain. 11/19/16   [name], PA-C  acetaZOLAMIDE (DIAMOX) 250 MG tablet TAKE 1 TABLET (250 MG TOTAL) BY MOUTH 3 (THREE) TIMES DAILY. 03/23/17   Unk Pinto, MD  albuterol (PROVENTIL HFA;VENTOLIN HFA) 108 (90 Base) MCG/ACT inhaler Inhale 1-2 puffs into the lungs every 6 (six) hours as needed for wheezing or shortness of breath. 05/30/17   [name], PA-C  albuterol (PROVENTIL) (2.5 MG/3ML) 0.083% nebulizer solution Take 3 mLs (2.5 mg total) by nebulization every 6 (six) hours as needed for wheezing or shortness of breath. 11/10/17   [name], PA-C  aspirin 81 MG tablet Take 81 mg by mouth daily.    [provider]  atenolol (TENORMIN) 50 MG tablet Take 1 tablet (50 mg total) by mouth daily. 10/15/17   Unk Pinto, MD  budesonide (PULMICORT) 0.5 MG/2ML nebulizer solution Take 2 mLs (0.5 mg total) by nebulization 2 (two) times daily. 03/20/17   [name], PA-C  Cholecalciferol (VITAMIN D PO) Take 2,000 Units by mouth daily.     [provider]  diazepam (VALIUM) 5 MG tablet Take one tablet up to 2 x a daily for anxiety AS NEEDED 03/20/17   [name], PA-C  diltiazem (CARDIZEM CD) 300 MG 24 hr capsule TAKE 1 CAPSULE (300 MG TOTAL) BY MOUTH DAILY. 06/21/17   Unk Pinto, MD  DULoxetine (CYMBALTA) 60 MG capsule TAKE 1 CAPSULE BY MOUTH DAILY Patient taking differently: TAKE 60 mg  CAPSULE BY MOUTH DAILY 08/30/17   Unk Pinto, MD  ferrous sulfate 325 (65 FE) MG tablet Take 325 mg by mouth 2 (two) times daily with a meal.    [provider]  fluticasone (FLONASE) 50  MCG/ACT nasal spray Place 2 sprays into both nostrils daily. Patient taking differently: Place 2 sprays into both nostrils daily as needed for allergies.  10/29/16   Forcucci, Courtney, PA-C  furosemide (LASIX) 80 MG tablet Take 1 tablet (80 mg total) by mouth daily. 08/12/17   Unk Pinto, MD  guaiFENesin (MUCINEX) 600 MG 12 hr tablet Take 600 mg by mouth 2 (two) times daily.     [provider]  ipratropium (ATROVENT) 0.02 % nebulizer solution Take 2.5 mLs (0.5 mg total) by nebulization every 6 (six) hours. 08/20/17   Lavina Hamman, MD  levothyroxine (SYNTHROID, LEVOTHROID) 175 MCG tablet TAKE 1 TABLET (175 MCG TOTAL) BY MOUTH DAILY BEFORE BREAKFAST. 06/21/17   Unk Pinto, MD  loratadine-pseudoephedrine (CLARITIN-D 24-HOUR) 10-240 MG per 24 hr tablet Take 1 tablet by mouth as needed for allergies.     [provider]  Magnesium Chloride (MAGNESIUM DR PO) Take 1 capsule by mouth daily.     [provider]  metFORMIN (GLUCOPHAGE-XR) 500 MG 24 hr tablet TAKE 1 TABLET (500 MG TOTAL) BY MOUTH 4 (FOUR) TIMES DAILY - AFTER MEALS AND AT BEDTIME. 04/29/17   Unk Pinto, MD  metolazone (ZAROXOLYN) 5 MG tablet Take 1 pill 1 hour before the lasix every day, call the office monday Patient taking differently: Take 5 mg by mouth daily. Take 1 pill 1 hour before the lasix every day, call  the office monday 12/05/17   [name], PA-C  OXYGEN-HELIUM IN Inhale 8 L into the lungs continuous.     [provider]  pantoprazole (PROTONIX) 40 MG tablet Take 1 tablet (40 mg total) by mouth daily at 6 (six) AM. 10/13/17   Unk Pinto, MD  polyethylene glycol [facility] / Floria Raveling) packet Take 17 g by mouth 2 (two) times daily. 08/20/17   Lavina Hamman, MD  pravastatin (PRAVACHOL) 40 MG tablet TAKE 1 TABLET BY MOUTH EVERY DAY Patient taking differently: TAKE 40 mg TABLET BY MOUTH EVERY DAY 11/25/17   Liane Comber, NP

## 2017-12-24 ENCOUNTER — Ambulatory Visit: Payer: Self-pay | Admitting: Adult Health

## 2017-12-24 ENCOUNTER — Ambulatory Visit (INDEPENDENT_AMBULATORY_CARE_PROVIDER_SITE_OTHER): Payer: PPO | Admitting: Physician Assistant

## 2017-12-24 ENCOUNTER — Encounter: Payer: Self-pay | Admitting: Physician Assistant

## 2017-12-24 VITALS — BP 118/68 | HR 109 | Temp 97.2°F | Resp 16 | Ht 62.0 in | Wt 234.6 lb

## 2017-12-24 DIAGNOSIS — E876 Hypokalemia: Secondary | ICD-10-CM

## 2017-12-24 DIAGNOSIS — I10 Essential (primary) hypertension: Secondary | ICD-10-CM

## 2017-12-24 DIAGNOSIS — I272 Pulmonary hypertension, unspecified: Secondary | ICD-10-CM

## 2017-12-24 DIAGNOSIS — I471 Supraventricular tachycardia: Secondary | ICD-10-CM

## 2017-12-24 DIAGNOSIS — J9612 Chronic respiratory failure with hypercapnia: Secondary | ICD-10-CM | POA: Diagnosis not present

## 2017-12-24 DIAGNOSIS — I5032 Chronic diastolic (congestive) heart failure: Secondary | ICD-10-CM | POA: Diagnosis not present

## 2017-12-24 DIAGNOSIS — I420 Dilated cardiomyopathy: Secondary | ICD-10-CM

## 2017-12-24 DIAGNOSIS — I700 Atherosclerosis of aorta: Secondary | ICD-10-CM | POA: Diagnosis not present

## 2017-12-24 DIAGNOSIS — J449 Chronic obstructive pulmonary disease, unspecified: Secondary | ICD-10-CM | POA: Diagnosis not present

## 2017-12-24 DIAGNOSIS — I4892 Unspecified atrial flutter: Secondary | ICD-10-CM

## 2017-12-24 DIAGNOSIS — J9602 Acute respiratory failure with hypercapnia: Secondary | ICD-10-CM

## 2017-12-24 DIAGNOSIS — N183 Chronic kidney disease, stage 3 unspecified: Secondary | ICD-10-CM

## 2017-12-24 DIAGNOSIS — R0603 Acute respiratory distress: Secondary | ICD-10-CM

## 2017-12-24 DIAGNOSIS — J9601 Acute respiratory failure with hypoxia: Secondary | ICD-10-CM

## 2017-12-24 DIAGNOSIS — G4733 Obstructive sleep apnea (adult) (pediatric): Secondary | ICD-10-CM

## 2017-12-24 LAB — CBC WITH DIFFERENTIAL/PLATELET
BASOS PCT: 0.5 %
Basophils Absolute: 33 cells/uL (ref 0–200)
EOS ABS: 39 {cells}/uL (ref 15–500)
EOS PCT: 0.6 %
HCT: 38.7 % (ref 35.0–45.0)
HEMOGLOBIN: 11.5 g/dL — AB (ref 11.7–15.5)
Lymphs Abs: 904 cells/uL (ref 850–3900)
MCH: 24 pg — AB (ref 27.0–33.0)
MCHC: 29.7 g/dL — ABNORMAL LOW (ref 32.0–36.0)
MCV: 80.6 fL (ref 80.0–100.0)
MONOS PCT: 6.8 %
MPV: 10.2 fL (ref 7.5–12.5)
NEUTROS ABS: 5083 {cells}/uL (ref 1500–7800)
Neutrophils Relative %: 78.2 %
Platelets: 266 10*3/uL (ref 140–400)
RBC: 4.8 10*6/uL (ref 3.80–5.10)
RDW: 16.6 % — ABNORMAL HIGH (ref 11.0–15.0)
Total Lymphocyte: 13.9 %
WBC mixed population: 442 cells/uL (ref 200–950)
WBC: 6.5 10*3/uL (ref 3.8–10.8)

## 2017-12-24 LAB — BASIC METABOLIC PANEL WITH GFR
BUN/Creatinine Ratio: 22 (calc) (ref 6–22)
BUN: 35 mg/dL — AB (ref 7–25)
CALCIUM: 9.7 mg/dL (ref 8.6–10.4)
CO2: 38 mmol/L — AB (ref 20–32)
CREATININE: 1.58 mg/dL — AB (ref 0.60–0.93)
Chloride: 96 mmol/L — ABNORMAL LOW (ref 98–110)
GFR, Est African American: 38 mL/min/{1.73_m2} — ABNORMAL LOW (ref >=60)
GFR, Est Non African American: 33 mL/min/{1.73_m2} — ABNORMAL LOW (ref >=60)
GLUCOSE: 143 mg/dL — AB (ref 65–99)
Potassium: 4.9 mmol/L (ref 3.5–5.3)
Sodium: 141 mmol/L (ref 135–146)

## 2017-12-24 LAB — HEPATIC FUNCTION PANEL
AG Ratio: 1.6 (calc) (ref 1.0–2.5)
ALBUMIN MSPROF: 4.1 g/dL (ref 3.6–5.1)
ALT: 11 U/L (ref 6–29)
AST: 17 U/L (ref 10–35)
Alkaline phosphatase (APISO): 79 U/L (ref 33–130)
Bilirubin, Direct: 0.2 mg/dL (ref 0.0–0.2)
GLOBULIN: 2.6 g/dL (ref 1.9–3.7)
Indirect Bilirubin: 0.3 mg/dL (calc) (ref 0.2–1.2)
TOTAL PROTEIN: 6.7 g/dL (ref 6.1–8.1)
Total Bilirubin: 0.5 mg/dL (ref 0.2–1.2)

## 2017-12-24 NOTE — Patient Instructions (Addendum)
Continue using your BiPAP Please monitor your weight daily- goal dry weight will be 230 If you weight is up to 235lb take the zaroxyln 5mg  the next day 30 mins before the lasix 80 mg   Can stop the protonix and switch to the famotidine you have at home   Do the following things EVERYDAY: 1) Weigh yourself in the morning before breakfast or at the same time every day. Write it down and keep it in a log. 2) Take your medicines as prescribed 3) Eat low salt foods-Limit salt (sodium) to 2000 mg per day. Best thing to do is avoid processed foods.   4) Stay as active as you can everyday 5) Limit all fluids for the day to less than 1.5 liters  Call your doctor if:  Anytime you have any of the following symptoms:  1) 2 pound weight gain in 24 hours or 5 pounds in 1 week  2) shortness of breath, with or without a dry hacking cough  3) swelling in the hands, LEGs, feet or stomach  4) if you have to sleep on extra pillows at night in order to breathe. 5) after laying down at night for 20-30 mins, you wake up short of breath.   These can all be signs of fluid overload.    Heart Failure -Heart failure means your heart has trouble pumping blood. This makes it hard for your body to work well. Heart failure is usually a long-term (chronic) condition. You must take good care of yourself and follow your doctor's treatment plan. Follow these instructions at home:  Take your heart medicine as told by your doctor. ? Do not stop taking medicine unless your doctor tells you to. ? Do not skip any dose of medicine. ? Refill your medicines before they run out. ? Take other medicines only as told by your doctor or pharmacist.  Stay active if told by your doctor. The elderly and people with severe heart failure should talk with a doctor about physical activity.  Eat heart-healthy foods. Choose foods that are without trans fat and are low in saturated fat, cholesterol, and salt (sodium). This includes  fresh or frozen fruits and vegetables, fish, lean meats, fat-free or low-fat dairy foods, whole grains, and high-fiber foods. Lentils and dried peas and beans (legumes) are also good choices.  Limit salt if told by your doctor.  Cook in a healthy way. Roast, grill, broil, bake, poach, steam, or stir-fry foods.  Limit fluids as told by your doctor.  Weigh yourself every morning. Do this after you pee (urinate) and before you eat breakfast. Write down your weight to give to your doctor.  Take your blood pressure and write it down if your doctor tells you to.  Ask your doctor how to check your pulse. Check your pulse as told.  Lose weight if told by your doctor.  Stop smoking or chewing tobacco. Do not use gum or patches that help you quit without your doctor's approval.  Schedule and go to doctor visits as told.  Nonpregnant women should have no more than 1 drink a day. Men should have no more than 2 drinks a day. Talk to your doctor about drinking alcohol.  Stop illegal drug use.  Stay current with shots (immunizations).  Manage your health conditions as told by your doctor.  Learn to manage your stress.  Rest when you are tired.  If it is really hot outside: ? Avoid intense activities. ? Use air conditioning or  fans, or get in a cooler place. ? Avoid caffeine and alcohol. ? Wear loose-fitting, lightweight, and light-colored clothing.  If it is really cold outside: ? Avoid intense activities. ? Layer your clothing. ? Wear mittens or gloves, a hat, and a scarf when going outside. ? Avoid alcohol.  Learn about heart failure and get support as needed.  Get help to maintain or improve your quality of life and your ability to care for yourself as needed. Contact a doctor if:  You gain weight quickly.  You are more short of breath than usual.  You cannot do your normal activities.  You tire easily.  You cough more than normal, especially with activity.  You have  any or more puffiness (swelling) in areas such as your hands, feet, ankles, or belly (abdomen).  You cannot sleep because it is hard to breathe.  You feel like your heart is beating fast (palpitations).  You get dizzy or light-headed when you stand up. Get help right away if:  You have trouble breathing.  There is a change in mental status, such as becoming less alert or not being able to focus.  You have chest pain or discomfort.  You faint. This information is not intended to replace advice given to you by your health care provider. Make sure you discuss any questions you have with your health care provider. Document Released: 06/11/2008 Document Revised: 02/08/2016 Document Reviewed: 10/19/2012 Elsevier Interactive Patient Education  2017 [employer]    Chronic Obstructive Pulmonary Disease -Chronic obstructive pulmonary disease (COPD) is a long-term (chronic) condition that affects the lungs. COPD is a general term that can be used to describe many different lung problems that cause lung swelling (inflammation) and limit airflow, including chronic bronchitis and emphysema. If you have COPD, your lung function will probably never return to normal. In most cases, it gets worse over time. However, there are steps you can take to slow the progression of the disease and improve your quality of life. What are the causes? This condition may be caused by:  Smoking. This is the most common cause.  Certain genes passed down through families.  What increases the risk? The following factors may make you more likely to develop this condition:  Secondhand smoke from cigarettes, pipes, or cigars.  Exposure to chemicals and other irritants such as fumes and dust in the work environment.  Chronic lung conditions or infections.  What are the signs or symptoms? Symptoms of this condition include:  Shortness of breath, especially during physical activity.  Chronic cough with a large  amount of thick mucus. Sometimes the cough may not have any mucus (dry cough).  Wheezing.  Rapid breaths.  Gray or bluish discoloration (cyanosis) of the skin, especially in your fingers, toes, or lips.  Feeling tired (fatigue).  Weight loss.  Chest tightness.  Frequent infections.  Episodes when breathing symptoms become much worse (exacerbations).  Swelling in the ankles, feet, or legs. This may occur in later stages of the disease.  How is this diagnosed? This condition is diagnosed based on:  Your medical history.  A physical exam.  You may also have tests, including:  Lung (pulmonary) function tests. This may include a spirometry test, which measures your ability to exhale properly.  Chest X-ray.  CT scan.  Blood tests.  How is this treated? This condition may be treated with:  Medicines. These may include inhaled rescue medicines to treat acute exacerbations as well as long-term, or maintenance, medicines to  prevent flare-ups of COPD. ? Bronchodilators help treat COPD by dilating the airways to allow increased airflow and make your breathing more comfortable. ? Steroids can reduce airway inflammation and help prevent exacerbations.  Smoking cessation. If you smoke, your health care provider may ask you to quit, and may also recommend therapy or replacement products to help you quit.  Pulmonary rehabilitation. This may involve working with a team of health care providers and specialists, such as respiratory, occupational, and physical therapists.  Exercise and physical activity. These are beneficial for nearly all people with COPD.  Nutrition therapy to gain weight, if you are underweight.  Oxygen. Supplemental oxygen therapy is only helpful if you have a low oxygen level in your blood (hypoxemia).  Lung surgery or transplant.  Palliative care. This is to help people with COPD feel comfortable when treatment is no longer working.  Follow these  instructions at home: Medicines  Take over-the-counter and prescription medicines (inhaled or pills) only as told by your health care provider.  Talk to your health care provider before taking any cough or allergy medicines. You may need to avoid certain medicines that dry out your airways. Lifestyle  If you are a smoker, the most important thing that you can do is to stop smoking. Do not use any products that contain nicotine or tobacco, such as cigarettes and e-cigarettes. If you need help quitting, ask your health care provider. Continuing to smoke will cause the disease to progress faster.  Avoid exposure to things that irritate your lungs, such as smoke, chemicals, and fumes.  Stay active, but balance activity with periods of rest. Exercise and physical activity will help you maintain your ability to do things you want to do.  Learn and use relaxation techniques to manage stress and to control your breathing.  Get the right amount of sleep and get quality sleep. Most adults need 7 or more hours per night.  Eat healthy foods. Eating smaller, more frequent meals and resting before meals may help you maintain your strength. Controlled breathing Learn and use controlled breathing techniques as directed by your health care provider. Controlled breathing techniques include:  Pursed lip breathing. Start by breathing in (inhaling) through your nose for 1 second. Then, purse your lips as if you were going to whistle and breathe out (exhale) through the pursed lips for 2 seconds.  Diaphragmatic breathing. Start by putting one hand on your abdomen just above your waist. Inhale slowly through your nose. The hand on your abdomen should move out. Then purse your lips and exhale slowly. You should be able to feel the hand on your abdomen moving in as you exhale.  Controlled coughing Learn and use controlled coughing to clear mucus from your lungs. Controlled coughing is a series of short,  progressive coughs. The steps of controlled coughing are: 1. Lean your head slightly forward. 2. Breathe in deeply using diaphragmatic breathing. 3. Try to hold your breath for 3 seconds. 4. Keep your mouth slightly open while coughing twice. 5. Spit any mucus out into a tissue. 6. Rest and repeat the steps once or twice as needed.  General instructions  Make sure you receive all the vaccines that your health care provider recommends, especially the pneumococcal and influenza vaccines. Preventing infection and hospitalization is very important when you have COPD.  Use oxygen therapy and pulmonary rehabilitation if directed to by your health care provider. If you require home oxygen therapy, ask your health care provider whether you should purchase  a pulse oximeter to measure your oxygen level at home.  Work with your health care provider to develop a COPD action plan. This will help you know what steps to take if your condition gets worse.  Keep other chronic health conditions under control as told by your health care provider.  Avoid extreme temperature and humidity changes.  Avoid contact with people who have an illness that spreads from person to person (is contagious), such as viral infections or pneumonia.  Keep all follow-up visits as told by your health care provider. This is important. Contact a health care provider if:  You are coughing up more mucus than usual.  There is a change in the color or thickness of your mucus.  Your breathing is more labored than usual.  Your breathing is faster than usual.  You have difficulty sleeping.  You need to use your rescue medicines or inhalers more often than expected.  You have trouble doing routine activities such as getting dressed or walking around the house. Get help right away if:  You have shortness of breath while you are resting.  You have shortness of breath that prevents you from: ? Being able to talk. ? Performing  your usual physical activities.  You have chest pain lasting longer than 5 minutes.  Your skin color is more blue (cyanotic) than usual.  You measure low oxygen saturations for longer than 5 minutes with a pulse oximeter.  You have a fever.  You feel too tired to breathe normally. Summary  Chronic obstructive pulmonary disease (COPD) is a long-term (chronic) condition that affects the lungs.  Your lung function will probably never return to normal. In most cases, it gets worse over time. However, there are steps you can take to slow the progression of the disease and improve your quality of life.  Treatment for COPD may include taking medicines, quitting smoking, pulmonary rehabilitation, and changes to diet and exercise. As the disease progresses, you may need oxygen therapy, a lung transplant, or palliative care.  To help manage your condition, do not smoke, avoid exposure to things that irritate your lungs, stay up to date on all vaccines, and follow your health care provider's instructions for taking medicines. This information is not intended to replace advice given to you by your health care provider. Make sure you discuss any questions you have with your health care provider. Document Released: 06/12/2005 Document Revised: 10/07/2016 Document Reviewed: 10/07/2016 Elsevier Interactive Patient Education  [employer].

## 2017-12-26 ENCOUNTER — Telehealth: Payer: Self-pay | Admitting: Physician Assistant

## 2017-12-26 NOTE — Telephone Encounter (Signed)
Nicolette w/ [facility], Called to advise that patient is refusing Skilled Nursing that was ordered at hospistal discharge. PT was also ordered at [facility] discharge. Patient would like to continue the PT w/ [facility]. Per [name], gave verbal ok  for admit to PT home health with [facility]. Faxed order to [phone].

## 2017-12-28 ENCOUNTER — Other Ambulatory Visit: Payer: Self-pay | Admitting: Physician Assistant

## 2017-12-29 ENCOUNTER — Ambulatory Visit (INDEPENDENT_AMBULATORY_CARE_PROVIDER_SITE_OTHER): Payer: PPO | Admitting: Orthopedic Surgery

## 2017-12-30 ENCOUNTER — Encounter (INDEPENDENT_AMBULATORY_CARE_PROVIDER_SITE_OTHER): Payer: Self-pay

## 2017-12-31 ENCOUNTER — Other Ambulatory Visit: Payer: Self-pay | Admitting: Physician Assistant

## 2017-12-31 ENCOUNTER — Other Ambulatory Visit: Payer: Self-pay | Admitting: Internal Medicine

## 2018-01-01 ENCOUNTER — Ambulatory Visit: Payer: PPO | Admitting: Physician Assistant

## 2018-01-04 ENCOUNTER — Other Ambulatory Visit: Payer: Self-pay | Admitting: Internal Medicine

## 2018-01-05 DIAGNOSIS — R29898 Other symptoms and signs involving the musculoskeletal system: Secondary | ICD-10-CM | POA: Diagnosis not present

## 2018-01-05 DIAGNOSIS — M5440 Lumbago with sciatica, unspecified side: Secondary | ICD-10-CM | POA: Diagnosis not present

## 2018-01-05 DIAGNOSIS — J449 Chronic obstructive pulmonary disease, unspecified: Secondary | ICD-10-CM | POA: Diagnosis not present

## 2018-01-06 ENCOUNTER — Encounter: Payer: Self-pay | Admitting: Internal Medicine

## 2018-01-06 ENCOUNTER — Ambulatory Visit (INDEPENDENT_AMBULATORY_CARE_PROVIDER_SITE_OTHER): Payer: PPO | Admitting: Internal Medicine

## 2018-01-06 VITALS — BP 126/86 | HR 88 | Temp 97.0°F | Resp 18 | Ht 62.0 in | Wt 234.6 lb

## 2018-01-06 DIAGNOSIS — N183 Chronic kidney disease, stage 3 unspecified: Secondary | ICD-10-CM

## 2018-01-06 DIAGNOSIS — I10 Essential (primary) hypertension: Secondary | ICD-10-CM | POA: Diagnosis not present

## 2018-01-06 DIAGNOSIS — J9612 Chronic respiratory failure with hypercapnia: Secondary | ICD-10-CM

## 2018-01-06 DIAGNOSIS — Z79899 Other long term (current) drug therapy: Secondary | ICD-10-CM | POA: Diagnosis not present

## 2018-01-06 DIAGNOSIS — I272 Pulmonary hypertension, unspecified: Secondary | ICD-10-CM

## 2018-01-06 NOTE — Progress Notes (Signed)
Subjective:    Patient ID: Jillian Wood, female    DOB: [birth year], 72 y.o.   MRN: [id number]  HPI  This very nice 72 yo WWF with multiple severe co morbidities including HTN, COPD/Pulm HTN /OSA overlap who returns for close f/u after hospitalization 3/29-12/16/2017 with Acute Respiratory Failure w/ Hypoxia & Hypercapnia. She also has Chronic CHF and dry weight is guesstimated at 320#. She also has hx/o Afib/Flut and is off of anticoagulation due to life compromising GI bleeding. Patient's respiratory status is severe COPD requiring 8 Lit/min O2 with tendency to CO2 retention with O2 sat's target goal of 90-92% max .   Medication Sig  . acetaminophen-codeine (TYLENOL #3) 300-30 MG tablet Take 1 tablet by mouth every 8 (eight) hours as needed for moderate pain or severe pain.  Marland Kitchen acetaZOLAMIDE (DIAMOX) 250 MG tablet TAKE 1 TABLET (250 MG TOTAL) BY MOUTH 3 (THREE) TIMES DAILY.  Marland Kitchen albuterol (PROVENTIL HFA;VENTOLIN HFA) 108 (90 Base) MCG/ACT inhaler Inhale 1-2 puffs into the lungs every 6 (six) hours as needed for wheezing or shortness of breath.  Marland Kitchen albuterol (PROVENTIL) (2.5 MG/3ML) 0.083% nebulizer solution Take 3 mLs (2.5 mg total) by nebulization every 6 (six) hours as needed for wheezing or shortness of breath.  Marland Kitchen aspirin 81 MG tablet Take 81 mg by mouth daily.  Marland Kitchen atenolol (TENORMIN) 50 MG tablet TAKE 1 TABLET BY MOUTH EVERY DAY  . budesonide (PULMICORT) 0.5 MG/2ML nebulizer solution Take 2 mLs (0.5 mg total) by nebulization 2 (two) times daily.  . Cholecalciferol (VITAMIN D PO) Take 2,000 Units by mouth daily.   . diazepam (VALIUM) 5 MG tablet Take one tablet up to 2 x a daily for anxiety AS NEEDED  . diltiazem (CARDIZEM CD) 300 MG 24 hr capsule TAKE 1 CAPSULE (300 MG TOTAL) BY MOUTH DAILY.  . DULoxetine (CYMBALTA) 60 MG capsule TAKE 1 CAPSULE BY MOUTH DAILY (Patient taking differently: TAKE 60 mg  CAPSULE BY MOUTH DAILY)  . famotidine (PEPCID) 40 MG tablet Take 40 mg by mouth 2 (two) times  daily.  . fluticasone (FLONASE) 50 MCG/ACT nasal spray Place 2 sprays into both nostrils daily. (Patient taking differently: Place 2 sprays into both nostrils daily as needed for allergies. )  . furosemide (LASIX) 80 MG tablet Take 1 tablet (80 mg total) by mouth daily.  Marland Kitchen guaiFENesin (MUCINEX) 600 MG 12 hr tablet Take 600 mg by mouth 2 (two) times daily.   Marland Kitchen ipratropium (ATROVENT) 0.02 % nebulizer solution Take 2.5 mLs (0.5 mg total) by nebulization every 6 (six) hours.  Marland Kitchen levothyroxine (SYNTHROID, LEVOTHROID) 175 MCG tablet TAKE 1 TABLET (175 MCG TOTAL) BY MOUTH DAILY BEFORE BREAKFAST.  Marland Kitchen loratadine-pseudoephedrine (CLARITIN-D 24-HOUR) 10-240 MG per 24 hr tablet Take 1 tablet by mouth as needed for allergies.   . Magnesium Chloride (MAGNESIUM DR PO) Take 1 capsule by mouth daily.   . metFORMIN (GLUCOPHAGE-XR) 500 MG 24 hr tablet TAKE 1 TABLET (500 MG TOTAL) BY MOUTH 4 (FOUR) TIMES DAILY - AFTER MEALS AND AT BEDTIME.  . metolazone (ZAROXOLYN) 5 MG tablet Take 1 pill 1 hour before the lasix daily as needed for weight gain/edema.  Marland Kitchen OVER THE COUNTER MEDICATION Slow-release iron 45 mg 2 tablets daily  . OXYGEN-HELIUM IN Inhale 8 L into the lungs continuous.   . polyethylene glycol (MIRALAX / GLYCOLAX) packet Take 17 g by mouth 2 (two) times daily.  . pravastatin (PRAVACHOL) 40 MG tablet TAKE 1 TABLET BY MOUTH EVERY DAY (Patient taking differently:  TAKE 40 mg TABLET BY MOUTH EVERY DAY)  . ferrous sulfate 325 (65 FE) MG tablet Take 325 mg by mouth 2 (two) times daily with a meal.  . pantoprazole (PROTONIX) 40 MG tablet TAKE 1 TABLET (40 MG TOTAL) BY MOUTH DAILY AT 6 (SIX) AM.   Allergies  Allergen Reactions  . Inderal [Propranolol] Other (See Comments)    Hair loss  . Ace Inhibitors Cough   Past Medical History:  Diagnosis Date  . Arthritis   . CHF (congestive heart failure) ([place])   . COPD (chronic obstructive pulmonary disease) ([place])   . Depression   . Diabetes mellitus type 2 in obese  ([place])   . GERD (gastroesophageal reflux disease)   . Hyperlipidemia   . Hypertension   . Morbid obesity ([place])   . OAB (overactive bladder)   . PSVT (paroxysmal supraventricular tachycardia) ([place])   . Thyroid disease   . Vitamin D deficiency    Past Surgical History:  Procedure Laterality Date  . CARPAL TUNNEL RELEASE     L 1992 R 1984  . COLONOSCOPY N/A 08/19/2017   Procedure: COLONOSCOPY;  Surgeon: Milus Banister, MD;  Location: [facility] ENDOSCOPY;  Service: Endoscopy;  Laterality: N/A;  . EYE SURGERY Bilateral    cataract  . NM MYOVIEW LTD  01/2011   Dobutamine Myoview: Negative perfusion scan for ischemia / infarct (poor image capture); Pt developed SVT with Dobutamine that reproduced her CP as it resolved with restoration of NSR.  Marland Kitchen PUBOVAGINAL SLING    . TEE WITHOUT CARDIOVERSION N/A 12/16/2016   Procedure: TRANSESOPHAGEAL ECHOCARDIOGRAM (TEE);  Surgeon: Sanda Klein, MD;  Location: [facility] ENDOSCOPY;  Service: Cardiovascular;  Laterality: N/A;  . TONSILLECTOMY AND ADENOIDECTOMY    . TRANSTHORACIC ECHOCARDIOGRAM  01/2011   Hyperdynamic LV with EF 65-70%, Gr 1 DD, Mild Ao Stenosis - mean gradient ~19 mmHg   10 point systems review negative except as above.    Objective:   Physical Exam  BP 126/86   Pulse 88   Temp (!) 97 F (36.1 C)   Resp 18   Ht 5\' 2"  (1.575 m)   Wt 234 lb 9.6 oz (106.4 kg)   SpO2 (!) 85%   BMI 42.91 kg/m   Morbidly Obese and unkempt on nasal O2 in a Wheelchair & with  no stridor.   HEENT - WNL. Neck - supple.  Chest - Inc AP with very distant  BS. Cor - Soft HS. iRR w/o sig m. PP obscured by  Pedal edema. MS- FROM w/o deformities.  Wheel chair. Neuro -  Nl w/o focal abnormalities.    Assessment & Plan:   1. Essential hypertension  - CBC with Differential/Platelet - COMPLETE METABOLIC PANEL WITH GFR  2. Chronic respiratory failure with hypercapnia (HCC)  - CBC with Differential/Platelet - COMPLETE METABOLIC PANEL WITH GFR  3. Pulmonary  hypertension ([place])   4. CKD (chronic kidney disease) stage 3, GFR 30-59 ml/min (HCC)  - COMPLETE METABOLIC PANEL WITH GFR  5. Medication management  - CBC with Differential/Platelet - COMPLETE METABOLIC PANEL WITH GFR  - discussed meds & SE's.

## 2018-01-06 NOTE — Patient Instructions (Signed)

## 2018-01-07 ENCOUNTER — Ambulatory Visit: Payer: PPO | Admitting: Pulmonary Disease

## 2018-01-07 ENCOUNTER — Other Ambulatory Visit: Payer: Self-pay

## 2018-01-07 ENCOUNTER — Encounter: Payer: Self-pay | Admitting: Pulmonary Disease

## 2018-01-07 ENCOUNTER — Other Ambulatory Visit: Payer: Self-pay | Admitting: Physician Assistant

## 2018-01-07 VITALS — BP 110/68 | HR 71 | Ht 65.0 in | Wt 232.4 lb

## 2018-01-07 DIAGNOSIS — J449 Chronic obstructive pulmonary disease, unspecified: Secondary | ICD-10-CM | POA: Diagnosis not present

## 2018-01-07 DIAGNOSIS — J9611 Chronic respiratory failure with hypoxia: Secondary | ICD-10-CM | POA: Diagnosis not present

## 2018-01-07 DIAGNOSIS — J9612 Chronic respiratory failure with hypercapnia: Secondary | ICD-10-CM | POA: Diagnosis not present

## 2018-01-07 MED ORDER — ACETAMINOPHEN-CODEINE #3 300-30 MG PO TABS: 1.0000 | ORAL_TABLET | Freq: Three times a day (TID) | ORAL | 0 refills | Status: DC | PRN

## 2018-01-07 MED ORDER — ACETAMINOPHEN-CODEINE #3 300-30 MG PO TABS: 1.0000 | ORAL_TABLET | Freq: Three times a day (TID) | ORAL | 0 refills | Status: AC | PRN

## 2018-01-07 NOTE — Patient Instructions (Signed)
Will arrange for referral to palliative care program  Follow up in 4 months 

## 2018-01-07 NOTE — Progress Notes (Signed)
[facility]  Chief Complaint  Patient presents with  . Follow-up    Hospital visit 3/29 for resp. failure. On 8L cont and  10L with activity.     Vital signs: BP 110/68 (BP Location: Left Arm, Cuff Size: Normal)   Pulse 71   Ht 5\' 5"  (1.651 m)   Wt 232 lb 6.4 oz (105.4 kg)   SpO2 92%   BMI 38.67 kg/m   History of Present Illness: Jillian Wood is a 72 y.o. female COPD with chronic bronchitis and respiratory failure with hypoxia and hypercapnia.  She was in hospital for CHF and COPD exacerbation.  Breathing at baseline now.  Not having cough, wheeze, or sputum.  Needs 8 to 10 liters oxygen.  Uses Bipap at night.  Not having swelling.  Not very active.   Physical Exam:  General - pleasant, wearing oxygen Eyes - pupils reactive ENT - no sinus tenderness, no oral exudate, no LAN Cardiac - regular, no murmur Chest - no wheeze, rales Abd - soft, non tender Ext - no edema Skin - no rashes Neuro - normal strength Psych - normal mood  Assessment/Plan:  COPD with chronic bronchitis. - continue pulmicort, ipratropium, albuterol  Chronic hypoxic,hypercapnic respiratory failure. - continue 8 to 10 liters oxygen  - continue Bipap at night and prn during the day  WHO group 2 and 3 pulmonary hypertension. - continue to optimize secondary causes of this   Goals of care. - had lengthy discussion - will refer to palliative care in [facility]; explained difference between palliative care and hospice care   Patient Instructions  Will arrange for referral to palliative care program   Follow up in 4 months    Chesley Mires, MD [place] 01/07/2018, 3:11 PM  Flow Sheet  Pulmonary tests: Spirometry 01/10/17 >> FEV1 0.92 (37%), FEV1% 77  Cardiac tests Echo 12/11/16 >> EF 65 to 70%, mass on mitral valve, mod TR, PAS 61 mmHg  Past Medical History: She  has a past medical history of Arthritis, CHF  (congestive heart failure) ([place]), COPD (chronic obstructive pulmonary disease) ([place]), Depression, Diabetes mellitus type 2 in obese ([place]), GERD (gastroesophageal reflux disease), Hyperlipidemia, Hypertension, Morbid obesity ([place]), OAB (overactive bladder), PSVT (paroxysmal supraventricular tachycardia) ([place]), Thyroid disease, and Vitamin D deficiency.  Past Surgical History: She  has a past surgical history that includes Carpal tunnel release; Eye surgery (Bilateral); Pubovaginal sling; Tonsillectomy and adenoidectomy; transthoracic echocardiogram (01/2011); NM MYOVIEW LTD (01/2011); TEE without cardioversion (N/A, 12/16/2016); and Colonoscopy (N/A, 08/19/2017).  Family History: Her family history includes Alcohol abuse in her father; Heart disease in her father.  Social History: She  reports that she quit smoking about 17 months ago. Her smoking use included cigarettes. She has a 22.00 pack-year smoking history. She has never used smokeless tobacco. She reports that she does not drink alcohol or use drugs.  Medications: Allergies as of 01/07/2018      Reactions   Inderal [propranolol] Other (See Comments)   Hair loss   Ace Inhibitors Cough      Medication List        Accurate as of 01/07/18  3:11 PM. Always use your most recent med list.          acetaminophen-codeine 300-30 MG tablet Commonly known as:  TYLENOL #3 Take 1 tablet by mouth every 8 (eight) hours as needed for moderate pain or severe pain.   acetaZOLAMIDE 250 MG tablet Commonly known as:  DIAMOX TAKE 1 TABLET (250 MG TOTAL) BY MOUTH 3 (THREE) TIMES DAILY.   albuterol 108 (90 Base) MCG/ACT inhaler Commonly known as:  PROVENTIL HFA;VENTOLIN HFA Inhale 1-2 puffs into the lungs every 6 (six) hours as needed for wheezing or shortness of breath.   albuterol (2.5 MG/3ML) 0.083% nebulizer solution Commonly known as:  PROVENTIL Take 3 mLs (2.5 mg total) by nebulization every 6 (six) hours as needed for wheezing or shortness of  breath.   aspirin 81 MG tablet Take 81 mg by mouth daily.   atenolol 50 MG tablet Commonly known as:  TENORMIN TAKE 1 TABLET BY MOUTH EVERY DAY   budesonide 0.5 MG/2ML nebulizer solution Commonly known as:  PULMICORT Take 2 mLs (0.5 mg total) by nebulization 2 (two) times daily.   diazepam 5 MG tablet Commonly known as:  VALIUM Take one tablet up to 2 x a daily for anxiety AS NEEDED   diltiazem 300 MG 24 hr capsule Commonly known as:  CARDIZEM CD TAKE 1 CAPSULE (300 MG TOTAL) BY MOUTH DAILY.   DULoxetine 60 MG capsule Commonly known as:  CYMBALTA TAKE 1 CAPSULE BY MOUTH DAILY   famotidine 40 MG tablet Commonly known as:  PEPCID Take 40 mg by mouth 2 (two) times daily.   fluticasone 50 MCG/ACT nasal spray Commonly known as:  FLONASE Place 2 sprays into both nostrils daily.   furosemide 80 MG tablet Commonly known as:  LASIX Take 1 tablet (80 mg total) by mouth daily.   guaiFENesin 600 MG 12 hr tablet Commonly known as:  MUCINEX Take 600 mg by mouth 2 (two) times daily.   ipratropium 0.02 % nebulizer solution Commonly known as:  ATROVENT Take 2.5 mLs (0.5 mg total) by nebulization every 6 (six) hours.   levothyroxine 175 MCG tablet Commonly known as:  SYNTHROID, LEVOTHROID TAKE 1 TABLET (175 MCG TOTAL) BY MOUTH DAILY BEFORE BREAKFAST.   loratadine-pseudoephedrine 10-240 MG 24 hr tablet Commonly known as:  CLARITIN-D 24-hour Take 1 tablet by mouth as needed for allergies.   MAGNESIUM DR PO Take 1 capsule by mouth daily.   metFORMIN 500 MG 24 hr tablet Commonly known as:  GLUCOPHAGE-XR TAKE 1 TABLET (500 MG TOTAL) BY MOUTH 4 (FOUR) TIMES DAILY - AFTER MEALS AND AT BEDTIME.   metolazone 5 MG tablet Commonly known as:  ZAROXOLYN Take 1 pill 1 hour before the lasix daily as needed for weight gain/edema.   OVER THE COUNTER MEDICATION Slow-release iron 45 mg 2 tablets daily   OXYGEN Inhale 8 L into the lungs continuous.   polyethylene glycol  packet Commonly known as:  MIRALAX / GLYCOLAX Take 17 g by mouth 2 (two) times daily.   pravastatin 40 MG tablet Commonly known as:  PRAVACHOL TAKE 1 TABLET BY MOUTH EVERY DAY   VITAMIN D PO Take 2,000 Units by mouth daily.

## 2018-01-09 DIAGNOSIS — J9610 Chronic respiratory failure, unspecified whether with hypoxia or hypercapnia: Secondary | ICD-10-CM | POA: Diagnosis not present

## 2018-01-09 DIAGNOSIS — J449 Chronic obstructive pulmonary disease, unspecified: Secondary | ICD-10-CM | POA: Diagnosis not present

## 2018-01-12 ENCOUNTER — Ambulatory Visit (INDEPENDENT_AMBULATORY_CARE_PROVIDER_SITE_OTHER): Payer: PPO | Admitting: Orthopedic Surgery

## 2018-01-12 ENCOUNTER — Other Ambulatory Visit: Payer: Self-pay | Admitting: Physician Assistant

## 2018-01-12 LAB — COMPLETE METABOLIC PANEL WITH GFR
AG RATIO: 1.5 (calc) (ref 1.0–2.5)
ALT: 7 U/L (ref 6–29)
AST: 13 U/L (ref 10–35)
Albumin: 4 g/dL (ref 3.6–5.1)
Alkaline phosphatase (APISO): 74 U/L (ref 33–130)
BUN / CREAT RATIO: 22 (calc) (ref 6–22)
BUN: 25 mg/dL (ref 7–25)
CALCIUM: 9.6 mg/dL (ref 8.6–10.4)
CO2: 37 mmol/L — ABNORMAL HIGH (ref 20–32)
Chloride: 100 mmol/L (ref 98–110)
Creat: 1.16 mg/dL — ABNORMAL HIGH (ref 0.60–0.93)
GFR, EST NON AFRICAN AMERICAN: 47 mL/min/{1.73_m2} — AB (ref >=60)
GFR, Est African American: 55 mL/min/{1.73_m2} — ABNORMAL LOW (ref >=60)
Globulin: 2.6 g/dL (calc) (ref 1.9–3.7)
Glucose, Bld: 117 mg/dL — ABNORMAL HIGH (ref 65–99)
POTASSIUM: 4.2 mmol/L (ref 3.5–5.3)
Sodium: 143 mmol/L (ref 135–146)
TOTAL PROTEIN: 6.6 g/dL (ref 6.1–8.1)
Total Bilirubin: 0.6 mg/dL (ref 0.2–1.2)

## 2018-01-12 LAB — CBC WITH DIFFERENTIAL/PLATELET
Basophils Absolute: 37 cells/uL (ref 0–200)
Basophils Relative: 0.5 %
Eosinophils Absolute: 80 cells/uL (ref 15–500)
Eosinophils Relative: 1.1 %
HEMATOCRIT: 40.2 % (ref 35.0–45.0)
Hemoglobin: 12.3 g/dL (ref 11.7–15.5)
LYMPHS ABS: 825 {cells}/uL — AB (ref 850–3900)
MCH: 24.9 pg — ABNORMAL LOW (ref 27.0–33.0)
MCHC: 30.6 g/dL — ABNORMAL LOW (ref 32.0–36.0)
MCV: 81.5 fL (ref 80.0–100.0)
MPV: 9.4 fL (ref 7.5–12.5)
Monocytes Relative: 7.6 %
NEUTROS ABS: 5804 {cells}/uL (ref 1500–7800)
Neutrophils Relative %: 79.5 %
PLATELETS: 263 10*3/uL (ref 140–400)
RBC: 4.93 10*6/uL (ref 3.80–5.10)
RDW: 17.7 % — ABNORMAL HIGH (ref 11.0–15.0)
TOTAL LYMPHOCYTE: 11.3 %
WBC: 7.3 10*3/uL (ref 3.8–10.8)
WBCMIX: 555 {cells}/uL (ref 200–950)

## 2018-01-15 ENCOUNTER — Encounter: Payer: Self-pay | Admitting: Internal Medicine

## 2018-01-16 DIAGNOSIS — J449 Chronic obstructive pulmonary disease, unspecified: Secondary | ICD-10-CM | POA: Diagnosis not present

## 2018-01-21 ENCOUNTER — Other Ambulatory Visit: Payer: Self-pay

## 2018-01-21 NOTE — Patient Outreach (Signed)
[facility]) Care Management  01/21/2018  SAHARAH SHERROW 1946-03-05 [id number]   TELEPHONE SCREENING Referral date: 01/08/18 Referral source: MD referral:   Referral reason: Referral to palliative care team  Insurance: Health team advantage Attempt  #1  Telephone call to patient regarding  MD referral.  Unable to reach patient. HIPAA compliant voice message left with call back phone number.  PLAN; RNCM will attempt 2nd  telephone call to patient within 4 business days.   RNCM will send patient outreach letter to attempt contact   .   Quinn Plowman RN,BSN,CCM [facility] Telephonic  [phone]

## 2018-01-22 ENCOUNTER — Ambulatory Visit (INDEPENDENT_AMBULATORY_CARE_PROVIDER_SITE_OTHER): Payer: PPO | Admitting: Orthopedic Surgery

## 2018-01-22 DIAGNOSIS — J449 Chronic obstructive pulmonary disease, unspecified: Secondary | ICD-10-CM | POA: Diagnosis not present

## 2018-01-23 ENCOUNTER — Other Ambulatory Visit: Payer: Self-pay | Admitting: Physician Assistant

## 2018-01-26 ENCOUNTER — Ambulatory Visit: Payer: Self-pay

## 2018-01-27 ENCOUNTER — Other Ambulatory Visit: Payer: Self-pay

## 2018-01-27 ENCOUNTER — Other Ambulatory Visit: Payer: Self-pay | Admitting: Internal Medicine

## 2018-01-27 DIAGNOSIS — J439 Emphysema, unspecified: Secondary | ICD-10-CM

## 2018-01-27 NOTE — Patient Outreach (Signed)
[place] [facility]) Care Management  01/27/2018  Jillian Wood 05-11-1946 [id number]  TELEPHONE SCREENING Referral date: 01/08/18 Referral source: MD referral:   Referral reason: Referral to palliative care team  Insurance: Health team advantage  Telephone call to patient regarding primary MD referral. HIPAA verified with patient. Discussed reason for call.  Patient gave verbal authorization to speak with her son, [name].  Son states patient is [facility] come to their home on tomorrow 01/28/18.  Son states he and patient will be talking with Palliative care regarding patients care needs and health care power of attorney.  Son states he will follow up with [facility] by Friday 01/30/18 if [facility] care management services are need. Son states patient received [facility] care management letter in the mail on yesterday. Confirmed contact phone number to [facility] care management.  RNCM name given to son to return call.   PLAN; RNCM will follow up with patient within 4 business days.   Quinn Plowman RN,BSN,CCM [facility] Telephonic  [phone]

## 2018-01-29 ENCOUNTER — Ambulatory Visit (INDEPENDENT_AMBULATORY_CARE_PROVIDER_SITE_OTHER): Payer: PPO | Admitting: Physician Assistant

## 2018-01-29 ENCOUNTER — Encounter: Payer: Self-pay | Admitting: Physician Assistant

## 2018-01-29 VITALS — BP 124/70 | HR 109 | Ht 65.0 in | Wt 231.0 lb

## 2018-01-29 DIAGNOSIS — I484 Atypical atrial flutter: Secondary | ICD-10-CM

## 2018-01-29 DIAGNOSIS — I5032 Chronic diastolic (congestive) heart failure: Secondary | ICD-10-CM

## 2018-01-29 NOTE — Progress Notes (Signed)
Cardiology Office Note   Date:  01/29/2018   ID:  Jillian Wood, DOB 12-Oct-1945, MRN [id number]  PCP:  Unk Pinto, MD  Cardiologist: Dr. Gwenlyn Found, 01/17/2017 Rosaria Ferries, PA-C 06/19/2017  Chief Complaint  Patient presents with  . Follow-up    pt does have SOB does have o2, denies chest pains, no swelling in hands/feet    History of Present Illness: Jillian Wood is a 72 y.o. female with a history of PSVT, CHF, COPD, DM, HTN, HLD, hypothyroid, OSA on CPAP, morbid obesity, atrial flutter on Coumadin, vitamin D deficiency  Admitted 03/29-04/10/2017 for acute on chronic resp failure, diuresed with discharge weight 235 lbs, home O2 at 8 lpm, atrial flutter rate controlled on atenolol and diltiazem, off Coumadin due to history of GI bleed, AKI felt secondary to overdiuresis with creatinine 1.51, discharge creatinine 1.37 4/24 office visit with Dr. Halford Chessman, volume status good, weight 232 pounds, hospice referral made  Jillian Wood presents for cardiology follow up.  They met w/ Hospice and are going with Palliative Care, but have not started yet. The nurses will visit 1-2 x month to monitor and are on call. They will assist in decision making should her condition worsen.  She really does not want to go back to the hospital.  When she is constipated, her weight goes up. She has Miralax to take prn but does not take it regularly. She takes iron pills in addition to the Lasix, so it is not surprising that constipation is a problem.   She is weighing daily. Her weight is staying between 229 and 235. She has taken the metolazone only once in the last 2 weeks. She thinks she drinks between 1 & 2 quarts of liquids daily.   She is not having palpitations.  She is not having chest pain.  She feels her breathing is at baseline although her oxygen need is very high.  She has accepted that her lungs are not going to get any better.   Past Jillian History:  Diagnosis Date  .  Arthritis   . CHF (congestive heart failure) ([place])   . COPD (chronic obstructive pulmonary disease) (Jillian Wood)   . Depression   . Diabetes mellitus type 2 in obese (Jillian Wood)   . GERD (gastroesophageal reflux disease)   . Hyperlipidemia   . Hypertension   . Morbid obesity (Jillian Wood)   . OAB (overactive bladder)   . PSVT (paroxysmal supraventricular tachycardia) (Jillian Wood)   . Thyroid disease   . Vitamin D deficiency     Past Surgical History:  Procedure Laterality Date  . CARPAL TUNNEL RELEASE     L 1992 R 1984  . COLONOSCOPY N/A 08/19/2017   Procedure: COLONOSCOPY;  Surgeon: Milus Banister, MD;  [facility] END[facility];  Service: Endoscopy;  Laterality: N/A;  . EYE SURGERY Bilateral    cataract  . NM MYOVIEW LTD  01/2011   Dobutamine Myoview: Negative perfusion scan for ischemia / infarct (poor image capture); Pt developed SVT with Dobutamine that reproduced her CP as it resolved with restoration of NSR.  Marland Kitchen PUBOVAGINAL SLING    . TEE WITHOUT CARDIOVERSION N/A 12/16/2016   Procedure: TRANSESOPHAGEAL ECHOCARDIOGRAM (TEE);  Surgeon: Sanda Klein, MD;  Location: [facility] ENDOSCOPY;  Service: Cardiovascular;  Laterality: N/A;  . TONSILLECTOMY AND ADENOIDECTOMY    . TRANSTHORACIC ECHOCARDIOGRAM  01/2011   Hyperdynamic LV with EF 65-70%, Gr 1 DD, Mild Ao Stenosis - mean gradient ~19 mmHg    Current Outpatient Medications  Medication Sig Dispense Refill  . acetaminophen-codeine (TYLENOL #3) 300-30 MG tablet Take 1 tablet by mouth every 8 (eight) hours as needed for moderate pain or severe pain. 21 tablet 0  . acetaZOLAMIDE (DIAMOX) 250 MG tablet TAKE 1 TABLET (250 MG TOTAL) BY MOUTH 3 (THREE) TIMES DAILY. 270 tablet 1  . albuterol (PROVENTIL HFA;VENTOLIN HFA) 108 (90 Base) MCG/ACT inhaler Inhale 1-2 puffs into the lungs every 6 (six) hours as needed for wheezing or shortness of breath. 18 g 2  . albuterol (PROVENTIL) (2.5 MG/3ML) 0.083% nebulizer solution Take 3 mLs (2.5 mg total) by nebulization every 6 (six)  hours as needed for wheezing or shortness of breath. 75 mL 12  . aspirin 81 MG tablet Take 81 mg by mouth daily.    Marland Kitchen atenolol (TENORMIN) 50 MG tablet TAKE 1 TABLET BY MOUTH EVERY DAY 90 tablet 0  . budesonide (PULMICORT) 0.5 MG/2ML nebulizer solution Take 2 mLs (0.5 mg total) by nebulization 2 (two) times daily. 360 mL 3  . Cholecalciferol (VITAMIN D PO) Take 2,000 Units by mouth daily.     . diazepam (VALIUM) 5 MG tablet Take one tablet up to 2 x a daily for anxiety AS NEEDED 60 tablet 0  . diltiazem (CARDIZEM CD) 300 MG 24 hr capsule TAKE 1 CAPSULE (300 MG TOTAL) BY MOUTH DAILY. 90 capsule 1  . DULoxetine (CYMBALTA) 60 MG capsule TAKE 1 CAPSULE BY MOUTH DAILY (Patient taking differently: TAKE 60 mg  CAPSULE BY MOUTH DAILY) 90 capsule 1  . famotidine (PEPCID) 40 MG tablet Take 40 mg by mouth 2 (two) times daily.    . fluticasone (FLONASE) 50 MCG/ACT nasal spray Place 2 sprays into both nostrils daily. (Patient taking differently: Place 2 sprays into both nostrils daily as needed for allergies. ) 16 g 0  . furosemide (LASIX) 80 MG tablet Take 1 tablet (80 mg total) by mouth daily. 90 tablet 0  . guaiFENesin (MUCINEX) 600 MG 12 hr tablet Take 600 mg by mouth 2 (two) times daily.     Marland Kitchen ipratropium (ATROVENT) 0.02 % nebulizer solution Take 2.5 mLs (0.5 mg total) by nebulization every 6 (six) hours. 75 mL 12  . levothyroxine (SYNTHROID, LEVOTHROID) 175 MCG tablet TAKE 1 TABLET (175 MCG TOTAL) BY MOUTH DAILY BEFORE BREAKFAST. 90 tablet 1  . loratadine-pseudoephedrine (CLARITIN-D 24-HOUR) 10-240 MG per 24 hr tablet Take 1 tablet by mouth as needed for allergies.     . Magnesium Chloride (MAGNESIUM DR PO) Take 1 capsule by mouth daily.     . metFORMIN (GLUCOPHAGE-XR) 500 MG 24 hr tablet TAKE 1 TABLET (500 MG TOTAL) BY MOUTH 4 (FOUR) TIMES DAILY - AFTER MEALS AND AT BEDTIME. 360 tablet 1  . metolazone (ZAROXOLYN) 5 MG tablet TAKE 1 PILL 1 HOUR BEFORE THE LASIX DAILY AS NEEDED FOR WEIGHT GAIN/EDEMA. 90  tablet 1  . OVER THE COUNTER MEDICATION Slow-release iron 45 mg 2 tablets daily    . OXYGEN-HELIUM IN Inhale 8 L into the lungs continuous.     . polyethylene glycol (MIRALAX / GLYCOLAX) packet Take 17 g by mouth 2 (two) times daily. 14 each 0  . pravastatin (PRAVACHOL) 40 MG tablet TAKE 1 TABLET BY MOUTH EVERY DAY (Patient taking differently: TAKE 40 mg TABLET BY MOUTH EVERY DAY) 90 tablet 1   No current facility-administered medications for this visit.     Allergies:   Inderal [propranolol] and Ace inhibitors    Social History:  The patient  reports that she  quit smoking about 17 months ago. Her smoking use included cigarettes. She has a 22.00 pack-year smoking history. She has never used smokeless tobacco. She reports that she does not drink alcohol or use drugs.   Family History:  The patient's family history includes Alcohol abuse in her father; Heart disease in her father.    ROS:  Please see the history of present illness. All other systems are reviewed and negative.    PHYSICAL EXAM: VS:  BP 124/70 (BP Location: Left Arm)   Pulse (!) 109   Ht '5\' 5"'$  (1.651 m)   Wt 231 lb (104.8 kg)   BMI 38.44 kg/m  , BMI Body mass index is 38.44 kg/m. GEN: Well nourished, well developed, female in no acute distress  HEENT: normal for age  Neck: no JVD seen but difficult to assess secondary to body habitus, no carotid bruit, no masses Cardiac: Irregular rate and rhythm; soft murmur, no rubs, or gallops Respiratory:  clear to auscultation bilaterally, normal work of breathing GI: soft, nontender, nondistended, + BS MS: no deformity or atrophy; trace lower extremity edema; distal pulses are 2+ in all 4 extremities   Skin: warm and dry, no rash Neuro:  Strength and sensation are intact Psych: euthymic mood, full affect   EKG:  EKG is not ordered today.   Echo 08/14/2017 - Left ventricle: The cavity size was normal. Wall thickness was normal. Systolic function was normal. The  estimated ejection fraction was in the range of 55% to 60%. - Mitral valve: Calcified annulus. Mildly thickened leaflets . There was mild regurgitation. - Left atrium: The atrium was severely dilated. - Right ventricle: The cavity size was mildly dilated. Systolic function was mildly to moderately reduced. - Right atrium: The atrium was severely dilated.     Recent Labs: 11/10/2017: TSH 4.48 12/11/2017: Magnesium 1.7 12/12/2017: B Natriuretic Peptide 978.6 01/06/2018: ALT 7; BUN 25; Creat 1.16; Hemoglobin 12.3; Platelets 263; Potassium 4.2; Sodium 143    Lipid Panel    Component Value Date/Time   CHOL 144 11/10/2017 1620   TRIG 127 11/10/2017 1620   HDL 47 (L) 11/10/2017 1620   CHOLHDL 3.1 11/10/2017 1620   VLDL 38 (H) 02/11/2017 1422   LDLCALC 76 11/10/2017 1620     Wt Readings from Last 3 Encounters:  01/29/18 231 lb (104.8 kg)  01/07/18 232 lb 6.4 oz (105.4 kg)  01/06/18 234 lb 9.6 oz (106.4 kg)     Other studies Reviewed: Additional studies/ records that were reviewed today include: Office notes, hospital records and testing.  ASSESSMENT AND PLAN:  1.  Persistent atrial fibrillation/flutter: Her heart rate is generally controlled and she is asymptomatic.  Continue atenolol and Cardizem.  2.  Chronic anticoagulation: She is now on aspirin 81 mg daily.  Coumadin was stopped by Dr. Melford Aase 09/02/2017 because of her history of GI bleed and poor prognosis.  3. Chronic diastolic CHF: Her weight is generally stable, within a 4 pound range.  She is much more compliant with medications and a low-sodium diet.  We discussed strategies to make sure she gets adequate hydration without over drinking.  Continue current therapy.  She has a physical coming up with her PCP soon, hopefully she can get a BMET at that time.   Current medicines are reviewed at length with the patient today.  The patient does not have concerns regarding medicines.  The following changes have been  made:  no change  Labs/ tests ordered today include:  No orders  of the defined types were placed in this encounter.    Disposition:   FU with Dr Gwenlyn Found in 2 months, ok to move appt out further if doing well.   Jonetta Speak, PA-C  01/29/2018 5:30 PM    [place] Phone: [phone]; Fax: [phone]  This note was written with the assistance of speech recognition software. Please excuse any transcriptional errors.

## 2018-01-29 NOTE — Patient Instructions (Signed)
Medication Instructions:  Your physician recommends that you continue on your current medications as directed. Please refer to the Current Medication list given to you today.  Labwork: Will complete at Dr Idell Pickles office- Please do a BMET  Testing/Procedures: None   Follow-Up: Your physician recommends that you schedule a follow-up appointment in: 2 months with Dr Gwenlyn Found.  Any Other Special Instructions Will Be Listed Below (If Applicable). 1. Check your weight Daily 2. Drink 1-2 quarts of fluids a day 3. Eat ONLY 500mg  or Less of sodium per meal If you need a refill on your cardiac medications before your next appointment, please call your pharmacy.

## 2018-02-02 ENCOUNTER — Other Ambulatory Visit: Payer: Self-pay

## 2018-02-02 NOTE — Patient Outreach (Signed)
[facility]) Care Management  02/02/2018  Jillian Wood March 03, 1946 [id number]   TELEPHONE SCREENING Referral date:01/08/18 Referral source:MD referral: Referral reason:Referral to palliative care team Insurance:Health team advantage   Telephone call to patient's son, Jillian Wood.  HIPAA verified for patient by son.  Son states patient signed up with care connections and would not need [facility] care management services at this time.  Informed son RNCM mailed information to patient regarding [facility] care management services. Advised to contact [facility] in the event services are needed in the future. Son verbalized understanding.   PLAN; RNCM will close patient due to patient being enrolled in an external program. RNCM will send closure notification to patients primary MD.   Quinn Plowman RN,BSN,CCM [facility] Telephonic  [phone]

## 2018-02-04 DIAGNOSIS — M5440 Lumbago with sciatica, unspecified side: Secondary | ICD-10-CM | POA: Diagnosis not present

## 2018-02-04 DIAGNOSIS — J449 Chronic obstructive pulmonary disease, unspecified: Secondary | ICD-10-CM | POA: Diagnosis not present

## 2018-02-04 DIAGNOSIS — R29898 Other symptoms and signs involving the musculoskeletal system: Secondary | ICD-10-CM | POA: Diagnosis not present

## 2018-02-08 DIAGNOSIS — J9610 Chronic respiratory failure, unspecified whether with hypoxia or hypercapnia: Secondary | ICD-10-CM | POA: Diagnosis not present

## 2018-02-08 DIAGNOSIS — J449 Chronic obstructive pulmonary disease, unspecified: Secondary | ICD-10-CM | POA: Diagnosis not present

## 2018-02-11 ENCOUNTER — Ambulatory Visit (INDEPENDENT_AMBULATORY_CARE_PROVIDER_SITE_OTHER): Payer: PPO | Admitting: Internal Medicine

## 2018-02-11 VITALS — BP 108/72 | HR 92 | Temp 97.6°F | Resp 24 | Ht 65.0 in | Wt 232.4 lb

## 2018-02-11 DIAGNOSIS — J9612 Chronic respiratory failure with hypercapnia: Secondary | ICD-10-CM

## 2018-02-11 DIAGNOSIS — K219 Gastro-esophageal reflux disease without esophagitis: Secondary | ICD-10-CM

## 2018-02-11 DIAGNOSIS — Z87891 Personal history of nicotine dependence: Secondary | ICD-10-CM

## 2018-02-11 DIAGNOSIS — I10 Essential (primary) hypertension: Secondary | ICD-10-CM

## 2018-02-11 DIAGNOSIS — Z136 Encounter for screening for cardiovascular disorders: Secondary | ICD-10-CM | POA: Diagnosis not present

## 2018-02-11 DIAGNOSIS — Z8249 Family history of ischemic heart disease and other diseases of the circulatory system: Secondary | ICD-10-CM

## 2018-02-11 DIAGNOSIS — Z0001 Encounter for general adult medical examination with abnormal findings: Secondary | ICD-10-CM

## 2018-02-11 DIAGNOSIS — E1122 Type 2 diabetes mellitus with diabetic chronic kidney disease: Secondary | ICD-10-CM

## 2018-02-11 DIAGNOSIS — E782 Mixed hyperlipidemia: Secondary | ICD-10-CM | POA: Diagnosis not present

## 2018-02-11 DIAGNOSIS — E559 Vitamin D deficiency, unspecified: Secondary | ICD-10-CM

## 2018-02-11 DIAGNOSIS — Z0000 Encounter for general adult medical examination without abnormal findings: Secondary | ICD-10-CM

## 2018-02-11 DIAGNOSIS — N183 Chronic kidney disease, stage 3 unspecified: Secondary | ICD-10-CM

## 2018-02-11 DIAGNOSIS — I5032 Chronic diastolic (congestive) heart failure: Secondary | ICD-10-CM

## 2018-02-11 DIAGNOSIS — I272 Pulmonary hypertension, unspecified: Secondary | ICD-10-CM

## 2018-02-11 DIAGNOSIS — Z79899 Other long term (current) drug therapy: Secondary | ICD-10-CM

## 2018-02-11 DIAGNOSIS — E039 Hypothyroidism, unspecified: Secondary | ICD-10-CM

## 2018-02-11 DIAGNOSIS — J449 Chronic obstructive pulmonary disease, unspecified: Secondary | ICD-10-CM

## 2018-02-11 DIAGNOSIS — Z1212 Encounter for screening for malignant neoplasm of rectum: Secondary | ICD-10-CM

## 2018-02-11 DIAGNOSIS — Z1211 Encounter for screening for malignant neoplasm of colon: Secondary | ICD-10-CM

## 2018-02-11 NOTE — Patient Instructions (Signed)

## 2018-02-11 NOTE — Progress Notes (Signed)
[facility] Unk Pinto, M.D.     Uvaldo Bristle. Silverio Lay, P.A.-C Liane Comber, [place] [address] [place], [place] [address] Telephone [phone] Telefax [phone] Annual Screening/Preventative Visit & Comprehensive Evaluation &  Examination     This very nice 72 y.o. [facility] presents for a Screening/Preventative Visit & comprehensive evaluation and management of multiple medical co-morbidities.  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 She has GERD controlled w/ her meds.      Patient also has Endstage COPD and has finally agreed to be followed by Palliative care.  Her COPD is severe with O2 requirement up to 6-8 L/min to maintain O2 sat's in the low 90's. She also hs CO2 retention and is on Diamox.       HTN predates circa 1980. Patient's BP has been controlled at home and patient denies any cardiac symptoms as chest pain, palpitations, shortness of breath, dizziness or ankle swelling.  Patient was dx'd with Afib in Nov 2017 and started Coumadin.  Patient's EKG's since then have intermittently shown Aflutter with variable block.  Patient has been dx'd with chronic Diastolic Heart Failure consequent of Pulmonary Htn. Today's BP is at goal -  108/72.     Patient's hyperlipidemia is controlled with diet and medications. Patient denies myalgias or other medication SE's. Last lipids were at goal: Lab Results  Component Value Date   CHOL 145 02/11/2018   HDL 49 (L) 02/11/2018   LDLCALC 75 02/11/2018   TRIG 126 02/11/2018   CHOLHDL 3.0 02/11/2018      Patient has Morbid Obesity (BMI 38+) and T2_NIDDM (2011) w/CKD3 predating and patient denies reactive hypoglycemic symptoms, visual blurring, diabetic polys, or paresthesias. Last A1c was  Lab Results  Component Value Date   HGBA1C 6.1 (H) 02/11/2018      Patient has been on Thyroid Replacement circa 1980's.       Finally, patient has history of Vitamin D Deficiency ("9"/2009) and last Vitamin D was at goal: Lab Results  Component Value Date   VD25OH 61 02/11/2018   Current Outpatient Medications on File Prior to Visit  Medication Sig  . acetaminophen-codeine (TYLENOL #3) 300-30 MG tablet Take 1 tablet by mouth every 8 (eight) hours as needed for moderate pain or severe pain.  Marland Kitchen acetaZOLAMIDE (DIAMOX) 250 MG tablet TAKE 1 TABLET (250 MG TOTAL) BY MOUTH 3 (THREE) TIMES DAILY.  Marland Kitchen albuterol (PROVENTIL HFA;VENTOLIN HFA) 108 (90 Base) MCG/ACT inhaler Inhale 1-2 puffs into the lungs every 6 (six) hours as needed for wheezing or shortness of breath.  Marland Kitchen albuterol (PROVENTIL) (2.5 MG/3ML) 0.083% nebulizer solution Take 3 mLs (2.5 mg total) by nebulization every 6 (six) hours as needed for wheezing or shortness of breath.  Marland Kitchen aspirin 81 MG tablet Take 81 mg by mouth daily.  Marland Kitchen atenolol (TENORMIN) 50 MG tablet TAKE 1 TABLET BY MOUTH EVERY DAY  . budesonide (PULMICORT) 0.5 MG/2ML nebulizer solution Take 2 mLs (0.5 mg total) by nebulization 2 (two) times daily.  . Cholecalciferol (VITAMIN D PO) Take 2,000 Units by mouth daily.   . diazepam (VALIUM) 5 MG tablet Take one tablet up to 2 x a daily for anxiety AS NEEDED  . diltiazem (CARDIZEM CD) 300 MG 24 hr capsule TAKE 1 CAPSULE (300 MG TOTAL) BY MOUTH DAILY.  . DULoxetine (CYMBALTA) 60 MG capsule TAKE 1 CAPSULE BY MOUTH DAILY (Patient taking differently: TAKE 60 mg  CAPSULE BY  MOUTH DAILY)  . famotidine (PEPCID) 40 MG tablet Take 40 mg by mouth 2 (two) times daily.  . fluticasone (FLONASE) 50 MCG/ACT nasal spray Place 2 sprays into both nostrils daily. (Patient taking differently: Place 2 sprays into both nostrils daily as needed for allergies. )  . furosemide (LASIX) 80 MG tablet Take 1 tablet (80 mg total) by mouth daily.  Marland Kitchen guaiFENesin (MUCINEX) 600 MG 12 hr tablet Take 600 mg by mouth 2 (two) times daily.   Marland Kitchen ipratropium (ATROVENT) 0.02 % nebulizer solution  Take 2.5 mLs (0.5 mg total) by nebulization every 6 (six) hours.  Marland Kitchen levothyroxine (SYNTHROID, LEVOTHROID) 175 MCG tablet TAKE 1 TABLET (175 MCG TOTAL) BY MOUTH DAILY BEFORE BREAKFAST.  Marland Kitchen loratadine-pseudoephedrine (CLARITIN-D 24-HOUR) 10-240 MG per 24 hr tablet Take 1 tablet by mouth as needed for allergies.   . Magnesium Chloride (MAGNESIUM DR PO) Take 1 capsule by mouth daily.   . metFORMIN (GLUCOPHAGE-XR) 500 MG 24 hr tablet TAKE 1 TABLET (500 MG TOTAL) BY MOUTH 4 (FOUR) TIMES DAILY - AFTER MEALS AND AT BEDTIME.  . metolazone (ZAROXOLYN) 5 MG tablet TAKE 1 PILL 1 HOUR BEFORE THE LASIX DAILY AS NEEDED FOR WEIGHT GAIN/EDEMA.  Marland Kitchen OVER THE COUNTER MEDICATION Slow-release iron 45 mg 2 tablets daily  . OXYGEN-HELIUM IN Inhale 8 L into the lungs continuous.   . polyethylene glycol (MIRALAX / GLYCOLAX) packet Take 17 g by mouth 2 (two) times daily.  . pravastatin (PRAVACHOL) 40 MG tablet TAKE 1 TABLET BY MOUTH EVERY DAY (Patient taking differently: TAKE 40 mg TABLET BY MOUTH EVERY DAY)   No current facility-administered medications on file prior to visit.    Allergies  Allergen Reactions  . Inderal [Propranolol] Other (See Comments)    Hair loss  . Ace Inhibitors Cough   Past Medical History:  Diagnosis Date  . Arthritis   . CHF (congestive heart failure) ([place])   . COPD (chronic obstructive pulmonary disease) ([place])   . Depression   . Diabetes mellitus type 2 in obese ([place])   . GERD (gastroesophageal reflux disease)   . Hyperlipidemia   . Hypertension   . Morbid obesity ([place])   . OAB (overactive bladder)   . PSVT (paroxysmal supraventricular tachycardia) ([place])   . Thyroid disease   . Vitamin D deficiency    Health Maintenance  Topic Date Due  . OPHTHALMOLOGY EXAM  02/10/2015  . MAMMOGRAM  10/07/2015  . URINE MICROALBUMIN  07/26/2016  . Hepatitis C Screening  09/16/2018 (Originally 1946/01/28)  . INFLUENZA VACCINE  04/16/2018  . HEMOGLOBIN A1C  08/14/2018  . TETANUS/TDAP  08/16/2018   . FOOT EXAM  02/12/2019  . COLONOSCOPY  08/20/2027  . DEXA SCAN  Completed  . PNA vac Low Risk Adult  Completed   Immunization History  Administered Date(s) Administered  . Influenza, High Dose Seasonal PF 08/16/2014, 07/27/2015, 06/06/2016, 08/13/2017  . Pneumococcal Conjugate-13 07/27/2015  . Pneumococcal-Unspecified 05/16/2011  . Tdap 08/16/2008   Last Colon -  Last MGM -  Past Surgical History:  Procedure Laterality Date  . CARPAL TUNNEL RELEASE     L 1992 R 1984  . COLONOSCOPY N/A 08/19/2017   Procedure: COLONOSCOPY;  Surgeon: Milus Banister, MD;  Location: [facility] ENDOSCOPY;  Service: Endoscopy;  Laterality: N/A;  . EYE SURGERY Bilateral    cataract  . NM MYOVIEW LTD  01/2011   Dobutamine Myoview: Negative perfusion scan for ischemia / infarct (poor image capture); Pt developed SVT with Dobutamine that reproduced her  CP as it resolved with restoration of NSR.  Marland Kitchen PUBOVAGINAL SLING    . TEE WITHOUT CARDIOVERSION N/A 12/16/2016   Procedure: TRANSESOPHAGEAL ECHOCARDIOGRAM (TEE);  Surgeon: Sanda Klein, MD;  Location[facility] ENDOSCOPY;  Service: Cardiovascular;  Laterality: N/A;  . TONSILLECTOMY AND ADENOIDECTOMY    . TRANSTHORACIC ECHOCARDIOGRAM  01/2011   Hyperdynamic LV with EF 65-70%, Gr 1 DD, Mild Ao Stenosis - mean gradient ~19 mmHg   Family History  Problem Relation Age of Onset  . Alcohol abuse Father   . Heart disease Father    Social History   Tobacco Use  . Smoking status: Former Smoker    Packs/day: 0.50    Years: 44.00    Pack years: 22.00    Types: Cigarettes    Last attempt to quit: 07/31/2016    Years since quitting: 1.5  . Smokeless tobacco: Never Used  Substance Use Topics  . Alcohol use: No    Alcohol/week: 0.0 oz  . Drug use: No    ROS Constitutional: Denies fever, chills, weight loss/gain, headaches, insomnia,  night sweats, and change in appetite. Does c/o fatigue. Eyes: Denies redness, blurred vision, diplopia, discharge, itchy, watery eyes.   ENT: Denies discharge, congestion, post nasal drip, epistaxis, sore throat, earache, hearing loss, dental pain, Tinnitus, Vertigo, Sinus pain, snoring.  Cardio: Denies chest pain, palpitations, irregular heartbeat, syncope, dyspnea, diaphoresis, orthopnea, PND, claudication, edema Respiratory: denies cough, pleurisy, hoarseness, laryngitis, wheezing.  Has chronic dyspnea. Gastrointestinal: Denies dysphagia, heartburn, reflux, water brash, pain, cramps, nausea, vomiting, bloating, diarrhea, constipation, hematemesis, melena, hematochezia, jaundice, hemorrhoids Genitourinary: Denies dysuria, frequency, urgency, nocturia, hesitancy, discharge, hematuria, flank pain Breast: Breast lumps, nipple discharge, bleeding.  Musculoskeletal: Denies arthralgia, myalgia, stiffness, Jt. Swelling, pain, limp, and strain/sprain. Denies falls. Skin: Denies puritis, rash, hives, warts, acne, eczema, changing in skin lesion Neuro: No weakness, tremor, incoordination, spasms, paresthesia, pain Psychiatric: Denies confusion, memory loss, sensory loss. Denies Depression. Endocrine: Denies change in weight, skin, hair change, nocturia, and paresthesia, diabetic polys, visual blurring, hyper / hypo glycemic episodes.  Heme/Lymph: No excessive bleeding, bruising, enlarged lymph nodes.  Physical Exam  BP 108/72   Pulse 92   Temp 97.6 F (36.4 C)   Resp (!) 24   Ht 5\' 5"  (1.651 m)   Wt 232 lb 6.4 oz (105.4 kg)   SpO2 (!) 80%   BMI 38.67 kg/m   General Appearance: Well nourished, well groomed and in no apparent distress.  Eyes: PERRLA, EOMs, conjunctiva no swelling or erythema, normal fundi and vessels. Sinuses: No frontal/maxillary tenderness ENT/Mouth: EACs patent / TMs  nl. Nares clear without erythema, swelling, mucoid exudates. Oral hygiene is good. No erythema, swelling, or exudate. Tongue normal, non-obstructing. Tonsils not swollen or erythematous. Hearing normal.  Neck: Supple, thyroid not palpable. No  bruits, nodes or JVD. Respiratory: Respiratory effort normal.  BS equal and clear bilateral without rales, rhonci, wheezing or stridor. Cardio: Heart sounds are normal with regular rate and rhythm and no murmurs, rubs or gallops. Peripheral pulses are normal and equal bilaterally without edema. No aortic or femoral bruits. Chest: symmetric with normal excursions and percussion. Breasts: Symmetric, without lumps, nipple discharge, retractions, or fibrocystic changes.  Abdomen: Flat, soft with bowel sounds active. Nontender, no guarding, rebound, hernias, masses, or organomegaly.  Lymphatics: Non tender without lymphadenopathy.  Genitourinary:  Musculoskeletal: Full ROM all peripheral extremities, joint stability, 5/5 strength, and normal gait. Skin: Warm and dry without rashes, lesions, cyanosis, clubbing or  ecchymosis.  Neuro: Cranial nerves intact,  reflexes equal bilaterally. Normal muscle tone, no cerebellar symptoms. Sensation intact.  Pysch: Alert and oriented X 3, normal affect, Insight and Judgment appropriate.   Assessment and Plan  1. Annual Preventative Screening Examination  2. Essential hypertension  - EKG 12-Lead - Urinalysis, Routine w reflex microscopic - CBC with Differential/Platelet - COMPLETE METABOLIC PANEL WITH GFR - Magnesium  3. Hyperlipidemia, mixed  - EKG 12-Lead - Urinalysis, Routine w reflex microscopic - Lipid panel - TSH - Microalbumin / creatinine urine ratio  4. Type 2 diabetes mellitus with stage 3 chronic kidney disease, without long-term current use of insulin (HCC)  - EKG 12-Lead - Urinalysis, Routine w reflex microscopic - HM DIABETES FOOT EXAM - LOW EXTREMITY NEUR EXAM DOCUM - Hemoglobin A1c - Insulin, random  5. Vitamin D deficiency  - VITAMIN D 25 Hydroxyl - Microalbumin / creatinine urine ratio  6. Hypothyroidism  - TSH  7. Chronic respiratory failure with hypercapnia (HCC)  - COMPLETE METABOLIC PANEL WITH GFR  8. Chronic  diastolic congestive heart failure ([place])  9. Chronic obstructive pulmonary disease ([place])  10. Gastroesophageal reflux disease without esophagitis  - CBC with Differential/Platelet  11. Pulmonary hypertension (HCC)  - EKG 12-Lead  12. Screening for ischemic heart disease  - EKG 12-Lead - Urinalysis, Routine w reflex microscopic  13. FHx: heart disease  - EKG 12-Lead - Urinalysis, Routine w reflex microscopic  14. Former smoker  - EKG 12-Lead - Urinalysis, Routine w reflex microscopic  15. Medication management  - CBC with Differential/Platelet - COMPLETE METABOLIC PANEL WITH GFR - Magnesium - Lipid panel - TSH - Hemoglobin A1c - Insulin, random - VITAMIN D 25 Hydroxyl  16. Screening for colorectal cancer  - POC Hemoccult Bld/Stl       Patient was counseled in prudent diet to achieve/maintain BMI less than 25 for weight control, BP monitoring, regular exercise and medications. Discussed med's effects and SE's. Screening labs and tests as requested with regular follow-up as recommended. Over 40 minutes of exam, counseling, chart review and high complex critical decision making was performed.

## 2018-02-12 ENCOUNTER — Ambulatory Visit (INDEPENDENT_AMBULATORY_CARE_PROVIDER_SITE_OTHER): Payer: PPO | Admitting: Orthopedic Surgery

## 2018-02-12 ENCOUNTER — Ambulatory Visit (INDEPENDENT_AMBULATORY_CARE_PROVIDER_SITE_OTHER): Payer: PPO

## 2018-02-12 ENCOUNTER — Encounter (INDEPENDENT_AMBULATORY_CARE_PROVIDER_SITE_OTHER): Payer: Self-pay | Admitting: Orthopedic Surgery

## 2018-02-12 DIAGNOSIS — M79641 Pain in right hand: Secondary | ICD-10-CM

## 2018-02-12 DIAGNOSIS — M79642 Pain in left hand: Secondary | ICD-10-CM

## 2018-02-12 DIAGNOSIS — M25511 Pain in right shoulder: Secondary | ICD-10-CM

## 2018-02-12 LAB — COMPLETE METABOLIC PANEL WITH GFR
AG Ratio: 1.6 (calc) (ref 1.0–2.5)
ALBUMIN MSPROF: 4.1 g/dL (ref 3.6–5.1)
ALT: 9 U/L (ref 6–29)
AST: 14 U/L (ref 10–35)
Alkaline phosphatase (APISO): 69 U/L (ref 33–130)
BUN / CREAT RATIO: 26 (calc) — AB (ref 6–22)
BUN: 27 mg/dL — AB (ref 7–25)
CALCIUM: 9.6 mg/dL (ref 8.6–10.4)
CO2: 29 mmol/L (ref 20–32)
Chloride: 102 mmol/L (ref 98–110)
Creat: 1.02 mg/dL — ABNORMAL HIGH (ref 0.60–0.93)
GFR, EST NON AFRICAN AMERICAN: 55 mL/min/{1.73_m2} — AB (ref >=60)
GFR, Est African American: 64 mL/min/{1.73_m2} (ref >=60)
GLOBULIN: 2.5 g/dL (ref 1.9–3.7)
GLUCOSE: 130 mg/dL — AB (ref 65–99)
Potassium: 4.8 mmol/L (ref 3.5–5.3)
SODIUM: 142 mmol/L (ref 135–146)
TOTAL PROTEIN: 6.6 g/dL (ref 6.1–8.1)
Total Bilirubin: 0.6 mg/dL (ref 0.2–1.2)

## 2018-02-12 LAB — VITAMIN D 25 HYDROXY (VIT D DEFICIENCY, FRACTURES): VIT D 25 HYDROXY: 61 ng/mL (ref 30–100)

## 2018-02-12 LAB — LIPID PANEL
CHOL/HDL RATIO: 3 (calc) (ref <5.0)
Cholesterol: 145 mg/dL (ref <200)
HDL: 49 mg/dL — ABNORMAL LOW (ref >50)
LDL CHOLESTEROL (CALC): 75 mg/dL
Non-HDL Cholesterol (Calc): 96 mg/dL (calc) (ref <130)
TRIGLYCERIDES: 126 mg/dL (ref <150)

## 2018-02-12 LAB — CBC WITH DIFFERENTIAL/PLATELET
BASOS ABS: 21 {cells}/uL (ref 0–200)
BASOS PCT: 0.3 %
EOS ABS: 48 {cells}/uL (ref 15–500)
Eosinophils Relative: 0.7 %
HCT: 40.6 % (ref 35.0–45.0)
Hemoglobin: 12.4 g/dL (ref 11.7–15.5)
Lymphs Abs: 745 cells/uL — ABNORMAL LOW (ref 850–3900)
MCH: 25.7 pg — AB (ref 27.0–33.0)
MCHC: 30.5 g/dL — AB (ref 32.0–36.0)
MCV: 84.1 fL (ref 80.0–100.0)
MPV: 10.1 fL (ref 7.5–12.5)
Monocytes Relative: 7.1 %
NEUTROS PCT: 81.1 %
Neutro Abs: 5596 cells/uL (ref 1500–7800)
PLATELETS: 294 10*3/uL (ref 140–400)
RBC: 4.83 10*6/uL (ref 3.80–5.10)
RDW: 16.4 % — ABNORMAL HIGH (ref 11.0–15.0)
TOTAL LYMPHOCYTE: 10.8 %
WBC: 6.9 10*3/uL (ref 3.8–10.8)
WBCMIX: 490 {cells}/uL (ref 200–950)

## 2018-02-12 LAB — MAGNESIUM: Magnesium: 1.8 mg/dL (ref 1.5–2.5)

## 2018-02-12 LAB — HEMOGLOBIN A1C
HEMOGLOBIN A1C: 6.1 %{Hb} — AB (ref <5.7)
MEAN PLASMA GLUCOSE: 128 (calc)
eAG (mmol/L): 7.1 (calc)

## 2018-02-12 LAB — INSULIN, RANDOM: Insulin: 5.9 u[IU]/mL (ref 2.0–19.6)

## 2018-02-12 LAB — TSH: TSH: 3.03 mIU/L (ref 0.40–4.50)

## 2018-02-14 ENCOUNTER — Encounter: Payer: Self-pay | Admitting: Internal Medicine

## 2018-02-14 ENCOUNTER — Encounter (INDEPENDENT_AMBULATORY_CARE_PROVIDER_SITE_OTHER): Payer: Self-pay | Admitting: Orthopedic Surgery

## 2018-02-14 NOTE — Progress Notes (Signed)
Office Visit Note   Patient: Jillian Wood           Date of Birth: 02/17/1946           MRN: [id number] Visit Date: 02/12/2018 Requested by: Unk Pinto, [address] [place], [place] [address] PCP: Unk Pinto, MD  Subjective: Chief Complaint  Patient presents with  . Right Shoulder - Pain    HPI: Jillian Wood is a patient with chronic shoulder pain on the right-hand side.  She is right-hand dominant.  She states "I have a history of degenerative bone".  Referred here by her primary care provider.  She is a former patient of Dr. 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  At one time she was taking oxycodone 60 mg 4 times a day.  Currently she is taking Tylenol arthritis along with some Tylenol 3.  She is unable to take heavy duty narcotics due to her breathing condition.  She is here with her son who appears knowledgeable and dedicated to her care.              ROS: All systems reviewed are negative as they relate to the chief complaint within the history of present illness.  Patient denies  fevers or chills.   Assessment & Plan: Visit Diagnoses:  1. Right shoulder pain, unspecified chronicity   2. Bilateral hand pain     Plan: Impression is degenerative joint problems in multiple locations in a patient who has very severe emphysema and who is on oxygen.  This is good to be a management problem as it does not appear that she would be able to tolerate surgery very well in an elective fashion.  She was going to have her records transferred here.  She is diabetic.  Radiographs of the right shoulder do show moderate glenohumeral arthritis.  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  I like to inject the shoulder today and then come back in 10 days for knee radiographs and bilateral hand injections under fluoroscopy.  We would do that to the [facility] joint  Follow-Up Instructions: Return in about 10  days (around 02/22/2018).   Orders:  Orders Placed This Encounter  Procedures  . XR Shoulder Right  . XR Hand Complete Right  . XR Hand Complete Left   No orders of the defined types were placed in this encounter.     Procedures: No procedures performed   Clinical Data: No additional findings.  Objective: Vital Signs: There were no vitals taken for this visit.  Physical Exam:   Constitutional: Patient appears well-developed HEENT:  Head: Normocephalic Eyes:EOM are normal Neck: Normal range of motion Cardiovascular: Normal rate Pulmonary/chest: Effort normal Neurologic: Patient is alert Skin: Skin is warm Psychiatric: Patient has normal mood and affect    Ortho Exam: Orthopedic exam demonstrates patient is in a wheelchair.  She does have some pain with passive and active range of motion of that right shoulder but her rotator cuff strength appears to be reasonably intact to supraspinatus subscap and infraspinatus testing.  Her sensory function to the hand intact bilaterally with intact radial pulse.  Not much in the way of coarse grinding and crepitus in the right hand or shoulder.  She has pain at the extremes of shoulder range of motion.  Cervical spine has about 30 to 40% limited range of motion with flexion extension and rotation.  No definite paresthesias C5-T1.  No other masses lymph adenopathy or skin changes noted in the neck or shoulder girdle region.  Both hands are examined and she has tenderness to palpation at the [facility] joint but intact EPL FPL interosseous function.  No wasting of the interosseous muscles bilaterally.  Positive grind test bilaterally and no tenderness over the radial styloid.  Bilateral knees have mild effusion and patellofemoral crepitus.  She does have a little bit of edema in the lower extremities.  Specialty Comments:  No specialty comments available.  Imaging: No results found.   PMFS History: Patient Active Problem List   Diagnosis Date  Noted  . Hypokalemia   . Pressure injury of skin 12/13/2017  . Chronic respiratory failure with hypercapnia ([place])   . Respiratory distress 12/12/2017  . Acute respiratory failure ([place]) 12/12/2017  . CKD (chronic kidney disease) stage 3, GFR 30-59 ml/min (HCC) 09/22/2017  . OSA and COPD overlap syndrome ([place]) 08/14/2017  . DNR (do not resuscitate) 08/14/2017  . Abnormality of mitral valve annulus   . Aortic atherosclerosis ([place]) 12/09/2016  . CHF (congestive heart failure) ([place]) 11/11/2016  . Dilated cardiomyopathy ([place])   . Pulmonary hypertension ([place])   . Other abnormal glucose   . COPD (chronic obstructive pulmonary disease) ([place]) 07/31/2016  . Atrial flutter with rapid ventricular response ([place]) 07/31/2016  . GERD (gastroesophageal reflux disease) 07/31/2015  . Tobacco abuse 10/18/2014  . Paroxysmal SVT (supraventricular tachycardia) ([place]) 08/04/2014  . Hypothyroidism 02/22/2014  . Medication management 11/06/2013  . Essential hypertension   . Hyperlipidemia   . Morbid obesity ([place])   . Vitamin D deficiency   . Iron deficiency anemia 07/11/2010  . History of colonic polyps 07/11/2010   Past Medical History:  Diagnosis Date  . Arthritis   . CHF (congestive heart failure) ([place])   . COPD (chronic obstructive pulmonary disease) ([place])   . Depression   . Diabetes mellitus type 2 in obese ([place])   . GERD (gastroesophageal reflux disease)   . Hyperlipidemia   . Hypertension   . Morbid obesity ([place])   . OAB (overactive bladder)   . PSVT (paroxysmal supraventricular tachycardia) ([place])   . Thyroid disease   . Vitamin D deficiency     Family History  Problem Relation Age of Onset  . Alcohol abuse Father   . Heart disease Father     Past Surgical History:  Procedure Laterality Date  . CARPAL TUNNEL RELEASE     L 1992 R 1984  . COLONOSCOPY N/A 08/19/2017   Procedure: COLONOSCOPY;  Surgeon: Milus Banister, MD;  [facility] ENDOSCOPY;  Service: Endoscopy;  Laterality: N/A;    . EYE SURGERY Bilateral    cataract  . NM MYOVIEW LTD  01/2011   Dobutamine Myoview: Negative perfusion scan for ischemia / infarct (poor image capture); Pt developed SVT with Dobutamine that reproduced her CP as it resolved with restoration of NSR.  Marland Kitchen PUBOVAGINAL SLING    . TEE WITHOUT CARDIOVERSION N/A 12/16/2016   Procedure: TRANSESOPHAGEAL ECHOCARDIOGRAM (TEE);  Surgeon: Sanda Klein, MD;  Location: [facility] ENDOSCOPY;  Service: Cardiovascular;  Laterality: N/A;  . TONSILLECTOMY AND ADENOIDECTOMY    . TRANSTHORACIC ECHOCARDIOGRAM  01/2011   Hyperdynamic LV with EF 65-70%, Gr 1 DD, Mild Ao Stenosis - mean gradient ~19 mmHg   Social History   Occupational History  . Occupation: retired  Tobacco Use  . Smoking status: Former Smoker    Packs/day: 0.50    Years: 44.00    Pack years: 22.00  Types: Cigarettes    Last attempt to quit: 07/31/2016    Years since quitting: 1.5  . Smokeless tobacco: Never Used  Substance and Sexual Activity  . Alcohol use: No    Alcohol/week: 0.0 oz  . Drug use: No  . Sexual activity: Not on file

## 2018-02-16 DIAGNOSIS — J449 Chronic obstructive pulmonary disease, unspecified: Secondary | ICD-10-CM | POA: Diagnosis not present

## 2018-02-22 DIAGNOSIS — J449 Chronic obstructive pulmonary disease, unspecified: Secondary | ICD-10-CM | POA: Diagnosis not present

## 2018-02-25 ENCOUNTER — Telehealth: Payer: Self-pay | Admitting: Internal Medicine

## 2018-02-25 ENCOUNTER — Other Ambulatory Visit: Payer: Self-pay | Admitting: *Deleted

## 2018-02-25 MED ORDER — METFORMIN HCL ER 500 MG PO TB24: ORAL_TABLET | ORAL | 1 refills | Status: AC

## 2018-02-25 NOTE — Telephone Encounter (Signed)
Son, Roselyn Reef called Requesting DME Rx sent to [place] a new hospital bed mattress for this patient with osteoarthritis support, and skin break down barrier. Per Dr Melford Aase faxed dme order to [facility] for new mattress with pressure sensitive feature, gel barrier overlay, and alternating pressure pump and pad. Called patient to make aware of dme order. Patient and family understands items ordered and intended benefit.

## 2018-02-25 NOTE — Telephone Encounter (Signed)
renewal on Metformin to [facility] [place], [place]

## 2018-02-27 ENCOUNTER — Ambulatory Visit (INDEPENDENT_AMBULATORY_CARE_PROVIDER_SITE_OTHER): Payer: PPO | Admitting: Orthopedic Surgery

## 2018-03-06 ENCOUNTER — Ambulatory Visit: Payer: PPO

## 2018-03-06 DIAGNOSIS — R29898 Other symptoms and signs involving the musculoskeletal system: Secondary | ICD-10-CM | POA: Diagnosis not present

## 2018-03-06 DIAGNOSIS — J449 Chronic obstructive pulmonary disease, unspecified: Secondary | ICD-10-CM | POA: Diagnosis not present

## 2018-03-06 DIAGNOSIS — M5440 Lumbago with sciatica, unspecified side: Secondary | ICD-10-CM | POA: Diagnosis not present

## 2018-03-06 DIAGNOSIS — L8990 Pressure ulcer of unspecified site, unspecified stage: Secondary | ICD-10-CM | POA: Diagnosis not present

## 2018-03-06 DIAGNOSIS — N3281 Overactive bladder: Secondary | ICD-10-CM | POA: Diagnosis not present

## 2018-03-07 DIAGNOSIS — J449 Chronic obstructive pulmonary disease, unspecified: Secondary | ICD-10-CM | POA: Diagnosis not present

## 2018-03-07 DIAGNOSIS — R29898 Other symptoms and signs involving the musculoskeletal system: Secondary | ICD-10-CM | POA: Diagnosis not present

## 2018-03-07 DIAGNOSIS — M5440 Lumbago with sciatica, unspecified side: Secondary | ICD-10-CM | POA: Diagnosis not present

## 2018-03-11 DIAGNOSIS — J9610 Chronic respiratory failure, unspecified whether with hypoxia or hypercapnia: Secondary | ICD-10-CM | POA: Diagnosis not present

## 2018-03-11 DIAGNOSIS — J449 Chronic obstructive pulmonary disease, unspecified: Secondary | ICD-10-CM | POA: Diagnosis not present

## 2018-03-13 ENCOUNTER — Ambulatory Visit (INDEPENDENT_AMBULATORY_CARE_PROVIDER_SITE_OTHER): Payer: PPO | Admitting: Orthopedic Surgery

## 2018-03-23 ENCOUNTER — Ambulatory Visit (INDEPENDENT_AMBULATORY_CARE_PROVIDER_SITE_OTHER): Payer: PPO | Admitting: Orthopedic Surgery

## 2018-03-24 ENCOUNTER — Telehealth: Payer: Self-pay | Admitting: *Deleted

## 2018-03-24 ENCOUNTER — Inpatient Hospital Stay (HOSPITAL_COMMUNITY)
Admission: EM | Admit: 2018-03-24 | Discharge: 2018-04-16 | DRG: 871 | Disposition: E | Payer: PPO | Attending: Family Medicine | Admitting: Family Medicine

## 2018-03-24 ENCOUNTER — Emergency Department (HOSPITAL_COMMUNITY): Payer: PPO

## 2018-03-24 ENCOUNTER — Encounter (HOSPITAL_COMMUNITY): Payer: Self-pay | Admitting: Emergency Medicine

## 2018-03-24 ENCOUNTER — Other Ambulatory Visit: Payer: Self-pay

## 2018-03-24 DIAGNOSIS — J9601 Acute respiratory failure with hypoxia: Secondary | ICD-10-CM | POA: Diagnosis not present

## 2018-03-24 DIAGNOSIS — Z7189 Other specified counseling: Secondary | ICD-10-CM | POA: Diagnosis not present

## 2018-03-24 DIAGNOSIS — I4892 Unspecified atrial flutter: Secondary | ICD-10-CM | POA: Diagnosis present

## 2018-03-24 DIAGNOSIS — Z515 Encounter for palliative care: Secondary | ICD-10-CM

## 2018-03-24 DIAGNOSIS — A419 Sepsis, unspecified organism: Principal | ICD-10-CM

## 2018-03-24 DIAGNOSIS — R230 Cyanosis: Secondary | ICD-10-CM | POA: Diagnosis present

## 2018-03-24 DIAGNOSIS — I130 Hypertensive heart and chronic kidney disease with heart failure and stage 1 through stage 4 chronic kidney disease, or unspecified chronic kidney disease: Secondary | ICD-10-CM | POA: Diagnosis present

## 2018-03-24 DIAGNOSIS — Z79890 Hormone replacement therapy (postmenopausal): Secondary | ICD-10-CM

## 2018-03-24 DIAGNOSIS — N179 Acute kidney failure, unspecified: Secondary | ICD-10-CM | POA: Diagnosis present

## 2018-03-24 DIAGNOSIS — I4891 Unspecified atrial fibrillation: Secondary | ICD-10-CM | POA: Diagnosis present

## 2018-03-24 DIAGNOSIS — Z87891 Personal history of nicotine dependence: Secondary | ICD-10-CM | POA: Diagnosis not present

## 2018-03-24 DIAGNOSIS — I5032 Chronic diastolic (congestive) heart failure: Secondary | ICD-10-CM | POA: Diagnosis present

## 2018-03-24 DIAGNOSIS — E1122 Type 2 diabetes mellitus with diabetic chronic kidney disease: Secondary | ICD-10-CM | POA: Diagnosis present

## 2018-03-24 DIAGNOSIS — J181 Lobar pneumonia, unspecified organism: Secondary | ICD-10-CM | POA: Diagnosis not present

## 2018-03-24 DIAGNOSIS — F419 Anxiety disorder, unspecified: Secondary | ICD-10-CM | POA: Diagnosis present

## 2018-03-24 DIAGNOSIS — J9621 Acute and chronic respiratory failure with hypoxia: Secondary | ICD-10-CM | POA: Diagnosis not present

## 2018-03-24 DIAGNOSIS — J9612 Chronic respiratory failure with hypercapnia: Secondary | ICD-10-CM | POA: Diagnosis present

## 2018-03-24 DIAGNOSIS — J9622 Acute and chronic respiratory failure with hypercapnia: Secondary | ICD-10-CM | POA: Diagnosis present

## 2018-03-24 DIAGNOSIS — E872 Acidosis: Secondary | ICD-10-CM | POA: Diagnosis not present

## 2018-03-24 DIAGNOSIS — Z7982 Long term (current) use of aspirin: Secondary | ICD-10-CM

## 2018-03-24 DIAGNOSIS — N183 Chronic kidney disease, stage 3 (moderate): Secondary | ICD-10-CM | POA: Diagnosis not present

## 2018-03-24 DIAGNOSIS — J9600 Acute respiratory failure, unspecified whether with hypoxia or hypercapnia: Secondary | ICD-10-CM | POA: Diagnosis not present

## 2018-03-24 DIAGNOSIS — I10 Essential (primary) hypertension: Secondary | ICD-10-CM | POA: Diagnosis not present

## 2018-03-24 DIAGNOSIS — J9602 Acute respiratory failure with hypercapnia: Secondary | ICD-10-CM | POA: Diagnosis not present

## 2018-03-24 DIAGNOSIS — Z888 Allergy status to other drugs, medicaments and biological substances status: Secondary | ICD-10-CM

## 2018-03-24 DIAGNOSIS — J440 Chronic obstructive pulmonary disease with acute lower respiratory infection: Secondary | ICD-10-CM | POA: Diagnosis not present

## 2018-03-24 DIAGNOSIS — Z9981 Dependence on supplemental oxygen: Secondary | ICD-10-CM | POA: Diagnosis not present

## 2018-03-24 DIAGNOSIS — Z8249 Family history of ischemic heart disease and other diseases of the circulatory system: Secondary | ICD-10-CM

## 2018-03-24 DIAGNOSIS — J449 Chronic obstructive pulmonary disease, unspecified: Secondary | ICD-10-CM | POA: Diagnosis not present

## 2018-03-24 DIAGNOSIS — I272 Pulmonary hypertension, unspecified: Secondary | ICD-10-CM | POA: Diagnosis not present

## 2018-03-24 DIAGNOSIS — E875 Hyperkalemia: Secondary | ICD-10-CM | POA: Diagnosis present

## 2018-03-24 DIAGNOSIS — Z66 Do not resuscitate: Secondary | ICD-10-CM | POA: Diagnosis present

## 2018-03-24 DIAGNOSIS — J189 Pneumonia, unspecified organism: Secondary | ICD-10-CM

## 2018-03-24 DIAGNOSIS — E119 Type 2 diabetes mellitus without complications: Secondary | ICD-10-CM

## 2018-03-24 DIAGNOSIS — R0602 Shortness of breath: Secondary | ICD-10-CM | POA: Diagnosis not present

## 2018-03-24 DIAGNOSIS — J441 Chronic obstructive pulmonary disease with (acute) exacerbation: Secondary | ICD-10-CM | POA: Diagnosis not present

## 2018-03-24 DIAGNOSIS — R0603 Acute respiratory distress: Secondary | ICD-10-CM | POA: Diagnosis not present

## 2018-03-24 DIAGNOSIS — Z6836 Body mass index (BMI) 36.0-36.9, adult: Secondary | ICD-10-CM

## 2018-03-24 DIAGNOSIS — Z7984 Long term (current) use of oral hypoglycemic drugs: Secondary | ICD-10-CM

## 2018-03-24 DIAGNOSIS — E039 Hypothyroidism, unspecified: Secondary | ICD-10-CM | POA: Diagnosis present

## 2018-03-24 DIAGNOSIS — G4733 Obstructive sleep apnea (adult) (pediatric): Secondary | ICD-10-CM | POA: Diagnosis present

## 2018-03-24 DIAGNOSIS — I998 Other disorder of circulatory system: Secondary | ICD-10-CM | POA: Diagnosis not present

## 2018-03-24 LAB — COMPREHENSIVE METABOLIC PANEL
ALBUMIN: 3.8 g/dL (ref 3.5–5.0)
ALT: 9 U/L (ref 0–44)
ANION GAP: 18 — AB (ref 5–15)
AST: 23 U/L (ref 15–41)
Alkaline Phosphatase: 70 U/L (ref 38–126)
BUN: 40 mg/dL — ABNORMAL HIGH (ref 8–23)
CHLORIDE: 99 mmol/L (ref 98–111)
CO2: 20 mmol/L — AB (ref 22–32)
Calcium: 9.5 mg/dL (ref 8.9–10.3)
Creatinine, Ser: 2.13 mg/dL — ABNORMAL HIGH (ref 0.44–1.00)
GFR calc non Af Amer: 22 mL/min — ABNORMAL LOW (ref >60)
GFR, EST AFRICAN AMERICAN: 26 mL/min — AB (ref >60)
GLUCOSE: 193 mg/dL — AB (ref 70–99)
Potassium: 5.4 mmol/L — ABNORMAL HIGH (ref 3.5–5.1)
Sodium: 137 mmol/L (ref 135–145)
Total Bilirubin: 0.7 mg/dL (ref 0.3–1.2)
Total Protein: 7.2 g/dL (ref 6.5–8.1)

## 2018-03-24 LAB — CBC WITH DIFFERENTIAL/PLATELET
ABS IMMATURE GRANULOCYTES: 0.1 10*3/uL (ref 0.0–0.1)
BASOS PCT: 0 %
Basophils Absolute: 0 10*3/uL (ref 0.0–0.1)
Eosinophils Absolute: 0.1 10*3/uL (ref 0.0–0.7)
Eosinophils Relative: 0 %
HCT: 50.3 % — ABNORMAL HIGH (ref 36.0–46.0)
HEMOGLOBIN: 13.9 g/dL (ref 12.0–15.0)
Immature Granulocytes: 1 %
LYMPHS PCT: 8 %
Lymphs Abs: 0.9 10*3/uL (ref 0.7–4.0)
MCH: 25.9 pg — AB (ref 26.0–34.0)
MCHC: 27.6 g/dL — ABNORMAL LOW (ref 30.0–36.0)
MCV: 93.8 fL (ref 78.0–100.0)
MONO ABS: 0.8 10*3/uL (ref 0.1–1.0)
Monocytes Relative: 7 %
NEUTROS ABS: 9.3 10*3/uL — AB (ref 1.7–7.7)
NEUTROS PCT: 84 %
PLATELETS: 407 10*3/uL — AB (ref 150–400)
RBC: 5.36 MIL/uL — AB (ref 3.87–5.11)
RDW: 16.2 % — ABNORMAL HIGH (ref 11.5–15.5)
WBC: 11.1 10*3/uL — ABNORMAL HIGH (ref 4.0–10.5)

## 2018-03-24 LAB — TROPONIN I
Troponin I: <0.03 ng/mL (ref <0.03)
Troponin I: <0.03 ng/mL (ref <0.03)

## 2018-03-24 LAB — I-STAT CHEM 8, ED
BUN: 41 mg/dL — AB (ref 8–23)
CALCIUM ION: 1.15 mmol/L (ref 1.15–1.40)
CHLORIDE: 102 mmol/L (ref 98–111)
Creatinine, Ser: 1.9 mg/dL — ABNORMAL HIGH (ref 0.44–1.00)
Glucose, Bld: 188 mg/dL — ABNORMAL HIGH (ref 70–99)
HCT: 49 % — ABNORMAL HIGH (ref 36.0–46.0)
HEMOGLOBIN: 16.7 g/dL — AB (ref 12.0–15.0)
POTASSIUM: 5.3 mmol/L — AB (ref 3.5–5.1)
SODIUM: 136 mmol/L (ref 135–145)
TCO2: 19 mmol/L — AB (ref 22–32)

## 2018-03-24 LAB — I-STAT ARTERIAL BLOOD GAS, ED
Acid-base deficit: 11 mmol/L — ABNORMAL HIGH (ref 0.0–2.0)
BICARBONATE: 19.9 mmol/L — AB (ref 20.0–28.0)
O2 SAT: 87 %
PCO2 ART: 63.8 mmHg — AB (ref 32.0–48.0)
PO2 ART: 70 mmHg — AB (ref 83.0–108.0)
Patient temperature: 97.1
TCO2: 22 mmol/L (ref 22–32)
pH, Arterial: 7.097 — CL (ref 7.350–7.450)

## 2018-03-24 LAB — LACTIC ACID, PLASMA: LACTIC ACID, VENOUS: 2.1 mmol/L — AB (ref 0.5–1.9)

## 2018-03-24 LAB — MRSA PCR SCREENING: MRSA BY PCR: NEGATIVE

## 2018-03-24 LAB — I-STAT CG4 LACTIC ACID, ED: Lactic Acid, Venous: 7.59 mmol/L (ref 0.5–1.9)

## 2018-03-24 LAB — GLUCOSE, CAPILLARY: Glucose-Capillary: 136 mg/dL — ABNORMAL HIGH (ref 70–99)

## 2018-03-24 LAB — BRAIN NATRIURETIC PEPTIDE: B NATRIURETIC PEPTIDE 5: 1054.1 pg/mL — AB (ref 0.0–100.0)

## 2018-03-24 MED ORDER — METHYLPREDNISOLONE SODIUM SUCC 125 MG IJ SOLR
80.0000 mg | Freq: Three times a day (TID) | INTRAMUSCULAR | Status: DC
  Administered 2018-03-24 – 2018-03-25 (×3): 80 mg via INTRAVENOUS
  Filled 2018-03-24 (×3): qty 2

## 2018-03-24 MED ORDER — VANCOMYCIN HCL 10 G IV SOLR
2000.0000 mg | Freq: Once | INTRAVENOUS | Status: AC
  Administered 2018-03-24: 2000 mg via INTRAVENOUS
  Filled 2018-03-24: qty 2000

## 2018-03-24 MED ORDER — VANCOMYCIN HCL 10 G IV SOLR: 1500.0000 mg | INTRAVENOUS | Status: DC

## 2018-03-24 MED ORDER — SODIUM CHLORIDE 0.9 % IV SOLN: 250.0000 mL | INTRAVENOUS | Status: DC | PRN

## 2018-03-24 MED ORDER — DIAZEPAM 5 MG PO TABS: 5.0000 mg | ORAL_TABLET | Freq: Two times a day (BID) | ORAL | Status: DC | PRN

## 2018-03-24 MED ORDER — ALBUTEROL SULFATE (2.5 MG/3ML) 0.083% IN NEBU: 2.5000 mg | INHALATION_SOLUTION | Freq: Four times a day (QID) | RESPIRATORY_TRACT | Status: DC | PRN

## 2018-03-24 MED ORDER — DILTIAZEM HCL ER COATED BEADS 180 MG PO CP24: 300.0000 mg | ORAL_CAPSULE | Freq: Every day | ORAL | Status: DC

## 2018-03-24 MED ORDER — ASPIRIN EC 81 MG PO TBEC: 81.0000 mg | DELAYED_RELEASE_TABLET | Freq: Every day | ORAL | Status: DC

## 2018-03-24 MED ORDER — GUAIFENESIN ER 600 MG PO TB12: 600.0000 mg | ORAL_TABLET | Freq: Two times a day (BID) | ORAL | Status: DC

## 2018-03-24 MED ORDER — POLYETHYLENE GLYCOL 3350 17 G PO PACK: 17.0000 g | PACK | Freq: Two times a day (BID) | ORAL | Status: DC

## 2018-03-24 MED ORDER — INSULIN ASPART 100 UNIT/ML ~~LOC~~ SOLN
0.0000 [IU] | SUBCUTANEOUS | Status: DC
  Administered 2018-03-24 – 2018-03-25 (×4): 1 [IU] via SUBCUTANEOUS
  Administered 2018-03-25: 2 [IU] via SUBCUTANEOUS

## 2018-03-24 MED ORDER — FAMOTIDINE 20 MG PO TABS: 20.0000 mg | ORAL_TABLET | Freq: Two times a day (BID) | ORAL | Status: DC

## 2018-03-24 MED ORDER — ATENOLOL 25 MG PO TABS: 50.0000 mg | ORAL_TABLET | Freq: Every day | ORAL | Status: DC

## 2018-03-24 MED ORDER — PANTOPRAZOLE SODIUM 40 MG PO TBEC: 40.0000 mg | DELAYED_RELEASE_TABLET | Freq: Every day | ORAL | Status: DC

## 2018-03-24 MED ORDER — FUROSEMIDE 80 MG PO TABS: 80.0000 mg | ORAL_TABLET | Freq: Every day | ORAL | Status: DC

## 2018-03-24 MED ORDER — VANCOMYCIN HCL IN DEXTROSE 1-5 GM/200ML-% IV SOLN: 1000.0000 mg | Freq: Once | INTRAVENOUS | Status: DC

## 2018-03-24 MED ORDER — ALBUTEROL (5 MG/ML) CONTINUOUS INHALATION SOLN
INHALATION_SOLUTION | RESPIRATORY_TRACT | Status: AC
  Administered 2018-03-24: 18:00:00
  Filled 2018-03-24: qty 20

## 2018-03-24 MED ORDER — LEVOTHYROXINE SODIUM 75 MCG PO TABS: 175.0000 ug | ORAL_TABLET | Freq: Every day | ORAL | Status: DC

## 2018-03-24 MED ORDER — ENOXAPARIN SODIUM 40 MG/0.4ML ~~LOC~~ SOLN
40.0000 mg | SUBCUTANEOUS | Status: DC
  Administered 2018-03-24: 40 mg via SUBCUTANEOUS
  Filled 2018-03-24: qty 0.4

## 2018-03-24 MED ORDER — CHLORHEXIDINE GLUCONATE 0.12 % MT SOLN: 15.0000 mL | Freq: Two times a day (BID) | OROMUCOSAL | Status: DC

## 2018-03-24 MED ORDER — PRAVASTATIN SODIUM 40 MG PO TABS: 40.0000 mg | ORAL_TABLET | Freq: Every day | ORAL | Status: DC

## 2018-03-24 MED ORDER — DULOXETINE HCL 60 MG PO CPEP: 60.0000 mg | ORAL_CAPSULE | Freq: Every day | ORAL | Status: DC

## 2018-03-24 MED ORDER — ORAL CARE MOUTH RINSE
15.0000 mL | Freq: Two times a day (BID) | OROMUCOSAL | Status: DC
  Administered 2018-03-25: 15 mL via OROMUCOSAL

## 2018-03-24 MED ORDER — TIOTROPIUM BROMIDE MONOHYDRATE 18 MCG IN CAPS
18.0000 ug | ORAL_CAPSULE | Freq: Every day | RESPIRATORY_TRACT | Status: DC
  Filled 2018-03-24: qty 5

## 2018-03-24 MED ORDER — ACETAMINOPHEN-CODEINE #3 300-30 MG PO TABS: 1.0000 | ORAL_TABLET | Freq: Three times a day (TID) | ORAL | Status: DC | PRN

## 2018-03-24 MED ORDER — IPRATROPIUM BROMIDE 0.02 % IN SOLN
0.5000 mg | Freq: Once | RESPIRATORY_TRACT | Status: AC
  Administered 2018-03-24: 0.5 mg via RESPIRATORY_TRACT

## 2018-03-24 MED ORDER — ALBUTEROL SULFATE (2.5 MG/3ML) 0.083% IN NEBU
2.5000 mg | INHALATION_SOLUTION | Freq: Four times a day (QID) | RESPIRATORY_TRACT | Status: DC
  Administered 2018-03-24 – 2018-03-25 (×3): 2.5 mg via RESPIRATORY_TRACT
  Filled 2018-03-24 (×3): qty 3

## 2018-03-24 MED ORDER — SODIUM CHLORIDE 0.9% FLUSH
3.0000 mL | Freq: Two times a day (BID) | INTRAVENOUS | Status: DC
  Administered 2018-03-25: 3 mL via INTRAVENOUS

## 2018-03-24 MED ORDER — LORAZEPAM 2 MG/ML IJ SOLN
0.5000 mg | Freq: Once | INTRAMUSCULAR | Status: AC
  Administered 2018-03-25: 0.5 mg via INTRAVENOUS
  Filled 2018-03-24: qty 1

## 2018-03-24 MED ORDER — SODIUM CHLORIDE 0.9% FLUSH
3.0000 mL | INTRAVENOUS | Status: DC | PRN
  Administered 2018-03-25 (×2): 3 mL via INTRAVENOUS
  Filled 2018-03-24 (×2): qty 3

## 2018-03-24 MED ORDER — SODIUM CHLORIDE 0.9 % IV SOLN
2.0000 g | Freq: Once | INTRAVENOUS | Status: AC
  Administered 2018-03-24: 2 g via INTRAVENOUS
  Filled 2018-03-24: qty 2

## 2018-03-24 MED ORDER — ALBUTEROL (5 MG/ML) CONTINUOUS INHALATION SOLN
10.0000 mg/h | INHALATION_SOLUTION | RESPIRATORY_TRACT | Status: DC
  Filled 2018-03-24: qty 20

## 2018-03-24 MED ORDER — SODIUM CHLORIDE 0.9 % IV SOLN: 2.0000 g | INTRAVENOUS | Status: DC

## 2018-03-24 MED ORDER — ONDANSETRON HCL 4 MG/2ML IJ SOLN: 4.0000 mg | Freq: Four times a day (QID) | INTRAMUSCULAR | Status: DC | PRN

## 2018-03-24 NOTE — ED Triage Notes (Signed)
Pt has hx of COPD and CHF. Pt O2 sats 77% on 8L , pt normally wears 8L O2 at home. Pt pale and cyanotic. Pt with complaints of SHOB

## 2018-03-24 NOTE — Progress Notes (Signed)
Pt transported to 9Y72 w/o complications. Pt placed on a V60 NIV machine on arrival to patient room. Report given to unit RRT. Patient is stable at this time, RN at bedside.

## 2018-03-24 NOTE — ED Provider Notes (Signed)
[facility] EMERGENCY DEPARTMENT Provider Note   CSN: [id number] Arrival date & time: 03/23/2018  1728     History   Chief Complaint Chief Complaint  Patient presents with  . Respiratory Distress  . Altered Mental Status    HPI Jillian Wood is a 72 y.o. female.  HPI  The pt is a 73 y/o female - she has severe chronic illnesses including CHF and COPD (end stage) as well as DM and PSVT, she is morbidly obese on 8L of [place] chronically, she presents with her son who is the primary historian saying that she has become progressively more short of breath, more edematous and is hypoxic on her home oxygen and BiPAP.  The patient is unable to give any information that she has progressively altered as well.  Evidently the patient has had progressive hypoxia, has become more more pale, she has a DO NOT RESUSCITATE order and the family states that she should be comfort care but would require medications and BiPAP as needed.,  Symptoms are severe and persistent  Past Medical History:  Diagnosis Date  . Arthritis   . CHF (congestive heart failure) ([place])   . COPD (chronic obstructive pulmonary disease) ([place])   . Depression   . Diabetes mellitus type 2 in obese ([place])   . GERD (gastroesophageal reflux disease)   . Hyperlipidemia   . Hypertension   . Morbid obesity ([place])   . OAB (overactive bladder)   . PSVT (paroxysmal supraventricular tachycardia) ([place])   . Thyroid disease   . Vitamin D deficiency     Patient Active Problem List   Diagnosis Date Noted  . Hypokalemia   . Pressure injury of skin 12/13/2017  . Chronic respiratory failure with hypercapnia ([place])   . Respiratory distress 12/12/2017  . Acute respiratory failure ([place]) 12/12/2017  . CKD (chronic kidney disease) stage 3, GFR 30-59 ml/min (H[facility]) 09/22/2017  . OSA and COPD overlap syndrome ([place]) 08/14/2017  . DNR (do not resuscitate) 08/14/2017  . Abnormality of mitral valve annulus   . Aortic atherosclerosis  ([place]) 12/09/2016  . CHF (congestive heart failure) ([place]) 11/11/2016  . Dilated cardiomyopathy ([place])   . Pulmonary hypertension ([place])   . Other abnormal glucose   . COPD (chronic obstructive pulmonary disease) ([place]) 07/31/2016  . Atrial flutter with rapid ventricular response ([place]) 07/31/2016  . GERD (gastroesophageal reflux disease) 07/31/2015  . Tobacco abuse 10/18/2014  . Paroxysmal SVT (supraventricular tachycardia) ([place]) 08/04/2014  . Hypothyroidism 02/22/2014  . Medication management 11/06/2013  . Essential hypertension   . Hyperlipidemia   . Morbid obesity ([place])   . Vitamin D deficiency   . Iron deficiency anemia 07/11/2010  . History of colonic polyps 07/11/2010    Past Surgical History:  Procedure Laterality Date  . CARPAL TUNNEL RELEASE     L 1992 R 1984  . COLONOSCOPY N/A 08/19/2017   Procedure: COLONOSCOPY;  Surgeon: Milus Banister, MD;  [facility] END[facility]COPY;  Service: Endoscopy;  Laterality: N/A;  . EYE SURGERY Bilateral    cataract  . NM MYOVIEW LTD  01/2011   Dobutamine Myoview: Negative perfusion scan for ischemia / infarct (poor image capture); Pt developed SVT with Dobutamine that reproduced her CP as it resolved with restoration of NSR.  Marland Kitchen PUBOVAGINAL SLING    . TEE WITHOUT CARDIOVERSION N/A 12/16/2016   Procedure: TRANSESOPHAGEAL ECHOCARDIOGRAM (TEE);  Surgeon: Sanda Klein, MD;  Location: [facility] ENDOSCOPY;  Service: Cardiovascular;  Laterality: N/A;  . TONSILLECTOMY AND ADENOIDECTOMY    .  TRANSTHORACIC ECHOCARDIOGRAM  01/2011   Hyperdynamic LV with EF 65-70%, Gr 1 DD, Mild Ao Stenosis - mean gradient ~19 mmHg     OB History   None      Home Medications    Prior to Admission medications   Medication Sig Start Date End Date Taking? Authorizing Provider  acetaminophen-codeine (TYLENOL #3) 300-30 MG tablet Take 1 tablet by mouth every 8 (eight) hours as needed for moderate pain or severe pain. 01/07/18   [name], PA-C  acetaZOLAMIDE (DIAMOX) 250 MG  tablet TAKE 1 TABLET (250 MG TOTAL) BY MOUTH 3 (THREE) TIMES DAILY. 01/27/18   Liane Comber, NP  albuterol (PROVENTIL HFA;VENTOLIN HFA) 108 (90 Base) MCG/ACT inhaler Inhale 1-2 puffs into the lungs every 6 (six) hours as needed for wheezing or shortness of breath. 05/30/17   [name], PA-C  albuterol (PROVENTIL) (2.5 MG/3ML) 0.083% nebulizer solution Take 3 mLs (2.5 mg total) by nebulization every 6 (six) hours as needed for wheezing or shortness of breath. 11/10/17   [name], PA-C  aspirin 81 MG tablet Take 81 mg by mouth daily.    [provider]  atenolol (TENORMIN) 50 MG tablet TAKE 1 TABLET BY MOUTH EVERY DAY 12/31/17   [name], PA-C  budesonide (PULMICORT) 0.5 MG/2ML nebulizer solution Take 2 mLs (0.5 mg total) by nebulization 2 (two) times daily. 03/20/17   [name], PA-C  Cholecalciferol (VITAMIN D PO) Take 2,000 Units by mouth daily.     [provider]  diazepam (VALIUM) 5 MG tablet Take one tablet up to 2 x a daily for anxiety AS NEEDED 03/20/17   [name], PA-C  diltiazem (CARDIZEM CD) 300 MG 24 hr capsule TAKE 1 CAPSULE (300 MG TOTAL) BY MOUTH DAILY. 06/21/17   Unk Pinto, MD  DULoxetine (CYMBALTA) 60 MG capsule TAKE 1 CAPSULE BY MOUTH DAILY Patient taking differently: TAKE 60 mg  CAPSULE BY MOUTH DAILY 08/30/17   Unk Pinto, MD  famotidine (PEPCID) 40 MG tablet Take 40 mg by mouth 2 (two) times daily.    [provider]  fluticasone (FLONASE) 50 MCG/ACT nasal spray Place 2 sprays into both nostrils daily. Patient taking differently: Place 2 sprays into both nostrils daily as needed for allergies.  10/29/16   Forcucci, Courtney, PA-C  furosemide (LASIX) 80 MG tablet Take 1 tablet (80 mg total) by mouth daily. 08/12/17   Unk Pinto, MD  guaiFENesin (MUCINEX) 600 MG 12 hr tablet Take 600 mg by mouth 2 (two) times daily.     [provider]  ipratropium (ATROVENT) 0.02 % nebulizer solution Take 2.5 mLs (0.5  mg total) by nebulization every 6 (six) hours. 08/20/17   Lavina Hamman, MD  levothyroxine (SYNTHROID, LEVOTHROID) 175 MCG tablet TAKE 1 TABLET (175 MCG TOTAL) BY MOUTH DAILY BEFORE BREAKFAST. 12/31/17   [name], PA-C  loratadine-pseudoephedrine (CLARITIN-D 24-HOUR) 10-240 MG per 24 hr tablet Take 1 tablet by mouth as needed for allergies.     [provider]  Magnesium Chloride (MAGNESIUM DR PO) Take 1 capsule by mouth daily.     [provider]  metFORMIN (GLUCOPHAGE-XR) 500 MG 24 hr tablet TAKE 1 TABLET (500 MG TOTAL) BY MOUTH 4 (FOUR) TIMES DAILY - AFTER MEALS AND AT BEDTIME. 02/25/18   Unk Pinto, MD  metolazone (ZAROXOLYN) 5 MG tablet TAKE 1 PILL 1 HOUR BEFORE THE LASIX DAILY AS NEEDED FOR WEIGHT GAIN/EDEMA. 01/23/18   Unk Pinto, MD  OVER THE COUNTER MEDICATION Slow-release iron 45 mg  2 tablets daily    [provider]  OXYGEN-HELIUM IN Inhale 8 L into the lungs continuous.     [provider]  polyethylene glycol (MIRALAX / GLYCOLAX) packet Take 17 g by mouth 2 (two) times daily. 08/20/17   Lavina Hamman, MD  pravastatin (PRAVACHOL) 40 MG tablet TAKE 1 TABLET BY MOUTH EVERY DAY Patient taking differently: TAKE 40 mg TABLET BY MOUTH EVERY DAY 11/25/17   Liane Comber, NP    Family History Family History  Problem Relation Age of Onset  . Alcohol abuse Father   . Heart disease Father     Social History Social History   Tobacco Use  . Smoking status: Former Smoker    Packs/day: 0.50    Years: 44.00    Pack years: 22.00    Types: Cigarettes    Last attempt to quit: 07/31/2016    Years since quitting: 1.6  . Smokeless tobacco: Never Used  Substance Use Topics  . Alcohol use: No    Alcohol/week: 0.0 oz  . Drug use: No     Allergies   Inderal [propranolol] and Ace inhibitors   Review of Systems Review of Systems  Unable to perform ROS: Mental status change     Physical Exam Updated Vital Signs There were no  vitals taken for this visit.  Physical Exam  Constitutional: She appears well-developed and well-nourished. She appears distressed.  HENT:  Head: Normocephalic and atraumatic.  Mouth/Throat: Oropharynx is clear and moist. No oropharyngeal exudate.  Eyes: Pupils are equal, round, and reactive to light. Conjunctivae and EOM are normal. Right eye exhibits no discharge. Left eye exhibits no discharge. No scleral icterus.  Neck: Normal range of motion. Neck supple. No JVD present. No thyromegaly present.  Cardiovascular: Regular rhythm, normal heart sounds and intact distal pulses. Exam reveals no gallop and no friction rub.  No murmur heard. Tachycardic  Pulmonary/Chest: She is in respiratory distress. She has no wheezes. She has rales.  Abdominal: Soft. Bowel sounds are normal. She exhibits no distension and no mass. There is no tenderness.  Musculoskeletal: Normal range of motion. She exhibits edema ( 2+ pitting bilaterally symmetrically). She exhibits no tenderness.  Lymphadenopathy:    She has no cervical adenopathy.  Neurological: She is alert. Coordination normal.  Skin: Skin is warm and dry. No rash noted. No erythema.  Psychiatric: She has a normal mood and affect. Her behavior is normal.  Nursing note and vitals reviewed.    ED Treatments / Results  Labs (all labs ordered are listed, but only abnormal results are displayed) Labs Reviewed  CBC WITH DIFFERENTIAL/PLATELET - Abnormal; Notable for the following components:      Result Value   WBC 11.1 (*)    RBC 5.36 (*)    HCT 50.3 (*)    MCH 25.9 (*)    MCHC 27.6 (*)    RDW 16.2 (*)    Platelets 407 (*)    Neutro Abs 9.3 (*)    All other components within normal limits  COMPREHENSIVE METABOLIC PANEL - Abnormal; Notable for the following components:   Potassium 5.4 (*)    CO2 20 (*)    Glucose, Bld 193 (*)    BUN 40 (*)    Creatinine, Ser 2.13 (*)    GFR calc non Af Amer 22 (*)    GFR calc Af Amer 26 (*)    Anion gap  18 (*)    All other components within normal limits  BRAIN  NATRIURETIC PEPTIDE - Abnormal; Notable for the following components:   B Natriuretic Peptide 1,054.1 (*)    All other components within normal limits  CBC - Abnormal; Notable for the following components:   MCH 25.6 (*)    MCHC 28.3 (*)    RDW 16.2 (*)    All other components within normal limits  COMPREHENSIVE METABOLIC PANEL - Abnormal; Notable for the following components:   Glucose, Bld 160 (*)    BUN 48 (*)    Creatinine, Ser 2.25 (*)    Total Protein 6.4 (*)    GFR calc non Af Amer 21 (*)    GFR calc Af Amer 24 (*)    Anion gap 17 (*)    All other components within normal limits  GLUCOSE, CAPILLARY - Abnormal; Notable for the following components:   Glucose-Capillary 136 (*)    All other components within normal limits  LACTIC ACID, PLASMA - Abnormal; Notable for the following components:   Lactic Acid, Venous 2.1 (*)    All other components within normal limits  GLUCOSE, CAPILLARY - Abnormal; Notable for the following components:   Glucose-Capillary 150 (*)    All other components within normal limits  GLUCOSE, CAPILLARY - Abnormal; Notable for the following components:   Glucose-Capillary 156 (*)    All other components within normal limits  GLUCOSE, CAPILLARY - Abnormal; Notable for the following components:   Glucose-Capillary 134 (*)    All other components within normal limits  BLOOD GAS, ARTERIAL - Abnormal; Notable for the following components:   pH, Arterial 7.241 (*)    pCO2 arterial 51.0 (*)    pO2, Arterial 73.9 (*)    Acid-base deficit 4.8 (*)    All other components within normal limits  GLUCOSE, CAPILLARY - Abnormal; Notable for the following components:   Glucose-Capillary 144 (*)    All other components within normal limits  I-STAT CHEM 8, ED - Abnormal; Notable for the following components:   Potassium 5.3 (*)    BUN 41 (*)    Creatinine, Ser 1.90 (*)    Glucose, Bld 188 (*)    TCO2 19  (*)    Hemoglobin 16.7 (*)    HCT 49.0 (*)    All other components within normal limits  I-STAT CG4 LACTIC ACID, ED - Abnormal; Notable for the following components:   Lactic Acid, Venous 7.59 (*)    All other components within normal limits  I-STAT ARTERIAL BLOOD GAS, ED - Abnormal; Notable for the following components:   pH, Arterial 7.097 (*)    pCO2 arterial 63.8 (*)    pO2, Arterial 70.0 (*)    Bicarbonate 19.9 (*)    Acid-base deficit 11.0 (*)    All other components within normal limits  CULTURE, BLOOD (ROUTINE X 2)  CULTURE, BLOOD (ROUTINE X 2)  MRSA PCR SCREENING  RESPIRATORY PANEL BY PCR  TROPONIN I  HIV ANTIBODY (ROUTINE TESTING)  TROPONIN I  TROPONIN I  TROPONIN I  LACTIC ACID, PLASMA    EKG None  Radiology Dg Chest Port 1 View  Result Date: 04/13/2018 CLINICAL DATA:  Shortness of breath. EXAM: PORTABLE CHEST 1 VIEW COMPARISON:  Chest x-ray dated December 13, 2017. FINDINGS: The patient's chin obscures the upper thorax. Stable cardiomegaly and diffuse interstitial coarsening. Patchy opacities at the right lung base with possible small right pleural effusion. No pneumothorax. No acute osseous abnormality. Prominent distention of the stomach. IMPRESSION: 1. Right lower lobe atelectasis versus pneumonia. Possible small  right pleural effusion. 2. Stable cardiomegaly and pulmonary interstitial edema. Electronically Signed   By: Titus Dubin M.D.   On: 03/27/2018 18:16    Procedures .Critical Care Performed by: [name], MD Authorized by: [name], MD   Critical care provider statement:    Critical care time (minutes):  35   Critical care time was exclusive of:  Separately billable procedures and treating other patients and teaching time   Critical care was necessary to treat or prevent imminent or life-threatening deterioration of the following conditions:  Respiratory failure and sepsis   Critical care was time spent personally by me on the following  activities:  Blood draw for specimens, development of treatment plan with patient or surrogate, discussions with consultants, evaluation of patient's response to treatment, examination of patient, obtaining history from patient or surrogate, ordering and performing treatments and interventions, ordering and review of laboratory studies, ordering and review of radiographic studies, pulse oximetry, re-evaluation of patient's condition and review of old charts   (including critical care time)  Medications Ordered in ED Medications  HYDROmorphone (DILAUDID) bolus via infusion 0.5 mg (0.5 mg Intravenous Bolus from Bag 03-Apr-2018 1703)  HYDROmorphone (DILAUDID) 50 mg in sodium chloride 0.9 % 100 mL (0.5 mg/mL) infusion (0 mg/hr Intravenous Stopped 2018/04/03 1800)  LORazepam (ATIVAN) injection 2 mg (2 mg Intravenous Given 2018-04-03 1608)  haloperidol lactate (HALDOL) injection 0.5 mg (has no administration in time range)  glycopyrrolate (ROBINUL) injection 0.2 mg (has no administration in time range)  albuterol (PROVENTIL, VENTOLIN) (5 MG/ML) 0.5% continuous inhalation solution (  Given 03/30/2018 1758)  ipratropium (ATROVENT) nebulizer solution 0.5 mg (0.5 mg Nebulization Given 04/12/2018 1826)  ceFEPIme (MAXIPIME) 2 g in sodium chloride 0.9 % 100 mL IVPB (0 g Intravenous Stopped 04/07/2018 2030)  vancomycin (VANCOCIN) 2,000 mg in sodium chloride 0.9 % 500 mL IVPB (0 mg Intravenous Stopped 03-Apr-2018 0006)  LORazepam (ATIVAN) injection 0.5 mg (0.5 mg Intravenous Given 04-03-2018 0003)  HYDROmorphone (DILAUDID) injection 1 mg (1 mg Intravenous Given 03-Apr-2018 1547)     Initial Impression / Assessment and Plan / ED Course  I have reviewed the triage vital signs and the nursing notes.  Pertinent labs & imaging results that were available during my care of the patient were reviewed by me and considered in my medical decision making (see chart for details).     The patient is ill-appearing, she is in respiratory distress, she  has diffuse rales wheezing and increased work of breathing.  She refuses intubation but will accept BiPAP.  Will start continuous nebulizer therapy, she will likely need Lasix and admission to a high level of care.  The patient is severely ill, she is critically ill with what appears to be pneumonia and severe respiratory distress with respiratory failure however because of her DO NOT RESUSCITATE status we are limited to BiPAP.  The patient initially did okay on this however she will need to be admitted to the hospital to a high level of care.  Broad-spectrum antibiotics were given  Final Clinical Impressions(s) / ED Diagnoses   Final diagnoses:  Respiratory distress  Community acquired pneumonia, unspecified laterality    ED Discharge Orders    None       [name], MD April 03, 2018 2227

## 2018-03-24 NOTE — ED Provider Notes (Signed)
Patient placed in Quick Look pathway, seen and evaluated   Chief Complaint: sob  HPI:   Significant hx of CHF, COPD on 8L home O2, here with worsening SOB and altered mental status since this AM  ROS: no cough or fever, no report of chest pain (one)  Physical Exam:   Gen: No distress  Neuro: Awake and Alert  Skin: Warm    Focused Exam: chronically ill appearing, pale.  Lungs decreased lung sounds with rales bilaterally.    Initiation of care has begun. The patient has been counseled on the process, plan, and necessity for staying for the completion/evaluation, and the remainder of the medical screening examination    Jillian Wood, [name] 03/30/2018 1735    [name], MD 04-11-18 2227

## 2018-03-24 NOTE — ED Notes (Signed)
ED Provider at bedside. 

## 2018-03-24 NOTE — Progress Notes (Signed)
Pharmacy Antibiotic Note  Jillian Wood is a 72 y.o. female admitted on 04/01/2018 with pneumonia.  Pharmacy has been consulted for vancomycin and cefepime dosing. Pt is afebrile and WBC is mildly elevated at 11.1. SCr is elevated above baseline at 2.13. Lactic acid is elevated at 7.59.   Plan: Vancomycin 2gm IV x 1 then 1500mg  IV Q48H Cefepime 2gm IV Q24h F/u renal fxn, C&S, clinical status and trough at SS  Height: 5\' 7"  (170.2 cm) Weight: 232 lb (105.2 kg) IBW/kg (Calculated) : 61.6  Temp (24hrs), Avg:97.1 F (36.2 C), Min:97.1 F (36.2 C), Max:97.1 F (36.2 C)  Recent Labs  Lab 03/26/2018 1750 04/03/2018 1755  WBC 11.1*  --   CREATININE 2.13* 1.90*  LATICACIDVEN  --  7.59*    Estimated Creatinine Clearance: 33.4 mL/min (A) (by C-G formula based on SCr of 1.9 mg/dL (H)).    Allergies  Allergen Reactions  . Inderal [Propranolol] Other (See Comments)    Hair loss  . Ace Inhibitors Cough    Antimicrobials this admission: Vanc 7/9>> Cefepime 7/9>>  Dose adjustments this admission: N/A  Microbiology results: Pending  Thank you for allowing pharmacy to be a part of this patient's care.  Chrisanna Mishra, Rande Lawman 04/11/2018 7:34 PM

## 2018-03-24 NOTE — ED Notes (Signed)
Pt placed on Bi Pap   Pt's son at bedside

## 2018-03-24 NOTE — H&P (Signed)
TRH H&P   Patient Demographics:    Jillian Wood, is a 72 y.o. female  MRN: [id number]   DOB - Feb 09, 1946  Admit Date - 03/23/2018  Outpatient Primary MD for the patient is Unk Pinto, MD  Referring MD/NP/PA: [name]  Outpatient Specialists:   Patient coming from: home  Chief Complaint  Patient presents with  . Respiratory Distress  . Altered Mental Status      HPI:    Jillian Wood  is a 72 y.o. female, w hypertension , hyperlipidemia, dm2, CHF (EF 55-60% ), Copd, OSA on cpap, apparently was at home slightly confused, as well as having difficulty with her breathing and therefore brought to ER for evaluation.  Slight cough, dry.  In ED,  CXR IMPRESSION: 1. Right lower lobe atelectasis versus pneumonia. Possible small right pleural effusion. 2. Stable cardiomegaly and pulmonary interstitial edema.  Wbc 11.1, hgb 13.9, Plt 407 Na 137, K 5.4, Bun 40, Creatinine 2.13 Ast 23, Alt 9  Trop <0.03  BNP 1,054.1  Repeat K 5.3  Pt will be admitted for Acute respiratory failure secondary to pneumonia, Copd exacerbation    Review of systems:    In addition to the HPI above,  No Fever-chills, No Headache, No changes with Vision or hearing, No problems swallowing food or Liquids, No Chest pain, No Abdominal pain, No Nausea or Vommitting, Bowel movements are regular, No Blood in stool or Urine, No dysuria, No new skin rashes or bruises, No new joints pains-aches,  No new weakness, tingling, numbness in any extremity, No recent weight gain or loss, No polyuria, polydypsia or polyphagia, No significant Mental Stressors.  A full 10 point Review of Systems was done, except as stated above, all other Review of Systems were negative.   With Past History of the following :    Past Medical History:  Diagnosis Date  . Arthritis   . CHF (congestive heart  failure) ([place])   . COPD (chronic obstructive pulmonary disease) ([place])   . Depression   . Diabetes mellitus type 2 in obese ([place])   . GERD (gastroesophageal reflux disease)   . Hyperlipidemia   . Hypertension   . Morbid obesity ([place])   . OAB (overactive bladder)   . PSVT (paroxysmal supraventricular tachycardia) ([place])   . Thyroid disease   . Vitamin D deficiency       Past Surgical History:  Procedure Laterality Date  . CARPAL TUNNEL RELEASE     L 1992 R 1984  . COLONOSCOPY N/A 08/19/2017   Procedure: COLONOSCOPY;  Surgeon: Milus Banister, MD;  Location: [facility] ENDOSCOPY;  Service: Endoscopy;  Laterality: N/A;  . EYE SURGERY Bilateral    cataract  . NM MYOVIEW LTD  01/2011   Dobutamine Myoview: Negative perfusion scan for ischemia / infarct (poor image capture); Pt developed SVT with Dobutamine that reproduced her CP as it resolved with  restoration of NSR.  Marland Kitchen PUBOVAGINAL SLING    . TEE WITHOUT CARDIOVERSION N/A 12/16/2016   Procedure: TRANSESOPHAGEAL ECHOCARDIOGRAM (TEE);  Surgeon: Sanda Klein, MD;  Location: [facility] ENDOSCOPY;  Service: Cardiovascular;  Laterality: N/A;  . TONSILLECTOMY AND ADENOIDECTOMY    . TRANSTHORACIC ECHOCARDIOGRAM  01/2011   Hyperdynamic LV with EF 65-70%, Gr 1 DD, Mild Ao Stenosis - mean gradient ~19 mmHg      Social History:     Social History   Tobacco Use  . Smoking status: Former Smoker    Packs/day: 0.50    Years: 44.00    Pack years: 22.00    Types: Cigarettes    Last attempt to quit: 07/31/2016    Years since quitting: 1.6  . Smokeless tobacco: Never Used  Substance Use Topics  . Alcohol use: No    Alcohol/week: 0.0 oz     Lives - at home  Mobility - walks by self   Family History :     Family History  Problem Relation Age of Onset  . Alcohol abuse Father   . Heart disease Father        Home Medications:   Prior to Admission medications   Medication Sig Start Date End Date Taking? Authorizing Provider  acetaminophen-codeine  (TYLENOL #3) 300-30 MG tablet Take 1 tablet by mouth every 8 (eight) hours as needed for moderate pain or severe pain. 01/07/18  Yes [name], PA-C  acetaZOLAMIDE (DIAMOX) 250 MG tablet TAKE 1 TABLET (250 MG TOTAL) BY MOUTH 3 (THREE) TIMES DAILY. Patient taking differently: Take 250-500 mg by mouth See admin instructions. Take 500 mg in the morning and 250 mg in the evening 01/27/18  Yes Liane Comber, NP  albuterol (PROVENTIL HFA;VENTOLIN HFA) 108 (90 Base) MCG/ACT inhaler Inhale 1-2 puffs into the lungs every 6 (six) hours as needed for wheezing or shortness of breath. 05/30/17  Yes [name], PA-C  albuterol (PROVENTIL) (2.5 MG/3ML) 0.083% nebulizer solution Take 3 mLs (2.5 mg total) by nebulization every 6 (six) hours as needed for wheezing or shortness of breath. 11/10/17  Yes [name], PA-C  aspirin 81 MG tablet Take 81 mg by mouth daily.   Yes [provider]  atenolol (TENORMIN) 50 MG tablet TAKE 1 TABLET BY MOUTH EVERY DAY 12/31/17  Yes [name], PA-C  budesonide (PULMICORT) 0.5 MG/2ML nebulizer solution Take 2 mLs (0.5 mg total) by nebulization 2 (two) times daily. 03/20/17  Yes [name], PA-C  Cholecalciferol (VITAMIN D PO) Take 2,000 Units by mouth daily.    Yes [provider]  diazepam (VALIUM) 5 MG tablet Take one tablet up to 2 x a daily for anxiety AS NEEDED 03/20/17  Yes [name], PA-C  diltiazem (CARDIZEM CD) 300 MG 24 hr capsule TAKE 1 CAPSULE (300 MG TOTAL) BY MOUTH DAILY. 06/21/17  Yes Unk Pinto, MD  DULoxetine (CYMBALTA) 60 MG capsule TAKE 1 CAPSULE BY MOUTH DAILY Patient taking differently: TAKE 60 mg  CAPSULE BY MOUTH DAILY 08/30/17  Yes Unk Pinto, MD  famotidine (PEPCID) 20 MG tablet Take 20 mg by mouth 2 (two) times daily. 12/29/17  Yes [provider]  fluticasone (FLONASE) 50 MCG/ACT nasal spray Place 2 sprays into both nostrils daily. Patient taking differently: Place 2 sprays into both nostrils  daily as needed for allergies.  10/29/16  Yes Forcucci, Courtney, PA-C  furosemide (LASIX) 80 MG tablet Take 1 tablet (80 mg total) by mouth daily. 08/12/17  Yes Unk Pinto, MD  guaiFENesin [facility])  600 MG 12 hr tablet Take 600 mg by mouth 2 (two) times daily.    Yes [provider]  ipratropium (ATROVENT) 0.02 % nebulizer solution Take 2.5 mLs (0.5 mg total) by nebulization every 6 (six) hours. 08/20/17  Yes Lavina Hamman, MD  levothyroxine (SYNTHROID, LEVOTHROID) 175 MCG tablet TAKE 1 TABLET (175 MCG TOTAL) BY MOUTH DAILY BEFORE BREAKFAST. 12/31/17  Yes [name], PA-C  loratadine-pseudoephedrine (CLARITIN-D 24-HOUR) 10-240 MG per 24 hr tablet Take 1 tablet by mouth as needed for allergies.    Yes [provider]  Magnesium Chloride (MAGNESIUM DR PO) Take 1 capsule by mouth daily.    Yes [provider]  metFORMIN (GLUCOPHAGE-XR) 500 MG 24 hr tablet TAKE 1 TABLET (500 MG TOTAL) BY MOUTH 4 (FOUR) TIMES DAILY - AFTER MEALS AND AT BEDTIME. Patient taking differently: Take 1,000 mg by mouth 2 (two) times daily.  02/25/18  Yes Unk Pinto, MD  metolazone (ZAROXOLYN) 5 MG tablet TAKE 1 PILL 1 HOUR BEFORE THE LASIX DAILY AS NEEDED FOR WEIGHT GAIN/EDEMA. 01/23/18  Yes Unk Pinto, MD  OVER THE COUNTER MEDICATION Slow-release iron 45 mg 2 tablets daily   Yes [provider]  OXYGEN-HELIUM IN Inhale 8 L into the lungs continuous.    Yes [provider]  polyethylene glycol (MIRALAX / GLYCOLAX) packet Take 17 g by mouth 2 (two) times daily. 08/20/17  Yes Lavina Hamman, MD  pravastatin (PRAVACHOL) 40 MG tablet TAKE 1 TABLET BY MOUTH EVERY DAY Patient taking differently: TAKE 40 mg TABLET BY MOUTH EVERY DAY 11/25/17  Yes Liane Comber, NP     Allergies:     Allergies  Allergen Reactions  . Inderal [Propranolol] Other (See Comments)    Hair loss  . Ace Inhibitors Cough     Physical Exam:   Vitals  Blood pressure 119/77, pulse (!)  107, temperature (!) 97.1 F (36.2 C), temperature source Temporal, resp. rate (!) 24, height 5\' 7"  (1.702 m), weight 105.2 kg (232 lb), SpO2 92 %.   1. General  lying in bed in NAD,   2. Normal affect and insight, Not Suicidal or Homicidal, Awake Alert, Oriented X 3.  3. No F.N deficits, ALL C.Nerves Intact, Strength 5/5 all 4 extremities, Sensation intact all 4 extremities, Plantars down going.  4. Ears and Eyes appear Normal, Conjunctivae clear, PERRLA. Moist Oral Mucosa.  5. Supple Neck, No JVD, No cervical lymphadenopathy appriciated, No Carotid Bruits.  6. Symmetrical Chest wall movement,tight, slight crackles right lung base, slight bilateral exp wheezing.   7. RRR, No Gallops, Rubs or Murmurs, No Parasternal Heave.  8. Positive Bowel Sounds, Abdomen Soft, No tenderness, No organomegaly appriciated,No rebound -guarding or rigidity.  9.  No Cyanosis, Normal Skin Turgor, No Skin Rash or Bruise.  10. Good muscle tone,  joints appear normal , no effusions, Normal ROM.  11. No Palpable Lymph Nodes in Neck or Axillae     Data Review:    CBC Recent Labs  Lab 04/11/2018 1750 03/22/2018 1755  WBC 11.1*  --   HGB 13.9 16.7*  HCT 50.3* 49.0*  PLT 407*  --   MCV 93.8  --   MCH 25.9*  --   MCHC 27.6*  --   RDW 16.2*  --   LYMPHSABS 0.9  --   MONOABS 0.8  --   EOSABS 0.1  --   BASOSABS 0.0  --    ------------------------------------------------------------------------------------------------------------------  Chemistries  Recent Labs  Lab 03/18/2018 1750 03/27/2018  1755  NA 137 136  K 5.4* 5.3*  CL 99 102  CO2 20*  --   GLUCOSE 193* 188*  BUN 40* 41*  CREATININE 2.13* 1.90*  CALCIUM 9.5  --   AST 23  --   ALT 9  --   ALKPHOS 70  --   BILITOT 0.7  --    ------------------------------------------------------------------------------------------------------------------ estimated creatinine clearance is 33.4 mL/min (A) (by C-G formula based on SCr of 1.9 mg/dL  (H)). ------------------------------------------------------------------------------------------------------------------ No results for input(s): TSH, T4TOTAL, T3FREE, THYROIDAB in the last 72 hours.  Invalid input(s): FREET3  Coagulation profile No results for input(s): INR, PROTIME in the last 168 hours. ------------------------------------------------------------------------------------------------------------------- No results for input(s): DDIMER in the last 72 hours. -------------------------------------------------------------------------------------------------------------------  Cardiac Enzymes Recent Labs  Lab 03/26/2018 1750  TROPONINI <0.03   ------------------------------------------------------------------------------------------------------------------    Component Value Date/Time   BNP 1,054.1 (H) 04/10/2018 1750   BNP 727 (H) 11/10/2017 1620     ---------------------------------------------------------------------------------------------------------------  Urinalysis    Component Value Date/Time   COLORURINE YELLOW 07/27/2015 1612   APPEARANCEUR CLEAR 07/27/2015 1612   LABSPEC 1.017 07/27/2015 1612   PHURINE 6.0 07/27/2015 1612   GLUCOSEU NEGATIVE 07/27/2015 1612   HGBUR 2+ (A) 07/27/2015 1612   BILIRUBINUR NEGATIVE 07/27/2015 1612   KETONESUR NEGATIVE 07/27/2015 1612   PROTEINUR NEGATIVE 07/27/2015 1612   UROBILINOGEN 1.0 10/23/2009 1145   NITRITE NEGATIVE 07/27/2015 1612   LEUKOCYTESUR NEGATIVE 07/27/2015 1612    ----------------------------------------------------------------------------------------------------------------   Imaging Results:    Dg Chest Port 1 View  Result Date: 04/12/2018 CLINICAL DATA:  Shortness of breath. EXAM: PORTABLE CHEST 1 VIEW COMPARISON:  Chest x-ray dated December 13, 2017. FINDINGS: The patient's chin obscures the upper thorax. Stable cardiomegaly and diffuse interstitial coarsening. Patchy opacities at the right lung base  with possible small right pleural effusion. No pneumothorax. No acute osseous abnormality. Prominent distention of the stomach. IMPRESSION: 1. Right lower lobe atelectasis versus pneumonia. Possible small right pleural effusion. 2. Stable cardiomegaly and pulmonary interstitial edema. Electronically Signed   By: Titus Dubin M.D.   On: 03/27/2018 18:16    Ekg aflutter at 100, LAD, RBBB with LPFB    Assessment & Plan:    Principal Problem:   Acute respiratory failure ([place]) Active Problems:   OSA and COPD overlap syndrome (HCC)   ARF (acute renal failure) (HCC)   Hyperkalemia    Acute respiratory failure (hypoxic, hypercarbic) Secondary to Copd, pneumonia  ddx mild CHF Blood culture x2 Urine strep antigen Urine legionella antigen Resp viral panel vanco iv, cefepime iv pharmacy to dose Solumedrol 80mg  iv q8h spiriva 1puff qday Albuterol neb q6h and q6h prn  Bipap  Acute Renal Failure STOP Metolazne Check CT abd/ pelvis Check cmp in am  LUQ pain Check CT abd/ pelvis protonix 40mg  po qday  Tachycardia , h/o Afib,  ? Aflutter Tele Trop I q6h x3 Check TSH Assume not on Anticoagulation due to h/o anemia, heme positive stool 2018, though discharge summary states discharged on coumadin, but then on discharge summary April 2019 states cont aspirin for anticoagulation due to h/o GI bleeding in the past Cont Aspirin 81mg  po qday Cont Atenolol, Cardizem, see below  CHF (EF 55%) Cont Atenolol 50mg  po qday Cont Cardizem 300mg  po qday Cont Lasix 80mg  po qday  Dm2 STOP Metformin fsbs and qhs, ISS  Hypothyroidism Cont levothyroxine 175 micrograms po qday  Anxiety Cont Cymbalta 60mg  po qday Cont Valium 5mg  po bid prn    DVT Prophylaxis  Lovenox - SCDs  AM Labs Ordered, also please review Full Orders  Family Communication: Admission, patients condition and plan of care including tests being ordered have been discussed with the patient and son  who indicate  understanding and agree with the plan and Code Status.  Code Status DNR  Likely DC to  home  Condition GUARDED   Consults called: none  Admission status: inpatient  Time spent in minutes : 70    Jani Gravel M.D on 04/07/2018 at 7:44 PM  Between 7am to 7pm - Pager - [phone]  . After 7pm go to [email or web address] - password [facility]  [facility] - Office  [phone]

## 2018-03-24 NOTE — Telephone Encounter (Signed)
Son called and reported the home health nurse is visiting the patient.  The nurse states the patient is more short of breath, has fluid in her lungs and her O2sat is 84 % on O2 at 8 lpm.  Per Dr Melford Aase, the patient needs to go to the ED.  The son is aware.

## 2018-03-25 ENCOUNTER — Inpatient Hospital Stay (HOSPITAL_COMMUNITY): Payer: PPO

## 2018-03-25 DIAGNOSIS — J9600 Acute respiratory failure, unspecified whether with hypoxia or hypercapnia: Secondary | ICD-10-CM

## 2018-03-25 DIAGNOSIS — J449 Chronic obstructive pulmonary disease, unspecified: Secondary | ICD-10-CM

## 2018-03-25 DIAGNOSIS — Z515 Encounter for palliative care: Secondary | ICD-10-CM

## 2018-03-25 DIAGNOSIS — J9621 Acute and chronic respiratory failure with hypoxia: Secondary | ICD-10-CM

## 2018-03-25 DIAGNOSIS — A419 Sepsis, unspecified organism: Principal | ICD-10-CM

## 2018-03-25 DIAGNOSIS — N179 Acute kidney failure, unspecified: Secondary | ICD-10-CM

## 2018-03-25 DIAGNOSIS — E119 Type 2 diabetes mellitus without complications: Secondary | ICD-10-CM

## 2018-03-25 DIAGNOSIS — J9601 Acute respiratory failure with hypoxia: Secondary | ICD-10-CM

## 2018-03-25 DIAGNOSIS — J441 Chronic obstructive pulmonary disease with (acute) exacerbation: Secondary | ICD-10-CM

## 2018-03-25 DIAGNOSIS — J9622 Acute and chronic respiratory failure with hypercapnia: Secondary | ICD-10-CM

## 2018-03-25 DIAGNOSIS — Z7189 Other specified counseling: Secondary | ICD-10-CM

## 2018-03-25 DIAGNOSIS — I998 Other disorder of circulatory system: Secondary | ICD-10-CM

## 2018-03-25 DIAGNOSIS — I10 Essential (primary) hypertension: Secondary | ICD-10-CM

## 2018-03-25 DIAGNOSIS — J181 Lobar pneumonia, unspecified organism: Secondary | ICD-10-CM

## 2018-03-25 DIAGNOSIS — Z66 Do not resuscitate: Secondary | ICD-10-CM

## 2018-03-25 LAB — COMPREHENSIVE METABOLIC PANEL
ALT: 8 U/L (ref 0–44)
AST: 16 U/L (ref 15–41)
Albumin: 3.6 g/dL (ref 3.5–5.0)
Alkaline Phosphatase: 63 U/L (ref 38–126)
Anion gap: 17 — ABNORMAL HIGH (ref 5–15)
BILIRUBIN TOTAL: 0.8 mg/dL (ref 0.3–1.2)
BUN: 48 mg/dL — AB (ref 8–23)
CHLORIDE: 101 mmol/L (ref 98–111)
CO2: 22 mmol/L (ref 22–32)
Calcium: 9.2 mg/dL (ref 8.9–10.3)
Creatinine, Ser: 2.25 mg/dL — ABNORMAL HIGH (ref 0.44–1.00)
GFR calc Af Amer: 24 mL/min — ABNORMAL LOW (ref >60)
GFR, EST NON AFRICAN AMERICAN: 21 mL/min — AB (ref >60)
Glucose, Bld: 160 mg/dL — ABNORMAL HIGH (ref 70–99)
POTASSIUM: 5 mmol/L (ref 3.5–5.1)
Sodium: 140 mmol/L (ref 135–145)
TOTAL PROTEIN: 6.4 g/dL — AB (ref 6.5–8.1)

## 2018-03-25 LAB — BLOOD GAS, ARTERIAL
Acid-base deficit: 4.8 mmol/L — ABNORMAL HIGH (ref 0.0–2.0)
Bicarbonate: 21.7 mmol/L (ref 20.0–28.0)
DRAWN BY: 30136
Delivery systems: POSITIVE
Expiratory PAP: 6
FIO2: 90
Inspiratory PAP: 12
MODE: POSITIVE
O2 SAT: 93.4 %
PCO2 ART: 51 mmHg — AB (ref 32.0–48.0)
Patient temperature: 96
pH, Arterial: 7.241 — ABNORMAL LOW (ref 7.350–7.450)
pO2, Arterial: 73.9 mmHg — ABNORMAL LOW (ref 83.0–108.0)

## 2018-03-25 LAB — CBC
HEMATOCRIT: 45.6 % (ref 36.0–46.0)
Hemoglobin: 12.9 g/dL (ref 12.0–15.0)
MCH: 25.6 pg — ABNORMAL LOW (ref 26.0–34.0)
MCHC: 28.3 g/dL — AB (ref 30.0–36.0)
MCV: 90.7 fL (ref 78.0–100.0)
PLATELETS: 312 10*3/uL (ref 150–400)
RBC: 5.03 MIL/uL (ref 3.87–5.11)
RDW: 16.2 % — AB (ref 11.5–15.5)
WBC: 8.5 10*3/uL (ref 4.0–10.5)

## 2018-03-25 LAB — RESPIRATORY PANEL BY PCR
ADENOVIRUS-RVPPCR: NOT DETECTED
BORDETELLA PERTUSSIS-RVPCR: NOT DETECTED
CHLAMYDOPHILA PNEUMONIAE-RVPPCR: NOT DETECTED
CORONAVIRUS NL63-RVPPCR: NOT DETECTED
Coronavirus 229E: NOT DETECTED
Coronavirus HKU1: NOT DETECTED
Coronavirus OC43: NOT DETECTED
INFLUENZA A-RVPPCR: NOT DETECTED
INFLUENZA B-RVPPCR: NOT DETECTED
MYCOPLASMA PNEUMONIAE-RVPPCR: NOT DETECTED
Metapneumovirus: NOT DETECTED
PARAINFLUENZA VIRUS 4-RVPPCR: NOT DETECTED
Parainfluenza Virus 1: NOT DETECTED
Parainfluenza Virus 2: NOT DETECTED
Parainfluenza Virus 3: NOT DETECTED
RESPIRATORY SYNCYTIAL VIRUS-RVPPCR: NOT DETECTED
Rhinovirus / Enterovirus: NOT DETECTED

## 2018-03-25 LAB — GLUCOSE, CAPILLARY
GLUCOSE-CAPILLARY: 156 mg/dL — AB (ref 70–99)
GLUCOSE-CAPILLARY: 134 mg/dL — AB (ref 70–99)
GLUCOSE-CAPILLARY: 144 mg/dL — AB (ref 70–99)
Glucose-Capillary: 150 mg/dL — ABNORMAL HIGH (ref 70–99)

## 2018-03-25 LAB — HIV ANTIBODY (ROUTINE TESTING W REFLEX): HIV SCREEN 4TH GENERATION: NONREACTIVE

## 2018-03-25 LAB — TROPONIN I
Troponin I: <0.03 ng/mL (ref <0.03)
Troponin I: <0.03 ng/mL (ref <0.03)

## 2018-03-25 LAB — LACTIC ACID, PLASMA: LACTIC ACID, VENOUS: 1.5 mmol/L (ref 0.5–1.9)

## 2018-03-25 MED ORDER — BUDESONIDE 0.5 MG/2ML IN SUSP: 0.5000 mg | Freq: Two times a day (BID) | RESPIRATORY_TRACT | Status: DC

## 2018-03-25 MED ORDER — SODIUM CHLORIDE 0.9 % IV SOLN
0.5000 mg/h | INTRAVENOUS | Status: DC
  Administered 2018-03-25: 0.5 mg/h via INTRAVENOUS
  Filled 2018-03-25: qty 5

## 2018-03-25 MED ORDER — LORAZEPAM 2 MG/ML IJ SOLN
2.0000 mg | INTRAMUSCULAR | Status: DC | PRN
Start: 2018-03-25 — End: 2018-03-25
  Administered 2018-03-25: 2 mg via INTRAVENOUS
  Filled 2018-03-25: qty 1

## 2018-03-25 MED ORDER — SODIUM CHLORIDE 0.9 % IV SOLN
INTRAVENOUS | Status: DC
Start: 2018-03-25 — End: 2018-03-25
  Administered 2018-03-25: 10:00:00 via INTRAVENOUS

## 2018-03-25 MED ORDER — SODIUM CHLORIDE 0.9 % IV SOLN
3.0000 g | Freq: Two times a day (BID) | INTRAVENOUS | Status: DC
  Administered 2018-03-25: 3 g via INTRAVENOUS
  Filled 2018-03-25 (×2): qty 3

## 2018-03-25 MED ORDER — HALOPERIDOL LACTATE 5 MG/ML IJ SOLN
0.5000 mg | INTRAMUSCULAR | Status: DC | PRN
Start: 2018-03-25 — End: 2018-03-25

## 2018-03-25 MED ORDER — HYDROMORPHONE BOLUS VIA INFUSION
0.5000 mg | INTRAVENOUS | Status: DC | PRN
  Administered 2018-03-25 (×3): 0.5 mg via INTRAVENOUS
  Filled 2018-03-25: qty 1

## 2018-03-25 MED ORDER — IPRATROPIUM-ALBUTEROL 0.5-2.5 (3) MG/3ML IN SOLN
3.0000 mL | Freq: Four times a day (QID) | RESPIRATORY_TRACT | Status: DC
  Filled 2018-03-25: qty 3

## 2018-03-25 MED ORDER — ALBUTEROL SULFATE (2.5 MG/3ML) 0.083% IN NEBU: 2.5000 mg | INHALATION_SOLUTION | RESPIRATORY_TRACT | Status: DC | PRN

## 2018-03-25 MED ORDER — FAMOTIDINE IN NACL 20-0.9 MG/50ML-% IV SOLN: 20.0000 mg | Freq: Two times a day (BID) | INTRAVENOUS | Status: DC

## 2018-03-25 MED ORDER — LEVOTHYROXINE SODIUM 100 MCG IV SOLR
100.0000 ug | Freq: Every day | INTRAVENOUS | Status: DC
  Administered 2018-03-25: 100 ug via INTRAVENOUS
  Filled 2018-03-25: qty 5

## 2018-03-25 MED ORDER — GLYCOPYRROLATE 0.2 MG/ML IJ SOLN: 0.2000 mg | INTRAMUSCULAR | Status: DC | PRN

## 2018-03-25 MED ORDER — FAMOTIDINE IN NACL 20-0.9 MG/50ML-% IV SOLN
20.0000 mg | INTRAVENOUS | Status: DC
  Administered 2018-03-25: 20 mg via INTRAVENOUS
  Filled 2018-03-25: qty 50

## 2018-03-25 MED ORDER — LORAZEPAM 2 MG/ML IJ SOLN
INTRAMUSCULAR | Status: AC
  Administered 2018-03-25: 0.5 mg via INTRAVENOUS
  Filled 2018-03-25: qty 1

## 2018-03-25 MED ORDER — HYDROMORPHONE HCL 1 MG/ML IJ SOLN
1.0000 mg | INTRAMUSCULAR | Status: AC
  Administered 2018-03-25: 1 mg via INTRAVENOUS
  Filled 2018-03-25: qty 1

## 2018-03-25 MED ORDER — LORAZEPAM 2 MG/ML IJ SOLN
0.5000 mg | Freq: Four times a day (QID) | INTRAMUSCULAR | Status: DC | PRN
  Administered 2018-03-25: 0.5 mg via INTRAVENOUS

## 2018-03-26 ENCOUNTER — Ambulatory Visit: Payer: PPO

## 2018-03-29 ENCOUNTER — Other Ambulatory Visit: Payer: Self-pay | Admitting: Internal Medicine

## 2018-03-29 ENCOUNTER — Other Ambulatory Visit: Payer: Self-pay | Admitting: Physician Assistant

## 2018-03-30 LAB — CULTURE, BLOOD (ROUTINE X 2)
CULTURE: NO GROWTH
Culture: NO GROWTH
SPECIAL REQUESTS: ADEQUATE
SPECIAL REQUESTS: ADEQUATE

## 2018-03-31 ENCOUNTER — Ambulatory Visit: Payer: PPO | Admitting: Cardiovascular Disease

## 2018-04-02 DIAGNOSIS — Z7189 Other specified counseling: Secondary | ICD-10-CM

## 2018-04-02 DIAGNOSIS — Z515 Encounter for palliative care: Secondary | ICD-10-CM

## 2018-04-16 NOTE — Consult Note (Signed)
Consultation Note Date: Mar 29, 2018   Patient Name: Jillian Wood  DOB: 1946-01-16  MRN: [id number]  Age / Sex: 72 y.o., female  PCP: Unk Pinto, MD Referring Physician: Samuella Cota, MD  Reason for Consultation: Establishing goals of care  HPI/Patient Profile: 72 y.o. female  with past medical history of end stage COPD, chronic respiratory failure on 8L o2 at home, bipap nightly, diastolic HF, pulmonary HTN, DM afib admitted on 03/26/2018 with SOB, confusion and hypoxia. Workup reveals possible RLL pneumonia, hypoxic and hypercarbic respiratory failure, and cyanotic toes. Patient is failing trials to wean off bipap during the day. She has been followed outpatient by [facility]. Palliative medicine consulted for [place].   Clinical Assessment and Goals of Care:  I have reviewed medical records including EPIC notes, labs and imaging, received report from patient's nurse, assessed the patient and then met at the bedside along with her three sons to discuss diagnosis prognosis, GOC, EOL wishes, disposition and options. The patient was not able to participate in [place] meeting.   I introduced Palliative Medicine as specialized medical care for people living with serious illness. It focuses on providing relief from the symptoms and stress of a serious illness. The goal is to improve quality of life for both the patient and the family.  We discussed a brief life review of the patient. She has been living in the home with her sons. Her spouse died almost exactly a year ago and this was very hard on her.   As far as functional and nutritional status- there has been significant decline. Prior to admission she was only able to move from her bed to the bedside commode and back to bed. She was eating and drinking very little.     We discussed their current illness and what it means in the larger  context of their on-going co-morbidities.  Natural disease trajectory and expectations at EOL were discussed. Her sons state that they all understand she is at the end stages of her illness. Her hopes had been to do die at home or at the residential hospice house in [place] where her husband died. I am concerned she would not survive a transfer to Hospice.   The difference between aggressive medical intervention and comfort care was considered in light of the patient's goals of care. Her sons explicitly state that her [place] would be comfort only. I discussed with them this would entail discontinuing IV fluids, antibiotics and other medications and interventions intended to prolong her life. The [place] would be to support her comfortably through dying process.   Advanced directives, concepts specific to code status, artifical feeding and hydration, and rehospitalization were considered and discussed. Patient has DNR in place. No artificial feeding or hydration will be provided with full comfort care.   Questions and concerns were addressed. The family was encouraged to call with questions or concerns.   Primary Decision Maker Patient's sons    SUMMARY OF RECOMMENDATIONS -Transition to comfort care -'1mg'$  hydromorphone now for  SOB -Hydromorphone infusion at .'5mg'$ /hr (avoid morphine d/t GFR <30) with '1mg'$  bolus q10 min prn for SOB or air hunger -Lorazepam '2mg'$  q4hr prn    Code Status/Advance Care Planning:  DNR  Palliative Prophylaxis:   Frequent Pain Assessment  Additional Recommendations (Limitations, Scope, Preferences):  Full Comfort Care  Psycho-social/Spiritual:   Desire for further Chaplaincy support:no  Additional Recommendations: Grief/Bereavement Support  Prognosis:    Hours - Days  Discharge Planning: Anticipated Hospital Death  Primary Diagnoses: Present on Admission: . (Resolved) Acute respiratory failure ([place]) . (Resolved) ARF (acute renal failure) ([place]) . (Resolved)  Hyperkalemia . OSA and COPD overlap syndrome ([place]) . Morbid obesity ([place]) . Hypothyroidism . COPD (chronic obstructive pulmonary disease) ([place]) . Pulmonary hypertension ([place]) . Chronic respiratory failure with hypercapnia ([place]) . DNR (do not resuscitate)   I have reviewed the medical record, interviewed the patient and family, and examined the patient. The following aspects are pertinent.  Past Medical History:  Diagnosis Date  . Arthritis   . CHF (congestive heart failure) ([place])   . COPD (chronic obstructive pulmonary disease) ([place])   . Depression   . Diabetes mellitus type 2 in obese ([place])   . GERD (gastroesophageal reflux disease)   . Hyperlipidemia   . Hypertension   . Morbid obesity ([place])   . OAB (overactive bladder)   . PSVT (paroxysmal supraventricular tachycardia) ([place])   . Thyroid disease   . Vitamin D deficiency    Social History   Socioeconomic History  . Marital status: Widowed    Spouse name: Not on file  . Number of children: Not on file  . Years of education: Not on file  . Highest education level: Not on file  Occupational History  . Occupation: retired  Scientific laboratory technician  . Financial resource strain: Not on file  . Food insecurity:    Worry: Not on file    Inability: Not on file  . Transportation needs:    Medical: Not on file    Non-medical: Not on file  Tobacco Use  . Smoking status: Former Smoker    Packs/day: 0.50    Years: 44.00    Pack years: 22.00    Types: Cigarettes    Last attempt to quit: 07/31/2016    Years since quitting: 1.6  . Smokeless tobacco: Never Used  Substance and Sexual Activity  . Alcohol use: No    Alcohol/week: 0.0 oz  . Drug use: No  . Sexual activity: Not on file  Lifestyle  . Physical activity:    Days per week: Not on file    Minutes per session: Not on file  . Stress: Not on file  Relationships  . Social connections:    Talks on phone: Not on file    Gets together: Not on file    Attends religious service:  Not on file    Active member of club or organization: Not on file    Attends meetings of clubs or organizations: Not on file    Relationship status: Not on file  Other Topics Concern  . Not on file  Social History Narrative  . Not on file   Family History  Problem Relation Age of Onset  . Alcohol abuse Father   . Heart disease Father    Scheduled Meds: . aspirin EC  81 mg Oral Daily  . budesonide (PULMICORT) nebulizer solution  0.5 mg Nebulization BID  . chlorhexidine  15 mL Mouth Rinse BID  . diltiazem  300 mg  Oral Daily  . DULoxetine  60 mg Oral Daily  . enoxaparin (LOVENOX) injection  40 mg Subcutaneous Q24H  . guaiFENesin  600 mg Oral BID  . insulin aspart  0-9 Units Subcutaneous Q4H  . ipratropium-albuterol  3 mL Nebulization Q6H  . levothyroxine  100 mcg Intravenous Daily  . mouth rinse  15 mL Mouth Rinse q12n4p  . methylPREDNISolone (SOLU-MEDROL) injection  80 mg Intravenous Q8H  . polyethylene glycol  17 g Oral BID  . sodium chloride flush  3 mL Intravenous Q12H   Continuous Infusions: . sodium chloride    . sodium chloride 125 mL/hr at April 03, 2018 1021  . ampicillin-sulbactam (UNASYN) IV 3 g (03-Apr-2018 1212)  . famotidine (PEPCID) IV 20 mg (04/03/2018 1040)   PRN Meds:.sodium chloride, acetaminophen-codeine, albuterol, LORazepam, ondansetron (ZOFRAN) IV, sodium chloride flush Medications Prior to Admission:  Prior to Admission medications   Medication Sig Start Date End Date Taking? Authorizing Provider  acetaminophen-codeine (TYLENOL #3) 300-30 MG tablet Take 1 tablet by mouth every 8 (eight) hours as needed for moderate pain or severe pain. 01/07/18  Yes [name], PA-C  acetaZOLAMIDE (DIAMOX) 250 MG tablet TAKE 1 TABLET (250 MG TOTAL) BY MOUTH 3 (THREE) TIMES DAILY. Patient taking differently: Take 250-500 mg by mouth See admin instructions. Take 500 mg in the morning and 250 mg in the evening 01/27/18  Yes Liane Comber, NP  albuterol (PROVENTIL HFA;VENTOLIN  HFA) 108 (90 Base) MCG/ACT inhaler Inhale 1-2 puffs into the lungs every 6 (six) hours as needed for wheezing or shortness of breath. 05/30/17  Yes [name], PA-C  albuterol (PROVENTIL) (2.5 MG/3ML) 0.083% nebulizer solution Take 3 mLs (2.5 mg total) by nebulization every 6 (six) hours as needed for wheezing or shortness of breath. 11/10/17  Yes [name], PA-C  aspirin 81 MG tablet Take 81 mg by mouth daily.   Yes [provider]  atenolol (TENORMIN) 50 MG tablet TAKE 1 TABLET BY MOUTH EVERY DAY 12/31/17  Yes [name], PA-C  budesonide (PULMICORT) 0.5 MG/2ML nebulizer solution Take 2 mLs (0.5 mg total) by nebulization 2 (two) times daily. 03/20/17  Yes [name], PA-C  Cholecalciferol (VITAMIN D PO) Take 2,000 Units by mouth daily.    Yes [provider]  diazepam (VALIUM) 5 MG tablet Take one tablet up to 2 x a daily for anxiety AS NEEDED 03/20/17  Yes [name], PA-C  diltiazem (CARDIZEM CD) 300 MG 24 hr capsule TAKE 1 CAPSULE (300 MG TOTAL) BY MOUTH DAILY. 06/21/17  Yes Unk Pinto, MD  DULoxetine (CYMBALTA) 60 MG capsule TAKE 1 CAPSULE BY MOUTH DAILY Patient taking differently: TAKE 60 mg  CAPSULE BY MOUTH DAILY 08/30/17  Yes Unk Pinto, MD  famotidine (PEPCID) 20 MG tablet Take 20 mg by mouth 2 (two) times daily. 12/29/17  Yes [provider]  fluticasone (FLONASE) 50 MCG/ACT nasal spray Place 2 sprays into both nostrils daily. Patient taking differently: Place 2 sprays into both nostrils daily as needed for allergies.  10/29/16  Yes Forcucci, Courtney, PA-C  furosemide (LASIX) 80 MG tablet Take 1 tablet (80 mg total) by mouth daily. 08/12/17  Yes Unk Pinto, MD  guaiFENesin (MUCINEX) 600 MG 12 hr tablet Take 600 mg by mouth 2 (two) times daily.    Yes [provider]  ipratropium (ATROVENT) 0.02 % nebulizer solution Take 2.5 mLs (0.5 mg total) by nebulization every 6 (six) hours. 08/20/17  Yes Lavina Hamman, MD    levothyroxine (SYNTHROID, LEVOTHROID) 175 MCG  tablet TAKE 1 TABLET (175 MCG TOTAL) BY MOUTH DAILY BEFORE BREAKFAST. 12/31/17  Yes [name], PA-C  loratadine-pseudoephedrine (CLARITIN-D 24-HOUR) 10-240 MG per 24 hr tablet Take 1 tablet by mouth as needed for allergies.    Yes [provider]  Magnesium Chloride (MAGNESIUM DR PO) Take 1 capsule by mouth daily.    Yes [provider]  metFORMIN (GLUCOPHAGE-XR) 500 MG 24 hr tablet TAKE 1 TABLET (500 MG TOTAL) BY MOUTH 4 (FOUR) TIMES DAILY - AFTER MEALS AND AT BEDTIME. Patient taking differently: Take 1,000 mg by mouth 2 (two) times daily.  02/25/18  Yes Unk Pinto, MD  metolazone (ZAROXOLYN) 5 MG tablet TAKE 1 PILL 1 HOUR BEFORE THE LASIX DAILY AS NEEDED FOR WEIGHT GAIN/EDEMA. 01/23/18  Yes Unk Pinto, MD  OVER THE COUNTER MEDICATION Slow-release iron 45 mg 2 tablets daily   Yes [provider]  OXYGEN-HELIUM IN Inhale 8 L into the lungs continuous.    Yes [provider]  polyethylene glycol (MIRALAX / GLYCOLAX) packet Take 17 g by mouth 2 (two) times daily. 08/20/17  Yes Lavina Hamman, MD  pravastatin (PRAVACHOL) 40 MG tablet TAKE 1 TABLET BY MOUTH EVERY DAY Patient taking differently: TAKE 40 mg TABLET BY MOUTH EVERY DAY 11/25/17  Yes Liane Comber, NP   Allergies  Allergen Reactions  . Inderal [Propranolol] Other (See Comments)    Hair loss  . Ace Inhibitors Cough   Review of Systems  Physical Exam  Vital Signs: BP 117/66   Pulse (!) 107   Temp 97.7 F (36.5 C) (Axillary)   Resp (!) 22   Ht '5\' 7"'$  (1.702 m)   Wt 105.2 kg (232 lb)   SpO2 92%   BMI 36.34 kg/m  Pain Scale: Faces   Pain Score: 0-No pain   SpO2: SpO2: 92 % O2 Device:SpO2: 92 % O2 Flow Rate: .   IO: Intake/output summary:   Intake/Output Summary (Last 24 hours) at April 17, 2018 1458 Last data filed at 17-Apr-2018 1321 Gross per 24 hour  Intake 837.74 ml  Output 150 ml  Net 687.74 ml    LBM: Last BM Date:  (UTA) Baseline Weight: Weight: 105.2 kg (232 lb) Most recent weight: Weight: 105.2 kg (232 lb)     Palliative Assessment/Data: PPS: 10%     Thank you for this consult. Palliative medicine will continue to follow and assist as needed.   Time In: 1330 Time Out: 1500 Time Total: 90 minutes Prolonged services billed: yes Greater than 50%  of this time was spent counseling and coordinating care related to the above assessment and plan.  Signed by: Mariana Kaufman, AGNP-C Palliative Medicine    Please contact Palliative Medicine Team phone at [phone] for questions and concerns.  For individual provider: See Shea Evans

## 2018-04-16 NOTE — Progress Notes (Addendum)
PROGRESS NOTE  Jillian Wood [id number] DOB: [birth year] DOA: 03/24/2018 PCP: Unk Pinto, MD  Brief Narrative: 57yow PMH endstage COPD on 8L , DM, CHF, OSA presented with confusion, swelling, SOB, resp distress. Admitted for acute/chronic resp failure, COPD, pneumonia, CHF?  Assessment/Plan Acute on chronic hypoxic/hypercapnic respiratory failure with acute resp acidosis. CXR with RLL infiltrate, improved pulm edema. Followed by Dr. Halford Chessman, seen 12/2017, referred to palliative care at that time. --failed trial of BiPAP with desat immediately to 20s. --will recheck ABG to assess response --continue BiPAP continuous; if improves will transition to BiPAP QHS and PRN during day and home oxygen 8-10 liters --will ask pulmonologist for further recs --very awake and anxious, on Valium at home. Try low dose Ativan.  Possible sepsis secondary to right LL lobar pneumonia --think minimal lactate elevation secondary to AKI rather than sepsis, however has AG metabolic acidosis --repeat lactic acid --change abx to Unasyn to cover anaerobes. MRSA screen negative. Stop vanc.  Endstage COPD, chronic bronchitis, chronic hypoxic resp failure on 8L home oxygen --tx as above, will continue nebs, steroids  OSA on CPAP at home, pulmonary hypertension --continue BiPAP  AKI superimposed on CKD stage III. Holding metolazone.  --looks dry. Will give fluids, trend BMP, hold Lasix  Atrial fibrillation/flutter not on anticoagulation secondary to h/o GIB. On ASA 81 daily per cardiology.  --troponins negative --f/u repeat EKG given poor quality of initial study   Chronic diastolic CHF --appears dry, will hold diuretic  --due for f/u with cardiology this month.  DM type 2. Hold metformin. --CBG stable, <200, no evidence of DKA. Continue SSI.  PSVT --stable  Morbid obesity --dietician consult when improved  Bilateral toes cyanosis. Asymptomatic. Appears chronic. --check ABI, will ask VVS to  see. Doubt acute issue.   Patient appears critically ill, prognosis guarded. Will consult PMT for assistance with GOC. May not survive this hospitalization.  DVT prophylaxis: enoxaparin Code Status: DNR Family Communication: none Disposition Plan: uncertain. Condition guarded.    Murray Hodgkins, MD  [facility] Direct contact: [phone] --Via amion app OR  --[email or web address]; password [email or web address]  7PM-7AM contact night coverage as above 04/11/18, 10:00 AM  LOS: 1 day   Consultants:    Procedures:    Antimicrobials:  Cefepime 7/9   Unasyn 7/10 >>  Vancomycin 7/9   Interval history/Subjective: Asks for water. Breathing better. No abd pain. No n/v.  Objective: Vitals:  Vitals:   2018/04/11 0756 04/11/18 0913  BP: 119/84 105/66  Pulse: (!) 106 (!) 104  Resp: (!) 22 19  Temp: 98 F (36.7 C)   SpO2: 92% 95%    Exam:  Constitutional:  . Appears anxious, mildly restless, but follows commands, appears ill but not toxic Eyes:  . pupils and irises appear normal . Normal lids  ENMT:  . grossly normal hearing  Respiratory:  . CTA bilaterally, no w/r/r. Poor air movement. Decreased breath sounds. Marland Kitchen Respiratory effort moderate to severely increased.  Cardiovascular:  . RRR, no m/r/g . No LE extremity edema   . Bilateral feet with cyanosis at toes Abdomen:  . Abdomen appears normal; no tenderness or masses Musculoskeletal:  . RUE, LUE, RLE, LLE   o strength and tone normal, no atrophy, no abnormal movements o No tenderness, masses Skin:  . No rashes, lesions, ulcers Psychiatric:  . Mental status o Affect anxious  I have personally reviewed the following:   Labs:  BUN 14 > 48  Creatinine 1.9 > 2.25  Troponins negative  Lactic acid  2.1  CBC unremarkable  Imaging studies:  CXR noted above  Medical tests:  EKG on admit very poor quality, aflutter?, chronic RBBB   Test discussed with performing physician:    Decision to obtain old  records:    Review and summation of old records:  Echo 08/14/2017: LVEF 55-60%. Mild to moderate reduction RV systolic fxn.   Scheduled Meds: . aspirin EC  81 mg Oral Daily  . chlorhexidine  15 mL Mouth Rinse BID  . diltiazem  300 mg Oral Daily  . DULoxetine  60 mg Oral Daily  . enoxaparin (LOVENOX) injection  40 mg Subcutaneous Q24H  . guaiFENesin  600 mg Oral BID  . insulin aspart  0-9 Units Subcutaneous Q4H  . ipratropium-albuterol  3 mL Nebulization Q6H  . levothyroxine  100 mcg Intravenous Daily  . mouth rinse  15 mL Mouth Rinse q12n4p  . methylPREDNISolone (SOLU-MEDROL) injection  80 mg Intravenous Q8H  . polyethylene glycol  17 g Oral BID  . sodium chloride flush  3 mL Intravenous Q12H   Continuous Infusions: . sodium chloride    . sodium chloride    . famotidine (PEPCID) IV      Principal Problem:   Acute on chronic respiratory failure with hypoxia and hypercapnia (HCC) Active Problems:   Morbid obesity (HCC)   Hypothyroidism   COPD (chronic obstructive pulmonary disease) (HCC)   Pulmonary hypertension (HCC)   OSA and COPD overlap syndrome ([place])   DNR (do not resuscitate)   Chronic respiratory failure with hypercapnia ([place])   Lobar pneumonia ([place])   Sepsis ([place])   AKI (acute kidney injury) ([place])   DM type 2 (diabetes mellitus, type 2) ([place])   LOS: 1 day

## 2018-04-16 NOTE — Consult Note (Signed)
   [facility] CM Inpatient Consult   04/21/2018  Jillian Wood [birth year] [id number]    Received call from [facility] with [facility] ( outpatient home based palliative care program administered by [facility]) to inform that Jillian Wood is active with [facility]. They will continue to follow [name] post discharge if she returns home.  Spoke with inpatient RNCM to make aware of above notes.   Marthenia Rolling, MSN-Ed, RN,BSN [facility] Liaison [phone]

## 2018-04-16 NOTE — Progress Notes (Signed)
CRITICAL VALUE ALERT  Critical Value: Lactic acid- 2.1  Date & Time Notied: 2307 03/26/2018  Provider Notified: NP Schorr  Orders Received/Actions taken: No new orders at this time

## 2018-04-16 NOTE — Care Management Note (Signed)
Case Management Note  Patient Details  Name: KIIRA BRACH MRN: [id number] Date of Birth: 10-28-1945  Subjective/Objective:   From home, active with [facility], presents with acute resp failure on bipap, has ESCOPD, palliative consulted, his feet are blue per RN, but has pulses.                   Action/Plan: NCM will cont to follow for dc needs.  Expected Discharge Date:                  Expected Discharge Plan:     In-House Referral:     Discharge planning Services  CM Consult  Post Acute Care Choice:    Choice offered to:     DME Arranged:    DME Agency:     HH Arranged:    HH Agency:     Status of Service:  In process, will continue to follow  If discussed at Long Length of Stay Meetings, dates discussed:    Additional Comments:  Zenon Mayo, RN 2018/04/16, 12:47 PM

## 2018-04-16 NOTE — Progress Notes (Signed)
Pharmacy Antibiotic Note  Jillian Wood is a 72 y.o. female admitted on 03/18/2018 with pneumonia.  Pharmacy has been consulted for Unasyn dosing  Day #2 of abx for PNA. CXR R lower lobe atelectasis vs PNA. MRSA PCR negative. Afebrile, WBC down to wnl.  Plan: Stop vancomycin and cefepime Start Unasyn 3g IV Q12h Monitor clinical picture, renal function F/U C&S, abx deescalation / LOT  Height: 5\' 7"  (170.2 cm) Weight: 232 lb (105.2 kg) IBW/kg (Calculated) : 61.6  Temp (24hrs), Avg:97.8 F (36.6 C), Min:97.1 F (36.2 C), Max:98.6 F (37 C)  Recent Labs  Lab 03/31/2018 1750 04/01/2018 1755 03/24/2018 2212 04/13/18 0712  WBC 11.1*  --   --  8.5  CREATININE 2.13* 1.90*  --  2.25*  LATICACIDVEN  --  7.59* 2.1*  --     Estimated Creatinine Clearance: 28.2 mL/min (A) (by C-G formula based on SCr of 2.25 mg/dL (H)).    Allergies  Allergen Reactions  . Inderal [Propranolol] Other (See Comments)    Hair loss  . Ace Inhibitors Cough     Thank you for allowing pharmacy to be a part of this patient's care.  Reginia Naas 04-13-18 10:04 AM

## 2018-04-16 NOTE — Death Summary Note (Signed)
Death Summary  Jillian Wood [id number] DOB: 1945-11-10 DOA: 2018/04/09  PCP: Unk Pinto, MD  Admit date: Apr 09, 2018 Date of Death: Apr 10, 2018  Final Diagnoses:  1. Acute on chronic hypoxic/hypercapnic respiratory failure with acute resp acidosis. 2. Sepsis secondary to right LL lobar pneumonia 3. Endstage COPD, chronic bronchitis, chronic hypoxic resp failure on 8L home oxygen 4. OSA on CPAP 5. AKI superimposed on CKD stage III 6. Atrial fibrillation/flutter  7. Chronic diastolic CHF 8. DM type 2 9. Morbid obesity   History of present illness:  Brief Narrative: 57yow PMH endstage COPD on 8L [place] (referred to outpatient palliative by primary pulmonologist), DM, CHF, OSA presented with confusion, swelling, SOB, resp distress. Admitted for acute/chronic resp failure, COPD, pneumonia.  Hospital Course:  Patient was treated with IV abx for pneumonia, BiPAP, steroids, bronchodilators but failed to improve and was unable to tolerate removal of the BiPAP mask without immediate hypoxia. She was seen by pulmonary medicine and palliative care, and given failure to improve, and family's stated goals of care, the patient was made full comfort care and subsequently died approximately 1754.  Acute on chronic hypoxic/hypercapnic respiratory failure with acute resp acidosis. CXR with RLL infiltrate, improved pulm edema. Followed by Dr. Halford Chessman, seen 12/2017, referred to palliative care at that time. --failed trial of BiPAP with desat immediately to 49s.  Sepsis secondary to right LL lobar pneumonia --repeat lactic acid was WNL: --changed abx to Unasyn to cover anaerobes.    Endstage COPD, chronic bronchitis, chronic hypoxic resp failure on 8L home oxygen  OSA on CPAP at home, pulmonary hypertension  AKI superimposed on CKD stage III. Holding metolazone.  --appeared dry. Treated with fluids. Lasix held.  Atrial fibrillation/flutter not on anticoagulation secondary to h/o GIB. On ASA 81  daily per cardiology.  --troponins negative   Chronic diastolic CHF --appeared dry, held diuretic  --was due for f/u with cardiology this month.  DM type 2. Hold metformin. --CBG stable, <200, no evidence of DKA.     Time: 40 minutes  Signed:  Murray Hodgkins  [facility] 2018-04-10, 7:10 PM

## 2018-04-16 NOTE — Progress Notes (Signed)
VASCULAR LAB PRELIMINARY  ARTERIAL  ABI completed:    RIGHT    LEFT    PRESSURE WAVEFORM  PRESSURE WAVEFORM  BRACHIAL  T BRACHIAL  T  DP  DM DP  M  AT   AT    PT  M PT  M  PER   PER    GREAT TOE  NA GREAT TOE  NA    RIGHT LEFT  ABI - -   Unable to obtain ABI due to patient inability to tolerate pressure, waveforms are abnormal   Jillian Wood 04-10-2018, 2:48 PM

## 2018-04-16 NOTE — Progress Notes (Signed)
TOD 1754.  Four rings were removed from pt's hands and given to family.  Four were gold-colored bands and one was a gold-colored ring with clear stone.

## 2018-04-16 NOTE — Consult Note (Signed)
Name: Jillian Wood MRN: [id number] DOB: Feb 06, 1946    ADMISSION DATE:  03/31/2018 CONSULTATION DATE:  03-30-2018  REFERRING MD :  Dr. Sarajane Jews   CHIEF COMPLAINT:  SOB  HISTORY OF PRESENT ILLNESS:   HPI obtained from medical chart review as no family in room and patient only able to provide limited answers.  72 year old female with PMH significant for former smoker, COPD with chronic bronchitis and chronic hypoxic and hypercarbic respiratory failure O2 dependent at 8L baseline, 10L with activity, and BiPAP nightly, diastolic HF, pulmonary hypertension, DM, Afib not on AC 2/2 prior GIB, HTN, morbid obesity who presented from home with confusion and worsening hypoxia and shortness of breath.  She is followed by Dr. Halford Chessman, last seen in office on 4/24 for hospital follow-up visit after being admitted 3/29 to 4/2 treated for HF and COPD exacerbation.  She was referred to palliative care in [facility] at that time.    Initial saturations of 77% on 8L [place].  She was placed on BIPAP with initial ABG of 7.097/ 63.8/ 70 /19.9 and admitted to [facility].  CXR shows RLL pneumonia vs atelectasis and small right pleural effusions.  She has been afebrile and hemodynamically stable.  Labs significant for WBC 11.1, Na 137, sCr 2.13 (1.02 in May), neg trop, BNP 1054, initial lactate of 7.59, improved to 1.5. Diuretics were held due to her AKI  and started on vancomycin and cefepime, and solumedrol.  Patient continues to require BiPAP.  Desaturated quickly this morning in trial off BiPAP.  Repeat ABG improved at 7.241/ 51/ 73.9/ 21.7.  She was given ativan for ongoing anxiety.  Vascular consulted for discoloration to bilateral feet in which patient appeared to be asymptomatic with.  Palliative care medicine additionally consulted to help with GOCs.  PCCM consulted to assist with any further pulmonary recommendations.   PAST MEDICAL HISTORY :   has a past medical history of Arthritis, CHF (congestive heart failure)  ([place]), COPD (chronic obstructive pulmonary disease) ([place]), Depression, Diabetes mellitus type 2 in obese ([place]), GERD (gastroesophageal reflux disease), Hyperlipidemia, Hypertension, Morbid obesity ([place]), OAB (overactive bladder), PSVT (paroxysmal supraventricular tachycardia) ([place]), Thyroid disease, and Vitamin D deficiency.  has a past surgical history that includes Carpal tunnel release; Eye surgery (Bilateral); Pubovaginal sling; Tonsillectomy and adenoidectomy; transthoracic echocardiogram (01/2011); NM MYOVIEW LTD (01/2011); TEE without cardioversion (N/A, 12/16/2016); and Colonoscopy (N/A, 08/19/2017). Prior to Admission medications   Medication Sig Start Date End Date Taking? Authorizing Provider  acetaminophen-codeine (TYLENOL #3) 300-30 MG tablet Take 1 tablet by mouth every 8 (eight) hours as needed for moderate pain or severe pain. 01/07/18  Yes [name], PA-C  acetaZOLAMIDE (DIAMOX) 250 MG tablet TAKE 1 TABLET (250 MG TOTAL) BY MOUTH 3 (THREE) TIMES DAILY. Patient taking differently: Take 250-500 mg by mouth See admin instructions. Take 500 mg in the morning and 250 mg in the evening 01/27/18  Yes Liane Comber, NP  albuterol (PROVENTIL HFA;VENTOLIN HFA) 108 (90 Base) MCG/ACT inhaler Inhale 1-2 puffs into the lungs every 6 (six) hours as needed for wheezing or shortness of breath. 05/30/17  Yes [name], PA-C  albuterol (PROVENTIL) (2.5 MG/3ML) 0.083% nebulizer solution Take 3 mLs (2.5 mg total) by nebulization every 6 (six) hours as needed for wheezing or shortness of breath. 11/10/17  Yes [name], PA-C  aspirin 81 MG tablet Take 81 mg by mouth daily.   Yes [provider]  atenolol (TENORMIN) 50 MG tablet TAKE 1 TABLET BY MOUTH EVERY  DAY 12/31/17  Yes [name], PA-C  budesonide (PULMICORT) 0.5 MG/2ML nebulizer solution Take 2 mLs (0.5 mg total) by nebulization 2 (two) times daily. 03/20/17  Yes [name], PA-C  Cholecalciferol (VITAMIN D PO) Take 2,000  Units by mouth daily.    Yes [provider]  diazepam (VALIUM) 5 MG tablet Take one tablet up to 2 x a daily for anxiety AS NEEDED 03/20/17  Yes [name], PA-C  diltiazem (CARDIZEM CD) 300 MG 24 hr capsule TAKE 1 CAPSULE (300 MG TOTAL) BY MOUTH DAILY. 06/21/17  Yes Unk Pinto, MD  DULoxetine (CYMBALTA) 60 MG capsule TAKE 1 CAPSULE BY MOUTH DAILY Patient taking differently: TAKE 60 mg  CAPSULE BY MOUTH DAILY 08/30/17  Yes Unk Pinto, MD  famotidine (PEPCID) 20 MG tablet Take 20 mg by mouth 2 (two) times daily. 12/29/17  Yes [provider]  fluticasone (FLONASE) 50 MCG/ACT nasal spray Place 2 sprays into both nostrils daily. Patient taking differently: Place 2 sprays into both nostrils daily as needed for allergies.  10/29/16  Yes Forcucci, Courtney, PA-C  furosemide (LASIX) 80 MG tablet Take 1 tablet (80 mg total) by mouth daily. 08/12/17  Yes Unk Pinto, MD  guaiFENesin (MUCINEX) 600 MG 12 hr tablet Take 600 mg by mouth 2 (two) times daily.    Yes [provider]  ipratropium (ATROVENT) 0.02 % nebulizer solution Take 2.5 mLs (0.5 mg total) by nebulization every 6 (six) hours. 08/20/17  Yes Lavina Hamman, MD  levothyroxine (SYNTHROID, LEVOTHROID) 175 MCG tablet TAKE 1 TABLET (175 MCG TOTAL) BY MOUTH DAILY BEFORE BREAKFAST. 12/31/17  Yes [name], PA-C  loratadine-pseudoephedrine (CLARITIN-D 24-HOUR) 10-240 MG per 24 hr tablet Take 1 tablet by mouth as needed for allergies.    Yes [provider]  Magnesium Chloride (MAGNESIUM DR PO) Take 1 capsule by mouth daily.    Yes [provider]  metFORMIN (GLUCOPHAGE-XR) 500 MG 24 hr tablet TAKE 1 TABLET (500 MG TOTAL) BY MOUTH 4 (FOUR) TIMES DAILY - AFTER MEALS AND AT BEDTIME. Patient taking differently: Take 1,000 mg by mouth 2 (two) times daily.  02/25/18  Yes Unk Pinto, MD  metolazone (ZAROXOLYN) 5 MG tablet TAKE 1 PILL 1 HOUR BEFORE THE LASIX DAILY AS NEEDED FOR WEIGHT  GAIN/EDEMA. 01/23/18  Yes Unk Pinto, MD  OVER THE COUNTER MEDICATION Slow-release iron 45 mg 2 tablets daily   Yes [provider]  OXYGEN-HELIUM IN Inhale 8 L into the lungs continuous.    Yes [provider]  polyethylene glycol (MIRALAX / GLYCOLAX) packet Take 17 g by mouth 2 (two) times daily. 08/20/17  Yes Lavina Hamman, MD  pravastatin (PRAVACHOL) 40 MG tablet TAKE 1 TABLET BY MOUTH EVERY DAY Patient taking differently: TAKE 40 mg TABLET BY MOUTH EVERY DAY 11/25/17  Yes Liane Comber, NP   Allergies  Allergen Reactions  . Inderal [Propranolol] Other (See Comments)    Hair loss  . Ace Inhibitors Cough    FAMILY HISTORY:  family history includes Alcohol abuse in her father; Heart disease in her father. SOCIAL HISTORY:  reports that she quit smoking about 19 months ago. Her smoking use included cigarettes. She has a 22.00 pack-year smoking history. She has never used smokeless tobacco. She reports that she does not drink alcohol or use drugs.  REVIEW OF SYSTEMS:   Unable to fully assess at this time as patient is on BiPAP and unable to answer all questions after receiving ativan.   SUBJECTIVE:  VITAL SIGNS: Temp:  [97.1 F (36.2 C)-98.6 F (37 C)] 97.7 F (36.5 C) (07/10 1258) Pulse Rate:  [84-107] 107 (07/10 1258) Resp:  [18-25] 22 (07/10 1258) BP: (105-134)/(51-89) 117/66 (07/10 1258) SpO2:  [90 %-95 %] 92 % (07/10 1258) FiO2 (%):  [90 %] 90 % (07/10 0913) Weight:  [232 lb (105.2 kg)] 232 lb (105.2 kg) (07/09 1751)  PHYSICAL EXAMINATION: General:  Chronically ill appearing elderly female lying in bed, in NAD, appears comfortable HEENT: pupils 3/reactive, well fitting mask Neuro: Opens eyes to voice, answers few questions and follows intermittent commands CV: IRIR, no m/r/g PULM: tachypneic but even/normal WOB on 12/6 BiPAP getting mostly 500 ml TV's, clear anteriorly, bibasilar rales- no wheezes, 94% on 90% GI: obese, soft, non-tender, hypo  active  Extremities: cool/dry, no edema, bilateral toes/feet with purplish discoloration- although palpable dp's Skin: no rashes   Recent Labs  Lab 03/29/2018 1750 03/16/2018 1755 03-28-2018 0712  NA 137 136 140  K 5.4* 5.3* 5.0  CL 99 102 101  CO2 20*  --  22  BUN 40* 41* 48*  CREATININE 2.13* 1.90* 2.25*  GLUCOSE 193* 188* 160*   Recent Labs  Lab 04/15/2018 1750 04/08/2018 1755 2018/03/28 0712  HGB 13.9 16.7* 12.9  HCT 50.3* 49.0* 45.6  WBC 11.1*  --  8.5  PLT 407*  --  312   Dg Chest Port 1 View  Result Date: 04/07/2018 CLINICAL DATA:  Shortness of breath. EXAM: PORTABLE CHEST 1 VIEW COMPARISON:  Chest x-ray dated December 13, 2017. FINDINGS: The patient's chin obscures the upper thorax. Stable cardiomegaly and diffuse interstitial coarsening. Patchy opacities at the right lung base with possible small right pleural effusion. No pneumothorax. No acute osseous abnormality. Prominent distention of the stomach. IMPRESSION: 1. Right lower lobe atelectasis versus pneumonia. Possible small right pleural effusion. 2. Stable cardiomegaly and pulmonary interstitial edema. Electronically Signed   By: Titus Dubin M.D.   On: 03/31/2018 18:16   SIGNIFICANT EVENTS  7/9 Admit  CULTURE:  7/10 RVP >> neg 7/09 MRSA PCR >> neg 7/09 BC >>  STUDIES:   ASSESSMENT / PLAN:  Chronic hypoxic and hypercarbic respiratory failure COPD/ chronic bronchitis Pulmonary HTN R/o RLL PNA, small R pleural effusion  P: DNR  Agree and appreciate PMT for further GOC consult, prognosis appears poor Continue BiPAP PRN and mandatory HS as needed for increased WOB/ comfort Wean O2 to baseline of 8 -10 L [place] if able Consider HFNC if improves for trial off BiPAP NPO while on BiPAP Minimize sedation as able while on BiPAP Assess PCT, if neg consider d/c Unasyn if remains afebrile/ normal WBC Urine strep negative and legionella pending Duonebs q6 hr and albuterol q2 hr PRN  Add budesonide BID Solumedrol 80 mg q  8hr - can likely taper, no wheeze D/c droplet precautions- RVP neg Gentle hydration   Remainder per primary team   PCCM will continue to follow.    Kennieth Rad, AGACNP-BC [facility] Pgr: [phone] or if no answer [phone] 28-Mar-2018, 2:21 PM

## 2018-04-16 NOTE — Consult Note (Addendum)
Hospital Consult    Reason for Consult:  PVD, blue toes Requesting Physician:  Dr. Sarajane Jews MRN #:  [id number]  History of Present Illness: This is a 72 y.o. female with end-stage COPD, hypertension, hyperlipidemia, type 2 diabetes mellitus, and atrial fibrillation not anticoagulated due to history of GI bleed was admitted to the hospital for acute respiratory failure as well as likely acute renal failure.  This is a DNR patient currently unable to be weaned from BiPAP, unable to provide history, and no family present.  Vascular surgery was consulted to assess blue toes noted on bilateral feet.  Only history able to be obtained from chart includes ABIs performed in October 2016 which demonstrate bilateral ABIs within normal limits.  She does not seem to be in any pain caused by bilateral lower extremities based on physical exam.  She has discoloration of bilateral feet which is blanchable and improves with elevation.  She has a long pack year history however based on her chart, patient quit smoking in 2017.  She is taking aspirin 81 mg.    Past Medical History:  Diagnosis Date  . Arthritis   . CHF (congestive heart failure) ([place])   . COPD (chronic obstructive pulmonary disease) ([place])   . Depression   . Diabetes mellitus type 2 in obese ([place])   . GERD (gastroesophageal reflux disease)   . Hyperlipidemia   . Hypertension   . Morbid obesity ([place])   . OAB (overactive bladder)   . PSVT (paroxysmal supraventricular tachycardia) ([place])   . Thyroid disease   . Vitamin D deficiency     Past Surgical History:  Procedure Laterality Date  . CARPAL TUNNEL RELEASE     L 1992 R 1984  . COLONOSCOPY N/A 08/19/2017   Procedure: COLONOSCOPY;  Surgeon: Milus Banister, MD;  [facility];  Service: Endoscopy;  Laterality: N/A;  . EYE SURGERY Bilateral    cataract  . NM MYOVIEW LTD  01/2011   Dobutamine Myoview: Negative perfusion scan for ischemia / infarct (poor image capture); Pt developed  SVT with Dobutamine that reproduced her CP as it resolved with restoration of NSR.  Marland Kitchen PUBOVAGINAL SLING    . TEE WITHOUT CARDIOVERSION N/A 12/16/2016   Procedure: TRANSESOPHAGEAL ECHOCARDIOGRAM (TEE);  Surgeon: Sanda Klein, MD;  Location[facility] ENDOSCOPY;  Service: Cardiovascular;  Laterality: N/A;  . TONSILLECTOMY AND ADENOIDECTOMY    . TRANSTHORACIC ECHOCARDIOGRAM  01/2011   Hyperdynamic LV with EF 65-70%, Gr 1 DD, Mild Ao Stenosis - mean gradient ~19 mmHg    Allergies  Allergen Reactions  . Inderal [Propranolol] Other (See Comments)    Hair loss  . Ace Inhibitors Cough    Prior to Admission medications   Medication Sig Start Date End Date Taking? Authorizing Provider  acetaminophen-codeine (TYLENOL #3) 300-30 MG tablet Take 1 tablet by mouth every 8 (eight) hours as needed for moderate pain or severe pain. 01/07/18  Yes [name], PA-C  acetaZOLAMIDE (DIAMOX) 250 MG tablet TAKE 1 TABLET (250 MG TOTAL) BY MOUTH 3 (THREE) TIMES DAILY. Patient taking differently: Take 250-500 mg by mouth See admin instructions. Take 500 mg in the morning and 250 mg in the evening 01/27/18  Yes Liane Comber, NP  albuterol (PROVENTIL HFA;VENTOLIN HFA) 108 (90 Base) MCG/ACT inhaler Inhale 1-2 puffs into the lungs every 6 (six) hours as needed for wheezing or shortness of breath. 05/30/17  Yes [name], PA-C  albuterol (PROVENTIL) (2.5 MG/3ML) 0.083% nebulizer solution Take 3 mLs (2.5 mg total) by  nebulization every 6 (six) hours as needed for wheezing or shortness of breath. 11/10/17  Yes [name], PA-C  aspirin 81 MG tablet Take 81 mg by mouth daily.   Yes [provider]  atenolol (TENORMIN) 50 MG tablet TAKE 1 TABLET BY MOUTH EVERY DAY 12/31/17  Yes [name], PA-C  budesonide (PULMICORT) 0.5 MG/2ML nebulizer solution Take 2 mLs (0.5 mg total) by nebulization 2 (two) times daily. 03/20/17  Yes [name], PA-C  Cholecalciferol (VITAMIN D PO) Take 2,000 Units by mouth daily.     Yes [provider]  diazepam (VALIUM) 5 MG tablet Take one tablet up to 2 x a daily for anxiety AS NEEDED 03/20/17  Yes [name], PA-C  diltiazem (CARDIZEM CD) 300 MG 24 hr capsule TAKE 1 CAPSULE (300 MG TOTAL) BY MOUTH DAILY. 06/21/17  Yes Unk Pinto, MD  DULoxetine (CYMBALTA) 60 MG capsule TAKE 1 CAPSULE BY MOUTH DAILY Patient taking differently: TAKE 60 mg  CAPSULE BY MOUTH DAILY 08/30/17  Yes Unk Pinto, MD  famotidine (PEPCID) 20 MG tablet Take 20 mg by mouth 2 (two) times daily. 12/29/17  Yes [provider]  fluticasone (FLONASE) 50 MCG/ACT nasal spray Place 2 sprays into both nostrils daily. Patient taking differently: Place 2 sprays into both nostrils daily as needed for allergies.  10/29/16  Yes Forcucci, Courtney, PA-C  furosemide (LASIX) 80 MG tablet Take 1 tablet (80 mg total) by mouth daily. 08/12/17  Yes Unk Pinto, MD  guaiFENesin (MUCINEX) 600 MG 12 hr tablet Take 600 mg by mouth 2 (two) times daily.    Yes [provider]  ipratropium (ATROVENT) 0.02 % nebulizer solution Take 2.5 mLs (0.5 mg total) by nebulization every 6 (six) hours. 08/20/17  Yes Lavina Hamman, MD  levothyroxine (SYNTHROID, LEVOTHROID) 175 MCG tablet TAKE 1 TABLET (175 MCG TOTAL) BY MOUTH DAILY BEFORE BREAKFAST. 12/31/17  Yes [name], PA-C  loratadine-pseudoephedrine (CLARITIN-D 24-HOUR) 10-240 MG per 24 hr tablet Take 1 tablet by mouth as needed for allergies.    Yes [provider]  Magnesium Chloride (MAGNESIUM DR PO) Take 1 capsule by mouth daily.    Yes [provider]  metFORMIN (GLUCOPHAGE-XR) 500 MG 24 hr tablet TAKE 1 TABLET (500 MG TOTAL) BY MOUTH 4 (FOUR) TIMES DAILY - AFTER MEALS AND AT BEDTIME. Patient taking differently: Take 1,000 mg by mouth 2 (two) times daily.  02/25/18  Yes Unk Pinto, MD  metolazone (ZAROXOLYN) 5 MG tablet TAKE 1 PILL 1 HOUR BEFORE THE LASIX DAILY AS NEEDED FOR WEIGHT GAIN/EDEMA. 01/23/18  Yes  Unk Pinto, MD  OVER THE COUNTER MEDICATION Slow-release iron 45 mg 2 tablets daily   Yes [provider]  OXYGEN-HELIUM IN Inhale 8 L into the lungs continuous.    Yes [provider]  polyethylene glycol (MIRALAX / GLYCOLAX) packet Take 17 g by mouth 2 (two) times daily. 08/20/17  Yes Lavina Hamman, MD  pravastatin (PRAVACHOL) 40 MG tablet TAKE 1 TABLET BY MOUTH EVERY DAY Patient taking differently: TAKE 40 mg TABLET BY MOUTH EVERY DAY 11/25/17  Yes Liane Comber, NP    Social History   Socioeconomic History  . Marital status: Widowed    Spouse name: Not on file  . Number of children: Not on file  . Years of education: Not on file  . Highest education level: Not on file  Occupational History  . Occupation: retired  Scientific laboratory technician  . Financial resource strain: Not on file  . Food  insecurity:    Worry: Not on file    Inability: Not on file  . Transportation needs:    Medical: Not on file    Non-medical: Not on file  Tobacco Use  . Smoking status: Former Smoker    Packs/day: 0.50    Years: 44.00    Pack years: 22.00    Types: Cigarettes    Last attempt to quit: 07/31/2016    Years since quitting: 1.6  . Smokeless tobacco: Never Used  Substance and Sexual Activity  . Alcohol use: No    Alcohol/week: 0.0 oz  . Drug use: No  . Sexual activity: Not on file  Lifestyle  . Physical activity:    Days per week: Not on file    Minutes per session: Not on file  . Stress: Not on file  Relationships  . Social connections:    Talks on phone: Not on file    Gets together: Not on file    Attends religious service: Not on file    Active member of club or organization: Not on file    Attends meetings of clubs or organizations: Not on file    Relationship status: Not on file  . Intimate partner violence:    Fear of current or ex partner: Not on file    Emotionally abused: Not on file    Physically abused: Not on file    Forced sexual activity: Not on file    Other Topics Concern  . Not on file  Social History Narrative  . Not on file     Family History  Problem Relation Age of Onset  . Alcohol abuse Father   . Heart disease Father     ROS: Otherwise negative unless mentioned in HPI  Physical Examination  Vitals:   2018-04-01 0756 April 01, 2018 0913  BP: 119/84 105/66  Pulse: (!) 106 (!) 104  Resp: (!) 22 19  Temp: 98 F (36.7 C)   SpO2: 92% 95%   Body mass index is 36.34 kg/m.  General:  Patient not participating during physical exam and not responding to verbal que Gait: Not observed HENT: WNL, normocephalic Pulmonary: BiPAP Cardiac: irregular  Abdomen: soft, NT/ND, no masses Skin: without rashes Vascular Exam/Pulses: unable to assess femoral pulses due to large body habitus and position of patient Extremities: blanchable discoloration which improves with manipulation and elevation of BLE; R ATA and PTA by doppler; L ATA by doppler and soft PTA Musculoskeletal: no muscle wasting or atrophy  Neurologic: eyes closed and not following commands; difficult to assess Lymph:  Unremarkable  CBC    Component Value Date/Time   WBC 8.5 2018-04-01 0712   RBC 5.03 04/01/18 0712   HGB 12.9 04/01/18 0712   HCT 45.6 2018/04/01 0712   PLT 312 Apr 01, 2018 0712   MCV 90.7 2018-04-01 0712   MCH 25.6 (L) 2018-04-01 0712   MCHC 28.3 (L) 04-01-18 0712   RDW 16.2 (H) April 01, 2018 0712   LYMPHSABS 0.9 04/04/2018 1750   MONOABS 0.8 03/30/2018 1750   EOSABS 0.1 04/13/2018 1750   BASOSABS 0.0 03/28/2018 1750    BMET    Component Value Date/Time   NA 140 04/01/2018 0712   K 5.0 01-Apr-2018 0712   CL 101 04/01/18 0712   CO2 22 01-Apr-2018 0712   GLUCOSE 160 (H) 04/01/2018 0712   BUN 48 (H) 04-01-2018 0712   CREATININE 2.25 (H) 2018-04-01 0712   CREATININE 1.02 (H) 02/11/2018 1512   CALCIUM 9.2 01-Apr-2018 2694  GFRNONAA 21 (L) 04-21-2018 0712   GFRNONAA 55 (L) 02/11/2018 1512   GFRAA 24 (L) 2018-04-21 0712   GFRAA 64  02/11/2018 1512    COAGS: Lab Results  Component Value Date   INR 1.0 10/02/2017   INR 2.3 (H) 09/02/2017   INR 1.10 08/20/2017     Non-Invasive Vascular Imaging:   06/2015: ABIs wnl  Statin:  No. Beta Blocker:  Yes.   Aspirin:  Yes.   ACEI:  No. ARB:  No. CCB use:  No Other antiplatelets/anticoagulants:  No.    ASSESSMENT/PLAN: This is a 72 y.o. female with multiple medical conditions found to have discolored feet bilaterally  - Unable to obtain history and no family present - Patient does appear to be adequately perfusing BLE given peripheral doppler signals; discoloration likely venous congestion based on physical exam findings - No emergent indication for revascularization at this time; if patient's condition improves we can consider further workup for PAD - On-call vascular surgeon Dr. Oneida Alar will evaluate the patient later today   Dagoberto Ligas PA-C [facility] [phone]   Agree with above.  Incidental finding of dusky toes.  Has doppler flow to both feet.  Severe pulmonary issues.  Not a candidate for any vascular intervention.  Has been made comfort care at this point.  Will sign off.  Ruta Hinds, MD [facility] [place] Office: [phone] Pager: [phone]

## 2018-04-16 NOTE — Progress Notes (Signed)
Comfort care measures were established per provider order with consent of family.  Dilaudid gtt and iv lorazepam PRN given, bipap converted to O2 via [place].  Aggressive care measures were d/c'ed as ordered.  Compassionate care cart was ordered. Pt received several dilaudid boluses and is now resting peacefully with family at bedside.

## 2018-04-16 DEATH — deceased

## 2018-05-21 ENCOUNTER — Ambulatory Visit: Payer: Self-pay | Admitting: Adult Health

## 2018-08-28 IMAGING — DX DG CHEST 1V PORT
1 series · 1 of 1 positions shown · non-contrast
Comparison: 12/09/2016, 11/11/2016, 07/31/2016

CLINICAL DATA: Shortness of breath

EXAM:
PORTABLE CHEST 1 VIEW

[chest ap]
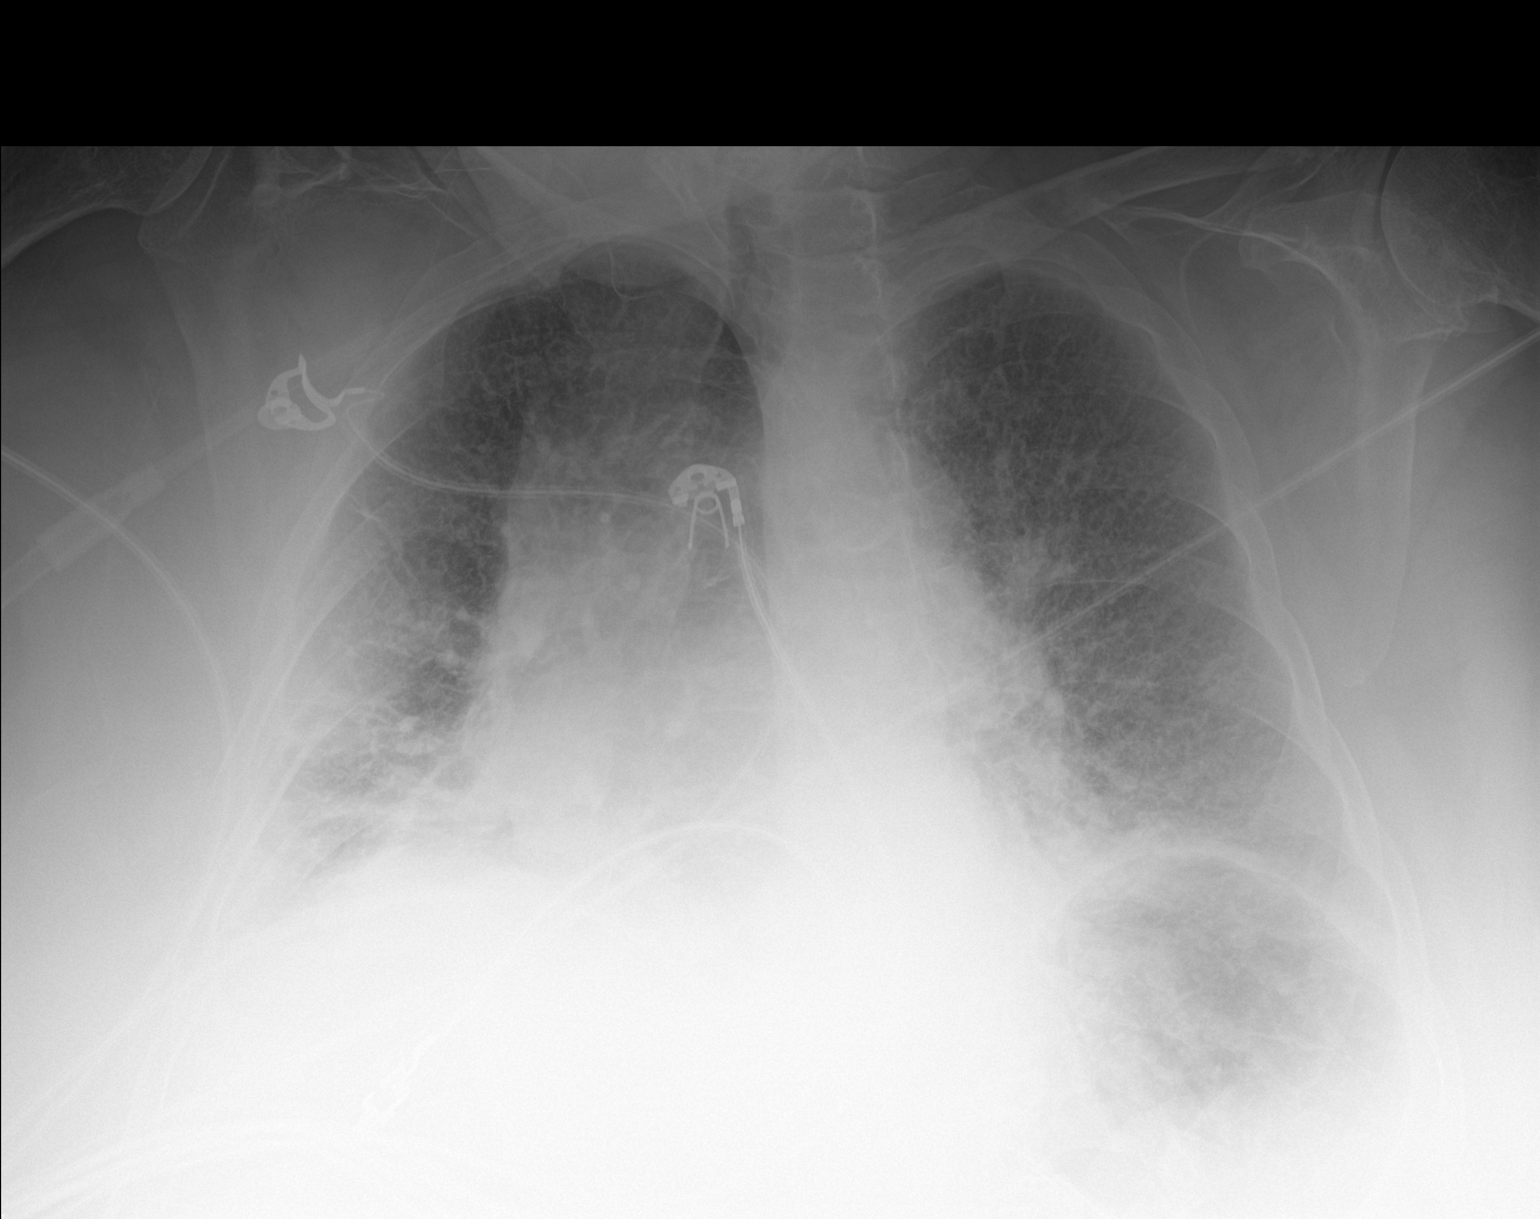

[1 of 1 positions shown; findings below may reference images not displayed]

FINDINGS: Rotated patient. Low lung volumes. Enlarged cardiomediastinal
silhouette with vascular congestion and diffuse interstitial edema.
Bibasilar infiltrates. Increased right hilar opacity similar
compared to 2077 study, more prominent compared to 7575. No
pneumothorax
IMPRESSION: 1. Low lung volumes
2. Cardiomegaly with vascular congestion and diffuse interstitial
edema
3. Right hilar opacity could be secondary to enlarged pulmonary
vessels although mass is also considered. Correlation with contrast
enhanced CT is suggested.

## 2018-09-01 ENCOUNTER — Ambulatory Visit: Payer: Self-pay | Admitting: Internal Medicine

## 2018-12-27 IMAGING — DX DG CHEST 1V PORT
1 series · 1 of 1 positions shown · non-contrast
Comparison: Chest x-rays dated 08/15/2017, 11/11/2016 and
07/31/2016.

CLINICAL DATA: Shortness of breath, fluid retention. History of
CHF, COPD, diabetes, hypertension. Former smoker.

EXAM:
PORTABLE CHEST 1 VIEW

[chest]
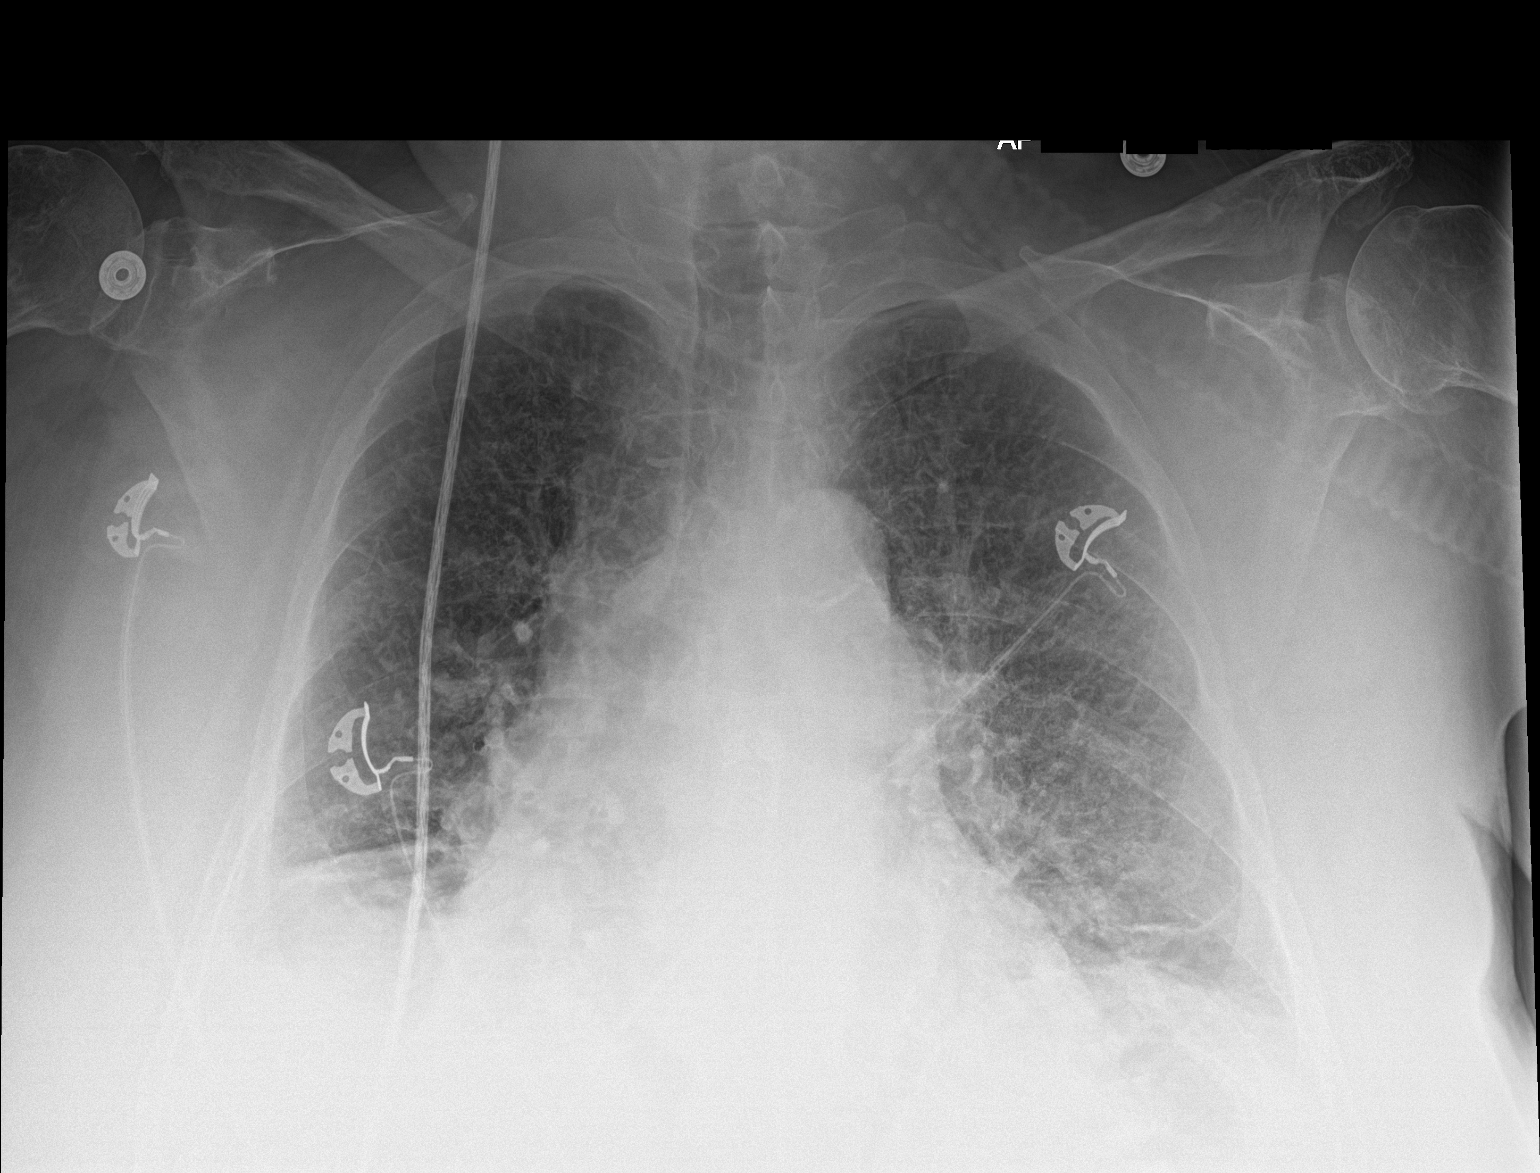

[1 of 1 positions shown; findings below may reference images not displayed]

FINDINGS: Stable cardiomegaly. Aortic atherosclerosis. Diffuse interstitial
prominence is stable. Additional patchy opacities at the lung bases
are not significantly changed, presumed chronic atelectasis and/or
pleural thickening. No pleural effusion or pneumothorax seen.
IMPRESSION: 1. Stable interstitial prominence, similar to multiple previous
chest x-rays suggesting chronic interstitial lung disease. No
evidence of active CHF/pulmonary edema.
2. Stable bibasilar opacities, most likely chronic atelectasis
and/or pleural thickening. No evidence of pneumonia or pleural
effusion.
3. Stable cardiomegaly.
4. Aortic atherosclerosis.

## 2018-12-28 IMAGING — DX DG CHEST 1V PORT
1 series · 1 of 1 positions shown · non-contrast
Comparison: Yesterday

CLINICAL DATA: Respiratory failure

EXAM:
PORTABLE CHEST 1 VIEW

[chest ap]
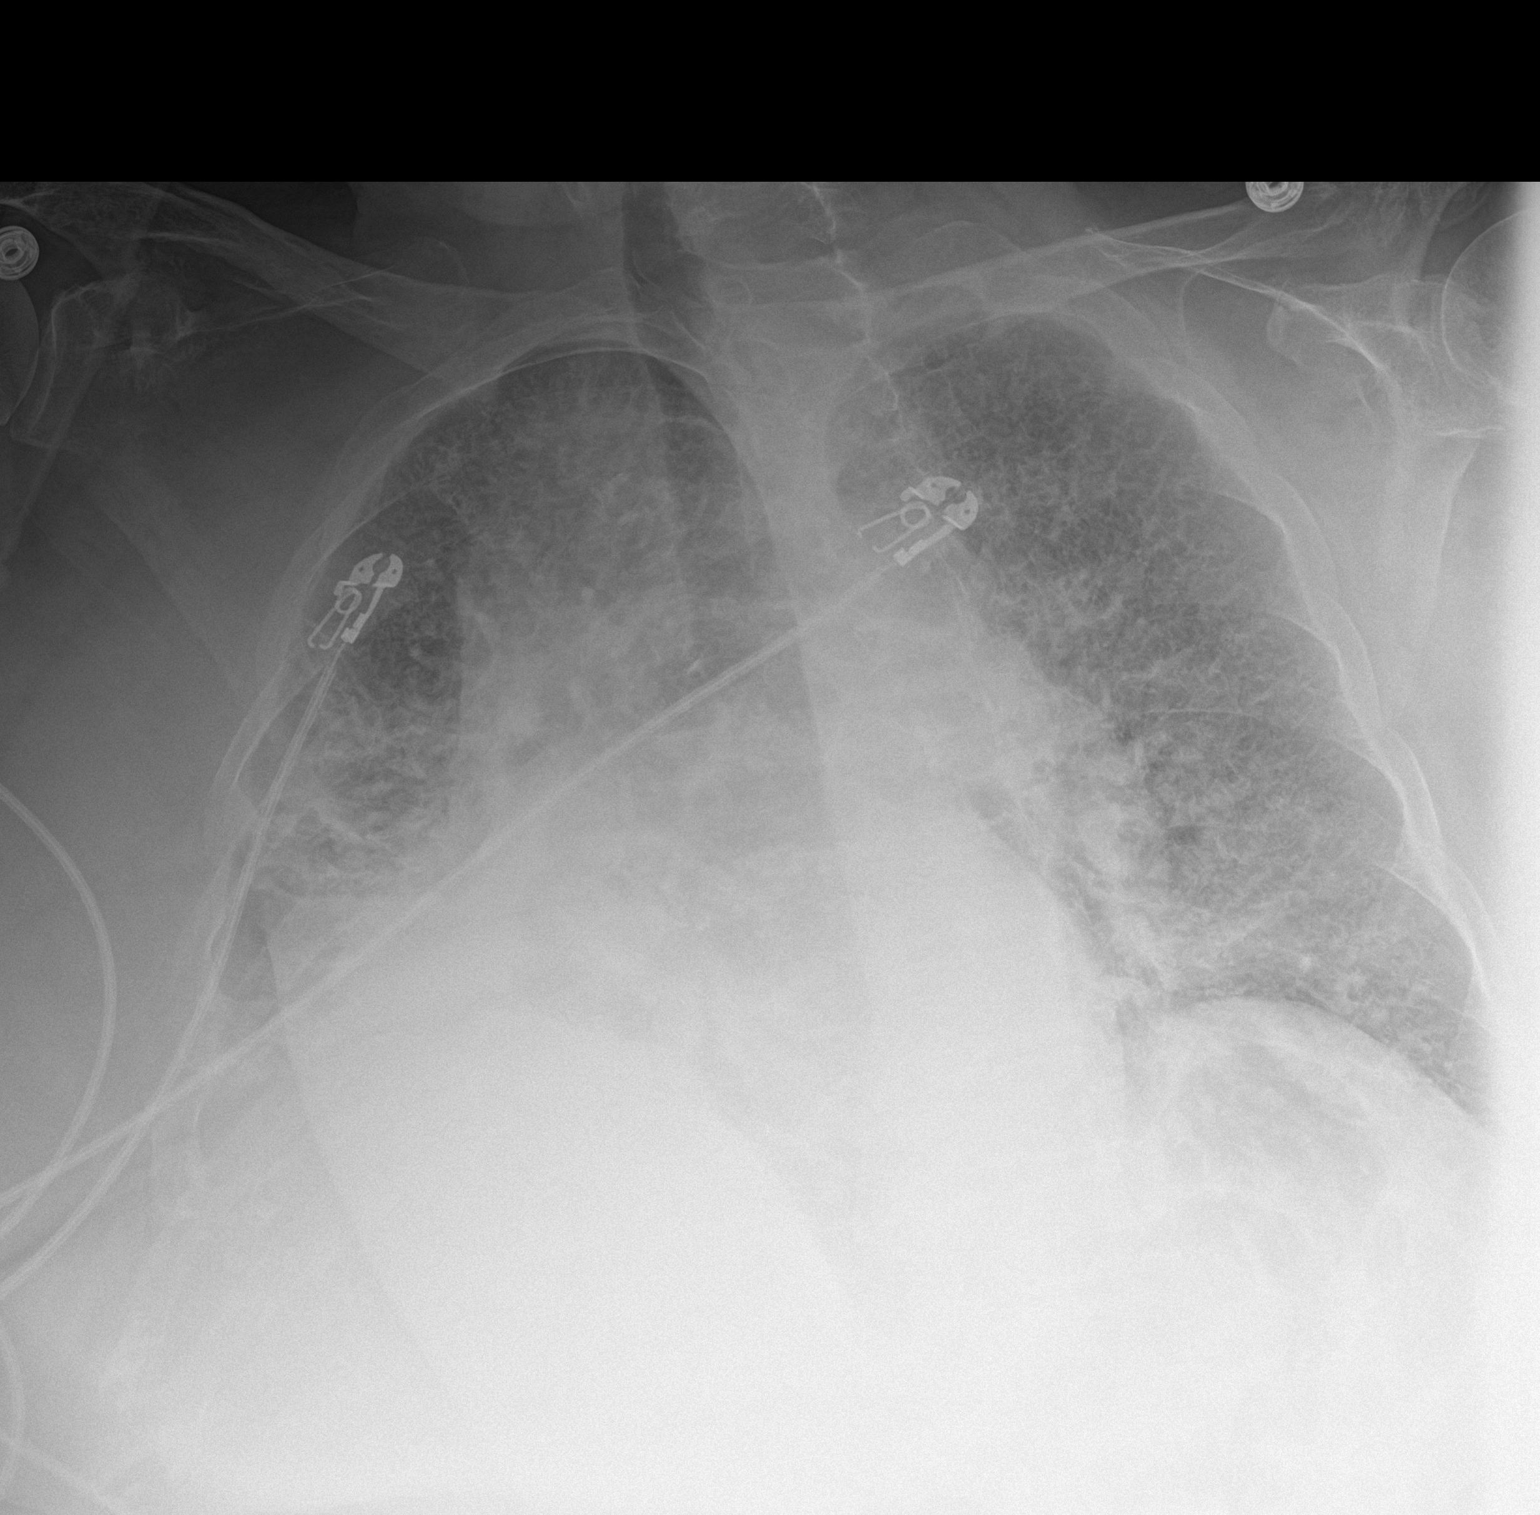

[1 of 1 positions shown; findings below may reference images not displayed]

FINDINGS: Low lung volumes with diffuse interstitial coarsening. Asymmetric
right more than left lower lung opacity, worsened on the right.
Cardiomegaly. Mediastinal contours are distorted by marked rightward
rotation.
IMPRESSION: Low volume chest with increased atelectasis or pneumonia at the
right more than left base.

Increased interstitial coarsening suggesting edema.

## 2019-03-22 ENCOUNTER — Encounter: Payer: Self-pay | Admitting: Internal Medicine

## 2019-03-25 ENCOUNTER — Encounter: Payer: Self-pay | Admitting: Internal Medicine
